# Patient Record
Sex: Female | Born: 1970 | Race: Black or African American | Hispanic: No | Marital: Single | Smoking: Never smoker
Health system: Southern US, Community
[De-identification: ages and names within clinical notes are randomized; demographics above are authoritative.]

## PROBLEM LIST (undated history)

## (undated) DIAGNOSIS — D869 Sarcoidosis, unspecified: Secondary | ICD-10-CM

## (undated) DIAGNOSIS — M797 Fibromyalgia: Secondary | ICD-10-CM

## (undated) DIAGNOSIS — D649 Anemia, unspecified: Secondary | ICD-10-CM

## (undated) DIAGNOSIS — I639 Cerebral infarction, unspecified: Secondary | ICD-10-CM

## (undated) DIAGNOSIS — M1990 Unspecified osteoarthritis, unspecified site: Secondary | ICD-10-CM

## (undated) DIAGNOSIS — I4581 Long QT syndrome: Secondary | ICD-10-CM

## (undated) DIAGNOSIS — E1140 Type 2 diabetes mellitus with diabetic neuropathy, unspecified: Secondary | ICD-10-CM

## (undated) DIAGNOSIS — D259 Leiomyoma of uterus, unspecified: Secondary | ICD-10-CM

## (undated) DIAGNOSIS — M329 Systemic lupus erythematosus, unspecified: Secondary | ICD-10-CM

## (undated) DIAGNOSIS — IMO0002 Reserved for concepts with insufficient information to code with codable children: Secondary | ICD-10-CM

## (undated) DIAGNOSIS — K222 Esophageal obstruction: Secondary | ICD-10-CM

## (undated) DIAGNOSIS — I10 Essential (primary) hypertension: Secondary | ICD-10-CM

## (undated) DIAGNOSIS — K219 Gastro-esophageal reflux disease without esophagitis: Secondary | ICD-10-CM

## (undated) DIAGNOSIS — Z95810 Presence of automatic (implantable) cardiac defibrillator: Secondary | ICD-10-CM

## (undated) DIAGNOSIS — J40 Bronchitis, not specified as acute or chronic: Secondary | ICD-10-CM

## (undated) DIAGNOSIS — G40909 Epilepsy, unspecified, not intractable, without status epilepticus: Secondary | ICD-10-CM

## (undated) DIAGNOSIS — H5789 Other specified disorders of eye and adnexa: Secondary | ICD-10-CM

## (undated) DIAGNOSIS — T7840XA Allergy, unspecified, initial encounter: Secondary | ICD-10-CM

## (undated) DIAGNOSIS — K648 Other hemorrhoids: Secondary | ICD-10-CM

## (undated) DIAGNOSIS — R1033 Periumbilical pain: Secondary | ICD-10-CM

## (undated) DIAGNOSIS — F419 Anxiety disorder, unspecified: Secondary | ICD-10-CM

## (undated) DIAGNOSIS — E119 Type 2 diabetes mellitus without complications: Secondary | ICD-10-CM

## (undated) DIAGNOSIS — R002 Palpitations: Secondary | ICD-10-CM

## (undated) DIAGNOSIS — Z9289 Personal history of other medical treatment: Secondary | ICD-10-CM

## (undated) DIAGNOSIS — J449 Chronic obstructive pulmonary disease, unspecified: Secondary | ICD-10-CM

## (undated) DIAGNOSIS — C801 Malignant (primary) neoplasm, unspecified: Secondary | ICD-10-CM

## (undated) HISTORY — DX: Type 2 diabetes mellitus without complications: E11.9

## (undated) HISTORY — DX: Chronic obstructive pulmonary disease, unspecified: J44.9

## (undated) HISTORY — DX: Leiomyoma of uterus, unspecified: D25.9

## (undated) HISTORY — DX: Esophageal obstruction: K22.2

## (undated) HISTORY — PX: TUBAL LIGATION: SHX77

## (undated) HISTORY — DX: Essential (primary) hypertension: I10

## (undated) HISTORY — DX: Anxiety disorder, unspecified: F41.9

## (undated) HISTORY — DX: Fibromyalgia: M79.7

## (undated) HISTORY — PX: SALPINGECTOMY: SHX328

## (undated) HISTORY — DX: Epilepsy, unspecified, not intractable, without status epilepticus: G40.909

## (undated) HISTORY — DX: Gastro-esophageal reflux disease without esophagitis: K21.9

## (undated) HISTORY — DX: Sarcoidosis, unspecified: D86.9

## (undated) HISTORY — PX: OTHER SURGICAL HISTORY: SHX169

## (undated) HISTORY — DX: Bronchitis, not specified as acute or chronic: J40

## (undated) HISTORY — DX: Long QT syndrome: I45.81

## (undated) HISTORY — DX: Anemia, unspecified: D64.9

## (undated) HISTORY — DX: Reserved for concepts with insufficient information to code with codable children: IMO0002

## (undated) HISTORY — DX: Allergy, unspecified, initial encounter: T78.40XA

## (undated) HISTORY — DX: Systemic lupus erythematosus, unspecified: M32.9

## (undated) HISTORY — DX: Periumbilical pain: R10.33

## (undated) HISTORY — DX: Palpitations: R00.2

## (undated) HISTORY — PX: HAND SURGERY: SHX662

## (undated) HISTORY — PX: CERVIX REMOVAL: SHX592

## (undated) SURGERY — BRONCHOSCOPY, WITH FLUOROSCOPY
Anesthesia: Moderate Sedation | Laterality: Bilateral

---

## 1998-09-03 ENCOUNTER — Other Ambulatory Visit: Admission: RE | Admit: 1998-09-03 | Discharge: 1998-09-03 | Payer: Self-pay | Admitting: Obstetrics

## 2000-06-04 ENCOUNTER — Emergency Department (HOSPITAL_COMMUNITY): Admission: EM | Admit: 2000-06-04 | Discharge: 2000-06-04 | Payer: Self-pay | Admitting: Emergency Medicine

## 2001-06-14 ENCOUNTER — Encounter: Payer: Self-pay | Admitting: Obstetrics

## 2001-06-14 ENCOUNTER — Encounter: Admission: RE | Admit: 2001-06-14 | Discharge: 2001-06-14 | Payer: Self-pay | Admitting: Obstetrics

## 2002-02-27 ENCOUNTER — Ambulatory Visit (HOSPITAL_COMMUNITY): Admission: RE | Admit: 2002-02-27 | Discharge: 2002-02-27 | Payer: Self-pay | Admitting: Internal Medicine

## 2002-02-27 ENCOUNTER — Encounter: Payer: Self-pay | Admitting: Internal Medicine

## 2002-03-09 ENCOUNTER — Ambulatory Visit: Admission: RE | Admit: 2002-03-09 | Discharge: 2002-03-09 | Payer: Self-pay | Admitting: Critical Care Medicine

## 2002-03-09 ENCOUNTER — Encounter (INDEPENDENT_AMBULATORY_CARE_PROVIDER_SITE_OTHER): Payer: Self-pay | Admitting: *Deleted

## 2002-03-09 ENCOUNTER — Encounter (INDEPENDENT_AMBULATORY_CARE_PROVIDER_SITE_OTHER): Payer: Self-pay | Admitting: Specialist

## 2002-05-04 ENCOUNTER — Ambulatory Visit (HOSPITAL_COMMUNITY): Admission: RE | Admit: 2002-05-04 | Discharge: 2002-05-04 | Payer: Self-pay | Admitting: Critical Care Medicine

## 2002-05-04 ENCOUNTER — Encounter: Payer: Self-pay | Admitting: Critical Care Medicine

## 2003-04-05 ENCOUNTER — Encounter: Admission: RE | Admit: 2003-04-05 | Discharge: 2003-04-05 | Payer: Self-pay | Admitting: Obstetrics

## 2003-05-19 DIAGNOSIS — K219 Gastro-esophageal reflux disease without esophagitis: Secondary | ICD-10-CM

## 2003-05-19 HISTORY — DX: Gastro-esophageal reflux disease without esophagitis: K21.9

## 2003-06-08 ENCOUNTER — Ambulatory Visit (HOSPITAL_COMMUNITY): Admission: RE | Admit: 2003-06-08 | Discharge: 2003-06-08 | Payer: Self-pay | Admitting: Critical Care Medicine

## 2003-12-08 ENCOUNTER — Emergency Department (HOSPITAL_COMMUNITY): Admission: EM | Admit: 2003-12-08 | Discharge: 2003-12-08 | Payer: Self-pay | Admitting: Emergency Medicine

## 2004-02-05 ENCOUNTER — Encounter: Payer: Self-pay | Admitting: Gastroenterology

## 2004-02-05 DIAGNOSIS — K209 Esophagitis, unspecified without bleeding: Secondary | ICD-10-CM | POA: Insufficient documentation

## 2004-02-05 DIAGNOSIS — K222 Esophageal obstruction: Secondary | ICD-10-CM | POA: Insufficient documentation

## 2004-03-19 ENCOUNTER — Ambulatory Visit: Payer: Self-pay | Admitting: Critical Care Medicine

## 2004-03-20 ENCOUNTER — Ambulatory Visit: Payer: Self-pay | Admitting: Critical Care Medicine

## 2004-05-18 HISTORY — PX: OTHER SURGICAL HISTORY: SHX169

## 2004-10-06 ENCOUNTER — Ambulatory Visit: Payer: Self-pay | Admitting: Gastroenterology

## 2004-10-07 ENCOUNTER — Ambulatory Visit (HOSPITAL_COMMUNITY): Admission: RE | Admit: 2004-10-07 | Discharge: 2004-10-07 | Payer: Self-pay | Admitting: Gastroenterology

## 2004-12-08 ENCOUNTER — Ambulatory Visit: Payer: Self-pay | Admitting: Gastroenterology

## 2004-12-11 ENCOUNTER — Ambulatory Visit: Payer: Self-pay | Admitting: Gastroenterology

## 2005-01-05 ENCOUNTER — Ambulatory Visit: Payer: Self-pay | Admitting: Critical Care Medicine

## 2005-01-20 ENCOUNTER — Ambulatory Visit: Payer: Self-pay | Admitting: Gastroenterology

## 2005-02-09 ENCOUNTER — Emergency Department (HOSPITAL_COMMUNITY): Admission: EM | Admit: 2005-02-09 | Discharge: 2005-02-09 | Payer: Self-pay | Admitting: Emergency Medicine

## 2005-02-27 ENCOUNTER — Ambulatory Visit: Payer: Self-pay | Admitting: Internal Medicine

## 2005-03-17 ENCOUNTER — Ambulatory Visit: Payer: Self-pay | Admitting: Internal Medicine

## 2005-03-20 ENCOUNTER — Inpatient Hospital Stay (HOSPITAL_COMMUNITY): Admission: RE | Admit: 2005-03-20 | Discharge: 2005-03-21 | Payer: Self-pay | Admitting: Internal Medicine

## 2005-03-21 ENCOUNTER — Ambulatory Visit: Payer: Self-pay | Admitting: Cardiology

## 2005-04-15 ENCOUNTER — Ambulatory Visit: Payer: Self-pay

## 2005-05-19 ENCOUNTER — Ambulatory Visit (HOSPITAL_COMMUNITY): Admission: RE | Admit: 2005-05-19 | Discharge: 2005-05-19 | Payer: Self-pay | Admitting: Orthopedic Surgery

## 2005-06-05 ENCOUNTER — Ambulatory Visit: Payer: Self-pay | Admitting: Internal Medicine

## 2005-06-05 ENCOUNTER — Ambulatory Visit (HOSPITAL_COMMUNITY): Admission: RE | Admit: 2005-06-05 | Discharge: 2005-06-05 | Payer: Self-pay | Admitting: Internal Medicine

## 2005-06-24 ENCOUNTER — Ambulatory Visit (HOSPITAL_COMMUNITY): Admission: RE | Admit: 2005-06-24 | Discharge: 2005-06-24 | Payer: Self-pay | Admitting: Orthopedic Surgery

## 2005-07-07 ENCOUNTER — Ambulatory Visit: Payer: Self-pay | Admitting: Internal Medicine

## 2005-07-28 ENCOUNTER — Ambulatory Visit: Payer: Self-pay | Admitting: Pulmonary Disease

## 2005-08-20 ENCOUNTER — Ambulatory Visit: Payer: Self-pay | Admitting: Critical Care Medicine

## 2005-09-03 ENCOUNTER — Encounter: Admission: RE | Admit: 2005-09-03 | Discharge: 2005-12-02 | Payer: Self-pay | Admitting: Orthopedic Surgery

## 2005-09-08 ENCOUNTER — Encounter: Admission: RE | Admit: 2005-09-08 | Discharge: 2005-09-08 | Payer: Self-pay | Admitting: Internal Medicine

## 2005-09-21 ENCOUNTER — Ambulatory Visit: Payer: Self-pay | Admitting: Gastroenterology

## 2005-09-30 ENCOUNTER — Ambulatory Visit: Payer: Self-pay | Admitting: Critical Care Medicine

## 2005-10-07 ENCOUNTER — Encounter (INDEPENDENT_AMBULATORY_CARE_PROVIDER_SITE_OTHER): Payer: Self-pay | Admitting: *Deleted

## 2005-10-07 ENCOUNTER — Ambulatory Visit (HOSPITAL_COMMUNITY): Admission: RE | Admit: 2005-10-07 | Discharge: 2005-10-07 | Payer: Self-pay | Admitting: Obstetrics

## 2005-11-05 ENCOUNTER — Ambulatory Visit: Payer: Self-pay

## 2005-11-16 ENCOUNTER — Encounter: Admission: RE | Admit: 2005-11-16 | Discharge: 2005-11-16 | Payer: Self-pay | Admitting: Neurology

## 2005-11-25 ENCOUNTER — Ambulatory Visit: Payer: Self-pay | Admitting: Internal Medicine

## 2005-12-08 ENCOUNTER — Ambulatory Visit: Payer: Self-pay | Admitting: Critical Care Medicine

## 2006-02-09 ENCOUNTER — Emergency Department (HOSPITAL_COMMUNITY): Admission: EM | Admit: 2006-02-09 | Discharge: 2006-02-09 | Payer: Self-pay | Admitting: Podiatry

## 2006-02-11 ENCOUNTER — Ambulatory Visit: Payer: Self-pay | Admitting: Critical Care Medicine

## 2006-03-19 ENCOUNTER — Ambulatory Visit: Payer: Self-pay

## 2006-04-23 ENCOUNTER — Ambulatory Visit: Payer: Self-pay

## 2006-04-28 ENCOUNTER — Ambulatory Visit: Payer: Self-pay | Admitting: Internal Medicine

## 2006-05-20 ENCOUNTER — Ambulatory Visit: Payer: Self-pay | Admitting: Internal Medicine

## 2006-05-28 ENCOUNTER — Ambulatory Visit: Payer: Self-pay | Admitting: Critical Care Medicine

## 2006-05-31 ENCOUNTER — Ambulatory Visit: Payer: Self-pay | Admitting: Cardiology

## 2006-06-17 ENCOUNTER — Ambulatory Visit: Payer: Self-pay | Admitting: Internal Medicine

## 2006-06-24 ENCOUNTER — Ambulatory Visit: Payer: Self-pay | Admitting: Internal Medicine

## 2006-07-15 ENCOUNTER — Ambulatory Visit: Payer: Self-pay | Admitting: Gastroenterology

## 2006-07-15 ENCOUNTER — Emergency Department (HOSPITAL_COMMUNITY): Admission: EM | Admit: 2006-07-15 | Discharge: 2006-07-16 | Payer: Self-pay | Admitting: Emergency Medicine

## 2006-07-19 ENCOUNTER — Ambulatory Visit: Payer: Self-pay | Admitting: Cardiovascular Disease

## 2006-08-19 ENCOUNTER — Ambulatory Visit: Payer: Self-pay

## 2006-12-23 ENCOUNTER — Ambulatory Visit: Payer: Self-pay | Admitting: Internal Medicine

## 2007-01-10 ENCOUNTER — Ambulatory Visit: Payer: Self-pay | Admitting: Internal Medicine

## 2007-01-10 LAB — CONVERTED CEMR LAB
BUN: 4 mg/dL — ABNORMAL LOW (ref 6–23)
Basophils Absolute: 0.1 10*3/uL (ref 0.0–0.1)
CO2: 30 meq/L (ref 19–32)
GFR calc Af Amer: 146 mL/min
HCT: 36.9 % (ref 36.0–46.0)
Hemoglobin: 13 g/dL (ref 12.0–15.0)
Lymphocytes Relative: 50.7 % — ABNORMAL HIGH (ref 12.0–46.0)
MCHC: 35.2 g/dL (ref 30.0–36.0)
Monocytes Absolute: 0.6 10*3/uL (ref 0.2–0.7)
Monocytes Relative: 7.4 % (ref 3.0–11.0)
Neutro Abs: 3.3 10*3/uL (ref 1.4–7.7)
Neutrophils Relative %: 39.7 % — ABNORMAL LOW (ref 43.0–77.0)
Potassium: 3.5 meq/L (ref 3.5–5.1)
Sodium: 138 meq/L (ref 135–145)
aPTT: 27.3 s (ref 21.7–29.8)

## 2007-01-13 ENCOUNTER — Observation Stay (HOSPITAL_COMMUNITY): Admission: AD | Admit: 2007-01-13 | Discharge: 2007-01-15 | Payer: Self-pay | Admitting: Internal Medicine

## 2007-01-13 ENCOUNTER — Ambulatory Visit: Payer: Self-pay | Admitting: Internal Medicine

## 2007-01-31 ENCOUNTER — Ambulatory Visit: Payer: Self-pay

## 2007-03-15 DIAGNOSIS — K219 Gastro-esophageal reflux disease without esophagitis: Secondary | ICD-10-CM

## 2007-03-15 DIAGNOSIS — J45909 Unspecified asthma, uncomplicated: Secondary | ICD-10-CM | POA: Insufficient documentation

## 2007-03-15 DIAGNOSIS — E119 Type 2 diabetes mellitus without complications: Secondary | ICD-10-CM

## 2007-03-15 DIAGNOSIS — D860 Sarcoidosis of lung: Secondary | ICD-10-CM | POA: Insufficient documentation

## 2007-03-15 DIAGNOSIS — D869 Sarcoidosis, unspecified: Secondary | ICD-10-CM

## 2007-03-15 DIAGNOSIS — I10 Essential (primary) hypertension: Secondary | ICD-10-CM | POA: Insufficient documentation

## 2007-03-15 DIAGNOSIS — J309 Allergic rhinitis, unspecified: Secondary | ICD-10-CM | POA: Insufficient documentation

## 2007-03-15 DIAGNOSIS — I4581 Long QT syndrome: Secondary | ICD-10-CM

## 2007-03-15 DIAGNOSIS — J449 Chronic obstructive pulmonary disease, unspecified: Secondary | ICD-10-CM | POA: Insufficient documentation

## 2007-03-15 DIAGNOSIS — I152 Hypertension secondary to endocrine disorders: Secondary | ICD-10-CM | POA: Insufficient documentation

## 2007-03-29 ENCOUNTER — Ambulatory Visit: Payer: Self-pay | Admitting: Internal Medicine

## 2007-04-18 ENCOUNTER — Ambulatory Visit: Payer: Self-pay | Admitting: Internal Medicine

## 2007-07-08 ENCOUNTER — Ambulatory Visit: Payer: Self-pay | Admitting: Critical Care Medicine

## 2007-07-11 ENCOUNTER — Telehealth: Payer: Self-pay | Admitting: Critical Care Medicine

## 2008-02-07 ENCOUNTER — Telehealth: Payer: Self-pay | Admitting: Gastroenterology

## 2008-02-08 ENCOUNTER — Ambulatory Visit: Payer: Self-pay | Admitting: Gastroenterology

## 2008-02-08 DIAGNOSIS — K5900 Constipation, unspecified: Secondary | ICD-10-CM | POA: Insufficient documentation

## 2008-02-08 DIAGNOSIS — R1033 Periumbilical pain: Secondary | ICD-10-CM | POA: Insufficient documentation

## 2008-02-08 HISTORY — DX: Constipation, unspecified: K59.00

## 2008-02-09 ENCOUNTER — Ambulatory Visit (HOSPITAL_COMMUNITY): Admission: RE | Admit: 2008-02-09 | Discharge: 2008-02-09 | Payer: Self-pay | Admitting: Gastroenterology

## 2008-02-09 ENCOUNTER — Ambulatory Visit: Payer: Self-pay | Admitting: Gastroenterology

## 2008-02-17 ENCOUNTER — Ambulatory Visit: Payer: Self-pay | Admitting: Internal Medicine

## 2008-02-28 ENCOUNTER — Telehealth: Payer: Self-pay | Admitting: Gastroenterology

## 2008-03-12 ENCOUNTER — Ambulatory Visit: Payer: Self-pay | Admitting: Gastroenterology

## 2008-04-05 ENCOUNTER — Telehealth: Payer: Self-pay | Admitting: Gastroenterology

## 2008-04-16 ENCOUNTER — Ambulatory Visit: Payer: Self-pay | Admitting: Internal Medicine

## 2008-05-18 DIAGNOSIS — K648 Other hemorrhoids: Secondary | ICD-10-CM

## 2008-05-18 HISTORY — DX: Other hemorrhoids: K64.8

## 2008-07-27 ENCOUNTER — Encounter: Payer: Self-pay | Admitting: Internal Medicine

## 2008-08-29 ENCOUNTER — Encounter: Payer: Self-pay | Admitting: Critical Care Medicine

## 2008-09-17 ENCOUNTER — Emergency Department (HOSPITAL_COMMUNITY): Admission: EM | Admit: 2008-09-17 | Discharge: 2008-09-17 | Payer: Self-pay | Admitting: Emergency Medicine

## 2008-09-26 ENCOUNTER — Ambulatory Visit: Payer: Self-pay | Admitting: Critical Care Medicine

## 2008-09-26 DIAGNOSIS — G40A09 Absence epileptic syndrome, not intractable, without status epilepticus: Secondary | ICD-10-CM | POA: Insufficient documentation

## 2008-10-01 ENCOUNTER — Telehealth: Payer: Self-pay | Admitting: Internal Medicine

## 2008-10-05 DIAGNOSIS — Z95810 Presence of automatic (implantable) cardiac defibrillator: Secondary | ICD-10-CM | POA: Insufficient documentation

## 2008-10-05 DIAGNOSIS — I4581 Long QT syndrome: Secondary | ICD-10-CM

## 2008-10-05 DIAGNOSIS — R002 Palpitations: Secondary | ICD-10-CM | POA: Insufficient documentation

## 2008-10-19 ENCOUNTER — Ambulatory Visit: Payer: Self-pay | Admitting: Critical Care Medicine

## 2008-10-19 ENCOUNTER — Encounter: Payer: Self-pay | Admitting: Critical Care Medicine

## 2008-11-15 ENCOUNTER — Ambulatory Visit: Payer: Self-pay | Admitting: Internal Medicine

## 2008-11-21 ENCOUNTER — Ambulatory Visit: Payer: Self-pay | Admitting: Critical Care Medicine

## 2008-12-09 ENCOUNTER — Emergency Department (HOSPITAL_COMMUNITY): Admission: EM | Admit: 2008-12-09 | Discharge: 2008-12-09 | Payer: Self-pay | Admitting: Emergency Medicine

## 2009-02-08 ENCOUNTER — Telehealth: Payer: Self-pay | Admitting: Gastroenterology

## 2009-02-13 ENCOUNTER — Telehealth: Payer: Self-pay | Admitting: Gastroenterology

## 2009-02-18 ENCOUNTER — Ambulatory Visit: Payer: Self-pay

## 2009-02-18 ENCOUNTER — Encounter: Payer: Self-pay | Admitting: Internal Medicine

## 2009-02-28 ENCOUNTER — Ambulatory Visit: Payer: Self-pay | Admitting: Internal Medicine

## 2009-03-01 ENCOUNTER — Observation Stay (HOSPITAL_COMMUNITY): Admission: EM | Admit: 2009-03-01 | Discharge: 2009-03-01 | Payer: Self-pay | Admitting: Emergency Medicine

## 2009-04-03 ENCOUNTER — Ambulatory Visit: Payer: Self-pay | Admitting: Internal Medicine

## 2009-04-18 ENCOUNTER — Ambulatory Visit: Payer: Self-pay | Admitting: Gastroenterology

## 2009-04-18 DIAGNOSIS — K625 Hemorrhage of anus and rectum: Secondary | ICD-10-CM | POA: Insufficient documentation

## 2009-04-24 ENCOUNTER — Ambulatory Visit (HOSPITAL_COMMUNITY): Admission: RE | Admit: 2009-04-24 | Discharge: 2009-04-24 | Payer: Self-pay | Admitting: Gastroenterology

## 2009-05-06 ENCOUNTER — Telehealth: Payer: Self-pay | Admitting: Gastroenterology

## 2009-05-14 ENCOUNTER — Ambulatory Visit: Payer: Self-pay | Admitting: Gastroenterology

## 2009-06-18 ENCOUNTER — Telehealth (INDEPENDENT_AMBULATORY_CARE_PROVIDER_SITE_OTHER): Payer: Self-pay | Admitting: *Deleted

## 2009-07-10 ENCOUNTER — Ambulatory Visit: Payer: Self-pay

## 2009-07-16 ENCOUNTER — Telehealth: Payer: Self-pay | Admitting: Internal Medicine

## 2009-08-21 ENCOUNTER — Telehealth: Payer: Self-pay | Admitting: Internal Medicine

## 2009-08-22 ENCOUNTER — Ambulatory Visit: Payer: Self-pay | Admitting: Internal Medicine

## 2009-08-22 DIAGNOSIS — R072 Precordial pain: Secondary | ICD-10-CM | POA: Insufficient documentation

## 2009-08-22 LAB — CONVERTED CEMR LAB
CO2: 29 meq/L (ref 19–32)
Calcium: 9.2 mg/dL (ref 8.4–10.5)
Creatinine, Ser: 0.6 mg/dL (ref 0.4–1.2)
GFR calc non Af Amer: 144 mL/min (ref >60)
Glucose, Bld: 245 mg/dL — ABNORMAL HIGH (ref 70–99)
Sodium: 137 meq/L (ref 135–145)

## 2009-08-26 ENCOUNTER — Telehealth (INDEPENDENT_AMBULATORY_CARE_PROVIDER_SITE_OTHER): Payer: Self-pay | Admitting: *Deleted

## 2009-08-27 ENCOUNTER — Ambulatory Visit: Payer: Self-pay | Admitting: Cardiology

## 2009-08-27 ENCOUNTER — Encounter (HOSPITAL_COMMUNITY): Admission: RE | Admit: 2009-08-27 | Discharge: 2009-10-16 | Payer: Self-pay | Admitting: Internal Medicine

## 2009-08-27 ENCOUNTER — Ambulatory Visit: Payer: Self-pay

## 2009-09-04 ENCOUNTER — Telehealth: Payer: Self-pay | Admitting: Internal Medicine

## 2009-10-17 ENCOUNTER — Encounter (INDEPENDENT_AMBULATORY_CARE_PROVIDER_SITE_OTHER): Payer: Self-pay | Admitting: *Deleted

## 2009-12-02 ENCOUNTER — Telehealth: Payer: Self-pay | Admitting: Critical Care Medicine

## 2009-12-17 ENCOUNTER — Ambulatory Visit: Payer: Self-pay | Admitting: Internal Medicine

## 2009-12-23 ENCOUNTER — Encounter: Payer: Self-pay | Admitting: Internal Medicine

## 2010-01-06 ENCOUNTER — Ambulatory Visit: Payer: Self-pay | Admitting: Internal Medicine

## 2010-01-06 LAB — CONVERTED CEMR LAB
BUN: 7 mg/dL (ref 6–23)
Basophils Relative: 0.8 % (ref 0.0–3.0)
Chloride: 103 meq/L (ref 96–112)
Creatinine, Ser: 0.6 mg/dL (ref 0.4–1.2)
Eosinophils Relative: 1.1 % (ref 0.0–5.0)
GFR calc non Af Amer: 143.71 mL/min (ref >60)
INR: 1 (ref 0.8–1.0)
Lymphocytes Relative: 42.6 % (ref 12.0–46.0)
MCV: 88.6 fL (ref 78.0–100.0)
Monocytes Absolute: 0.7 10*3/uL (ref 0.1–1.0)
Monocytes Relative: 7.1 % (ref 3.0–12.0)
Neutrophils Relative %: 48.4 % (ref 43.0–77.0)
Platelets: 267 10*3/uL (ref 150.0–400.0)
Prothrombin Time: 11 s (ref 9.7–11.8)
RBC: 4.63 M/uL (ref 3.87–5.11)
WBC: 9.9 10*3/uL (ref 4.5–10.5)
aPTT: 28.6 s (ref 21.7–28.8)
hCG, Beta Chain, Quant, S: 0.12 milliintl units/mL

## 2010-01-13 ENCOUNTER — Ambulatory Visit (HOSPITAL_COMMUNITY): Admission: RE | Admit: 2010-01-13 | Discharge: 2010-01-14 | Payer: Self-pay | Admitting: Internal Medicine

## 2010-01-13 ENCOUNTER — Ambulatory Visit: Payer: Self-pay | Admitting: Internal Medicine

## 2010-02-24 ENCOUNTER — Telehealth: Payer: Self-pay | Admitting: Gastroenterology

## 2010-02-24 ENCOUNTER — Ambulatory Visit: Payer: Self-pay | Admitting: Internal Medicine

## 2010-02-24 DIAGNOSIS — I471 Supraventricular tachycardia: Secondary | ICD-10-CM

## 2010-02-25 ENCOUNTER — Ambulatory Visit: Payer: Self-pay | Admitting: Gastroenterology

## 2010-02-25 DIAGNOSIS — K648 Other hemorrhoids: Secondary | ICD-10-CM | POA: Insufficient documentation

## 2010-03-11 ENCOUNTER — Ambulatory Visit: Payer: Self-pay | Admitting: Critical Care Medicine

## 2010-03-12 ENCOUNTER — Telehealth: Payer: Self-pay | Admitting: Critical Care Medicine

## 2010-03-18 ENCOUNTER — Ambulatory Visit (HOSPITAL_COMMUNITY): Admission: RE | Admit: 2010-03-18 | Discharge: 2010-03-18 | Payer: Self-pay | Admitting: Gastroenterology

## 2010-03-18 ENCOUNTER — Ambulatory Visit: Payer: Self-pay | Admitting: Gastroenterology

## 2010-03-18 HISTORY — PX: HEMORRHOID BANDING: SHX5850

## 2010-03-26 ENCOUNTER — Telehealth (INDEPENDENT_AMBULATORY_CARE_PROVIDER_SITE_OTHER): Payer: Self-pay | Admitting: *Deleted

## 2010-05-23 ENCOUNTER — Ambulatory Visit: Admit: 2010-05-23 | Payer: Self-pay | Admitting: Critical Care Medicine

## 2010-05-26 ENCOUNTER — Telehealth: Payer: Self-pay | Admitting: Critical Care Medicine

## 2010-05-26 ENCOUNTER — Ambulatory Visit: Admission: RE | Admit: 2010-05-26 | Discharge: 2010-05-26 | Payer: Self-pay | Source: Home / Self Care

## 2010-06-07 ENCOUNTER — Encounter: Payer: Self-pay | Admitting: Critical Care Medicine

## 2010-06-17 NOTE — Letter (Signed)
Summary: Diabetic Instructions  [facility]  [address] [place], [place] [address]   Phone: [phone]  Fax: [phone]    [name] 02-20-1971 MRN: [id number]   X    ORAL DIABETIC MEDICATION INSTRUCTIONS                                          ACTOS,AMARYL The day before your procedure:   Take your diabetic pill as you do normally  The day of your procedure:   Do not take your diabetic pill    We will check your blood sugar levels during the admission process and again in Recovery before discharging you home  ________________________________________________________________________  _  _   INSULIN (LONG ACTING) MEDICATION INSTRUCTIONS (Lantus, NPH, 70/30, Humulin, Novolin-N)   The day before your procedure:   Take  your regular evening dose    The day of your procedure:   Do not take your morning dose    _  _   INSULIN (SHORT ACTING) MEDICATION INSTRUCTIONS (Regular, Humulog, Novolog)   The day before your procedure:   Do not take your evening dose   The day of your procedure:   Do not take your morning dose   _  _   INSULIN PUMP MEDICATION INSTRUCTIONS  We will contact the physician managing your diabetic care for written dosage instructions for the day before your procedure and the day of your procedure.  Once we have received the instructions, we will contact you.

## 2010-06-17 NOTE — Assessment & Plan Note (Signed)
Summary: Pulmonary OV   Primary Provider/Referring Provider:  Judi Cong MD  CC:  Follow up.  Last seen 11/2008.  Having increased SOB "all the time, "  prod cough, wheezing, chest tightness, and nasal congestion x 2 wks.  Mucus started as green but is now clear.  Marland Kitchen  History of Present Illness: This is a 40 year old, African-American female with history of sarcoidosis and associated asthmatic bronchitis.  The patient also had chronic chest pain has been thoroughly evaluated by cardiology.  She does have a history of long QT syndrome and has an ICD placed in 2006.     11/21/08 Pt doing better with less cough and weakness.  Not as dyspneic.  Not as much mucous.  No wheeze.  Arm pain and numbness better. Neurology not impressed and did not do MRI due to fact pt has AICD. Pt denies any significant sore throat, nasal congestion or excess secretions, fever, chills, sweats, unintended weight loss, pleurtic or exertional chest pain, orthopnea PND, or leg swelling  March 11, 2010 2:06 PM Symptoms off and on for two weeks. getting progressively worse Cough is dry.   Notes some pndrip.  Clears throat alot,  severe hoarseness.  No f/c/s.  Asthma History    Asthma Control Assessment:    Age range: 12+ years    Symptoms: throughout the day    Nighttime Awakenings: 1-3/week    Interferes w/ normal activity: some limitations    SABA use (not for EIB): several times per day    ATAQ questionnaire: 3-4    FEV1: 2.85 liters (today)    FEV1 Pred: 3.11 liters (today)    Exacerbations requiring oral systemic steroids: 0-1/year    Asthma Control Assessment: Very Poorly Controlled   Current Medications (verified): 1)  Actos 15 Mg  Tabs (Pioglitazone Hcl) .... One  By Mouth Two Times A Day 2)  Atenolol 50 Mg Tabs (Atenolol) .... Take One Tablet By Mouth Two Times A Day 3)  Restasis 0.05 % Emul (Cyclosporine) .... Use As Directed 4)  Amitiza 24 Mcg Caps (Lubiprostone) .Marland Kitchen.. 1 Capsule Two Times A Day As  Needed 5)  Amaryl 2 Mg Tabs (Glimepiride) .Marland Kitchen.. 1 By Mouth Two Times A Day 6)  Patanol 0.1 % Soln (Olopatadine Hcl) .Marland Kitchen.. 1 Drop in Each Eye Daily 7)  Lotemax 0.5 % Susp (Loteprednol Etabonate) .Marland Kitchen.. 1 Drop in Each Eye Daily 8)  Flexeril 10 Mg Tabs (Cyclobenzaprine Hcl) .Marland Kitchen.. 1 By Mouth Daily As Needed 9)  Ventolin Hfa 108 (90 Base) Mcg/act Aers (Albuterol Sulfate) .... Inhale 1-2 Puffs Every 4 Hours As Needed 10)  Prilosec 20 Mg Cpdr (Omeprazole) .... Two Times A Day 11)  Cetirizine Hcl 10 Mg Tabs (Cetirizine Hcl) .... Take One By Mouth As Needed 12)  Lyrica 50 Mg Caps (Pregabalin) .... Take One By Mouth Once Daily  Allergies (verified): 1)  ! Codeine 2)  ! Demerol 3)  ! * Ivp Dye  Past History:  Past medical, surgical, family and social histories (including risk factors) reviewed, and no changes noted (except as noted below).  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 tubaligation 1998  Defibrillator Hand surgery x 2 SVT ablation  Family History: Reviewed history from 02/25/2010 and no changes required. non contrib Family History of Colon Cancer:Maternal grandfather, great uncle Family History of Diabetes: paternal grandmother, cousins and aunts Family History of Sudden Cardiac Death:  Paternal grandmother cancer unknown type Family History of Colon Polyps:maternal  grandfather  Social History: Reviewed history from 02/25/2010 and no changes required. Patient never smoked.  disabled Alcohol Use - yes rare Daily Caffeine Use i can soda a day Illicit Drug Use - no  Review of Systems       The patient complains of shortness of breath with activity, shortness of breath at rest, non-productive cough, and nasal congestion/difficulty breathing through nose.  The patient denies productive cough, coughing up blood, chest pain, irregular heartbeats, acid heartburn, indigestion, loss of appetite, weight change, abdominal pain, difficulty swallowing, sore throat, tooth/dental problems, headaches, sneezing, itching, ear ache, anxiety, depression, hand/feet swelling, joint stiffness or pain, rash, change in color of mucus, and fever.    Vital Signs:  Patient profile:   40 year old female Height:      67 inches Weight:      202 pounds BMI:     31.75 O2 Sat:      97 % on Room air Temp:     98.4 degrees F oral Pulse rate:   78 / minute BP sitting:   144 / 90  (left arm) Cuff size:   regular  Vitals Entered By: Gweneth Dimitri RN (March 11, 2010 2:01 PM)  O2 Flow:  Room air CC: Follow up.  Last seen 11/2008.  Having increased SOB "all the time,"  prod cough, wheezing, chest tightness, nasal congestion x 2 wks.  Mucus started as green but is now clear.   Comments Medications reviewed with patient Daytime contact number verified with patient. Gweneth Dimitri RN  March 11, 2010 1:58 PM    Physical Exam  Additional Exam:  Gen: Pleasant, well-nourished, in no distress , normal affect ENT: no lesions, no post nasal drip Neck: No JVD, no TMG, no carotid bruits Lungs: No use of accessory muscles, no dullness to percussion, prominent pseudowheeze, poor airflow Cardiovascular: RRR, heart sounds normal, no murmurs or gallops, no peripheral edema Abdomen: soft and non-tender, no HSM, BS normal Musculoskeletal: No deformities, no cyanosis or clubbing Neuro: alert,  non-focal,  no giveaway weakness in LUE or LLE.  sensation intact.      Pre-Spirometry FEV1    Value: 2.85 L     Pred: 3.11 L     Impression & Recommendations:  Problem # 1:  ASTHMA (ICD-493.90) Assessment Deteriorated moderate to severe persistent asthma , GERD and scute rhinitis are a flar  plan Prednisone 10mg  4 each am x3days, 3 x 3days, 2 x 3days, 1 x 3days then stop Dexilant one daily until samples gone then resume prilosec Tramadol one 4 times daily until bottle empty then start tessalon perles one to two every 6 hours for cough reflux diet nasonex two sprays each nostril daily Return 1 month  Medications Added to Medication List This Visit: 1)  Ventolin Hfa 108 (90 Base) Mcg/act Aers (Albuterol sulfate) .... Inhale 1-2 puffs every 4 hours as needed 2)  Prilosec 20 Mg Cpdr (Omeprazole) .... Two times a day hold until dexilant gone then resume 3)  Dexilant 60 Mg Cpdr (Dexlansoprazole) .... Take one by mouth daily until samples gone then resume prilosec 4)  Prednisone 10 Mg Tabs (Prednisone) .... Take as directed 4 each  am x3days, 3 x 3days, 2 x 3days, 1 x 3days then stop 5)  Tessalon Perles 100 Mg Caps (Benzonatate) .... One to two by mouth 3-4 times daily generic please 6)  Tramadol Hcl 50 Mg Tabs (Tramadol hcl) .... One to two by mouth every 4-6 hours as needed cough 7)  Nasonex 50 Mcg/act Susp (Mometasone furoate) .... Two puffs each nostril daily  Complete Medication List: 1)  Actos 15 Mg Tabs (Pioglitazone hcl) .... One  by mouth two times a day 2)  Atenolol 50 Mg Tabs (Atenolol) .... Take one tablet by mouth two times a day 3)  Restasis 0.05 % Emul (Cyclosporine) .... Use as directed 4)  Amitiza 24 Mcg Caps (Lubiprostone) .Marland Kitchen.. 1 capsule two times a day as needed 5)  Amaryl 2 Mg Tabs (Glimepiride) .Marland Kitchen.. 1 by mouth two times a day 6)  Patanol 0.1 % Soln (Olopatadine hcl) .Marland Kitchen.. 1 drop in each eye daily 7)  Lotemax 0.5 % Susp (Loteprednol etabonate) .Marland Kitchen.. 1 drop in each eye  daily 8)  Flexeril 10 Mg Tabs (Cyclobenzaprine hcl) .Marland Kitchen.. 1 by mouth daily as needed 9)  Ventolin Hfa 108 (90 Base) Mcg/act Aers (Albuterol sulfate) .... Inhale 1-2 puffs every 4 hours as needed 10)  Prilosec 20 Mg Cpdr (Omeprazole) .... Two times a day hold until dexilant gone then resume 11)  Cetirizine Hcl 10 Mg Tabs (Cetirizine hcl) .... Take one by mouth as needed 12)  Lyrica 50 Mg Caps (Pregabalin) .... Take one by mouth once daily 13)  Dexilant 60 Mg Cpdr (Dexlansoprazole) .... Take one by mouth daily until samples gone then resume prilosec 14)  Prednisone 10 Mg Tabs (Prednisone) .... Take as directed 4 each am x3days, 3 x 3days, 2 x 3days, 1 x 3days then stop 15)  Tessalon Perles 100 Mg Caps (Benzonatate) .... One to two by mouth 3-4 times daily generic please 16)  Tramadol Hcl 50 Mg Tabs (Tramadol hcl) .... One to two by mouth every 4-6 hours as needed cough 17)  Nasonex 50 Mcg/act Susp (Mometasone furoate) .... Two puffs each nostril daily  Other Orders: Est. Patient Level IV ([address])  Patient Instructions: 1)  Prednisone 10mg  4 each am x3days, 3 x 3days, 2 x 3days, 1 x 3days then stop 2)  Dexilant one daily until samples gone then resume prilosec 3)  Tramadol one 4 times daily until bottle empty then start tessalon perles one to two every 6 hours for cough 4)  reflux diet 5)  nasonex two sprays each nostril daily 6)  Return 1 month Prescriptions: NASONEX 50 MCG/ACT  SUSP (MOMETASONE FUROATE) Two puffs each nostril daily  #1 x 1   Entered and Authorized by:   Storm Frisk MD   Signed by:   Storm Frisk MD on 03/11/2010   Method used:   Electronically to        [facility] [address] [phone]* (retail)       [address]       [place], [place]  [address]       Ph: [phone]       Fax: [phone]   RxID:   [id number] TRAMADOL HCL 50 MG  TABS (TRAMADOL HCL) One to two by mouth every 4-6 hours as needed cough  #60 x 0   Entered and Authorized by:   Storm Frisk  MD   Signed by:   Storm Frisk MD on 03/11/2010   Method used:   Print then  Give to Patient   RxID:   [id number] TESSALON PERLES 100 MG  CAPS (BENZONATATE) One to two by mouth 3-4 times daily generic please  #90 x 0   Entered and Authorized by:   Storm Frisk MD   Signed by:   Storm Frisk MD on 03/11/2010   Method used:   Electronically to        [facility] [address] [phone]* (retail)       [address]       [place], [place]  [address]       Ph: [phone]       Fax: [phone]   RxID:   [id number] PREDNISONE 10 MG  TABS (PREDNISONE) Take as directed 4 each am x3days, 3 x 3days, 2 x 3days, 1 x 3days then stop  #30 x 0   Entered and Authorized by:   Storm Frisk MD   Signed by:   Storm Frisk MD on 03/11/2010   Method used:   Electronically to        [facility] [address] [phone]* (retail)       [address]       [place], [place]  [address]       Ph: [phone]       Fax: [phone]   RxID:   [id number]      Appended Document: Pulmonary OV fax ed Lenna Sciara

## 2010-06-17 NOTE — Assessment & Plan Note (Signed)
Summary: Cardiology Nuclear Study  Nuclear Med Background Indications for Stress Test: Evaluation for Ischemia   History: Asthma, COPD, Defibrillator, Echo, Myocardial Perfusion Study  History Comments: '03 Echo:normal LVF: 04 MPS:no ischemia, EF=72%; '08 ICD for prolonged QT; h/o sarcoidosis  Symptoms: Chest Pressure, Diaphoresis, Dizziness, Fatigue, Nausea, Near Syncope, Palpitations, Rapid HR, Syncope  Symptoms Comments: Last episode of VF:IEPPIRJJ night, 08/24/09. Last episode of syncope:2 months ago.   Nuclear Pre-Procedure Cardiac Risk Factors: Family History - CAD, Hypertension, NIDDM Caffeine/Decaff Intake: none NPO After: 8:00 PM Lungs: Clear.  Albuterol inhaler used prior to exercise. IV 0.9% NS with Angio Cath: 20g     IV Site: (R) AC IV Started by: Stanton Kidney EMT-P Chest Size (in) 36     Cup Size B     Height (in): 67 Weight (lb): 200 BMI: 31.44 Tech Comments: Atenolol held approximately 12 hours., per patient.  Patient was instructed to continue atenolol.  Nuclear Med Study 1 or 2 day study:  1 day     Stress Test Type:  Stress Reading MD:  Marca Ancona, MD     Referring MD:  Lewayne Bunting, MD Resting Radionuclide:  Technetium 69m Tetrofosmin     Resting Radionuclide Dose:  10.8 mCi  Stress Radionuclide:  Technetium 34m Tetrofosmin     Stress Radionuclide Dose:  33.0 mCi   Stress Protocol Exercise Time (min):  7:00 min     Max HR:  150 bpm     Predicted Max HR:  183 bpm  Max Systolic BP: 179 mm Hg     Percent Max HR:  81.97 %     METS: 7.1 Rate Pressure Product:  88416    Stress Test Technologist:  Rea [employer] CMA-N     Nuclear Technologist:  Domenic Polite CNMT  Rest Procedure  Myocardial perfusion imaging was performed at rest 45 minutes following the intravenous administration of Myoview Technetium 81m Tetrofosmin.  Stress Procedure  The patient exercised for seven minutes.  The patient stopped due to fatigue.  She c/o chest pressure, 6/10, with  exercise; that was relieved immediately with rest.  There were no diagnostic ST-T wave changes.  There was a hypertensive response to exercise, 172/103.  Myoview was injected at peak exercise and myocardial perfusion imaging was performed after a brief delay.  QPS Raw Data Images:  Normal; no motion artifact; normal heart/lung ratio. Stress Images:  NI: Uniform and normal uptake of tracer in all myocardial segments. Rest Images:  Normal homogeneous uptake in all areas of the myocardium. Subtraction (SDS):  There is no evidence of scar or ischemia. Transient Ischemic Dilatation:  .61  (Normal <1.22)  Lung/Heart Ratio:  .19  (Normal <0.45)  Quantitative Gated Spect Images QGS EDV:  49 ml QGS ESV:  10 ml QGS EF:  79 % QGS cine images:  Count-poor images so significant artifact.  EF appears normal but individual wall segments cannot be commented upon.    Overall Impression  Exercise Capacity: Fair exercise capacity. BP Response: Hypertensive blood pressure response. Clinical Symptoms: Chest pain.  ECG Impression: No significant ST segment change suggestive of ischemia. Overall Impression: Normal stress nuclear study. Overall Impression Comments: Probably normal EF but count-poor study so lots of artifact on gated images.

## 2010-06-17 NOTE — Assessment & Plan Note (Signed)
Summary: ADD ON PER KELLY/ELVE. HEART/X2/SAF   Visit Type:  Follow-up Primary Provider:  Judi Cong MD   History of Present Illness: [name] returns today for followup of her palpitations and c/p.  I saw her several months ago and she was experiencing palpitations that sounded like she was experiencing SVT.  We decided on a period of watchful waiting and she returns for followup.  She had an episode several days ago where she was in  the back of her car and her heart began racing.  She did not pass out but did note that she felt like she might.  The  episode lasted about 15 minutes and stopped suddenly.  She notes fatigue after the episode.    Current Medications (verified): 1)  Actos 15 Mg  Tabs (Pioglitazone Hcl) .... One  By Mouth Once Daily 2)  Atenolol 50 Mg Tabs (Atenolol) .... Take One Tablet By Mouth Two Times A Day 3)  Restasis 0.05 % Emul (Cyclosporine) .... Use As Directed 4)  Amitiza 24 Mcg Caps (Lubiprostone) .Marland Kitchen.. 1 Capsule Two Times A Day As Needed 5)  Amaryl 2 Mg Tabs (Glimepiride) .Marland Kitchen.. 1 By Mouth Daily 6)  Patanol 0.1 % Soln (Olopatadine Hcl) .Marland Kitchen.. 1 Drop in Each Eye Daily 7)  Lotemax 0.5 % Susp (Loteprednol Etabonate) .Marland Kitchen.. 1 Drop in Each Eye Daily 8)  Aciphex 20 Mg Tbec (Rabeprazole Sodium) .... Take 1 Tablet By Mouth Two Times A Day 9)  Flexeril 10 Mg Tabs (Cyclobenzaprine Hcl) .Marland Kitchen.. 1 By Mouth Daily As Needed 10)  Ventolin Hfa 108 (90 Base) Mcg/act Aers (Albuterol Sulfate) .... Inhale 1-2 Puffs Every 4 Hours As Needed Needs Appt With Dr. Delford Field!!!  Allergies: 1)  ! Codeine 2)  ! Demerol 3)  ! 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    Vital Signs:  Patient profile:   40 year old female Height:      67 inches Weight:      196 pounds Pulse rate:   81 / minute BP sitting:   122 / 90  (left arm)  Vitals Entered By: Laurance Flatten CMA (December 17, 2009 2:10 PM)  Physical Exam  General:  Well developed, well nourished, in no acute distress.  HEENT: normal Neck: supple. No JVD. Carotids 2+ bilaterally no bruits Cor: RRR no rubs, gallops or murmur Lungs: CTA.  A well healed ICD incision is present. Ab: soft, nontender. nondistended. No HSM. Good bowel sounds Ext: warm. no cyanosis, clubbing or edema Neuro: alert and oriented. Grossly nonfocal. affect pleasant     ICD Specifications Following MD:  Lewayne Bunting, MD     ICD Vendor:  [employer]     ICD Model Number:  7232     ICD Serial Number:  [id number] ICD DOI:  01/13/2007     ICD Implanting MD:  Lewayne Bunting, MD  Lead 1:    Location: RV     DOI: 01/13/2007  Model #: [id number]     Serial #: [id number]     Status: active  Indications::  LONG QT   ICD Follow Up Battery Voltage:  3.13 V     Charge Time:  7.61 seconds     Underlying rhythm:  SR ICD Dependent:  No       ICD Device Measurements Right Ventricle:  Amplitude: 8.3 mV, Impedance: 488 ohms, Threshold: 1.5 V at 0.7 msec Shock Impedance: 48/62 ohms   Episodes MS Episodes:  0     Percent Mode Switch:  0     Coumadin:  No Shock:  0     ATP:  0     Nonsustained:  8      Brady Parameters Mode VVI     Lower Rate Limit:  40      Tachy Zones VF:  200      VT:  240     VT1:  162 (MONITOR)     Next Cardiology Appt Due:  03/18/2010 Tech Comments:  4 ?VT EPISODES ALL IN MONITOR ZONE.  PT COULD FEEL EPISODE BECAME DIZZY AND SWEATY.  NORMAL DEVICE FUNCTION.  NO CHANGES MADE.  TALKED W/PT ABOUT CARELINK AND WANTS TO THINK ABOUT IT AT THIS TIME.  ROV IN 3 MTHS W/CLINIC.  [name]  December 17, 2009 2:27 PM MD Comments:  Agree with above.  Impression & Recommendations:  Problem # 1:  AUTOMATIC IMPLANTABLE CARDIAC DEFIBRILLATOR SITU (ICD-V45.02) Her device is working normally.  Review of her strips suggests that her episodes of tachycardia are most likely SVT.  I have discussed the treatment options with the patient and she wishes to proceed with EP study and catheter ablation.  The risks/benefits/goals/expectations and she wishes to proceed.  Problem # 2:  SYNDROME, LONG QT (ICD-426.82) She will continue on with her beta blocker until we proceed with ablation. Her updated medication list for this problem includes:    Atenolol 50 Mg Tabs (Atenolol) .Marland Kitchen... Take one tablet by mouth two times a day

## 2010-06-17 NOTE — Letter (Signed)
Summary: [facility]  [address] [place], [place] [address]   Phone: [phone]  Fax: [phone]       [name]    1970-07-14    MRN: [id number]        Procedure Day /Date:TUESDAY 03/18/2010     Arrival Time:8AM      Procedure Time:9AM     Location of Procedure:                     X   [facility] ( Outpatient Registration)      PREPARATION FOR FLEXIBLE SIGMOIDOSCOPY WITH MAGNESIUM CITRATE  Prior to the day before your procedure, purchase one 8 oz. bottle of Magnesium Citrate and one Fleet Enema from the laxative section of your drugstore.  _________________________________________________________________________________________________  THE DAY BEFORE YOUR PROCEDURE             DATE:10/31/2011DAY: MONDAY  1.   Have a clear liquid dinner the night before your procedure.  2.   Do not drink anything colored red or purple.  Avoid juices with pulp.  No orange juice.              CLEAR LIQUIDS INCLUDE: Water Jello Ice Popsicles Tea (sugar ok, no milk/cream) Powdered fruit flavored drinks Coffee (sugar ok, no milk/cream) Gatorade Juice: apple, white grape, white cranberry  Lemonade Clear bullion, consomm, broth Carbonated beverages (any kind) Strained chicken noodle soup Hard Candy   3.   At 7:00 pm the night before your procedure, drink one bottle of Magnesium Citrate over ice.  4.   Drink at least 3 more glasses of clear liquids before bedtime (preferably juices).  5.   Results are expected usually within 1 to 6 hours after taking the Magnesium Citrate.  ___________________________________________________________________________________________________  THE DAY OF YOUR PROCEDURE            DATE: TUESDAY   DAY: 03/18/2010  1.   Use Fleet Enema one hour prior to coming for procedure.  2.   You may drink clear liquids until 5AM(4 hours before exam)       MEDICATION INSTRUCTIONS  Unless otherwise  instructed, you should take regular prescription medications with a small sip of water as early as possible the morning of your procedure.  Diabetic patients - see separate instructions.           OTHER INSTRUCTIONS  You will need a responsible adult at least 39 years of age to accompany you and drive you home.   This person must remain in the waiting room during your procedure.  Wear loose fitting clothing that is easily removed.  Leave jewelry and other valuables at home.  However, you may wish to bring a book to read or an iPod/MP3 player to listen to music as you wait for your procedure to start.  Remove all body piercing jewelry and leave at home.  Total time from sign-in until discharge is approximately 2-3 hours.  You should go home directly after your procedure and rest.  You can resume normal activities the day after your procedure.  The day of your procedure you should not:   Drive   Make legal decisions   Operate machinery   Drink alcohol   Return to work  You will receive specific instructions about eating, activities and medications before you leave.   The above instructions have been reviewed and explained to me by   _______________________    I fully  understand and can verbalize these instructions _____________________________ Date _________

## 2010-06-17 NOTE — Procedures (Signed)
Summary: pc2   Current Medications (verified): 1)  Actos 15 Mg  Tabs (Pioglitazone Hcl) .... One  By Mouth Once Daily 2)  Atenolol 50 Mg Tabs (Atenolol) .... Take One Tablet By Mouth Two Times A Day 3)  Symbicort 160-4.5 Mcg/act  Aero (Budesonide-Formoterol Fumarate) .... Two Puff Two Times A Day 4)  Clonazepam 1 Mg  Tabs (Clonazepam) .... One Half Two Times A Day 5)  Hyoscyamine Sulfate 0.375 Mg  Tb12 (Hyoscyamine Sulfate) .... One By Mouth Two Times A Day 6)  Proair Hfa 108 (90 Base) Mcg/act  Aers (Albuterol Sulfate) .Marland Kitchen.. 1-2 Puffs Every 4-6 Hours As Needed 7)  Restasis 0.05 % Emul (Cyclosporine) .... Use As Directed 8)  Amitiza 24 Mcg Caps (Lubiprostone) .Marland Kitchen.. 1 Capsule Two Times A Day As Needed 9)  Amaryl 2 Mg Tabs (Glimepiride) .Marland Kitchen.. 1 By Mouth Daily 10)  Patanol 0.1 % Soln (Olopatadine Hcl) .Marland Kitchen.. 1 Drop in Each Eye Daily 11)  Lotemax 0.5 % Susp (Loteprednol Etabonate) .Marland Kitchen.. 1 Drop in Each Eye Daily 12)  Aciphex 20 Mg Tbec (Rabeprazole Sodium) .... Take 1 Tablet By Mouth Two Times A Day 13)  Flexeril 10 Mg Tabs (Cyclobenzaprine Hcl) .Marland Kitchen.. 1 By Mouth Daily As Needed 14)  Anusol-Hc 25 Mg  Supp (Hydrocortisone Acetate) .... Put One in Rectum Twice Daily For 7 Days. 15)  Prilosec 20 Mg Cpdr (Omeprazole) .... One By Mouth Two Times A Day  Allergies (verified): 1)  ! Codeine 2)  ! Demerol 3)  ! * Ivp Dye    ICD Specifications Following MD:  Lewayne Bunting, MD     ICD Vendor:  Medtronic     ICD Model Number:  7232     ICD Serial Number:  [id number] ICD DOI:  01/13/2007     ICD Implanting MD:  Lewayne Bunting, MD  Lead 1:    Location: RV     DOI: 01/13/2007     Model #: [id number]     Serial #: [id number]     Status: active  Indications::  LONG QT   ICD Follow Up Remote Check?  No Battery Voltage:  3.14 V     Charge Time:  7.49 seconds     Underlying rhythm:  SR@90  ICD Dependent:  No       ICD Device Measurements Right Ventricle:  Amplitude: 8.4 mV, Impedance: 488 ohms, Threshold: 1.5 V at 0.5  msec Shock Impedance: 44/55 ohms   Episodes Coumadin:  No Shock:  0     ATP:  0     Nonsustained:  3     Ventricular Pacing:  0%  Brady Parameters Mode VVI     Lower Rate Limit:  40      Tachy Zones VF:  200     VT:  240     VT1:  162 (MONITOR)     Next Cardiology Appt Due:  09/15/2009 Tech Comments:  RV reprogrammed 3.0@0 .4.  VT1 episode was sinus tachy.  Ms. Milles continues to c/o pain and burning at the site.  No redness or edema noted.  Dr. Ladona Ridgel also spoke with Ms. Boyson today and checked the site.  RX for Percocet given today.  He also discussed the possibility of removing the device given that she has had intermittent pain and burning at the site since implant and has not had any therapy.  She will follow up in 3 months with Dr. Ladona Ridgel and they will discuss the options again along with possible genetic testing for  long QT documentation. Altha Harm, LPN  July 10, 2009 10:37 AM

## 2010-06-17 NOTE — Letter (Signed)
Summary: Appointment - Missed  [facility], Main Office  [address]   [place], [place] [address]   Phone: [phone]  Fax: [phone]     October 17, 2009 MRN: [id number]   [name] [address] [place], [place]  [address]   Dear Ms. Haye,  Our records indicate you missed your appointment on 10/01/2009 with Dr Ladona Ridgel. It is very important that we reach you to reschedule this appointment. We look forward to participating in your health care needs. Please contact us at the number listed above at your earliest convenience to reschedule this appointment.     Sincerely,   Ruel Favors Scheduling Team

## 2010-06-17 NOTE — Procedures (Signed)
Summary: Flexible Sigmoidoscopy  Patient: [name] Note: All result statuses are Final unless otherwise noted.  Tests: (1) Flexible Sigmoidoscopy (FLX)  FLX Flexible Sigmoidoscopy                             DONE     [facility]     [address] [place], [place]  [address]           FLEXIBLE SIGMOIDOSCOPY PROCEDURE REPORT           PATIENT:  [name], [name]  MR#:  [id number]     BIRTHDATE:  07-Jan-1971, 39 yrs. old  GENDER:  female           ENDOSCOPIST:  Barbette Hair. Arlyce Dice, MD     Referred by:           PROCEDURE DATE:  03/18/2010     PROCEDURE:  Flexible Sigmoidoscopy with banding     ASA CLASS:  Class II     INDICATIONS:  treatment of hemorrhoids           MEDICATIONS:   Fentanyl 100 mcg IV, Versed 10 mg, Benadryl 25 mg     IV           DESCRIPTION OF PROCEDURE:   After the risks benefits and     alternatives of the procedure were thoroughly explained, informed     consent was obtained.  Digital rectal exam was performed and     revealed no abnormalities.   The  endoscope was introduced through     the anus and advanced to the sigmoid colon, without limitations.     The quality of the prep was .  The instrument was then slowly     withdrawn as the mucosa was fully examined.           Internal hemorrhoids were found. Friable internal hemorrhoids. 6     bands were placed circumferentially above the dentate line  normal     sigmoid.   Retroflexed views in the rectum revealed medium     hemorrhoids.    The scope was then withdrawn from the patient and     the procedure terminated.           COMPLICATIONS:  None           ENDOSCOPIC IMPRESSION:     1) Internal hemorrhoids - s/p band ligation     2) Normal sigmoid           RECOMMENDATIONS:     1) continue current meds     2) Call the office to schedule a followup office visit for 4     weeks           REPEAT EXAM:  No           ______________________________     Barbette Hair. Arlyce Dice, MD        CC:  Fleet Contras, MD           n.     Rosalie DoctorBarbette Hair. Kaplan at 03/18/2010 09:45 AM           Roxine Caddy, [id number]  Note: An exclamation mark (!) indicates a result that was not dispersed into the flowsheet. Document Creation Date: 03/18/2010 9:46 AM _______________________________________________________________________  (1) Order result status: Final Collection or observation date-time: 03/18/2010 09:42 Requested date-time:  Receipt date-time:  Reported date-time:  Referring  Physician:   Ordering Physician: Melvia Heaps [id number]) Specimen Source:  Source: Launa Grill Order Number: [id number] Lab site:

## 2010-06-17 NOTE — Assessment & Plan Note (Signed)
Summary: racing heart and chest pain   Visit Type:  Follow-up Primary Provider:  Judi Cong MD   History of Present Illness: Ms. Freund returns today for followup of her palpitations and c/p.  I saw her several months ago and she was experiencing palpitations that sounded like she was experiencing SVT.  We decided on a period of watchful waiting and she returns for followup.  She has had rare palpitations and no extended racing since we last saw her.    She notes fatigue.  Also, she had a GI illness two weeks ago.  She c/o c/p radiating to her left arm, not necessarily associated with exertion.  Current Medications (verified): 1)  Actos 15 Mg  Tabs (Pioglitazone Hcl) .... One  By Mouth Once Daily 2)  Atenolol 50 Mg Tabs (Atenolol) .... Take One Tablet By Mouth Two Times A Day 3)  Symbicort 160-4.5 Mcg/act  Aero (Budesonide-Formoterol Fumarate) .... Two Puff Two Times A Day 4)  Clonazepam 1 Mg  Tabs (Clonazepam) .... One Half Two Times A Day 5)  Hyoscyamine Sulfate 0.375 Mg  Tb12 (Hyoscyamine Sulfate) .... One By Mouth Two Times A Day 6)  Proair Hfa 108 (90 Base) Mcg/act  Aers (Albuterol Sulfate) .Marland Kitchen.. 1-2 Puffs Every 4-6 Hours As Needed 7)  Restasis 0.05 % Emul (Cyclosporine) .... Use As Directed 8)  Amitiza 24 Mcg Caps (Lubiprostone) .Marland Kitchen.. 1 Capsule Two Times A Day As Needed 9)  Amaryl 2 Mg Tabs (Glimepiride) .Marland Kitchen.. 1 By Mouth Daily 10)  Patanol 0.1 % Soln (Olopatadine Hcl) .Marland Kitchen.. 1 Drop in Each Eye Daily 11)  Lotemax 0.5 % Susp (Loteprednol Etabonate) .Marland Kitchen.. 1 Drop in Each Eye Daily 12)  Aciphex 20 Mg Tbec (Rabeprazole Sodium) .... Take 1 Tablet By Mouth Two Times A Day 13)  Flexeril 10 Mg Tabs (Cyclobenzaprine Hcl) .Marland Kitchen.. 1 By Mouth Daily As Needed 14)  Prilosec 20 Mg Cpdr (Omeprazole) .... Prn 15)  Percocet .... As Needed  Allergies (verified): 1)  ! Codeine 2)  ! Demerol 3)  ! 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  The patient denies syncope, dyspnea on exertion, and peripheral edema.    Vital Signs:  Patient profile:   40 year old female Height:      67 inches Weight:      199 pounds BMI:     31.28 Pulse rate:   80 / minute BP sitting:   138 / 80  (left arm)  Vitals Entered By: Laurance Flatten CMA (August 22, 2009 12:17 PM)  Physical Exam  General:  Well developed, well nourished, in no acute distress.  HEENT: normal Neck: supple. No JVD. Carotids 2+ bilaterally no bruits Cor: RRR no rubs, gallops or murmur Lungs: CTA.  A well healed ICD incision is present. Ab: soft, nontender. nondistended. No HSM. Good bowel sounds Ext: warm. no cyanosis, clubbing or edema Neuro: alert and oriented. Grossly nonfocal. affect pleasant    EKG  Procedure date:  08/22/2009  Findings:      Normal sinus rhythm with rate of:  81. Marked QT prolongation.  The are non-specific STT changes.   ICD Specifications Following MD:  Lewayne Bunting, MD     ICD Vendor:  Medtronic     ICD Model Number:  7232     ICD Serial Number:  [id number] ICD DOI:  01/13/2007     ICD Implanting MD:  Lewayne Bunting, MD  Lead 1:    Location: RV     DOI: 01/13/2007     Model #: 5284     Serial #: [id number]     Status: active  Indications::  LONG QT   ICD Follow  Up Battery Voltage:  3.13 V     Charge Time:  7.61 seconds     Underlying rhythm:  NSR ICD Dependent:  No       ICD Device Measurements Right Ventricle:  Amplitude: 12 mV, Impedance: 472 ohms, Threshold: 1.5 V at 0.4 msec  Episodes Coumadin:  No Nonsustained:  5     Ventricular Pacing:  0  Brady Parameters Mode VVI     Lower Rate Limit:  40      Tachy Zones VF:  200     VT:  240     VT1:  162 (MONITOR)     MD Comments:  No ICD therapies.  She has SVT (likely sinus tachy) on her monitor.  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  I will check electrolytes to be sure that her recent GI illness did not lower her potassium or magnesium. Orders: TLB-BMP (Basic Metabolic Panel-BMET) (80048-METABOL) Nuclear Stress Test (Nuc Stress Test)  Problem # 2:  AUTOMATIC IMPLANTABLE CARDIAC DEFIBRILLATOR SITU (ICD-V45.02) Her device is working normally.  Continue followup.  Problem # 3:  PRECORDIAL PAIN (ICD-786.51) The etiology is unclear.  I have asked her to proceed with exercise myoview as she has c/p and an abnormal ECG with baseline STT changes. Her updated medication list for this problem includes:    Atenolol 50 Mg Tabs (Atenolol) .Marland Kitchen... Take one tablet by mouth two times a day  Orders: TLB-BMP (Basic Metabolic Panel-BMET) (80048-METABOL) Nuclear Stress Test (Nuc Stress Test)  Other Orders: EKG w/ Interpretation (93000)  Patient Instructions: 1)  Your physician recommends that you have lab work today: bmet (786.51) 2)  Your physician has requested that you have an exercise stress myoview.  For further information please visit [email or web address]  Please follow instruction sheet, as given.

## 2010-06-17 NOTE — Progress Notes (Signed)
Summary: med dosage  Phone Note Other Incoming Call back at [phone]   Caller: Jason/[facility] Summary of Call:  dosage question on percocet, pt is awaiting in store Initial call taken by: Migdalia Dk,  July 16, 2009 11:54 AM  Follow-up for Phone Call        left message for pharmacy to call back. Follow-up by: Altha Harm, LPN,  July 17, 2009 8:41 AM  Additional Follow-up for Phone Call Additional follow up Details #1::        Spoke with Dr. Ladona Ridgel for dosage clairfication and alsoJason @ [facility]@ pharmacy  1-2 tabs by mouth every 4-6hours as needed. Additional Follow-up by: Altha Harm, LPN,  July 17, 2009 12:47 PM

## 2010-06-17 NOTE — Letter (Signed)
Summary: External Correspondence  External Correspondence   Imported By: Lester Hayward 02/28/2010 08:23:30  _____________________________________________________________________  External Attachment:    Type:   Image     Comment:   External Document

## 2010-06-17 NOTE — Progress Notes (Signed)
Summary: prescript  Phone Note From Pharmacy Call back at [phone]   Caller: [facility]  [address] #[id number]* Call For: wright  Summary of Call: need to know if faxed on nexium  that was sent yesterday have been taken care of , need it by today Initial call taken by: Rickard Patience,  June 18, 2009 4:03 PM  Follow-up for Phone Call        receieved fax from [facility] yesterday and completed by PW this am. Form faxed back to [facility]. Ok per PW to subsitute nexium to omeprazole 40mg  once daily.  [facility], spoke with [place].  Per Toribio Harbour, they are being audited by medicaid.  They were not asking to change nexium to  omeprazole.  They need a reason why nexium is being used and this needs to be a medicaid specfic reason.  The reasons are attached on the face sheet  numbers 1-6.  Informed Toribio Harbour PW is not in the office until tomorrow morning.  She is requesting this to be done first thing tomorrow morning to meet the deadline.  Informed her I will have PW take care of this asap and fax it back once completed.  Will put forms in PW's to do folder and forward message to him as FYI.     Follow-up by: Gweneth Dimitri RN,  June 18, 2009 4:18 PM  Additional Follow-up for Phone Call Additional follow up Details #1::        Use reason number one. prior trial omeprazole failed  Additional Follow-up by: Storm Frisk MD,  June 18, 2009 4:29 PM    Additional Follow-up for Phone Call Additional follow up Details #2::    ok.  requesting for this to be handwritten by MD on the rx they faxed to [place].  Gweneth Dimitri RN  June 18, 2009 4:54 PM  Follow-up by: Storm Frisk MD,  June 18, 2009 4:56 PM  Additional Follow-up for Phone Call Additional follow up Details #3:: Details for Additional Follow-up Action Taken: this is done and in your to do folder  new rx printed and filled out PW as requested by pharmacy.  faxed back to [facility].  Gweneth Dimitri RN  June 18, 2009  5:13 PM   Additional Follow-up by: Storm Frisk MD,  June 18, 2009 4:56 PM  New/Updated Medications: NEXIUM 40 MG CPDR (ESOMEPRAZOLE MAGNESIUM) once daily Prescriptions: NEXIUM 40 MG CPDR (ESOMEPRAZOLE MAGNESIUM) once daily  #30 x 0   Entered by:   Gweneth Dimitri RN   Authorized by:   Storm Frisk MD   Signed by:   Gweneth Dimitri RN on 06/18/2009   Method used:   Printed then faxed to ...       [facility]  [address] [phone]* (retail)       [address]       [place], [place]  [address]       Ph: [phone]       Fax: [phone]   RxID:   [id number]

## 2010-06-17 NOTE — Letter (Signed)
Summary: ELectrophysiology/Ablation Procedure Instructions  [facility], Main Office  [address]   [place], [place] [address]   Phone: [phone]  Fax: [phone]     Electrophysiology/Ablation Procedure Instructions    You are scheduled for a(n) SVT ablation on 01/13/10 at 7:30am with Dr. Johney Frame.  Please come to the Short Stay Center at [facility] at 5:30am on the day of your procedure.  2.  Come prepared to stay overnight.   Please bring your insurance cards and a list of your medications.  3.  Come to the [place] office on 01/06/10 for lab work.  .  You do not have to be fasting.  4.  Do not have anything to eat or drink after midnight the night before your procedure.  5.  Do NOT take these medications for the morning of your procedure unless otherwise instructed: Actos and hold Atenolol 3 days prior.  All of your remaining medications may be taken with a small amount of water.  6.  Educational material received:   Ablation   * Occasionally, EP studies and ablations can become lengthy.  Please make your family aware of this before your procedure starts.  Average time ranges from 2-8 hours for EP studies/ablations.  Your physician will locate your family after the procedure with the results.  * If you have any questions after you get home, please call the office at [phone].  Anselm Pancoast

## 2010-06-17 NOTE — Assessment & Plan Note (Signed)
Summary: rectal bleeding/sheri   History of Present Illness Visit Type: Follow-up Visit Primary GI MD: Melvia Heaps MD [facility] Primary Makayela Secrest: Judi Cong MD Chief Complaint: Patient complains of rectal bleeding and rectal pain for three days. Patient states that she is having some constipation last BM was last Saturday, she also complains of lwer abdominal pain and bloating.  History of Present Illness:   Ms. Shindler is returning for evaluation of rectal bleeding.  She has a history of bleeding hemorrhoids diagnosed by sigmoidoscopy almost one year ago.  She has taken  various suppositories in the past.  Of late she has had frequent bright red blood per rectum when she passes gas or has a bowel movement.  She suffers from moderate chronic constipation and does strain at stool.  Colonoscopy in 2006 was normal.   GI Review of Systems    Reports abdominal pain and  bloating.     Location of  Abdominal pain: lower abdomen.    Denies acid reflux, belching, chest pain, dysphagia with liquids, dysphagia with solids, heartburn, loss of appetite, nausea, vomiting, vomiting blood, weight loss, and  weight gain.      Reports constipation, rectal bleeding, and  rectal pain.     Denies anal fissure, black tarry stools, change in bowel habit, diarrhea, diverticulosis, fecal incontinence, heme positive stool, hemorrhoids, irritable bowel syndrome, jaundice, light color stool, and  liver problems. Preventive Screening-Counseling & Management      Drug Use:  no.      Current Medications (verified): 1)  Actos 15 Mg  Tabs (Pioglitazone Hcl) .... One  By Mouth Two Times A Day 2)  Atenolol 50 Mg Tabs (Atenolol) .... Take One Tablet By Mouth Two Times A Day 3)  Restasis 0.05 % Emul (Cyclosporine) .... Use As Directed 4)  Amitiza 24 Mcg Caps (Lubiprostone) .Marland Kitchen.. 1 Capsule Two Times A Day As Needed 5)  Amaryl 2 Mg Tabs (Glimepiride) .Marland Kitchen.. 1 By Mouth Two Times A Day 6)  Patanol 0.1 % Soln (Olopatadine Hcl) .Marland Kitchen..  1 Drop in Each Eye Daily 7)  Lotemax 0.5 % Susp (Loteprednol Etabonate) .Marland Kitchen.. 1 Drop in Each Eye Daily 8)  Flexeril 10 Mg Tabs (Cyclobenzaprine Hcl) .Marland Kitchen.. 1 By Mouth Daily As Needed 9)  Ventolin Hfa 108 (90 Base) Mcg/act Aers (Albuterol Sulfate) .... Inhale 1-2 Puffs Every 4 Hours As Needed Needs Appt With Dr. Delford Field!!! 10)  Prilosec 20 Mg Cpdr (Omeprazole) .... Two Times A Day 11)  Cetirizine Hcl 10 Mg Tabs (Cetirizine Hcl) .... Take One By Mouth As Needed 12)  Lyrica 50 Mg Caps (Pregabalin) .... Take One By Mouth Once Daily 13)  Amitiza 24 Mcg Caps (Lubiprostone) .... Take One By Mouth Once Daily  Allergies (verified): 1)  ! Codeine 2)  ! Demerol 3)  ! * Ivp Dye  Past History:  Past Medical History: Reviewed history from 10/05/2008 and no changes required. 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  disabled Alcohol Use - yes rare Daily Caffeine Use i can soda a day Illicit Drug Use - no Drug Use:  no  Review of Systems  The patient denies allergy/sinus,  anemia, anxiety-new, arthritis/joint pain, back pain, blood in urine, breast changes/lumps, change in vision, confusion, cough, coughing up blood, depression-new, fainting, fatigue, fever, headaches-new, hearing problems, heart murmur, heart rhythm changes, itching, menstrual pain, muscle pains/cramps, night sweats, nosebleeds, pregnancy symptoms, shortness of breath, skin rash, sleeping problems, sore throat, swelling of feet/legs, swollen lymph glands, thirst - excessive , urination - excessive , urination changes/pain, urine leakage, vision changes, and voice change.    Vital Signs:  Patient profile:   40 year old female Height:      67 inches Weight:      199.2 pounds BMI:     31.31 Pulse rate:   78 / minute Pulse rhythm:   regular BP sitting:   124 / 72  (left arm) Cuff size:   regular  Vitals Entered By: Harlow Mares CMA Duncan Dull) (February 25, 2010 9:14 AM)  Physical Exam  Additional Exam:  Physical Exam She Is a Well-Developed Nourished Female  On Rectal Exam There Are No External Masses or Hemorrhoids   Impression & Recommendations:  Problem # 1:  INTERNAL HEMORRHOIDS WITHOUT MENTION COMP (ICD-455.0) Assessment Deteriorated  Patient is symptomatic with bleeding and pain despite medical therapy.  Recommendations #1 sigmoidoscopy with band ligation of her hemorrhoids  Orders: ZFLEX with Banding (ZFL with Band)  Problem # 2:  CONSTIPATION (ICD-564.00) Plan fiber supplementation  Patient Instructions: 1)  Copy sent to : Judi Cong MD 2)  Your Flexible Sigmoidoscopy is scheduled for 03/18/2010 at 9am 3)  We have sent a rx to your pharmacy today 4)  The medication list was reviewed and reconciled.  All changed / newly prescribed medications were explained.  A complete medication list was provided to the patient / caregiver. Prescriptions: ANUSOL-HC 25 MG  SUPP (HYDROCORTISONE ACETATE) takeone suppository q.h.s.  #7 x 1   Entered and Authorized by:   Louis Meckel MD    Signed by:   Louis Meckel MD on 02/25/2010   Method used:   Electronically to        [facility] [address] [phone]* (retail)       [address]       [place], [place]  [address]       Ph: [phone]       Fax: [phone]   RxID:   [id number]

## 2010-06-17 NOTE — Progress Notes (Signed)
Summary: Nuclear Pre-Procedure  Phone Note Outgoing Call   Call placed by: Milana Na, EMT-P,  August 26, 2009 3:14 PM Summary of Call: Reviewed information on Myoview Information Sheet (see scanned document for further details).  The patient's phone has been disconnected.     Nuclear Med Background Indications for Stress Test: Evaluation for Ischemia   History: Asthma, COPD, Defibrillator, Echo, Myocardial Perfusion Study  History Comments: '03 ECHO NL LVF 07/04 MPS EF 72% (-) ischemia '08 Defibrilator prolonged QT Sarcoidosis  Symptoms: Chest Pain, Fatigue, Palpitations    Nuclear Pre-Procedure Cardiac Risk Factors: Family History - CAD, Hypertension, NIDDM Height (in): 67  Nuclear Med Study Referring MD:  G.Ladona Ridgel

## 2010-06-17 NOTE — Progress Notes (Signed)
Summary: Needs OV with PW  Phone Note Outgoing Call   Call placed by: Gweneth Dimitri RN,  December 02, 2009 3:44 PM Summary of Call: Received rx request for ventolin however, pt was last seen by Dr. Delford Field 11/21/2008 and has no pending appts.  ATC pt's home number but that number is no longer a working number.  Found pt's daughter's number under Registration Notes.  Called that number=LMOM for pt to call office back to schedule her OV with PW and then will send rxs to last until appt.   Initial call taken by: Gweneth Dimitri RN,  December 02, 2009 3:46 PM  Follow-up for Phone Call        Pt's home number still not a working number.  Called her daughter's number-LMIMTCB  Gweneth Dimitri RN  December 03, 2009 5:09 PM   Received another rx request for ventolin.  ATC pt's home number but it is still not working.  Called pt's daughter's number again.  LMOMTCB.  I will send 1 refill to the pharm with the note that pt must make appt for any additional rxs.  Gweneth Dimitri RN  December 04, 2009 4:20 PM

## 2010-06-17 NOTE — Assessment & Plan Note (Signed)
Summary: eph./ gd   Visit Type:  eph Primary Provider:  Judi Cong MD  CC:  tired, CP, SOB, and .  History of Present Illness: [name] returns today for followup of her palpitations and c/p.   We decided on a period of watchful waiting but she continued to be symptomatic despite beta blockers and had documented SVT.  She underwent EPS /RFA of AVNRT.   Since her ablation she has had rare palpitations lasting a second or two.  No sustained heart racing.  Current Medications (verified): 1)  Actos 15 Mg  Tabs (Pioglitazone Hcl) .... One  By Mouth Once Daily 2)  Atenolol 50 Mg Tabs (Atenolol) .... Take One Tablet By Mouth Two Times A Day 3)  Restasis 0.05 % Emul (Cyclosporine) .... Use As Directed 4)  Amitiza 24 Mcg Caps (Lubiprostone) .Marland Kitchen.. 1 Capsule Two Times A Day As Needed 5)  Amaryl 2 Mg Tabs (Glimepiride) .Marland Kitchen.. 1 By Mouth Daily 6)  Patanol 0.1 % Soln (Olopatadine Hcl) .Marland Kitchen.. 1 Drop in Each Eye Daily 7)  Lotemax 0.5 % Susp (Loteprednol Etabonate) .Marland Kitchen.. 1 Drop in Each Eye Daily 8)  Flexeril 10 Mg Tabs (Cyclobenzaprine Hcl) .Marland Kitchen.. 1 By Mouth Daily As Needed 9)  Ventolin Hfa 108 (90 Base) Mcg/act Aers (Albuterol Sulfate) .... Inhale 1-2 Puffs Every 4 Hours As Needed Needs Appt With Dr. Delford Field!!! 10)  Prilosec 20 Mg Cpdr (Omeprazole) .... Two Times A Day (Dose Unkown)  Allergies (verified): 1)  ! Codeine 2)  ! Demerol 3)  ! 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         She c/o back and hip pain.  Vital Signs:  Patient profile:   40 year old female Weight:      199.50 pounds Pulse rate:   77 / minute BP sitting:   116 / 82  (left arm) Cuff size:   large  Vitals Entered By: Caralee Ates CMA (February 24, 2010 12:16 PM)  Physical Exam  General:  Well developed, well nourished, in no acute distress.  HEENT: normal Neck: supple. No JVD. Carotids 2+ bilaterally no bruits Cor: RRR no rubs, gallops or murmur Lungs: CTA.  A well healed ICD incision is present. Ab: soft, nontender. nondistended. No HSM. Good bowel sounds Ext: warm. no cyanosis, clubbing or edema Neuro: alert and oriented. Grossly nonfocal. affect pleasant    EKG  Procedure date:  02/24/2010  Findings:      Normal sinus rhythm with rate of: 77.  Non-specific ST-T wave changes noted.  Left axis deviation.     ICD Specifications Following MD:  Lewayne Bunting, MD     ICD Vendor:  Medtronic     ICD Model Number:  7232     ICD Serial Number:  [id number] ICD DOI:  01/13/2007     ICD Implanting MD:  Lewayne Bunting, MD  Lead 1:    Location: RV     DOI: 01/13/2007  Model #: [id number]     Serial #: [id number]     Status: active  Indications::  LONG QT   ICD Follow Up Remote Check?  No Battery Voltage:  3.11 V     Charge Time:  7.8 seconds     Underlying rhythm:  SR ICD Dependent:  No       ICD Device Measurements Right Ventricle:  Amplitude: 7.4 mV, Impedance: 472 ohms, Threshold: 1.0 V at 0.4 msec Shock Impedance: 44/56 ohms   Episodes Coumadin:  No Shock:  0     ATP:  0     Nonsustained:  0     Ventricular Pacing:  <0.1%  Brady Parameters Mode VVI     Lower Rate Limit:  40      Tachy Zones VF:  200     VT:  240      VT1:  162 (MONITOR)     Next Cardiology Appt Due:  05/18/2010 Tech Comments:  No parameter changes.  Device function normal.  ROV 3 months clinic. Altha Harm, LPN  February 24, 2010 12:20 PM   Impression & Recommendations:  Problem # 1:  AUTOMATIC IMPLANTABLE CARDIAC DEFIBRILLATOR SITU (ICD-V45.02) Her device is working normally.  Will recheck in several months.  Problem # 2:  PSVT (ICD-427.0) Since her ablation, she has had no recurrent SVT.  She will continue her meds as below (HTN). Her updated medication list for this problem includes:    Atenolol 50 Mg Tabs (Atenolol) .Marland Kitchen... Take one tablet by mouth two times a day  Problem # 3:  LONG QT SYNDROME (ICD-759.89) She has had no ventricular arrhythmias.  Her QT is prolonged.  Patient Instructions: 1)  Your physician recommends that you schedule a follow-up appointment in: 3 months with Dr Ladona Ridgel and 12months with DrTaylor

## 2010-06-17 NOTE — Progress Notes (Signed)
Summary: chest pain  Phone Note Call from Patient Call back at Home Phone [phone]   Caller: Patient Summary of Call: chest pain Initial call taken by: Judie Grieve,  August 21, 2009 4:21 PM  Follow-up for Phone Call        Patient called complaining of episodes of "racing heart" accompanied with chest pain lasting about 30 minutes...several episodes the last few days. She states that her AICD has not shocked her. Appointment made for tomorrow with Dr.Taylor for 1115 am. She was instructed to go to the ER if she has more frequent episodes.  Follow-up by: Suzan Garibaldi RN

## 2010-06-17 NOTE — Progress Notes (Signed)
Summary: triage  Phone Note Call from Patient Call back at Home Phone [phone]   Caller: Patient Call For: Dr. Arlyce Dice Reason for Call: Talk to Nurse Summary of Call: would like to be worked in for rectal bleeding... passing blood when pt passes gas Initial call taken by: Vallarie Mare,  February 24, 2010 3:59 PM  Follow-up for Phone Call        Patient  c/o bright red rectal bleeding.  Passing gas and small amounts of blood.  Denies chest pain, SOB, dizziness.  Patient  states she is "always tired".  Patient  will come in the am to see Dr Arlyce Dice at 9:00 Follow-up by: Darcey Nora RN, CGRN,  February 24, 2010 4:23 PM

## 2010-06-17 NOTE — Progress Notes (Signed)
Summary: Symbicort PA- med approved until 03/25/12  Phone Note Outgoing Call   Call placed by: Vernie Murders,  March 26, 2010 4:34 PM Call placed to: Insurer Summary of Call: Recieved PA request for Symbicort from the [employer] rd.  Called to initiate PA.  Answered the clinical questions and was able to get med approved from today until 03/25/12.  Faxed note back to pharmacy. Initial call taken by: Vernie Murders,  March 26, 2010 4:37 PM

## 2010-06-17 NOTE — Progress Notes (Signed)
Summary: test results/ mail / pt aware of results  PFH,RN  Phone Note Call from Patient   Caller: Patient Reason for Call: Talk to Nurse, Lab or Test Results Summary of Call: can test result be mail to patient due to phone is off. will try to call back later this afternoon. Initial call taken by: Lorne Skeens,  September 04, 2009 12:39 PM  Follow-up for Phone Call        When the pt calls back please notify the triage nurse to give the pt her results.  Julieta Gutting, RN, BSN  September 04, 2009 5:24 PM  pt aware of results of myoview and labs.  She is following up with her primary MD about her BS (245) Follow-up by: Charolotte Capuchin, RN,  September 05, 2009 9:28 AM

## 2010-06-17 NOTE — Cardiovascular Report (Signed)
Summary: Office Visit  Office Visit   Imported By: Marylou Mccoy 01/10/2010 10:28:35  _____________________________________________________________________  External Attachment:    Type:   Image     Comment:   External Document

## 2010-06-17 NOTE — Progress Notes (Signed)
Summary: Nasonex changed to Fluticasone  Phone Note Outgoing Call   Call placed by: Reynaldo Minium CMA,  March 12, 2010 4:39 PM Call placed to: PA dept-medicaid Summary of Call: Recieved PA([facility] [phone]) for patients Nasonex-called [phone] will not cover RX until the patient has tried and failed Fluticasone. I have sent message to PW and given the paper PA information to Crystal. Please advise.  Initial call taken by: Reynaldo Minium CMA,  March 12, 2010 4:40 PM  Follow-up for Phone Call        ok to change to fluticasone two sprays each nostril daily Follow-up by: Storm Frisk MD,  March 12, 2010 5:04 PM  Additional Follow-up for Phone Call Additional follow up Details #1::        Nasonex changed to Fluticasone.  [employer], spoke with [place].  He was informed nasonex changed to fluticasone 2 sprays each nostril once daily #1 x 1.  He verbalized understanding of this.    ATC pt's home number to inform her of change however no answer and unable to leave message.  WCB Crystal Jones RN  March 13, 2010 11:29 AM  LM w/ FM TCB. Boone Master CNA/MA  March 13, 2010 3:56 PM      Additional Follow-up for Phone Call Additional follow up Details #2::    called spoke w/ FM who stated that pt had tried to call our office but her phone has been acting up.  was unable to provide another #.  LM again TCB. Boone Master CNA/MA  March 14, 2010 4:30 PM   pt advised nasonex changed to fluticasone and sent to pharmacy.Carron Curie CMA  March 17, 2010 4:26 PM   New/Updated Medications: FLUTICASONE PROPIONATE 50 MCG/ACT SUSP (FLUTICASONE PROPIONATE) two sprays each nostril once daily Prescriptions: FLUTICASONE PROPIONATE 50 MCG/ACT SUSP (FLUTICASONE PROPIONATE) two sprays each nostril once daily  #1 x 1   Entered by:   Gweneth Dimitri RN   Authorized by:   Storm Frisk MD   Signed by:   Boone Master CNA/MA on 03/13/2010   Method used:   Telephoned  to ...       [facility]  [address] [phone]* (retail)       [address]       [place], [place]  [address]       Ph: [phone]       Fax: [phone]   RxID:   [id number]

## 2010-06-19 NOTE — Progress Notes (Signed)
Summary: nos appt  Phone Note Call from Patient   Caller: [email or web address]  Call For: wright Summary of Call: ATC pt to rsc nos from 1/6, phone disconnected. Initial call taken by: Darletta Moll,  May 26, 2010 10:12 AM

## 2010-06-19 NOTE — Procedures (Signed)
Summary: device check   Current Medications (verified): 1)  Actos 15 Mg  Tabs (Pioglitazone Hcl) .... One  By Mouth Two Times A Day 2)  Atenolol 50 Mg Tabs (Atenolol) .... Take One Tablet By Mouth Two Times A Day 3)  Restasis 0.05 % Emul (Cyclosporine) .... Use As Directed 4)  Amitiza 24 Mcg Caps (Lubiprostone) .Marland Kitchen.. 1 Capsule Two Times A Day As Needed 5)  Amaryl 2 Mg Tabs (Glimepiride) .Marland Kitchen.. 1 By Mouth Two Times A Day 6)  Patanol 0.1 % Soln (Olopatadine Hcl) .Marland Kitchen.. 1 Drop in Each Eye Daily 7)  Lotemax 0.5 % Susp (Loteprednol Etabonate) .Marland Kitchen.. 1 Drop in Each Eye Daily 8)  Flexeril 10 Mg Tabs (Cyclobenzaprine Hcl) .Marland Kitchen.. 1 By Mouth Daily As Needed 9)  Ventolin Hfa 108 (90 Base) Mcg/act Aers (Albuterol Sulfate) .... Inhale 1-2 Puffs Every 4 Hours As Needed 10)  Prilosec 20 Mg Cpdr (Omeprazole) .... Two Times A Day Hold Until Dexilant Gone Then Resume 11)  Cetirizine Hcl 10 Mg Tabs (Cetirizine Hcl) .... Take One By Mouth As Needed 12)  Lyrica 50 Mg Caps (Pregabalin) .... Take One By Mouth Once Daily 13)  Dexilant 60 Mg Cpdr (Dexlansoprazole) .... Take One By Mouth Daily Until Samples Gone Then Resume Prilosec 14)  Tramadol Hcl 50 Mg  Tabs (Tramadol Hcl) .... One To Two By Mouth Every 4-6 Hours As Needed Cough 15)  Fluticasone Propionate 50 Mcg/act Susp (Fluticasone Propionate) .... Two Sprays Each Nostril Once Daily 16)  Symbicort 160-4.5 Mcg/act Aero (Budesonide-Formoterol Fumarate) .... Inhale 2 Puffs Two Times A Day  Allergies (verified): 1)  ! Codeine 2)  ! Demerol 3)  ! * Ivp Dye   ICD Specifications Following MD:  Lewayne Bunting, MD     ICD Vendor:  Medtronic     ICD Model Number:  7232     ICD Serial Number:  [id number] ICD DOI:  01/13/2007     ICD Implanting MD:  Lewayne Bunting, MD  Lead 1:    Location: RV     DOI: 01/13/2007     Model #: [id number]     Serial #: [id number]     Status: active  Indications::  LONG QT   ICD Follow Up Battery Voltage:  3.11 V     Charge Time:  7.80 seconds      Underlying rhythm:  SR ICD Dependent:  No       ICD Device Measurements Right Ventricle:  Amplitude: 9.9 mV, Impedance: 488 ohms, Threshold: 1.0 V at 0.8 msec Shock Impedance: 52/70 ohms   Episodes MS Episodes:  0     Coumadin:  No Shock:  0     ATP:  0     Nonsustained:  0     Atrial Therapies:  0 Ventricular Pacing:  <0.1%  Brady Parameters Mode VVI     Lower Rate Limit:  40      Tachy Zones VF:  200     VT:  240     VT1:  162 (MONITOR)     Next Cardiology Appt Due:  08/18/2010 Tech Comments:  NORMAL DEVICE FUNCTION.  NO EPISODES SINCE LAST CHECK.  INSTALLED LIA.  DEMONSTRATED TONES FOR PT.  ROV IN 3 MTHS W/DEVICE CLINIC. Vella Kohler  May 27, 2010 10:42 AM

## 2010-07-08 ENCOUNTER — Telehealth: Payer: Self-pay | Admitting: Internal Medicine

## 2010-07-29 LAB — GLUCOSE, CAPILLARY: Glucose-Capillary: 230 mg/dL — ABNORMAL HIGH (ref 70–99)

## 2010-07-29 NOTE — Progress Notes (Signed)
Summary: rx refill  Phone Note Refill Request Message from:  Patient on July 08, 2010 2:48 PM  Refills Requested: Medication #1:  ATENOLOL 50 MG TABS Take one tablet by mouth two times a day  Method Requested: Telephone to Pharmacy Initial call taken by: Roe Coombs,  July 08, 2010 2:48 PM  Follow-up for Phone Call        Phone number disconnected. Per d/c summary pt is supposed to be on atenolol 50mg  1 by mouth daily. Will notifiy Jewel so she can contact Dr. Ladona Ridgel. Marrion Coy, CNA  July 08, 2010 4:03 PM  Follow-up by: Marrion Coy, CNA,  July 08, 2010 4:03 PM  Additional Follow-up for Phone Call Additional follow up Details #1::        I was able to obtain a secondary phone number for pt. I left a message asking for a return call. [phone] Additional Follow-up by: Laurance Flatten CMA,  July 11, 2010 2:56 PM

## 2010-08-01 LAB — GLUCOSE, CAPILLARY: Glucose-Capillary: 206 mg/dL — ABNORMAL HIGH (ref 70–99)

## 2010-08-06 ENCOUNTER — Encounter (INDEPENDENT_AMBULATORY_CARE_PROVIDER_SITE_OTHER): Payer: Self-pay | Admitting: *Deleted

## 2010-08-14 NOTE — Letter (Signed)
Summary: Appointment - Reschedule  [facility], Main Office  [address]   [place], [place] [address]   Phone: [phone]  Fax: [phone]     August 06, 2010 MRN: [id number]   [name] [address] [place], [place]  [address]   Dear Ms. Amoroso,   Due to a change in our office schedule, your appointment on  08-27-10  at  10:00am            must be changed.  It is very important that we reach you to reschedule this appointment. We look forward to participating in your health care needs. Please contact us at the number listed above at your earliest convenience to reschedule this appointment.     Sincerely,  Glass blower/designer

## 2010-08-18 LAB — GLUCOSE, CAPILLARY
Glucose-Capillary: 150 mg/dL — ABNORMAL HIGH (ref 70–99)
Glucose-Capillary: 172 mg/dL — ABNORMAL HIGH (ref 70–99)

## 2010-08-21 LAB — URINALYSIS, ROUTINE W REFLEX MICROSCOPIC
Hgb urine dipstick: NEGATIVE
Ketones, ur: NEGATIVE mg/dL
Protein, ur: NEGATIVE mg/dL
Urobilinogen, UA: 1 mg/dL (ref 0.0–1.0)

## 2010-08-21 LAB — CK TOTAL AND CKMB (NOT AT ARMC)
CK, MB: 1.1 ng/mL (ref 0.3–4.0)
Relative Index: 0.9 (ref 0.0–2.5)

## 2010-08-21 LAB — URINE MICROSCOPIC-ADD ON

## 2010-08-21 LAB — CARDIAC PANEL(CRET KIN+CKTOT+MB+TROPI)
CK, MB: 1.2 ng/mL (ref 0.3–4.0)
Relative Index: 0.7 (ref 0.0–2.5)
Relative Index: 1 (ref 0.0–2.5)

## 2010-08-21 LAB — GLUCOSE, CAPILLARY
Glucose-Capillary: 224 mg/dL — ABNORMAL HIGH (ref 70–99)
Glucose-Capillary: 235 mg/dL — ABNORMAL HIGH (ref 70–99)

## 2010-08-21 LAB — CBC
HCT: 40.5 % (ref 36.0–46.0)
Platelets: 278 10*3/uL (ref 150–400)
RDW: 12.6 % (ref 11.5–15.5)

## 2010-08-21 LAB — COMPREHENSIVE METABOLIC PANEL
Albumin: 3.9 g/dL (ref 3.5–5.2)
BUN: 3 mg/dL — ABNORMAL LOW (ref 6–23)
Calcium: 8.9 mg/dL (ref 8.4–10.5)
Creatinine, Ser: 0.58 mg/dL (ref 0.4–1.2)
Total Protein: 7.3 g/dL (ref 6.0–8.3)

## 2010-08-21 LAB — POCT CARDIAC MARKERS
CKMB, poc: 1.1 ng/mL (ref 1.0–8.0)
CKMB, poc: <1 ng/mL — ABNORMAL LOW (ref 1.0–8.0)
Troponin i, poc: <0.05 ng/mL (ref 0.00–0.09)

## 2010-08-21 LAB — DIFFERENTIAL
Basophils Absolute: 0.1 10*3/uL (ref 0.0–0.1)
Eosinophils Relative: 1 % (ref 0–5)
Lymphocytes Relative: 46 % (ref 12–46)

## 2010-08-21 LAB — BASIC METABOLIC PANEL
BUN: 3 mg/dL — ABNORMAL LOW (ref 6–23)
Calcium: 8.7 mg/dL (ref 8.4–10.5)
Creatinine, Ser: 0.68 mg/dL (ref 0.4–1.2)
GFR calc non Af Amer: >60 mL/min (ref >60)
Glucose, Bld: 271 mg/dL — ABNORMAL HIGH (ref 70–99)
Potassium: 3.7 mEq/L (ref 3.5–5.1)

## 2010-08-21 LAB — APTT: aPTT: 28 seconds (ref 24–37)

## 2010-08-21 LAB — D-DIMER, QUANTITATIVE: D-Dimer, Quant: <0.22 ug/mL-FEU (ref 0.00–0.48)

## 2010-08-21 LAB — HCG, SERUM, QUALITATIVE: Preg, Serum: NEGATIVE

## 2010-08-21 LAB — PROTIME-INR: Prothrombin Time: 13.5 seconds (ref 11.6–15.2)

## 2010-08-21 LAB — TROPONIN I: Troponin I: 0.01 ng/mL (ref 0.00–0.06)

## 2010-08-24 LAB — POCT PREGNANCY, URINE: Preg Test, Ur: NEGATIVE

## 2010-08-24 LAB — RAPID URINE DRUG SCREEN, HOSP PERFORMED
Benzodiazepines: NOT DETECTED
Tetrahydrocannabinol: NOT DETECTED

## 2010-08-24 LAB — GLUCOSE, CAPILLARY

## 2010-08-26 LAB — CBC
MCV: 87.2 fL (ref 78.0–100.0)
Platelets: 223 10*3/uL (ref 150–400)
RBC: 4.72 MIL/uL (ref 3.87–5.11)
WBC: 6.8 10*3/uL (ref 4.0–10.5)

## 2010-08-26 LAB — COMPREHENSIVE METABOLIC PANEL
ALT: 14 U/L (ref 0–35)
AST: 18 U/L (ref 0–37)
Albumin: 3.8 g/dL (ref 3.5–5.2)
Alkaline Phosphatase: 68 U/L (ref 39–117)
CO2: 25 mEq/L (ref 19–32)
Chloride: 101 mEq/L (ref 96–112)
Creatinine, Ser: 0.56 mg/dL (ref 0.4–1.2)
GFR calc Af Amer: >60 mL/min (ref >60)
GFR calc non Af Amer: >60 mL/min (ref >60)
Potassium: 3.5 mEq/L (ref 3.5–5.1)
Sodium: 134 mEq/L — ABNORMAL LOW (ref 135–145)
Total Bilirubin: 0.7 mg/dL (ref 0.3–1.2)

## 2010-08-26 LAB — URINE MICROSCOPIC-ADD ON

## 2010-08-26 LAB — URINALYSIS, ROUTINE W REFLEX MICROSCOPIC
Bilirubin Urine: NEGATIVE
Hgb urine dipstick: NEGATIVE
Ketones, ur: 15 mg/dL — AB
Nitrite: NEGATIVE
pH: 5.5 (ref 5.0–8.0)

## 2010-08-26 LAB — DIFFERENTIAL
Basophils Absolute: 0.1 10*3/uL (ref 0.0–0.1)
Eosinophils Absolute: 0.1 10*3/uL (ref 0.0–0.7)
Eosinophils Relative: 1 % (ref 0–5)
Lymphocytes Relative: 44 % (ref 12–46)
Monocytes Absolute: 0.5 10*3/uL (ref 0.1–1.0)

## 2010-08-26 LAB — GLUCOSE, CAPILLARY: Glucose-Capillary: 278 mg/dL — ABNORMAL HIGH (ref 70–99)

## 2010-08-26 LAB — PROTIME-INR: Prothrombin Time: 13.5 seconds (ref 11.6–15.2)

## 2010-08-27 ENCOUNTER — Encounter: Payer: Self-pay | Admitting: *Deleted

## 2010-09-05 ENCOUNTER — Encounter: Payer: Self-pay | Admitting: Internal Medicine

## 2010-09-08 ENCOUNTER — Ambulatory Visit (INDEPENDENT_AMBULATORY_CARE_PROVIDER_SITE_OTHER): Payer: Medicaid Other | Admitting: Internal Medicine

## 2010-09-08 ENCOUNTER — Encounter: Payer: Self-pay | Admitting: Internal Medicine

## 2010-09-08 DIAGNOSIS — R072 Precordial pain: Secondary | ICD-10-CM

## 2010-09-08 DIAGNOSIS — I471 Supraventricular tachycardia: Secondary | ICD-10-CM

## 2010-09-08 DIAGNOSIS — Z95810 Presence of automatic (implantable) cardiac defibrillator: Secondary | ICD-10-CM

## 2010-09-08 DIAGNOSIS — I4581 Long QT syndrome: Secondary | ICD-10-CM

## 2010-09-08 NOTE — Assessment & Plan Note (Signed)
I suspect her symptoms are due to musculoskeletal chest pain. Her chest is tender to palpation on exam. I recommended that she try Tylenol as needed.

## 2010-09-08 NOTE — Patient Instructions (Signed)
Your physician wants you to follow-up in: 3 months with device clinic. You will receive a reminder letter in the mail two months in advance. If you don't receive a letter, please call our office to schedule the follow-up appointment.

## 2010-09-08 NOTE — Assessment & Plan Note (Signed)
She has had no episodes of VT. Will follow.

## 2010-09-08 NOTE — Progress Notes (Signed)
HPI [name] returns today for followup. She has a family history of long QT syndrome and sudden cardiac death. 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 She has had no recurrent tachycardia palpitations. Patient today complains of chest discomfort. It is positional made worse when she vacuums and her chest is tender to palpation. It is alleviated by rest and exacerbated by movement of the upper extremities. She denies any intercurrent device therapies. Allergies  Allergen Reactions  . Codeine   . Iohexol      Code: RASH, Desc: PT STATES SHE IS ALLERGIC TO IV CONTRAST 05/28/06/RM, Onset Date: [id number]   . Meperidine Hcl      Current Outpatient Prescriptions  Medication Sig Dispense Refill  . atenolol (TENORMIN) 50 MG tablet Take 50 mg by mouth 2 (two) times daily.        . budesonide-formoterol (SYMBICORT) 160-4.5 MCG/ACT inhaler Inhale 2 puffs into the lungs 2 (two) times daily.        . cetirizine (ZYRTEC) 10 MG tablet Take 10 mg by mouth daily.        . cyclobenzaprine (FLEXERIL) 10 MG tablet Take 10 mg by mouth as needed.       . cycloSPORINE (RESTASIS) 0.05 % ophthalmic emulsion 1 drop 2 (two) times daily.        Marland Kitchen gabapentin (NEURONTIN) 100 MG capsule Take 100 mg by mouth as directed.        Marland Kitchen glimepiride (AMARYL) 2 MG tablet Take 2 mg by mouth daily before breakfast.        . insulin glargine (LANTUS) 100 UNIT/ML injection Inject into the skin as directed.        Marland Kitchen olopatadine (PATANOL) 0.1 % ophthalmic solution 1 drop 2 (two) times daily.        Marland Kitchen omeprazole (PRILOSEC) 20 MG capsule Take 20 mg by mouth daily.        . pioglitazone (ACTOS) 15 MG tablet Take 15 mg by mouth daily.        . TraMADol HCl 50 MG TBSO Take by mouth as needed.        . lubiprostone (AMITIZA) 24 MCG capsule Take 24 mcg by mouth 2 (two) times daily with a meal.        . DISCONTD: pregabalin (LYRICA) 50 MG capsule Take 50 mg by  mouth daily.           Past Medical History  Diagnosis Date  . Long Q-T syndrome   . Palpitation   . Seizure disorder   . Abdominal pain, periumbilic   . Esophageal stricture   . Sarcoidosis   . HTN (hypertension)   . DM (diabetes mellitus)   . Allergic rhinitis   . Bronchitis   . Sarcoidosis   . GERD (gastroesophageal reflux disease)   . Asthma   . Fibromyalgia     ROS:   All systems reviewed and negative except as noted in the HPI.   Past Surgical History  Procedure Date  . Tubal ligation   . Hand surgery   . Svt ablation   . Automatic implantable cardiac defibrillator situ     ICD-Medtronic   Remote - Yes      Family History  Problem Relation Age of Onset  . Cancer    . Diabetes       History   Social History  . Marital Status: Single    Spouse Name: N/A  Number of Children: N/A  . Years of Education: N/A   Occupational History  . Not on file.   Social History Main Topics  . Smoking status: Never Smoker   . Smokeless tobacco: Not on file  . Alcohol Use: Yes  . Drug Use: No  . Sexually Active: Not on file   Other Topics Concern  . Not on file   Social History Narrative  . No narrative on file     BP 122/80  Pulse 86  Ht 5\' 7"  (1.702 m)  Wt 201 lb (91.173 kg)  BMI 31.48 kg/m2  Physical Exam:  Well appearing NAD HEENT: Unremarkable Neck:  No JVD, no thyromegally Lymphatics:  No adenopathy Back:  No CVA tenderness Lungs:  Clear. No wheezes rales or rhonchi. Well-healed defibrillator incision. HEART:  Regular rate rhythm, no murmurs, no rubs, no clicks Abd:  Flat, positive bowel sounds, no organomegally, no rebound, no guarding Ext:  2 plus pulses, no edema, no cyanosis, no clubbing Skin:  No rashes no nodules Neuro:  CN II through XII intact, motor grossly intact DEVICE  Normal device function.  See PaceArt for details.   Assess/Plan:

## 2010-09-08 NOTE — Assessment & Plan Note (Signed)
She is status post ablation and has had no recurrent arrhythmias.

## 2010-09-08 NOTE — Assessment & Plan Note (Signed)
Her device is working normally. There is been no ICD shocks.

## 2010-09-30 NOTE — Assessment & Plan Note (Signed)
[facility]                         ELECTROPHYSIOLOGY OFFICE NOTE   NAME:[name], [name]                       MRN:          [id number]  DATE:03/29/2007                            DOB:          06/10/1970    Ms. Engh returns today for followup.  She is a very pleasant 40-year-  old woman with the history of long QT syndrome and syncope and a family  history of sudden cardiac death, who is status post ICD insertion.  She  initially had a 6949 lead and ultimately insisted on its being removed  with stent sew-over at [facility].  She underwent device  reimplantation approximately three months ago and returns today for  followup.   She notes rare palpitations, but has otherwise been stable.  She denies  chest pain or shortness of breath.  She denies syncope.   PHYSICAL EXAM:  She is a pleasant, well-appearing young woman, in no  acute distress.  Blood pressure today was 120/70, the pulse was 85 and regular,  respirations were 18 and the weight was 199 pounds.  NECK:  Revealed no jugular venous distention.  LUNGS:  Clear bilaterally to auscultation, no wheezes, rales or rhonchi  are present.  CARDIOVASCULAR EXAM:  Revealed a regular rate and rhythm with normal S1  and S2.  EXTREMITIES:  Demonstrated no edema.  The defibrillator insertion site demonstrated no hematoma and was healed  nicely.   MEDICATIONS INCLUDE:  1. Actos 15 a day.  2. Atenolol 25 a day.  3. Vitamin B12 shots.  4. Medroxyprogesterone p.r.n.   Her EKG demonstrates sinus rhythm with right axis and nonspecific T-wave  abnormality.   Interrogation of her defibrillator demonstrates a Medtronic Maximo,  model 7232, with R-waves of 8, the impedance of 472, the threshold was  2.5 at 0.3, battery voltage was 3.2 volts.  There were no intercurrent  ICD therapies and there were no nonsustained episodes of VT.   Today, we reprogrammed her device to a monitor-only zone at 150.   The  patient does have palpitations and hopefully we will be able to discover  what the mechanism of her palpitations is, based on the results in the  monitor-only zone.   IMPRESSION:  1. Long QT syndrome.  2. Status post ICD insertion.   DISCUSSION:  Overall, Ms. Olberding is stable.  Her defibrillator is  working normally.  We will see her back in the office in nine months.     Doylene Canning. Ladona Ridgel, MD  Electronically Signed    GWT/MedQ  DD: 03/29/2007  DT: 03/30/2007  Job #: [id number]

## 2010-09-30 NOTE — Discharge Summary (Signed)
[name], [name]              ACCOUNT NO.:  [id number]   MEDICAL RECORD NO.:  [id number]          PATIENT TYPE:  INP   LOCATION:  4708                         FACILITY:  [facility]   PHYSICIAN:  Maple Mirza, PA   DATE OF BIRTH:  [birth year]   DATE OF ADMISSION:  01/13/2007  DATE OF DISCHARGE:  01/14/2007                               DISCHARGE SUMMARY   Greater than 35 minutes for this discharge.   FINAL DIAGNOSES:  1. Long QT syndrome.  2. Syncope.  3. Explantation of Medtronic [phone] right ventricular lead, in the      immediate past.  4. Status post implantation of Medtronic MAXIMO VR single-chamber      cardioverter defibrillator, Dr. Lewayne Bunting, with implant of a      Sprint Quattro Secure 6947-65-cm right ventricular lead.      Defibrillator threshold study less than or equal to 6 joules.   BRIEF HISTORY:  [name] is a 40 year old female.  She has a history  of long QT syndrome and syncope.  She is status post previous  cardioverter-defibrillator insertion.  The patient did have a right  ventricular 6949 lead, which was on recall in some cases.  She insisted  on having it removed.  This was done at [facility].   She wore the life vest after the procedure for 3 weeks, but then sent it  back.  She has had no subsequent syncope, and she has had no chest pain.  She does have some tenderness over the ICD incision, site but no  specific complaints.   It is time for the patient to have her device re-implanted.  She wishes  to have it done here at [facility].  This will be done on and  elective basis.   HOSPITAL COURSE:  The patient presented electively January 13, 2007, she  underwent implant of a Medtronic MAXIMO VR single-chamber defibrillator,  with defibrillator threshold study.  She has had no post-procedural  complications, except for rather moderate-to-severe tenderness, at the  incision site and she will discharge with pain medication, and she  will  follow up with her regular medications.  She was asked to keep her  incision dry for the next 7 days and to sponge bathe until Thursday,  September 4th.   MEDICATIONS:  1. For pain, Vicodin/HR 10/661 every 4 to 6 hours, as needed for pain.  2. Actos 15 mg day.  3. Atenolol 25 grams daily.  4. Nexium 40 mg daily.  5. Symbiot 160/4.5, two puffs twice daily.  6. Clonazepam 1 mg tablet 1/2 tablet twice daily.  7. Hyoscyamine 0.375 twice daily next.  8. Spironolactone 25 mg daily as needed.  9. Medroxyprogesterone 10 mg daily.  10.Enulose 15 mL daily.  11.Patanol ophthalmic solution daily, 1 drop to both eyes daily.  12.Restasis ophthalmic solution, 1 drop both eyes twice daily.   LAB STUDIES:  This admission, taken August 25th:  White cells 8.3,  hemoglobin 13, hematocrit 36.9, platelets to 303, pro-time 12.2, INR  1.0.  Sodium is 138, potassium 3.5, chloride 102, carbonate 30, glucose  161, BUN is 4, creatinine 0.6.   Dictating greater than 35 minutes.      Maple Mirza, PA     GM/MEDQ  D:  01/14/2007  T:  01/15/2007  Job:  [id number]   cc:   Doylene Canning. Ladona Ridgel, MD  Fleet Contras, M.D.  Luella Cook, M.D.

## 2010-09-30 NOTE — Op Note (Signed)
NAMEQUETZAL, MEANY              ACCOUNT NO.:  [id number]   MEDICAL RECORD NO.:  [id number]          PATIENT TYPE:  AMB   LOCATION:  ENDO                         FACILITY:  [facility]   PHYSICIAN:  Barbette Hair. Arlyce Dice, MD,FACGDATE OF BIRTH:  [birth year]   DATE OF PROCEDURE:  02/09/2008  DATE OF DISCHARGE:  02/09/2008                               OPERATIVE REPORT   A 48-HOUR pH STUDY   A 48 hour pH study was done with a Bravo pH sensor.   RESULTS:  On day #1, there were 81 episodes of reflux, all in the  upright position.  Total time of pH of less than 4 was 631 minutes.  A  fraction of time with pH less than 4 was 57.4.  Total Demetrius Score  was 117.   There was no reading for day #2.   Findings are diagnostic of significant gastroesophageal reflux.      Barbette Hair. Arlyce Dice, MD,FACG  Electronically Signed     RDK/MEDQ  D:  02/15/2008  T:  02/15/2008  Job:  [id number]

## 2010-09-30 NOTE — Op Note (Signed)
NAMECLOA, BUSHONG              ACCOUNT NO.:  [id number]   MEDICAL RECORD NO.:  [id number]          PATIENT TYPE:  INP   LOCATION:  4708                         FACILITY:  [facility]   PHYSICIAN:  Doylene Canning. Ladona Ridgel, MD    DATE OF BIRTH:  10-31-1970   DATE OF PROCEDURE:  01/13/2007  DATE OF DISCHARGE:                               OPERATIVE REPORT   PROCEDURE PERFORMED:  Implantation of a single chamber defibrillator.   INDICATION:  Long QT syndrome with syncope and a malignant family  history of sudden cardiac death secondary to long QT syndrome.   I. INTRODUCTION:  The patient is a 40 year old woman who has a history  of ICD implantation several years ago.  The patient had a 6949 lead  which was subsequently removed in total at [facility] several  months ago.  She preferred to have her new system implanted at [facility]; and is here now for new ICD implantation.   II. PROCEDURE:  After informed consent was obtained, the patient was  taken to the diagnostic EP lab in the fasting state.  After the usual  preparation, 10 mL of contrast was injected into the left upper  extremity venous system demonstrating a patent left subclavian vein.  Approximately 40 mL of  lidocaine was infiltrated into the left  infraclavicular region.  A 7-cm incision was carried out over this  region.  Electrocautery was utilized to dissect down to the fascial  plane.  The subclavian vein was punctured; and the Medtronic model 6947  active fixation defibrillation lead, serial number [id number] was  advanced into the right ventricle.  Mapping was carried out at the final  site on the RV apex.  The R waves measured 10 mV and the pacing  impedance with the lead actively fixed was 676 ohms.  The threshold 0.7  volts at 0.5 milliseconds and 10 volts pacing did not stimulate the  diaphragm.   With these satisfactory parameters, the lead was secured to the  subpectoralis fascia with the figure-of-eight silk  suture; and the  sewing sleeve also secured with a silk suture.  Electrocautery was  utilized to make a subcutaneous pocket.  Kanamycin irrigation was  utilized to irrigate the pocket; and electrocautery was utilized to  assure hemostasis.  The Medtronic Maximo VR model 7232 single chamber  defibrillator, serial number p.r.n. [id number] was connected to the  defibrillation lead and placed back in the subcutaneous pocket.  The the  pocket was irrigated with Kanamycin and the incision closed with a layer  of 2-0 Vicryl followed by a layer of 3-0 Vicryl.  Benzoin was painted on  skin.  Steri-Strips were applied; and a pressure dressing was placed;  and the patient was returned to her room in satisfactory condition.   III. COMPLICATIONS:  There were no immediate procedure complications.   IV. RESULTS:  This demonstrates successful implantation of a Medtronic  single chamber defibrillator in a patient with syncope and a long QT  syndrome; and a malignant family history of sudden cardiac death.      Doylene Canning. Ladona Ridgel, MD  Electronically Signed    GWT/MEDQ  D:  01/13/2007  T:  01/14/2007  Job:  [id number]

## 2010-09-30 NOTE — Assessment & Plan Note (Signed)
[facility]                         ELECTROPHYSIOLOGY OFFICE NOTE   NAME:HERBINShaterica, Mcclatchy                       MRN:          [id number]  DATE:12/23/2006                            DOB:          06/10/1970    Ms. Jorgenson returns today for followup.  She is a very pleasant young  woman with long QT syndrome and syncope who is status post ICD  insertion.  The patient received one of the Sprint __________  4098  leads with her initial implant and insisted on having it removed.  I  referred her to [facility], to Dr. Sharol Harness for this, as I did not  feel like it was the best thing for the patient.  She ultimately went  ahead and had the device explanted without particular difficulty.  She  initially wore a life vest but then approximately 3 weeks, sent it back  to the company, as she did not care to wear it any longer.  She returns  today for followup.  She has had no syncope.  She denies chest pain.  She does have some tenderness over ICD incision site but otherwise no  specific complaints.   MEDICATIONS:  Actos, atenolol, Nasonex, Nexium, clonazepam, and  progesterone.   PHYSICAL EXAMINATION:  .  She is a pleasant, well-appearing young woman in no distress.  The blood pressure was 116/66, the pulse 84 and regular.  Respirations  were 18, and the weight was 198 pounds.  NECK:  No jugular venous distention.  LUNGS:  Clear bilaterally to auscultation.  No wheezes, rales, or  rhonchi are present.  CARDIOVASCULAR:  Regular rate and rhythm with normal S1 and S2.  EXTREMITIES:  No cyanosis, clubbing, or edema.  The pulses were 2+ and  symmetric.  NEUROLOGIC:  Alert and oriented x3.  Cranial nerves intact.   The ICD incision was healed nicely with small cheloid.   IMPRESSION:  1. Long QT syndrome.  2. Syncope.  3. Status post ICD with recent removal.   DISCUSSION:  It is time for the patient to have her device reimplanted.  I have told her that I  would be happy for her to go back to [facility] to have it redone with Dr. Sharol Harness or, alternatively, she can  have it done here.  She is considering all of the above and will call us  if she would like to get it done here.  Otherwise, she is instructed to  call Dr. Sharol Harness at [facility] for scheduling a reimplantation.    Doylene Canning. Ladona Ridgel, MD  Electronically Signed   GWT/MedQ  DD: 12/23/2006  DT: 12/23/2006  Job #: [id number]   cc:   Dr. Luella Cook, [facility]

## 2010-09-30 NOTE — Assessment & Plan Note (Signed)
[facility]                         ELECTROPHYSIOLOGY OFFICE NOTE   NAME:[name], [name]                       MRN:          [id number]  DATE:01/31/2007                            DOB:          [birth year]    [name] was seen today in the clinic on the 15th of September, 2008,  for a wound check of her newly implanted Medtronic Model [phone] Maximo.  Date of implant was January 13, 2007, for long QT.  On interrogation of  her device today her battery voltage is 3.21 with a charge time of 8  seconds.  R waves measured 6.9 millivolts with a ventricular capture  threshold of 1 volt at 0.4 milliseconds and a ventricular lead impedance  of 424 ohms.  Shock impedance is 42.  Underlying rhythm today was in  sinus rhythm at 88 beats a minute.  There were no episodes since implant  date, no changes were made in her parameters, her Steri-Strips were  removed, her wound is without redness or edema and she will be seen back  in December by Dr. Ladona Ridgel.      Loretta Harm, [name]  Electronically Signed      Doylene Canning. Ladona Ridgel, MD  Electronically Signed   PO/MedQ  DD: 01/31/2007  DT: 01/31/2007  Job #: [id number]

## 2010-09-30 NOTE — Assessment & Plan Note (Signed)
[facility]                         ELECTROPHYSIOLOGY OFFICE NOTE   NAME:HERBINLillieanna, Tuohy                       MRN:          [id number]  DATE:04/18/2007                            DOB:          March 22, 1971    Ms. Shillingford returns today for followup.  She is a very pleasant 40-year-  old woman whom I saw a couple of weeks ago in followup.  She had  previously had her 69/49 lead removed at [facility] and then  underwent a reinsertion of a new device at [facility] several months  ago.  She returns today for followup.  In the last week or so, she has  complained of increased tenderness and tingling and itching at her ICD  insertion site.  She denies fevers or chills.  She has been bothered by  a little bit of sinusitis.  Otherwise, she had no specific complaints  today.   MEDICATIONS:  1. Actos 15 daily.  2. Atenolol 25 daily.  3. Nasonex p.r.n.  4. Clonazepam p.r.n.  5. Multivitamins and other pain medications p.r.n.   EXAM:  She is a pleasant, well-appearing woman in no acute distress.  The blood pressure is 137/90; the pulse is 82 and regular; the  respirations were 18, and the weight was 198 pounds.  NECK:  No jugular venous distention.  LUNGS:  Clear bilaterally to auscultation.  There were no wheezes,  rales, or rhonchi.  Evaluation of her ICD insertion site demonstrated it  to be healing nicely with no erythema.  It was minimally tender to  palpation.  ABDOMEN:  Soft, nontender.  EXTREMITIES:  No edema.   IMPRESSION:  1. History of long QT syndrome with syncope.  2. Status post ICD insertion status post removal status post      reinsertion.  3. Itchiness and pain at her ICD insertion site.   DISCUSSION:  The patient has no evidence of any infection in this  location.  I have given her a prescription today for some topical  hydrocortisone to apply to the incision on a twice daily basis.  I have  also given her a prescription for  some Darvocet pain medications to take  for p.r.n. severe pain.  I will see her back as previously scheduled.  She is to call us for additional problems or concerns.     Doylene Canning. Ladona Ridgel, MD  Electronically Signed    GWT/MedQ  DD: 04/18/2007  DT: 04/18/2007  Job #: [id number]

## 2010-09-30 NOTE — Assessment & Plan Note (Signed)
[facility]                         ELECTROPHYSIOLOGY OFFICE NOTE   NAME:HERBINYanilen, Adamik                       MRN:          [id number]  DATE:02/17/2008                            DOB:          Sep 06, 1970    Ms. Burdette returns today for followup.  She is a very pleasant young  woman with a history of long QT syndrome and syncope status post ICD  insertion.  She has a very strong family history of sudden cardiac  death.  The patient had a [id number] Sprint Fidelis lead, which was removed  and she subsequently underwent reimplantation of her defibrillator about  1 year ago.  She returns today for followup.  She notes that she still  has intermittent pain at the site of her ICD insertion, though this has  improved from her initial implant.  She also notes that when she had her  device removed, a sheath was placed in her left groin and she continues  to have some intermittent pain from this.  She has otherwise been  stable.   Her medicines include Actos 15 mg daily, atenolol 25 a day, Nasonex 2  puffs a day, vitamin B12 shot, Nexium 40 twice a day, clonazepam 1 mg  half tablet twice daily, medroxyprogesterone p.r.n., and Aldactone 25 mg  p.r.n.   PHYSICAL EXAMINATION:  GENERAL:  She is a pleasant well-appearing young  woman in no acute distress.  VITAL SIGNS:  Blood pressure today was 130/76, the pulse was 70 and  regular, the respirations were 18, and the weight was 194 pounds.  NECK:  Revealed no jugular venous distention.  LUNGS:  Clear bilaterally to auscultation.  No wheezes, rales, or  rhonchi are present.  CARDIOVASCULAR:  Regular rate and rhythm.  Normal S1 and S2.  No  murmurs, rubs, or gallops were present.  ABDOMEN:  Soft, nontender.  Palpation of the ICD insertion site  demonstrated that it appeared to be healing nicely.  There was no  erythema.  There is minimal tenderness.  Examination of the left groin  area demonstrated no residual  tenderness, no evidence of a bruit or  thrill.  There were no masses in her left groin.  EXTREMITIES:  Demonstrated no edema.   Interrogation of her defibrillator demonstrates a Medtronic maximal, the  R-waves are 8, the impedance 480, the threshold 2.5 volts at 0.3  milliseconds, battery voltage was 3.19 volts.  Underlying rhythm was  sinus at 90.  The patient had multiple episodes of what are called  nonsustained VT, though these appear more likely to be sinus  tachycardia.  Today, we reprogrammed her defibrillator by increasing her  monitor zone from 150 to 180, 2 beats per minute.   IMPRESSION:  1. Long QT syndrome.  2. History of ICD lead removal secondary to the 6949 lead.  3. Chronic and residual pain, which is improved.   DISCUSSION:  Ms. Holzman is overall stable.  I have recommended that  things were fine and a little bit of Darvocet to help her with her pain.  We will see her back in the office in 1  year.     Doylene Canning. Ladona Ridgel, MD  Electronically Signed    GWT/MedQ  DD: 02/17/2008  DT: 02/17/2008  Job #: [id number]

## 2010-09-30 NOTE — Assessment & Plan Note (Signed)
[facility]                         ELECTROPHYSIOLOGY OFFICE NOTE   NAME:HERBINLenka, Zhao                       MRN:          [id number]  DATE:04/16/2008                            DOB:          1971-02-25    Ms. Leggette returns today for followup.  She is a very pleasant woman  with a history of long QT syndrome and a strong family history of sudden  cardiac death.  She had a 6949 lead, which was ultimately extracted with  her new device replaced over a year ago.  The patient had off-and-on  palpitations over the time but had an usually bad episode today where  her heart raced for over 10 minutes.  These episodes start suddenly, but  one day it seemed to stop gradually.  She did not have a syncope.  She  did have mild chest pressure with the episode.  She is here today for  additional evaluation.  Her episode of heart racing stopped prior to her  arrival in the office today.   Current medications include atenolol 25 daily.  She is also on Nexium.  Vitamin B12 shots, Actos 15 a day, Symbicort daily, medroxyprogesterone  daily.   On physical exam, she is a very well-appearing woman in no acute  distress.  The blood pressure was 130/90, the pulse was 100 and regular,  respirations were 18, the weight was 196 pounds.  Her neck revealed no  jugular venous distention.  Lungs are clear bilaterally to auscultation.  No wheezes, rales, or rhonchi are present.  Cardiovascular exam revealed  regular rate and rhythm, normal S1 and S2.  Abdominal exam was soft and  nontender.  Extremities demonstrated no edema.   Interrogation of her defibrillator demonstrates what appears to be a  narrow QRS tachycardia at rates of 180 beats per minute.  This appears  to be a narrow QRS complex, though her morphology discriminator  suggested that this was VT.   IMPRESSION:  1. Palpitations and documented what appears to be narrow QRS      tachycardia.  2. Long QT syndrome  with a strong family history of sudden cardiac      death.  3. Status post implantable cardioverter-defibrillator insertion.   DISCUSSION:  Ms. Haack is stable.  The mechanism of her tachycardia is  unclear, though I suspect the SVT, as she has no evidence of additional  structural heart disease besides a long QT syndrome that is her LV  function has previously been normal.  I have outlined several options  for Ms. Putman.  One would be to proceed with EP study and a catheter  ablation.  Second option would be to her wear a cardiac monitor for 3  weeks in hopes of catching more of these episodes.  A third option would  be a period of watchful waiting in an attempt to try to get a 12-lead  EKG of her tachycardia.  After discussing the pros and cons of all of  the above, the patient refuses catheter ablation and refuses to wear a  monitor and insists on proceeding with a  period of watchful waiting.  We will plan on seeing the patient back in  followup in several  months.  If her episodes of tachy-palpitations increase in frequency and  severity, then I will plan to see her back sooner as catheter ablation  would be required.     Doylene Canning. Ladona Ridgel, MD  Electronically Signed    GWT/MedQ  DD: 04/16/2008  DT: 04/17/2008  Job #: [id number]

## 2010-10-03 NOTE — Assessment & Plan Note (Signed)
[facility]                           ELECTROPHYSIOLOGY OFFICE NOTE   NAME:[name], [name]                       MRN:          [id number]  DATE:11/25/2005                            DOB:          1970/06/27    REFERRING PHYSICIAN:  Marinus Maw   HISTORY:  [name] returns today for followup.  She is a very pleasant  young woman with a history of congenital long Q-T syndrome and syncope. She  is status post ICD insertion.  The patient has had pain in her ICD insertion  site for some time with the device having been placed in November of 2006.  She notes that the pain has improved, but now her main complaint is that it  itches from time to time.  We tried her on a little over-the-counter  hydrocortisone cream, which has not appeared to help much.  She denies any  swelling or drainage from the incision.  She denies chest pain or shortness  of breath.   PHYSICAL EXAMINATION:  GENERAL:  On exam, she is a pleasant well-appearing  young woman in no acute distress.  VITAL SIGNS:  Blood pressure today was 116/70, the pulse 76 and regular,  respirations were 18 and the weight was 200 pounds.  NECK:  The neck revealed no jugular venous distention.  LUNGS:  Lungs were clear bilaterally to auscultation.  CARDIOVASCULAR:  Exam revealed a regular rate and rhythm with normal S1 and  S2.  EXTREMITIES:  Extremities demonstrate no edema.   INTERROGATION:  Interrogation of her defibrillator demonstrates a Medtronic  Maximo with R waves of 11, a pacing impedance of 480 ohms and a pacing  threshold of a volt at 0.2 msec.  Shocking impedance was 46 ohms.  The  battery voltage is 3.2 volts.  There are no intercurrent IC therapies.   IMPRESSION:  1.  Long Q-T syndrome.  2.  Syncope.  3.  Family history of sudden cardiac death secondary to #1.   DISCUSSION:  Overall, [name] is stable.  I have given her a prescription  for some higher-strength hydrocortisone  cream (2%) and asked her to follow  up with [place] in 1 year and 3 months with [facility].                                   Doylene Canning. Ladona Ridgel, MD   GWT/MedQ  DD:  11/25/2005  DT:  11/25/2005  Job #:  [id number]

## 2010-10-03 NOTE — Op Note (Signed)
[name], [name]             ACCOUNT NO.:  [id number]   MEDICAL RECORD NO.:  [id number]            PATIENT TYPE:   LOCATION:                                 FACILITY:   PHYSICIAN:  Doylene Canning. Ladona Ridgel, M.D.       DATE OF BIRTH:   DATE OF PROCEDURE:  06/05/2005  DATE OF DISCHARGE:                                 OPERATIVE REPORT   PROCEDURE PERFORMED:  Defibrillation threshold testing.   I. INTRODUCTION:  The patient is a 40 year old woman with long QT syndrome  and syncope; and a family history of sudden cardiac death. She underwent ICD  implantation several months ago and, at that time, the procedure was  complicated by a dye allergy to IV contrast. For this reason defibrillation  threshold testing was not carried out; and the patient is now referred for  defibrillation threshold testing.   II. PROCEDURE:  After informed consent was obtained, the patient was taken  to the diagnostic CP lab in the fasting state. After the usual preparation  she was sedated with fentanyl and Versed. VF was induced with a T wave shock  and a 15 joules shock was delivered which terminated ventricular  fibrillation and restored sinus rhythm. Five minutes was allowed to elapse  and a second DFT test carried out. At this time, VF was induced with the 50  Hz burst pacing, resulting in VF and a 10 joules shock was delivered, which  terminated ventricular fibrillation and restored sinus rhythm. At this  point, no additional defibrillation threshold testing was carried out; and  the patient was returned to her room in satisfactory condition.   III. COMPLICATIONS:  There were no immediate procedure complications.   IV. RESULTS:  This demonstrates successful defibrillation threshold testing  in a patient with long QT syndrome, syncope, and a strong family history of  sudden cardiac death.           ______________________________  Doylene Canning. Ladona Ridgel, M.D.     GWT/MEDQ  D:  06/05/2005  T:  06/05/2005   Job:  [id number]

## 2010-10-03 NOTE — Op Note (Signed)
NAMEORENE, ABBASI              ACCOUNT NO.:  [id number]   MEDICAL RECORD NO.:  [id number]          PATIENT TYPE:  INP   LOCATION:  NA                           FACILITY:  [facility]   PHYSICIAN:  Doylene Canning. Ladona Ridgel, M.D.  DATE OF BIRTH:  09/22/1970   DATE OF PROCEDURE:  03/20/2005  DATE OF DISCHARGE:                                 OPERATIVE REPORT   PROCEDURE PERFORMED:  Implantation of single-chamber defibrillator.   INDICATION:  Long Q-T syndrome with a family history of sudden cardiac death  and syncope.   I. INTRODUCTION:  The patient is a 40 year old woman who has a long history  of long Q-T syndrome with multiple family members dying suddenly.  She has a  mother with long Q-T syndrome and syncope.  The patient has had several  episodes of sudden unexplained syncope which last for seconds at a time.  Her Q-T interval has varied between 480 and over 550 milliseconds in the  past.  She is now referred for ICD implantation.  Of note, the patient had a  defibrillator recommended for least 2 years, but had declined until  recently, when she had a recurrent syncopal episode.   II. PROCEDURE:  After informed consent was obtained, the patient was taken  to the diagnostic EP lab in the fasting state.  After the usual preparation  and draping, intravenous fentanyl and midazolam were given for sedation.  Thirty milliliters of lidocaine were infiltrated into the left  infraclavicular region.  A 8-cm incision was carried out over this region  and electrocautery utilized to dissect down to the fascial plane.  Ten  milliliters of IV contrast were injected into the left upper extremity  venous system, which demonstrated a patent left subclavian vein.  It was  subsequently punctured and the Medtronic model 6949 65-cm active-fixation  defibrillation lead serial number [id number], was advanced into the right  ventricle and mapping was carried out.  During this time, the patient  developed pain in  her neck and began to cough.  Her heart rate increased up  to 140 or 150 beats per minute.  Her saturation dropped from the high 90s  down into the mid and low 90s and an allergic reaction to her IV contrast  was diagnosed.  She was subsequently treated with intravenous Benadryl and  Solu-Medrol.  She was about to receive intravenous epinephrine, but her  saturations improved and her heart rate stabilized, and her symptoms  resolved.  At this point, mapping was again carried out in the right  ventricle and at the final site near the RV apex, the R waves measured 18 mV  and the pacing impedance was 725 ohms with the lead actively affixed and a  pacing threshold of 0.6 volts at 0.5 milliseconds.  Ten-volt pacing did not  stimulate the diaphragm.  With the defibrillation lead in satisfactory  position, it was secured to subpectoralis fascia with a figure-of-eight silk  suture.  In addition, the sew-in sleeve was secured with silk suture.  Electrocautery was utilized to make a subcutaneous pocket.  The Medtronic  Maximo VR  model 7232, single-chamber defibrillator, serial number  [id number], was connected to the defibrillation lead and placed in the  subcutaneous pocket.  The generator was secured with a silk suture.  At this  point, because of her allergic reaction, it was deemed not appropriate to  proceed with defibrillation threshold testing and the incision was closed  with a layer of 2-0 Vicryl, followed by a layer of 3-0 Vicryl, followed by a  layer of 4-0 Vicryl.  Benzoin was painted on the skin, Steri-Strips were  applied, pressure dressing was placed and the patient was returned to her  room in satisfactory condition.   III. COMPLICATIONS:  The procedure was complicated by an IV reaction to  intravenous contrast resulting in an allergic reaction; this was  successfully treated.   IV. CONCLUSION:  This study demonstrates successful implantation of a  Medtronic single-chamber  defibrillator in a patient with long Q-T syndrome,  family history of sudden cardiac death and syncope.  The procedure was  complicated by an allergic reaction to intravenous contrast which was  successfully treated.  Defibrillation threshold testing did not get carried  out secondary to the above allergic reaction.           ______________________________  Doylene Canning. Ladona Ridgel, M.D.     GWT/MEDQ  D:  03/20/2005  T:  03/20/2005  Job:  [id number]

## 2010-10-03 NOTE — Assessment & Plan Note (Signed)
[facility]                            CARDIOLOGY OFFICE NOTE   NAME:[name], [name]                       MRN:          [id number]  DATE:07/19/2006                            DOB:          1971/03/26    CHIEF COMPLAINT:  Chest pain.   HISTORY OF PRESENT ILLNESS:  [name] is a 40 year old woman with long  QT syndrome who has a Medtronic ICD that was placed in 2006.  She has  developed an increase in chest pain since last week and was evaluated in  the emergency department 4 days ago.  She was ruled out for a myocardial  infarction and it was felt that her pain was musculoskeletal and she was  discharged after observation in the emergency department.  She presented  to the clinic today with chest pain and was brought in for evaluation.  She has had some degree of chest discomfort around her pacemaker site  ever since the time of implant.  She complains of increased pain when  she raises her left arm in the left lateral chest.  Her most recent pain  episode has involved pain just below the area where the pacemaker is.  There is not a pleuritic component.  There is also not an exertional  component to her pain.  Her pain generally lasts for 1-2 minutes and  then abates spontaneously.  She has been very worried about the increase  in pain over the past few days.   CURRENT MEDICATIONS:  1. Actos 15 mg daily.  2. Atenolol 25 mg daily.  3. Nasonex two puffs daily.  4. B12 shots monthly.  5. Nexium 40 mg twice daily.  6. BroveX 5 mL b.i.d.  7. Symbicort twice daily.  8. Clonazepam 1 mg one-half twice daily.  9. __________ 15 mL daily.  10.Hyoscyamine 0.375 mg twice daily.   ALLERGIES:  1. DEMEROL.  2. CONTRAST MEDIA.  3. CODEINE.   EXAMINATION:  GENERAL:  [name] is alert and oriented and in no acute  distress.  VITAL SIGNS:  Her weight is 210 pounds, blood pressure is 114/78, heart  rate is 59, respiratory rate is 12.  HEENT:  Normal.  NECK:  Normal carotid upstrokes without bruits.  Jugular venous pressure  is normal.  LUNGS:  Clear to auscultation bilaterally.  HEART:  Regular rate and rhythm without murmurs or gallops.  CHEST:  Chest wall is tender over the left chest to palpation.  There is  limitation in range of motion of the left arm.  ABDOMEN:  Soft, nontender, no organomegaly.  EXTREMITIES:  No clubbing, cyanosis, or edema.  Peripheral pulses are 2+  and equal throughout.   EKG  shows sinus bradycardia with a leftward axis and nonspecific T-wave  abnormality.  Her EKG is essentially unchanged from baseline.   ASSESSMENT:  This is a 40 year old woman with atypical chest pain that  is likely related to musculoskeletal pain.  I have a very low clinical  suspicion that this is related to underlying obstructive coronary artery  disease based on the highly atypical presentation as well as  the  patient's age of 38 years.  I do not think any testing is warranted as  the pretest probability is exceedingly low.   I have asked [name] to try a 1-week course of ibuprofen 600 mg three  times daily with food.  I think she likely has an inflammatory process  with either costochondritis or something related to her device, although  the device has been in for quite some time and this is probably less  likely.  Of note, she is going to [facility] for a second  opinion regarding lead recall later on this month.   [name] should follow up with her routine scheduled followup with Dr.  Ladona Ridgel.     Veverly Fells. Excell Seltzer, MD  Electronically Signed    MDC/MedQ  DD: 07/19/2006  DT: 07/19/2006  Job #: [id number]   cc:   Doylene Canning. Ladona Ridgel, MD  Gustavus Messing Orlin Hilding, M.D.  Fleet Contras, M.D.

## 2010-10-03 NOTE — Discharge Summary (Signed)
NAMEJAHARI, [name]              ACCOUNT NO.:  [id number]   MEDICAL RECORD NO.:  [id number]          PATIENT TYPE:  INP   LOCATION:  6525                         FACILITY:  [facility]   PHYSICIAN:  Doylene Canning. Ladona Ridgel, M.D.  DATE OF BIRTH:  [birth year]   DATE OF ADMISSION:  03/20/2005  DATE OF DISCHARGE:  03/21/2005                                 DISCHARGE SUMMARY   ALLERGIES:  CODEINE and DEMEROL; also now anaphylaxis with CONTRAST MEDIA.   DISCHARGE DIAGNOSES:  1.  Discharging day one, March 21, 2005, status post implantation of      Medtronic MAXIMO single- chamber cardioverter defibrillator.  2.  Family history of long QT with family members having sudden cardiac      death experiences.  3.  Anaphylaxis with contrast media. The patient given Solu-Medrol,      Benadryl, and Pepcid in the EP lab.   SECONDARY DIAGNOSES:  1.  Recent history of weakness and fatigue. TSH which was taken February 27, 2005, was 1.00.  2.  Intermittent syncope.  3.  Diabetes.   PROCEDURE:  On March 20, 2005, implantation of Medtronic MAXIMO single-  chamber ICD for patient with long QT syndrome by Dr. Lewayne Bunting. There was  acceptable V pacing and sensing. No defibrillator threshold secondary to  allergic reaction to IV contrast.   DISCHARGE DISPOSITION:  [name] discharging day #1 after  implantation of Medtronic cardioverter defibrillator. She did have  anaphylactic reaction with contrast media in the EP lab and she had some  post procedure pruritus which required continuation of IV steroids as well  as IV Benadryl. She will be discharged when these symptoms have resolved. A  chest x-ray will also be examined to make sure that the lead is in  appropriate position and the device will be interrogated on the morning of  her discharge. Incision will be inspected and if there is no hematoma,  drainage, or erythema the patient will be ready to go home.   She will be discharged on the  following medications:  1.  Nexium 40 mg daily.  2.  Atenolol 25 mg daily.  3.  Avandia 4 mg daily.  4.  Naprosyn 500 mg daily.  5.  Ultram p.r.n.  6.  Advair 250/50 two puffs b.i.d.   FOLLOWUP:  With [facility], [address] at the ICD  clinic on Monday, November 20th at 9:15 a.m. and she will see Dr. Ladona Ridgel on  July 07, 2005, at 12:30. She is asked to keep her incision dry for the  next seven days, sponge bathe until Friday, November 10th. She is asked not  to drive for the next week and she is asked not lift anything more than 10  pounds for the next four weeks.   BRIEF HISTORY:  The patient is a 40 year old female who has a  history of  syncope and a familial long QT syndrome. Several family members have died  suddenly. Her mother has a defibrillator. Lately, the patient has been  fatigued and weak. A TSH was  obtained and was 1.0, but she does deny  shortness of breath or chest pain. Dr. Ladona Ridgel has discussed treatment  options for this patient. He has recommended these over a period of several  years. The patient now agrees to have defibrillator implanted. The risks and  benefits of this procedure have been explained.   HOSPITAL COURSE:  The patient presents electively on November 3rd. She  underwent implantation of the defibrillation. During the procedure she  experienced a feeling as her breath was cutting off. She was given IV Solu-  Medrol, IV Benadryl, and IV Pepcid. These symptoms have abated somewhat.  Implantation of the defibrillator was without further incident. However,  post procedurally by about 8  hours the patient began with pruritus without  evidence of welts. The patient was again treated with IV steroids and IV  Benadryl with some relief to her symptoms. The patient will be given  Percocet for pain control. The patient, discharging day #1, her incision was  without hematoma. She was discharged on her preoperative medications.  Mobility  of the left upper extremity has been explained to the patient.      Maple Mirza, P.A.    ______________________________  Doylene Canning. Ladona Ridgel, M.D.    GM/MEDQ  D:  03/20/2005  T:  03/21/2005  Job:  [id number]

## 2010-10-03 NOTE — Assessment & Plan Note (Signed)
[facility]                         GASTROENTEROLOGY OFFICE NOTE   NAME:[name], [name]                       MRN:          [id number]  DATE:07/15/2006                            DOB:          11/18/1970    PROBLEM:  Nausea and abdominal pain.  [name] has returned for  reevaluation.  Approximately 4 days ago, she developed diffuse abdominal  pain with nausea and vomiting.  She has had profuse diarrhea.  She  denies fever, chills, or myalgias.  Nausea has decreased, though is  still present.  Pain was originally in the mid epigastrium and now is  more in the periumbilical area.  It is associated with frequent  diarrhea.  Medication calendar not changed.   MEDICATIONS:  Actos.  Atenolol.  Nasonex.  Nexium.  Prorex.  Symbicort.  Clonazepam.  Amylase.  B12 injections.   EXAM:  Pulse 72, blood pressure 110/80, weight 210.  ABDOMEN:  She has minimal diffuse tenderness without guarding or  rebound.  There are no abdominal masses or organomegaly.  HEENT:  EOMI. PERRLA. Sclerae are anicteric.  Conjunctivae are pink.  NECK:  Supple without thyromegaly, adenopathy or carotid bruits.  CHEST:  Clear to auscultation and percussion without adventitious  sounds.  CARDIAC:  Regular rhythm; normal S1 S2.  There are no murmurs, gallops  or rubs.  EXTREMITIES:  Full range of motion.  No cyanosis, clubbing or edema.  RECTAL:  Deferred.   IMPRESSION:  Probable acute gastroenteritis.   RECOMMENDATION:  Levbid 0.375 mg twice a day as needed for pain.     Barbette Hair. Arlyce Dice, MD,FACG  Electronically Signed    RDK/MedQ  DD: 07/15/2006  DT: 07/15/2006  Job #: [id number]

## 2010-10-03 NOTE — Assessment & Plan Note (Signed)
[facility]                         ELECTROPHYSIOLOGY OFFICE NOTE   NAME:HERBINJennel, Mara                       MRN:          [id number]  DATE:04/28/2006                            DOB:          [birth year]    Ms. Ridener returns today for followup.  She is a very pleasant young  woman with a history of long QT syndrome and a strong family history for  sudden cardiac death.  She is status post ICD insertion, after she had  had multiple episodes of syncope, which, in retrospect, have turned out  to be seizures.  Unfortunately, she does have a family history of  seizure disorder, as well, and she has been followed by Dr. Orlin Hilding for  her seizures.  She is stressed because her oldest daughter has been  given the diagnosis of long QT syndrome.  Her first child died suddenly  at age 27 months.   The patient returns today for followup.  She notes some tenderness at  the site of her ICD insertion site, which she has had for quite some  time.  She otherwise denied chest pain or shortness of breath.   PHYSICAL EXAMINATION:  She is a pleasant, well-appearing, young woman,  in no acute distress.  The blood pressure was 120/84, the pulse was 82  and regular, the respirations were 18, the weight was 210 pounds.  NECK:  Revealed no jugular venous distention.  LUNGS:  Clear bilaterally to auscultation.  CARDIOVASCULAR EXAM:  Revealed a regular rate and rhythm with normal S1  and S2.  The ICD insertion site demonstrated no hematoma, no erythema  and there were really minimal amounts of tenderness to palpation.  She  had a very small keloid.  EXTREMITIES:  Demonstrate no edema.   The EKG demonstrates sinus rhythm with prolongation of the QT interval.   IMPRESSION:  1. Long QT syndrome with a family history of sudden cardiac death.  2. Syncope, likely secondary to seizure disorder.  3. Status post ICD insertion.   DISCUSSION:  Overall, Ms. Bodi is stable from an  arrhythmia  perspective.  We will plan to see her back in the office in three months  for ICD check and in six months back with me.     Doylene Canning. Ladona Ridgel, MD  Electronically Signed    GWT/MedQ  DD: 04/28/2006  DT: 04/28/2006  Job #: [id number]   cc:   Santina Evans A. Orlin Hilding, M.D.

## 2010-10-03 NOTE — Letter (Signed)
June 02, 2006    Ms. [facility]  [address]  [place], [place]  [address]   RE:  [name], [name]  MRN:  [id number]  /  DOB:  08/06/1970   Ms. Zollner:   This is a letter indicating the results for your CT scan of your chest  that you had on May 31, 2006.  I attempted to contact you but your  phone has been disconnected.  The CT scan showed the pulmonary nodules  are unchanged in over 4 years, dating back to 2003, and are now  considered benign.  There is no evidence of malignancy and no further CT  scanning or biopsy is indicated.    Sincerely,      Charlcie Cradle. Delford Field, MD, [facility]  Electronically Signed    PEW/MedQ  DD: 06/02/2006  DT: 06/03/2006  Job #: [id number]

## 2010-10-03 NOTE — H&P (Signed)
NAMEETOSHA, WETHERELL              ACCOUNT NO.:  [id number]   MEDICAL RECORD NO.:  [id number]          PATIENT TYPE:  INP   LOCATION:  2899                         FACILITY:  [facility]   PHYSICIAN:  Doylene Canning. Ladona Ridgel, M.D.  DATE OF BIRTH:  03/24/1971   DATE OF ADMISSION:  06/05/2005  DATE OF DISCHARGE:                                HISTORY & PHYSICAL   PRIMARY CARE PHYSICIAN:  Dr. Concepcion Elk   PULMONOLOGIST:  Dr. Delford Field   ELECTROPHYSIOLOGIST:  Dr. Lewayne Bunting   Patient has anaphylaxis with IV DYE.  She also has allergies to CODEINE and  DEMEROL.   PRESENTING CIRCUMSTANCE:  I am here for my defibrillator.   HISTORY OF PRESENT ILLNESS:  Ms. Mcconaughey is a 40 year old female with  familial history of LONG QT.  She had ICD implanted March 20, 2005.  It is  a Medtronic MAXIMO device.  Defibrillator threshold study was postponed  secondary to an allergic reaction at that time.  She was anaphylactic with  IV contrast dye.  She presents back for defibrillator threshold study today.   BUT...she also has a major complaint regarding pain at the ICD pocket.  As a  pain gauge she is unable to wear a bra immediately after the procedure  November 3 which makes sense.  She started wearing it December 15 and was  able successfully to wear it until Christmas December 25.  But after  Christmas the area became more and more tender.  Now she says no way that  I can wear a bra.  The area is exquisitely tender to the touch.  It is not  overly swollen.  The skin is without erythema, though the incision seems  somewhat crimson and highlighted.  Atop the ICD the skin is numb.  All  around the device, however, it is very tender.  Patient feels that perhaps  it has moved as well.  She also complains of episodic chest heaviness  accompanied by palpitation and diaphoresis of sudden onset that lasts a few  minutes.  Chest pressure rises up into the neck.   MEDICATIONS:  1.  Atenolol 25 mg daily.  2.  Nexium 40  mg twice daily.  3.  Furosemide 10 mg daily.  4.  Reglan 10 mg daily.  5.  Avandia 2 mg daily.  6.  Amaryl 4 mg daily.  7.  Lyrica daily.  8.  Naprosyn daily.  9.  Patanol eye drops daily for dry eyes.  10. Restasis eye drops daily for dry eyes.  11. Advair inhaler 250/50 two puffs daily.   SOCIAL HISTORY:  The patient lives in [place] with her parents.  She  does not partake of alcoholic beverages, tobacco, or drugs.  She is  currently disabled.  She has three children.   FAMILY HISTORY:  Mother is living.  She has an ICD implanted.  She is age  44.  Father unknown.  She lives with her mother and step-father.  She does  not have siblings.  She does have one aunt who has implanted ICD and one  cousin with ICD.  REVIEW OF SYSTEMS:  Patient complains of fevers, chills, and night sweats.  She has weight change both up and down, very labile.  No adenopathy.  HEENT:  Occasional vertigo.  INTEGUMENT:  No rashes or non-healing ulcerations.  CARDIOPULMONARY:  Chest pain described as heavy, sudden onset radiating up  into the neck with diaphoresis lasting a few minutes and accompanied by  palpitation.  She is short of breath with dyspnea on exertion.  She can only  walk 100 yards before she can stop to rest.  She does experience paroxysmal  nocturnal dyspnea.  She uses two pillows at night.  She has occasional  edema.  UROGENITAL:  Patient gets up to urinate twice a night.  NEUROLOGIC:  Patient is fatigued and weak.  Has spent the last week in bed from just  overwhelming fatigue.  Is similar to complaints from pain in the hips to  feeling of sandpaper in the palms of the hands and in feet.  ENDOCRINE:  Patient has diabetes diagnosed about a year ago but she already has symptoms  of neuropathy.  She has heat intolerance when she has these onset of fevers  and chills, even a cold day she is intolerant and has to take off her  jacket.  Arthralgias with chronic bilateral hip pain.  Other  systems  negative.   PHYSICAL EXAMINATION:  VITAL SIGNS:  Temperature 97.9, pulse 75, blood  pressure 140/87, oxygen saturation 98% room air.  Height 5 feet 7 inches,  weight 201.  GENERAL:  The patient is alert and oriented x3.  She has discomfort at the  ICD site as described above.  HEENT:  Pupils are equal, round, and reactive to light.  Extraocular  movements are intact.  Sclerae are clear.  Nares without discharge.  NECK:  Supple without carotid bruits auscultated.  No thyromegaly.  No  jugular venous distention.  HEART:  Regular rate and rhythm with normal S1, S2.  No murmur.  LUNGS:  Clear to auscultation, percussion bilaterally.  ABDOMEN:  Soft, mildly obese.  No rebound or guarding.  EXTREMITIES:  No evidence of clubbing, cyanosis, or edema.  Dorsalis pedis  pulses are 4/4 bilaterally.  Radial pulses 4/4 bilaterally.  MUSCULOSKELETAL:  No joint deformity, effusions, kyphosis, scoliosis.  NEUROLOGIC:  Cranial nerves II-XII grossly intact.  No focal neurologic  deficits.   Because of the recent feeling of weakness and tiredness patient laboratory  studies were obtained in addition to the fact that patient complaining of  exquisite pain at the ICD site.  CBC, BMET, and TSH.  No electrocardiogram.  No chest x-ray done this admission.   1.  Admitted.  Patient needs defibrillator threshold study.  This was      aborted March 20, 2005 secondary to anaphylactic reaction to dye.  2.  Exquisite tenderness at ICD pocket.  3.  Implantation of Medtronic [facility] cardioverter defibrillator March 20, 2005 for long QT syndrome.  4.  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  5.  Sarcoidosis diagnosed three years ago followed by Dr. Delford Field.  6.  Chronic fatigue/fever and chills.  7.  Decreased exercise tolerance.  Walks 100 yards to dyspnea and must rest. 8.  Sudden onset of palpitations/diaphoresis/chest pressure which radiates      up into the throat  and fatigue after the event has passed after two to      three minutes.   PLAN:  1.  Admit for defibrillator  threshold study.  2.  Patient possibly has pocket infection.  Patient may need exploration or      revision of the pocket.  3.  Possible Holter monitor as outpatient.  4.  Patient is not taking all of her medications due to financial issues.      It would be possible to discharge to work out which medications would be      suitable for her that she could get at the $4 value at [place].  This      might include substituting Metformin for Avandia and Amaryl and starting      sulfonamide hypoglycemic as well as changing Nexium to Prilosec.      Maple Mirza, P.A.    ______________________________  Doylene Canning. Ladona Ridgel, M.D.    GM/MEDQ  D:  06/05/2005  T:  06/05/2005  Job:  [id number]

## 2010-10-03 NOTE — Op Note (Signed)
   [name], [name]                          ACCOUNT NO.:  [id number]   MEDICAL RECORD NO.:  [id number]                   PATIENT TYPE:  AMB   LOCATION:  CARD                                 FACILITY:  [facility]   PHYSICIAN:  Casimiro Needle B. Sherene Sires, M.D. [facility]           DATE OF BIRTH:  [birth year]   DATE OF PROCEDURE:  03/09/2002  DATE OF DISCHARGE:                                 OPERATIVE REPORT   PREOPERATIVE DIAGNOSIS:  Fiberoptic bronchoscopy with transbronchial biopsy  of the right lower lobe.   HISTORY AND INDICATIONS:  This is a 40 year old black female with suspected  sarcoid.  Bronchoscopy is being performed for tissue diagnosis if possible.  She agreed to the procedure after a full discussion of the risks, benefits,  and alternatives.   DESCRIPTION OF PROCEDURE:  The right naris and oropharynx were liberally  anesthetized with 10% lidocaine spray.  The right naris was additionally  prepared with 2% lidocaine jelly.  The patient was premedicated with a total  of 2.5 mg of IV Versed and 50 mg of IV Demerol with adequate sedation and  cough suppression.   The right naris was easily cannulated with good visualization of the  oropharynx and larynx.  No apparent airway abnormalities.  The cords moved  normally.   Using additional 1% lidocaine as needed, the entire tracheobronchial tree  was explored bilaterally with the following findings:  All the airways were  normal.   Procedure:  Using a wedge position in several of the basilar segments of the  right lower lobe, multiple transbronchial biopsies were obtained with good  tissue.  The right middle lobe was additionally lavaged for AFB and fungal  stain and culture and cytology.   The patient tolerated the procedure well.   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   IMPRESSION:  Await the above studies.                                               Charlaine Dalton. Sherene Sires, M.D. [facility]    MBW/MEDQ  D:  03/09/2002  T:  03/10/2002  Job:   [id number]

## 2010-10-03 NOTE — Assessment & Plan Note (Signed)
[facility]                             PULMONARY OFFICE NOTE   NAME:[name], [name]                       MRN:          [id number]  DATE:06/24/2006                            DOB:          1970-12-29    The patient is a 40 year old African-American female patient of Dr.  Delford Field, who has a known history of allergic rhinitis and asthmatic  bronchitis with vocal cord dysfunction syndrome, who presents for an  acute office visit.  The patient complains of a 3-day history of nasal  congestion, hoarseness, sore throat and cough.  The patient denies any  purulent sputum, fever, chest pain, shortness of breath or recent travel  or antibiotics use.  The patient is maintained on Symbicort 160/4.5 two  puffs twice daily, Nasonex two puffs daily, and BroveX one teaspoon  b.i.d.   PAST MEDICAL HISTORY:  Reviewed.   CURRENT MEDICATIONS:  Reviewed.   PHYSICAL EXAMINATION:  GENERAL:  The patient is a pleasant female in no  acute distress.  VITAL SIGNS:  She is afebrile with stable vital signs.  O2 saturation is  94% on room air.  HEENT:  Her nasal mucosa was erythematous.  Nontender sinuses.  Posterior pharynx with mild erythema, no exudate.  NECK:  Supple without adenopathy.  LUNGS:  Lung sounds are clear to auscultation bilaterally.  CARDIAC:  Regular rate.  ABDOMEN:  Soft and nontender.  EXTREMITIES:  Warm without any edema.   IMPRESSION AND PLAN:  Acute upper respiratory infection and pharyngitis.  The patient is to begin Mucinex DM twice daily.  May use Tussionex as  needed for cough one teaspoon q.12h. p.r.n.  The patient is to do salt  water gargles.  Tylenol p.r.n.  Strep test is pending at time of  dictation.  The patient will follow back up with Dr. Delford Field as scheduled  or sooner if needed.      Rubye Oaks, NP  Electronically Signed      Charlcie Cradle Delford Field, MD, [facility]  Electronically Signed   TP/MedQ  DD: 06/24/2006  DT: 06/24/2006  Job  #: [id number]

## 2010-10-03 NOTE — Op Note (Signed)
[name], [name]              ACCOUNT NO.:  [id number]   MEDICAL RECORD NO.:  [id number]          PATIENT TYPE:  AMB   LOCATION:  SDC                           FACILITY:  [facility]   PHYSICIAN:  Kathreen Cosier, M.D.DATE OF BIRTH:  [birth year]   DATE OF PROCEDURE:  10/07/2005  DATE OF DISCHARGE:                                 OPERATIVE REPORT   PREOPERATIVE DIAGNOSIS:  Dysfunctional uterine bleeding.   PROCEDURE:  Hysteroscopy, dilatation and curettage, with NovaSure ablation.   Under general anesthesia, patient in the lithotomy position, the perineum  and vagina were prepped and draped, the bladder emptied with a straight  catheter.  Bimanual exam revealed the uterus to be top normal size, negative  adnexa, and there was no prolapse.  A speculum placed in the vagina,  anterior lip of the cervix was grasped with a tenaculum and the cervix  curetted and a small amount of tissue obtained.  Endometrial cavity sounded  to 9 cm and the cervical length was measured with a Hegar dilator at 4 cm,  making a cavity length of 5 cm.  The cervix was dilated with a #27 Shawnie Pons, a  diagnostic hysteroscope inserted.  The cavity was normal.  Sharp curettage  was then performed.  A large amount of tissue was obtained.  The NovaSure  device was inserted in the cavity and the cavity integrity test passed.  An  ablation was performed for 1 minute 25 seconds at 124 watts.  Hysteroscopy  was then repeated and the cavity was noted to be totally ablated.  The fluid  deficit was 10 mL.  The patient tolerated the procedure well, taken to the  recovery room in good condition.           ______________________________  Kathreen Cosier, M.D.     BAM/MEDQ  D:  10/07/2005  T:  10/07/2005  Job:  [id number]

## 2010-10-03 NOTE — Assessment & Plan Note (Signed)
[facility]                               PULMONARY OFFICE NOTE   NAME:[name], [name]                       MRN:          [id number]  DATE:12/08/2005                            DOB:          07/31/1970    Ms. [name] is a 40 year old African American female with history of allergic  rhinitis, moderate persistent asthma, diabetes, reflux disease, still having  some cough and burning in the throat, but the cough overall is improved.  Does note some soreness in the throat when she takes the Advair.  Her throat  becomes irritated.  She is on the Nasonex 2 sprays each nostril daily,  Brovex 5 cc b.i.d.  Other maintenance meds listed in the chart are correct  as reviewed.   PHYSICAL EXAMINATION:  VITAL SIGNS:  Temp 98, blood pressure 124/80, pulse  97, saturation 99% on room air.  CHEST:  Showed diminished breath sounds with prolonged expiratory phase, no  wheeze or rhonchi.  CARDIAC:  Exam showed a regular rate and rhythm without S3, normal S1, S2.  ABDOMEN:  Soft, nontender, bowel sounds active.  EXTREMITIES:  Showed no edema or clubbing.   IMPRESSION:  Vocal cord dysfunction syndrome with postnasal drip-induced  cough, and reflux disease also exacerbating cough, with history of previous  sarcoidosis involving the mediastinal nodes.   PLAN:  Switch to Symbicort 160/4.5 at 2 sprays b.i.d., discontinuing further  Advair, maintaining Nexium b.i.d., Brovex b.i.d. and Tussionex p.r.n.  We  will see the patient back in followup in six weeks.                                   Charlcie Cradle Delford Field, MD, [facility]   PEW/MedQ  DD:  12/08/2005  DT:  12/09/2005  Job #:  [id number]   cc:   Kathreen Cosier, MD

## 2010-10-03 NOTE — Assessment & Plan Note (Signed)
[facility]                         ELECTROPHYSIOLOGY OFFICE NOTE   NAME:[name], [name]                       MRN:          [id number]  DATE:05/20/2006                            DOB:          Jun 02, 1970    [name] returns today with her mother.  She is a very pleasant young  woman with long QT segment syncope and a strong family history of sudden  cardiac death, who is status post ICD insertion just over a year ago.  The patient was seen by me a month ago and at that time we spent an  extensive amount of time discussing the recent Medtronic recall on the  6949 Sprint Fidelis lead.  After careful discussion and reassurance, I  thought that she was in fact okay with her very slight increase in lead  failure.  Unfortunately, the patient has become increasingly anxious and  worried and states that she in unable to sleep and had difficulty  thinking secondary to concerns and worries about her defibrillator lead  malfunctioning.   PHYSICAL EXAMINATION:  On physical examination today, she is a pleasant,  anxious appearing young woman in no distress.  The blood pressure is  132/100, the pulse was 72 and regular, respirations were 18, the weight  was 211 pounds.  NECK:  No jugular venous distention.  LUNGS:  Clear bilaterally to auscultation, without wheezes, rales or  rhonchi.  CARDIAC:  Regular rate with normal S1 and S2.  EXTREMITIES:  Demonstrated no edema.   IMPRESSION:  1. Long QT syndrome with history of syncope.  2. Status post ICD insertion utilizing the Sprint Fidelis lead      presently on recall.   DISCUSSION:  Today, again I spent an extensive amount of time with the  patient and her mother, outlined the very small increased risk for lead  failure, specifically that there is only 2 or 3 chances in 100 of lead  failure in the next year as her present defibrillator lead is working  normally.  I also spent a large amount of time  discussing the issues  regarding lead extraction or lead capping and lead revision.  Basically,  I have recommended that we not proceed with lead revision or lead  extraction and insertion of a new lead.  Despite my recommendation and  despite the extensive discussion about the increased risk of doing  anything else but watchful waiting, the patient is still insistent on  having her lead removed and a new lead placed.  I have asked that she  wait for 4 weeks and consider this, and again I have encouraged her not  to proceed with lead removal.  I have also offered her a chance for a  second opinion so that she can hear again the recommendations of another  electrophysiologist.  At the present time she is uninterested in doing  this.  I will plan to see her back in 4 to 5 weeks.  If she continues to  insist on having her lead revised, then we would consider doing this,  albeit at increased risk which the patient very  carefully understands.     Doylene Canning. Loretta Ridgel, MD  Electronically Signed   GWT/MedQ  DD: 05/20/2006  DT: 05/20/2006  Job #: [id number]

## 2010-10-03 NOTE — Assessment & Plan Note (Signed)
 [facility]                               PULMONARY OFFICE NOTE   NAME:[name], Piggee                       MRN:          [id number]  DATE:02/11/2006                            DOB:          1970/11/03    [name] is a 40 year old African American female, history of asthmatic  bronchitis, vocal cord dysfunction syndrome, severe reflux disease,  prolonged QT interval, irritable bowel syndrome.  She comes in today still  having some chest tightness and burning and dry cough.  Her level of  dyspnea, though, is improved since being on the Symbicort 160/4.5 at 2  sprays b.i.d., maintains Brovex 5 mL b.i.d., Nexium 40 mg b.i.d., Nasonex 2  sprays each nostril daily.   PHYSICAL EXAMINATION:  Temp 97, blood pressure 110/80, pulse 78, saturation  97% on room air.  CHEST:  Showed diminished breath sounds with prolonged expiratory phase, no  wheeze or rhonchi noted.  CARDIAC:  Exam showed a regular rate and rhythm without S3, normal S1, S2.  ABDOMEN:  Soft, nontender.  EXTREMITIES:  Showed no edema or clubbing.  SKIN:  Clear.  NEUROLOGIC:  Exam was intact.  HEENT:  Exam showed no jugular venous distention, no lymphadenopathy.   IMPRESSION:  1. Asthmatic bronchitis with vocal cord dysfunction syndrome.  2. Allergic rhinitis.   PLAN:  The plan for this patient is to maintain:  1. Symbicort 2 sprays b.i.d.  2. Brovex 5 mL b.i.d.  3. Nexium 40 mg b.i.d.  4. Nasonex 2 sprays each nostril daily.   The plan for this patient is to maintain inhaled medicines as currently  dosed, and we will see the patient back in return followup in four months.       Charlcie Cradle Delford Field, MD, FCCP     PEW/MedQ  DD:  02/11/2006  DT:  02/13/2006  Job #:  [id number]

## 2010-10-03 NOTE — Discharge Summary (Signed)
NAMESHERIAN, [name]              ACCOUNT NO.:  [id number]   MEDICAL RECORD NO.:  [id number]          PATIENT TYPE:  INP   LOCATION:  2899                         FACILITY:  [facility]   PHYSICIAN:  Doylene Canning. Ladona Ridgel, M.D.  DATE OF BIRTH:  09-Jul-1970   DATE OF ADMISSION:  06/05/2005  DATE OF DISCHARGE:  06/05/2005                                 DISCHARGE SUMMARY   ALLERGIES:  CODEINE, DEMEROL, and anaphylaxis with IV DYE.   Dr. Ladona Ridgel has seen her today.   PRINCIPAL DIAGNOSES:  1.  Implant of cardioverter defibrillator, Medtronic Maximo, March 20, 2005, for long QT syndrome.  2.  Defibrillator threshold study aborted, March 20, 2005, secondary to      anaphylactic reaction to dye.  3.  Defibrillator threshold study less than or equal to 10 joules, June 05, 2005.  4.  The patient complaining of continued exquisite tenderness at pacer/ICD      pocket;  however, no evidence of infection.   SECONDARY DIAGNOSES:  1.  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  2.  Sarcoidosis diagnosed three years, followed by Dr. Delford Field.  3.  Chronic fatigue, fever, and chills.  4.  Decreased exercise tolerance, cannot walk more than 100 yards without      resting.  5.  Sudden onset of palpitations, diaphoresis, and chest pressure with      fatigue following the episode.   PROCEDURE:  June 05, 2005, defibrillator threshold study, Doylene Canning.  Ladona Ridgel, M.D.   BRIEF HISTORY:  [name] is a 40 year old female with a familial history  of long QT syndrome.  She had an ICD implanted, March 20, 2005.  Defibrillator threshold study was postponed secondary to an allergic  reaction to dye.  She returns back today for a defibrillator threshold  study.   The patient is complaining of continued pain at the ICD pocket which has  apparently become progressively worse during the month of January, such that  she is unable to wear a bra.  Apparently, the area is exquisitely tender to  touch.  It is  not overly swollen on examination.  The skin is without  erythema.  The incision seems some crimson.  Atop the cardioverter  defibrillator, the skin is numb but all around the device is very tender.  The patient feels in addition that the device may have moved, although there  is n particular evidence of this.  The patient also complains of episodes of  a chest heaviness accompanied by palpitation and diaphoresis.  These are of  sudden onset, last a few minutes. The pressure from the chest travels up to  the neck.  She is fatigued after these episodes abort.   HOSPITAL COURSE:  The patient presented electively on June 05, 2005.  She  underwent defibrillator threshold study without complications or  discharging.  The patient does relate a history of inability to pay for her  medications.  These were reviewed with her.  It is mentioned that [facility]  offers the following medications for $4 for a month's supply:  Atenolol,  furosemide, Reglan, all available for $4 each on a prescription.  Nexium  could be changed to Prilosec and obtained for  $4 a month.  She is also  Amaryl and Avandia which are not generic and not available in this manner.  A substitution could be made with Metformin and Glucotrol.  The patient is  asked to follow up with [name], M.D. regarding this.  It is recommend  that the patient apply hydrocortisone cream to the incision daily.  She has  an appointment to see Dr. Ladona Ridgel at [facility] on July 07, 2005  at 12:30.   DISCHARGE MEDICATIONS:  1.  Atenolol 25 mg daily.  2.  Nexium 40 mg twice daily.  3.  Furosemide 10 mg daily.  4.  Reglan 10 mg daily.  5.  Avandia 400 mg daily.  6.  Amaryl 4 mg daily.  7.  Lyrica daily as before this admission.  8.  Naprosyn daily.  9.  Patanol and Restasis eyedrops daily.  10. Advair inhaler 250/50 1 inhalation twice daily.   LABORATORY STUDIES THIS ADMISSION:  Complete blood count:  White cells 7.3,   hemoglobin 13.6, hematocrit 39.5, platelets 277,000.  Serum electrolyzes:  Sodium 136, potassium 3.9, chloride 105, carbonate 25, BUN 5, creatinine  0.7, glucose 112.  TSH of pending.      [name], P.A.    ______________________________  Doylene Canning. Ladona Ridgel, M.D.    GM/MEDQ  D:  06/05/2005  T:  06/06/2005  Job:  [id number]   cc:   Doylene Canning. Ladona Ridgel, M.D.  [address]  [address]  [place]  [place] [address]   [name], M.D.  Fax: [phone]   [name], M.D. [facility]  [address]  [place]  [place] [address]

## 2010-10-03 NOTE — Letter (Signed)
June 17, 2006    Fleet Contras, M.D.  [address]  [place]  [place] [address]   RE:  [name], [name]  MRN:  [id number]  /  DOB:  09/26/1970   Dear Dorma Russell:   [name] comes in today.  She was to see Dr. Ladona Ridgel, to discuss  further the re-extraction of her 6949 lead which is on Medtronic recall.  Unfortunately, there is a mix-up on her appointment, so she came in and  I spoke with her instead.  I mentioned two things her.  One was that  there were new data hopefully forthcoming.   However, as we discussed things further, clearly she is under a great  deal of psychosocial stress.  She has three children that she is raising  by herself.  She describes being at home and being in bed most of the  day, when she is not required to be giving specific care to her  children.  This is further aggravated by the fear that is related to  this recall lead.  It is this which is prompting her to thinking about  having it extracted, not withstanding the risks.   I mentioned that I would talk to Dr. Ladona Ridgel about this.  I have also  suggested, however, that she think in terms of pursuing some assistance  in the diagnosis and/or treatment of possible depression, which sounds  like it may be aggravated by this lead issue, but it is not clearly  caused by the lead issue.  She is to let us know over the next 24 hours  if she would like to pursue some diagnostic strategy apart from  following up with you.   If there is any assistance we can give, please do not hesitate to  contact me.    Sincerely,      Duke Salvia, MD, [facility]  Electronically Signed    SCK/MedQ  DD: 06/17/2006  DT: 06/17/2006  Job #: [id number]   CC:   Gustavus Messing. Orlin Hilding, M.D.  Fleet Contras, M.D.

## 2010-10-03 NOTE — Assessment & Plan Note (Signed)
[facility]                         ELECTROPHYSIOLOGY OFFICE NOTE   NAME:HERBINDenette, [name]                       MRN:          [id number]  DATE:04/23/2006                            DOB:          04/17/1971    DEFIBRILLATOR NOTE:  [name] was seen today in the clinic on April 23, 2006, at the patient's request.  She was concerned because she  thought her defibrillator alerts were going off and she does have the  6949 alert lead.  On interrogation of her device today, her battery  voltage is 3.2 with a charge time of 7.47 seconds.  R-wave measured 15.8  mV with a ventricular pacing threshold at 1 V at 0.2 msec and a  ventricular lead impedance of 504 ohms.  Shock impedence was 49.  There  were 4 nonsustained episodes since last interrogation which was in July  and no discharges were noted.  No alerts.  The patient was reassured and  she has an appointment for a wound check followup on the 12th with Dr.  Ladona Ridgel.      Altha Harm, LPN  Electronically Signed      Doylene Canning. Ladona Ridgel, MD  Electronically Signed   PO/MedQ  DD: 04/23/2006  DT: 04/24/2006  Job #: [id number]

## 2010-10-03 NOTE — Assessment & Plan Note (Signed)
[facility]                             PULMONARY OFFICE NOTE   NAME:[name], [name]                       MRN:          [id number]  DATE:05/28/2006                            DOB:          August 31, 1970    Ms. Lanum is a 40 year old African American female with asthmatic  bronchitis, vocal cord dysfunction, reflux disease, history of prolonged  QTc interval, status post implantable defibrillator placement in  November 2006, irritable bowel syndrome.  She now has been diagnosed  with a seizure-type activity and has been placed on a new seizure  medication for which she does not know the name.  She had syncopal  spells, and this was detected by cardiology and determined by neurology  to be seizure-type activity.  The patient maintains:  1. Actos 15 mg daily.  2. Enulose 15 mL daily.  3. Atenolol 25 mg daily.  4. Nasonex 2 sprays each nostril daily.  5. Nexium 40 mg b.i.d.  6. Brovex 5 mL b.i.d.  7. Symbicort 160/4.5 two sprays b.i.d.   She has a dry cough, but her shortness of breath is improved.   PHYSICAL EXAMINATION:  Temp 97.9, blood pressure 110/72, pulse 92,  saturation 96% on room air.  CHEST:  Showed showed distant breath sounds, no evidence of wheeze, rale  or rhonchi.  CARDIAC:  Exam showed a regular rate and rhythm without S3, normal S1,  S2.  ABDOMEN:  Soft, nontender.  EXTREMITIES:  Showed no edema or clubbing or venous disease.  SKIN:  Clear.  NEUROLOGIC:  Exam was intact.  HEENT:  Exam showed no jugular venous distention, no lymphadenopathy.  The oropharynx is clear.  NECK:  Supple.   IMPRESSION:  1. Underlying asthmatic bronchitis.  2. Sarcoidosis.  3. Vocal cord dysfunction syndrome  4. Reflux disease.  5. Chronic chest pain.  6. Prolonged QTc interval.  7. Also note is made that she has a fractured lead on her      defibrillator, which will need to be replaced.   PLAN:  1. Followup CT scanning of the chest to follow up  interval change in      nodules related to sarcoidosis.  2. Maintain Symbicort, Brovex as is.  3. Maintain Nexium as is.  4. We will see the patient back in followup in 2 months.     Charlcie Cradle Delford Field, MD, [facility]  Electronically Signed    PEW/MedQ  DD: 05/29/2006  DT: 05/30/2006  Job #: [id number]   cc:   Doylene Canning. Ladona Ridgel, MD

## 2010-10-03 NOTE — Assessment & Plan Note (Signed)
[facility]                         ELECTROPHYSIOLOGY OFFICE NOTE   NAME:HERBINVenise, Ellingwood                       MRN:          [id number]  DATE:08/19/2006                            DOB:          [birth year]    Ms. Sizemore was seen today in the clinic on the 3rd of April, 2008, for  follow up of her Medtronic Model [phone] Maximo, date of implant was  March 20, 2005, for long QT.  On interrogation of her device today her  battery voltage is 3.19 with a charge time of 7.6 seconds.  R waves  measured 9.4 millivolts with a ventricular capture threshold of 1 volt  at 0.1 millisecond and a ventricular lead impedance of 480 ohms.  Shock  impedance was 47.  There were no episodes since last interrogation, no  changes were made in her parameters.  She will continue with her  [facility] transmission with a return office visit in November of this  year with Dr. Ladona Ridgel.  Also, Ms. Nagorski was seen in [place] and she does  have a [phone] Alert lead in which she informed me today is going to be  explanted at [facility].      Altha Harm, LPN  Electronically Signed      Doylene Canning. Ladona Ridgel, MD  Electronically Signed   PO/MedQ  DD: 08/19/2006  DT: 08/19/2006  Job #: [id number]

## 2010-10-03 NOTE — Op Note (Signed)
[name], [name]              ACCOUNT NO.:  [id number]   MEDICAL RECORD NO.:  [id number]          PATIENT TYPE:  AMB   LOCATION:  SDS                          FACILITY:  [facility]   PHYSICIAN:  Artist Pais. 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   PROCEDURE:  Right fourth and fifth extensor tenolysis.   SURGEON:  Artist Pais. Mina Marble, M.D.   ASSISTANT:  None.   ANESTHESIA:  General.   TOURNIQUET TIME:  25 minutes.   No complications or drains.   OPERATIVE REPORT:  The patient was taken to the operating room.  After the  induction of adequate general anesthesia, the right upper extremity was  prepped and draped in the usual sterile fashion.  An Esmarch was used to  exsanguinate the limb.  Tourniquet was inflated to 250 mmHg.  At this point  in time, using an old incision centered between the intermetacarpal space  between the fourth and fifth metacarpal head and neck junction, the skin was  incised.  It was drawn distally for an additional 2 cm.  Flap was raised.  Neurovascular bundles were identified and retracted, including branches of  the superficial ulnar nerve and dorsal veins.  The extensor tendons  overlying the fourth and fifth metacarpophalangeal joint areas were  significantly scarred and fibrosed.  Gentle manipulation was undertaken at  the metacarpophalangeal joint level of the fourth and fifth digits, followed  by extensive tenolysis using tenotomy scissors and a Therapist, nutritional.  After  this was done, hemostasis was achieved with bipolar cautery.  The wound was  then irrigated and loosely closed with a 3-0 Prolene subcuticular stitch.  Steri-Strips, 4x4 fluffs, and a compressive dressing  were applied.  The  patient tolerated the procedure well and went to the recovery room in stable  fashion.      Artist Pais Mina Marble, M.D.  Electronically Signed     MAW/MEDQ  D:  06/24/2005  T:  06/24/2005  Job:  [id number]

## 2010-11-27 ENCOUNTER — Other Ambulatory Visit: Payer: Self-pay | Admitting: *Deleted

## 2011-02-16 LAB — GLUCOSE, CAPILLARY: Glucose-Capillary: 167 — ABNORMAL HIGH

## 2011-05-29 ENCOUNTER — Other Ambulatory Visit: Payer: Self-pay | Admitting: Critical Care Medicine

## 2011-07-01 ENCOUNTER — Telehealth: Payer: Self-pay | Admitting: Internal Medicine

## 2011-07-01 NOTE — Telephone Encounter (Signed)
07-01-11 PT'S H# DC, CALLED MOTHER CONSTANCE WHARTON, WILL HAVE PT CALL TO SET UP APPT WITH TAYLOR IN April/MT

## 2011-08-03 ENCOUNTER — Other Ambulatory Visit (HOSPITAL_COMMUNITY): Payer: Self-pay | Admitting: Obstetrics

## 2011-08-03 DIAGNOSIS — Z1231 Encounter for screening mammogram for malignant neoplasm of breast: Secondary | ICD-10-CM

## 2011-09-03 ENCOUNTER — Telehealth: Payer: Self-pay | Admitting: Critical Care Medicine

## 2011-09-03 ENCOUNTER — Other Ambulatory Visit: Payer: Self-pay | Admitting: Internal Medicine

## 2011-09-03 ENCOUNTER — Ambulatory Visit (INDEPENDENT_AMBULATORY_CARE_PROVIDER_SITE_OTHER): Payer: Medicaid Other | Admitting: Critical Care Medicine

## 2011-09-03 ENCOUNTER — Encounter: Payer: Self-pay | Admitting: Critical Care Medicine

## 2011-09-03 ENCOUNTER — Ambulatory Visit (INDEPENDENT_AMBULATORY_CARE_PROVIDER_SITE_OTHER)
Admission: RE | Admit: 2011-09-03 | Discharge: 2011-09-03 | Disposition: A | Discharge status: Home / Self Care | Payer: Medicaid Other | Source: Ambulatory Visit | Attending: Critical Care Medicine | Admitting: Critical Care Medicine

## 2011-09-03 VITALS — BP 128/72 | HR 100 | Temp 98.5°F | Ht 67.0 in | Wt 202.2 lb

## 2011-09-03 DIAGNOSIS — R05 Cough: Secondary | ICD-10-CM

## 2011-09-03 MED ORDER — ATENOLOL 50 MG PO TABS: 50.0000 mg | ORAL_TABLET | Freq: Two times a day (BID) | ORAL | Status: DC

## 2011-09-03 MED ORDER — TRAMADOL HCL 50 MG PO TABS: ORAL_TABLET | ORAL | Status: DC

## 2011-09-03 MED ORDER — CHLORPHENIRAMINE MALEATE 12 MG PO CP24: ORAL_CAPSULE | ORAL | Status: DC

## 2011-09-03 MED ORDER — OMEPRAZOLE 20 MG PO CPDR: 20.0000 mg | DELAYED_RELEASE_CAPSULE | Freq: Two times a day (BID) | ORAL | Status: DC

## 2011-09-03 MED ORDER — BENZONATATE 200 MG PO CAPS: 200.0000 mg | ORAL_CAPSULE | Freq: Three times a day (TID) | ORAL | Status: AC | PRN

## 2011-09-03 MED ORDER — PREDNISONE 10 MG PO TABS: ORAL_TABLET | ORAL | Status: DC

## 2011-09-03 NOTE — Assessment & Plan Note (Signed)
Cyclical cough without evidence of airway inflammation or obstruction in patient with prior history of sarcoidosis. There is no evidence of active airway obstruction at this time. There is allergic rhinitis with postnasal drip syndrome. There is significant reflux component to the upper airway instability Plan Chlorpheniramine 12 mg at bedtime Prednisone in a brief taper Cyclic cough protocol with tramadol and Tessalon pearls Double Prilosec to twice daily and follow strict reflux diet Obtain chest x-ray to rule out evidence for recurrent sarcoidosis

## 2011-09-03 NOTE — Patient Instructions (Signed)
Chest xray today Chlorpheniramine 12mg  at bedtime Increase omeprazole to twice daily Prednisone pulse Cyclic cough protocol with tramadol/tessalon Reflux diet Return 2 weeks for recheck with tammy parrett

## 2011-09-03 NOTE — Progress Notes (Signed)
Subjective:    Patient ID: [name], female    DOB: May 29, 1970, 41 y.o.   MRN: [id number]  HPI This is a 41 y.o. , African-American female with history of sarcoidosis and associated asthmatic bronchitis. The patient also had chronic chest pain has been thoroughly evaluated by cardiology. She does have a history of long QT syndrome and has an ICD placed in 2006.   09/03/2011 Cough for 4 days. No help with cough drops.  Notes some dyspnea.  Hx of AB and sarcoid Notes some wheeze, and dyspnea.  Cough is dry , occ productive.  When productive is clear Nose is draining green mucus and clear.  Notes some pndrip.  Notes some heartburn.  Will break through on prilosec that is taken daily.   Past Medical History  Diagnosis Date  . Long Q-T syndrome   . Palpitation   . Seizure disorder   . Abdominal pain, periumbilic   . Esophageal stricture   . Sarcoidosis   . HTN (hypertension)   . DM (diabetes mellitus)   . Allergic rhinitis   . Bronchitis   . Sarcoidosis   . GERD (gastroesophageal reflux disease)   . Asthma   . Fibromyalgia      Family History  Problem Relation Age of Onset  . Cancer    . Diabetes       History   Social History  . Marital Status: Single    Spouse Name: N/A    Number of Children: N/A  . Years of Education: N/A   Occupational History  . Not on file.   Social History Main Topics  . Smoking status: Never Smoker   . Smokeless tobacco: Not on file  . Alcohol Use: Yes  . Drug Use: No  . Sexually Active: Not on file   Other Topics Concern  . Not on file   Social History Narrative  . No narrative on file     Allergies  Allergen Reactions  . Codeine   . Iohexol      Code: RASH, Desc: PT STATES SHE IS ALLERGIC TO IV CONTRAST 05/28/06/RM, Onset Date: 40981191   . Meperidine Hcl      Outpatient Prescriptions Prior to Visit  Medication Sig Dispense Refill  . atenolol (TENORMIN) 50 MG tablet Take 50 mg by mouth 2 (two) times daily.        .  budesonide-formoterol (SYMBICORT) 160-4.5 MCG/ACT inhaler Inhale 2 puffs into the lungs 2 (two) times daily.        . cyclobenzaprine (FLEXERIL) 10 MG tablet Take 10 mg by mouth as needed.       . cycloSPORINE (RESTASIS) 0.05 % ophthalmic emulsion 1 drop 2 (two) times daily.        Marland Kitchen gabapentin (NEURONTIN) 100 MG capsule Take 100 mg by mouth as directed.        Marland Kitchen glimepiride (AMARYL) 2 MG tablet Take 2 mg by mouth daily before breakfast.        . insulin glargine (LANTUS) 100 UNIT/ML injection Inject into the skin as directed.        . pioglitazone (ACTOS) 15 MG tablet Take 15 mg by mouth daily.        Marland Kitchen lubiprostone (AMITIZA) 24 MCG capsule Take 24 mcg by mouth 2 (two) times daily with a meal.        . olopatadine (PATANOL) 0.1 % ophthalmic solution 1 drop 2 (two) times daily.        Marland Kitchen omeprazole (PRILOSEC)  20 MG capsule Take 20 mg by mouth daily.        . TraMADol HCl 50 MG TBSO Take by mouth as needed.        . cetirizine (ZYRTEC) 10 MG tablet Take 10 mg by mouth daily.             Review of Systems 11  point review of system is obtained and is negative except as noted above in history of present illness    Objective:   Physical Exam Filed Vitals:   09/03/11 1431  BP: 128/72  Pulse: 100  Temp: 98.5 F (36.9 C)  TempSrc: Oral  Height: 5\' 7"  (1.702 m)  Weight: 202 lb 3.2 oz (91.717 kg)  SpO2: 97%    Gen: Pleasant, well-nourished, in no distress,  normal affect  ENT: No lesions,  mouth clear,  oropharynx clear, no postnasal drip  Neck: No JVD, no TMG, no carotid bruits  Lungs: No use of accessory muscles, no dullness to percussion, prominent severe wheeze  Cardiovascular: RRR, heart sounds normal, no murmur or gallops, no peripheral edema  Abdomen: soft and NT, no HSM,  BS normal  Musculoskeletal: No deformities, no cyanosis or clubbing  Neuro: alert, non focal  Skin: Warm, no lesions or rashes        Assessment & Plan:   Cough Cyclical cough without  evidence of airway inflammation or obstruction in patient with prior history of sarcoidosis. There is no evidence of active airway obstruction at this time. There is allergic rhinitis with postnasal drip syndrome. There is significant reflux component to the upper airway instability Plan Chlorpheniramine 12 mg at bedtime Prednisone in a brief taper Cyclic cough protocol with tramadol and Tessalon pearls Double Prilosec to twice daily and follow strict reflux diet Obtain chest x-ray to rule out evidence for recurrent sarcoidosis    Updated Medication List Outpatient Encounter Prescriptions as of 09/03/2011  Medication Sig Dispense Refill  . atenolol (TENORMIN) 50 MG tablet Take 50 mg by mouth 2 (two) times daily.        . budesonide-formoterol (SYMBICORT) 160-4.5 MCG/ACT inhaler Inhale 2 puffs into the lungs 2 (two) times daily.        . cyclobenzaprine (FLEXERIL) 10 MG tablet Take 10 mg by mouth as needed.       . cycloSPORINE (RESTASIS) 0.05 % ophthalmic emulsion 1 drop 2 (two) times daily.        Marland Kitchen gabapentin (NEURONTIN) 100 MG capsule Take 100 mg by mouth as directed.        Marland Kitchen glimepiride (AMARYL) 2 MG tablet Take 2 mg by mouth daily before breakfast.        . insulin glargine (LANTUS) 100 UNIT/ML injection Inject into the skin as directed.        . Olopatadine HCl (PATADAY) 0.2 % SOLN Apply 1 drop to eye 2 (two) times daily.      Marland Kitchen omeprazole (PRILOSEC) 20 MG capsule Take 1 capsule (20 mg total) by mouth 2 (two) times daily.  60 capsule  6  . pioglitazone (ACTOS) 15 MG tablet Take 15 mg by mouth daily.        Marland Kitchen DISCONTD: lubiprostone (AMITIZA) 24 MCG capsule Take 24 mcg by mouth 2 (two) times daily with a meal.        . DISCONTD: olopatadine (PATANOL) 0.1 % ophthalmic solution 1 drop 2 (two) times daily.        Marland Kitchen DISCONTD: omeprazole (PRILOSEC) 20 MG capsule Take 20 mg  by mouth daily.        Marland Kitchen DISCONTD: TraMADol HCl 50 MG TBSO Take by mouth as needed.        . benzonatate (TESSALON) 200  MG capsule Take 1 capsule (200 mg total) by mouth 3 (three) times daily as needed for cough.  30 capsule  1  . cetirizine (ZYRTEC) 10 MG tablet Take 10 mg by mouth daily.        . Chlorpheniramine Maleate 12 MG CP24 Use at bedtime daily  30 each  1  . predniSONE (DELTASONE) 10 MG tablet Take 4 for two days three for two days two for two days one for two days  20 tablet  0  . traMADol (ULTRAM) 50 MG tablet Take per cyclic cough protocol  1-2 every 4-6 hours as needed  30 tablet  0

## 2011-09-03 NOTE — Telephone Encounter (Signed)
Called, spoke with pt who c/o prod cough with white mucus, increased SOB, slight wheezing, and chest soreness.  Using Delsym and cough drops with no relief.  Last seen by Dr. Delford Field 02/2010.  Pt scheduled for OV today at 2:30 with PW -- she is aware.

## 2011-09-03 NOTE — Progress Notes (Signed)
Quick Note:  Notify the patient that the Xray is stable and no pneumonia No change in medications are recommended. Continue current meds as prescribed at last office visit ______ 

## 2011-09-04 ENCOUNTER — Telehealth: Payer: Self-pay | Admitting: Critical Care Medicine

## 2011-09-04 NOTE — Progress Notes (Signed)
Quick Note:  Per phone msg from 09/04/2011, pt has been notified of these results and recs. ______

## 2011-09-04 NOTE — Telephone Encounter (Signed)
Notes Recorded by Storm Frisk, MD on 09/03/2011 at 5:04 PM Notify the patient that the Xray is stable and no pneumonia No change in medications are recommended. Continue current meds as prescribed at last office visit LMTCB to inform the pt of the above

## 2011-09-04 NOTE — Progress Notes (Signed)
Quick Note:  Called pt - lmontcb ______

## 2011-09-04 NOTE — Telephone Encounter (Signed)
Spoke with the pt and notified of cxr results. She verbalized understanding and states that nothing further needed.

## 2011-09-09 ENCOUNTER — Ambulatory Visit: Payer: Medicaid Other | Admitting: Adult Health

## 2011-09-21 ENCOUNTER — Encounter: Payer: Self-pay | Admitting: Adult Health

## 2011-09-21 ENCOUNTER — Ambulatory Visit (INDEPENDENT_AMBULATORY_CARE_PROVIDER_SITE_OTHER): Payer: Medicaid Other | Admitting: Adult Health

## 2011-09-21 VITALS — BP 132/74 | HR 81 | Temp 98.5°F | Ht 67.0 in | Wt 201.0 lb

## 2011-09-21 DIAGNOSIS — D869 Sarcoidosis, unspecified: Secondary | ICD-10-CM

## 2011-09-21 MED ORDER — AMOXICILLIN-POT CLAVULANATE 875-125 MG PO TABS: 1.0000 | ORAL_TABLET | Freq: Two times a day (BID) | ORAL | Status: AC

## 2011-09-21 MED ORDER — PREDNISONE 10 MG PO TABS: ORAL_TABLET | ORAL | Status: DC

## 2011-09-21 NOTE — Assessment & Plan Note (Signed)
Flare with associated bronchitis   Plan:  Delsym 2 tsp Twice daily  For cough .  Cyclic cough protocol with tramadol/tessalon Chlorphenaramine 4mg  2 at bedtime.  Prednisone taper as directed.  Augmentin 875mg  Twice daily  For 7 days -take with food .  Continue on Reflux diet Return in 4 weeks with Dr. Delford Field   Please contact office for sooner follow up if symptoms do not improve or worsen or seek emergency care

## 2011-09-21 NOTE — Progress Notes (Signed)
Subjective:    Patient ID: [name], female    DOB: 28-Mar-1971, 41 y.o.   MRN: [id number]  HPI  This is a 41 y.o. , African-American female with history of sarcoidosis and associated asthmatic bronchitis. The patient also had chronic chest pain has been thoroughly evaluated by cardiology. She does have a history of long QT syndrome and has an ICD placed in 2006.   09/03/2011 Cough for 4 days. No help with cough drops.  Notes some dyspnea.  Hx of AB and sarcoid Notes some wheeze, and dyspnea.  Cough is dry , occ productive.  When productive is clear Nose is draining green mucus and clear.  Notes some pndrip.  Notes some heartburn.  Will break through on prilosec that is taken daily. >cyclical cough protocol , CXR neg and steroid taper   09/21/2011 Follow up  2 week follow up cough. Reports is improved but symptoms returned 2 days ago with prod cough with clear mucus, head congestion with gree/clear mucus, sore throat, wheezing, increased SOB Last ov tx w/ steroid taper , cyclical cough protocol and CXR was neg.  No fever or edema.  No rash.     Past Medical History  Diagnosis Date  . Long Q-T syndrome   . Palpitation   . Seizure disorder   . Abdominal pain, periumbilic   . Esophageal stricture   . Sarcoidosis   . HTN (hypertension)   . DM (diabetes mellitus)   . Allergic rhinitis   . Bronchitis   . Sarcoidosis   . GERD (gastroesophageal reflux disease)   . Asthma   . Fibromyalgia      Family History  Problem Relation Age of Onset  . Cancer    . Diabetes       History   Social History  . Marital Status: Single    Spouse Name: N/A    Number of Children: N/A  . Years of Education: N/A   Occupational History  . Not on file.   Social History Main Topics  . Smoking status: Never Smoker   . Smokeless tobacco: Not on file  . Alcohol Use: Yes  . Drug Use: No  . Sexually Active: Not on file   Other Topics Concern  . Not on file   Social History Narrative   . No narrative on file     Allergies  Allergen Reactions  . Codeine   . Iohexol      Code: RASH, Desc: PT STATES SHE IS ALLERGIC TO IV CONTRAST 05/28/06/RM, Onset Date: 40981191   . Meperidine Hcl      Outpatient Prescriptions Prior to Visit  Medication Sig Dispense Refill  . atenolol (TENORMIN) 50 MG tablet Take 1 tablet (50 mg total) by mouth 2 (two) times daily.  60 tablet  2  . budesonide-formoterol (SYMBICORT) 160-4.5 MCG/ACT inhaler Inhale 2 puffs into the lungs 2 (two) times daily.        . cetirizine (ZYRTEC) 10 MG tablet Take 10 mg by mouth daily.        . Chlorpheniramine Maleate 12 MG CP24 Use at bedtime daily  30 each  1  . cyclobenzaprine (FLEXERIL) 10 MG tablet Take 10 mg by mouth as needed.       . cycloSPORINE (RESTASIS) 0.05 % ophthalmic emulsion Place 1 drop into both eyes 2 (two) times daily.       Marland Kitchen gabapentin (NEURONTIN) 100 MG capsule Take 100 mg by mouth as directed.        Marland Kitchen  glimepiride (AMARYL) 2 MG tablet Take 2 mg by mouth daily before breakfast.        . insulin glargine (LANTUS) 100 UNIT/ML injection Inject 10 Units into the skin 2 (two) times daily.       . Olopatadine HCl (PATADAY) 0.2 % SOLN Place 1 drop into both eyes 2 (two) times daily.       Marland Kitchen omeprazole (PRILOSEC) 20 MG capsule Take 1 capsule (20 mg total) by mouth 2 (two) times daily.  60 capsule  6  . pioglitazone (ACTOS) 15 MG tablet Take 15 mg by mouth daily.        . predniSONE (DELTASONE) 10 MG tablet Take 4 for two days three for two days two for two days one for two days  20 tablet  0       Review of Systems  11  point review of system is obtained and is negative except as noted above in history of present illness    Objective:   Physical Exam  Filed Vitals:   09/21/11 1059  BP: 132/74  Pulse: 81  Temp: 98.5 F (36.9 C)  TempSrc: Oral  Height: 5\' 7"  (1.702 m)  Weight: 201 lb (91.173 kg)  SpO2: 95%    Gen: Pleasant, well-nourished, in no distress,  normal affect  ENT:  No lesions,  mouth clear,  oropharynx clear, no postnasal drip  Neck: No JVD, no TMG, no carotid bruits  Lungs: No use of accessory muscles, no dullness to percussion, no wheezing   Cardiovascular: RRR, heart sounds normal, no murmur or gallops, no peripheral edema  Abdomen: soft and NT, no HSM,  BS normal  Musculoskeletal: No deformities, no cyanosis or clubbing  Neuro: alert, non focal  Skin: Warm, no lesions or rashes        Assessment & Plan:   No problem-specific assessment & plan notes found for this encounter.   Updated Medication List Outpatient Encounter Prescriptions as of 09/21/2011  Medication Sig Dispense Refill  . atenolol (TENORMIN) 50 MG tablet Take 1 tablet (50 mg total) by mouth 2 (two) times daily.  60 tablet  2  . budesonide-formoterol (SYMBICORT) 160-4.5 MCG/ACT inhaler Inhale 2 puffs into the lungs 2 (two) times daily.        . cetirizine (ZYRTEC) 10 MG tablet Take 10 mg by mouth daily.        . Chlorpheniramine Maleate 12 MG CP24 Use at bedtime daily  30 each  1  . cyclobenzaprine (FLEXERIL) 10 MG tablet Take 10 mg by mouth as needed.       . cycloSPORINE (RESTASIS) 0.05 % ophthalmic emulsion Place 1 drop into both eyes 2 (two) times daily.       Marland Kitchen gabapentin (NEURONTIN) 100 MG capsule Take 100 mg by mouth as directed.        Marland Kitchen glimepiride (AMARYL) 2 MG tablet Take 2 mg by mouth daily before breakfast.        . insulin glargine (LANTUS) 100 UNIT/ML injection Inject 10 Units into the skin 2 (two) times daily.       . Olopatadine HCl (PATADAY) 0.2 % SOLN Place 1 drop into both eyes 2 (two) times daily.       Marland Kitchen omeprazole (PRILOSEC) 20 MG capsule Take 1 capsule (20 mg total) by mouth 2 (two) times daily.  60 capsule  6  . pioglitazone (ACTOS) 15 MG tablet Take 15 mg by mouth daily.        . predniSONE (DELTASONE)  10 MG tablet Take 4 for two days three for two days two for two days one for two days  20 tablet  0

## 2011-09-21 NOTE — Patient Instructions (Addendum)
Delsym 2 tsp Twice daily  For cough .  Cyclic cough protocol with tramadol/tessalon Chlorphenaramine 4mg  2 at bedtime.  Prednisone taper as directed.  Augmentin 875mg  Twice daily  For 7 days -take with food .  Continue on Reflux diet Return in 4 weeks with Dr. Delford Field   Please contact office for sooner follow up if symptoms do not improve or worsen or seek emergency care

## 2011-10-02 ENCOUNTER — Ambulatory Visit: Payer: Medicaid Other | Admitting: Gastroenterology

## 2011-10-07 ENCOUNTER — Ambulatory Visit (INDEPENDENT_AMBULATORY_CARE_PROVIDER_SITE_OTHER): Payer: Medicaid Other | Admitting: Internal Medicine

## 2011-10-07 ENCOUNTER — Encounter: Payer: Self-pay | Admitting: Internal Medicine

## 2011-10-07 VITALS — BP 142/88 | HR 72 | Ht 67.0 in | Wt 200.0 lb

## 2011-10-07 DIAGNOSIS — Q898 Other specified congenital malformations: Secondary | ICD-10-CM

## 2011-10-07 DIAGNOSIS — Z95810 Presence of automatic (implantable) cardiac defibrillator: Secondary | ICD-10-CM

## 2011-10-07 DIAGNOSIS — I4581 Long QT syndrome: Secondary | ICD-10-CM

## 2011-10-07 LAB — ICD DEVICE OBSERVATION
DEV-0020ICD: NEGATIVE
PACEART VT: 2
RV LEAD THRESHOLD: 1.5 V
TZAT-0001SLOWVT: 2
TZAT-0001SLOWVT: 3
TZAT-0002SLOWVT: NEGATIVE
TZAT-0002SLOWVT: NEGATIVE
TZAT-0002SLOWVT: NEGATIVE
TZAT-0002SLOWVT: NEGATIVE
TZAT-0012FASTVT: 200 ms
TZAT-0012SLOWVT: 200 ms
TZAT-0012SLOWVT: 200 ms
TZAT-0012SLOWVT: 200 ms
TZAT-0013FASTVT: 1
TZAT-0018SLOWVT: NEGATIVE
TZAT-0018SLOWVT: NEGATIVE
TZAT-0018SLOWVT: NEGATIVE
TZAT-0019FASTVT: 8 V
TZAT-0019SLOWVT: 8 V
TZAT-0019SLOWVT: 8 V
TZAT-0019SLOWVT: 8 V
TZAT-0019SLOWVT: 8 V
TZAT-0020FASTVT: 1.6 ms
TZAT-0020SLOWVT: 1.6 ms
TZAT-0020SLOWVT: 1.6 ms
TZAT-0020SLOWVT: 1.6 ms
TZON-0003FASTVT: 250 ms
TZON-0005SLOWVT: 12
TZON-0008FASTVT: 0 ms
TZON-0008SLOWVT: 0 ms
TZON-0011AFLUTTER: 70
TZST-0001FASTVT: 4
TZST-0003FASTVT: 25 J
TZST-0003FASTVT: 35 J
VENTRICULAR PACING ICD: 0 pct

## 2011-10-07 NOTE — Assessment & Plan Note (Signed)
Her device is working normally. She has had no recent ICD therapies.

## 2011-10-07 NOTE — Patient Instructions (Signed)
Your physician recommends that you schedule a follow-up appointment in: 3 months with the device clinic and 12 months with Dr Taylor  

## 2011-10-07 NOTE — Assessment & Plan Note (Signed)
She has had no recurrent ventricular arrhythmias. No change in medical therapy at this point. She will continue her beta blocker.

## 2011-10-07 NOTE — Progress Notes (Signed)
HPI Mrs. Her returns today for followup. She is a very pleasant 41 year old woman with long QT syndrome, a strong family history of sudden cardiac death, status post ICD implantation. She also has SVT and underwent catheter ablation. She admits to being quite sedentary. No chest pain or shortness of breath. She is gaining weight. No syncope or recent ICD shock.   Allergies  Allergen Reactions  . Codeine   . Iohexol      Code: RASH, Desc: PT STATES SHE IS ALLERGIC TO IV CONTRAST 05/28/06/RM, Onset Date: 40981191   . Meperidine Hcl      Current Outpatient Prescriptions  Medication Sig Dispense Refill  . atenolol (TENORMIN) 50 MG tablet Take 1 tablet (50 mg total) by mouth 2 (two) times daily.  60 tablet  2  . budesonide-formoterol (SYMBICORT) 160-4.5 MCG/ACT inhaler Inhale 2 puffs into the lungs 2 (two) times daily.        . cetirizine (ZYRTEC) 10 MG tablet Take 10 mg by mouth daily.        . Chlorpheniramine Maleate 12 MG CP24 Use at bedtime daily  30 each  1  . cyclobenzaprine (FLEXERIL) 10 MG tablet Take 10 mg by mouth as needed.       . cycloSPORINE (RESTASIS) 0.05 % ophthalmic emulsion Place 1 drop into both eyes 2 (two) times daily.       Marland Kitchen gabapentin (NEURONTIN) 100 MG capsule Take 100 mg by mouth as directed.        Marland Kitchen glimepiride (AMARYL) 2 MG tablet Take 2 mg by mouth daily before breakfast.        . insulin glargine (LANTUS) 100 UNIT/ML injection Inject 20 Units into the skin 2 (two) times daily.       . Olopatadine HCl (PATADAY) 0.2 % SOLN Place 1 drop into both eyes 2 (two) times daily.       Marland Kitchen omeprazole (PRILOSEC) 20 MG capsule Take 1 capsule (20 mg total) by mouth 2 (two) times daily.  60 capsule  6  . pioglitazone (ACTOS) 15 MG tablet Take 15 mg by mouth daily.        . predniSONE (DELTASONE) 10 MG tablet Take 4 for two days three for two days two for two days one for two days  20 tablet  0     Past Medical History  Diagnosis Date  . Long Q-T syndrome   . Palpitation     . Seizure disorder   . Abdominal pain, periumbilic   . Esophageal stricture   . Sarcoidosis   . HTN (hypertension)   . DM (diabetes mellitus)   . Allergic rhinitis   . Bronchitis   . Sarcoidosis   . GERD (gastroesophageal reflux disease)   . Asthma   . Fibromyalgia     ROS:   All systems reviewed and negative except as noted in the HPI.   Past Surgical History  Procedure Date  . Tubal ligation   . Hand surgery   . Svt ablation   . Automatic implantable cardiac defibrillator situ     ICD-[employer]   Remote - Yes      Family History  Problem Relation Age of Onset  . Cancer    . Diabetes       History   Social History  . Marital Status: Single    Spouse Name: N/A    Number of Children: N/A  . Years of Education: N/A   Occupational History  . Not on file.  Social History Main Topics  . Smoking status: Never Smoker   . Smokeless tobacco: Not on file  . Alcohol Use: Yes  . Drug Use: No  . Sexually Active: Not on file   Other Topics Concern  . Not on file   Social History Narrative  . No narrative on file     BP 142/88  Pulse 72  Ht 5\' 7"  (1.702 m)  Wt 200 lb (90.719 kg)  BMI 31.32 kg/m2  Physical Exam:  Well appearing YOUNG WOMAN, NAD HEENT: Unremarkable Neck:  No JVD, no thyromegally Lungs:  Clear with no wheezes, rales, or rhonchi. Well-healed ICD incision.  HEART:  Regular rate rhythm, no murmurs, no rubs, no clicks Abd:  soft, positive bowel sounds, no organomegally, no rebound, no guarding Ext:  2 plus pulses, no edema, no cyanosis, no clubbing Skin:  No rashes no nodules Neuro:  CN II through XII intact, motor grossly intact  DEVICE  Normal device function.  See PaceArt for details.   Assess/Plan:

## 2011-10-15 ENCOUNTER — Ambulatory Visit (HOSPITAL_COMMUNITY)
Admission: RE | Admit: 2011-10-15 | Discharge: 2011-10-15 | Disposition: A | Discharge status: Home / Self Care | Payer: Medicaid Other | Source: Ambulatory Visit | Attending: Obstetrics | Admitting: Obstetrics

## 2011-10-15 DIAGNOSIS — Z1231 Encounter for screening mammogram for malignant neoplasm of breast: Secondary | ICD-10-CM | POA: Insufficient documentation

## 2011-11-10 ENCOUNTER — Ambulatory Visit (INDEPENDENT_AMBULATORY_CARE_PROVIDER_SITE_OTHER): Payer: Medicaid Other | Admitting: Gastroenterology

## 2011-11-10 ENCOUNTER — Encounter: Payer: Self-pay | Admitting: Gastroenterology

## 2011-11-10 VITALS — BP 112/74 | HR 94 | Ht 67.0 in | Wt 198.6 lb

## 2011-11-10 DIAGNOSIS — K5900 Constipation, unspecified: Secondary | ICD-10-CM

## 2011-11-10 DIAGNOSIS — K594 Anal spasm: Secondary | ICD-10-CM

## 2011-11-10 MED ORDER — GLYCOPYRROLATE 2 MG PO TABS: 2.0000 mg | ORAL_TABLET | Freq: Two times a day (BID) | ORAL | Status: DC

## 2011-11-10 NOTE — Patient Instructions (Addendum)
We have given you Linzess 290mg  samples today Call back in 1 week to report your progress Follow up with Dr Arlyce Dice in 4-6 weeks

## 2011-11-10 NOTE — Progress Notes (Signed)
History of Present Illness:  [name] returns today for evaluation of rectal pain. She suffers from chronic constipation and may go several days without a bowel movement.  She's been complaining of shooting pain in her rectum. Pain lasts seconds at a time but is moderately severe and may be recurrent throughout the day. She underwent band ligation of hemorrhoids in November, 2011. Pain is not like her hemorrhoidal pain. She's had no bleeding.    Review of Systems: Pertinent positive and negative review of systems were noted in the above HPI section. All other review of systems were otherwise negative.    Current Medications, Allergies, Past Medical History, Past Surgical History, Family History and Social History were reviewed in [employer] electronic medical record  Vital signs were reviewed in today's medical record. Physical Exam: General: Well developed , well nourished, no acute distress Head: Normocephalic and atraumatic Eyes:  sclerae anicteric, EOMI Ears: Normal auditory acuity Mouth: No deformity or lesions Lungs: Clear throughout to auscultation Heart: Regular rate and rhythm; no murmurs, rubs or bruits Abdomen: Soft, non tender and non distended. No masses, hepatosplenomegaly or hernias noted. Normal Bowel sounds Rectal: There are a few external hemorrhoids Musculoskeletal: Symmetrical with no gross deformities  Pulses:  Normal pulses noted Extremities: No clubbing, cyanosis, edema or deformities noted Neurological: Alert oriented x 4, grossly nonfocal Psychological:  Alert and cooperative. Normal mood and affect

## 2011-11-10 NOTE — Assessment & Plan Note (Signed)
Plan trial of Linzess 290ug qd

## 2011-11-10 NOTE — Assessment & Plan Note (Signed)
Rectal pain is likely due 2 spasm.  Constipation may be a contributing factor.  Recommendations #1 warm soaks #2 Robinul forte when necessary #3Linzess

## 2011-11-17 ENCOUNTER — Encounter: Payer: Self-pay | Admitting: Critical Care Medicine

## 2011-11-17 ENCOUNTER — Ambulatory Visit (INDEPENDENT_AMBULATORY_CARE_PROVIDER_SITE_OTHER): Payer: Medicaid Other | Admitting: Critical Care Medicine

## 2011-11-17 VITALS — BP 122/96 | HR 95 | Temp 98.6°F | Ht 67.0 in | Wt 196.0 lb

## 2011-11-17 DIAGNOSIS — D869 Sarcoidosis, unspecified: Secondary | ICD-10-CM

## 2011-11-17 MED ORDER — TRAMADOL HCL 50 MG PO TABS: ORAL_TABLET | ORAL | Status: AC

## 2011-11-17 MED ORDER — FLUTICASONE PROPIONATE 50 MCG/ACT NA SUSP: 2.0000 | Freq: Every day | NASAL | Status: DC

## 2011-11-17 NOTE — Progress Notes (Signed)
Subjective:    Patient ID: [name], female    DOB: Aug 27, 1970, 41 y.o.   MRN: [id number]  HPI  This is a 41 y.o. , African-American female with history of sarcoidosis and associated asthmatic bronchitis. The patient also had chronic chest pain has been thoroughly evaluated by cardiology. She does have a history of long QT syndrome and has an ICD placed in 2006.   09/21/11 Follow up  2 week follow up cough. Reports is improved but symptoms returned 2 days ago with prod cough with clear mucus, head congestion with gree/clear mucus, sore throat, wheezing, increased SOB Last ov tx w/ steroid taper , cyclical cough protocol and CXR was neg.  No fever or edema.  No rash.   11/17/2011 Now coughing more for two days.  Cough is prod of white mucus .  No wheeze.  Pt denies dyspnea changes.  Tessalon helps with cough.  No real heartburn. Pt denies any significant sore throat, nasal congestion or excess secretions, fever, chills, sweats, unintended weight loss, pleurtic or exertional chest pain, orthopnea PND, or leg swelling Pt denies any increase in rescue therapy over baseline, denies waking up needing it or having any early am or nocturnal exacerbations of coughing/wheezing/or dyspnea. Pt also denies any obvious fluctuation in symptoms with  weather or environmental change or other alleviating or aggravating factors     Past Medical History  Diagnosis Date  . Long Q-T syndrome   . Palpitation   . Seizure disorder   . Abdominal pain, periumbilic   . Esophageal stricture   . Sarcoidosis   . HTN (hypertension)   . DM (diabetes mellitus)   . Allergic rhinitis   . Bronchitis   . Sarcoidosis   . GERD (gastroesophageal reflux disease)   . Asthma   . Fibromyalgia      Family History  Problem Relation Age of Onset  . Cancer    . Diabetes       History   Social History  . Marital Status: Single    Spouse Name: N/A    Number of Children: N/A  . Years of Education: N/A    Occupational History  . Not on file.   Social History Main Topics  . Smoking status: Never Smoker   . Smokeless tobacco: Never Used  . Alcohol Use: Yes  . Drug Use: No  . Sexually Active: Not on file   Other Topics Concern  . Not on file   Social History Narrative  . No narrative on file     Allergies  Allergen Reactions  . Codeine   . Iohexol      Code: RASH, Desc: PT STATES SHE IS ALLERGIC TO IV CONTRAST 05/28/06/RM, Onset Date: 78295621   . Meperidine Hcl      Outpatient Prescriptions Prior to Visit  Medication Sig Dispense Refill  . atenolol (TENORMIN) 50 MG tablet Take 1 tablet (50 mg total) by mouth 2 (two) times daily.  60 tablet  2  . budesonide-formoterol (SYMBICORT) 160-4.5 MCG/ACT inhaler Inhale 2 puffs into the lungs 2 (two) times daily.        . cetirizine (ZYRTEC) 10 MG tablet Take 10 mg by mouth daily.        . Chlorpheniramine Maleate 12 MG CP24 Use at bedtime daily  30 each  1  . cyclobenzaprine (FLEXERIL) 10 MG tablet Take 10 mg by mouth as needed.       . cycloSPORINE (RESTASIS) 0.05 % ophthalmic emulsion Place 1  drop into both eyes 2 (two) times daily.       Marland Kitchen gabapentin (NEURONTIN) 100 MG capsule Take 100 mg by mouth as directed.        Marland Kitchen glimepiride (AMARYL) 2 MG tablet Take 2 mg by mouth daily before breakfast.        . glycopyrrolate (ROBINUL) 2 MG tablet Take 1 tablet (2 mg total) by mouth 2 (two) times daily.  30 tablet  3  . insulin glargine (LANTUS) 100 UNIT/ML injection Inject 25 Units into the skin 2 (two) times daily.       . Olopatadine HCl (PATADAY) 0.2 % SOLN Place 1 drop into both eyes 2 (two) times daily.       Marland Kitchen omeprazole (PRILOSEC) 20 MG capsule Take 1 capsule (20 mg total) by mouth 2 (two) times daily.  60 capsule  6  . pioglitazone (ACTOS) 15 MG tablet Take 15 mg by mouth daily.        . predniSONE (DELTASONE) 10 MG tablet Take 4 for two days three for two days two for two days one for two days  20 tablet  0       Review of  Systems  11  point review of system is obtained and is negative except as noted above in history of present illness    Objective:   Physical Exam  Filed Vitals:   11/17/11 1330  BP: 122/96  Pulse: 95  Temp: 98.6 F (37 C)  TempSrc: Oral  Height: 5\' 7"  (1.702 m)  Weight: 196 lb (88.905 kg)  SpO2: 96%    Gen: Pleasant, well-nourished, in no distress,  normal affect  ENT: No lesions,  mouth clear,  oropharynx clear, no postnasal drip  Neck: No JVD, no TMG, no carotid bruits  Lungs: No use of accessory muscles, no dullness to percussion, no wheezing   Cardiovascular: RRR, heart sounds normal, no murmur or gallops, no peripheral edema  Abdomen: soft and NT, no HSM,  BS normal  Musculoskeletal: No deformities, no cyanosis or clubbing  Neuro: alert, non focal  Skin: Warm, no lesions or rashes        Assessment & Plan:   Sarcoidosis Stage III Sarcoidosis  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 atenolol (TENORMIN) 50 MG tablet Take 1 tablet (50 mg total) by mouth 2 (two) times daily.  60 tablet  2  . budesonide-formoterol (SYMBICORT) 160-4.5 MCG/ACT inhaler Inhale 2 puffs into the lungs 2 (two) times daily.        . cetirizine (ZYRTEC) 10 MG tablet Take 10 mg by mouth daily.        . Chlorpheniramine Maleate 12 MG CP24 Use at bedtime daily  30 each  1  . cyclobenzaprine (FLEXERIL) 10 MG tablet Take 10 mg by mouth as needed.       . cycloSPORINE (RESTASIS) 0.05 % ophthalmic emulsion Place 1 drop into both eyes 2 (two) times daily.       Marland Kitchen gabapentin (NEURONTIN) 100 MG capsule Take 100 mg by mouth as directed.        Marland Kitchen glimepiride (AMARYL) 2 MG tablet Take 2 mg by mouth daily before breakfast.        . glycopyrrolate (ROBINUL) 2 MG  tablet Take 1 tablet (2 mg total)  by mouth 2 (two) times daily.  30 tablet  3  . insulin glargine (LANTUS) 100 UNIT/ML injection Inject 25 Units into the skin 2 (two) times daily.       . Linaclotide 290 MCG CAPS Take 1 capsule by mouth daily.      . Olopatadine HCl (PATADAY) 0.2 % SOLN Place 1 drop into both eyes 2 (two) times daily.       Marland Kitchen omeprazole (PRILOSEC) 20 MG capsule Take 1 capsule (20 mg total) by mouth 2 (two) times daily.  60 capsule  6  . pioglitazone (ACTOS) 15 MG tablet Take 15 mg by mouth daily.        . fluticasone (FLONASE) 50 MCG/ACT nasal spray Place 2 sprays into the nose daily.  16 g  6  . traMADol (ULTRAM) 50 MG tablet Take per cyclic cough protocol  1-2 every 4-6 hours as needed  30 tablet  1  . DISCONTD: predniSONE (DELTASONE) 10 MG tablet Take 4 for two days three for two days two for two days one for two days  20 tablet  0

## 2011-11-17 NOTE — Patient Instructions (Addendum)
Start Flonase two sprays each nostril daily, sent to pharmacy Stay on symbicort  Start cough protocol as below. Tramadol sent to pharmacy.  Get Delsym Over the counter The key to effective treatment for your cough is eliminating the non-stop cycle of cough you're stuck in long enough to let your airway heal completely and then see if there is anything still making you cough once you stop the cough suppression, but this should take no more than 5 days to figure out   First take delsym two tsp every 12 hours and supplement if needed with tramadol 50 mg up to 2 every 4 hours to suppress the urge to cough.   Swallowing water or using ice chips/non mint and menthol containing candies (such as lifesavers or sugarless jolly ranchers) are also effective. You should rest your voice and avoid activities that you know make you cough.   Once you have eliminated the cough for 3 straight days try reducing the tramadol first, then the delsym as tolerated.   Stay on omeprazole .  GERD (REFLUX) is an extremely common cause of respiratory symptoms, many times with no significant heartburn at all. It can be treated with medication, but also with lifestyle changes including avoidance of late meals, excessive alcohol, smoking cessation, and avoid fatty foods, chocolate, peppermint, colas, red wine, and acidic juices such as orange juice.   NO MINT OR MENTHOL PRODUCTS SO NO COUGH DROPS USE SUGARLESS CANDY INSTEAD (jolley ranchers or Stover's) NO OIL BASED VITAMINS - use powdered substitutes.  Return 4 months

## 2011-11-18 NOTE — Assessment & Plan Note (Signed)
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

## 2011-12-21 ENCOUNTER — Ambulatory Visit (INDEPENDENT_AMBULATORY_CARE_PROVIDER_SITE_OTHER): Payer: Medicaid Other | Admitting: Gastroenterology

## 2011-12-21 ENCOUNTER — Encounter: Payer: Self-pay | Admitting: Gastroenterology

## 2011-12-21 VITALS — BP 120/82 | HR 86 | Ht 67.0 in | Wt 194.3 lb

## 2011-12-21 DIAGNOSIS — K5900 Constipation, unspecified: Secondary | ICD-10-CM

## 2011-12-21 NOTE — Patient Instructions (Addendum)
Follow up as needed

## 2011-12-21 NOTE — Progress Notes (Signed)
History of Present Illness:  [name] has returned for followup of constipation and rectal pain.On Linzess she is moving her bowels regularly.   Rectal pain is gone.    Review of Systems: Today she will awaken with an upset stomach has had multiple diarrheal stools. Here to 4 she had been feeling well. Pertinent positive and negative review of systems were noted in the above HPI section. All other review of systems were otherwise negative.    Current Medications, Allergies, Past Medical History, Past Surgical History, Family History and Social History were reviewed in [facility] electronic medical record  Vital signs were reviewed in today's medical record. Physical Exam: General: Well developed , well nourished, no acute distress

## 2011-12-21 NOTE — Assessment & Plan Note (Signed)
She's had an excellent response to higher dose Linzess.  We will continue with the same.

## 2012-01-07 ENCOUNTER — Encounter: Payer: Self-pay | Admitting: Internal Medicine

## 2012-01-07 ENCOUNTER — Ambulatory Visit (INDEPENDENT_AMBULATORY_CARE_PROVIDER_SITE_OTHER): Payer: Medicaid Other | Admitting: *Deleted

## 2012-01-07 DIAGNOSIS — I4581 Long QT syndrome: Secondary | ICD-10-CM

## 2012-01-07 LAB — ICD DEVICE OBSERVATION
BATTERY VOLTAGE: 3.06 V
BRDY-0002RV: 40 {beats}/min
CHARGE TIME: 8.11 s
RV LEAD AMPLITUDE: 9.3 mv
RV LEAD THRESHOLD: 1.5 V
TZAT-0001FASTVT: 1
TZAT-0001SLOWVT: 2
TZAT-0001SLOWVT: 3
TZAT-0001SLOWVT: 4
TZAT-0001SLOWVT: 5
TZAT-0001SLOWVT: 6
TZAT-0002SLOWVT: NEGATIVE
TZAT-0002SLOWVT: NEGATIVE
TZAT-0002SLOWVT: NEGATIVE
TZAT-0002SLOWVT: NEGATIVE
TZAT-0012SLOWVT: 200 ms
TZAT-0012SLOWVT: 200 ms
TZAT-0013FASTVT: 1
TZAT-0018FASTVT: NEGATIVE
TZAT-0018SLOWVT: NEGATIVE
TZAT-0018SLOWVT: NEGATIVE
TZAT-0018SLOWVT: NEGATIVE
TZAT-0018SLOWVT: NEGATIVE
TZAT-0019FASTVT: 8 V
TZAT-0019SLOWVT: 8 V
TZAT-0019SLOWVT: 8 V
TZAT-0019SLOWVT: 8 V
TZAT-0019SLOWVT: 8 V
TZAT-0020FASTVT: 1.6 ms
TZAT-0020SLOWVT: 1.6 ms
TZAT-0020SLOWVT: 1.6 ms
TZON-0003SLOWVT: 370 ms
TZON-0005SLOWVT: 12
TZON-0008FASTVT: 0 ms
TZON-0008SLOWVT: 0 ms
TZON-0011AFLUTTER: 70
TZST-0001FASTVT: 2
TZST-0001FASTVT: 4
TZST-0003FASTVT: 25 J
TZST-0003FASTVT: 35 J
TZST-0003FASTVT: 35 J
VENTRICULAR PACING ICD: 0 pct

## 2012-01-07 NOTE — Progress Notes (Signed)
ICD check 

## 2012-02-18 ENCOUNTER — Telehealth: Payer: Self-pay | Admitting: Critical Care Medicine

## 2012-02-18 NOTE — Telephone Encounter (Signed)
Called pt and left message x3 to make a follow up apt. No return call back. Sent letter 02/18/12 for pt to call and schd follow up.  °

## 2012-04-06 ENCOUNTER — Encounter: Payer: Self-pay | Admitting: *Deleted

## 2012-04-11 ENCOUNTER — Encounter: Payer: Self-pay | Admitting: Internal Medicine

## 2012-04-11 ENCOUNTER — Ambulatory Visit (INDEPENDENT_AMBULATORY_CARE_PROVIDER_SITE_OTHER): Payer: Medicaid Other | Admitting: *Deleted

## 2012-04-11 DIAGNOSIS — I4581 Long QT syndrome: Secondary | ICD-10-CM

## 2012-04-11 LAB — ICD DEVICE OBSERVATION
BRDY-0002RV: 40 {beats}/min
CHARGE TIME: 8.23 s
FVT: 0
RV LEAD AMPLITUDE: 9.6 mv
RV LEAD IMPEDENCE ICD: 448 Ohm
RV LEAD THRESHOLD: 1.5 V
TZAT-0001FASTVT: 1
TZAT-0001SLOWVT: 1
TZAT-0001SLOWVT: 5
TZAT-0001SLOWVT: 6
TZAT-0002SLOWVT: NEGATIVE
TZAT-0002SLOWVT: NEGATIVE
TZAT-0002SLOWVT: NEGATIVE
TZAT-0002SLOWVT: NEGATIVE
TZAT-0012SLOWVT: 200 ms
TZAT-0018FASTVT: NEGATIVE
TZAT-0018SLOWVT: NEGATIVE
TZAT-0018SLOWVT: NEGATIVE
TZAT-0019FASTVT: 8 V
TZAT-0019SLOWVT: 8 V
TZAT-0019SLOWVT: 8 V
TZAT-0019SLOWVT: 8 V
TZAT-0020SLOWVT: 1.6 ms
TZON-0004SLOWVT: 16
TZON-0008FASTVT: 0 ms
TZST-0001FASTVT: 2
TZST-0001FASTVT: 4
TZST-0001FASTVT: 6
TZST-0003FASTVT: 15 J
TZST-0003FASTVT: 35 J
TZST-0003FASTVT: 35 J
VF: 0

## 2012-04-11 NOTE — Progress Notes (Signed)
ICD check 

## 2012-04-11 NOTE — Patient Instructions (Addendum)
Return office visit 07/04/12@9 :30am with the device clinic.

## 2012-06-28 ENCOUNTER — Telehealth: Payer: Self-pay | Admitting: Critical Care Medicine

## 2012-06-28 ENCOUNTER — Telehealth: Payer: Self-pay | Admitting: Gastroenterology

## 2012-06-28 MED ORDER — BENZONATATE 200 MG PO CAPS: 200.0000 mg | ORAL_CAPSULE | Freq: Three times a day (TID) | ORAL | Status: DC | PRN

## 2012-06-28 NOTE — Telephone Encounter (Signed)
Per cy ok to fill  rx sent an pt aware nothing further needed.

## 2012-06-28 NOTE — Telephone Encounter (Signed)
Spoke with pt having a productive cough Would like rx for Tessalon pearls 200mg  takes 1 tablet tid #60 with 1 refill Allergies  Allergen Reactions  . Codeine   . Iohexol      Code: RASH, Desc: PT STATES SHE IS ALLERGIC TO IV CONTRAST 05/28/06/RM, Onset Date: 16109604   . Meperidine Hcl    Dr Delford Field not in office will send to  Dr Maple Hudson  please advise  Thank you

## 2012-06-29 ENCOUNTER — Telehealth: Payer: Self-pay | Admitting: Critical Care Medicine

## 2012-06-29 MED ORDER — LINACLOTIDE 290 MCG PO CAPS: 1.0000 | ORAL_CAPSULE | Freq: Every day | ORAL | Status: DC

## 2012-06-29 NOTE — Telephone Encounter (Signed)
Left message for pt to call back  °

## 2012-06-29 NOTE — Telephone Encounter (Signed)
Pt called and was given Linzess samples. Pt is requesting a script be sent to the pharmacy for her. Rx sent to the pharmacy.

## 2012-07-04 ENCOUNTER — Ambulatory Visit (INDEPENDENT_AMBULATORY_CARE_PROVIDER_SITE_OTHER): Payer: Medicaid Other | Admitting: *Deleted

## 2012-07-04 ENCOUNTER — Other Ambulatory Visit: Payer: Self-pay | Admitting: Internal Medicine

## 2012-07-04 ENCOUNTER — Encounter: Payer: Self-pay | Admitting: Internal Medicine

## 2012-07-04 DIAGNOSIS — I4581 Long QT syndrome: Secondary | ICD-10-CM

## 2012-07-04 DIAGNOSIS — Z95810 Presence of automatic (implantable) cardiac defibrillator: Secondary | ICD-10-CM

## 2012-07-04 LAB — ICD DEVICE OBSERVATION
BATTERY VOLTAGE: 3.03 V
FVT: 0
PACEART VT: 1
TZAT-0001SLOWVT: 1
TZAT-0001SLOWVT: 3
TZAT-0001SLOWVT: 4
TZAT-0002SLOWVT: NEGATIVE
TZAT-0002SLOWVT: NEGATIVE
TZAT-0002SLOWVT: NEGATIVE
TZAT-0004FASTVT: 8
TZAT-0012FASTVT: 200 ms
TZAT-0012SLOWVT: 200 ms
TZAT-0012SLOWVT: 200 ms
TZAT-0012SLOWVT: 200 ms
TZAT-0012SLOWVT: 200 ms
TZAT-0018SLOWVT: NEGATIVE
TZAT-0018SLOWVT: NEGATIVE
TZAT-0018SLOWVT: NEGATIVE
TZAT-0019SLOWVT: 8 V
TZAT-0019SLOWVT: 8 V
TZAT-0019SLOWVT: 8 V
TZAT-0019SLOWVT: 8 V
TZAT-0020FASTVT: 1.6 ms
TZAT-0020SLOWVT: 1.6 ms
TZAT-0020SLOWVT: 1.6 ms
TZAT-0020SLOWVT: 1.6 ms
TZON-0003FASTVT: 250 ms
TZON-0011AFLUTTER: 70
TZST-0001FASTVT: 2
TZST-0001FASTVT: 4
TZST-0001FASTVT: 6
TZST-0003FASTVT: 15 J
TZST-0003FASTVT: 35 J
TZST-0003FASTVT: 35 J

## 2012-07-04 NOTE — Progress Notes (Signed)
defib check in clinic  

## 2012-07-04 NOTE — Telephone Encounter (Signed)
Old msg Tessalon pearls refill on 06/28/12 # 30 with 1 refill

## 2012-07-29 ENCOUNTER — Telehealth: Payer: Self-pay | Admitting: Critical Care Medicine

## 2012-07-29 MED ORDER — HYDROCODONE-HOMATROPINE 5-1.5 MG/5ML PO SYRP: 5.0000 mL | ORAL_SOLUTION | Freq: Four times a day (QID) | ORAL | Status: DC | PRN

## 2012-07-29 NOTE — Telephone Encounter (Signed)
ATC patient, no answer [id number] 

## 2012-07-29 NOTE — Telephone Encounter (Signed)
Returning call can be reached at [phone].[name]

## 2012-07-29 NOTE — Telephone Encounter (Signed)
I spoke with pt. She stated her cough is getting worse. She is scheduled to see [facility] on Monday at 2:30. Medicaid no longer covers the tessalon pearls. She has been taking OTC delsym and robitussin. She has been using sugarless candy and noting seems to be helping. She is wanting to know if Dr. Delford Field can call her something in until she can get in to be seen on Monday. Please advise thanks  Allergies  Allergen Reactions  . Codeine   . Iohexol      Code: RASH, Desc: PT STATES SHE IS ALLERGIC TO IV CONTRAST 05/28/06/RM, Onset Date: [id number]   . Meperidine Hcl

## 2012-07-29 NOTE — Telephone Encounter (Signed)
Hycodan cough syrup qid prn #12ml

## 2012-07-29 NOTE — Telephone Encounter (Signed)
i spoke with pt and is aware of PW recs. i have called RX into [facility] and gave VO. Nothing further was needed

## 2012-08-01 ENCOUNTER — Ambulatory Visit: Payer: Medicaid Other | Admitting: Pulmonary Disease

## 2012-08-01 ENCOUNTER — Telehealth: Payer: Self-pay | Admitting: Critical Care Medicine

## 2012-08-01 NOTE — Telephone Encounter (Signed)
LMTCB x 1 

## 2012-08-02 NOTE — Telephone Encounter (Signed)
lmomtcb  

## 2012-08-03 NOTE — Telephone Encounter (Signed)
lmomtcb x 3--- left message on both numbers available in chart. As patient is sick, we will attempt to call one more time before signing off on message.

## 2012-08-04 NOTE — Telephone Encounter (Signed)
Attempted to call pt back x 4 times.  lmomtcb for the pt if she needs anything further to call back.  Will sign off of this message.

## 2012-08-26 ENCOUNTER — Encounter: Payer: Self-pay | Admitting: Internal Medicine

## 2012-10-05 ENCOUNTER — Encounter: Payer: Self-pay | Admitting: Internal Medicine

## 2012-10-05 ENCOUNTER — Ambulatory Visit (INDEPENDENT_AMBULATORY_CARE_PROVIDER_SITE_OTHER): Payer: Medicaid Other | Admitting: Internal Medicine

## 2012-10-05 VITALS — BP 130/86 | HR 85 | Ht 67.0 in | Wt 196.0 lb

## 2012-10-05 DIAGNOSIS — I4581 Long QT syndrome: Secondary | ICD-10-CM

## 2012-10-05 DIAGNOSIS — Q898 Other specified congenital malformations: Secondary | ICD-10-CM

## 2012-10-05 DIAGNOSIS — Z95810 Presence of automatic (implantable) cardiac defibrillator: Secondary | ICD-10-CM

## 2012-10-05 LAB — ICD DEVICE OBSERVATION
BATTERY VOLTAGE: 3 V
BRDY-0002RV: 40 {beats}/min
DEV-0020ICD: NEGATIVE
RV LEAD THRESHOLD: 1.5 V
TZAT-0001FASTVT: 1
TZAT-0001SLOWVT: 1
TZAT-0001SLOWVT: 2
TZAT-0001SLOWVT: 3
TZAT-0002SLOWVT: NEGATIVE
TZAT-0002SLOWVT: NEGATIVE
TZAT-0002SLOWVT: NEGATIVE
TZAT-0012FASTVT: 200 ms
TZAT-0012SLOWVT: 200 ms
TZAT-0012SLOWVT: 200 ms
TZAT-0012SLOWVT: 200 ms
TZAT-0013FASTVT: 1
TZAT-0018SLOWVT: NEGATIVE
TZAT-0018SLOWVT: NEGATIVE
TZAT-0019FASTVT: 8 V
TZAT-0019SLOWVT: 8 V
TZAT-0019SLOWVT: 8 V
TZAT-0020FASTVT: 1.6 ms
TZAT-0020SLOWVT: 1.6 ms
TZAT-0020SLOWVT: 1.6 ms
TZAT-0020SLOWVT: 1.6 ms
TZON-0003FASTVT: 250 ms
TZON-0005SLOWVT: 12
TZON-0008FASTVT: 0 ms
TZON-0008SLOWVT: 0 ms
TZON-0011AFLUTTER: 70
TZST-0001FASTVT: 2
TZST-0001FASTVT: 4
TZST-0001FASTVT: 6
TZST-0003FASTVT: 25 J
TZST-0003FASTVT: 35 J

## 2012-10-05 MED ORDER — ATENOLOL 50 MG PO TABS: 50.0000 mg | ORAL_TABLET | Freq: Two times a day (BID) | ORAL | Status: DC

## 2012-10-05 NOTE — Assessment & Plan Note (Signed)
Her Medtronic ICD is working normally. We'll plan to recheck in several months. 

## 2012-10-05 NOTE — Progress Notes (Signed)
HPI [name] returns today for followup. She is a pleasant 42 year old woman with a history of long QT syndrome and a very strong family history of sudden cardiac death, status post ICD implantation. She also has a history of SVT and is status post catheter ablation. In the interim, she has been stable. Her only complaint today is that of trouble sleeping. She states that she has trouble getting to sleep and staying asleep. No syncope. No peripheral edema. No chest pain or shortness of breath. Allergies  Allergen Reactions  . Codeine   . Iohexol      Code: RASH, Desc: PT STATES SHE IS ALLERGIC TO IV CONTRAST 05/28/06/RM, Onset Date: 16109604   . Meperidine Hcl      Current Outpatient Prescriptions  Medication Sig Dispense Refill  . atenolol (TENORMIN) 50 MG tablet Take 1 tablet (50 mg total) by mouth 2 (two) times daily.  90 tablet  3  . benzonatate (TESSALON) 200 MG capsule Take 1 capsule (200 mg total) by mouth 3 (three) times daily as needed for cough.  30 capsule  1  . budesonide-formoterol (SYMBICORT) 160-4.5 MCG/ACT inhaler Inhale 2 puffs into the lungs 2 (two) times daily.        . cetirizine (ZYRTEC) 10 MG tablet Take 10 mg by mouth daily.        . Chlorpheniramine Maleate 12 MG CP24 Use at bedtime daily  30 each  1  . cyclobenzaprine (FLEXERIL) 10 MG tablet Take 10 mg by mouth as needed.       . cycloSPORINE (RESTASIS) 0.05 % ophthalmic emulsion Place 1 drop into both eyes 2 (two) times daily.       . fluticasone (FLONASE) 50 MCG/ACT nasal spray Place 2 sprays into the nose daily.  16 g  6  . gabapentin (NEURONTIN) 100 MG capsule Take 100 mg by mouth as directed.        Marland Kitchen glimepiride (AMARYL) 2 MG tablet Take 2 mg by mouth daily before breakfast.        . glycopyrrolate (ROBINUL) 2 MG tablet Take 1 tablet (2 mg total) by mouth 2 (two) times daily.  30 tablet  3  . HYDROcodone-homatropine (HYCODAN) 5-1.5 MG/5ML syrup Take 5 mLs by mouth 4 (four) times daily as needed for cough.   180 mL  0  . insulin aspart (NOVOLOG) 100 UNIT/ML injection Inject into the skin 3 (three) times daily before meals. Sliding scale      . insulin glargine (LANTUS) 100 UNIT/ML injection Inject 35 Units into the skin daily.       . Linaclotide 290 MCG CAPS Take 1 capsule by mouth daily.  30 capsule  1  . Olopatadine HCl (PATADAY) 0.2 % SOLN Place 1 drop into both eyes 2 (two) times daily.       Marland Kitchen omeprazole (PRILOSEC) 20 MG capsule Take 1 capsule (20 mg total) by mouth 2 (two) times daily.  60 capsule  6   No current facility-administered medications for this visit.     Past Medical History  Diagnosis Date  . Long Q-T syndrome   . Palpitation   . Seizure disorder   . Abdominal pain, periumbilic   . Esophageal stricture   . Sarcoidosis   . HTN (hypertension)   . DM (diabetes mellitus)   . Allergic rhinitis   . Bronchitis   . Sarcoidosis   . GERD (gastroesophageal reflux disease)   . Asthma   . Fibromyalgia     ROS:  All systems reviewed and negative except as noted in the HPI.   Past Surgical History  Procedure Laterality Date  . Tubal ligation    . Hand surgery    . Svt ablation    . Automatic implantable cardiac defibrillator situ      ICD-Medtronic   Remote - Yes      Family History  Problem Relation Age of Onset  . Cancer    . Diabetes       History   Social History  . Marital Status: Single    Spouse Name: N/A    Number of Children: N/A  . Years of Education: N/A   Occupational History  . Not on file.   Social History Main Topics  . Smoking status: Never Smoker   . Smokeless tobacco: Never Used  . Alcohol Use: Yes  . Drug Use: No  . Sexually Active: Not on file   Other Topics Concern  . Not on file   Social History Narrative  . No narrative on file     BP 130/86  Pulse 85  Ht 5\' 7"  (1.702 m)  Wt 196 lb (88.905 kg)  BMI 30.69 kg/m2  Physical Exam:  Well appearing 42 year old woman, NAD HEENT: Unremarkable Neck:  7 cm JVD, no  thyromegally Back:  No CVA tenderness Lungs:  Clear with no wheezes, rales, or rhonchi. HEART:  Regular rate rhythm, no murmurs, no rubs, no clicks Abd:  soft, positive bowel sounds, no organomegally, no rebound, no guarding Ext:  2 plus pulses, no edema, no cyanosis, no clubbing Skin:  No rashes no nodules Neuro:  CN II through XII intact, motor grossly intact  EKG - normal sinus rhythm with nonspecific ST-T wave abnormality, prolonged QT, 490 ms.   DEVICE  Normal device function.  See PaceArt for details.   Assess/Plan:

## 2012-10-05 NOTE — Assessment & Plan Note (Signed)
She has had no ventricular arrhythmias. Her QT interval remains prolonged, despite beta blocker therapy.

## 2012-10-05 NOTE — Patient Instructions (Addendum)
Your physician recommends that you schedule a follow-up appointment in: 3 months in the device clinic.   Your physician wants you to follow-up in: 1 year with Dr. Ladona Ridgel. You will receive a reminder letter in the mail two months in advance. If you don't receive a letter, please call our office to schedule the follow-up appointment.  No changes have been made to your medications today

## 2012-10-17 ENCOUNTER — Other Ambulatory Visit (HOSPITAL_COMMUNITY): Payer: Self-pay | Admitting: Obstetrics

## 2012-10-17 DIAGNOSIS — Z1231 Encounter for screening mammogram for malignant neoplasm of breast: Secondary | ICD-10-CM

## 2012-10-20 ENCOUNTER — Ambulatory Visit (HOSPITAL_COMMUNITY): Payer: Medicaid Other

## 2013-01-05 ENCOUNTER — Ambulatory Visit (INDEPENDENT_AMBULATORY_CARE_PROVIDER_SITE_OTHER): Payer: Medicaid Other | Admitting: *Deleted

## 2013-01-05 DIAGNOSIS — I4581 Long QT syndrome: Secondary | ICD-10-CM

## 2013-01-05 DIAGNOSIS — Z95810 Presence of automatic (implantable) cardiac defibrillator: Secondary | ICD-10-CM

## 2013-01-05 LAB — ICD DEVICE OBSERVATION
BATTERY VOLTAGE: 2.99 V
BRDY-0002RV: 40 {beats}/min
CHARGE TIME: 8.42 s
RV LEAD AMPLITUDE: 9.9 mv
RV LEAD IMPEDENCE ICD: 440 Ohm
TZAT-0001FASTVT: 1
TZAT-0001SLOWVT: 1
TZAT-0001SLOWVT: 4
TZAT-0001SLOWVT: 5
TZAT-0001SLOWVT: 6
TZAT-0002SLOWVT: NEGATIVE
TZAT-0002SLOWVT: NEGATIVE
TZAT-0002SLOWVT: NEGATIVE
TZAT-0005FASTVT: 88 pct
TZAT-0012SLOWVT: 200 ms
TZAT-0012SLOWVT: 200 ms
TZAT-0012SLOWVT: 200 ms
TZAT-0012SLOWVT: 200 ms
TZAT-0018FASTVT: NEGATIVE
TZAT-0018SLOWVT: NEGATIVE
TZAT-0018SLOWVT: NEGATIVE
TZAT-0018SLOWVT: NEGATIVE
TZAT-0019SLOWVT: 8 V
TZAT-0019SLOWVT: 8 V
TZAT-0020FASTVT: 1.6 ms
TZAT-0020SLOWVT: 1.6 ms
TZAT-0020SLOWVT: 1.6 ms
TZAT-0020SLOWVT: 1.6 ms
TZON-0008SLOWVT: 0 ms
TZON-0011AFLUTTER: 70
TZST-0001FASTVT: 2
TZST-0001FASTVT: 4
TZST-0001FASTVT: 5
TZST-0001FASTVT: 6
TZST-0003FASTVT: 35 J
TZST-0003FASTVT: 35 J
VF: 0

## 2013-01-05 NOTE — Progress Notes (Signed)
ICD check in clinic, normal device function. Decreased RV output from 3.0V to 2.5V. Change pulse width from 0.19ms to 0.4ms. Full details in PaceArt.  ROV w/ device clinic 04/10/13 @ 11:00

## 2013-02-15 ENCOUNTER — Encounter: Payer: Self-pay | Admitting: Internal Medicine

## 2013-03-10 ENCOUNTER — Telehealth: Payer: Self-pay | Admitting: Gastroenterology

## 2013-03-10 MED ORDER — LINACLOTIDE 290 MCG PO CAPS: 290.0000 ug | ORAL_CAPSULE | Freq: Every day | ORAL | Status: DC

## 2013-03-10 NOTE — Telephone Encounter (Signed)
Pt reports she hasn't had a BM in 3 days and she's miserable. I asked about Linzess and she states she called about a refill and no one called her back. Reordered Linzess for her; pt stated understanding.

## 2013-03-13 ENCOUNTER — Telehealth: Payer: Self-pay | Admitting: Gastroenterology

## 2013-03-14 NOTE — Telephone Encounter (Signed)
Sent message to Zella Ball

## 2013-03-15 ENCOUNTER — Telehealth: Payer: Self-pay | Admitting: Gastroenterology

## 2013-03-15 NOTE — Telephone Encounter (Signed)
PATIENT CAME AND PICKED UP SAMPLES

## 2013-03-15 NOTE — Telephone Encounter (Signed)
Left messaged for pt to come get samples

## 2013-04-24 ENCOUNTER — Telehealth: Payer: Self-pay | Admitting: Internal Medicine

## 2013-04-24 NOTE — Telephone Encounter (Signed)
Spoke w/ pt's mother---gave my number to call back.

## 2013-04-24 NOTE — Telephone Encounter (Signed)
Pt wants an official letter to verify she has a defibrillator. Per pt, [facility] [employer] will not let her enter without a directly addressed letter.  ROV w/ device clinic 04/27/13.

## 2013-04-24 NOTE — Telephone Encounter (Signed)
New Problem:  Pt states she needs a letter stating she cannot be wanded by security checks due to her defibulator.. Pt states the letter can state that she be physically padded down if necessary. Pt is requesting a call back.

## 2013-04-25 ENCOUNTER — Encounter: Payer: Self-pay | Admitting: *Deleted

## 2013-04-27 ENCOUNTER — Ambulatory Visit (INDEPENDENT_AMBULATORY_CARE_PROVIDER_SITE_OTHER): Payer: Medicaid Other | Admitting: *Deleted

## 2013-04-27 ENCOUNTER — Encounter: Payer: Self-pay | Admitting: *Deleted

## 2013-04-27 DIAGNOSIS — I4581 Long QT syndrome: Secondary | ICD-10-CM

## 2013-04-27 LAB — MDC_IDC_ENUM_SESS_TYPE_INCLINIC
Brady Statistic RV Percent Paced: 0 %
HighPow Impedance: 43 Ohm
HighPow Impedance: 44 Ohm
HighPow Impedance: 45 Ohm
HighPow Impedance: 46 Ohm
HighPow Impedance: 48 Ohm
HighPow Impedance: 49 Ohm
HighPow Impedance: 49 Ohm
HighPow Impedance: 50 Ohm
HighPow Impedance: 54 Ohm
HighPow Impedance: 54 Ohm
HighPow Impedance: 56 Ohm
HighPow Impedance: 58 Ohm
HighPow Impedance: 58 Ohm
HighPow Impedance: 59 Ohm
HighPow Impedance: 64 Ohm
Lead Channel Impedance Value: 424 Ohm
Lead Channel Impedance Value: 432 Ohm
Lead Channel Impedance Value: 432 Ohm
Lead Channel Impedance Value: 440 Ohm
Lead Channel Impedance Value: 456 Ohm
Lead Channel Impedance Value: 456 Ohm
Lead Channel Impedance Value: 472 Ohm
Lead Channel Impedance Value: 488 Ohm
Lead Channel Impedance Value: 504 Ohm
Lead Channel Sensing Intrinsic Amplitude: 6.9 mV
Lead Channel Sensing Intrinsic Amplitude: 7.5 mV
Lead Channel Sensing Intrinsic Amplitude: 7.8 mV
Lead Channel Sensing Intrinsic Amplitude: 8.5 mV
Lead Channel Sensing Intrinsic Amplitude: 8.5 mV
Lead Channel Sensing Intrinsic Amplitude: 8.8 mV
Lead Channel Sensing Intrinsic Amplitude: 9 mV
Lead Channel Sensing Intrinsic Amplitude: 9.1 mV
Lead Channel Sensing Intrinsic Amplitude: 9.1 mV
Lead Channel Setting Pacing Amplitude: 2.5 V
Lead Channel Setting Pacing Pulse Width: 0.9 ms
Zone Setting Detection Interval: 300 ms
Zone Setting Detection Interval: 370 ms

## 2013-04-27 NOTE — Progress Notes (Signed)
ICD check in clinic. Normal device function. Thresholds and sensing consistent with previous device measurements. Impedance trends stable over time. No evidence of any ventricular arrhythmias.  Histogram distribution appropriate for patient and level of activity. No changes made this session. Device programmed at appropriate safety margins. Device programmed to optimize intrinsic conduction.  Plan to check device every 3 months remotely and in office annually. Patient education completed including shock plan. Alert tones/vibration demonstrated for patient.  ROV in March with the device clinic.

## 2013-04-28 ENCOUNTER — Other Ambulatory Visit (HOSPITAL_COMMUNITY): Payer: Self-pay | Admitting: Obstetrics

## 2013-04-28 DIAGNOSIS — N926 Irregular menstruation, unspecified: Secondary | ICD-10-CM

## 2013-05-04 ENCOUNTER — Ambulatory Visit (HOSPITAL_COMMUNITY)
Admission: RE | Admit: 2013-05-04 | Discharge: 2013-05-04 | Disposition: A | Discharge status: Home / Self Care | Payer: Medicaid Other | Source: Ambulatory Visit | Attending: Obstetrics | Admitting: Obstetrics

## 2013-05-04 DIAGNOSIS — D259 Leiomyoma of uterus, unspecified: Secondary | ICD-10-CM | POA: Insufficient documentation

## 2013-05-04 DIAGNOSIS — IMO0002 Reserved for concepts with insufficient information to code with codable children: Secondary | ICD-10-CM | POA: Insufficient documentation

## 2013-05-04 DIAGNOSIS — N949 Unspecified condition associated with female genital organs and menstrual cycle: Secondary | ICD-10-CM | POA: Insufficient documentation

## 2013-05-04 DIAGNOSIS — N938 Other specified abnormal uterine and vaginal bleeding: Secondary | ICD-10-CM | POA: Insufficient documentation

## 2013-05-04 DIAGNOSIS — N926 Irregular menstruation, unspecified: Secondary | ICD-10-CM

## 2013-05-04 DIAGNOSIS — N946 Dysmenorrhea, unspecified: Secondary | ICD-10-CM | POA: Insufficient documentation

## 2013-05-15 ENCOUNTER — Encounter: Payer: Self-pay | Admitting: Internal Medicine

## 2013-05-17 ENCOUNTER — Other Ambulatory Visit: Payer: Self-pay | Admitting: Obstetrics

## 2013-05-30 ENCOUNTER — Encounter (HOSPITAL_COMMUNITY): Payer: Self-pay | Admitting: Pharmacist

## 2013-06-02 ENCOUNTER — Encounter (HOSPITAL_COMMUNITY)
Admission: RE | Admit: 2013-06-02 | Discharge: 2013-06-02 | Disposition: A | Discharge status: Home / Self Care | Payer: Medicaid Other | Source: Ambulatory Visit | Attending: Obstetrics | Admitting: Obstetrics

## 2013-06-02 ENCOUNTER — Other Ambulatory Visit: Payer: Self-pay

## 2013-06-02 ENCOUNTER — Encounter (INDEPENDENT_AMBULATORY_CARE_PROVIDER_SITE_OTHER): Payer: Self-pay

## 2013-06-02 ENCOUNTER — Encounter (HOSPITAL_COMMUNITY): Payer: Self-pay

## 2013-06-02 DIAGNOSIS — Z01810 Encounter for preprocedural cardiovascular examination: Secondary | ICD-10-CM | POA: Insufficient documentation

## 2013-06-02 DIAGNOSIS — Z01812 Encounter for preprocedural laboratory examination: Secondary | ICD-10-CM | POA: Insufficient documentation

## 2013-06-02 HISTORY — DX: Type 2 diabetes mellitus with diabetic neuropathy, unspecified: E11.40

## 2013-06-02 HISTORY — DX: Malignant (primary) neoplasm, unspecified: C80.1

## 2013-06-02 HISTORY — DX: Cerebral infarction, unspecified: I63.9

## 2013-06-02 HISTORY — DX: Other specified disorders of eye and adnexa: H57.89

## 2013-06-02 HISTORY — DX: Unspecified osteoarthritis, unspecified site: M19.90

## 2013-06-02 HISTORY — DX: Other hemorrhoids: K64.8

## 2013-06-02 HISTORY — DX: Presence of automatic (implantable) cardiac defibrillator: Z95.810

## 2013-06-02 LAB — CBC
HCT: 42.1 % (ref 36.0–46.0)
HEMOGLOBIN: 15 g/dL (ref 12.0–15.0)
MCH: 29.8 pg (ref 26.0–34.0)
MCHC: 35.6 g/dL (ref 30.0–36.0)
MCV: 83.7 fL (ref 78.0–100.0)
Platelets: 295 10*3/uL (ref 150–400)
RBC: 5.03 MIL/uL (ref 3.87–5.11)
RDW: 12.9 % (ref 11.5–15.5)
WBC: 9 10*3/uL (ref 4.0–10.5)

## 2013-06-02 LAB — BASIC METABOLIC PANEL
BUN: 5 mg/dL — ABNORMAL LOW (ref 6–23)
CO2: 26 mEq/L (ref 19–32)
CREATININE: 0.58 mg/dL (ref 0.50–1.10)
Calcium: 9.7 mg/dL (ref 8.4–10.5)
Chloride: 98 mEq/L (ref 96–112)
GFR calc Af Amer: >90 mL/min (ref >90)
GFR calc non Af Amer: >90 mL/min (ref >90)
GLUCOSE: 193 mg/dL — AB (ref 70–99)
Potassium: 4.4 mEq/L (ref 3.7–5.3)
SODIUM: 136 meq/L — AB (ref 137–147)

## 2013-06-02 NOTE — Patient Instructions (Addendum)
Your procedure is scheduled on: Wednesday, June 07, 2013  Enter through the [employer] [facility] at: 8:30AM  Pick up the phone at the desk and dial [phone].  Call this number if you have problems the morning of surgery: [phone].  Remember: Do NOT eat food: AFTER MIDNIGHT TUESDAY Do NOT drink clear liquids after: AFTER MIDNIGHT TUESDAY Take these medicines the morning of surgery with a SIP OF WATER: ATENOLOL, OMEPRAZOLE *DO NOT TAKE METFORMIN 24 HOURS PRIOR TO SURGERY* *TAKE FULL INSULIN DOSE AT DINNER* *TAKE 1/2 INSULIN DOSE AT BEDTIME* *BRING ASTHMA INHALER DAY OF SURGERY*  Do NOT wear jewelry (body piercing), make-up, or nail polish. Do NOT wear lotions, powders, or perfumes.  You may wear deoderant. Do NOT shave for 48 hours prior to surgery. Do NOT bring valuables to the hospital. Contacts, dentures, or bridgework may not be worn into surgery.  Have a responsible adult drive you home and stay with you for 24 hours after your procedure

## 2013-06-02 NOTE — Progress Notes (Signed)
Dr. Wanda Plump. hatchett made aware of pts EKG finding no new orders received at this time

## 2013-06-03 NOTE — H&P (Signed)
[name], CORTNER              ACCOUNT NO.:  [id number]  MEDICAL RECORD NO.:  [id number]  LOCATION:  [facility]                           FACILITY:  [facility]  PHYSICIAN:  Frederico Hamman, M.D.DATE OF BIRTH:  1971-03-16  DATE OF ADMISSION:  06/02/2013 DATE OF DISCHARGE:                             HISTORY & PHYSICAL   The patient is a 43 year old, gravida 6, para 4-0-2-3 with a history of dysfunctional uterine bleeding, and she is in for hysteroscopy, D and C, and HTA.  She had a previous NovaSure ablation in 2007.  PAST MEDICAL HISTORY:  The patient has a defibrillator.  She is diabetic.  She has sarcoidosis.  She has neuropathy, COPD, asthma.  Her list of medication is available from the patient.  PAST SURGICAL HISTORY:  She had an endometrial biopsy, September 01, 2011, and she had a previous NovaSure ablation in 2007.  SYSTEM REVIEW:  Negative.  SOCIAL HISTORY:  Negative.  PHYSICAL EXAMINATION:  GENERAL:  A well-developed female in no distress. HEENT:  Negative. LUNGS:  Clear to P and A. HEART:  Regular rhythm.  No murmurs, no gallops. ABDOMEN:  Negative. PELVIC:  Uterus, multiple small fibroids.  Negative adnexa.  Pap smear negative. EXTERNAL GENITALIA:  Negative.          ______________________________ Frederico Hamman, M.D.     BAM/MEDQ  D:  06/02/2013  T:  06/03/2013  Job:  [id number]

## 2013-06-06 ENCOUNTER — Telehealth: Payer: Self-pay | Admitting: Internal Medicine

## 2013-06-06 NOTE — Telephone Encounter (Signed)
New message     Pt is having surgery tomorrow.  They have faxed a clearance twice regarding the defibulator and what to do with it during surgery.  pls fax clearance to [phone]  Today.

## 2013-06-06 NOTE — Telephone Encounter (Signed)
[name] is faxing form over

## 2013-06-06 NOTE — Progress Notes (Signed)
Dr. Seward Speck made aware that the orders from Dr. Cristopher Peru for ICD have returned from his office

## 2013-06-07 ENCOUNTER — Encounter (HOSPITAL_COMMUNITY): Payer: Medicaid Other | Admitting: Anesthesiology

## 2013-06-07 ENCOUNTER — Ambulatory Visit (HOSPITAL_COMMUNITY): Payer: Medicaid Other | Admitting: Anesthesiology

## 2013-06-07 ENCOUNTER — Encounter (HOSPITAL_COMMUNITY)
Admission: RE | Disposition: A | Discharge status: Home / Self Care | Payer: Self-pay | Source: Ambulatory Visit | Attending: Obstetrics

## 2013-06-07 ENCOUNTER — Ambulatory Visit (HOSPITAL_COMMUNITY)
Admission: RE | Admit: 2013-06-07 | Discharge: 2013-06-07 | Disposition: A | Discharge status: Home / Self Care | Payer: Medicaid Other | Source: Ambulatory Visit | Attending: Obstetrics | Admitting: Obstetrics

## 2013-06-07 DIAGNOSIS — N925 Other specified irregular menstruation: Secondary | ICD-10-CM | POA: Insufficient documentation

## 2013-06-07 DIAGNOSIS — D259 Leiomyoma of uterus, unspecified: Secondary | ICD-10-CM | POA: Insufficient documentation

## 2013-06-07 DIAGNOSIS — N938 Other specified abnormal uterine and vaginal bleeding: Secondary | ICD-10-CM | POA: Insufficient documentation

## 2013-06-07 DIAGNOSIS — N949 Unspecified condition associated with female genital organs and menstrual cycle: Secondary | ICD-10-CM | POA: Insufficient documentation

## 2013-06-07 HISTORY — PX: DILITATION & CURRETTAGE/HYSTROSCOPY WITH HYDROTHERMAL ABLATION: SHX5570

## 2013-06-07 LAB — GLUCOSE, CAPILLARY: Glucose-Capillary: 295 mg/dL — ABNORMAL HIGH (ref 70–99)

## 2013-06-07 SURGERY — DILATATION & CURETTAGE/HYSTEROSCOPY WITH HYDROTHERMAL ABLATION
Anesthesia: General

## 2013-06-07 MED ORDER — FENTANYL CITRATE 0.05 MG/ML IJ SOLN
25.0000 ug | INTRAMUSCULAR | Status: DC | PRN
  Administered 2013-06-07: 50 ug via INTRAVENOUS

## 2013-06-07 MED ORDER — LIDOCAINE HCL 1 % IJ SOLN
INTRAMUSCULAR | Status: DC | PRN
  Administered 2013-06-07: 10 mL

## 2013-06-07 MED ORDER — ONDANSETRON HCL 4 MG/2ML IJ SOLN
INTRAMUSCULAR | Status: DC | PRN
  Administered 2013-06-07: 4 mg via INTRAVENOUS

## 2013-06-07 MED ORDER — FENTANYL CITRATE 0.05 MG/ML IJ SOLN
INTRAMUSCULAR | Status: DC | PRN
  Administered 2013-06-07 (×3): 50 ug via INTRAVENOUS

## 2013-06-07 MED ORDER — MIDAZOLAM HCL 2 MG/2ML IJ SOLN
INTRAMUSCULAR | Status: DC | PRN
  Administered 2013-06-07: 2 mg via INTRAVENOUS

## 2013-06-07 MED ORDER — ONDANSETRON HCL 4 MG/2ML IJ SOLN
INTRAMUSCULAR | Status: AC
  Filled 2013-06-07: qty 2

## 2013-06-07 MED ORDER — LIDOCAINE HCL (CARDIAC) 20 MG/ML IV SOLN
INTRAVENOUS | Status: AC
  Filled 2013-06-07: qty 5

## 2013-06-07 MED ORDER — PROPOFOL 10 MG/ML IV EMUL
INTRAVENOUS | Status: AC
  Filled 2013-06-07: qty 20

## 2013-06-07 MED ORDER — LIDOCAINE HCL 1 % IJ SOLN
INTRAMUSCULAR | Status: AC
  Filled 2013-06-07: qty 20

## 2013-06-07 MED ORDER — FENTANYL CITRATE 0.05 MG/ML IJ SOLN
INTRAMUSCULAR | Status: AC
  Filled 2013-06-07: qty 2

## 2013-06-07 MED ORDER — SODIUM CHLORIDE 0.9 % IR SOLN
Status: DC | PRN
  Administered 2013-06-07: 2

## 2013-06-07 MED ORDER — LIDOCAINE HCL (CARDIAC) 20 MG/ML IV SOLN
INTRAVENOUS | Status: DC | PRN
  Administered 2013-06-07: 60 mg via INTRAVENOUS

## 2013-06-07 MED ORDER — NALBUPHINE HCL 10 MG/ML IJ SOLN
INTRAMUSCULAR | Status: AC
  Administered 2013-06-07: 5 mg via INTRAVENOUS
  Filled 2013-06-07: qty 1

## 2013-06-07 MED ORDER — FENTANYL CITRATE 0.05 MG/ML IJ SOLN
INTRAMUSCULAR | Status: AC
  Filled 2013-06-07: qty 5

## 2013-06-07 MED ORDER — LACTATED RINGERS IV SOLN
INTRAVENOUS | Status: DC
  Administered 2013-06-07 (×3): via INTRAVENOUS

## 2013-06-07 MED ORDER — NALBUPHINE HCL 10 MG/ML IJ SOLN
10.0000 mg | INTRAMUSCULAR | Status: DC | PRN
  Administered 2013-06-07: 5 mg via INTRAVENOUS

## 2013-06-07 MED ORDER — PROPOFOL 10 MG/ML IV BOLUS
INTRAVENOUS | Status: DC | PRN
  Administered 2013-06-07: 50 mg via INTRAVENOUS
  Administered 2013-06-07: 150 mg via INTRAVENOUS

## 2013-06-07 MED ORDER — MIDAZOLAM HCL 2 MG/2ML IJ SOLN
INTRAMUSCULAR | Status: AC
  Filled 2013-06-07: qty 2

## 2013-06-07 SURGICAL SUPPLY — 16 items
CATH ROBINSON RED A/P 16FR (CATHETERS) ×3 IMPLANT
CLOTH BEACON ORANGE TIMEOUT ST (SAFETY) ×3 IMPLANT
CONTAINER PREFILL 10% NBF 60ML (FORM) ×6 IMPLANT
DRAPE HYSTEROSCOPY (DRAPE) ×3 IMPLANT
DRSG TELFA 3X8 NADH (GAUZE/BANDAGES/DRESSINGS) ×3 IMPLANT
GLOVE BIO SURGEON STRL SZ8.5 (GLOVE) ×3 IMPLANT
GOWN STRL REIN XL XLG (GOWN DISPOSABLE) ×6 IMPLANT
GOWN STRL REUS W/TWL 2XL LVL3 (GOWN DISPOSABLE) ×3 IMPLANT
NEEDLE SPNL 22GX3.5 QUINCKE BK (NEEDLE) ×3 IMPLANT
PACK VAGINAL MINOR WOMEN LF (CUSTOM PROCEDURE TRAY) ×3 IMPLANT
PAD OB MATERNITY 4.3X12.25 (PERSONAL CARE ITEMS) ×3 IMPLANT
SET BERKELEY SUCTION TUBING (SUCTIONS) ×3 IMPLANT
SET GENESYS HTA PROCERVA (MISCELLANEOUS) ×6 IMPLANT
SYR CONTROL 10ML LL (SYRINGE) ×3 IMPLANT
TOWEL OR 17X24 6PK STRL BLUE (TOWEL DISPOSABLE) ×6 IMPLANT
VACURETTE 7MM CVD STRL WRAP (CANNULA) ×3 IMPLANT

## 2013-06-07 NOTE — Op Note (Signed)
preop diagnosis myoma uteri DOB Postop diagnosis hysteroscopy D&C an attempt at HTA blood that procedure failed Anesthesia general Procedure on the general anesthesia patient in lithotomy position perineum and vagina prepped and draped data entered with a straight catheter bimanual exam revealed the uterus to be 14 week size irregular with myomas speculum placed in the vagina cervix injected with 10 cc 1% Xylocaine anterior lip of the cervix grasped with tenaculum endometrial cavity sounded 12 cm irregular cervix dilated 25 Pratt and the hysteroscope inserted revealing large amount of clots and him myomas hysteroscope removed and a sharp curettage performed a large amount of blood obtained and tissue the HTA device inserted and after 3 attempts of not letting the procedure to go followed the procedure was terminated   and so HTA was not done

## 2013-06-07 NOTE — Transfer of Care (Signed)
Immediate Anesthesia Transfer of Care Note  Patient: [name]  Procedure(s) Performed: Procedure(s): DILATATION & CURETTAGE/HYSTEROSCOPY WITH attempted HYDROTHERMAL ABLATION (N/A)  Patient Location: PACU  Anesthesia Type:General  Level of Consciousness: awake, alert  and oriented  Airway & Oxygen Therapy: Patient Spontanous Breathing  Post-op Assessment: Report given to PACU RN and Post -op Vital signs reviewed and stable  Post vital signs: Reviewed and stable  Complications: No apparent anesthesia complications

## 2013-06-07 NOTE — Anesthesia Preprocedure Evaluation (Addendum)
Anesthesia Evaluation  Patient identified by MRN, date of birth, ID band Patient awake    Reviewed: Allergy & Precautions, H&P , Patient's Chart, lab work & pertinent test results, reviewed documented beta blocker date and time   Airway Mallampati: II TM Distance: >3 FB Neck ROM: full    Dental no notable dental hx.    Pulmonary asthma (Bronchitis, has inhaler) ,  breath sounds clear to auscultation  Pulmonary exam normal       Cardiovascular hypertension, On Medications + Cardiac Defibrillator Rhythm:regular Rate:Normal     Neuro/Psych    GI/Hepatic GERD-  Medicated,  Endo/Other  diabetes, Type 2, Insulin Dependent  Renal/GU      Musculoskeletal   Abdominal   Peds  Hematology   Anesthesia Other Findings   Reproductive/Obstetrics                           Anesthesia Physical Anesthesia Plan  ASA: III  Anesthesia Plan:    Post-op Pain Management:    Induction: Intravenous  Airway Management Planned: LMA  Additional Equipment:   Intra-op Plan:   Post-operative Plan:   Informed Consent: I have reviewed the patients History and Physical, chart, labs and discussed the procedure including the risks, benefits and alternatives for the proposed anesthesia with the patient or authorized representative who has indicated his/her understanding and acceptance.   Dental Advisory Given and Dental advisory given  Plan Discussed with: CRNA and Surgeon  Anesthesia Plan Comments: (Discussed GA with LMA, possible sore throat, potential need to switch to ETT, N/V, pulmonary aspiration. Questions answered. )       Anesthesia Quick Evaluation

## 2013-06-07 NOTE — H&P (Signed)
  There has been no change in the history and physical since her original dictation in

## 2013-06-07 NOTE — Discharge Instructions (Signed)
D&C Hysterosocpy Care After Read the instructions below. Refer to this sheet in the next few weeks. These instructions provide you with general information on caring for yourself after you leave the hospital. Your caregiver may also give you specific instructions.  D&C or vacuum curettage is a minor operation. A D&C involves the stretching (dilatation) of the cervix and scraping (curettage) of the inside lining of the uterus. A vacuum curettage gently sucks out the lining and tissue in the uterus with a tube. You may have light cramping and bleeding for a couple of days to two weeks after the procedure. This procedure may be done in a hospital, outpatient clinic, or doctor's office. You may be given a drug to make you sleep (general anesthetic) or a drug that numbs the area (local anesthetic) in and around the cervix. HOME CARE INSTRUCTIONS  Do not drive for 24 hours.   Wait one week before returning to strenuous activities.   Take your temperature two times a day for 4 days and write it down. Provide these temperatures to your caregiver if they are abnormal (above 98.6 F or 37.0 C).   Avoid long periods of standing, and do no heavy lifting (more than 10 pounds), pushing or pulling.   Limit stair climbing to once or twice a day.   Take rest periods often.   You may resume your usual diet.   Drink plenty of fluids (6-8 glasses a day).   You should return to your usual bowel function. If constipation should occur, you may:   Take a mild laxative with permission from your caregiver.   Add fruit and bran to your diet.   Drink more fluids. This helps with constipation.   Take showers instead of baths until your caregiver gives you permission to take baths.   Do not go swimming or use a hot tub until your caregiver gives you permission.   Try to have someone with you or available for you the first 24 to 48 hours, especially if you had a general anesthetic.   Do not douche, use  tampons, or have intercourse until after your follow-up appointment, or when your caregiver approves.   Only take over-the-counter or prescription medicines for pain, discomfort, or fever as directed by your caregiver. Do not take aspirin. It can cause bleeding.   If a prescription was given, follow your caregiver's directions. You may be given a medicine that kills germs (antibiotic) to prevent an infection.   Keep all your follow-up appointments recommended by your caregiver.  SEEK MEDICAL CARE IF:  You have increasing cramps or pain not relieved with medication.   You develop belly (abdominal) pain which does not seem to be related to the same area of earlier cramping and pain.   You feel dizzy or feel like fainting.   You have bad smelling vaginal discharge.   You develop a rash.   You develop a reaction or allergy to your medication.  SEEK IMMEDIATE MEDICAL CARE IF:  Bleeding is heavier than a normal menstrual period.   You have an oral temperature above 100.6, not controlled by medicine.   You develop chest pain.   You develop shortness of breath.   You pass out.   You develop pain in your shoulder strap area.   You develop heavy vaginal bleeding with or without blood clots.  MAKE SURE YOU:   Understand these instructions.   Will watch your condition.   Will get help right away if you are  not doing well or get worse.  °UPDATED HEALTH PRACTICES °· A PAP smear is done to screen for cervical cancer.  °· The first PAP smear should be done at age 21.  °· Between ages 21 and 29, PAP smears are repeated every 2 years.  °· Beginning at age 30, you are advised to have a PAP smear every 3 years as long as your past 3 PAP smears have been normal.  °· Some women have medical problems that increase the chance of getting cervical cancer. Talk to your caregiver about these problems. It is especially important to talk to your caregiver if a new problem develops soon after your last PAP  smear. In these cases, your caregiver may recommend more frequent screening and Pap smears.  °· The above recommendations are the same for women who have or have not gotten the vaccine for HPV (Human Papillomavirus).  °· If you had a hysterectomy for a problem that was not a cancer or a condition that could lead to cancer, then you no longer need Pap smears.  °· If you are between ages 65 and 70, and you have had normal Pap smears going back 10 years, you no longer need Pap smears.  °· If you have had past treatment for cervical cancer or a condition that could lead to cancer, you need Pap smears and screening for cancer for at least 20 years after your treatment.  °· Continue monthly self-breast examinations. Your caregiver can provide information and instructions for self-breast examination.  °Document Released: 05/01/2000 Document Re-Released: 10/22/2009 °Exit[employer]® Patient Information ©2011 [employer].DISCHARGE INSTRUCTIONS: D&C / D&E °The following instructions have been prepared to help you care for yourself upon your return home. °  °Personal hygiene: °• Use sanitary pads for vaginal drainage, not tampons. °• Shower the day after your procedure. °• NO tub baths, pools or Jacuzzis for 2-3 weeks. °• Wipe front to back after using the bathroom. ° °Activity and limitations: °• Do NOT drive or operate any equipment for 24 hours. The effects of anesthesia are still present and drowsiness may result. °• Do NOT rest in bed all day. °• Walking is encouraged. °• Walk up and down stairs slowly. °• You may resume your normal activity in one to two days or as indicated by your physician. ° °Sexual activity: NO intercourse for at least 2 weeks after the procedure, or as indicated by your physician. ° °Diet: Eat a light meal as desired this evening. You may resume your usual diet tomorrow. ° °Return to work: You may resume your work activities in one to two days or as indicated by your doctor. ° °What to expect after your  surgery: Expect to have vaginal bleeding/discharge for 2-3 days and spotting for up to 10 days. It is not unusual to have soreness for up to 1-2 weeks. You may have a slight burning sensation when you urinate for the first day. Mild cramps may continue for a couple of days. You may have a regular period in 2-6 weeks. ° °Call your doctor for any of the following: °• Excessive vaginal bleeding, saturating and changing one pad every hour. °• Inability to urinate 6 hours after discharge from hospital. °• Pain not relieved by pain medication. °• Fever of 100.4° F or greater. °• Unusual vaginal discharge or odor. ° ° Call for an appointment:  ° ° °Patient’s signature: ______________________ ° °Nurse’s signature ________________________ ° °Support person's signature_______________________ ° ° ° °

## 2013-06-07 NOTE — Anesthesia Procedure Notes (Signed)
Procedure Name: LMA Insertion Date/Time: 06/07/2013 10:58 AM Performed by: Jany Buckwalter, Sheron Nightingale Pre-anesthesia Checklist: Patient identified, Patient being monitored, Emergency Drugs available, Timeout performed and Suction available Patient Re-evaluated:Patient Re-evaluated prior to inductionOxygen Delivery Method: Circle system utilized Preoxygenation: Pre-oxygenation with 100% oxygen Intubation Type: IV induction LMA: LMA inserted LMA Size: 4.0 Tube size: 4.0 mm Number of attempts: 1 Placement Confirmation: ETT inserted through vocal cords under direct vision,  positive ETCO2 and breath sounds checked- equal and bilateral ETT to lip (cm): at lip. Dental Injury: Teeth and Oropharynx as per pre-operative assessment

## 2013-06-08 ENCOUNTER — Encounter (HOSPITAL_COMMUNITY): Payer: Self-pay | Admitting: Obstetrics

## 2013-06-08 LAB — GLUCOSE, CAPILLARY: GLUCOSE-CAPILLARY: 247 mg/dL — AB (ref 70–99)

## 2013-06-08 NOTE — Anesthesia Postprocedure Evaluation (Signed)
  Anesthesia Post-op Note  Patient: [name]  Procedure(s) Performed: Procedure(s): DILATATION & CURETTAGE/HYSTEROSCOPY WITH attempted HYDROTHERMAL ABLATION (N/A) Patient is awake and responsive. Pain and nausea are reasonably well controlled. Vital signs are stable and clinically acceptable. Oxygen saturation is clinically acceptable. There are no apparent anesthetic complications at this time. Patient is ready for discharge.

## 2013-06-26 ENCOUNTER — Other Ambulatory Visit: Payer: Self-pay | Admitting: Obstetrics

## 2013-06-26 DIAGNOSIS — D259 Leiomyoma of uterus, unspecified: Secondary | ICD-10-CM

## 2013-07-06 ENCOUNTER — Ambulatory Visit
Admission: RE | Admit: 2013-07-06 | Discharge: 2013-07-06 | Disposition: A | Discharge status: Home / Self Care | Payer: Medicaid Other | Source: Ambulatory Visit | Attending: Obstetrics | Admitting: Obstetrics

## 2013-07-06 DIAGNOSIS — D259 Leiomyoma of uterus, unspecified: Secondary | ICD-10-CM

## 2013-07-17 ENCOUNTER — Other Ambulatory Visit: Payer: Self-pay | Admitting: Obstetrics

## 2013-07-26 ENCOUNTER — Ambulatory Visit (INDEPENDENT_AMBULATORY_CARE_PROVIDER_SITE_OTHER): Payer: Medicaid Other | Admitting: *Deleted

## 2013-07-26 DIAGNOSIS — I4581 Long QT syndrome: Secondary | ICD-10-CM

## 2013-07-26 LAB — MDC_IDC_ENUM_SESS_TYPE_INCLINIC
Battery Voltage: 2.89 V
Date Time Interrogation Session: 20150311132559
HIGH POWER IMPEDANCE MEASURED VALUE: 43 Ohm
HIGH POWER IMPEDANCE MEASURED VALUE: 44 Ohm
HIGH POWER IMPEDANCE MEASURED VALUE: 45 Ohm
HIGH POWER IMPEDANCE MEASURED VALUE: 47 Ohm
HIGH POWER IMPEDANCE MEASURED VALUE: 47 Ohm
HIGH POWER IMPEDANCE MEASURED VALUE: 47 Ohm
HIGH POWER IMPEDANCE MEASURED VALUE: 47 Ohm
HIGH POWER IMPEDANCE MEASURED VALUE: 56 Ohm
HIGH POWER IMPEDANCE MEASURED VALUE: 57 Ohm
HIGH POWER IMPEDANCE MEASURED VALUE: 60 Ohm
HIGH POWER IMPEDANCE MEASURED VALUE: 62 Ohm
HighPow Impedance: 43 Ohm
HighPow Impedance: 44 Ohm
HighPow Impedance: 44 Ohm
HighPow Impedance: 45 Ohm
HighPow Impedance: 45 Ohm
HighPow Impedance: 46 Ohm
HighPow Impedance: 47 Ohm
HighPow Impedance: 47 Ohm
HighPow Impedance: 48 Ohm
HighPow Impedance: 56 Ohm
HighPow Impedance: 57 Ohm
HighPow Impedance: 58 Ohm
HighPow Impedance: 58 Ohm
HighPow Impedance: 60 Ohm
HighPow Impedance: 60 Ohm
HighPow Impedance: 61 Ohm
HighPow Impedance: 62 Ohm
HighPow Impedance: 62 Ohm
HighPow Impedance: 62 Ohm
HighPow Impedance: 66 Ohm
Lead Channel Impedance Value: 424 Ohm
Lead Channel Impedance Value: 432 Ohm
Lead Channel Impedance Value: 440 Ohm
Lead Channel Impedance Value: 440 Ohm
Lead Channel Impedance Value: 448 Ohm
Lead Channel Impedance Value: 456 Ohm
Lead Channel Impedance Value: 456 Ohm
Lead Channel Impedance Value: 464 Ohm
Lead Channel Impedance Value: 472 Ohm
Lead Channel Impedance Value: 472 Ohm
Lead Channel Pacing Threshold Amplitude: 2.5 V
Lead Channel Pacing Threshold Pulse Width: 0.2 ms
Lead Channel Sensing Intrinsic Amplitude: 10.4 mV
Lead Channel Sensing Intrinsic Amplitude: 7.3 mV
Lead Channel Sensing Intrinsic Amplitude: 7.3 mV
Lead Channel Sensing Intrinsic Amplitude: 7.8 mV
Lead Channel Sensing Intrinsic Amplitude: 7.9 mV
Lead Channel Sensing Intrinsic Amplitude: 8.1 mV
Lead Channel Sensing Intrinsic Amplitude: 8.5 mV
Lead Channel Sensing Intrinsic Amplitude: 9 mV
Lead Channel Setting Sensing Sensitivity: 0.3 mV
MDC IDC MSMT LEADCHNL RV IMPEDANCE VALUE: 432 Ohm
MDC IDC MSMT LEADCHNL RV IMPEDANCE VALUE: 440 Ohm
MDC IDC MSMT LEADCHNL RV IMPEDANCE VALUE: 448 Ohm
MDC IDC MSMT LEADCHNL RV IMPEDANCE VALUE: 472 Ohm
MDC IDC MSMT LEADCHNL RV IMPEDANCE VALUE: 472 Ohm
MDC IDC MSMT LEADCHNL RV SENSING INTR AMPL: 10.5 mV
MDC IDC MSMT LEADCHNL RV SENSING INTR AMPL: 7.1 mV
MDC IDC MSMT LEADCHNL RV SENSING INTR AMPL: 7.4 mV
MDC IDC MSMT LEADCHNL RV SENSING INTR AMPL: 8.1 mV
MDC IDC MSMT LEADCHNL RV SENSING INTR AMPL: 8.4 mV
MDC IDC MSMT LEADCHNL RV SENSING INTR AMPL: 8.7 mV
MDC IDC MSMT LEADCHNL RV SENSING INTR AMPL: 9.8 mV
MDC IDC SET LEADCHNL RV PACING AMPLITUDE: 2.5 V
MDC IDC SET LEADCHNL RV PACING PULSEWIDTH: 0.9 ms
MDC IDC SET ZONE DETECTION INTERVAL: 300 ms
MDC IDC STAT BRADY RV PERCENT PACED: 0 %
Zone Setting Detection Interval: 250 ms
Zone Setting Detection Interval: 370 ms

## 2013-07-26 NOTE — Progress Notes (Signed)
ICD check in clinic. Normal device function. Thresholds and sensing consistent with previous device measurements. Impedance trends stable over time. 3 NST episodes---max dur. 14 bts, Max V 171--no EGMs (all on 12-18). Histogram distribution appropriate for patient and level of activity. No changes made this session. Device programmed at appropriate safety margins. Device programmed to optimize intrinsic conduction. Batt voltage 2.89V (ERI 2.62V). Plan to check device with GT in 3 months. Patient education completed including shock plan. Alert tones demonstrated for patient.

## 2013-07-27 ENCOUNTER — Encounter (HOSPITAL_COMMUNITY): Payer: Self-pay

## 2013-07-27 ENCOUNTER — Encounter (HOSPITAL_COMMUNITY)
Admission: RE | Admit: 2013-07-27 | Discharge: 2013-07-27 | Disposition: A | Discharge status: Home / Self Care | Payer: Medicaid Other | Source: Ambulatory Visit | Attending: Obstetrics | Admitting: Obstetrics

## 2013-07-27 DIAGNOSIS — Z01812 Encounter for preprocedural laboratory examination: Secondary | ICD-10-CM | POA: Insufficient documentation

## 2013-07-27 LAB — BASIC METABOLIC PANEL
BUN: 5 mg/dL — ABNORMAL LOW (ref 6–23)
CO2: 26 meq/L (ref 19–32)
Calcium: 9.8 mg/dL (ref 8.4–10.5)
Chloride: 98 mEq/L (ref 96–112)
Creatinine, Ser: 0.61 mg/dL (ref 0.50–1.10)
GFR calc Af Amer: >90 mL/min (ref >90)
GFR calc non Af Amer: >90 mL/min (ref >90)
Glucose, Bld: 352 mg/dL — ABNORMAL HIGH (ref 70–99)
POTASSIUM: 4.1 meq/L (ref 3.7–5.3)
SODIUM: 137 meq/L (ref 137–147)

## 2013-07-27 LAB — CBC
HCT: 41.8 % (ref 36.0–46.0)
HEMOGLOBIN: 14.8 g/dL (ref 12.0–15.0)
MCH: 30.2 pg (ref 26.0–34.0)
MCHC: 35.4 g/dL (ref 30.0–36.0)
MCV: 85.3 fL (ref 78.0–100.0)
Platelets: 268 10*3/uL (ref 150–400)
RBC: 4.9 MIL/uL (ref 3.87–5.11)
RDW: 12.4 % (ref 11.5–15.5)
WBC: 7.9 10*3/uL (ref 4.0–10.5)

## 2013-07-27 NOTE — Patient Instructions (Addendum)
Your procedure is scheduled on: Wednesday, August 02, 2013  Enter through the [employer] [facility] at: [place] up the phone at the desk and dial [phone].  Call this number if you have problems the morning of surgery: [phone].  Remember: Do NOT eat food: AFTER MIDNIGHT TUESDAY Do NOT drink clear liquids after: AFTER MIDNIGHT TUESDAY Take these medicines the morning of surgery with a SIP OF WATER: ATENOLOL, PRILOSEC, GABAPENTIN *DO NOT TAKE METFORMIN 24 HOURS BEFORE SURGERY *NOVOLOG TAKE FULL DOSE AT DINNER  LANTUS TAKE 1/2 DOSE AT BEDTIME  Do NOT wear jewelry (body piercing), make-up, or nail polish. Do NOT wear lotions, powders, or perfumes.  You may wear deoderant. Do NOT shave for 48 hours prior to surgery. Do NOT bring valuables to the hospital. Contacts, dentures, or bridgework may not be worn into surgery. Have a responsible adult drive you home and stay with you for 24 hours after your procedure

## 2013-07-27 NOTE — Pre-Procedure Instructions (Signed)
Dr. Rudean Curt made aware of patients increased glucose results 352.  Dr. Glennon Mac request the patient  follow ups up with her endocrinologist to get blood glucose under better control prior to surgery date.  I left message for pt to follow up with her dr. and informed her of the results of her lab work today.

## 2013-07-28 NOTE — H&P (Signed)
NAME:  SHAINE, MOUNT NO.:  [id number]  MEDICAL RECORD NO.:  [id number]  LOCATION:                                 FACILITY:  PHYSICIAN:  Frederico Hamman, M.D.DATE OF BIRTH:  11/21/1970  DATE OF ADMISSION:  08/02/2013 DATE OF DISCHARGE:                             HISTORY & PHYSICAL   HISTORY OF PRESENT ILLNESS:  The patient is a 43 year old female with a long medical history.  She has pacemaker and a defibrillator in the heart.  She has got diabetes.  She has got sarcoidosis.  She has got neuropathy, COPD, asthma for which she takes multiple medications.  She has a history of fibroid, and she also has an allergy to dye.  In January of this year, she underwent hysteroscopy, D and C, and HDA procedure which failed because of equipment malfunction, so she is now in for another attempt of the HDA.  She was also referred to Radiology for embolization of her uterine vessels, but because of her severe allergy to dye they deferred and would not do  the procedure, so she is here for a second attempt at [facility].  PAST MEDICAL HISTORY:  As discussed.  PAST SURGICAL HISTORY:  She had ablation of the endometrial cavity many years ago.  She also had a pacemaker placed.  SOCIAL HISTORY:  Negative.  SYSTEM REVIEW:  As discussed above.  PHYSICAL EXAMINATION:  GENERAL:  Well-developed female in no distress. HEENT:  Negative. LUNGS:  Clear to P and A. HEART:  Regular rhythm.  No murmurs, no gallops. BREASTS:  No masses. ABDOMEN:  Uterus 14 week size.  Pap negative. EXTREMITIES:  Negative.          ______________________________ Frederico Hamman, M.D.     BAM/MEDQ  D:  07/28/2013  T:  07/28/2013  Job:  [id number]

## 2013-08-02 ENCOUNTER — Encounter (HOSPITAL_COMMUNITY): Payer: Self-pay | Admitting: Anesthesiology

## 2013-08-02 ENCOUNTER — Ambulatory Visit (HOSPITAL_COMMUNITY): Admission: RE | Admit: 2013-08-02 | Payer: Medicaid Other | Source: Ambulatory Visit | Admitting: Obstetrics

## 2013-08-02 ENCOUNTER — Encounter (HOSPITAL_COMMUNITY): Admission: RE | Payer: Self-pay | Source: Ambulatory Visit

## 2013-08-02 SURGERY — DILATATION & CURETTAGE/HYSTEROSCOPY WITH HYDROTHERMAL ABLATION
Anesthesia: General

## 2013-08-02 MED ORDER — ONDANSETRON HCL 4 MG/2ML IJ SOLN
INTRAMUSCULAR | Status: AC
  Filled 2013-08-02: qty 2

## 2013-08-02 MED ORDER — PROPOFOL 10 MG/ML IV EMUL
INTRAVENOUS | Status: AC
  Filled 2013-08-02: qty 20

## 2013-08-02 MED ORDER — FENTANYL CITRATE 0.05 MG/ML IJ SOLN
INTRAMUSCULAR | Status: AC
  Filled 2013-08-02: qty 2

## 2013-08-02 MED ORDER — MIDAZOLAM HCL 2 MG/2ML IJ SOLN
INTRAMUSCULAR | Status: AC
  Filled 2013-08-02: qty 2

## 2013-08-02 MED ORDER — LIDOCAINE HCL (CARDIAC) 20 MG/ML IV SOLN
INTRAVENOUS | Status: AC
  Filled 2013-08-02: qty 5

## 2013-08-10 ENCOUNTER — Encounter: Payer: Self-pay | Admitting: Internal Medicine

## 2013-09-22 ENCOUNTER — Other Ambulatory Visit: Payer: Self-pay | Admitting: Critical Care Medicine

## 2013-09-22 NOTE — Telephone Encounter (Signed)
Last OV with PW: 11/23/11; asked to f/u in 4 months No pending appts.  lmomtcb on home and cell #s Will send rx with instructions to call for appt given the weekend.

## 2013-09-25 NOTE — Telephone Encounter (Signed)
lmomtcb for pt 

## 2013-09-26 NOTE — Telephone Encounter (Signed)
lmomtcb for pt 

## 2013-09-27 NOTE — Telephone Encounter (Signed)
lmomtcb for pt on home and cell #s Per triage protocol and bc msg has also been sent to pharm with rx, will sign off on msg.

## 2013-10-24 ENCOUNTER — Encounter: Payer: Self-pay | Admitting: Internal Medicine

## 2013-10-24 ENCOUNTER — Ambulatory Visit (INDEPENDENT_AMBULATORY_CARE_PROVIDER_SITE_OTHER): Payer: Medicaid Other | Admitting: Internal Medicine

## 2013-10-24 VITALS — BP 124/80 | HR 85 | Ht 67.0 in | Wt 190.0 lb

## 2013-10-24 DIAGNOSIS — I471 Supraventricular tachycardia: Secondary | ICD-10-CM

## 2013-10-24 DIAGNOSIS — I10 Essential (primary) hypertension: Secondary | ICD-10-CM

## 2013-10-24 DIAGNOSIS — I4581 Long QT syndrome: Secondary | ICD-10-CM

## 2013-10-24 DIAGNOSIS — Z95810 Presence of automatic (implantable) cardiac defibrillator: Secondary | ICD-10-CM

## 2013-10-24 LAB — MDC_IDC_ENUM_SESS_TYPE_INCLINIC
Brady Statistic RV Percent Paced: 0 %
HIGH POWER IMPEDANCE MEASURED VALUE: 41 Ohm
HIGH POWER IMPEDANCE MEASURED VALUE: 44 Ohm
HIGH POWER IMPEDANCE MEASURED VALUE: 46 Ohm
HIGH POWER IMPEDANCE MEASURED VALUE: 46 Ohm
HIGH POWER IMPEDANCE MEASURED VALUE: 47 Ohm
HIGH POWER IMPEDANCE MEASURED VALUE: 47 Ohm
HIGH POWER IMPEDANCE MEASURED VALUE: 57 Ohm
HIGH POWER IMPEDANCE MEASURED VALUE: 57 Ohm
HIGH POWER IMPEDANCE MEASURED VALUE: 58 Ohm
HIGH POWER IMPEDANCE MEASURED VALUE: 60 Ohm
HIGH POWER IMPEDANCE MEASURED VALUE: 62 Ohm
HighPow Impedance: 43 Ohm
HighPow Impedance: 43 Ohm
HighPow Impedance: 43 Ohm
HighPow Impedance: 43 Ohm
HighPow Impedance: 45 Ohm
HighPow Impedance: 47 Ohm
HighPow Impedance: 47 Ohm
HighPow Impedance: 47 Ohm
HighPow Impedance: 49 Ohm
HighPow Impedance: 50 Ohm
HighPow Impedance: 54 Ohm
HighPow Impedance: 56 Ohm
HighPow Impedance: 58 Ohm
HighPow Impedance: 60 Ohm
HighPow Impedance: 60 Ohm
HighPow Impedance: 60 Ohm
HighPow Impedance: 62 Ohm
HighPow Impedance: 64 Ohm
HighPow Impedance: 67 Ohm
HighPow Impedance: 67 Ohm
Lead Channel Impedance Value: 432 Ohm
Lead Channel Impedance Value: 440 Ohm
Lead Channel Impedance Value: 448 Ohm
Lead Channel Impedance Value: 448 Ohm
Lead Channel Impedance Value: 448 Ohm
Lead Channel Impedance Value: 456 Ohm
Lead Channel Impedance Value: 464 Ohm
Lead Channel Impedance Value: 472 Ohm
Lead Channel Pacing Threshold Amplitude: 1 V
Lead Channel Pacing Threshold Pulse Width: 0.6 ms
Lead Channel Sensing Intrinsic Amplitude: 6.8 mV
Lead Channel Sensing Intrinsic Amplitude: 7.6 mV
Lead Channel Sensing Intrinsic Amplitude: 7.9 mV
Lead Channel Sensing Intrinsic Amplitude: 8 mV
Lead Channel Sensing Intrinsic Amplitude: 8.1 mV
Lead Channel Sensing Intrinsic Amplitude: 8.7 mV
Lead Channel Sensing Intrinsic Amplitude: 9 mV
Lead Channel Sensing Intrinsic Amplitude: 9.2 mV
Lead Channel Setting Pacing Pulse Width: 0.6 ms
Lead Channel Setting Sensing Sensitivity: 0.3 mV
MDC IDC MSMT BATTERY VOLTAGE: 2.88 V
MDC IDC MSMT LEADCHNL RV IMPEDANCE VALUE: 424 Ohm
MDC IDC MSMT LEADCHNL RV IMPEDANCE VALUE: 432 Ohm
MDC IDC MSMT LEADCHNL RV IMPEDANCE VALUE: 432 Ohm
MDC IDC MSMT LEADCHNL RV IMPEDANCE VALUE: 440 Ohm
MDC IDC MSMT LEADCHNL RV IMPEDANCE VALUE: 448 Ohm
MDC IDC MSMT LEADCHNL RV IMPEDANCE VALUE: 456 Ohm
MDC IDC MSMT LEADCHNL RV IMPEDANCE VALUE: 464 Ohm
MDC IDC MSMT LEADCHNL RV SENSING INTR AMPL: 7.4 mV
MDC IDC MSMT LEADCHNL RV SENSING INTR AMPL: 7.4 mV
MDC IDC MSMT LEADCHNL RV SENSING INTR AMPL: 7.7 mV
MDC IDC MSMT LEADCHNL RV SENSING INTR AMPL: 8.5 mV
MDC IDC MSMT LEADCHNL RV SENSING INTR AMPL: 9.1 mV
MDC IDC MSMT LEADCHNL RV SENSING INTR AMPL: 9.6 mV
MDC IDC MSMT LEADCHNL RV SENSING INTR AMPL: 9.8 mV
MDC IDC SESS DTM: 20150609114134
MDC IDC SET LEADCHNL RV PACING AMPLITUDE: 2.5 V
MDC IDC SET ZONE DETECTION INTERVAL: 250 ms
Zone Setting Detection Interval: 300 ms
Zone Setting Detection Interval: 370 ms

## 2013-10-24 MED ORDER — BETAMETHASONE DIPROPIONATE 0.05 % EX CREA
TOPICAL_CREAM | Freq: Two times a day (BID) | CUTANEOUS | Status: DC
Start: 2013-10-24 — End: 2014-12-10

## 2013-10-24 NOTE — Assessment & Plan Note (Signed)
Her medtronic ICD is working normally. Will follow.

## 2013-10-24 NOTE — Patient Instructions (Addendum)
Your physician has recommended you make the following change in your medication:   1) START Betamethasone. Use as directed  Your physician recommends that you schedule a follow-up appointment in: 3 months with the device clinic  Your physician wants you to follow-up in: 1 year with Dr. Dennis Bast will receive a reminder letter in the mail two months in advance. If you don't receive a letter, please call our office to schedule the follow-up appointment.   Marland Kitchen

## 2013-10-24 NOTE — Progress Notes (Signed)
HPI [name] returns today for followup. She is a pleasant 43 year old woman with a history of long QT syndrome and a very strong family history of sudden cardiac death, status post ICD implantation. She also has a history of SVT and is status post catheter ablation. In the interim, she has been stable. Her only complaint today is that of trouble sleeping. She states that she has trouble getting to sleep and staying asleep. No syncope. Her daughter has recently come to medical attention when she presented pregnant with syncope and subsequently delivered and had recurrent syncope and was found to have episodes of PMVT. The family has known LQTS 2. No peripheral edema. No chest pain or shortness of breath. She has tolerated her beta blocker without trouble. She c/o pruritus around her ICD insertion. She also has chest wall pain under her left breast. Allergies  Allergen Reactions  . Contrast Media [Iodinated Diagnostic Agents] Anaphylaxis and Shortness Of Breath    Needed to be defibrillated   . Codeine Nausea And Vomiting    jittery  . Iohexol      Code: RASH, Desc: PT STATES SHE IS ALLERGIC TO IV CONTRAST 05/28/06/RM, Onset Date: [id number]   . Meperidine Hcl Nausea And Vomiting  . Morphine And Related Nausea And Vomiting     Current Outpatient Prescriptions  Medication Sig Dispense Refill  . atenolol (TENORMIN) 50 MG tablet Take 1 tablet (50 mg total) by mouth 2 (two) times daily.  90 tablet  3  . budesonide-formoterol (SYMBICORT) 160-4.5 MCG/ACT inhaler Inhale 2 puffs into the lungs 2 (two) times daily.        . cetirizine (ZYRTEC) 10 MG tablet Take 10 mg by mouth daily as needed.       . cyclobenzaprine (FLEXERIL) 10 MG tablet Take 10 mg by mouth as needed for muscle spasms.       . cycloSPORINE (RESTASIS) 0.05 % ophthalmic emulsion Place 1 drop into both eyes 2 (two) times daily.       . diclofenac sodium (VOLTAREN) 1 % GEL Apply topically 2 (two) times daily.       . fluticasone  (FLONASE) 50 MCG/ACT nasal spray Place 2 sprays into the nose daily.  16 g  6  . gabapentin (NEURONTIN) 100 MG capsule Take 100 mg by mouth daily.       Marland Kitchen glimepiride (AMARYL) 2 MG tablet Take 2 mg by mouth 2 (two) times daily at 8 am and 10 pm.       . insulin aspart (NOVOLOG) 100 UNIT/ML injection Inject 4-8 Units into the skin 3 (three) times daily before meals. Sliding scale      . insulin glargine (LANTUS) 100 UNIT/ML injection Inject 35 Units into the skin at bedtime.       . metFORMIN (GLUCOPHAGE) 500 MG tablet Take 500 mg by mouth daily with breakfast.      . Olopatadine HCl (PATADAY) 0.2 % SOLN Place 1 drop into both eyes 2 (two) times daily.       Marland Kitchen omeprazole (PRILOSEC) 20 MG capsule take 1 capsule by mouth twice a day  60 capsule  0  . betamethasone dipropionate (DIPROLENE) 0.05 % cream Apply topically 2 (two) times daily.  30 g  1   No current facility-administered medications for this visit.     Past Medical History  Diagnosis Date  . Long Q-T syndrome   . Palpitation   . Seizure disorder   . Abdominal pain, periumbilic   . Esophageal  stricture   . Sarcoidosis   . DM (diabetes mellitus)   . Allergic rhinitis   . Bronchitis   . Sarcoidosis   . GERD (gastroesophageal reflux disease)   . Asthma   . Fibromyalgia   . 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 Stroke     QUESTIONABLE TIA   . Neuropathy in diabetes   . Seizures     HISTORY OF TAKING KLONIPIN NOT RX AT THE MOMENT  . Cancer     Nodules in lungs pt believes were cancerous pt unsure  . Arthritis   . Internal hemorrhoids   . Eye hemorrhage     bilateral    ROS:   All systems reviewed and negative except as noted in the HPI.   Past Surgical History  Procedure Laterality Date  . Tubal ligation    . Hand surgery    . Svt ablation    . Automatic implantable cardiac defibrillator situ      ICD-Medtronic   Remote - Yes   . Hemorrhoid banding    . Dilitation &  currettage/hystroscopy with hydrothermal ablation N/A 06/07/2013    Procedure: DILATATION & CURETTAGE/HYSTEROSCOPY WITH attempted HYDROTHERMAL ABLATION;  Surgeon: Frederico Hamman, MD;  Location: [facility];  Service: Gynecology;  Laterality: N/A;     Family History  Problem Relation Age of Onset  . Cancer    . Diabetes       History   Social History  . Marital Status: Single    Spouse Name: N/A    Number of Children: N/A  . Years of Education: N/A   Occupational History  . Not on file.   Social History Main Topics  . Smoking status: Never Smoker   . Smokeless tobacco: Never Used  . Alcohol Use: Yes     Comment: occ.  . Drug Use: No  . Sexual Activity: Not on file   Other Topics Concern  . Not on file   Social History Narrative  . No narrative on file     BP 124/80  Pulse 85  Ht 5\' 7"  (1.702 m)  Wt 190 lb (86.183 kg)  BMI 29.75 kg/m2  Physical Exam:  Well appearing 43 year old woman, NAD HEENT: Unremarkable Neck:  7 cm JVD, no thyromegally Back:  No CVA tenderness Lungs:  Clear with no wheezes, rales, or rhonchi. HEART:  Regular rate rhythm, no murmurs, no rubs, no clicks Abd:  soft, positive bowel sounds, no organomegally, no rebound, no guarding Ext:  2 plus pulses, no edema, no cyanosis, no clubbing Skin:  No rashes no nodules Neuro:  CN II through XII intact, motor grossly intact  DEVICE  Normal device function.  See PaceArt for details.   Assess/Plan:

## 2013-10-24 NOTE — Assessment & Plan Note (Signed)
Her blood pressure is well controlled. Will follow.  

## 2013-10-24 NOTE — Assessment & Plan Note (Signed)
She is s/p ablation. She has had no recurrent SVT.

## 2013-11-02 ENCOUNTER — Encounter: Payer: Self-pay | Admitting: Internal Medicine

## 2013-12-21 ENCOUNTER — Other Ambulatory Visit: Payer: Self-pay | Admitting: Critical Care Medicine

## 2014-01-30 ENCOUNTER — Encounter: Payer: Self-pay | Admitting: Internal Medicine

## 2014-02-19 ENCOUNTER — Ambulatory Visit (INDEPENDENT_AMBULATORY_CARE_PROVIDER_SITE_OTHER): Payer: Medicaid Other | Admitting: *Deleted

## 2014-02-19 ENCOUNTER — Encounter: Payer: Self-pay | Admitting: Internal Medicine

## 2014-02-19 DIAGNOSIS — I4581 Long QT syndrome: Secondary | ICD-10-CM

## 2014-02-19 DIAGNOSIS — Z95810 Presence of automatic (implantable) cardiac defibrillator: Secondary | ICD-10-CM

## 2014-02-19 LAB — MDC_IDC_ENUM_SESS_TYPE_INCLINIC
Battery Voltage: 2.79 V
Brady Statistic RV Percent Paced: 0 %
Date Time Interrogation Session: 20151005160104
HIGH POWER IMPEDANCE MEASURED VALUE: 41 Ohm
HIGH POWER IMPEDANCE MEASURED VALUE: 42 Ohm
HIGH POWER IMPEDANCE MEASURED VALUE: 44 Ohm
HIGH POWER IMPEDANCE MEASURED VALUE: 46 Ohm
HIGH POWER IMPEDANCE MEASURED VALUE: 47 Ohm
HIGH POWER IMPEDANCE MEASURED VALUE: 47 Ohm
HIGH POWER IMPEDANCE MEASURED VALUE: 49 Ohm
HIGH POWER IMPEDANCE MEASURED VALUE: 59 Ohm
HIGH POWER IMPEDANCE MEASURED VALUE: 59 Ohm
HIGH POWER IMPEDANCE MEASURED VALUE: 62 Ohm
HIGH POWER IMPEDANCE MEASURED VALUE: 66 Ohm
HIGH POWER IMPEDANCE MEASURED VALUE: 69 Ohm
HighPow Impedance: 41 Ohm
HighPow Impedance: 41 Ohm
HighPow Impedance: 43 Ohm
HighPow Impedance: 43 Ohm
HighPow Impedance: 44 Ohm
HighPow Impedance: 44 Ohm
HighPow Impedance: 45 Ohm
HighPow Impedance: 51 Ohm
HighPow Impedance: 52 Ohm
HighPow Impedance: 55 Ohm
HighPow Impedance: 56 Ohm
HighPow Impedance: 56 Ohm
HighPow Impedance: 59 Ohm
HighPow Impedance: 60 Ohm
HighPow Impedance: 61 Ohm
HighPow Impedance: 62 Ohm
HighPow Impedance: 64 Ohm
HighPow Impedance: 66 Ohm
HighPow Impedance: 71 Ohm
Lead Channel Impedance Value: 416 Ohm
Lead Channel Impedance Value: 416 Ohm
Lead Channel Impedance Value: 440 Ohm
Lead Channel Impedance Value: 440 Ohm
Lead Channel Impedance Value: 448 Ohm
Lead Channel Impedance Value: 464 Ohm
Lead Channel Impedance Value: 480 Ohm
Lead Channel Impedance Value: 488 Ohm
Lead Channel Pacing Threshold Pulse Width: 0.6 ms
Lead Channel Sensing Intrinsic Amplitude: 6.9 mV
Lead Channel Sensing Intrinsic Amplitude: 7.2 mV
Lead Channel Sensing Intrinsic Amplitude: 8.5 mV
Lead Channel Sensing Intrinsic Amplitude: 8.8 mV
Lead Channel Sensing Intrinsic Amplitude: 9.6 mV
Lead Channel Setting Sensing Sensitivity: 0.3 mV
MDC IDC MSMT LEADCHNL RV IMPEDANCE VALUE: 416 Ohm
MDC IDC MSMT LEADCHNL RV IMPEDANCE VALUE: 424 Ohm
MDC IDC MSMT LEADCHNL RV IMPEDANCE VALUE: 432 Ohm
MDC IDC MSMT LEADCHNL RV IMPEDANCE VALUE: 432 Ohm
MDC IDC MSMT LEADCHNL RV IMPEDANCE VALUE: 440 Ohm
MDC IDC MSMT LEADCHNL RV IMPEDANCE VALUE: 448 Ohm
MDC IDC MSMT LEADCHNL RV IMPEDANCE VALUE: 464 Ohm
MDC IDC MSMT LEADCHNL RV PACING THRESHOLD AMPLITUDE: 1 V
MDC IDC MSMT LEADCHNL RV SENSING INTR AMPL: 10.1 mV
MDC IDC MSMT LEADCHNL RV SENSING INTR AMPL: 10.2 mV
MDC IDC MSMT LEADCHNL RV SENSING INTR AMPL: 6.4 mV
MDC IDC MSMT LEADCHNL RV SENSING INTR AMPL: 6.5 mV
MDC IDC MSMT LEADCHNL RV SENSING INTR AMPL: 7.4 mV
MDC IDC MSMT LEADCHNL RV SENSING INTR AMPL: 7.6 mV
MDC IDC MSMT LEADCHNL RV SENSING INTR AMPL: 7.9 mV
MDC IDC MSMT LEADCHNL RV SENSING INTR AMPL: 8 mV
MDC IDC MSMT LEADCHNL RV SENSING INTR AMPL: 8 mV
MDC IDC MSMT LEADCHNL RV SENSING INTR AMPL: 9.8 mV
MDC IDC SET LEADCHNL RV PACING AMPLITUDE: 2.5 V
MDC IDC SET LEADCHNL RV PACING PULSEWIDTH: 0.6 ms
MDC IDC SET ZONE DETECTION INTERVAL: 300 ms
Zone Setting Detection Interval: 250 ms
Zone Setting Detection Interval: 370 ms

## 2014-02-19 MED ORDER — ATENOLOL 50 MG PO TABS: 50.0000 mg | ORAL_TABLET | Freq: Two times a day (BID) | ORAL | Status: DC

## 2014-02-19 NOTE — Progress Notes (Signed)
ICD check in clinic. Normal device function. Threshold and sensing consistent with previous device measurements. Impedance trends stable over time. 1 VT, monitored only---13sec. 7 NSVT---longest 25 beats. Histogram distribution appropriate for patient and level of activity. No changes made this session. Device programmed at appropriate safety margins. Device programmed to optimize intrinsic conduction. Battery @2 .79V. ROV w/ device clinic 05/21/14 & w/ Dr. Lovena Le in 30mo.

## 2014-03-07 ENCOUNTER — Encounter (HOSPITAL_COMMUNITY): Payer: Self-pay | Admitting: Emergency Medicine

## 2014-03-07 ENCOUNTER — Emergency Department (HOSPITAL_COMMUNITY)
Admission: EM | Admit: 2014-03-07 | Discharge: 2014-03-07 | Disposition: A | Discharge status: Home / Self Care | Payer: Medicaid Other | Attending: Emergency Medicine | Admitting: Emergency Medicine

## 2014-03-07 DIAGNOSIS — Z79899 Other long term (current) drug therapy: Secondary | ICD-10-CM | POA: Insufficient documentation

## 2014-03-07 DIAGNOSIS — Z8673 Personal history of transient ischemic attack (TIA), and cerebral infarction without residual deficits: Secondary | ICD-10-CM | POA: Insufficient documentation

## 2014-03-07 DIAGNOSIS — Z95810 Presence of automatic (implantable) cardiac defibrillator: Secondary | ICD-10-CM | POA: Insufficient documentation

## 2014-03-07 DIAGNOSIS — Z8679 Personal history of other diseases of the circulatory system: Secondary | ICD-10-CM | POA: Insufficient documentation

## 2014-03-07 DIAGNOSIS — D849 Immunodeficiency, unspecified: Secondary | ICD-10-CM | POA: Insufficient documentation

## 2014-03-07 DIAGNOSIS — K219 Gastro-esophageal reflux disease without esophagitis: Secondary | ICD-10-CM | POA: Diagnosis not present

## 2014-03-07 DIAGNOSIS — M1990 Unspecified osteoarthritis, unspecified site: Secondary | ICD-10-CM | POA: Insufficient documentation

## 2014-03-07 DIAGNOSIS — Z794 Long term (current) use of insulin: Secondary | ICD-10-CM | POA: Diagnosis not present

## 2014-03-07 DIAGNOSIS — E1140 Type 2 diabetes mellitus with diabetic neuropathy, unspecified: Secondary | ICD-10-CM | POA: Diagnosis not present

## 2014-03-07 DIAGNOSIS — H9202 Otalgia, left ear: Secondary | ICD-10-CM | POA: Diagnosis not present

## 2014-03-07 DIAGNOSIS — J029 Acute pharyngitis, unspecified: Secondary | ICD-10-CM | POA: Insufficient documentation

## 2014-03-07 DIAGNOSIS — J45909 Unspecified asthma, uncomplicated: Secondary | ICD-10-CM | POA: Insufficient documentation

## 2014-03-07 DIAGNOSIS — Z85118 Personal history of other malignant neoplasm of bronchus and lung: Secondary | ICD-10-CM | POA: Diagnosis not present

## 2014-03-07 DIAGNOSIS — G40909 Epilepsy, unspecified, not intractable, without status epilepticus: Secondary | ICD-10-CM | POA: Diagnosis not present

## 2014-03-07 DIAGNOSIS — Z7951 Long term (current) use of inhaled steroids: Secondary | ICD-10-CM | POA: Diagnosis not present

## 2014-03-07 LAB — RAPID STREP SCREEN (MED CTR MEBANE ONLY): STREPTOCOCCUS, GROUP A SCREEN (DIRECT): NEGATIVE

## 2014-03-07 MED ORDER — IBUPROFEN 800 MG PO TABS: 800.0000 mg | ORAL_TABLET | Freq: Once | ORAL | Status: DC

## 2014-03-07 MED ORDER — HYDROCODONE-ACETAMINOPHEN 7.5-325 MG/15ML PO SOLN
10.0000 mL | Freq: Once | ORAL | Status: AC
  Administered 2014-03-07: 10 mL via ORAL
  Filled 2014-03-07: qty 15

## 2014-03-07 MED ORDER — HYDROCODONE-ACETAMINOPHEN 7.5-325 MG/15ML PO SOLN: 10.0000 mL | Freq: Four times a day (QID) | ORAL | Status: DC | PRN

## 2014-03-07 MED ORDER — IBUPROFEN 800 MG PO TABS
800.0000 mg | ORAL_TABLET | Freq: Three times a day (TID) | ORAL | Status: DC | PRN
Start: 2014-03-07 — End: 2014-10-03

## 2014-03-07 NOTE — ED Provider Notes (Signed)
Medical screening examination/treatment/procedure(s) were performed by non-physician practitioner and as supervising physician I was immediately available for consultation/collaboration.   EKG Interpretation None        Everlene Balls, MD 03/07/14 1538

## 2014-03-07 NOTE — ED Provider Notes (Signed)
CSN: [id number]     Arrival date & time 03/07/14  0353 History   First MD Initiated Contact with Patient 03/07/14 0601     Chief Complaint  Patient presents with  . Sore Throat     (Consider location/radiation/quality/duration/timing/severity/associated sxs/prior Treatment) The history is provided by the patient.   Patient presents with left sided sore throat and left ear pain that began last night.  Described as tugging.  The sore throat is starting to spread to the right side.  The ear is described as feeling like "something is trying to come out."  Denies fevers, cough, SOB, CP, rhinorrhea.  Has taken no medications for her symptoms.   Past Medical History  Diagnosis Date  . Long Q-T syndrome   . Palpitation   . Seizure disorder   . Abdominal pain, periumbilic   . Esophageal stricture   . Sarcoidosis   . DM (diabetes mellitus)   . Allergic rhinitis   . Bronchitis   . Sarcoidosis   . GERD (gastroesophageal reflux disease)   . Asthma   . Fibromyalgia   . 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 Stroke     QUESTIONABLE TIA   . Neuropathy in diabetes   . Seizures     HISTORY OF TAKING KLONIPIN NOT RX AT THE MOMENT  . Cancer     Nodules in lungs pt believes were cancerous pt unsure  . Arthritis   . Internal hemorrhoids   . Eye hemorrhage     bilateral   Past Surgical History  Procedure Laterality Date  . Tubal ligation    . Hand surgery    . Svt ablation    . Automatic implantable cardiac defibrillator situ      ICD-Medtronic   Remote - Yes   . Hemorrhoid banding    . Dilitation & currettage/hystroscopy with hydrothermal ablation N/A 06/07/2013    Procedure: DILATATION & CURETTAGE/HYSTEROSCOPY WITH attempted HYDROTHERMAL ABLATION;  Surgeon: Frederico Hamman, MD;  Location: [facility];  Service: Gynecology;  Laterality: N/A;   Family History  Problem Relation Age of Onset  . Cancer    . Diabetes     History  Substance Use Topics   . Smoking status: Never Smoker   . Smokeless tobacco: Never Used  . Alcohol Use: Yes     Comment: occ.   OB History   Grav Para Term Preterm Abortions TAB SAB Ect Mult Living                 Review of Systems  Constitutional: Negative for fever and chills.  HENT: Positive for ear pain and sore throat. Negative for dental problem, rhinorrhea, sinus pressure, sneezing, tinnitus and trouble swallowing.   Respiratory: Negative for cough and shortness of breath.   Cardiovascular: Negative for chest pain.  Musculoskeletal: Negative for myalgias and neck stiffness.  Allergic/Immunologic: Positive for immunocompromised state (diabetic).  Neurological: Negative for speech difficulty.  Hematological: Does not bruise/bleed easily.      Allergies  Contrast media; Codeine; Iohexol; Meperidine hcl; and Morphine and related  Home Medications   Prior to Admission medications   Medication Sig Start Date End Date Taking? Authorizing Provider  atenolol (TENORMIN) 50 MG tablet Take 1 tablet (50 mg total) by mouth 2 (two) times daily. 02/19/14  Yes Evans Lance, MD  betamethasone dipropionate (DIPROLENE) 0.05 % cream Apply topically 2 (two) times daily. 10/24/13  Yes Evans Lance, MD  budesonide-formoterol [facility]) 160-4.5 MCG/ACT  inhaler Inhale 2 puffs into the lungs 2 (two) times daily.     Yes Historical Provider, MD  cetirizine (ZYRTEC) 10 MG tablet Take 10 mg by mouth daily as needed for allergies.    Yes Historical Provider, MD  cyclobenzaprine (FLEXERIL) 10 MG tablet Take 10 mg by mouth 3 (three) times daily as needed for muscle spasms.    Yes Historical Provider, MD  cycloSPORINE (RESTASIS) 0.05 % ophthalmic emulsion Place 1 drop into both eyes 2 (two) times daily.    Yes Historical Provider, MD  diclofenac sodium (VOLTAREN) 1 % GEL Apply 2 g topically 2 (two) times daily as needed (pain).    Yes Historical Provider, MD  gabapentin (NEURONTIN) 100 MG capsule Take 100 mg by mouth daily.     Yes Historical Provider, MD  glimepiride (AMARYL) 2 MG tablet Take 2 mg by mouth 2 (two) times daily at 8 am and 10 pm.    Yes Historical Provider, MD  insulin aspart (NOVOLOG) 100 UNIT/ML injection Inject 4-8 Units into the skin 3 (three) times daily before meals. Sliding scale   Yes Historical Provider, MD  insulin glargine (LANTUS) 100 UNIT/ML injection Inject 35 Units into the skin at bedtime.    Yes Historical Provider, MD  metFORMIN (GLUCOPHAGE) 500 MG tablet Take 500 mg by mouth daily with breakfast.   Yes Historical Provider, MD  Olopatadine HCl (PATADAY) 0.2 % SOLN Place 1 drop into both eyes 2 (two) times daily.    Yes Historical Provider, MD  omeprazole (PRILOSEC) 20 MG capsule take 1 capsule by mouth twice a day 09/22/13  Yes Elsie Stain, MD   BP 143/93  Pulse 93  Temp(Src) 98.5 F (36.9 C) (Oral)  Resp 20  Ht 5\' 7"  (1.702 m)  Wt 190 lb (86.183 kg)  BMI 29.75 kg/m2  SpO2 99% Physical Exam  Nursing note and vitals reviewed. Constitutional: She appears well-developed and well-nourished. No distress.  HENT:  Head: Normocephalic and atraumatic.  Right Ear: Tympanic membrane and ear canal normal.  Left Ear: Tympanic membrane and ear canal normal.  Mouth/Throat: Uvula is midline. Mucous membranes are not dry. Normal dentition. No uvula swelling. Posterior oropharyngeal erythema present. No oropharyngeal exudate, posterior oropharyngeal edema or tonsillar abscesses.  Eyes: Conjunctivae are normal.  Neck: Normal range of motion. Neck supple. No tracheal deviation present.  Cardiovascular: Normal rate and regular rhythm.   Pulmonary/Chest: Effort normal and breath sounds normal. No stridor. No respiratory distress. She has no wheezes. She has no rales.  Lymphadenopathy:    She has cervical adenopathy.  Neurological: She is alert.  Skin: She is not diaphoretic.    ED Course  Procedures (including critical care time) Labs Review Labs Reviewed  RAPID STREP SCREEN   CULTURE, GROUP A STREP    Imaging Review No results found.   EKG Interpretation None      Regarding allergy to codeine Pt has taken vicodin and percocet in the past without side effect.    MDM   Final diagnoses:  Pharyngitis    Afebrile, nontoxic patient with < 1 day sore throat and ear pain.  Per centor criteria, will test and culture.  No empiric treatment.  Strep screen negative.  No e/o peritonsillar abscess.   D/C home with ibuprofen, lortab elixir.  PCP follow up.  Discussed result, findings, treatment, and follow up  with patient.  Pt given return precautions.  Pt verbalizes understanding and agrees with plan.  [place], PA-C 03/07/14 [phone]

## 2014-03-07 NOTE — ED Notes (Signed)
Pt. left sore throat/ swelling onset yesterday morning " hard to swallow" , denies fever or chills , no cough , airway intact / respirations unlabored.

## 2014-03-07 NOTE — Discharge Instructions (Signed)
Read the information below.  Use the prescribed medication as directed.  Please discuss all new medications with your pharmacist.  Do not take additional tylenol while taking the prescribed pain medication to avoid overdose.  You may return to the Emergency Department at any time for worsening condition or any new symptoms that concern you.  If there is any possibility that you might be pregnant, please let your health care provider know and discuss this with the pharmacist to ensure medication safety.  If you develop high fevers, difficulty swallowing or breathing, or you are unable to tolerate fluids by mouth, return to the ER immediately for a recheck.      Pharyngitis Pharyngitis is a sore throat (pharynx). There is redness, pain, and swelling of your throat. HOME CARE   Drink enough fluids to keep your pee (urine) clear or pale yellow.  Only take medicine as told by your doctor.  You may get sick again if you do not take medicine as told. Finish your medicines, even if you start to feel better.  Do not take aspirin.  Rest.  Rinse your mouth (gargle) with salt water ( tsp of salt per 1 qt of water) every 1-2 hours. This will help the pain.  If you are not at risk for choking, you can suck on hard candy or sore throat lozenges. GET HELP IF:  You have large, tender lumps on your neck.  You have a rash.  You cough up green, yellow-brown, or bloody spit. GET HELP RIGHT AWAY IF:   You have a stiff neck.  You drool or cannot swallow liquids.  You throw up (vomit) or are not able to keep medicine or liquids down.  You have very bad pain that does not go away with medicine.  You have problems breathing (not from a stuffy nose). MAKE SURE YOU:   Understand these instructions.  Will watch your condition.  Will get help right away if you are not doing well or get worse. Document Released: 10/21/2007 Document Revised: 02/22/2013 Document Reviewed: 01/09/2013 [facility] Patient  Information 2015 [place], [place]. This information is not intended to replace advice given to you by your health care provider. Make sure you discuss any questions you have with your health care provider.

## 2014-03-08 LAB — CULTURE, GROUP A STREP

## 2014-05-07 ENCOUNTER — Telehealth: Payer: Self-pay | Admitting: Gastroenterology

## 2014-05-07 NOTE — Telephone Encounter (Signed)
Patient was on Linzess in the past. She had a change of insurance. She does not have that anymore. She is dealing with constipation which has stopped responding to OTC meds. She agrees to an appointment for re-evaluation.

## 2014-05-09 ENCOUNTER — Ambulatory Visit (INDEPENDENT_AMBULATORY_CARE_PROVIDER_SITE_OTHER): Payer: Medicaid Other | Admitting: Physician Assistant

## 2014-05-09 ENCOUNTER — Encounter: Payer: Self-pay | Admitting: Physician Assistant

## 2014-05-09 ENCOUNTER — Telehealth: Payer: Self-pay | Admitting: *Deleted

## 2014-05-09 VITALS — BP 132/74 | HR 88 | Ht 67.0 in | Wt 181.0 lb

## 2014-05-09 DIAGNOSIS — K648 Other hemorrhoids: Secondary | ICD-10-CM

## 2014-05-09 DIAGNOSIS — K5900 Constipation, unspecified: Secondary | ICD-10-CM

## 2014-05-09 MED ORDER — LUBIPROSTONE 8 MCG PO CAPS: 8.0000 ug | ORAL_CAPSULE | Freq: Every day | ORAL | Status: DC

## 2014-05-09 MED ORDER — PRAMOXINE-HC 1-1 % EX CREA: TOPICAL_CREAM | Freq: Three times a day (TID) | CUTANEOUS | Status: DC

## 2014-05-09 NOTE — Telephone Encounter (Signed)
Patients cream Analpram is not covered by Medicaid. I called Medicaid at [phone]  I spoke with Lexine Baton at [facility] and the cream is not covered and there is no alternative cream that they will cover, patient will have to pay out of pocket if she wants it.  Patient notified that the cream is not covered and she can pay out of pocket if she wishes or she can try Prep- H over the counter for the hemorrhoids.  Patient stated that she will check on the price of the cream and if it is too expensive then she will get Prep-H.

## 2014-05-09 NOTE — Progress Notes (Signed)
Patient ID: [name], female   DOB: 1971/05/15, 43 y.o.   MRN: [id number]     History of Present Illness:     [name] is a delightful 43 year old female who has been followed here in the past by Dr. Deatra Ina for chronic constipation. She had used Amit Tis in the past with good relief and for some reason was changed to [place]. She does not know why she was changed to [place]. In any event, the [place] worked well for her as well but her insurance changed and she has not been able to obtain it for the past year. She has been skipping 2-3 days between bowel movements and then passing hard stools that she strains to move. She has tried multiple over-the-counter laxatives like milk of magnesia, Dulcolax, and Mira lax with little relief. Her appetite spin good her weight has been stable. She has a history of internal hemorrhoids and has been having some rectal itching. She asked that she not have a rectal exam today as she has her period. She has a new defibrillator as she has a history of PSVT and long QT syndrome.   Past Medical History  Diagnosis Date  . Long Q-T syndrome   . Palpitation   . Seizure disorder   . Abdominal pain, periumbilic   . Esophageal stricture   . Sarcoidosis   . DM (diabetes mellitus)   . Allergic rhinitis   . Bronchitis   . Sarcoidosis   . GERD (gastroesophageal reflux disease)   . Asthma   . Fibromyalgia   . 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 Stroke     QUESTIONABLE TIA   . Neuropathy in diabetes   . Seizures     HISTORY OF TAKING KLONIPIN NOT RX AT THE MOMENT  . Cancer     Nodules in lungs pt believes were cancerous pt unsure  . Arthritis   . Internal hemorrhoids   . Eye hemorrhage     bilateral    Past Surgical History  Procedure Laterality Date  . Tubal ligation    . Hand surgery    . Svt ablation    . Automatic implantable cardiac defibrillator situ      ICD-Medtronic   Remote - Yes   . Hemorrhoid  banding    . Dilitation & currettage/hystroscopy with hydrothermal ablation N/A 06/07/2013    Procedure: DILATATION & CURETTAGE/HYSTEROSCOPY WITH attempted HYDROTHERMAL ABLATION;  Surgeon: Frederico Hamman, MD;  Location: [facility];  Service: Gynecology;  Laterality: N/A;   Family History  Problem Relation Age of Onset  . Cancer    . Diabetes     History  Substance Use Topics  . Smoking status: Never Smoker   . Smokeless tobacco: Never Used  . Alcohol Use: Yes     Comment: occ.   Current Outpatient Prescriptions  Medication Sig Dispense Refill  . atenolol (TENORMIN) 50 MG tablet Take 1 tablet (50 mg total) by mouth 2 (two) times daily. 90 tablet 3  . betamethasone dipropionate (DIPROLENE) 0.05 % cream Apply topically 2 (two) times daily. 30 g 1  . budesonide-formoterol (SYMBICORT) 160-4.5 MCG/ACT inhaler Inhale 2 puffs into the lungs 2 (two) times daily.      . cetirizine (ZYRTEC) 10 MG tablet Take 10 mg by mouth daily as needed for allergies.     . cyclobenzaprine (FLEXERIL) 10 MG tablet Take 10 mg by mouth 3 (three) times daily as needed for muscle spasms.     Marland Kitchen  cycloSPORINE (RESTASIS) 0.05 % ophthalmic emulsion Place 1 drop into both eyes 2 (two) times daily.     . diclofenac sodium (VOLTAREN) 1 % GEL Apply 2 g topically 2 (two) times daily as needed (pain).     Marland Kitchen gabapentin (NEURONTIN) 100 MG capsule Take 100 mg by mouth daily.     Marland Kitchen glimepiride (AMARYL) 2 MG tablet Take 2 mg by mouth 2 (two) times daily at 8 am and 10 pm.     . HYDROcodone-acetaminophen (HYCET) 7.5-325 mg/15 ml solution Take 10 mLs by mouth 4 (four) times daily as needed for moderate pain or severe pain. 150 mL 0  . ibuprofen (ADVIL,MOTRIN) 800 MG tablet Take 1 tablet (800 mg total) by mouth every 8 (eight) hours as needed for mild pain or moderate pain. 15 tablet 0  . insulin aspart (NOVOLOG) 100 UNIT/ML injection Inject 4-8 Units into the skin 3 (three) times daily before meals. Sliding scale    . insulin glargine  (LANTUS) 100 UNIT/ML injection Inject 35 Units into the skin at bedtime.     . metFORMIN (GLUCOPHAGE) 500 MG tablet Take 500 mg by mouth daily with breakfast.    . Olopatadine HCl (PATADAY) 0.2 % SOLN Place 1 drop into both eyes 2 (two) times daily.     Marland Kitchen omeprazole (PRILOSEC) 20 MG capsule take 1 capsule by mouth twice a day 60 capsule 0  . lubiprostone (AMITIZA) 8 MCG capsule Take 1 capsule (8 mcg total) by mouth daily with breakfast. 30 capsule 2  . pramoxine-hydrocortisone (ANALPRAM HC) cream Apply topically 3 (three) times daily. 30 g 0   No current facility-administered medications for this visit.   Allergies  Allergen Reactions  . Contrast Media [Iodinated Diagnostic Agents] Anaphylaxis and Shortness Of Breath    Needed to be defibrillated   . Codeine Nausea And Vomiting    jittery  . Iohexol      Code: RASH, Desc: PT STATES SHE IS ALLERGIC TO IV CONTRAST 05/28/06/RM, Onset Date: [id number]   . Meperidine Hcl Nausea And Vomiting  . Morphine And Related Nausea And Vomiting      Review of Systems: Gen: Denies any fever, chills, sweats, anorexia, fatigue, weakness, malaise, weight loss, and sleep disorder CV: Denies chest pain, angina, palpitations, syncope, orthopnea, PND, peripheral edema, and claudication. Resp: Denies dyspnea at rest, dyspnea with exercise, cough, sputum, wheezing, coughing up blood, and pleurisy. GI: Denies vomiting blood, jaundice, and fecal incontinence.   Denies dysphagia or odynophagia. GU : Denies urinary burning, blood in urine, urinary frequency, urinary hesitancy, nocturnal urination, and urinary incontinence. MS: Denies joint pain, limitation of movement, and swelling, stiffness, low back pain, extremity pain. Denies muscle weakness, cramps, atrophy.  Derm: Denies rash, itching, dry skin, hives, moles, warts, or unhealing ulcers.  Psych: Denies depression, anxiety, memory loss, suicidal ideation, hallucinations, paranoia, and confusion. Heme: Denies  bruising, bleeding, and enlarged lymph nodes. Neuro:  Denies any headaches, dizziness, paresthesia Endo:  Denies any problems with DM, thyroid, adrenal    Physical Exam: General: Pleasant, well developed female in no acute distress Head: Normocephalic and atraumatic Eyes:  sclerae anicteric, conjunctiva pink  Ears: Normal auditory acuity Lungs: Clear throughout to auscultation Heart: Regular rate and rhythm Abdomen: Soft, non distended, non-tender. No masses, no hepatomegaly. Normal bowel sounds Rectal: deferred Musculoskeletal: Symmetrical with no gross deformities  Extremities: No edema  Neurological: Alert oriented x 4, grossly nonfocal Psychological:  Alert and cooperative. Normal mood and affect  Assessment and Recommendations:  43 year old  female with chronic constipation here for follow-up. She's been advised to increase fiber and water in her diet, and she will be given a trial of Amit Tis 8 g daily. She's been advised to contact us if she has no relief at which time we can try a different dose. For her hemorrhoids she will be given a trial of Analpram cream 3 times a day for 1 week. She will follow up in 2-3 months sooner as needed.   Kestrel Mis, Deloris Ping 05/09/2014,

## 2014-05-09 NOTE — Patient Instructions (Addendum)
Increase your fiber intake   Increase your water intake   We have sent the following medications to your pharmacy for you to pick up at your convenience: Amitiza 8 mcg, please take one tablet by mouth once daily with food Analpram Cream   You have a follow up visit scheduled with Dr. Deatra Ina for 06-29-2014 at 9:45 am Please call back if you are not feeling better to get a sooner appointment with an extender. ____________________________________________________________________________________________________________________________________________ Constipation Constipation is when a person has fewer than three bowel movements a week, has difficulty having a bowel movement, or has stools that are dry, hard, or larger than normal. As people grow older, constipation is more common. If you try to fix constipation with medicines that make you have a bowel movement (laxatives), the problem may get worse. Long-term laxative use may cause the muscles of the colon to become weak. A low-fiber diet, not taking in enough fluids, and taking certain medicines may make constipation worse.  CAUSES   Certain medicines, such as antidepressants, pain medicine, iron supplements, antacids, and water pills.   Certain diseases, such as diabetes, irritable bowel syndrome (IBS), thyroid disease, or depression.   Not drinking enough water.   Not eating enough fiber-rich foods.   Stress or travel.   Lack of physical activity or exercise.   Ignoring the urge to have a bowel movement.   Using laxatives too much.  SIGNS AND SYMPTOMS   Having fewer than three bowel movements a week.   Straining to have a bowel movement.   Having stools that are hard, dry, or larger than normal.   Feeling full or bloated.   Pain in the lower abdomen.   Not feeling relief after having a bowel movement.  DIAGNOSIS  Your health care provider will take a medical history and perform a physical exam. Further  testing may be done for severe constipation. Some tests may include:  A barium enema X-ray to examine your rectum, colon, and, sometimes, your small intestine.   A sigmoidoscopy to examine your lower colon.   A colonoscopy to examine your entire colon. TREATMENT  Treatment will depend on the severity of your constipation and what is causing it. Some dietary treatments include drinking more fluids and eating more fiber-rich foods. Lifestyle treatments may include regular exercise. If these diet and lifestyle recommendations do not help, your health care provider may recommend taking over-the-counter laxative medicines to help you have bowel movements. Prescription medicines may be prescribed if over-the-counter medicines do not work.  HOME CARE INSTRUCTIONS   Eat foods that have a lot of fiber, such as fruits, vegetables, whole grains, and beans.  Limit foods high in fat and processed sugars, such as french fries, hamburgers, cookies, candies, and soda.   A fiber supplement may be added to your diet if you cannot get enough fiber from foods.   Drink enough fluids to keep your urine clear or pale yellow.   Exercise regularly or as directed by your health care provider.   Go to the restroom when you have the urge to go. Do not hold it.   Only take over-the-counter or prescription medicines as directed by your health care provider. Do not take other medicines for constipation without talking to your health care provider first.  [place] IF:   You have bright red blood in your stool.   Your constipation lasts for more than 4 days or gets worse.   You have abdominal or rectal pain.  You have thin, pencil-like stools.   You have unexplained weight loss. MAKE SURE YOU:   Understand these instructions.  Will watch your condition.  Will get help right away if you are not doing well or get worse. Document Released: 01/31/2004 Document Revised: 05/09/2013  Document Reviewed: 02/13/2013 [facility] Patient Information 2015 [place], [place]. This information is not intended to replace advice given to you by your health care provider. Make sure you discuss any questions you have with your health care provider.

## 2014-05-10 NOTE — Progress Notes (Signed)
For constipation I usually give amitiza 20mcg bid

## 2014-05-14 ENCOUNTER — Telehealth: Payer: Self-pay | Admitting: *Deleted

## 2014-05-14 NOTE — Telephone Encounter (Signed)
Patient is requesting a refill on Betamethasone cream, please advise.

## 2014-05-14 NOTE — Telephone Encounter (Signed)
PA on Betamethasone cream required, will fax to [facility]. No refill is needed at this time.

## 2014-05-17 LAB — CYTOLOGY - PAP: Pap: NEGATIVE

## 2014-05-21 ENCOUNTER — Ambulatory Visit (INDEPENDENT_AMBULATORY_CARE_PROVIDER_SITE_OTHER): Payer: Medicaid Other | Admitting: *Deleted

## 2014-05-21 DIAGNOSIS — I4581 Long QT syndrome: Secondary | ICD-10-CM

## 2014-05-21 DIAGNOSIS — Z95810 Presence of automatic (implantable) cardiac defibrillator: Secondary | ICD-10-CM

## 2014-05-21 LAB — MDC_IDC_ENUM_SESS_TYPE_INCLINIC
Battery Voltage: 2.74 V
Brady Statistic RV Percent Paced: 0 %
HIGH POWER IMPEDANCE MEASURED VALUE: 46 Ohm
HIGH POWER IMPEDANCE MEASURED VALUE: 46 Ohm
HIGH POWER IMPEDANCE MEASURED VALUE: 47 Ohm
HIGH POWER IMPEDANCE MEASURED VALUE: 48 Ohm
HIGH POWER IMPEDANCE MEASURED VALUE: 48 Ohm
HIGH POWER IMPEDANCE MEASURED VALUE: 50 Ohm
HIGH POWER IMPEDANCE MEASURED VALUE: 53 Ohm
HIGH POWER IMPEDANCE MEASURED VALUE: 56 Ohm
HIGH POWER IMPEDANCE MEASURED VALUE: 59 Ohm
HIGH POWER IMPEDANCE MEASURED VALUE: 61 Ohm
HIGH POWER IMPEDANCE MEASURED VALUE: 62 Ohm
HIGH POWER IMPEDANCE MEASURED VALUE: 70 Ohm
HighPow Impedance: 42 Ohm
HighPow Impedance: 43 Ohm
HighPow Impedance: 43 Ohm
HighPow Impedance: 43 Ohm
HighPow Impedance: 46 Ohm
HighPow Impedance: 47 Ohm
HighPow Impedance: 48 Ohm
HighPow Impedance: 49 Ohm
HighPow Impedance: 49 Ohm
HighPow Impedance: 58 Ohm
HighPow Impedance: 61 Ohm
HighPow Impedance: 61 Ohm
HighPow Impedance: 63 Ohm
HighPow Impedance: 64 Ohm
HighPow Impedance: 64 Ohm
HighPow Impedance: 65 Ohm
HighPow Impedance: 66 Ohm
HighPow Impedance: 68 Ohm
HighPow Impedance: 71 Ohm
Lead Channel Impedance Value: 440 Ohm
Lead Channel Impedance Value: 440 Ohm
Lead Channel Impedance Value: 440 Ohm
Lead Channel Impedance Value: 440 Ohm
Lead Channel Impedance Value: 448 Ohm
Lead Channel Impedance Value: 456 Ohm
Lead Channel Impedance Value: 456 Ohm
Lead Channel Impedance Value: 464 Ohm
Lead Channel Impedance Value: 464 Ohm
Lead Channel Impedance Value: 480 Ohm
Lead Channel Pacing Threshold Amplitude: 1 V
Lead Channel Pacing Threshold Pulse Width: 0.6 ms
Lead Channel Sensing Intrinsic Amplitude: 4.8 mV
Lead Channel Sensing Intrinsic Amplitude: 5.6 mV
Lead Channel Sensing Intrinsic Amplitude: 5.7 mV
Lead Channel Sensing Intrinsic Amplitude: 5.9 mV
Lead Channel Sensing Intrinsic Amplitude: 6.4 mV
Lead Channel Sensing Intrinsic Amplitude: 7.1 mV
Lead Channel Sensing Intrinsic Amplitude: 7.2 mV
Lead Channel Sensing Intrinsic Amplitude: 8 mV
Lead Channel Sensing Intrinsic Amplitude: 8.7 mV
Lead Channel Sensing Intrinsic Amplitude: 9.5 mV
Lead Channel Setting Pacing Amplitude: 2.5 V
Lead Channel Setting Pacing Pulse Width: 0.6 ms
MDC IDC MSMT LEADCHNL RV IMPEDANCE VALUE: 408 Ohm
MDC IDC MSMT LEADCHNL RV IMPEDANCE VALUE: 432 Ohm
MDC IDC MSMT LEADCHNL RV IMPEDANCE VALUE: 456 Ohm
MDC IDC MSMT LEADCHNL RV IMPEDANCE VALUE: 464 Ohm
MDC IDC MSMT LEADCHNL RV IMPEDANCE VALUE: 472 Ohm
MDC IDC MSMT LEADCHNL RV SENSING INTR AMPL: 6.5 mV
MDC IDC MSMT LEADCHNL RV SENSING INTR AMPL: 7.4 mV
MDC IDC MSMT LEADCHNL RV SENSING INTR AMPL: 7.7 mV
MDC IDC MSMT LEADCHNL RV SENSING INTR AMPL: 8 mV
MDC IDC MSMT LEADCHNL RV SENSING INTR AMPL: 8.4 mV
MDC IDC SESS DTM: 20160104152632
MDC IDC SET LEADCHNL RV SENSING SENSITIVITY: 0.3 mV
MDC IDC SET ZONE DETECTION INTERVAL: 370 ms
Zone Setting Detection Interval: 250 ms
Zone Setting Detection Interval: 300 ms

## 2014-05-21 NOTE — Progress Notes (Signed)
ICD check in clinic. Normal device function. Thresholds and sensing consistent with previous device measurements. Impedance trends stable over time. No evidence of any ventricular arrhythmias. No mode switches. Histogram distribution appropriate for patient and level of activity. No changes made this session. Device programmed at appropriate safety margins. Device programmed to optimize intrinsic conduction. Battery voltage 2.74 V. ROV in 3 mths w/device clinic and June with [facility].

## 2014-06-13 ENCOUNTER — Encounter: Payer: Self-pay | Admitting: Internal Medicine

## 2014-06-28 ENCOUNTER — Telehealth: Payer: Self-pay | Admitting: Critical Care Medicine

## 2014-06-28 NOTE — Telephone Encounter (Signed)
Spoke with the pt  She is c/o increased cough  OV with MW for tomorrow at 1:30 pm She was advised to bring all meds

## 2014-06-29 ENCOUNTER — Encounter: Payer: Self-pay | Admitting: Internal Medicine

## 2014-06-29 ENCOUNTER — Ambulatory Visit: Payer: Medicaid Other | Admitting: Gastroenterology

## 2014-06-29 ENCOUNTER — Ambulatory Visit (INDEPENDENT_AMBULATORY_CARE_PROVIDER_SITE_OTHER): Payer: Medicaid Other | Admitting: Internal Medicine

## 2014-06-29 ENCOUNTER — Ambulatory Visit (INDEPENDENT_AMBULATORY_CARE_PROVIDER_SITE_OTHER)
Admission: RE | Admit: 2014-06-29 | Discharge: 2014-06-29 | Disposition: A | Discharge status: Home / Self Care | Payer: Medicaid Other | Source: Ambulatory Visit | Attending: Internal Medicine | Admitting: Internal Medicine

## 2014-06-29 VITALS — BP 138/72 | HR 92 | Ht 67.0 in | Wt 177.2 lb

## 2014-06-29 DIAGNOSIS — R059 Cough, unspecified: Secondary | ICD-10-CM

## 2014-06-29 DIAGNOSIS — D869 Sarcoidosis, unspecified: Secondary | ICD-10-CM

## 2014-06-29 DIAGNOSIS — R05 Cough: Secondary | ICD-10-CM

## 2014-06-29 MED ORDER — HYDROCODONE-ACETAMINOPHEN 7.5-325 MG/15ML PO SOLN: 10.0000 mL | Freq: Four times a day (QID) | ORAL | Status: DC | PRN

## 2014-06-29 MED ORDER — GABAPENTIN 100 MG PO CAPS: 100.0000 mg | ORAL_CAPSULE | Freq: Three times a day (TID) | ORAL | Status: DC

## 2014-06-29 MED ORDER — AZITHROMYCIN 250 MG PO TABS: ORAL_TABLET | ORAL | Status: DC

## 2014-06-29 MED ORDER — PREDNISONE 10 MG PO TABS: ORAL_TABLET | ORAL | Status: DC

## 2014-06-29 NOTE — Progress Notes (Signed)
  Subjective:    Patient ID: [name], female    DOB: 03/07/1971   MRN: [id number]     Brief patient profile:  This is a 44 y.o. , African-American female with history of sarcoidosis and associated asthmatic bronchitis. The patient also had chronic chest pain has been thoroughly evaluated by cardiology. She does have a history of long QT syndrome and has an ICD placed in 2006.    11/17/2011 ov Now coughing more for two days.  Cough is prod of white mucus .  No wheeze.  Pt denies dyspnea changes.  Tessalon helps with cough rec Cyclic cough protocol with delsym, tramadol Rx allergic rhinitis with flonase Cont symbicort   06/29/2014  Acute  ov/Lenoir Facchini re: recurrent cough  Chief Complaint  Patient presents with  . Acute Visit    coughing up green mucus; runny nose; chest congestion; chest tightness; sinus pressure  acutely  ill x one week tried delsym and cough drops no better> does have tendency to cyclical cough and takes neurontin but never more than 100 mg daily  Sob no more than usual  No obvious day to day or daytime variabilty or assoc overt hb symptoms. No unusual exp hx or h/o childhood pna/ asthma or knowledge of premature birth.  Sleeping ok without nocturnal  or early am exacerbation  of respiratory  c/o's or need for noct saba. Also denies any obvious fluctuation of symptoms with weather or environmental changes or other aggravating or alleviating factors except as outlined above   Current Medications, Allergies, Complete Past Medical History, Past Surgical History, Family History, and Social History were reviewed in [facility] record.  ROS  The following are not active complaints unless bolded sore throat, dysphagia, dental problems, itching, sneezing,  nasal congestion or excess/ purulent secretions, ear ache,   fever, chills, sweats, unintended wt loss, pleuritic or exertional cp, hemoptysis,  orthopnea pnd or leg swelling, presyncope,  palpitations, heartburn, abdominal pain, anorexia, nausea, vomiting, diarrhea  or change in bowel or urinary habits, change in stools or urine, dysuria,hematuria,  rash, arthralgias, visual complaints, headache, numbness weakness or ataxia or problems with walking or coordination,  change in mood/affect or memory.             Objective:   Physical Exam   Wt Readings from Last 3 Encounters:  06/29/14 177 lb 3.2 oz (80.377 kg)  05/09/14 181 lb (82.101 kg)  03/07/14 190 lb (86.183 kg)    Vital signs reviewed    Gen: Pleasant, well-nourished, in no distress,  normal affect  ENT: No lesions,  mouth clear,  oropharynx clear, no postnasal drip  Neck: No JVD, no TMG, no carotid bruits  Lungs: No use of accessory muscles, no dullness to percussion, no wheezing   Cardiovascular: RRR, heart sounds normal, no murmur or gallops, no peripheral edema  Abdomen: soft and NT, no HSM,  BS normal  Musculoskeletal: No deformities, no cyanosis or clubbing  Neuro: alert, non focal  Skin: Warm, no lesions or rashes   CXR:  06/29/14 I personally reviewed images and agree with radiology impression as follows:   Stable position of the left cardiac ICD. Heart size is normal. Both lungs are clear without airspace disease or edema. The trachea is midline. Negative for pleural effusions. Bony structures are unremarkable.     Assessment & Plan:

## 2014-06-29 NOTE — Patient Instructions (Addendum)
Prednisone 10 mg take  4 each am x 2 days,   2 each am x 2 days,  1 each am x 2 days and stop   zpak  Increase gabapentin 100 three times a day   Try prilosec 20mg   Take 30- 60 min before your first and last meals of the day whenever having any respiratory symptoms  GERD (REFLUX)  is an extremely common cause of respiratory symptoms just like yours , many times with no obvious heartburn at all.    It can be treated with medication, but also with lifestyle changes including avoidance of late meals, excessive alcohol, smoking cessation, and avoid fatty foods, chocolate, peppermint, colas, red wine, and acidic juices such as orange juice.  NO MINT OR MENTHOL PRODUCTS SO NO COUGH DROPS  USE SUGARLESS CANDY INSTEAD (Jolley ranchers or Stover's or Life Savers) or even ice chips will also do - the key is to swallow to prevent all throat clearing. NO OIL BASED VITAMINS - use powdered substitutes.    Please remember to go to the  x-ray department downstairs for your tests - we will call you with the results when they are available.

## 2014-06-30 ENCOUNTER — Encounter: Payer: Self-pay | Admitting: Internal Medicine

## 2014-06-30 NOTE — Assessment & Plan Note (Signed)
cxr reviewed/ hx and exam and xray not c/w active sarcoid so f/u can be prn

## 2014-06-30 NOTE — Assessment & Plan Note (Signed)
Acute exac s evidence of active sarcoid in a pt with tendency to cyclical coughing    Explained the natural history of uri and why it's necessary in patients at risk to treat GERD aggressively - at least  short term -   to reduce risk of evolving cyclical cough initially  triggered by epithelial injury and a heightened sensitivty to the effects of any upper airway irritants,  most importantly acid - related - then perpetuated by epithelial injury related to the cough itself as the upper airway collapses on itself.  That is, the more sensitive the epithelium becomes once it is damaged by the virus, the more the ensuing irritability> the more the cough, the more the secondary reflux (especially in those prone to reflux) the more the irritation of the sensitive mucosa and so on in a  Classic cyclical pattern.    rx zpak/ prednisone and f/u prn and increase neurontin up to a 100 tid for now  F/u with Dr Joya Gaskins prn

## 2014-07-10 ENCOUNTER — Telehealth: Payer: Self-pay | Admitting: Critical Care Medicine

## 2014-07-10 NOTE — Telephone Encounter (Signed)
If any purulent secretions ok to Augmentin 875 mg take one pill twice daily  X 10 days - take at breakfast and supper with large glass of water.  It would help reduce the usual side effects (diarrhea and yeast infections) if you ate cultured yogurt at lunch.   If not, there's really nothing else to offer s ov noting she's allergic to every category of narcotic that would be in any cough meds stronger  than delsym

## 2014-07-10 NOTE — Telephone Encounter (Signed)
Spoke with the pt  She states that she is not coughing up purulent sputum at all  She states that she will come in for ov when she can

## 2014-07-10 NOTE — Telephone Encounter (Signed)
Spoke with pt. States that she is not doing any better. Cough has increased and is producing clear mucus. Has finished Zpack and Prednisone. SOB is present when coughing.  MW - please advise. Thanks.

## 2014-07-10 NOTE — Telephone Encounter (Signed)
Spoke with pt and she states that she has had a family emergency and her father is in ICU and she will not have anyone to bring her to appt.  Please advise of any other recommendations.

## 2014-07-10 NOTE — Telephone Encounter (Signed)
Ov asap with all meds in hand to see Tammy Np or me

## 2014-07-18 ENCOUNTER — Telehealth: Payer: Self-pay | Admitting: Critical Care Medicine

## 2014-07-18 DIAGNOSIS — R05 Cough: Secondary | ICD-10-CM

## 2014-07-18 DIAGNOSIS — R059 Cough, unspecified: Secondary | ICD-10-CM

## 2014-07-18 MED ORDER — AMOXICILLIN-POT CLAVULANATE 875-125 MG PO TABS: 1.0000 | ORAL_TABLET | Freq: Two times a day (BID) | ORAL | Status: DC

## 2014-07-18 MED ORDER — PREDNISONE 10 MG PO TABS: ORAL_TABLET | ORAL | Status: DC

## 2014-07-18 NOTE — Telephone Encounter (Signed)
Pt is aware that we will send in medication. Nothing further was needed.

## 2014-07-18 NOTE — Telephone Encounter (Signed)
Call in augmentin 875mg  bid x 5 days Prednisone 10mg  Take 4 for two days three for two days two for two days one for two days #20 Keep 07/20/14 appt

## 2014-07-18 NOTE — Telephone Encounter (Signed)
Spoke with pt, c/o prod cough with some green mucus since yesterday.  Pt states she's having no increased sob, fever.  Pt is taking mucinex "that you put in your drink" since yesterday with no help.  Has no used any sinus or nasal sprays.  Pt is requesting recs.  Has an appt on 3/4 with TP. Uses [facility] on [address].    PW please advise.  Thanks!

## 2014-07-20 ENCOUNTER — Encounter: Payer: Self-pay | Admitting: Adult Health

## 2014-07-20 ENCOUNTER — Ambulatory Visit (INDEPENDENT_AMBULATORY_CARE_PROVIDER_SITE_OTHER): Payer: Medicaid Other | Admitting: Adult Health

## 2014-07-20 VITALS — BP 112/78 | HR 98 | Temp 97.9°F | Ht 67.0 in | Wt 171.8 lb

## 2014-07-20 DIAGNOSIS — R059 Cough, unspecified: Secondary | ICD-10-CM

## 2014-07-20 DIAGNOSIS — J4489 Other specified chronic obstructive pulmonary disease: Secondary | ICD-10-CM

## 2014-07-20 DIAGNOSIS — J449 Chronic obstructive pulmonary disease, unspecified: Secondary | ICD-10-CM

## 2014-07-20 DIAGNOSIS — R05 Cough: Secondary | ICD-10-CM

## 2014-07-20 MED ORDER — TRAMADOL HCL 50 MG PO TABS: 50.0000 mg | ORAL_TABLET | Freq: Four times a day (QID) | ORAL | Status: DC | PRN

## 2014-07-20 MED ORDER — BENZONATATE 200 MG PO CAPS: 200.0000 mg | ORAL_CAPSULE | Freq: Three times a day (TID) | ORAL | Status: DC | PRN

## 2014-07-20 NOTE — Patient Instructions (Signed)
Delsym 2 tsp Twice daily  As needed  For cough  Tessalon Three times a day  As needed for cough  Tramadol 50mg  daily every 6hr as needed for cough , may make you sleepy.,  Sips water to help soothe. NO MINTS  Follow up Dr. Joya Gaskins  In 6 weeks with PFT  Please contact office for sooner follow up if symptoms do not improve or worsen or seek emergency care

## 2014-07-24 NOTE — Assessment & Plan Note (Signed)
Slow to resolve flare   Plan  Delsym 2 tsp Twice daily  As needed  For cough  Tessalon Three times a day  As needed for cough  Tramadol 50mg  daily every 6hr as needed for cough , may make you sleepy.,  Sips water to help soothe. NO MINTS  Follow up Dr. Joya Gaskins  In 6 weeks with PFT  Please contact office for sooner follow up if symptoms do not improve or worsen or seek emergency care

## 2014-07-24 NOTE — Progress Notes (Signed)
  Subjective:    Patient ID: [name], female    DOB: 1970/06/20   MRN: [id number]     Brief patient profile:  This is a 44 y.o. , African-American female with history of sarcoidosis and associated asthmatic bronchitis. The patient also had chronic chest pain has been thoroughly evaluated by cardiology. She does have a history of long QT syndrome and has an ICD placed in 2006.    11/17/2011 ov Now coughing more for two days.  Cough is prod of white mucus .  No wheeze.  Pt denies dyspnea changes.  Tessalon helps with cough rec Cyclic cough protocol with delsym, tramadol Rx allergic rhinitis with flonase Cont symbicort   06/29/2014  Acute  ov/[place] re: recurrent cough  Chief Complaint  Patient presents with  . Acute Visit    coughing up green mucus; runny nose; chest congestion; chest tightness; sinus pressure  acutely  ill x one week tried delsym and cough drops no better> does have tendency to cyclical cough and takes neurontin but never more than 100 mg daily  Sob no more than usual >Prednisone taper, zpak, Increase gabapentin 100 three times a day  PPI Twice daily  , CXR nad   07/20/14 Acute OV : Chronic cough , sarcoid and asthma bronchitis  Returns with persistent cough .  Seen 1 month ago with cough , given zpack and pred taper.  CXR w/ no acute process , last office visit. She does feel some better from last visit. However, she continues to have a cough. Cough regimen with PPI and gabapentin recommended.  She is not using anything for cough . Cough is keeping her up .  She denies any chest pain, hemoptysis ,orthopnea, PND or leg swelling.      Current Medications, Allergies, Complete Past Medical History, Past Surgical History, Family History, and Social History were reviewed in [employer] record.  ROS  The following are not active complaints unless bolded sore throat, dysphagia, dental problems, itching, sneezing,    fever, chills, sweats,  unintended wt loss, pleuritic or exertional cp, hemoptysis,  orthopnea pnd or leg swelling, presyncope, palpitations, heartburn, abdominal pain, anorexia, nausea, vomiting, diarrhea  or change in bowel or urinary habits, change in stools or urine, dysuria,hematuria,  rash, arthralgias, visual complaints, headache, numbness weakness or ataxia or problems with walking or coordination,  change in mood/affect or memory.             Objective:   Physical Exam  Vital signs reviewed    Gen: Pleasant, well-nourished, in no distress,  normal affect  ENT: No lesions,  mouth clear,  oropharynx clear, no postnasal drip  Neck: No JVD, no TMG, no carotid bruits  Lungs: No use of accessory muscles, no dullness to percussion, no wheezing   Cardiovascular: RRR, heart sounds normal, no murmur or gallops, no peripheral edema  Abdomen: soft and NT, no HSM,  BS normal  Musculoskeletal: No deformities, no cyanosis or clubbing  Neuro: alert, non focal  Skin: Warm, no lesions or rashes   CXR:  06/29/14 I personally reviewed images and agree with radiology impression as follows:   Stable position of the left cardiac ICD. Heart size is normal. Both lungs are clear without airspace disease or edema. The trachea is midline. Negative for pleural effusions. Bony structures are unremarkable.     Assessment & Plan:

## 2014-07-24 NOTE — Assessment & Plan Note (Signed)
Slow to resolve   Plan  Delsym 2 tsp Twice daily  As needed  For cough  Tessalon Three times a day  As needed for cough  Tramadol 50mg  daily every 6hr as needed for cough , may make you sleepy.,  Sips water to help soothe. NO MINTS  Follow up Dr. Joya Gaskins  In 6 weeks with PFT  Please contact office for sooner follow up if symptoms do not improve or worsen or seek emergency care

## 2014-07-30 ENCOUNTER — Ambulatory Visit: Payer: Medicaid Other | Admitting: Critical Care Medicine

## 2014-08-14 ENCOUNTER — Telehealth: Payer: Self-pay | Admitting: Internal Medicine

## 2014-08-14 NOTE — Telephone Encounter (Signed)
[facility] --Dr Mignon Pine Dept of Rad [phone]  Pt has fibroids and needs to have them removed.  Needs to  Have an MRI as she can not have a CT due to contrast allergy.  Talked about pre-medicating but wants to talk to Dr Lovena Le first.  Dr Lovena Le back on 08/21/14 and will call her then.

## 2014-08-14 NOTE — Telephone Encounter (Signed)
New message     Talk to the nurse about sending a MRI report to another doctor.  I tried to have her talk to medical records, she said she has already talked to them and want to talk to the nurse

## 2014-08-15 ENCOUNTER — Ambulatory Visit (INDEPENDENT_AMBULATORY_CARE_PROVIDER_SITE_OTHER): Payer: Medicaid Other | Admitting: Gastroenterology

## 2014-08-15 ENCOUNTER — Encounter: Payer: Self-pay | Admitting: Gastroenterology

## 2014-08-15 VITALS — BP 106/70 | HR 100 | Ht 67.0 in | Wt 167.4 lb

## 2014-08-15 DIAGNOSIS — R112 Nausea with vomiting, unspecified: Secondary | ICD-10-CM | POA: Insufficient documentation

## 2014-08-15 MED ORDER — ONDANSETRON HCL 4 MG PO TABS: 4.0000 mg | ORAL_TABLET | Freq: Three times a day (TID) | ORAL | Status: DC | PRN

## 2014-08-15 MED ORDER — LUBIPROSTONE 8 MCG PO CAPS: 24.0000 ug | ORAL_CAPSULE | Freq: Two times a day (BID) | ORAL | Status: DC

## 2014-08-15 NOTE — Assessment & Plan Note (Signed)
On Amitiza 8 g twice a day she is moving her bowels perhaps twice a week.  Plan to increase to 24 g twice a day

## 2014-08-15 NOTE — Patient Instructions (Signed)
We will send in a new prescription to your pharmacy Call in a few days if you are not feeling better

## 2014-08-15 NOTE — Assessment & Plan Note (Signed)
Primarily nausea with 2 episodes of vomiting could be due to medication-effect.  She had been taking Augmentin.  While she remains constipated, this is been a chronic problem.  Doubt gastroenteritis  Recommendations #1 Zofran when necessary #2 increase Amitiza to 24 g twice a day

## 2014-08-15 NOTE — Progress Notes (Signed)
      History of Present Illness:  Ms. Ell is complaining of 5 days of nausea.  She's had 2 episodes of vomiting.  She complains of abdominal fullness but denies pain.  Last bowel movement was 5 days ago.  Her that it was 3 days before.  She is without fever, myalgias or arthralgias.  She had been taking Augmentin which recently was discontinued.  She's also had a couple of episodes of small amounts of blood on the toilet tissue and in the water.    Review of Systems: Pertinent positive and negative review of systems were noted in the above HPI section. All other review of systems were otherwise negative.    Current Medications, Allergies, Past Medical History, Past Surgical History, Family History and Social History were reviewed in [facility] record  Vital signs were reviewed in today's medical record. Physical Exam: General: Slightly ill-appearing in no acute distress Skin: anicteric Head: Normocephalic and atraumatic Eyes:  sclerae anicteric, EOMI Ears: Normal auditory acuity Mouth: No deformity or lesions Lungs: Clear throughout to auscultation Heart: Regular rate and rhythm; no murmurs, rubs or bruits Abdomen: Soft, non tender and non distended. No masses, hepatosplenomegaly or hernias noted. Normal Bowel sounds Rectal:deferred Musculoskeletal: Symmetrical with no gross deformities  Pulses:  Normal pulses noted Extremities: No clubbing, cyanosis, edema or deformities noted Neurological: Alert oriented x 4, grossly nonfocal Psychological:  Alert and cooperative. Normal mood and affect  See Assessment and Plan under Problem List

## 2014-08-24 ENCOUNTER — Ambulatory Visit (INDEPENDENT_AMBULATORY_CARE_PROVIDER_SITE_OTHER): Payer: Medicaid Other | Admitting: *Deleted

## 2014-08-24 DIAGNOSIS — I4581 Long QT syndrome: Secondary | ICD-10-CM | POA: Diagnosis not present

## 2014-08-24 LAB — MDC_IDC_ENUM_SESS_TYPE_INCLINIC
Battery Voltage: 2.66 V
Date Time Interrogation Session: 20160408161904
HIGH POWER IMPEDANCE MEASURED VALUE: 41 Ohm
HIGH POWER IMPEDANCE MEASURED VALUE: 43 Ohm
HIGH POWER IMPEDANCE MEASURED VALUE: 44 Ohm
HIGH POWER IMPEDANCE MEASURED VALUE: 44 Ohm
HIGH POWER IMPEDANCE MEASURED VALUE: 44 Ohm
HIGH POWER IMPEDANCE MEASURED VALUE: 46 Ohm
HIGH POWER IMPEDANCE MEASURED VALUE: 46 Ohm
HIGH POWER IMPEDANCE MEASURED VALUE: 55 Ohm
HIGH POWER IMPEDANCE MEASURED VALUE: 56 Ohm
HIGH POWER IMPEDANCE MEASURED VALUE: 58 Ohm
HIGH POWER IMPEDANCE MEASURED VALUE: 59 Ohm
HighPow Impedance: 42 Ohm
HighPow Impedance: 43 Ohm
HighPow Impedance: 43 Ohm
HighPow Impedance: 44 Ohm
HighPow Impedance: 44 Ohm
HighPow Impedance: 44 Ohm
HighPow Impedance: 44 Ohm
HighPow Impedance: 44 Ohm
HighPow Impedance: 44 Ohm
HighPow Impedance: 56 Ohm
HighPow Impedance: 57 Ohm
HighPow Impedance: 57 Ohm
HighPow Impedance: 57 Ohm
HighPow Impedance: 58 Ohm
HighPow Impedance: 58 Ohm
HighPow Impedance: 58 Ohm
HighPow Impedance: 58 Ohm
HighPow Impedance: 59 Ohm
HighPow Impedance: 60 Ohm
HighPow Impedance: 62 Ohm
Lead Channel Impedance Value: 416 Ohm
Lead Channel Impedance Value: 416 Ohm
Lead Channel Impedance Value: 424 Ohm
Lead Channel Impedance Value: 424 Ohm
Lead Channel Impedance Value: 424 Ohm
Lead Channel Impedance Value: 424 Ohm
Lead Channel Impedance Value: 424 Ohm
Lead Channel Impedance Value: 424 Ohm
Lead Channel Impedance Value: 432 Ohm
Lead Channel Impedance Value: 432 Ohm
Lead Channel Sensing Intrinsic Amplitude: 6 mV
Lead Channel Sensing Intrinsic Amplitude: 6.3 mV
Lead Channel Sensing Intrinsic Amplitude: 6.4 mV
Lead Channel Sensing Intrinsic Amplitude: 6.9 mV
Lead Channel Sensing Intrinsic Amplitude: 6.9 mV
Lead Channel Sensing Intrinsic Amplitude: 7.1 mV
Lead Channel Sensing Intrinsic Amplitude: 7.5 mV
Lead Channel Sensing Intrinsic Amplitude: 7.9 mV
Lead Channel Setting Pacing Amplitude: 2.5 V
Lead Channel Setting Pacing Pulse Width: 0.6 ms
Lead Channel Setting Sensing Sensitivity: 0.3 mV
MDC IDC MSMT LEADCHNL RV IMPEDANCE VALUE: 416 Ohm
MDC IDC MSMT LEADCHNL RV IMPEDANCE VALUE: 424 Ohm
MDC IDC MSMT LEADCHNL RV IMPEDANCE VALUE: 424 Ohm
MDC IDC MSMT LEADCHNL RV IMPEDANCE VALUE: 432 Ohm
MDC IDC MSMT LEADCHNL RV IMPEDANCE VALUE: 440 Ohm
MDC IDC MSMT LEADCHNL RV PACING THRESHOLD AMPLITUDE: 2.5 V
MDC IDC MSMT LEADCHNL RV PACING THRESHOLD PULSEWIDTH: 0.2 ms
MDC IDC MSMT LEADCHNL RV SENSING INTR AMPL: 6.4 mV
MDC IDC MSMT LEADCHNL RV SENSING INTR AMPL: 6.8 mV
MDC IDC MSMT LEADCHNL RV SENSING INTR AMPL: 7 mV
MDC IDC MSMT LEADCHNL RV SENSING INTR AMPL: 7.2 mV
MDC IDC MSMT LEADCHNL RV SENSING INTR AMPL: 7.4 mV
MDC IDC MSMT LEADCHNL RV SENSING INTR AMPL: 7.8 mV
MDC IDC MSMT LEADCHNL RV SENSING INTR AMPL: 8 mV
MDC IDC SET ZONE DETECTION INTERVAL: 250 ms
MDC IDC SET ZONE DETECTION INTERVAL: 370 ms
MDC IDC STAT BRADY RV PERCENT PACED: 0 %
Zone Setting Detection Interval: 300 ms

## 2014-08-24 NOTE — Progress Notes (Signed)
ICD check in clinic. Normal device function. Threshold and sensing consistent with previous device measurements. Impedance trends stable over time. SIC=0. No evidence of any ventricular arrhythmias. Histogram distribution appropriate for patient and level of activity. No changes made this session. Device programmed at appropriate safety margins. Device programmed to optimize intrinsic conduction. Batt voltage 2.66V (ERI 2.62V). Pt will follow up with GT in 11-2014. Patient education completed including shock plan. Alert tones demonstrated for patient.

## 2014-08-28 ENCOUNTER — Other Ambulatory Visit: Payer: Self-pay | Admitting: Critical Care Medicine

## 2014-08-28 DIAGNOSIS — R0600 Dyspnea, unspecified: Secondary | ICD-10-CM

## 2014-08-29 ENCOUNTER — Encounter (INDEPENDENT_AMBULATORY_CARE_PROVIDER_SITE_OTHER): Payer: Self-pay

## 2014-08-29 ENCOUNTER — Ambulatory Visit: Payer: Medicaid Other | Admitting: Critical Care Medicine

## 2014-08-29 ENCOUNTER — Ambulatory Visit (INDEPENDENT_AMBULATORY_CARE_PROVIDER_SITE_OTHER): Payer: Medicaid Other | Admitting: Critical Care Medicine

## 2014-08-29 DIAGNOSIS — R0600 Dyspnea, unspecified: Secondary | ICD-10-CM

## 2014-08-29 LAB — PULMONARY FUNCTION TEST
FEF 25-75 POST: 2.88 L/s
FEF 25-75 PRE: 2.44 L/s
FEF2575-%Change-Post: 18 %
FEF2575-%PRED-POST: 96 %
FEF2575-%Pred-Pre: 81 %
FEV1-%CHANGE-POST: 4 %
FEV1-%Pred-Post: 89 %
FEV1-%Pred-Pre: 85 %
FEV1-Post: 2.54 L
FEV1-Pre: 2.42 L
FEV1FVC-%Change-Post: 0 %
FEV1FVC-%PRED-PRE: 98 %
FEV6-%Change-Post: 4 %
FEV6-%Pred-Post: 91 %
FEV6-%Pred-Pre: 87 %
FEV6-POST: 3.11 L
FEV6-Pre: 2.98 L
FEV6FVC-%PRED-POST: 102 %
FEV6FVC-%Pred-Pre: 102 %
FVC-%CHANGE-POST: 4 %
FVC-%PRED-PRE: 85 %
FVC-%Pred-Post: 89 %
FVC-Post: 3.11 L
FVC-Pre: 2.98 L
PRE FEV1/FVC RATIO: 81 %
Post FEV1/FVC ratio: 81 %
Post FEV6/FVC ratio: 100 %
Pre FEV6/FVC Ratio: 100 %
RV % pred: 50 %
RV: 0.92 L
TLC % pred: 64 %
TLC: 3.63 L

## 2014-08-29 NOTE — Progress Notes (Signed)
PFT done today. 

## 2014-08-30 DIAGNOSIS — D259 Leiomyoma of uterus, unspecified: Secondary | ICD-10-CM | POA: Insufficient documentation

## 2014-08-30 DIAGNOSIS — D219 Benign neoplasm of connective and other soft tissue, unspecified: Secondary | ICD-10-CM | POA: Insufficient documentation

## 2014-08-31 ENCOUNTER — Telehealth: Payer: Self-pay | Admitting: Critical Care Medicine

## 2014-08-31 NOTE — Telephone Encounter (Signed)
lmtcb x1 

## 2014-08-31 NOTE — Telephone Encounter (Signed)
Pt is requesting a refill of her Symbicort - never filled in our office.  Pt wanting to know if she needs to stay on it with her test being normal.  Pt having some SOB and cough.  Pt also reports that her PCP was filling a Rx for Xopenex hfa - pt would like a refill from our office.  Is Xopenex okay or do you prefer Proair?  Results have been explained to patient, pt expressed understanding. Nothing further needed.  Notes Recorded by Adalberto Ill, RN on 08/31/2014 at 8:29 AM ATC pt - went directly to VM. lmomtcb Notes Recorded by Elsie Stain, MD on 08/30/2014 at 3:18 PM Let pt know lung function stable, will discuss at OV   Please advise Dr Joya Gaskins. Thanks.  Allergies  Allergen Reactions  . Contrast Media [Iodinated Diagnostic Agents] Anaphylaxis and Shortness Of Breath    Needed to be defibrillated   . Codeine Nausea And Vomiting    jittery  . Iohexol      Code: RASH, Desc: PT STATES SHE IS ALLERGIC TO IV CONTRAST 05/28/06/RM, Onset Date: [id number]   . Meperidine Hcl Nausea And Vomiting  . Morphine And Related Nausea And Vomiting

## 2014-08-31 NOTE — Progress Notes (Signed)
Quick Note:  ATC pt - went directly to VM. lmomtcb ______

## 2014-08-31 NOTE — Telephone Encounter (Signed)
Dc symbicort Start Qvar 57mcg two puff bid

## 2014-08-31 NOTE — Telephone Encounter (Signed)
08-31-14 pt's mother came into office--due to problems getting through on the phone-says pt needs to have surgery and has been waiting two months to get clearance--the only message I see is from 08-14-14 and at that time pt was told Dr. Lovena Le wouldn't be in until 08-21-14 which was last Tuesday. She left the office so I called pt back @ [phone] @1128am  and left a VM that Dr. Lovena Le will be back in the office on Monday afternoon and I would leave this message to return the call.

## 2014-09-03 ENCOUNTER — Other Ambulatory Visit: Payer: Self-pay | Admitting: *Deleted

## 2014-09-03 MED ORDER — BUDESONIDE-FORMOTEROL FUMARATE 160-4.5 MCG/ACT IN AERO: 2.0000 | INHALATION_SPRAY | Freq: Two times a day (BID) | RESPIRATORY_TRACT | Status: DC

## 2014-09-03 NOTE — Telephone Encounter (Signed)
lmtcb x2 for pt. 

## 2014-09-03 NOTE — Progress Notes (Signed)
Quick Note:  Spoke with pt - Discussed results per. Dr .Joya Gaskins. She verbalized understanding. ______

## 2014-09-03 NOTE — Telephone Encounter (Signed)
I have left the patient a message that the information was sent on 08/23/14 to Dr Pecola Lawless and we will send again today with Dr Tanna Furry recommendations.    08/21/14 Dr Lovena Le advised no MRI.  Patient had SOB, decreased oxygen saturations, throat swelling with IV contrast.  This resolved with epinephrine, steroids and benadryl.  Would pre-medicate prior to contrast.

## 2014-09-04 NOTE — Telephone Encounter (Signed)
lmtcb for pt.  

## 2014-09-05 ENCOUNTER — Telehealth: Payer: Self-pay | Admitting: Critical Care Medicine

## 2014-09-05 MED ORDER — BECLOMETHASONE DIPROPIONATE 80 MCG/ACT IN AERS: 2.0000 | INHALATION_SPRAY | Freq: Two times a day (BID) | RESPIRATORY_TRACT | Status: DC

## 2014-09-05 NOTE — Telephone Encounter (Signed)
Loretta Stain, MD at 08/31/2014 11:36 AM     Status: Signed       Expand All Collapse All   Dc symbicort Start Qvar 65mcg two puff bid            Virl Cagey, CMA at 08/31/2014 10:18 AM     Status: Signed       Expand All Collapse All   Pt is requesting a refill of her Symbicort - never filled in our office.  Pt wanting to know if she needs to stay on it with her test being normal.  Pt having some SOB and cough.  Pt also reports that her PCP was filling a Rx for Xopenex hfa - pt would like a refill from our office.  Is Xopenex okay or do you prefer Proair?  Results have been explained to patient, pt expressed understanding. Nothing further needed.  Notes Recorded by Adalberto Ill, RN on 08/31/2014 at 8:29 AM ATC pt - went directly to VM. lmomtcb Notes Recorded by Loretta Stain, MD on 08/30/2014 at 3:18 PM Let pt know lung function stable, will discuss at OV   Please advise Dr Joya Gaskins. Thanks.  Allergies  Allergen Reactions  . Contrast Media [Iodinated Diagnostic Agents] Anaphylaxis and Shortness Of Breath    Needed to be defibrillated   . Codeine Nausea And Vomiting    jittery  . Iohexol     Code: RASH, Desc: PT STATES SHE IS ALLERGIC TO IV CONTRAST 05/28/06/RM, Onset Date: [id number]   . Meperidine Hcl Nausea And Vomiting  . Morphine And Related Nausea And Vomiting          Pt is aware of the medication change. Rx has been sent in. Nothing further was needed.

## 2014-09-05 NOTE — Telephone Encounter (Signed)
LMTCb x 4 Letter mailed to contact our office.  Will close per triage protocol.

## 2014-09-10 ENCOUNTER — Telehealth: Payer: Self-pay | Admitting: Critical Care Medicine

## 2014-09-10 NOTE — Telephone Encounter (Signed)
lmtcb x1 for pt. 

## 2014-09-11 NOTE — Telephone Encounter (Signed)
Pt returned call-she only needed something for her allergies; states she is out of Zyrtec and wanted nasal spray. Pt aware that her Zyrtec and Flonase NS are both OTC meds and can get them. Nothing more needed at this time.

## 2014-09-11 NOTE — Telephone Encounter (Signed)
lmtcb for pt.  

## 2014-09-13 ENCOUNTER — Telehealth: Payer: Self-pay | Admitting: Critical Care Medicine

## 2014-09-13 ENCOUNTER — Encounter: Payer: Self-pay | Admitting: Internal Medicine

## 2014-09-13 NOTE — Telephone Encounter (Signed)
lmomtcb x1 

## 2014-09-14 NOTE — Telephone Encounter (Signed)
lmtcb X2 for pt.  

## 2014-09-17 NOTE — Telephone Encounter (Signed)
lmtcb x3 for pt. 

## 2014-09-18 ENCOUNTER — Telehealth: Payer: Self-pay | Admitting: Internal Medicine

## 2014-09-18 NOTE — Telephone Encounter (Signed)
Request for surgical clearance:  1. What type of surgery is being performed? Total abdominal hysterectomy  2. When is this surgery scheduled? 10/30/14  3. Are there any medications that need to be held prior to surgery and how long? No  4. Name of physician performing surgery? Rayford Halsted  5. What is your office phone and fax number? [phone] / [phone]

## 2014-09-18 NOTE — Telephone Encounter (Signed)
Per Dr Lovena Le low risk from a cardiovascular standpoint.  Okay to proceed with surgery

## 2014-09-19 ENCOUNTER — Telehealth: Payer: Self-pay | Admitting: Critical Care Medicine

## 2014-09-19 MED ORDER — BENZONATATE 200 MG PO CAPS: 200.0000 mg | ORAL_CAPSULE | Freq: Three times a day (TID) | ORAL | Status: DC | PRN

## 2014-09-19 NOTE — Telephone Encounter (Signed)
Called spoke w/ pt. Aware RX has been sent in. Nothing further needed

## 2014-09-19 NOTE — Telephone Encounter (Signed)
i am ok with this refill 

## 2014-09-19 NOTE — Telephone Encounter (Signed)
Called and spoke to pt. Pt requesting refill on Tessalon. Pt last seen by TP on 07/30/2014 and has a pending appt with PW on 10/03/2014.  PW please advise if ok to refill. Thanks.   Allergies  Allergen Reactions  . Contrast Media [Iodinated Diagnostic Agents] Anaphylaxis and Shortness Of Breath    Needed to be defibrillated   . Codeine Nausea And Vomiting    jittery  . Iohexol      Code: RASH, Desc: PT STATES SHE IS ALLERGIC TO IV CONTRAST 05/28/06/RM, Onset Date: [id number]   . Meperidine Hcl Nausea And Vomiting  . Morphine And Related Nausea And Vomiting

## 2014-09-24 ENCOUNTER — Telehealth: Payer: Self-pay | Admitting: Internal Medicine

## 2014-09-24 NOTE — Telephone Encounter (Signed)
New message     Request for surgical clearance:  1. What type of surgery is being performed? Hyster   2. When is this surgery scheduled? 6.14.2016   3. Are there any medications that need to be held prior to surgery and how long? No   4. Name of physician performing surgery? Dr. Claiborne Billings   5. What is your office phone and fax number?[phone] / fax [phone]

## 2014-09-24 NOTE — Telephone Encounter (Signed)
This was faxed already.  Will forward to Kim in medical records to refax

## 2014-09-24 NOTE — Telephone Encounter (Signed)
Re-faxed.

## 2014-10-03 ENCOUNTER — Encounter: Payer: Self-pay | Admitting: Critical Care Medicine

## 2014-10-03 ENCOUNTER — Other Ambulatory Visit: Payer: Medicaid Other

## 2014-10-03 ENCOUNTER — Ambulatory Visit (INDEPENDENT_AMBULATORY_CARE_PROVIDER_SITE_OTHER): Payer: Medicaid Other | Admitting: Critical Care Medicine

## 2014-10-03 ENCOUNTER — Telehealth: Payer: Self-pay | Admitting: Critical Care Medicine

## 2014-10-03 VITALS — BP 114/76 | HR 103 | Temp 99.2°F | Wt 155.0 lb

## 2014-10-03 DIAGNOSIS — R748 Abnormal levels of other serum enzymes: Secondary | ICD-10-CM | POA: Diagnosis not present

## 2014-10-03 DIAGNOSIS — J449 Chronic obstructive pulmonary disease, unspecified: Secondary | ICD-10-CM

## 2014-10-03 DIAGNOSIS — R1011 Right upper quadrant pain: Secondary | ICD-10-CM

## 2014-10-03 DIAGNOSIS — R05 Cough: Secondary | ICD-10-CM

## 2014-10-03 DIAGNOSIS — D869 Sarcoidosis, unspecified: Secondary | ICD-10-CM

## 2014-10-03 DIAGNOSIS — R059 Cough, unspecified: Secondary | ICD-10-CM

## 2014-10-03 MED ORDER — HYDROCODONE-HOMATROPINE 5-1.5 MG/5ML PO SYRP: 5.0000 mL | ORAL_SOLUTION | Freq: Four times a day (QID) | ORAL | Status: DC | PRN

## 2014-10-03 NOTE — Progress Notes (Signed)
Subjective:    Patient ID: [name], female    DOB: 1971/01/12, 44 y.o.   MRN: [id number]  HPI 10/03/2014 Chief Complaint  Patient presents with  . Follow-up    cough still there; tessalon only works for about hour, taking Delsym also; prod cough w/clear/green mucus; stuffy nose  Weight is down , not on purpose.  appt is down.  Pt still with cough, coughs up green mucus, no real wheeze, no chest pain.  Notes low grade fever.  Notes palpitation with heart and sees cards.  Wt Readings from Last 3 Encounters:  10/03/14 155 lb (70.308 kg)  08/15/14 167 lb 6 oz (75.921 kg)  07/20/14 171 lb 12.8 oz (77.928 kg)     +++nasal congestion or +++excess secretions, ++fever, ++chills, ++, ++++++unintended weight loss, NO pleurtic or exertional chest pain, orthopnea PND, or leg swelling Pt with daily emesis Pt denies any increase in rescue therapy over baseline, denies waking up needing it or having any early am or nocturnal exacerbations of coughing/wheezing/or dyspnea. Pt also denies any obvious fluctuation in symptoms with  weather or environmental change or other alleviating or aggravating factors   Current Medications, Allergies, Complete Past Medical History, Past Surgical History, Family History, and Social History were reviewed in [employer] record.  Past Medical History  Diagnosis Date  . Long Q-T syndrome   . Palpitation   . Seizure disorder   . Abdominal pain, periumbilic   . Esophageal stricture   . Sarcoidosis   . DM (diabetes mellitus)   . Allergic rhinitis   . Bronchitis   . Sarcoidosis   . GERD (gastroesophageal reflux disease)   . Asthma   . Fibromyalgia   . 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 Stroke     QUESTIONABLE TIA   . Neuropathy in diabetes   . Seizures     HISTORY OF TAKING KLONIPIN NOT RX AT THE MOMENT  . Cancer     Nodules in lungs pt believes were cancerous pt unsure  . Arthritis   .  Internal hemorrhoids   . Eye hemorrhage     bilateral  . Fibroid, uterine      Family History  Problem Relation Age of Onset  . Cancer    . Diabetes       History   Social History  . Marital Status: Single    Spouse Name: N/A  . Number of Children: N/A  . Years of Education: N/A   Occupational History  . Not on file.   Social History Main Topics  . Smoking status: Never Smoker   . Smokeless tobacco: Never Used  . Alcohol Use: Yes     Comment: occ.  . Drug Use: No  . Sexual Activity: Not on file   Other Topics Concern  . Not on file   Social History Narrative     Allergies  Allergen Reactions  . Contrast Media [Iodinated Diagnostic Agents] Anaphylaxis and Shortness Of Breath    Needed to be defibrillated   . Codeine Nausea And Vomiting    jittery  . Iohexol      Code: RASH, Desc: PT STATES SHE IS ALLERGIC TO IV CONTRAST 05/28/06/RM, Onset Date: 90300923   . Meperidine Hcl Nausea And Vomiting  . Morphine And Related Nausea And Vomiting     Outpatient Prescriptions Prior to Visit  Medication Sig Dispense Refill  . atenolol (TENORMIN) 50 MG tablet Take 1 tablet (  50 mg total) by mouth 2 (two) times daily. 90 tablet 3  . beclomethasone (QVAR) 80 MCG/ACT inhaler Inhale 2 puffs into the lungs 2 (two) times daily. 1 Inhaler 5  . benzonatate (TESSALON) 200 MG capsule Take 1 capsule (200 mg total) by mouth 3 (three) times daily as needed for cough. 30 capsule 1  . betamethasone dipropionate (DIPROLENE) 0.05 % cream Apply topically 2 (two) times daily. 30 g 1  . cetirizine (ZYRTEC) 10 MG tablet Take 10 mg by mouth daily as needed for allergies.     . clonazePAM (KLONOPIN) 0.5 MG tablet Take 0.5 mg by mouth daily.  0  . cyclobenzaprine (FLEXERIL) 10 MG tablet Take 10 mg by mouth 3 (three) times daily as needed for muscle spasms.     . cycloSPORINE (RESTASIS) 0.05 % ophthalmic emulsion Place 1 drop into both eyes 2 (two) times daily.     . diclofenac sodium (VOLTAREN) 1 %  GEL Apply 2 g topically 2 (two) times daily as needed (pain).     Marland Kitchen gabapentin (NEURONTIN) 100 MG capsule Take 1 capsule (100 mg total) by mouth 3 (three) times daily. 90 capsule 2  . glimepiride (AMARYL) 2 MG tablet Take 2 mg by mouth 2 (two) times daily at 8 am and 10 pm.     . insulin aspart (NOVOLOG) 100 UNIT/ML injection Inject 4-8 Units into the skin 3 (three) times daily before meals. Sliding scale    . insulin glargine (LANTUS) 100 UNIT/ML injection Inject 35 Units into the skin at bedtime.     Marland Kitchen lubiprostone (AMITIZA) 8 MCG capsule Take 3 capsules (24 mcg total) by mouth 2 (two) times daily with a meal. 30 capsule 2  . metFORMIN (GLUCOPHAGE) 500 MG tablet Take 500 mg by mouth daily with breakfast.    . Olopatadine HCl (PATADAY) 0.2 % SOLN Place 1 drop into both eyes 2 (two) times daily.     . ondansetron (ZOFRAN) 4 MG tablet Take 1 tablet (4 mg total) by mouth every 8 (eight) hours as needed for nausea or vomiting. 30 tablet 1  . budesonide-formoterol (SYMBICORT) 160-4.5 MCG/ACT inhaler Inhale 2 puffs into the lungs 2 (two) times daily. (Patient not taking: Reported on 10/03/2014) 1 Inhaler 3  . HYDROcodone-acetaminophen (HYCET) 7.5-325 mg/15 ml solution Take 10 mLs by mouth 4 (four) times daily as needed for moderate pain or severe pain. (Patient not taking: Reported on 10/03/2014) 150 mL 0  . ibuprofen (ADVIL,MOTRIN) 800 MG tablet Take 1 tablet (800 mg total) by mouth every 8 (eight) hours as needed for mild pain or moderate pain. (Patient not taking: Reported on 10/03/2014) 15 tablet 0  . omeprazole (PRILOSEC) 20 MG capsule take 1 capsule by mouth twice a day (Patient not taking: Reported on 10/03/2014) 60 capsule 0  . traMADol (ULTRAM) 50 MG tablet Take 1 tablet (50 mg total) by mouth every 6 (six) hours as needed. (Patient not taking: Reported on 10/03/2014) 40 tablet 0   No facility-administered medications prior to visit.    Review of Systems  Constitutional: Positive for fever, chills,  appetite change, fatigue and unexpected weight change. Negative for diaphoresis and activity change.  HENT: Positive for postnasal drip and sinus pressure. Negative for congestion, dental problem, ear discharge, ear pain, facial swelling, hearing loss, mouth sores, nosebleeds, rhinorrhea, sneezing, sore throat, tinnitus, trouble swallowing and voice change.   Eyes: Negative for photophobia, discharge, itching and visual disturbance.  Respiratory: Positive for cough, choking, chest tightness, shortness of breath  and wheezing. Negative for apnea and stridor.   Cardiovascular: Negative for chest pain, palpitations and leg swelling.  Gastrointestinal: Negative for nausea, vomiting, abdominal pain, constipation, blood in stool and abdominal distention.       Notes daily GERD, no meds, no insurance coverage for prilosec  Genitourinary: Negative for dysuria, urgency, frequency, hematuria, flank pain, decreased urine volume and difficulty urinating.  Musculoskeletal: Negative for myalgias, back pain, joint swelling, arthralgias, gait problem, neck pain and neck stiffness.  Skin: Negative for color change, pallor and rash.  Neurological: Negative for dizziness, tremors, seizures, syncope, speech difficulty, weakness, light-headedness, numbness and headaches.  Hematological: Negative for adenopathy. Does not bruise/bleed easily.  Psychiatric/Behavioral: Negative for confusion, sleep disturbance and agitation. The patient is not nervous/anxious.        Objective:   Physical Exam  Constitutional: She appears well-developed and well-nourished. She is active.  Non-toxic appearance. She does not appear ill. No distress.  HENT:  Head: Normocephalic and atraumatic.  Nose: No mucosal edema, rhinorrhea, sinus tenderness, nasal deformity or septal deviation. No epistaxis. Right sinus exhibits no maxillary sinus tenderness and no frontal sinus tenderness. Left sinus exhibits no maxillary sinus tenderness and no  frontal sinus tenderness.  Mouth/Throat: Oropharynx is clear and moist. No oropharyngeal exudate.  Eyes: Conjunctivae and EOM are normal. Pupils are equal, round, and reactive to light. Right eye exhibits no discharge. Left eye exhibits no discharge. No scleral icterus.  Neck: Normal range of motion. Neck supple. Normal carotid pulses, no hepatojugular reflux and no JVD present. No tracheal tenderness and no muscular tenderness present. Carotid bruit is not present. No rigidity. No tracheal deviation, no edema, no erythema and normal range of motion present. No thyroid mass and no thyromegaly present.  Cardiovascular: Normal rate, regular rhythm, S1 normal, S2 normal, normal heart sounds, intact distal pulses and normal pulses.  PMI is not displaced.  Exam reveals no gallop, no S3, no S4, no distant heart sounds and no friction rub.   No murmur heard.  No systolic murmur is present   No diastolic murmur is present  Pulmonary/Chest: No accessory muscle usage or stridor. No apnea and no tachypnea. No respiratory distress. She has no decreased breath sounds. She has no wheezes. She has no rhonchi. She has no rales. Chest wall is not dull to percussion. She exhibits no mass, no tenderness, no bony tenderness and no deformity.  Abdominal: Soft. Normal appearance and bowel sounds are normal. She exhibits no distension, no ascites and no mass. There is no hepatosplenomegaly. There is no tenderness. There is no rigidity, no rebound, no guarding and no CVA tenderness.  Musculoskeletal: Normal range of motion.  Lymphadenopathy:       Head (right side): No submental and no submandibular adenopathy present.       Head (left side): No submental and no submandibular adenopathy present.    She has no cervical adenopathy.    She has no axillary adenopathy.  Neurological: She is alert. She has normal strength and normal reflexes. No cranial nerve deficit or sensory deficit.  Skin: Skin is warm and dry. No bruising,  no ecchymosis, no lesion and no rash noted. She is not diaphoretic. No cyanosis or erythema. No pallor. Nails show no clubbing.  Psychiatric: She has a normal mood and affect. Her speech is normal and behavior is normal.  Vitals reviewed.         Assessment & Plan:  I personally reviewed all images and lab data in the [facility]  system as well as any outside material available during this office visit and agree with the  radiology impressions.  I also have reviewed any data /notes/records if available in care everywhere.  OBSTRUCTIVE CHRONIC BRONCHITIS Obstructive bronchitis /asthma Now GI symptoms: emesis/ decrease appt Plan GI referral GI u/s Cont inhaled meds No other changes    Sarcoidosis Chronic sarcoid No change in rx    Cough Cyclic cough Cough syrup prn    ABG pain/nausea/RUQ abd pain, unexplained weight loss: Elevated Alk Phosp 410 Plan Isoenzymes alk phosph GI referral U/s of liver /GB  Tannisha was seen today for follow-up.  Diagnoses and all orders for this visit:  Elevated alkaline phosphatase level Orders: -     US Abdomen Complete; Future -     Alkaline Phosphatase, Isoenzymes; Future -     Ambulatory referral to Gastroenterology -     Alkaline Phosphatase, Isoenzymes; Future  Right upper quadrant pain Orders: -     US Abdomen Complete; Future -     Alkaline Phosphatase, Isoenzymes; Future -     Ambulatory referral to Gastroenterology -     Alkaline Phosphatase, Isoenzymes; Future  OBSTRUCTIVE CHRONIC BRONCHITIS  Sarcoidosis  Cough  Other orders -     HYDROcodone-homatropine (HYCODAN) 5-1.5 MG/5ML syrup; Take 5 mLs by mouth every 6 (six) hours as needed for cough.    I had an extended discussion with the patient reviewing all relevant studies completed to date and  lasting 15 to 20 minutes of a 25 minute visit on the following ongoing concerns:

## 2014-10-03 NOTE — Telephone Encounter (Signed)
This is least expensive cough syrup Rx Can try delsym OTC

## 2014-10-03 NOTE — Assessment & Plan Note (Signed)
Obstructive bronchitis /asthma Now GI symptoms: emesis/ decrease appt Plan GI referral GI u/s Cont inhaled meds No other changes

## 2014-10-03 NOTE — Telephone Encounter (Signed)
Called and spoke to pt. Pt stated the Hycodan is too expensive ($56) and pt cannot afford this right now. Pt requesting an alternative.   PW please advise.   Allergies  Allergen Reactions  . Contrast Media [Iodinated Diagnostic Agents] Anaphylaxis and Shortness Of Breath    Needed to be defibrillated   . Codeine Nausea And Vomiting    jittery  . Iohexol      Code: RASH, Desc: PT STATES SHE IS ALLERGIC TO IV CONTRAST 05/28/06/RM, Onset Date: 41660630   . Meperidine Hcl Nausea And Vomiting  . Morphine And Related Nausea And Vomiting

## 2014-10-03 NOTE — Assessment & Plan Note (Signed)
Chronic sarcoid No change in rx

## 2014-10-03 NOTE — Telephone Encounter (Signed)
Pt is aware of PW's recommendation. Nothing further was needed.

## 2014-10-03 NOTE — Patient Instructions (Signed)
Cough syrup refilled GI referral made Labs today Ultrasound of abdomen ordered No other changes Consider delay of gyn surgery one month Return 1 month

## 2014-10-03 NOTE — Assessment & Plan Note (Signed)
Cyclic cough Cough syrup prn

## 2014-10-08 ENCOUNTER — Ambulatory Visit (HOSPITAL_COMMUNITY): Payer: Medicaid Other

## 2014-10-08 ENCOUNTER — Ambulatory Visit (HOSPITAL_COMMUNITY)
Admission: RE | Admit: 2014-10-08 | Discharge: 2014-10-08 | Disposition: A | Discharge status: Home / Self Care | Payer: Medicaid Other | Source: Ambulatory Visit | Attending: Critical Care Medicine | Admitting: Critical Care Medicine

## 2014-10-08 DIAGNOSIS — R748 Abnormal levels of other serum enzymes: Secondary | ICD-10-CM

## 2014-10-08 DIAGNOSIS — E119 Type 2 diabetes mellitus without complications: Secondary | ICD-10-CM | POA: Diagnosis not present

## 2014-10-08 DIAGNOSIS — R1011 Right upper quadrant pain: Secondary | ICD-10-CM | POA: Insufficient documentation

## 2014-10-08 NOTE — Progress Notes (Signed)
Quick Note:  Notify the patient that the ultrasound of the liver is NORMAL. Need to follow up with GI evaluation No change in medications are recommended. Continue current meds as prescribed at last office visit ______

## 2014-10-09 LAB — ALKALINE PHOSPHATASE, ISOENZYMES
Alkaline Phosphatase: 417 IU/L — ABNORMAL HIGH (ref 39–117)
BONE FRACTION: 30 % (ref 14–68)
INTESTINAL FRAC.: 0 % (ref 0–18)
LIVER FRACTION: 70 % (ref 18–85)

## 2014-10-09 NOTE — Progress Notes (Signed)
Quick Note:  Call pt and tell her lab test shows most of the abnormality on her liver function test is coming from the liver, she should keep her GI referral appt ______

## 2014-10-09 NOTE — Progress Notes (Signed)
Quick Note:  Called, spoke with pt. Discussed ultrasound results and recs per Dr. Joya Gaskins. She verbalized understanding. ______

## 2014-10-10 NOTE — Progress Notes (Signed)
Quick Note:  Called, spoke with pt. Discussed lab results and recs per Dr. Joya Gaskins. She verbalized understanding. Pt states she will call GI today to try to schedule OV (see amb ref to GI notes). Pt is aware if she has problems scheduling appt with GI to please call us back today so we can assist. She verbalized understanding, is in agreement with plan, and voiced no further questions or concerns at this time. ______

## 2014-10-16 ENCOUNTER — Telehealth: Payer: Self-pay | Admitting: Gastroenterology

## 2014-10-16 NOTE — Telephone Encounter (Signed)
Patient contacted and given an appointment on 10/22/14 at 2:45 with Dr Deatra Ina. Her alk.phosphate is elevated.

## 2014-10-19 NOTE — Progress Notes (Signed)
Quick Note:  Pt has pending GI appt with Dr. Deatra Ina on October 22, 2014. ______

## 2014-10-22 ENCOUNTER — Ambulatory Visit (INDEPENDENT_AMBULATORY_CARE_PROVIDER_SITE_OTHER): Payer: Medicaid Other | Admitting: Gastroenterology

## 2014-10-22 ENCOUNTER — Encounter: Payer: Self-pay | Admitting: Gastroenterology

## 2014-10-22 ENCOUNTER — Other Ambulatory Visit (INDEPENDENT_AMBULATORY_CARE_PROVIDER_SITE_OTHER): Payer: Medicaid Other

## 2014-10-22 ENCOUNTER — Telehealth: Payer: Self-pay | Admitting: *Deleted

## 2014-10-22 VITALS — BP 106/44 | HR 100 | Temp 100.0°F | Ht 67.0 in | Wt 148.8 lb

## 2014-10-22 DIAGNOSIS — R1110 Vomiting, unspecified: Secondary | ICD-10-CM

## 2014-10-22 DIAGNOSIS — R112 Nausea with vomiting, unspecified: Secondary | ICD-10-CM | POA: Diagnosis not present

## 2014-10-22 DIAGNOSIS — R109 Unspecified abdominal pain: Secondary | ICD-10-CM

## 2014-10-22 LAB — CBC WITH DIFFERENTIAL/PLATELET
BASOS ABS: 0.1 10*3/uL (ref 0.0–0.1)
Basophils Relative: 0.9 % (ref 0.0–3.0)
Eosinophils Absolute: 0.1 10*3/uL (ref 0.0–0.7)
Eosinophils Relative: 0.9 % (ref 0.0–5.0)
HCT: 40.4 % (ref 36.0–46.0)
Hemoglobin: 13.6 g/dL (ref 12.0–15.0)
Lymphocytes Relative: 17.9 % (ref 12.0–46.0)
Lymphs Abs: 1.6 10*3/uL (ref 0.7–4.0)
MCHC: 33.6 g/dL (ref 30.0–36.0)
MCV: 83.3 fl (ref 78.0–100.0)
MONO ABS: 0.6 10*3/uL (ref 0.1–1.0)
Monocytes Relative: 7 % (ref 3.0–12.0)
NEUTROS ABS: 6.4 10*3/uL (ref 1.4–7.7)
NEUTROS PCT: 73.3 % (ref 43.0–77.0)
Platelets: 440 10*3/uL — ABNORMAL HIGH (ref 150.0–400.0)
RBC: 4.85 Mil/uL (ref 3.87–5.11)
RDW: 13.9 % (ref 11.5–15.5)
WBC: 8.7 10*3/uL (ref 4.0–10.5)

## 2014-10-22 LAB — COMPREHENSIVE METABOLIC PANEL
ALT: 12 U/L (ref 0–35)
AST: 21 U/L (ref 0–37)
Albumin: 3.4 g/dL — ABNORMAL LOW (ref 3.5–5.2)
Alkaline Phosphatase: 508 U/L — ABNORMAL HIGH (ref 39–117)
BUN: 4 mg/dL — ABNORMAL LOW (ref 6–23)
CO2: 31 mEq/L (ref 19–32)
Calcium: 9.5 mg/dL (ref 8.4–10.5)
Chloride: 90 mEq/L — ABNORMAL LOW (ref 96–112)
Creatinine, Ser: 0.49 mg/dL (ref 0.40–1.20)
GFR: 177.25 mL/min (ref >60.00)
Glucose, Bld: 165 mg/dL — ABNORMAL HIGH (ref 70–99)
Potassium: 3.9 mEq/L (ref 3.5–5.1)
Sodium: 127 mEq/L — ABNORMAL LOW (ref 135–145)
Total Bilirubin: 1.1 mg/dL (ref 0.2–1.2)
Total Protein: 9.7 g/dL — ABNORMAL HIGH (ref 6.0–8.3)

## 2014-10-22 LAB — AMYLASE: Amylase: 20 U/L — ABNORMAL LOW (ref 27–131)

## 2014-10-22 LAB — LIPASE: LIPASE: 38 U/L (ref 11.0–59.0)

## 2014-10-22 NOTE — Addendum Note (Signed)
Addended by: Oda Kilts on: 10/22/2014 03:27 PM   Modules accepted: Orders

## 2014-10-22 NOTE — Progress Notes (Signed)
      History of Present Illness:  [name] continues to complain of nausea and diffuse upper abdominal pain.  Symptoms are worsened postprandially.  She often will vomit within half hour of eating though this does not relieve her pain.  She denies pyrosis or dysphagia.  She continues to have mild chronic constipation.  Recent alkaline phosphatase was 417.  She has lost over 40 pounds in the past 8 months.  She complains of easy fatigability and lack of energy.  Appetite is poor.    Review of Systems: Pertinent positive and negative review of systems were noted in the above HPI section. All other review of systems were otherwise negative.    Current Medications, Allergies, Past Medical History, Past Surgical History, Family History and Social History were reviewed in [facility] record  Vital signs were reviewed in today's medical record. Physical Exam: General: Chronically ill-appearing female in no acute distress Skin: anicteric Head: Normocephalic and atraumatic Eyes:  sclerae anicteric, EOMI Ears: Normal auditory acuity Mouth: No deformity or lesions Lymph Nodes: no lymphadenopathy Lungs: Clear throughout to auscultation Heart: Regular rate and rhythm; no murmurs, rubs or brui: Gastroinestinal:  Soft,  and non distended. No masses, hepatosplenomegaly or hernias noted. Normal Bowel sounds.  Is mild tenderness to palpation throughout the epigastrium.  There is no succussion splash Rectal:deferred Musculoskeletal: Symmetrical with no gross deformities  Pulses:  Normal pulses noted Extremities: No clubbing, cyanosis, edema or deformities noted Neurological: Alert oriented x 4, grossly nonfocal Psychological:  Alert and cooperative. Normal mood and affect  See Assessment and Plan under Problem List

## 2014-10-22 NOTE — Telephone Encounter (Signed)
L/M FOR PT THAT HER APPOINTMENT TIME AT [facility] HAS CHANGED TO 11:30AM INSTEAD OF 1:30PM  ON WED 6/8

## 2014-10-22 NOTE — Patient Instructions (Signed)
You have been scheduled for a CT scan of the abdomen and pelvis at [facility] ([address]---this is in the same building as [facility]).   You are scheduled on 10/25/2014 at 1:30pm. You should arrive 15 minutes prior to your appointment time for registration. Please follow the written instructions below on the day of your exam:  WARNING: IF YOU ARE ALLERGIC TO IODINE/X-RAY DYE, PLEASE NOTIFY RADIOLOGY IMMEDIATELY AT [phone]! YOU WILL BE GIVEN A 13 HOUR PREMEDICATION PREP.  1) Do not eat or drink anything after 9:30am (4 hours prior to your test) 2) You have been given 2 bottles of oral contrast to drink. The solution may taste               better if refrigerated, but do NOT add ice or any other liquid to this solution. Shake             well before drinking.    Drink 1 bottle of contrast @ 11:30am (2 hours prior to your exam)  Drink 1 bottle of contrast @ 12:30pm (1 hour prior to your exam)  You may take any medications as prescribed with a small amount of water except for the following: Metformin, Glucophage, Glucovance, Avandamet, Riomet, Fortamet, Actoplus Met, Janumet, Glumetza or Metaglip. The above medications must be held the day of the exam AND 48 hours after the exam.  The purpose of you drinking the oral contrast is to aid in the visualization of your intestinal tract. The contrast solution may cause some diarrhea. Before your exam is started, you will be given a small amount of fluid to drink. Depending on your individual set of symptoms, you may also receive an intravenous injection of x-ray contrast/dye. Plan on being at [facility] for 30 minutes or long, depending on the type of exam you are having performed.  This test typically takes 30-45 minutes to complete.  If you have any questions regarding your exam or if you need to reschedule, you may call the CT department at [phone] between the hours of 8:00 am and 5:00 pm,  Monday-Friday.   Go to the basement for labs today  Your EGD is scheduled on 10/24/2014 at [facility] Endoscopy Department Separate instructions have been given  ________________________________________________________________________

## 2014-10-22 NOTE — Assessment & Plan Note (Signed)
Three-month history of progressive nausea, postprandial vomiting and diffuse upper abdominal pain a copy by profound weight loss.  Of the lab available is an elevated alkaline phosphatase.  Occult malignancy is a concern.  Active peptic ulcer disease, gastroparesis are other considerations.  Recommendations #1 CBC, comprehensive metabolic profile, amylase and lipase #2 upper endoscopy #3 CT the abdomen and pelvis #4 to consider gastric emptying scan pending results of above

## 2014-10-23 ENCOUNTER — Encounter (HOSPITAL_COMMUNITY): Payer: Self-pay | Admitting: *Deleted

## 2014-10-24 ENCOUNTER — Ambulatory Visit (HOSPITAL_COMMUNITY): Payer: Medicaid Other | Admitting: Certified Registered Nurse Anesthetist

## 2014-10-24 ENCOUNTER — Encounter (HOSPITAL_COMMUNITY)
Admission: RE | Disposition: A | Discharge status: Home / Self Care | Payer: Self-pay | Source: Ambulatory Visit | Attending: Gastroenterology

## 2014-10-24 ENCOUNTER — Encounter (HOSPITAL_COMMUNITY): Payer: Self-pay | Admitting: Certified Registered Nurse Anesthetist

## 2014-10-24 ENCOUNTER — Ambulatory Visit (HOSPITAL_COMMUNITY)
Admission: RE | Admit: 2014-10-24 | Discharge: 2014-10-24 | Disposition: A | Discharge status: Home / Self Care | Payer: Medicaid Other | Source: Ambulatory Visit | Attending: Gastroenterology | Admitting: Gastroenterology

## 2014-10-24 DIAGNOSIS — R112 Nausea with vomiting, unspecified: Secondary | ICD-10-CM | POA: Diagnosis not present

## 2014-10-24 DIAGNOSIS — K5900 Constipation, unspecified: Secondary | ICD-10-CM | POA: Insufficient documentation

## 2014-10-24 DIAGNOSIS — R1010 Upper abdominal pain, unspecified: Secondary | ICD-10-CM | POA: Insufficient documentation

## 2014-10-24 DIAGNOSIS — M1990 Unspecified osteoarthritis, unspecified site: Secondary | ICD-10-CM | POA: Insufficient documentation

## 2014-10-24 DIAGNOSIS — K219 Gastro-esophageal reflux disease without esophagitis: Secondary | ICD-10-CM | POA: Insufficient documentation

## 2014-10-24 DIAGNOSIS — E119 Type 2 diabetes mellitus without complications: Secondary | ICD-10-CM | POA: Diagnosis not present

## 2014-10-24 DIAGNOSIS — Z79899 Other long term (current) drug therapy: Secondary | ICD-10-CM | POA: Diagnosis not present

## 2014-10-24 DIAGNOSIS — J449 Chronic obstructive pulmonary disease, unspecified: Secondary | ICD-10-CM | POA: Insufficient documentation

## 2014-10-24 DIAGNOSIS — I10 Essential (primary) hypertension: Secondary | ICD-10-CM | POA: Insufficient documentation

## 2014-10-24 DIAGNOSIS — M797 Fibromyalgia: Secondary | ICD-10-CM | POA: Diagnosis not present

## 2014-10-24 DIAGNOSIS — G4730 Sleep apnea, unspecified: Secondary | ICD-10-CM | POA: Diagnosis not present

## 2014-10-24 DIAGNOSIS — Z8673 Personal history of transient ischemic attack (TIA), and cerebral infarction without residual deficits: Secondary | ICD-10-CM | POA: Diagnosis not present

## 2014-10-24 DIAGNOSIS — Z95810 Presence of automatic (implantable) cardiac defibrillator: Secondary | ICD-10-CM | POA: Insufficient documentation

## 2014-10-24 DIAGNOSIS — R109 Unspecified abdominal pain: Secondary | ICD-10-CM | POA: Diagnosis not present

## 2014-10-24 DIAGNOSIS — R1110 Vomiting, unspecified: Secondary | ICD-10-CM

## 2014-10-24 HISTORY — DX: Personal history of other medical treatment: Z92.89

## 2014-10-24 HISTORY — PX: ESOPHAGOGASTRODUODENOSCOPY (EGD) WITH PROPOFOL: SHX5813

## 2014-10-24 LAB — GLUCOSE, CAPILLARY: Glucose-Capillary: 137 mg/dL — ABNORMAL HIGH (ref 65–99)

## 2014-10-24 SURGERY — ESOPHAGOGASTRODUODENOSCOPY (EGD) WITH PROPOFOL
Anesthesia: Monitor Anesthesia Care

## 2014-10-24 MED ORDER — LIDOCAINE HCL (CARDIAC) 20 MG/ML IV SOLN
INTRAVENOUS | Status: DC | PRN
  Administered 2014-10-24: 50 mg via INTRAVENOUS

## 2014-10-24 MED ORDER — SODIUM CHLORIDE 0.9 % IV SOLN: INTRAVENOUS | Status: DC

## 2014-10-24 MED ORDER — LIDOCAINE HCL (CARDIAC) 20 MG/ML IV SOLN
INTRAVENOUS | Status: AC
  Filled 2014-10-24: qty 5

## 2014-10-24 MED ORDER — PROPOFOL 10 MG/ML IV BOLUS
INTRAVENOUS | Status: AC
  Filled 2014-10-24: qty 20

## 2014-10-24 MED ORDER — LACTATED RINGERS IV SOLN
INTRAVENOUS | Status: DC
  Administered 2014-10-24: 12:00:00 via INTRAVENOUS
  Administered 2014-10-24: 125 mL/h via INTRAVENOUS

## 2014-10-24 MED ORDER — ONDANSETRON HCL 4 MG/2ML IJ SOLN
INTRAMUSCULAR | Status: DC | PRN
  Administered 2014-10-24: 4 mg via INTRAVENOUS

## 2014-10-24 MED ORDER — ONDANSETRON HCL 4 MG/2ML IJ SOLN
INTRAMUSCULAR | Status: AC
  Filled 2014-10-24: qty 2

## 2014-10-24 MED ORDER — PROPOFOL 10 MG/ML IV BOLUS
INTRAVENOUS | Status: DC | PRN
  Administered 2014-10-24 (×3): 50 mg via INTRAVENOUS

## 2014-10-24 SURGICAL SUPPLY — 14 items

## 2014-10-24 NOTE — Transfer of Care (Signed)
Immediate Anesthesia Transfer of Care Note  Patient: [name]  Procedure(s) Performed: Procedure(s): ESOPHAGOGASTRODUODENOSCOPY (EGD) WITH PROPOFOL (N/A)  Patient Location: PACU  Anesthesia Type:MAC  Level of Consciousness: awake, alert  and oriented  Airway & Oxygen Therapy: Patient Spontanous Breathing and Patient connected to nasal cannula oxygen  Post-op Assessment: Report given to RN and Post -op Vital signs reviewed and stable  Post vital signs: Reviewed and stable  Last Vitals:  Filed Vitals:   10/24/14 1010  BP: 134/74  Pulse: 108  Resp: 23    Complications: No apparent anesthesia complications

## 2014-10-24 NOTE — Anesthesia Postprocedure Evaluation (Signed)
  Anesthesia Post-op Note  Patient: [name]  Procedure(s) Performed: Procedure(s): ESOPHAGOGASTRODUODENOSCOPY (EGD) WITH PROPOFOL (N/A)  Patient Location: Endoscopy Unit  Anesthesia Type:MAC  Level of Consciousness: awake  Airway and Oxygen Therapy: Patient Spontanous Breathing  Post-op Pain: none  Post-op Assessment: Post-op Vital signs reviewed, Patient's Cardiovascular Status Stable, Respiratory Function Stable, Patent Airway, No signs of Nausea or vomiting and Pain level controlled              Post-op Vital Signs: Reviewed and stable  Last Vitals:  Filed Vitals:   10/24/14 1243  BP: 113/84  Pulse: 95  Temp:   Resp: 17    Complications: No apparent anesthesia complications

## 2014-10-24 NOTE — Discharge Instructions (Signed)
Esophagogastroduodenoscopy °Care After °Refer to this sheet in the next few weeks. These instructions provide you with information on caring for yourself after your procedure. Your caregiver may also give you more specific instructions. Your treatment has been planned according to current medical practices, but problems sometimes occur. Call your caregiver if you have any problems or questions after your procedure.  °HOME CARE INSTRUCTIONS °· Do not eat or drink anything until the numbing medicine (local anesthetic) has worn off and your gag reflex has returned. You will know that the local anesthetic has worn off when you can swallow comfortably. °· Do not drive for 12 hours after the procedure or as directed by your caregiver. °· Only take medicines as directed by your caregiver. °SEEK MEDICAL CARE IF:  °· You cannot stop coughing. °· You are not urinating at all or less than usual. °SEEK IMMEDIATE MEDICAL CARE IF: °· You have difficulty swallowing. °· You cannot eat or drink. °· You have worsening throat or chest pain. °· You have dizziness, lightheadedness, or you faint. °· You have nausea or vomiting. °· You have chills. °· You have a fever. °· You have severe abdominal pain. °· You have black, tarry, or bloody stools. °Document Released: 04/20/2012 Document Reviewed: 04/20/2012 °[employer]® Patient Information ©2015 [employer]. This information is not intended to replace advice given to you by your health care provider. Make sure you discuss any questions you have with your health care provider. ° °

## 2014-10-24 NOTE — Anesthesia Preprocedure Evaluation (Addendum)
Anesthesia Evaluation  Patient identified by MRN, date of birth, ID band Patient awake    Reviewed: Allergy & Precautions, NPO status , Patient's Chart, lab work & pertinent test results  History of Anesthesia Complications Negative for: history of anesthetic complications  Airway Mallampati: II  TM Distance: >3 FB Neck ROM: Full    Dental  (+) Teeth Intact   Pulmonary shortness of breath and with exertion, neg sleep apnea, COPD COPD inhaler, Recent URI , Resolved, neg PE breath sounds clear to auscultation        Cardiovascular hypertension, Pt. on medications + Cardiac Defibrillator Rhythm:Regular     Neuro/Psych Seizures -, Well Controlled,   Neuromuscular disease CVA, No Residual Symptoms negative psych ROS   GI/Hepatic Neg liver ROS, GERD-  Poorly Controlled,  Endo/Other  diabetes, Type 2, Insulin Dependent, Oral Hypoglycemic Agents  Renal/GU      Musculoskeletal  (+) Arthritis -, Fibromyalgia -  Abdominal   Peds  Hematology negative hematology ROS (+)   Anesthesia Other Findings sarcoidosis  Reproductive/Obstetrics                            Anesthesia Physical Anesthesia Plan  ASA: III  Anesthesia Plan: MAC   Post-op Pain Management:    Induction: Intravenous  Airway Management Planned: Simple Face Mask  Additional Equipment: None  Intra-op Plan:   Post-operative Plan:   Informed Consent: I have reviewed the patients History and Physical, chart, labs and discussed the procedure including the risks, benefits and alternatives for the proposed anesthesia with the patient or authorized representative who has indicated his/her understanding and acceptance.   Dental advisory given  Plan Discussed with: CRNA and Surgeon  Anesthesia Plan Comments:         Anesthesia Quick Evaluation

## 2014-10-24 NOTE — H&P (View-Only) (Signed)
      History of Present Illness:  [name] continues to complain of nausea and diffuse upper abdominal pain.  Symptoms are worsened postprandially.  She often will vomit within half hour of eating though this does not relieve her pain.  She denies pyrosis or dysphagia.  She continues to have mild chronic constipation.  Recent alkaline phosphatase was 417.  She has lost over 40 pounds in the past 8 months.  She complains of easy fatigability and lack of energy.  Appetite is poor.    Review of Systems: Pertinent positive and negative review of systems were noted in the above HPI section. All other review of systems were otherwise negative.    Current Medications, Allergies, Past Medical History, Past Surgical History, Family History and Social History were reviewed in [facility] record  Vital signs were reviewed in today's medical record. Physical Exam: General: Chronically ill-appearing female in no acute distress Skin: anicteric Head: Normocephalic and atraumatic Eyes:  sclerae anicteric, EOMI Ears: Normal auditory acuity Mouth: No deformity or lesions Lymph Nodes: no lymphadenopathy Lungs: Clear throughout to auscultation Heart: Regular rate and rhythm; no murmurs, rubs or brui: Gastroinestinal:  Soft,  and non distended. No masses, hepatosplenomegaly or hernias noted. Normal Bowel sounds.  Is mild tenderness to palpation throughout the epigastrium.  There is no succussion splash Rectal:deferred Musculoskeletal: Symmetrical with no gross deformities  Pulses:  Normal pulses noted Extremities: No clubbing, cyanosis, edema or deformities noted Neurological: Alert oriented x 4, grossly nonfocal Psychological:  Alert and cooperative. Normal mood and affect  See Assessment and Plan under Problem List

## 2014-10-24 NOTE — Interval H&P Note (Signed)
History and Physical Interval Note:  10/24/2014 12:02 PM  [name]  has presented today for surgery, with the diagnosis of nausea and vomiting  The various methods of treatment have been discussed with the patient and family. After consideration of risks, benefits and other options for treatment, the patient has consented to  Procedure(s): ESOPHAGOGASTRODUODENOSCOPY (EGD) WITH PROPOFOL (N/A) as a surgical intervention .  The patient's history has been reviewed, patient examined, no change in status, stable for surgery.  I have reviewed the patient's chart and labs.  Questions were answered to the patient's satisfaction.     The recent H&P (dated **10/22/14*) was reviewed, the patient was examined and there is no change in the patients condition since that H&P was completed.   Erskine Emery  10/24/2014, 12:02 PM   Erskine Emery

## 2014-10-24 NOTE — Op Note (Signed)
[facility] [place], [address]   ENDOSCOPY PROCEDURE REPORT  PATIENT: [name], [name]  MR#: [id number] BIRTHDATE: 11/28/1970 , 73  yrs. old GENDER: female ENDOSCOPIST: Inda Castle, MD REFERRED BY: PROCEDURE DATE:  10/24/2014 PROCEDURE:  EGD, diagnostic ASA CLASS: INDICATIONS:  nausea and vomiting. MEDICATIONS: Monitored anesthesia care TOPICAL ANESTHETIC:  DESCRIPTION OF PROCEDURE: After the risks benefits and alternatives of the procedure were thoroughly explained, informed consent was obtained.  The Pentax Gastroscope [id number] endoscope was introduced through the mouth and advanced to the third portion of the duodenum , Without limitations.  The instrument was slowly withdrawn as the mucosa was fully examined.      EXAM: The esophagus and gastroesophageal junction were completely normal in appearance.  The stomach was entered and closely examined.The antrum, angularis, and lesser curvature were well visualized, including a retroflexed view of the cardia and fundus. The stomach wall was normally distensable.  The scope passed easily through the pylorus into the duodenum.  Retroflexed views revealed no abnormalities.     The scope was then withdrawn from the patient and the procedure completed.  COMPLICATIONS: There were no immediate complications.  ENDOSCOPIC IMPRESSION: Normal appearing esophagus and GE junction, the stomach was well visualized and normal in appearance, normal appearing duodenum  RECOMMENDATIONS: CT scan; if negative, GES  REPEAT EXAM:  eSigned:  Inda Castle, MD 10/24/2014 12:20 PM    CC:

## 2014-10-25 ENCOUNTER — Encounter (HOSPITAL_COMMUNITY): Payer: Self-pay | Admitting: Gastroenterology

## 2014-10-25 ENCOUNTER — Ambulatory Visit (INDEPENDENT_AMBULATORY_CARE_PROVIDER_SITE_OTHER)
Admission: RE | Admit: 2014-10-25 | Discharge: 2014-10-25 | Disposition: A | Discharge status: Home / Self Care | Payer: Medicaid Other | Source: Ambulatory Visit | Attending: Gastroenterology | Admitting: Gastroenterology

## 2014-10-25 DIAGNOSIS — R112 Nausea with vomiting, unspecified: Secondary | ICD-10-CM

## 2014-10-25 DIAGNOSIS — R109 Unspecified abdominal pain: Secondary | ICD-10-CM

## 2014-10-26 ENCOUNTER — Other Ambulatory Visit: Payer: Self-pay

## 2014-10-26 DIAGNOSIS — R1110 Vomiting, unspecified: Secondary | ICD-10-CM

## 2014-11-05 ENCOUNTER — Ambulatory Visit (INDEPENDENT_AMBULATORY_CARE_PROVIDER_SITE_OTHER): Payer: Medicaid Other | Admitting: Critical Care Medicine

## 2014-11-05 ENCOUNTER — Encounter: Payer: Self-pay | Admitting: Critical Care Medicine

## 2014-11-05 VITALS — BP 124/76 | HR 117 | Temp 98.8°F | Wt 145.0 lb

## 2014-11-05 DIAGNOSIS — D869 Sarcoidosis, unspecified: Secondary | ICD-10-CM | POA: Diagnosis not present

## 2014-11-05 MED ORDER — HYDROCODONE-HOMATROPINE 5-1.5 MG/5ML PO SYRP: 5.0000 mL | ORAL_SOLUTION | Freq: Four times a day (QID) | ORAL | Status: DC | PRN

## 2014-11-05 NOTE — Progress Notes (Signed)
   Subjective:    Patient ID: [name], female    DOB: 1970/08/29, 44 y.o.   MRN: [id number]  HPI 11/05/2014 Chief Complaint  Patient presents with  . Follow-up    cough, feels like she can't catch breath at any time since last visit; fatigued; dizziness; stomach pain; feels worse than last visit   Pt lost more weight.  Gi saw pt,  Neg EGD.  Weight cont to fall, now down 20#.  Has planned hysterectomy this week. Pt denies any significant sore throat, nasal congestion or excess secretions, fever, chills, sweats, unintended weight loss, pleurtic or exertional chest pain, orthopnea PND, or leg swelling Pt denies any increase in rescue therapy over baseline, denies waking up needing it or having any early am or nocturnal exacerbations of coughing/wheezing/or dyspnea. Pt also denies any obvious fluctuation in symptoms with  weather or environmental change or other alleviating or aggravating factors    Current Medications, Allergies, Complete Past Medical History, Past Surgical History, Family History, and Social History were reviewed in [facility] record per todays encounter:  11/05/2014   Review of Systems  Constitutional: Positive for appetite change and unexpected weight change.  HENT: Negative.  Negative for ear pain, postnasal drip, rhinorrhea, sinus pressure, sore throat, trouble swallowing and voice change.   Eyes: Negative.   Respiratory: Negative.  Negative for apnea, cough, choking, chest tightness, shortness of breath, wheezing and stridor.   Cardiovascular: Negative.  Negative for chest pain, palpitations and leg swelling.  Gastrointestinal: Negative.  Negative for nausea, vomiting, abdominal pain and abdominal distention.  Genitourinary: Negative.   Musculoskeletal: Negative.  Negative for myalgias and arthralgias.  Skin: Negative.  Negative for rash.  Allergic/Immunologic: Negative.  Negative for environmental allergies and food allergies.   Neurological: Negative.  Negative for dizziness, syncope, weakness and headaches.  Hematological: Negative.  Negative for adenopathy. Does not bruise/bleed easily.  Psychiatric/Behavioral: Negative.  Negative for sleep disturbance and agitation. The patient is not nervous/anxious.        Objective:   Physical Exam Filed Vitals:   11/05/14 0953  BP: 124/76  Pulse: 117  Temp: 98.8 F (37.1 C)  TempSrc: Oral  Weight: 145 lb (65.772 kg)  SpO2: 98%    Gen: Pleasant, thin , in no distress,  normal affect  ENT: No lesions,  mouth clear,  oropharynx clear, no postnasal drip  Neck: No JVD, no TMG, no carotid bruits  Lungs: No use of accessory muscles, no dullness to percussion, clear without rales or rhonchi  Cardiovascular: RRR, heart sounds normal, no murmur or gallops, no peripheral edema  Abdomen: soft and NT, no HSM,  BS normal  Musculoskeletal: No deformities, no cyanosis or clubbing  Neuro: alert, non focal  Skin: Warm, no lesions or rashes  No results found.        Assessment & Plan:  I personally reviewed all images and lab data in the [facility] system as well as any outside material available during this office visit and agree with the  radiology impressions.   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  Diagnoses and all orders for this visit:  Sarcoidosis  Other orders -     HYDROcodone-homatropine (HYCODAN) 5-1.5 MG/5ML syrup; Take 5 mLs by mouth every 6 (six) hours as needed for cough.

## 2014-11-05 NOTE — Patient Instructions (Signed)
No change in medications. Return in        2 months 

## 2014-11-05 NOTE — Assessment & Plan Note (Signed)
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

## 2014-11-06 DIAGNOSIS — R9431 Abnormal electrocardiogram [ECG] [EKG]: Secondary | ICD-10-CM | POA: Insufficient documentation

## 2014-11-06 DIAGNOSIS — D869 Sarcoidosis, unspecified: Secondary | ICD-10-CM | POA: Insufficient documentation

## 2014-11-06 DIAGNOSIS — R578 Other shock: Secondary | ICD-10-CM | POA: Insufficient documentation

## 2014-11-06 DIAGNOSIS — J449 Chronic obstructive pulmonary disease, unspecified: Secondary | ICD-10-CM | POA: Insufficient documentation

## 2014-11-06 HISTORY — PX: EXPLORATORY LAPAROTOMY: SUR591

## 2014-11-06 HISTORY — PX: TOTAL ABDOMINAL HYSTERECTOMY W/ BILATERAL SALPINGOOPHORECTOMY: SHX83

## 2014-11-08 ENCOUNTER — Ambulatory Visit (HOSPITAL_COMMUNITY): Payer: Medicaid Other

## 2014-11-22 ENCOUNTER — Emergency Department (HOSPITAL_COMMUNITY): Payer: Medicaid Other

## 2014-11-22 ENCOUNTER — Encounter (HOSPITAL_COMMUNITY): Payer: Self-pay | Admitting: Emergency Medicine

## 2014-11-22 ENCOUNTER — Encounter: Payer: Medicaid Other | Admitting: Internal Medicine

## 2014-11-22 ENCOUNTER — Inpatient Hospital Stay (HOSPITAL_COMMUNITY)
Admission: EM | Admit: 2014-11-22 | Discharge: 2014-11-24 | DRG: 309 | Disposition: A | Discharge status: Home / Self Care | Payer: Medicaid Other | Attending: Internal Medicine | Admitting: Internal Medicine

## 2014-11-22 DIAGNOSIS — E1140 Type 2 diabetes mellitus with diabetic neuropathy, unspecified: Secondary | ICD-10-CM | POA: Diagnosis present

## 2014-11-22 DIAGNOSIS — E872 Acidosis: Secondary | ICD-10-CM | POA: Diagnosis present

## 2014-11-22 DIAGNOSIS — R1110 Vomiting, unspecified: Secondary | ICD-10-CM

## 2014-11-22 DIAGNOSIS — I472 Ventricular tachycardia: Principal | ICD-10-CM | POA: Diagnosis present

## 2014-11-22 DIAGNOSIS — E1159 Type 2 diabetes mellitus with other circulatory complications: Secondary | ICD-10-CM | POA: Diagnosis present

## 2014-11-22 DIAGNOSIS — M797 Fibromyalgia: Secondary | ICD-10-CM | POA: Diagnosis present

## 2014-11-22 DIAGNOSIS — Z8673 Personal history of transient ischemic attack (TIA), and cerebral infarction without residual deficits: Secondary | ICD-10-CM | POA: Diagnosis not present

## 2014-11-22 DIAGNOSIS — S0083XA Contusion of other part of head, initial encounter: Secondary | ICD-10-CM | POA: Diagnosis present

## 2014-11-22 DIAGNOSIS — S0093XA Contusion of unspecified part of head, initial encounter: Secondary | ICD-10-CM | POA: Diagnosis present

## 2014-11-22 DIAGNOSIS — R634 Abnormal weight loss: Secondary | ICD-10-CM | POA: Diagnosis present

## 2014-11-22 DIAGNOSIS — K219 Gastro-esophageal reflux disease without esophagitis: Secondary | ICD-10-CM | POA: Diagnosis not present

## 2014-11-22 DIAGNOSIS — Z794 Long term (current) use of insulin: Secondary | ICD-10-CM

## 2014-11-22 DIAGNOSIS — D869 Sarcoidosis, unspecified: Secondary | ICD-10-CM | POA: Diagnosis present

## 2014-11-22 DIAGNOSIS — I10 Essential (primary) hypertension: Secondary | ICD-10-CM | POA: Diagnosis present

## 2014-11-22 DIAGNOSIS — I4901 Ventricular fibrillation: Secondary | ICD-10-CM

## 2014-11-22 DIAGNOSIS — I4721 Torsades de pointes: Secondary | ICD-10-CM

## 2014-11-22 DIAGNOSIS — W1830XA Fall on same level, unspecified, initial encounter: Secondary | ICD-10-CM | POA: Diagnosis present

## 2014-11-22 DIAGNOSIS — I4581 Long QT syndrome: Secondary | ICD-10-CM | POA: Diagnosis not present

## 2014-11-22 DIAGNOSIS — K589 Irritable bowel syndrome without diarrhea: Secondary | ICD-10-CM | POA: Diagnosis present

## 2014-11-22 DIAGNOSIS — R112 Nausea with vomiting, unspecified: Secondary | ICD-10-CM

## 2014-11-22 DIAGNOSIS — K5901 Slow transit constipation: Secondary | ICD-10-CM | POA: Diagnosis present

## 2014-11-22 DIAGNOSIS — S0990XA Unspecified injury of head, initial encounter: Secondary | ICD-10-CM

## 2014-11-22 DIAGNOSIS — Z95810 Presence of automatic (implantable) cardiac defibrillator: Secondary | ICD-10-CM | POA: Diagnosis present

## 2014-11-22 DIAGNOSIS — R748 Abnormal levels of other serum enzymes: Secondary | ICD-10-CM | POA: Diagnosis not present

## 2014-11-22 DIAGNOSIS — L8990 Pressure ulcer of unspecified site, unspecified stage: Secondary | ICD-10-CM | POA: Insufficient documentation

## 2014-11-22 DIAGNOSIS — R55 Syncope and collapse: Secondary | ICD-10-CM

## 2014-11-22 DIAGNOSIS — E876 Hypokalemia: Secondary | ICD-10-CM | POA: Diagnosis present

## 2014-11-22 DIAGNOSIS — E119 Type 2 diabetes mellitus without complications: Secondary | ICD-10-CM

## 2014-11-22 DIAGNOSIS — I152 Hypertension secondary to endocrine disorders: Secondary | ICD-10-CM | POA: Diagnosis present

## 2014-11-22 LAB — CBC WITH DIFFERENTIAL/PLATELET
BASOS ABS: 0 10*3/uL (ref 0.0–0.1)
BASOS PCT: 0 % (ref 0–1)
EOS ABS: 0.1 10*3/uL (ref 0.0–0.7)
Eosinophils Relative: 0 % (ref 0–5)
HCT: 34.7 % — ABNORMAL LOW (ref 36.0–46.0)
Hemoglobin: 11.4 g/dL — ABNORMAL LOW (ref 12.0–15.0)
Lymphocytes Relative: 13 % (ref 12–46)
Lymphs Abs: 1.6 10*3/uL (ref 0.7–4.0)
MCH: 27.8 pg (ref 26.0–34.0)
MCHC: 32.9 g/dL (ref 30.0–36.0)
MCV: 84.6 fL (ref 78.0–100.0)
Monocytes Absolute: 1.2 10*3/uL — ABNORMAL HIGH (ref 0.1–1.0)
Monocytes Relative: 9 % (ref 3–12)
NEUTROS ABS: 9.4 10*3/uL — AB (ref 1.7–7.7)
NEUTROS PCT: 78 % — AB (ref 43–77)
PLATELETS: 154 10*3/uL (ref 150–400)
RBC: 4.1 MIL/uL (ref 3.87–5.11)
RDW: 16.2 % — ABNORMAL HIGH (ref 11.5–15.5)
WBC: 12.2 10*3/uL — ABNORMAL HIGH (ref 4.0–10.5)

## 2014-11-22 LAB — BASIC METABOLIC PANEL
Anion gap: 8 (ref 5–15)
BUN: <5 mg/dL — ABNORMAL LOW (ref 6–20)
CHLORIDE: 97 mmol/L — AB (ref 101–111)
CO2: 29 mmol/L (ref 22–32)
CREATININE: 0.51 mg/dL (ref 0.44–1.00)
Calcium: 8.1 mg/dL — ABNORMAL LOW (ref 8.9–10.3)
GFR calc Af Amer: >60 mL/min (ref >60)
GFR calc non Af Amer: >60 mL/min (ref >60)
Glucose, Bld: 82 mg/dL (ref 65–99)
Potassium: 3.5 mmol/L (ref 3.5–5.1)
SODIUM: 134 mmol/L — AB (ref 135–145)

## 2014-11-22 LAB — COMPREHENSIVE METABOLIC PANEL
ALT: 13 U/L — AB (ref 14–54)
AST: 36 U/L (ref 15–41)
Albumin: 2.1 g/dL — ABNORMAL LOW (ref 3.5–5.0)
Alkaline Phosphatase: 570 U/L — ABNORMAL HIGH (ref 38–126)
Anion gap: 10 (ref 5–15)
BILIRUBIN TOTAL: 1.5 mg/dL — AB (ref 0.3–1.2)
BUN: <5 mg/dL — ABNORMAL LOW (ref 6–20)
CALCIUM: 8.2 mg/dL — AB (ref 8.9–10.3)
CO2: 27 mmol/L (ref 22–32)
Chloride: 94 mmol/L — ABNORMAL LOW (ref 101–111)
Creatinine, Ser: 0.47 mg/dL (ref 0.44–1.00)
GFR calc Af Amer: >60 mL/min (ref >60)
GLUCOSE: 81 mg/dL (ref 65–99)
Potassium: 2.8 mmol/L — ABNORMAL LOW (ref 3.5–5.1)
SODIUM: 131 mmol/L — AB (ref 135–145)
Total Protein: 8.2 g/dL — ABNORMAL HIGH (ref 6.5–8.1)

## 2014-11-22 LAB — URINALYSIS, ROUTINE W REFLEX MICROSCOPIC
Bilirubin Urine: NEGATIVE
Glucose, UA: NEGATIVE mg/dL
Hgb urine dipstick: NEGATIVE
Ketones, ur: NEGATIVE mg/dL
LEUKOCYTES UA: NEGATIVE
Nitrite: NEGATIVE
Protein, ur: NEGATIVE mg/dL
SPECIFIC GRAVITY, URINE: 1.005 (ref 1.005–1.030)
UROBILINOGEN UA: 1 mg/dL (ref 0.0–1.0)
pH: 8 (ref 5.0–8.0)

## 2014-11-22 LAB — GLUCOSE, CAPILLARY
GLUCOSE-CAPILLARY: 123 mg/dL — AB (ref 65–99)
Glucose-Capillary: 70 mg/dL (ref 65–99)

## 2014-11-22 LAB — MRSA PCR SCREENING: MRSA by PCR: NEGATIVE

## 2014-11-22 LAB — I-STAT CG4 LACTIC ACID, ED: LACTIC ACID, VENOUS: 2.09 mmol/L — AB (ref 0.5–2.0)

## 2014-11-22 LAB — TSH: TSH: 1.159 u[IU]/mL (ref 0.350–4.500)

## 2014-11-22 LAB — MAGNESIUM: Magnesium: 1.9 mg/dL (ref 1.7–2.4)

## 2014-11-22 MED ORDER — ENSURE ENLIVE PO LIQD: 237.0000 mL | Freq: Two times a day (BID) | ORAL | Status: DC

## 2014-11-22 MED ORDER — OLOPATADINE HCL 0.1 % OP SOLN
1.0000 [drp] | Freq: Two times a day (BID) | OPHTHALMIC | Status: DC
  Administered 2014-11-22: 1 [drp] via OPHTHALMIC
  Filled 2014-11-22: qty 5

## 2014-11-22 MED ORDER — BUDESONIDE 0.5 MG/2ML IN SUSP
0.5000 mg | Freq: Two times a day (BID) | RESPIRATORY_TRACT | Status: DC
  Administered 2014-11-22 – 2014-11-24 (×3): 0.5 mg via RESPIRATORY_TRACT
  Filled 2014-11-22 (×6): qty 2

## 2014-11-22 MED ORDER — LUBIPROSTONE 24 MCG PO CAPS
24.0000 ug | ORAL_CAPSULE | Freq: Two times a day (BID) | ORAL | Status: DC
  Administered 2014-11-23: 24 ug via ORAL
  Filled 2014-11-22 (×5): qty 1

## 2014-11-22 MED ORDER — PANTOPRAZOLE SODIUM 40 MG PO TBEC
40.0000 mg | DELAYED_RELEASE_TABLET | Freq: Every day | ORAL | Status: DC
  Administered 2014-11-23 – 2014-11-24 (×2): 40 mg via ORAL
  Filled 2014-11-22 (×2): qty 1

## 2014-11-22 MED ORDER — MAGNESIUM SULFATE 2 GM/50ML IV SOLN
2.0000 g | Freq: Once | INTRAVENOUS | Status: AC
  Administered 2014-11-22: 2 g via INTRAVENOUS
  Filled 2014-11-22: qty 50

## 2014-11-22 MED ORDER — INSULIN ASPART 100 UNIT/ML ~~LOC~~ SOLN: 0.0000 [IU] | Freq: Three times a day (TID) | SUBCUTANEOUS | Status: DC

## 2014-11-22 MED ORDER — GABAPENTIN 100 MG PO CAPS
100.0000 mg | ORAL_CAPSULE | Freq: Three times a day (TID) | ORAL | Status: DC
  Administered 2014-11-22 – 2014-11-24 (×5): 100 mg via ORAL
  Filled 2014-11-22 (×6): qty 1

## 2014-11-22 MED ORDER — POTASSIUM CHLORIDE 10 MEQ/100ML IV SOLN
10.0000 meq | INTRAVENOUS | Status: DC
  Administered 2014-11-22: 10 meq via INTRAVENOUS
  Filled 2014-11-22 (×3): qty 100

## 2014-11-22 MED ORDER — CYCLOSPORINE 0.05 % OP EMUL
1.0000 [drp] | Freq: Two times a day (BID) | OPHTHALMIC | Status: DC
  Administered 2014-11-22 – 2014-11-24 (×2): 1 [drp] via OPHTHALMIC
  Filled 2014-11-22 (×5): qty 1

## 2014-11-22 MED ORDER — POTASSIUM CHLORIDE 10 MEQ/100ML IV SOLN
10.0000 meq | INTRAVENOUS | Status: AC
  Administered 2014-11-22 (×3): 10 meq via INTRAVENOUS
  Filled 2014-11-22 (×3): qty 100

## 2014-11-22 MED ORDER — ONDANSETRON HCL 4 MG PO TABS: 4.0000 mg | ORAL_TABLET | Freq: Three times a day (TID) | ORAL | Status: DC | PRN

## 2014-11-22 MED ORDER — TRAMADOL HCL 50 MG PO TABS
50.0000 mg | ORAL_TABLET | Freq: Four times a day (QID) | ORAL | Status: DC | PRN
  Administered 2014-11-22 – 2014-11-24 (×2): 50 mg via ORAL
  Filled 2014-11-22 (×2): qty 1

## 2014-11-22 MED ORDER — SODIUM CHLORIDE 0.9 % IV SOLN
Freq: Once | INTRAVENOUS | Status: AC
  Administered 2014-11-22: 10:00:00 via INTRAVENOUS

## 2014-11-22 MED ORDER — INSULIN GLARGINE 100 UNIT/ML ~~LOC~~ SOLN
35.0000 [IU] | Freq: Every day | SUBCUTANEOUS | Status: DC
  Administered 2014-11-23: 35 [IU] via SUBCUTANEOUS
  Filled 2014-11-22 (×3): qty 0.35

## 2014-11-22 MED ORDER — ATENOLOL 25 MG PO TABS
50.0000 mg | ORAL_TABLET | Freq: Two times a day (BID) | ORAL | Status: DC
  Administered 2014-11-22 – 2014-11-24 (×4): 50 mg via ORAL
  Filled 2014-11-22: qty 2
  Filled 2014-11-22: qty 1
  Filled 2014-11-22: qty 2
  Filled 2014-11-22 (×2): qty 1

## 2014-11-22 MED ORDER — FLUTICASONE PROPIONATE 50 MCG/ACT NA SUSP
2.0000 | Freq: Every day | NASAL | Status: DC
  Filled 2014-11-22: qty 16

## 2014-11-22 MED ORDER — CETYLPYRIDINIUM CHLORIDE 0.05 % MT LIQD
7.0000 mL | Freq: Two times a day (BID) | OROMUCOSAL | Status: DC
  Administered 2014-11-23: 7 mL via OROMUCOSAL

## 2014-11-22 MED ORDER — BECLOMETHASONE DIPROPIONATE 80 MCG/ACT IN AERS: 2.0000 | INHALATION_SPRAY | Freq: Two times a day (BID) | RESPIRATORY_TRACT | Status: DC

## 2014-11-22 NOTE — H&P (Signed)
Cardiologist:  Lovena Le GI: [name] is an 44 y.o. female.   Chief Complaint:  SOB/AICD discharge HPI:   [name] is a pleasant 44 year old woman with a history of long QT syndrome and a very strong family history of sudden cardiac death, status post ICD implantation. She also has a history of SVT and is status post catheter ablation.  The family has known LQTS 2.  The patient was on her way to her clinic appt.with Dr. Lovena Le this morning.  While she was in the car she began to feel short of breath.  She waited a few minutes then started to walk into the building.  As she approached the elevator the SOB go worse.  The next thing she remembers is being on the floor.  She hit her head and has a large hematoma on her head with no acute intracranial or cervical spine injury.  She has know elevation of Alkphos and reports decreased appetite, which has been for several months resulting in 40lb wt loss.  She is supposed to have a gastric emptying scan ordered by Dr. Deatra Ina.   Medications  Medication Sig  insulin aspart (NOVOLOG) 100 UNIT/ML injection Inject 4-8 Units into the skin 3 (three) times daily before meals. Sliding scale  insulin glargine (LANTUS) 100 UNIT/ML injection Inject 35 Units into the skin at bedtime.   atenolol (TENORMIN) 50 MG tablet Take 1 tablet (50 mg total) by mouth 2 (two) times daily.  beclomethasone (QVAR) 80 MCG/ACT inhaler Inhale 2 puffs into the lungs 2 (two) times daily.  betamethasone dipropionate (DIPROLENE) 0.05 % cream Apply topically 2 (two) times daily.  cetirizine (ZYRTEC) 10 MG tablet Take 10 mg by mouth daily as needed for allergies.   clonazePAM (KLONOPIN) 0.5 MG tablet Take 0.5 mg by mouth daily.  cyclobenzaprine (FLEXERIL) 10 MG tablet Take 10 mg by mouth 3 (three) times daily as needed for muscle spasms.   cycloSPORINE (RESTASIS) 0.05 % ophthalmic emulsion Place 1 drop into both eyes 2 (two) times daily.   diclofenac sodium (VOLTAREN) 1 %  GEL Apply 2 g topically 2 (two) times daily as needed (pain).   fluticasone (FLONASE) 50 MCG/ACT nasal spray Place 2 sprays into both nostrils daily.  gabapentin (NEURONTIN) 100 MG capsule Take 1 capsule (100 mg total) by mouth 3 (three) times daily.  glimepiride (AMARYL) 2 MG tablet Take 2 mg by mouth 2 (two) times daily at 8 am and 10 pm.   HYDROcodone-homatropine (HYCODAN) 5-1.5 MG/5ML syrup Take 5 mLs by mouth every 6 (six) hours as needed for cough.  lubiprostone (AMITIZA) 8 MCG capsule Take 3 capsules (24 mcg total) by mouth 2 (two) times daily with a meal.  Olopatadine HCl (PATADAY) 0.2 % SOLN Place 1 drop into both eyes 2 (two) times daily.   ondansetron (ZOFRAN) 4 MG tablet Take 1 tablet (4 mg total) by mouth every 8 (eight) hours as needed for nausea or vomiting.     Past Medical History  Diagnosis Date  . Long Q-T syndrome   . Palpitation   . Seizure disorder     none recently  . Abdominal pain, periumbilic   . Esophageal stricture   . Sarcoidosis   . Allergic rhinitis   . Bronchitis   . Sarcoidosis   . GERD (gastroesophageal reflux disease)   . Asthma   . Fibromyalgia   . Stroke     QUESTIONABLE TIA   . Neuropathy in diabetes   . Seizures  HISTORY OF TAKING KLONIPIN NOT RX AT THE MOMENT  . Cancer     Nodules in lungs pt believes were cancerous pt unsure  . Arthritis   . Internal hemorrhoids   . Eye hemorrhage     bilateral  . Fibroid, uterine   . Automatic implantable cardioverter-defibrillator in situ 2006, replaced 2008    MEDTRONIC  . DM (diabetes mellitus)     type 2  . History of blood transfusion years ago    Past Surgical History  Procedure Laterality Date  . Tubal ligation    . Hand surgery    . Svt ablation    . Automatic implantable cardiac defibrillator situ      ICD-Medtronic   Remote - Yes   . Hemorrhoid banding    . Dilitation & currettage/hystroscopy with hydrothermal ablation N/A 06/07/2013    Procedure: DILATATION &  CURETTAGE/HYSTEROSCOPY WITH attempted HYDROTHERMAL ABLATION;  Surgeon: Frederico Hamman, MD;  Location: [facility];  Service: Gynecology;  Laterality: N/A;  . Esophagogastroduodenoscopy (egd) with propofol N/A 10/24/2014    Procedure: ESOPHAGOGASTRODUODENOSCOPY (EGD) WITH PROPOFOL;  Surgeon: Inda Castle, MD;  Location: [facility];  Service: Endoscopy;  Laterality: N/A;    Family History  Problem Relation Age of Onset  . Cancer    . Diabetes     Social History:  reports that she has never smoked. She has never used smokeless tobacco. She reports that she drinks alcohol. She reports that she does not use illicit drugs.  Allergies:  Allergies  Allergen Reactions  . Contrast Media [Iodinated Diagnostic Agents] Anaphylaxis and Shortness Of Breath    Needed to be defibrillated   . Augmentin [Amoxicillin-Pot Clavulanate] Other (See Comments)    "stomach hurt"  . Codeine Nausea And Vomiting    jittery  . Iohexol      Code: RASH, Desc: PT STATES SHE IS ALLERGIC TO IV CONTRAST 05/28/06/RM, Onset Date: [id number]   . Meperidine Hcl Nausea And Vomiting  . Morphine And Related Nausea And Vomiting     (Not in a hospital admission)  Results for orders placed or performed during the hospital encounter of 11/22/14 (from the past 48 hour(s))  Comprehensive metabolic panel     Status: Abnormal   Collection Time: 11/22/14 10:21 AM  Result Value Ref Range   Sodium 131 (L) 135 - 145 mmol/L   Potassium 2.8 (L) 3.5 - 5.1 mmol/L   Chloride 94 (L) 101 - 111 mmol/L   CO2 27 22 - 32 mmol/L   Glucose, Bld 81 65 - 99 mg/dL   BUN <5 (L) 6 - 20 mg/dL   Creatinine, Ser 0.47 0.44 - 1.00 mg/dL   Calcium 8.2 (L) 8.9 - 10.3 mg/dL   Total Protein 8.2 (H) 6.5 - 8.1 g/dL   Albumin 2.1 (L) 3.5 - 5.0 g/dL   AST 36 15 - 41 U/L   ALT 13 (L) 14 - 54 U/L   Alkaline Phosphatase 570 (H) 38 - 126 U/L   Total Bilirubin 1.5 (H) 0.3 - 1.2 mg/dL   GFR calc non Af Amer >60 >60 mL/min   GFR calc Af Amer >60 >60 mL/min     Comment: (NOTE) The eGFR has been calculated using the CKD EPI equation. This calculation has not been validated in all clinical situations. eGFR's persistently <60 mL/min signify possible Chronic Kidney Disease.    Anion gap 10 5 - 15  CBC with Differential     Status: Abnormal   Collection Time: 11/22/14  10:21 AM  Result Value Ref Range   WBC 12.2 (H) 4.0 - 10.5 K/uL   RBC 4.10 3.87 - 5.11 MIL/uL   Hemoglobin 11.4 (L) 12.0 - 15.0 g/dL   HCT 34.7 (L) 36.0 - 46.0 %   MCV 84.6 78.0 - 100.0 fL   MCH 27.8 26.0 - 34.0 pg   MCHC 32.9 30.0 - 36.0 g/dL   RDW 16.2 (H) 11.5 - 15.5 %   Platelets 154 150 - 400 K/uL   Neutrophils Relative % 78 (H) 43 - 77 %   Neutro Abs 9.4 (H) 1.7 - 7.7 K/uL   Lymphocytes Relative 13 12 - 46 %   Lymphs Abs 1.6 0.7 - 4.0 K/uL   Monocytes Relative 9 3 - 12 %   Monocytes Absolute 1.2 (H) 0.1 - 1.0 K/uL   Eosinophils Relative 0 0 - 5 %   Eosinophils Absolute 0.1 0.0 - 0.7 K/uL   Basophils Relative 0 0 - 1 %   Basophils Absolute 0.0 0.0 - 0.1 K/uL  I-Stat CG4 Lactic Acid, ED     Status: Abnormal   Collection Time: 11/22/14 10:30 AM  Result Value Ref Range   Lactic Acid, Venous 2.09 (HH) 0.5 - 2.0 mmol/L   Comment NOTIFIED PHYSICIAN   Urinalysis, Routine w reflex microscopic (not at [facility])     Status: None   Collection Time: 11/22/14 11:43 AM  Result Value Ref Range   Color, Urine YELLOW YELLOW   APPearance CLEAR CLEAR   Specific Gravity, Urine 1.005 1.005 - 1.030   pH 8.0 5.0 - 8.0   Glucose, UA NEGATIVE NEGATIVE mg/dL   Hgb urine dipstick NEGATIVE NEGATIVE   Bilirubin Urine NEGATIVE NEGATIVE   Ketones, ur NEGATIVE NEGATIVE mg/dL   Protein, ur NEGATIVE NEGATIVE mg/dL   Urobilinogen, UA 1.0 0.0 - 1.0 mg/dL   Nitrite NEGATIVE NEGATIVE   Leukocytes, UA NEGATIVE NEGATIVE    Comment: MICROSCOPIC NOT DONE ON URINES WITH NEGATIVE PROTEIN, BLOOD, LEUKOCYTES, NITRITE, OR GLUCOSE <1000 mg/dL.   Ct Head Wo Contrast  11/22/2014   CLINICAL DATA:  Fall, syncope  and, frontal hematoma  EXAM: CT HEAD WITHOUT CONTRAST  CT MAXILLOFACIAL WITHOUT CONTRAST  CT CERVICAL SPINE WITHOUT CONTRAST  TECHNIQUE: Multidetector CT imaging of the head, cervical spine, and maxillofacial structures were performed using the standard protocol without intravenous contrast. Multiplanar CT image reconstructions of the cervical spine and maxillofacial structures were also generated.  COMPARISON:  09/17/2008  FINDINGS: CT HEAD FINDINGS  No skull fracture is noted. There is scalp swelling in right frontal region. Small subcutaneous hematoma right frontal scalp measures 2 cm length by 4 mm thickness.  No intracranial hemorrhage, mass effect or midline shift. No acute cortical infarction. No mass lesion is noted on this unenhanced scan. No intraventricular hemorrhage.  CT MAXILLOFACIAL FINDINGS  There is mucosal thickening with partial opacification bilateral ethmoid air cells. Mild mucosal thickening bilateral maxillary sinus and bilateral frontal sinus. There is bubbly opacification of the left maxillary sinus. Sinusitis cannot be excluded.  The mastoid air cells are unremarkable.  There is no nasal bone fracture. No intraorbital hematoma. No zygomatic fracture. No facial fluid collection. Bilateral eye globes are symmetrical in appearance. Coronal images shows no orbital floor or orbital rim fracture. There is mild left deviation of nasal bony septum. The nasal turbinates are unremarkable.  No mandibular fracture is noted.  There is no TMJ dislocation.  Sagittal images shows no maxillary spine fracture. The nasopharyngeal and oropharyngeal airway is patent.  CT CERVICAL SPINE FINDINGS  Axial images of the cervical spine shows no acute fracture or subluxation. Computer processed images shows no acute fracture or subluxation. Mild degenerative changes are noted C1-C2 articulation. Alignment, and vertebral body heights are preserved. No prevertebral soft tissue swelling. Cervical airway is patent.   IMPRESSION: 1. There is no acute intracranial abnormality. 2. Paranasal sinuses disease as described above. 3. There is scalp swelling and small scalp hematoma in right frontal region. 4. No orbital rim or orbital floor fracture. No facial fluid collection. Patent nasopharyngeal and oropharyngeal airway. 5. No mandibular fracture. No zygomatic fracture. No intraorbital hematoma. No TMJ dislocation. 6. No cervical spine acute fracture or subluxation. Mild degenerative changes C1-C2 articulation.   Electronically Signed   By: Lahoma Crocker M.D.   On: 11/22/2014 11:35   Ct Cervical Spine Wo Contrast  11/22/2014   CLINICAL DATA:  Fall, syncope and, frontal hematoma  EXAM: CT HEAD WITHOUT CONTRAST  CT MAXILLOFACIAL WITHOUT CONTRAST  CT CERVICAL SPINE WITHOUT CONTRAST  TECHNIQUE: Multidetector CT imaging of the head, cervical spine, and maxillofacial structures were performed using the standard protocol without intravenous contrast. Multiplanar CT image reconstructions of the cervical spine and maxillofacial structures were also generated.  COMPARISON:  09/17/2008  FINDINGS: CT HEAD FINDINGS  No skull fracture is noted. There is scalp swelling in right frontal region. Small subcutaneous hematoma right frontal scalp measures 2 cm length by 4 mm thickness.  No intracranial hemorrhage, mass effect or midline shift. No acute cortical infarction. No mass lesion is noted on this unenhanced scan. No intraventricular hemorrhage.  CT MAXILLOFACIAL FINDINGS  There is mucosal thickening with partial opacification bilateral ethmoid air cells. Mild mucosal thickening bilateral maxillary sinus and bilateral frontal sinus. There is bubbly opacification of the left maxillary sinus. Sinusitis cannot be excluded.  The mastoid air cells are unremarkable.  There is no nasal bone fracture. No intraorbital hematoma. No zygomatic fracture. No facial fluid collection. Bilateral eye globes are symmetrical in appearance. Coronal images shows no  orbital floor or orbital rim fracture. There is mild left deviation of nasal bony septum. The nasal turbinates are unremarkable.  No mandibular fracture is noted.  There is no TMJ dislocation.  Sagittal images shows no maxillary spine fracture. The nasopharyngeal and oropharyngeal airway is patent.  CT CERVICAL SPINE FINDINGS  Axial images of the cervical spine shows no acute fracture or subluxation. Computer processed images shows no acute fracture or subluxation. Mild degenerative changes are noted C1-C2 articulation. Alignment, and vertebral body heights are preserved. No prevertebral soft tissue swelling. Cervical airway is patent.  IMPRESSION: 1. There is no acute intracranial abnormality. 2. Paranasal sinuses disease as described above. 3. There is scalp swelling and small scalp hematoma in right frontal region. 4. No orbital rim or orbital floor fracture. No facial fluid collection. Patent nasopharyngeal and oropharyngeal airway. 5. No mandibular fracture. No zygomatic fracture. No intraorbital hematoma. No TMJ dislocation. 6. No cervical spine acute fracture or subluxation. Mild degenerative changes C1-C2 articulation.   Electronically Signed   By: Lahoma Crocker M.D.   On: 11/22/2014 11:35   Ct Maxillofacial Wo Cm  11/22/2014   CLINICAL DATA:  Fall, syncope and, frontal hematoma  EXAM: CT HEAD WITHOUT CONTRAST  CT MAXILLOFACIAL WITHOUT CONTRAST  CT CERVICAL SPINE WITHOUT CONTRAST  TECHNIQUE: Multidetector CT imaging of the head, cervical spine, and maxillofacial structures were performed using the standard protocol without intravenous contrast. Multiplanar CT image reconstructions of the cervical spine and maxillofacial structures  were also generated.  COMPARISON:  09/17/2008  FINDINGS: CT HEAD FINDINGS  No skull fracture is noted. There is scalp swelling in right frontal region. Small subcutaneous hematoma right frontal scalp measures 2 cm length by 4 mm thickness.  No intracranial hemorrhage, mass effect or  midline shift. No acute cortical infarction. No mass lesion is noted on this unenhanced scan. No intraventricular hemorrhage.  CT MAXILLOFACIAL FINDINGS  There is mucosal thickening with partial opacification bilateral ethmoid air cells. Mild mucosal thickening bilateral maxillary sinus and bilateral frontal sinus. There is bubbly opacification of the left maxillary sinus. Sinusitis cannot be excluded.  The mastoid air cells are unremarkable.  There is no nasal bone fracture. No intraorbital hematoma. No zygomatic fracture. No facial fluid collection. Bilateral eye globes are symmetrical in appearance. Coronal images shows no orbital floor or orbital rim fracture. There is mild left deviation of nasal bony septum. The nasal turbinates are unremarkable.  No mandibular fracture is noted.  There is no TMJ dislocation.  Sagittal images shows no maxillary spine fracture. The nasopharyngeal and oropharyngeal airway is patent.  CT CERVICAL SPINE FINDINGS  Axial images of the cervical spine shows no acute fracture or subluxation. Computer processed images shows no acute fracture or subluxation. Mild degenerative changes are noted C1-C2 articulation. Alignment, and vertebral body heights are preserved. No prevertebral soft tissue swelling. Cervical airway is patent.  IMPRESSION: 1. There is no acute intracranial abnormality. 2. Paranasal sinuses disease as described above. 3. There is scalp swelling and small scalp hematoma in right frontal region. 4. No orbital rim or orbital floor fracture. No facial fluid collection. Patent nasopharyngeal and oropharyngeal airway. 5. No mandibular fracture. No zygomatic fracture. No intraorbital hematoma. No TMJ dislocation. 6. No cervical spine acute fracture or subluxation. Mild degenerative changes C1-C2 articulation.   Electronically Signed   By: Lahoma Crocker M.D.   On: 11/22/2014 11:35    Review of Systems  Constitutional: Negative for fever and diaphoresis.  HENT: Negative for  congestion and sore throat.   Respiratory: Positive for shortness of breath. Negative for cough.   Cardiovascular: Positive for palpitations. Negative for chest pain, orthopnea and leg swelling.  Gastrointestinal: Positive for abdominal pain (Partial hysterectomy on 6/21.). Negative for nausea and vomiting.  Genitourinary: Negative for hematuria.  Musculoskeletal:       Head pain.  Neurological: Positive for dizziness.  All other systems reviewed and are negative.   Blood pressure 152/85, pulse 107, temperature 99.3 F (37.4 C), temperature source Oral, resp. rate 18, SpO2 95 %. Physical Exam  Nursing note and vitals reviewed. Constitutional: She is oriented to person, place, and time. She appears well-developed and well-nourished. No distress.  HENT:  Head: Normocephalic.  Mouth/Throat: No oropharyngeal exudate.  Large hematoma on the right frontal lobe.   Eyes: EOM are normal. Pupils are equal, round, and reactive to light. No scleral icterus.  Neck: Normal range of motion. Neck supple.  Cardiovascular: Regular rhythm, S1 normal and S2 normal.  Tachycardia present.   No murmur heard. Pulses:      Radial pulses are 2+ on the right side, and 2+ on the left side.       Dorsalis pedis pulses are 2+ on the right side, and 2+ on the left side.  Respiratory: Effort normal and breath sounds normal. She has no wheezes. She has no rales.  GI: Soft. Bowel sounds are normal. There is tenderness (right lower).  Musculoskeletal: She exhibits no edema.  Lymphadenopathy:  She has no cervical adenopathy.  Neurological: She is alert and oriented to person, place, and time. She exhibits normal muscle tone.  Skin: Skin is warm and dry.  Psychiatric: She has a normal mood and affect.     Assessment/Plan [place] This is likley the result of poor nutritional intake resulting in electrolyte abnormalities.  Potassium 2.8.  Six runs 3mq IV ordered.  Checking stat magnesium and giving 2g IV.  No  acute intracranial or cervical spine injury from fall.  Large right frontal hematoma.  Will ask for nutritional consult.    Hypokalemia  Replace with IV.  Elevated Alkphos   Followed by Dr. KDeatra Ina  Due for gastric emptying scan.   Diabetes mellitus, type 2  lantus and SS insulin  Essential hypertension    Mildly elevated.  SYNDROME, LONG QT  QTc 461    Automatic implantable cardioverter-defibrillator in situ  Three ICD discharges today and one on the 15th of June.   Traumatic hematoma of head  Ice  Pack and tramadol.    HTarri Fuller PGarland7/11/2014, 12:34 PM   VT-PM in setting of LQT2 and hypoklaemia  Cause of which not clear--vomiting not normally a trigger for hypokalemia, but diarrhea not a big problem.  Low cal diets can be problem,  Is her poor nutrition enough of an issue.  She is hyertensive  Should not be renin issue but will check Replete K  [email or web address] website education  ? GI input as to N V and progrfeesive weight loss

## 2014-11-22 NOTE — Progress Notes (Signed)
HPI [name] returns today for followup. She is a pleasant 44 year old woman with a history of long QT syndrome and a very strong family history of sudden cardiac death, status post ICD implantation. She also has a history of SVT and is status post catheter ablation. In the interim, she has been stable. Her only complaint today is that of trouble sleeping. She states that she has trouble getting to sleep and staying asleep. No syncope. Her daughter has recently come to medical attention when she presented pregnant with syncope and subsequently delivered and had recurrent syncope and was found to have episodes of PMVT. The family has known LQTS 2. No peripheral edema. No chest pain or shortness of breath. She has tolerated her beta blocker without trouble. She c/o pruritus around her ICD insertion. She also has chest wall pain under her left breast. Allergies  Allergen Reactions  . Contrast Media [Iodinated Diagnostic Agents] Anaphylaxis and Shortness Of Breath    Needed to be defibrillated   . Augmentin [Amoxicillin-Pot Clavulanate] Other (See Comments)    "stomach hurt"  . Codeine Nausea And Vomiting    jittery  . Iohexol      Code: RASH, Desc: PT STATES SHE IS ALLERGIC TO IV CONTRAST 05/28/06/RM, Onset Date: [id number]   . Meperidine Hcl Nausea And Vomiting  . Morphine And Related Nausea And Vomiting     Current Outpatient Prescriptions  Medication Sig Dispense Refill  . atenolol (TENORMIN) 50 MG tablet Take 1 tablet (50 mg total) by mouth 2 (two) times daily. 90 tablet 3  . beclomethasone (QVAR) 80 MCG/ACT inhaler Inhale 2 puffs into the lungs 2 (two) times daily. 1 Inhaler 5  . betamethasone dipropionate (DIPROLENE) 0.05 % cream Apply topically 2 (two) times daily. 30 g 1  . cetirizine (ZYRTEC) 10 MG tablet Take 10 mg by mouth daily as needed for allergies.     . clonazePAM (KLONOPIN) 0.5 MG tablet Take 0.5 mg by mouth daily.  0  . cyclobenzaprine (FLEXERIL) 10 MG tablet Take 10 mg by  mouth 3 (three) times daily as needed for muscle spasms.     . cycloSPORINE (RESTASIS) 0.05 % ophthalmic emulsion Place 1 drop into both eyes 2 (two) times daily.     . diclofenac sodium (VOLTAREN) 1 % GEL Apply 2 g topically 2 (two) times daily as needed (pain).     . fluticasone (FLONASE) 50 MCG/ACT nasal spray Place 2 sprays into both nostrils daily.    Marland Kitchen gabapentin (NEURONTIN) 100 MG capsule Take 1 capsule (100 mg total) by mouth 3 (three) times daily. 90 capsule 2  . glimepiride (AMARYL) 2 MG tablet Take 2 mg by mouth 2 (two) times daily at 8 am and 10 pm.     . HYDROcodone-homatropine (HYCODAN) 5-1.5 MG/5ML syrup Take 5 mLs by mouth every 6 (six) hours as needed for cough. 100 mL 0  . insulin aspart (NOVOLOG) 100 UNIT/ML injection Inject 4-8 Units into the skin 3 (three) times daily before meals. Sliding scale    . insulin glargine (LANTUS) 100 UNIT/ML injection Inject 35 Units into the skin at bedtime.     Marland Kitchen lubiprostone (AMITIZA) 8 MCG capsule Take 3 capsules (24 mcg total) by mouth 2 (two) times daily with a meal. 30 capsule 2  . Olopatadine HCl (PATADAY) 0.2 % SOLN Place 1 drop into both eyes 2 (two) times daily.     . ondansetron (ZOFRAN) 4 MG tablet Take 1 tablet (4 mg total) by mouth every 8 (  eight) hours as needed for nausea or vomiting. 30 tablet 1   No current facility-administered medications for this visit.     Past Medical History  Diagnosis Date  . Long Q-T syndrome   . Palpitation   . Seizure disorder     none recently  . Abdominal pain, periumbilic   . Esophageal stricture   . Sarcoidosis   . Allergic rhinitis   . Bronchitis   . Sarcoidosis   . GERD (gastroesophageal reflux disease)   . Asthma   . Fibromyalgia   . Stroke     QUESTIONABLE TIA   . Neuropathy in diabetes   . Seizures     HISTORY OF TAKING KLONIPIN NOT RX AT THE MOMENT  . Cancer     Nodules in lungs pt believes were cancerous pt unsure  . Arthritis   . Internal hemorrhoids   . Eye hemorrhage      bilateral  . Fibroid, uterine   . Automatic implantable cardioverter-defibrillator in situ 2006, replaced 2008    [employer]  . DM (diabetes mellitus)     type 2  . History of blood transfusion years ago    ROS:   All systems reviewed and negative except as noted in the HPI.   Past Surgical History  Procedure Laterality Date  . Tubal ligation    . Hand surgery    . Svt ablation    . Automatic implantable cardiac defibrillator situ      ICD-[employer]   Remote - Yes   . Hemorrhoid banding    . Dilitation & currettage/hystroscopy with hydrothermal ablation N/A 06/07/2013    Procedure: DILATATION & CURETTAGE/HYSTEROSCOPY WITH attempted HYDROTHERMAL ABLATION;  Surgeon: Frederico Hamman, MD;  Location: [facility];  Service: Gynecology;  Laterality: N/A;  . Esophagogastroduodenoscopy (egd) with propofol N/A 10/24/2014    Procedure: ESOPHAGOGASTRODUODENOSCOPY (EGD) WITH PROPOFOL;  Surgeon: Inda Castle, MD;  Location: [facility];  Service: Endoscopy;  Laterality: N/A;     Family History  Problem Relation Age of Onset  . Cancer    . Diabetes       History   Social History  . Marital Status: Single    Spouse Name: N/A  . Number of Children: N/A  . Years of Education: N/A   Occupational History  . Not on file.   Social History Main Topics  . Smoking status: Never Smoker   . Smokeless tobacco: Never Used  . Alcohol Use: Yes     Comment: occ.  . Drug Use: No  . Sexual Activity: Not on file   Other Topics Concern  . Not on file   Social History Narrative     There were no vitals taken for this visit.  Physical Exam:  Well appearing 44 year old woman, NAD HEENT: Unremarkable Neck:  7 cm JVD, no thyromegally Back:  No CVA tenderness Lungs:  Clear with no wheezes, rales, or rhonchi. HEART:  Regular rate rhythm, no murmurs, no rubs, no clicks Abd:  soft, positive bowel sounds, no organomegally, no rebound, no guarding Ext:  2 plus pulses, no edema, no  cyanosis, no clubbing Skin:  No rashes no nodules Neuro:  CN II through XII intact, motor grossly intact  DEVICE  Normal device function.  See PaceArt for details.   Assess/Plan:

## 2014-11-22 NOTE — ED Notes (Signed)
[employer] called with report--- pt had 3 episodes of vfib with shocks each time

## 2014-11-22 NOTE — ED Notes (Signed)
Pt here from MD office where she had a syncopal episode hitting the front of her head pt has a hematoma to the front of head  , pt had a run of Vtach which was shocked by her internal device per EMS

## 2014-11-22 NOTE — ED Provider Notes (Signed)
CSN: [id number]     Arrival date & time 11/22/14  1010 History   First MD Initiated Contact with Patient 11/22/14 1013     Chief Complaint  Patient presents with  . Loss of Consciousness     (Consider location/radiation/quality/duration/timing/severity/associated sxs/prior Treatment) HPI Patient presents with concern of pain in multiple areas following a fall, syncope. Patient was scheduled outpatient cardiology clinic, when after her visit she had an episode of lightheadedness, loss consciousness, hitting her head. Patient is a very poor historian, but seems to identify areas of new pain as being in her head, face, neck. Pain seems to be sore, severe. There is no confusion, disorientation. Patient describes ongoing soreness in her lower abdomen, and she now acknowledges recovering from recent elective hysterectomy. No current nausea, vomiting, incontinence, loss of sensation in any extremity. Patient is a notable history of pacemaker/defibrillator placement, with history of long QT syndrome. Patient had evaluation of her pacemaker today, and it was felt to be normal. No recent syncope events. EMS reports that the patient had a episode of ventricular tachycardia on the monitor, and she received a defibrillator shock with conversion to sinus rhythm   Past Medical History  Diagnosis Date  . Long Q-T syndrome   . Palpitation   . Seizure disorder     none recently  . Abdominal pain, periumbilic   . Esophageal stricture   . Sarcoidosis   . Allergic rhinitis   . Bronchitis   . Sarcoidosis   . GERD (gastroesophageal reflux disease)   . Asthma   . Fibromyalgia   . Stroke     QUESTIONABLE TIA   . Neuropathy in diabetes   . Seizures     HISTORY OF TAKING KLONIPIN NOT RX AT THE MOMENT  . Cancer     Nodules in lungs pt believes were cancerous pt unsure  . Arthritis   . Internal hemorrhoids   . Eye hemorrhage     bilateral  . Fibroid, uterine   . Automatic implantable  cardioverter-defibrillator in situ 2006, replaced 2008    [employer]  . DM (diabetes mellitus)     type 2  . History of blood transfusion years ago   Past Surgical History  Procedure Laterality Date  . Tubal ligation    . Hand surgery    . Svt ablation    . Automatic implantable cardiac defibrillator situ      ICD-[employer]   Remote - Yes   . Hemorrhoid banding    . Dilitation & currettage/hystroscopy with hydrothermal ablation N/A 06/07/2013    Procedure: DILATATION & CURETTAGE/HYSTEROSCOPY WITH attempted HYDROTHERMAL ABLATION;  Surgeon: Frederico Hamman, MD;  Location: [facility];  Service: Gynecology;  Laterality: N/A;  . Esophagogastroduodenoscopy (egd) with propofol N/A 10/24/2014    Procedure: ESOPHAGOGASTRODUODENOSCOPY (EGD) WITH PROPOFOL;  Surgeon: Inda Castle, MD;  Location: [facility];  Service: Endoscopy;  Laterality: N/A;   Family History  Problem Relation Age of Onset  . Cancer    . Diabetes     History  Substance Use Topics  . Smoking status: Never Smoker   . Smokeless tobacco: Never Used  . Alcohol Use: Yes     Comment: occ.   OB History    No data available     Review of Systems  Constitutional:       Per HPI, otherwise negative  HENT:       Per HPI, otherwise negative  Respiratory:       Per HPI, otherwise negative  Cardiovascular:       Per HPI, otherwise negative  Gastrointestinal: Negative for vomiting.  Endocrine:       Negative aside from HPI  Genitourinary:       Neg aside from HPI   Musculoskeletal:       Per HPI, otherwise negative  Skin: Negative.   Neurological: Positive for syncope, weakness, light-headedness and headaches.      Allergies  Contrast media; Augmentin; Codeine; Iohexol; Meperidine hcl; and Morphine and related  Home Medications   Prior to Admission medications   Medication Sig Start Date End Date Taking? Authorizing Provider  atenolol (TENORMIN) 50 MG tablet Take 1 tablet (50 mg total) by mouth 2 (two) times  daily. 02/19/14   Evans Lance, MD  beclomethasone (QVAR) 80 MCG/ACT inhaler Inhale 2 puffs into the lungs 2 (two) times daily. 09/05/14   Elsie Stain, MD  betamethasone dipropionate (DIPROLENE) 0.05 % cream Apply topically 2 (two) times daily. 10/24/13   Evans Lance, MD  cetirizine (ZYRTEC) 10 MG tablet Take 10 mg by mouth daily as needed for allergies.     Historical Provider, MD  clonazePAM (KLONOPIN) 0.5 MG tablet Take 0.5 mg by mouth daily. 07/13/14   Historical Provider, MD  cyclobenzaprine (FLEXERIL) 10 MG tablet Take 10 mg by mouth 3 (three) times daily as needed for muscle spasms.     Historical Provider, MD  cycloSPORINE (RESTASIS) 0.05 % ophthalmic emulsion Place 1 drop into both eyes 2 (two) times daily.     Historical Provider, MD  diclofenac sodium (VOLTAREN) 1 % GEL Apply 2 g topically 2 (two) times daily as needed (pain).     Historical Provider, MD  fluticasone (FLONASE) 50 MCG/ACT nasal spray Place 2 sprays into both nostrils daily.    Historical Provider, MD  gabapentin (NEURONTIN) 100 MG capsule Take 1 capsule (100 mg total) by mouth 3 (three) times daily. 06/29/14   Tanda Rockers, MD  glimepiride (AMARYL) 2 MG tablet Take 2 mg by mouth 2 (two) times daily at 8 am and 10 pm.     Historical Provider, MD  HYDROcodone-homatropine (HYCODAN) 5-1.5 MG/5ML syrup Take 5 mLs by mouth every 6 (six) hours as needed for cough. 11/05/14   Elsie Stain, MD  insulin aspart (NOVOLOG) 100 UNIT/ML injection Inject 4-8 Units into the skin 3 (three) times daily before meals. Sliding scale    Historical Provider, MD  insulin glargine (LANTUS) 100 UNIT/ML injection Inject 35 Units into the skin at bedtime.     Historical Provider, MD  lubiprostone (AMITIZA) 8 MCG capsule Take 3 capsules (24 mcg total) by mouth 2 (two) times daily with a meal. 08/15/14   Inda Castle, MD  Olopatadine HCl (PATADAY) 0.2 % SOLN Place 1 drop into both eyes 2 (two) times daily.     Historical Provider, MD    ondansetron (ZOFRAN) 4 MG tablet Take 1 tablet (4 mg total) by mouth every 8 (eight) hours as needed for nausea or vomiting. 08/15/14   Inda Castle, MD   BP 167/93 mmHg  Pulse 102  Temp(Src) 99.3 F (37.4 C) (Oral)  Resp 20  SpO2 95% Physical Exam  Constitutional: She is oriented to person, place, and time. She appears ill.  Ill-appearing female awake, alert, answering questions appropriately  HENT:  Head: Normocephalic and atraumatic.  Eyes: Conjunctivae and EOM are normal.  Cardiovascular: Regular rhythm.  Tachycardia present.   Pulmonary/Chest: Effort normal and breath sounds normal. No stridor. No  respiratory distress.    Abdominal: She exhibits no distension.    Musculoskeletal: She exhibits no edema.  Neurological: She is alert and oriented to person, place, and time. No cranial nerve deficit.  Skin: Skin is warm and dry.  Psychiatric: She has a normal mood and affect.  Nursing note and vitals reviewed.   ED Course  Procedures (including critical care time) Labs Review Labs Reviewed  COMPREHENSIVE METABOLIC PANEL - Abnormal; Notable for the following:    Sodium 131 (*)    Potassium 2.8 (*)    Chloride 94 (*)    BUN <5 (*)    Calcium 8.2 (*)    Total Protein 8.2 (*)    Albumin 2.1 (*)    ALT 13 (*)    Alkaline Phosphatase 570 (*)    Total Bilirubin 1.5 (*)    All other components within normal limits  CBC WITH DIFFERENTIAL/PLATELET - Abnormal; Notable for the following:    WBC 12.2 (*)    Hemoglobin 11.4 (*)    HCT 34.7 (*)    RDW 16.2 (*)    Neutrophils Relative % 78 (*)    Neutro Abs 9.4 (*)    Monocytes Absolute 1.2 (*)    All other components within normal limits  I-STAT CG4 LACTIC ACID, ED - Abnormal; Notable for the following:    Lactic Acid, Venous 2.09 (*)    All other components within normal limits  URINALYSIS, ROUTINE W REFLEX MICROSCOPIC (NOT AT [facility])  MAGNESIUM  ALDOSTERONE + RENIN ACTIVITY W/ RATIO    Imaging Review No results  found.  Monitor 101 sinus tach abnormal Pulse ox 99% room air normal EMS rhythm strips showed sinus rhythm, no visualization of episode of ventricular tachycardia.    EKG Interpretation  Date/Time:  Thursday November 22 2014 10:11:28 EDT Ventricular Rate:  102 PR Interval:  141 QRS Duration: 79 QT Interval:  354 QTC Calculation: 461 R Axis:   -74 Text Interpretation:  Sinus tachycardia Left anterior fascicular block Abnormal R-wave progression, late transition Borderline T wave abnormalities Sinus tachycardia Left anterior fasicular block T wave abnormality Abnormal ekg Confirmed by Carmin Muskrat  MD [phone]) on 11/22/2014 10:22:25 AM       Chart review demonstrates the patient has multiple visits with cardiology, pulmonology given her history of chronic illness. Patient had pacemaker interrogation earlier today, unremarkable.  Update: Patient remains in similar condition, uncomfortable appearing  10:38 AM Vital signs remain similar. Interrogation of the defibrillator demonstrates 3 episodes of ventricular fibrillation, with shock each time. No detection of ventricular tachycardia greater than 150 bpm.  2:06 PM I discussed the patient's case with our cardiology team. On repeat exam the patient has a diminished heart rate, has been receiving IV potassium repletion, without complication.   MDM  Patient presents after an episode of syncope. Notably, the patient has multiple medical issues, including QT prolongation, requiring pacemaker/fibrillator placement. Patient's evaluation today is notable for lactic acidosis, hypokalemia, hypomagnesemia, leukocytosis. She has pain in multiple areas. Patient received fluid resuscitation on arrival, and we had the pacemaker/fibrillator interrogated, soon thereafter. Patient's case was discussed at length with our cardiology colleagues per Patient required IV potassium repletion for several hours, which was well tolerated. Given the patient's  notable electrolyte abnormalities, substantial comorbidity, patient required admission for further evaluation, management, monitoring.   CRITICAL CARE Performed by: Carmin Muskrat Total critical care time: 35 Critical care time was exclusive of separately billable procedures and treating other patients. Critical care was necessary to  treat or prevent imminent or life-threatening deterioration. Critical care was time spent personally by me on the following activities: development of treatment plan with patient and/or surrogate as well as nursing, discussions with consultants, evaluation of patient's response to treatment, examination of patient, obtaining history from patient or surrogate, ordering and performing treatments and interventions, ordering and review of laboratory studies, ordering and review of radiographic studies, pulse oximetry and re-evaluation of patient's condition.   Carmin Muskrat, MD 11/22/14 1409

## 2014-11-22 NOTE — Consult Note (Signed)
[facility] Consult: 4:54 PM 11/22/2014  LOS: 0 days    Referring Provider: Dr Lovena Le  Primary Care Physician:  Frederico Hamman, MD Primary Gastroenterologist:  Dr. Deatra Ina     Reason for Consultation:   Nausea and vomiting.   HPI: [name] is a 44 y.o. female.  History type 1 diabetes for about 18 years (initially gestational evolved to insulin requiring, never used oral meds. Status post ICD implantation for long QT syndrome and strong fm hx of sudden cardiac death. Status post catheter ablation of SVT. History sarcoidosis.  History of bilateral pulmonary nodules, latest chest CT of 05/2006 shows no change compared with 2003 so nodules considered benign. Hx IBS with constipation rx with Amitiza, still only moves her bowels a couple of times per week.  Peripheral neuropathy treated with gabapentin, occasionally uses Flexeril for fibromyalgia pain.  Since the early part of 2016, in January/February the patient has had issues with nausea, vomiting and diffuse upper abdominal pain. She's lost at least 40 pounds.  Labs show a steady increase of alkaline phosphatase.  It was 417 on 10/03/2014, 508 on 10/22/2014 and is 570 today.   The previous time she had an alkaline phosphatase obtained was in 2010 when it was normal.  Transaminases and total bilirubin has been normal, however today the T bili is up to 1.5. Her albumen is low at 2.1. 10/08/2014 abdominal ultrasound showed no gallbladder or hepatobiliary abnormality. Increased renal echo texture possibly reflecting medical renal disease. 10/24/14 Upper endoscopy: Normal 10/25/14 oral contrast only CT abdomen pelvis:  Probable mild hepatosplenomegaly. Enlarged uterus containing large left leiomyoma.  11/06/14 TAH/BSO, for dysfunctional uterine bleeding and enlarging  fibroid, at [facility]. Taken back to surgery within a few hours due to hypotension and acute drop Hgb to 3 and underwent ex lap and evacuation hematoma.  Transfuse "7" units blood and 2 FFP according to discharge summary.   Follow-up office visit on 11/21/14 with removal of staples. No complications noted by surgeon. Dr. Deatra Ina had suggested gastric emptying study if the end and CT were negative.  Patient continues to have nausea and vomiting. There are days when she can tolerate some solids and some liquids other days when she vomits everything up. She says that taking Nexium definitely helps, however she is unable to afford this medication on a regular basis.  Patient was using some limited hydrocodone following her hysterectomy but since the abdominal pain improved after surgery, she hasn't used this for at least a couple of weeks. Doppler studies yesterday, for c/o swelling and pain in left foot, study was negative.   Patient had a spell with dizziness and possibly with syncope yesterday. She fell and sustained some trauma to her lower abdomen but this did not cause her incision to open.  Patient went to cardiology today for evaluation. She had syncopal spell in the lobby and hit her head as she fell to the floor. She was sent over to the hospital and in the parking lot, her defibrillator fired  CT of her head shows  right frontal hematoma and no fractures or intracranial trauma. Potassium level low at 2.8. BUN and creatinine are okay.  Defibrillator interrogation shows that she was in torsades which triggered the firing of the defibrillator.   Patient does not use illicit drugs and rarely drinks alcohol, she can't recall the last time she had a glass of wine or at the year.   She has 3 sons aged 56, 73 and 49. She doesn't smoke cigarettes. She is on disability and contains her healthcare and medications through Medicaid.    Past Medical History  Diagnosis Date  . Long Q-T syndrome     . Palpitation   . Seizure disorder     none recently  . Abdominal pain, periumbilic   . Esophageal stricture 2005/2009    esophageal strictures dilated 2005, 2009  . Sarcoidosis   . Allergic rhinitis   . Bronchitis   . GERD (gastroesophageal reflux disease) 2005  . Asthma   . Fibromyalgia   . Stroke     QUESTIONABLE TIA   . Neuropathy in diabetes   . Seizures     HISTORY OF TAKING KLONIPIN NOT RX AT THE MOMENT  . Cancer     Nodules in lungs pt believes were cancerous pt unsure  . Arthritis   . Internal hemorrhoids 2010    2011 band ligation of int rrhoids.  . Eye hemorrhage     bilateral  . Fibroid, uterine   . Automatic implantable cardioverter-defibrillator in situ 2006, replaced 2008    MEDTRONIC  . DM (diabetes mellitus) 1997/1998    type 1, initially gestational but soon after child birth developed IDDM  . History of blood transfusion years ago    Past Surgical History  Procedure Laterality Date  . Tubal ligation    . Hand surgery    . Svt ablation    . Automatic implantable cardiac defibrillator situ  2006,     ICD-Medtronic   Remote - Yes   . Hemorrhoid banding  03/2010  . Dilitation & currettage/hystroscopy with hydrothermal ablation N/A 06/07/2013    Procedure: DILATATION & CURETTAGE/HYSTEROSCOPY WITH attempted HYDROTHERMAL ABLATION;  Surgeon: Frederico Hamman, MD;  Location: [facility];  Service: Gynecology;  Laterality: N/A;  . Esophagogastroduodenoscopy (egd) with propofol N/A 10/24/2014    Procedure: ESOPHAGOGASTRODUODENOSCOPY (EGD) WITH PROPOFOL;  Surgeon: Inda Castle, MD;  Location: [facility];  Service: Endoscopy;  Laterality: N/A;  . Total abdominal hysterectomy w/ bilateral salpingoophorectomy  11/06/14    at [facility]. for menorrhagia, pelvic pain and enlarging uterine fibroids  . Exploratory laparotomy  11/06/14    evacuation of post TAH/BSO hematoma    Prior to Admission medications   Medication Sig Start Date End Date Taking? Authorizing Provider   insulin aspart (NOVOLOG) 100 UNIT/ML injection Inject 4-8 Units into the skin 3 (three) times daily before meals. Sliding scale   Yes Historical Provider, MD  insulin glargine (LANTUS) 100 UNIT/ML injection Inject 35 Units into the skin at bedtime.    Yes Historical Provider, MD  atenolol (TENORMIN) 50 MG tablet Take 1 tablet (50 mg total) by mouth 2 (two) times daily. 02/19/14   Evans Lance, MD  beclomethasone (QVAR) 80 MCG/ACT inhaler Inhale 2 puffs into the lungs 2 (two) times daily. 09/05/14   Elsie Stain, MD  betamethasone dipropionate (DIPROLENE) 0.05 % cream Apply topically 2 (two) times daily. 10/24/13   Evans Lance, MD  cetirizine (ZYRTEC) 10 MG tablet Take 10 mg by mouth daily as needed  for allergies.     Historical Provider, MD  clonazePAM (KLONOPIN) 0.5 MG tablet Take 0.5 mg by mouth daily. 07/13/14   Historical Provider, MD  cyclobenzaprine (FLEXERIL) 10 MG tablet Take 10 mg by mouth 3 (three) times daily as needed for muscle spasms.     Historical Provider, MD  cycloSPORINE (RESTASIS) 0.05 % ophthalmic emulsion Place 1 drop into both eyes 2 (two) times daily.     Historical Provider, MD  diclofenac sodium (VOLTAREN) 1 % GEL Apply 2 g topically 2 (two) times daily as needed (pain).     Historical Provider, MD  fluticasone (FLONASE) 50 MCG/ACT nasal spray Place 2 sprays into both nostrils daily.    Historical Provider, MD  gabapentin (NEURONTIN) 100 MG capsule Take 1 capsule (100 mg total) by mouth 3 (three) times daily. 06/29/14   Tanda Rockers, MD  glimepiride (AMARYL) 2 MG tablet Take 2 mg by mouth 2 (two) times daily at 8 am and 10 pm.     Historical Provider, MD  HYDROcodone-homatropine (HYCODAN) 5-1.5 MG/5ML syrup Take 5 mLs by mouth every 6 (six) hours as needed for cough. 11/05/14   Elsie Stain, MD  lubiprostone (AMITIZA) 8 MCG capsule Take 3 capsules (24 mcg total) by mouth 2 (two) times daily with a meal. 08/15/14   Inda Castle, MD  Olopatadine HCl (PATADAY) 0.2 %  SOLN Place 1 drop into both eyes 2 (two) times daily.     Historical Provider, MD  ondansetron (ZOFRAN) 4 MG tablet Take 1 tablet (4 mg total) by mouth every 8 (eight) hours as needed for nausea or vomiting. 08/15/14   Inda Castle, MD    Scheduled Meds: . [START ON 11/23/2014] pantoprazole  40 mg Oral Q0600   Infusions: . potassium chloride 10 mEq (11/22/14 1522)   PRN Meds:    Allergies as of 11/22/2014 - Review Complete 11/22/2014  Allergen Reaction Noted  . Contrast media [iodinated diagnostic agents] Anaphylaxis and Shortness Of Breath 05/30/2013  . Augmentin [amoxicillin-pot clavulanate] Other (See Comments) 10/23/2014  . Codeine Nausea And Vomiting   . Iohexol  05/28/2006  . Meperidine hcl Nausea And Vomiting   . Morphine and related Nausea And Vomiting 05/30/2013    Family History  Problem Relation Age of Onset  . Cancer    . Diabetes      History   Social History  . Marital Status: Single    Spouse Name: N/A  . Number of Children: N/A  . Years of Education: N/A   Occupational History  . Not on file.   Social History Main Topics  . Smoking status: Never Smoker   . Smokeless tobacco: Never Used  . Alcohol Use: Yes     Comment: occ.  . Drug Use: No  . Sexual Activity: Not on file   Other Topics Concern  . Not on file   Social History Narrative    REVIEW OF SYSTEMS: Constitutional:   Per history of present illness.   Generally feels very weak. ENT:  No nose bleeds Pulm:   Some shortness of breath this morning before going into the cardiologist's office. No productive cough. CV:  No palpitations, no LE edema.  GU:  No hematuria, no frequency GI:  Per hpi  Heme:   No issues with unusual bleeding or unexplained bruising.   Transfusions:   Per HPI Neuro:  No headaches, no peripheral tingling or numbness MS:   Has been having swelling and pain in her left  foot /ankle. Derm:  No itching, no rash or sores.  Endocrine:  No sweats or chills.  No polyuria  or dysuria Immunization:   Did not inquire. Travel:  None beyond local counties in last few months.    PHYSICAL EXAM: Vital signs in last 24 hours: Filed Vitals:   11/22/14 1400  BP: 153/89  Pulse: 99  Temp:  99.3  Resp: 21   Wt Readings from Last 3 Encounters:  11/05/14 145 lb (65.772 kg)  10/22/14 148 lb 12.8 oz (67.495 kg)  10/03/14 155 lb (70.308 kg)    General:  Pleasant, frail and chronically ill-appearing AAF. Head:   Hematoma noted on the right.  Eyes:   No scleral icterus or conjunctival pallor. Ears:   Not hard of hearing.  Nose:   No discharge or congestion. Mouth:   Clear and moist mm Neck:   No JVD, no TMG, no masses. Lungs:   CTA bilaterally. No labored breathing or cough. Heart:  RRR. No MRG. S1/S2 audible.   Defibrillator visible beneath skin on upper left chest Abdomen:   Soft, ND. Slight tenderness in the lower abdomen , no guarding or rebound. PAL sounds active. No tympanitic or tinkling bowel sounds.   Low transverse scar consistent with recent hysterectomy is intact , clean and dry,  Interval strips of surgical tape cover incision.   Rectal:  Deferred.   Musc/Skeltl:  No joint swelling, redness or deformity. Extremities:   Non-pitting, mild swelling and tenderness in the right foot dorsum and ankle. The right calf is not tender.  Neurologic:   Oriented 3. No tremors. Moves all 4 limbs, strength not tested. Entirely appropriate and able to provide a good history. Skin:   No telangiectasia, rash or sores. Tattoos:   None seen Nodes:   No cervical or inguinal adenopathy.   Psych:   Although somewhat depressed, she is pleasant, relaxed and engaged.  Intake/Output from previous day:   Intake/Output this shift: Total I/O In: -  Out: 400 [Urine:400]  LAB RESULTS:  Recent Labs  11/22/14 1021  WBC 12.2*  HGB 11.4*  HCT 34.7*  PLT 154   BMET Lab Results  Component Value Date   NA 131* 11/22/2014   NA 127* 10/22/2014   NA 137 07/27/2013   K  2.8* 11/22/2014   K 3.9 10/22/2014   K 4.1 07/27/2013   CL 94* 11/22/2014   CL 90* 10/22/2014   CL 98 07/27/2013   CO2 27 11/22/2014   CO2 31 10/22/2014   CO2 26 07/27/2013   GLUCOSE 81 11/22/2014   GLUCOSE 165* 10/22/2014   GLUCOSE 352* 07/27/2013   BUN <5* 11/22/2014   BUN 4* 10/22/2014   BUN 5* 07/27/2013   CREATININE 0.47 11/22/2014   CREATININE 0.49 10/22/2014   CREATININE 0.61 07/27/2013   CALCIUM 8.2* 11/22/2014   CALCIUM 9.5 10/22/2014   CALCIUM 9.8 07/27/2013   LFT  Recent Labs  11/22/14 1021  PROT 8.2*  ALBUMIN 2.1*  AST 36  ALT 13*  ALKPHOS 570*  BILITOT 1.5*   PT/INR Lab Results  Component Value Date   INR 1.0 ratio 01/06/2010   INR 1.04 03/01/2009   INR 1.0 09/17/2008   Hepatitis Panel No results for input(s): HEPBSAG, HCVAB, HEPAIGM, HEPBIGM in the last 72 hours. C-Diff No components found for: CDIFF Lipase     Component Value Date/Time   LIPASE 38.0 10/22/2014 1531      RADIOLOGY STUDIES: Ct Head Wo Contrast  11/22/2014  CLINICAL DATA:  Fall, syncope and, frontal hematoma  EXAM: CT HEAD WITHOUT CONTRAST  CT MAXILLOFACIAL WITHOUT CONTRAST  CT CERVICAL SPINE WITHOUT CONTRAST  TECHNIQUE: Multidetector CT imaging of the head, cervical spine, and maxillofacial structures were performed using the standard protocol without intravenous contrast. Multiplanar CT image reconstructions of the cervical spine and maxillofacial structures were also generated.  COMPARISON:  09/17/2008  FINDINGS: CT HEAD FINDINGS  No skull fracture is noted. There is scalp swelling in right frontal region. Small subcutaneous hematoma right frontal scalp measures 2 cm length by 4 mm thickness.  No intracranial hemorrhage, mass effect or midline shift. No acute cortical infarction. No mass lesion is noted on this unenhanced scan. No intraventricular hemorrhage.  CT MAXILLOFACIAL FINDINGS  There is mucosal thickening with partial opacification bilateral ethmoid air cells. Mild  mucosal thickening bilateral maxillary sinus and bilateral frontal sinus. There is bubbly opacification of the left maxillary sinus. Sinusitis cannot be excluded.  The mastoid air cells are unremarkable.  There is no nasal bone fracture. No intraorbital hematoma. No zygomatic fracture. No facial fluid collection. Bilateral eye globes are symmetrical in appearance. Coronal images shows no orbital floor or orbital rim fracture. There is mild left deviation of nasal bony septum. The nasal turbinates are unremarkable.  No mandibular fracture is noted.  There is no TMJ dislocation.  Sagittal images shows no maxillary spine fracture. The nasopharyngeal and oropharyngeal airway is patent.  CT CERVICAL SPINE FINDINGS  Axial images of the cervical spine shows no acute fracture or subluxation. Computer processed images shows no acute fracture or subluxation. Mild degenerative changes are noted C1-C2 articulation. Alignment, and vertebral body heights are preserved. No prevertebral soft tissue swelling. Cervical airway is patent.  IMPRESSION: 1. There is no acute intracranial abnormality. 2. Paranasal sinuses disease as described above. 3. There is scalp swelling and small scalp hematoma in right frontal region. 4. No orbital rim or orbital floor fracture. No facial fluid collection. Patent nasopharyngeal and oropharyngeal airway. 5. No mandibular fracture. No zygomatic fracture. No intraorbital hematoma. No TMJ dislocation. 6. No cervical spine acute fracture or subluxation. Mild degenerative changes C1-C2 articulation.   Electronically Signed   By: Lahoma Crocker M.D.   On: 11/22/2014 11:35   Ct Cervical Spine Wo Contrast  11/22/2014   CLINICAL DATA:  Fall, syncope and, frontal hematoma  EXAM: CT HEAD WITHOUT CONTRAST  CT MAXILLOFACIAL WITHOUT CONTRAST  CT CERVICAL SPINE WITHOUT CONTRAST  TECHNIQUE: Multidetector CT imaging of the head, cervical spine, and maxillofacial structures were performed using the standard protocol  without intravenous contrast. Multiplanar CT image reconstructions of the cervical spine and maxillofacial structures were also generated.  COMPARISON:  09/17/2008  FINDINGS: CT HEAD FINDINGS  No skull fracture is noted. There is scalp swelling in right frontal region. Small subcutaneous hematoma right frontal scalp measures 2 cm length by 4 mm thickness.  No intracranial hemorrhage, mass effect or midline shift. No acute cortical infarction. No mass lesion is noted on this unenhanced scan. No intraventricular hemorrhage.  CT MAXILLOFACIAL FINDINGS  There is mucosal thickening with partial opacification bilateral ethmoid air cells. Mild mucosal thickening bilateral maxillary sinus and bilateral frontal sinus. There is bubbly opacification of the left maxillary sinus. Sinusitis cannot be excluded.  The mastoid air cells are unremarkable.  There is no nasal bone fracture. No intraorbital hematoma. No zygomatic fracture. No facial fluid collection. Bilateral eye globes are symmetrical in appearance. Coronal images shows no orbital floor or orbital rim fracture. There is  mild left deviation of nasal bony septum. The nasal turbinates are unremarkable.  No mandibular fracture is noted.  There is no TMJ dislocation.  Sagittal images shows no maxillary spine fracture. The nasopharyngeal and oropharyngeal airway is patent.  CT CERVICAL SPINE FINDINGS  Axial images of the cervical spine shows no acute fracture or subluxation. Computer processed images shows no acute fracture or subluxation. Mild degenerative changes are noted C1-C2 articulation. Alignment, and vertebral body heights are preserved. No prevertebral soft tissue swelling. Cervical airway is patent.  IMPRESSION: 1. There is no acute intracranial abnormality. 2. Paranasal sinuses disease as described above. 3. There is scalp swelling and small scalp hematoma in right frontal region. 4. No orbital rim or orbital floor fracture. No facial fluid collection. Patent  nasopharyngeal and oropharyngeal airway. 5. No mandibular fracture. No zygomatic fracture. No intraorbital hematoma. No TMJ dislocation. 6. No cervical spine acute fracture or subluxation. Mild degenerative changes C1-C2 articulation.   Electronically Signed   By: Lahoma Crocker M.D.   On: 11/22/2014 11:35   Ct Maxillofacial Wo Cm  11/22/2014   CLINICAL DATA:  Fall, syncope and, frontal hematoma  EXAM: CT HEAD WITHOUT CONTRAST  CT MAXILLOFACIAL WITHOUT CONTRAST  CT CERVICAL SPINE WITHOUT CONTRAST  TECHNIQUE: Multidetector CT imaging of the head, cervical spine, and maxillofacial structures were performed using the standard protocol without intravenous contrast. Multiplanar CT image reconstructions of the cervical spine and maxillofacial structures were also generated.  COMPARISON:  09/17/2008  FINDINGS: CT HEAD FINDINGS  No skull fracture is noted. There is scalp swelling in right frontal region. Small subcutaneous hematoma right frontal scalp measures 2 cm length by 4 mm thickness.  No intracranial hemorrhage, mass effect or midline shift. No acute cortical infarction. No mass lesion is noted on this unenhanced scan. No intraventricular hemorrhage.  CT MAXILLOFACIAL FINDINGS  There is mucosal thickening with partial opacification bilateral ethmoid air cells. Mild mucosal thickening bilateral maxillary sinus and bilateral frontal sinus. There is bubbly opacification of the left maxillary sinus. Sinusitis cannot be excluded.  The mastoid air cells are unremarkable.  There is no nasal bone fracture. No intraorbital hematoma. No zygomatic fracture. No facial fluid collection. Bilateral eye globes are symmetrical in appearance. Coronal images shows no orbital floor or orbital rim fracture. There is mild left deviation of nasal bony septum. The nasal turbinates are unremarkable.  No mandibular fracture is noted.  There is no TMJ dislocation.  Sagittal images shows no maxillary spine fracture. The nasopharyngeal and  oropharyngeal airway is patent.  CT CERVICAL SPINE FINDINGS  Axial images of the cervical spine shows no acute fracture or subluxation. Computer processed images shows no acute fracture or subluxation. Mild degenerative changes are noted C1-C2 articulation. Alignment, and vertebral body heights are preserved. No prevertebral soft tissue swelling. Cervical airway is patent.  IMPRESSION: 1. There is no acute intracranial abnormality. 2. Paranasal sinuses disease as described above. 3. There is scalp swelling and small scalp hematoma in right frontal region. 4. No orbital rim or orbital floor fracture. No facial fluid collection. Patent nasopharyngeal and oropharyngeal airway. 5. No mandibular fracture. No zygomatic fracture. No intraorbital hematoma. No TMJ dislocation. 6. No cervical spine acute fracture or subluxation. Mild degenerative changes C1-C2 articulation.   Electronically Signed   By: Lahoma Crocker M.D.   On: 11/22/2014 11:35    ENDOSCOPIC STUDIES: 10/24/14 EGD. Normal study.  03/2010 flexible sigmoidoscopy with band ligation of internal hemorrhoids.  04/2009 flexible sigmoidoscopy for rectal bleeding. Study revealed internal  hemorrhoids.  01/2008 EGD or dysphagia and reflux symptoms. Distal esophageal stricture was dilated, Bravo pH probe placed.  11/2004 colonoscopy for history constipation. Normal study to cecum..  01/2004 EGD. For reflux symptoms.  Noted distal esophagitis and distal esophageal stricture which was dilated.    IMPRESSION:   *   Prolonged, chronic  Nausea and vomiting. Recent EGDs and CT scan as well as ultrasound all unrevealing as to source.   The CT scan does show borderline hepatosplenomegaly. Labs reveal climbing alkaline phosphatase level , today her T bili is slightly elevated. Previously this as well as transaminases all normal.  *   Status post TAH/BSO 6/21 for dysfunctional uterine bleeding and enlarged fibroids. Second surgery same day, ex lap with evacuation of  hematoma.  Doing well at time of follow-up with surgeon 7/ 6/16.   Abdominal pain improved since surgery.  *   IBS-C.   On Amitiza for several years. Has bowel movements about 2 times per week.  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 Labs reveal hypokalemia, hyponatremia.   Magnesium level normal.  *   Long-standing history of type I diabetes mellitus.   Do not see a hemoglobin A1c and affect.  *   History sarcoidosis.   Latest chest CT was 05/2006 showing stable, benign bilateral pulmonary nodules.    PLAN:     *  Obtain gastric emptying study during this hospitalization.  *  Added Protonix once daily.  Prn Zofran in place.    [name]  11/22/2014, 4:54 PM Pager: [phone]  GI ATTENDING  History, labs, x-rays, endoscopy reports reviewed. Case discussed in detail with Advanced Practitioner. Agree with consultation note as outlined above. Impressive and complex overall medical history for this 44 year old. Admitted with malignant arrhythmia and traumatic syncope. Hypokalemic without obvious cause. Recent GI issues being addressed by Dr. Deatra Ina include chronic nausea and vomiting, marked weight loss, and rising alkaline phophatase.  IMPRESSION 1. Nausea and vomiting. There is a component of GERD. Rule out diabetic gastroparesis as a contributor. 2.Weight loss. Multifactorial and related to multiple medical problems with decreased PO intake related to N/V. No obvious underlying malignancy (noncontrast CT w/ large uterine leiomyoma; CXR negative). 3. Elevated Alkaline phosphatase. Mostly liver portion. Could be hepatic granulomatous disease (sarcoid)  RECOMMENDATIONS 1. PPI as this has helped with N/V, somewhat, previously. Needs to be taken regularly as outpatient. 2. Solid phase gastric emptying scan (we have ordered) 3. Antiemetics prn. May give on a running schedule if necessary  4. Resume outpatient evaluation of alkaline phosphatase with Dr. Deatra Ina  Will follow. Thank  you.  Docia Chuck. Geri Seminole., M.D. [facility] Division of Gastroenterology

## 2014-11-23 ENCOUNTER — Inpatient Hospital Stay (HOSPITAL_COMMUNITY): Payer: Medicaid Other

## 2014-11-23 ENCOUNTER — Other Ambulatory Visit: Payer: Self-pay | Admitting: Physician Assistant

## 2014-11-23 DIAGNOSIS — I4581 Long QT syndrome: Secondary | ICD-10-CM

## 2014-11-23 DIAGNOSIS — R748 Abnormal levels of other serum enzymes: Secondary | ICD-10-CM | POA: Diagnosis present

## 2014-11-23 DIAGNOSIS — K5901 Slow transit constipation: Secondary | ICD-10-CM | POA: Diagnosis present

## 2014-11-23 DIAGNOSIS — L8990 Pressure ulcer of unspecified site, unspecified stage: Secondary | ICD-10-CM | POA: Insufficient documentation

## 2014-11-23 DIAGNOSIS — R634 Abnormal weight loss: Secondary | ICD-10-CM | POA: Diagnosis present

## 2014-11-23 DIAGNOSIS — Z95810 Presence of automatic (implantable) cardiac defibrillator: Secondary | ICD-10-CM

## 2014-11-23 DIAGNOSIS — R112 Nausea with vomiting, unspecified: Secondary | ICD-10-CM | POA: Diagnosis present

## 2014-11-23 LAB — CBC
HCT: 33.7 % — ABNORMAL LOW (ref 36.0–46.0)
HEMOGLOBIN: 10.9 g/dL — AB (ref 12.0–15.0)
MCH: 28.2 pg (ref 26.0–34.0)
MCHC: 32.3 g/dL (ref 30.0–36.0)
MCV: 87.1 fL (ref 78.0–100.0)
Platelets: 174 10*3/uL (ref 150–400)
RBC: 3.87 MIL/uL (ref 3.87–5.11)
RDW: 16.7 % — AB (ref 11.5–15.5)
WBC: 10.9 10*3/uL — ABNORMAL HIGH (ref 4.0–10.5)

## 2014-11-23 LAB — COMPREHENSIVE METABOLIC PANEL
ALT: 13 U/L — ABNORMAL LOW (ref 14–54)
AST: 37 U/L (ref 15–41)
Albumin: 2 g/dL — ABNORMAL LOW (ref 3.5–5.0)
Alkaline Phosphatase: 597 U/L — ABNORMAL HIGH (ref 38–126)
Anion gap: 7 (ref 5–15)
BILIRUBIN TOTAL: 1.4 mg/dL — AB (ref 0.3–1.2)
CHLORIDE: 98 mmol/L — AB (ref 101–111)
CO2: 27 mmol/L (ref 22–32)
CREATININE: 0.48 mg/dL (ref 0.44–1.00)
Calcium: 8 mg/dL — ABNORMAL LOW (ref 8.9–10.3)
GFR calc Af Amer: >60 mL/min (ref >60)
GFR calc non Af Amer: >60 mL/min (ref >60)
GLUCOSE: 72 mg/dL (ref 65–99)
Potassium: 4.1 mmol/L (ref 3.5–5.1)
Sodium: 132 mmol/L — ABNORMAL LOW (ref 135–145)
Total Protein: 7.4 g/dL (ref 6.5–8.1)

## 2014-11-23 LAB — GLUCOSE, CAPILLARY
GLUCOSE-CAPILLARY: 107 mg/dL — AB (ref 65–99)
GLUCOSE-CAPILLARY: 72 mg/dL (ref 65–99)
GLUCOSE-CAPILLARY: 93 mg/dL (ref 65–99)
Glucose-Capillary: 60 mg/dL — ABNORMAL LOW (ref 65–99)
Glucose-Capillary: 68 mg/dL (ref 65–99)
Glucose-Capillary: 91 mg/dL (ref 65–99)
Glucose-Capillary: 111 mg/dL — ABNORMAL HIGH (ref 65–99)

## 2014-11-23 LAB — MAGNESIUM: Magnesium: 2.3 mg/dL (ref 1.7–2.4)

## 2014-11-23 MED ORDER — OXYCODONE HCL 5 MG PO TABS
5.0000 mg | ORAL_TABLET | Freq: Four times a day (QID) | ORAL | Status: DC | PRN
  Administered 2014-11-23: 5 mg via ORAL
  Filled 2014-11-23: qty 1

## 2014-11-23 MED ORDER — DEXTROSE 10 % IV SOLN
INTRAVENOUS | Status: DC
  Administered 2014-11-23: 07:00:00 via INTRAVENOUS

## 2014-11-23 MED ORDER — ADULT MULTIVITAMIN W/MINERALS CH
1.0000 | ORAL_TABLET | Freq: Every day | ORAL | Status: DC
  Administered 2014-11-24: 1 via ORAL
  Filled 2014-11-23 (×2): qty 1

## 2014-11-23 MED ORDER — DEXTROSE 50 % IV SOLN
INTRAVENOUS | Status: AC
  Filled 2014-11-23: qty 50

## 2014-11-23 MED ORDER — DEXTROSE 50 % IV SOLN
25.0000 mL | Freq: Once | INTRAVENOUS | Status: AC
  Administered 2014-11-23: 25 mL via INTRAVENOUS

## 2014-11-23 MED ORDER — GLUCERNA SHAKE PO LIQD
237.0000 mL | Freq: Three times a day (TID) | ORAL | Status: DC
  Administered 2014-11-23 – 2014-11-24 (×2): 237 mL via ORAL

## 2014-11-23 MED ORDER — TECHNETIUM TC 99M SULFUR COLLOID
2.0000 | Freq: Once | INTRAVENOUS | Status: AC | PRN
  Administered 2014-11-23: 2 via ORAL

## 2014-11-23 MED ORDER — ACETAMINOPHEN 500 MG PO TABS
500.0000 mg | ORAL_TABLET | Freq: Four times a day (QID) | ORAL | Status: DC | PRN
  Administered 2014-11-24: 500 mg via ORAL
  Filled 2014-11-23: qty 1

## 2014-11-23 NOTE — Care Management Note (Signed)
Case Management Note  Patient Details  Name: TIFFNEY HAUGHTON MRN: [id number] Date of Birth: March 07, 1971  Subjective/Objective:      Received referral for medication assistance.  Talked with pt who states she cannot always get medications because Medicaid will not cover cost.  CM questioned which medications - pt states she could not get Medicaid coverage for atenolol.  CM called her pharmacy - [facility] on [address].  Per pharmacist, Medicaid has been paying for all scripts except Hycodan.  Usual cost of Hycodan is $36.  Provided pt with coupon so that she can purchase Hycodan for $15.94.                Expected Discharge Plan:  Home/Self Care   Discharge planning Services  CM Consult  Status of Service:  Completed, signed off   Girard Cooter, [place] 11/23/2014, 1:56 PM

## 2014-11-23 NOTE — Progress Notes (Signed)
Utilization Review Completed.  

## 2014-11-23 NOTE — Progress Notes (Signed)
Daily Rounding Note  11/23/2014, 2:52 PM  LOS: 1 day   SUBJECTIVE:       Gastric emptying study normal. No nausea so far after solids for lunch. Some lower abdominal pain where incision is and where she fell the other day  OBJECTIVE:         Vital signs in last 24 hours:    Temp:  [99.4 F (37.4 C)-100 F (37.8 C)] 100 F (37.8 C) (07/08 0734) Pulse Rate:  [79-107] 95 (07/08 0824) Resp:  [20-27] 23 (07/08 0824) BP: (122-173)/(73-98) 149/89 mmHg (07/08 0800) SpO2:  [96 %-99 %] 99 % (07/08 0824) Weight:  [138 lb 3.7 oz (62.7 kg)] 138 lb 3.7 oz (62.7 kg) (07/07 1800)   Filed Weights   11/22/14 1800  Weight: 138 lb 3.7 oz (62.7 kg)   General: thin, comfortable   Heart: RRR Chest: clear bil.  No SOB Abdomen: globally tender without guard or rebound.  BS present.    Extremities: no CCE Neuro/Psych:  Pleasant, affect depressed. Fully alert, appropriate and oriented.   Intake/Output from previous day: 07/07 0701 - 07/08 0700 In: 544.8 [P.O.:240; I.V.:4.8; IV Piggyback:300] Out: 1300 [Urine:1300]  Intake/Output this shift: Total I/O In: 40 [I.V.:40] Out: 500 [Urine:500]  Lab Results:  Recent Labs  11/22/14 1021 11/23/14 0333  WBC 12.2* 10.9*  HGB 11.4* 10.9*  HCT 34.7* 33.7*  PLT 154 174   BMET  Recent Labs  11/22/14 1021 11/22/14 1925 11/23/14 0333  NA 131* 134* 132*  K 2.8* 3.5 4.1  CL 94* 97* 98*  CO2 27 29 27   GLUCOSE 81 82 72  BUN <5* <5* <5*  CREATININE 0.47 0.51 0.48  CALCIUM 8.2* 8.1* 8.0*   LFT  Recent Labs  11/22/14 1021 11/23/14 0333  PROT 8.2* 7.4  ALBUMIN 2.1* 2.0*  AST 36 37  ALT 13* 13*  ALKPHOS 570* 597*  BILITOT 1.5* 1.4*   PT/INR No results for input(s): LABPROT, INR in the last 72 hours. Hepatitis Panel No results for input(s): HEPBSAG, HCVAB, HEPAIGM, HEPBIGM in the last 72 hours.  Studies/Results: Ct Head Wo Contrast  11/22/2014   CLINICAL DATA:  Fall,  syncope and, frontal hematoma  EXAM: CT HEAD WITHOUT CONTRAST  CT MAXILLOFACIAL WITHOUT CONTRAST  CT CERVICAL SPINE WITHOUT CONTRAST  TECHNIQUE: Multidetector CT imaging of the head, cervical spine, and maxillofacial structures were performed using the standard protocol without intravenous contrast. Multiplanar CT image reconstructions of the cervical spine and maxillofacial structures were also generated.  COMPARISON:  09/17/2008  FINDINGS: CT HEAD FINDINGS  No skull fracture is noted. There is scalp swelling in right frontal region. Small subcutaneous hematoma right frontal scalp measures 2 cm length by 4 mm thickness.  No intracranial hemorrhage, mass effect or midline shift. No acute cortical infarction. No mass lesion is noted on this unenhanced scan. No intraventricular hemorrhage.  CT MAXILLOFACIAL FINDINGS  There is mucosal thickening with partial opacification bilateral ethmoid air cells. Mild mucosal thickening bilateral maxillary sinus and bilateral frontal sinus. There is bubbly opacification of the left maxillary sinus. Sinusitis cannot be excluded.  The mastoid air cells are unremarkable.  There is no nasal bone fracture. No intraorbital hematoma. No zygomatic fracture. No facial fluid collection. Bilateral eye globes are symmetrical in appearance. Coronal images shows no orbital floor or orbital rim fracture. There is mild left deviation of nasal bony septum. The nasal turbinates are unremarkable.  No mandibular fracture is noted.  There is no TMJ dislocation.  Sagittal images shows no maxillary spine fracture. The nasopharyngeal and oropharyngeal airway is patent.  CT CERVICAL SPINE FINDINGS  Axial images of the cervical spine shows no acute fracture or subluxation. Computer processed images shows no acute fracture or subluxation. Mild degenerative changes are noted C1-C2 articulation. Alignment, and vertebral body heights are preserved. No prevertebral soft tissue swelling. Cervical airway is  patent.  IMPRESSION: 1. There is no acute intracranial abnormality. 2. Paranasal sinuses disease as described above. 3. There is scalp swelling and small scalp hematoma in right frontal region. 4. No orbital rim or orbital floor fracture. No facial fluid collection. Patent nasopharyngeal and oropharyngeal airway. 5. No mandibular fracture. No zygomatic fracture. No intraorbital hematoma. No TMJ dislocation. 6. No cervical spine acute fracture or subluxation. Mild degenerative changes C1-C2 articulation.   Electronically Signed   By: Lahoma Crocker M.D.   On: 11/22/2014 11:35   Ct Cervical Spine Wo Contrast  11/22/2014   CLINICAL DATA:  Fall, syncope and, frontal hematoma  EXAM: CT HEAD WITHOUT CONTRAST  CT MAXILLOFACIAL WITHOUT CONTRAST  CT CERVICAL SPINE WITHOUT CONTRAST  TECHNIQUE: Multidetector CT imaging of the head, cervical spine, and maxillofacial structures were performed using the standard protocol without intravenous contrast. Multiplanar CT image reconstructions of the cervical spine and maxillofacial structures were also generated.  COMPARISON:  09/17/2008  FINDINGS: CT HEAD FINDINGS  No skull fracture is noted. There is scalp swelling in right frontal region. Small subcutaneous hematoma right frontal scalp measures 2 cm length by 4 mm thickness.  No intracranial hemorrhage, mass effect or midline shift. No acute cortical infarction. No mass lesion is noted on this unenhanced scan. No intraventricular hemorrhage.  CT MAXILLOFACIAL FINDINGS  There is mucosal thickening with partial opacification bilateral ethmoid air cells. Mild mucosal thickening bilateral maxillary sinus and bilateral frontal sinus. There is bubbly opacification of the left maxillary sinus. Sinusitis cannot be excluded.  The mastoid air cells are unremarkable.  There is no nasal bone fracture. No intraorbital hematoma. No zygomatic fracture. No facial fluid collection. Bilateral eye globes are symmetrical in appearance. Coronal images  shows no orbital floor or orbital rim fracture. There is mild left deviation of nasal bony septum. The nasal turbinates are unremarkable.  No mandibular fracture is noted.  There is no TMJ dislocation.  Sagittal images shows no maxillary spine fracture. The nasopharyngeal and oropharyngeal airway is patent.  CT CERVICAL SPINE FINDINGS  Axial images of the cervical spine shows no acute fracture or subluxation. Computer processed images shows no acute fracture or subluxation. Mild degenerative changes are noted C1-C2 articulation. Alignment, and vertebral body heights are preserved. No prevertebral soft tissue swelling. Cervical airway is patent.  IMPRESSION: 1. There is no acute intracranial abnormality. 2. Paranasal sinuses disease as described above. 3. There is scalp swelling and small scalp hematoma in right frontal region. 4. No orbital rim or orbital floor fracture. No facial fluid collection. Patent nasopharyngeal and oropharyngeal airway. 5. No mandibular fracture. No zygomatic fracture. No intraorbital hematoma. No TMJ dislocation. 6. No cervical spine acute fracture or subluxation. Mild degenerative changes C1-C2 articulation.   Electronically Signed   By: Lahoma Crocker M.D.   On: 11/22/2014 11:35   Nm Gastric Emptying  11/23/2014   CLINICAL DATA:  Nausea vomiting and in diffuse upper abdominal pain for the past several months, unexplained 40 lb weight loss  EXAM: NUCLEAR MEDICINE GASTRIC EMPTYING SCAN  TECHNIQUE: After oral ingestion of radiolabeled meal, sequential abdominal  images were obtained for 120 minutes. Residual percentage of activity remaining within the stomach was calculated at 60 and 120 minutes.  RADIOPHARMACEUTICALS:  2 mCi mCi Tc-35mMDP labeled sulfur colloid orally in two egg whites  COMPARISON:  CT scan of the abdomen and pelvis of October 25, 2014  FINDINGS: Expected location of the stomach in the left upper quadrant. Ingested meal empties the stomach gradually over the course of the study  with 45%% retention at 60 min and 8%% retention at 120 min (normal retention less than 30% at a 120 min).  IMPRESSION: Normal gastric emptying study.   Electronically Signed   By: David  JMartiniqueM.D.   On: 11/23/2014 12:28   Ct Maxillofacial Wo Cm  11/22/2014   CLINICAL DATA:  Fall, syncope and, frontal hematoma  EXAM: CT HEAD WITHOUT CONTRAST  CT MAXILLOFACIAL WITHOUT CONTRAST  CT CERVICAL SPINE WITHOUT CONTRAST  TECHNIQUE: Multidetector CT imaging of the head, cervical spine, and maxillofacial structures were performed using the standard protocol without intravenous contrast. Multiplanar CT image reconstructions of the cervical spine and maxillofacial structures were also generated.  COMPARISON:  09/17/2008  FINDINGS: CT HEAD FINDINGS  No skull fracture is noted. There is scalp swelling in right frontal region. Small subcutaneous hematoma right frontal scalp measures 2 cm length by 4 mm thickness.  No intracranial hemorrhage, mass effect or midline shift. No acute cortical infarction. No mass lesion is noted on this unenhanced scan. No intraventricular hemorrhage.  CT MAXILLOFACIAL FINDINGS  There is mucosal thickening with partial opacification bilateral ethmoid air cells. Mild mucosal thickening bilateral maxillary sinus and bilateral frontal sinus. There is bubbly opacification of the left maxillary sinus. Sinusitis cannot be excluded.  The mastoid air cells are unremarkable.  There is no nasal bone fracture. No intraorbital hematoma. No zygomatic fracture. No facial fluid collection. Bilateral eye globes are symmetrical in appearance. Coronal images shows no orbital floor or orbital rim fracture. There is mild left deviation of nasal bony septum. The nasal turbinates are unremarkable.  No mandibular fracture is noted.  There is no TMJ dislocation.  Sagittal images shows no maxillary spine fracture. The nasopharyngeal and oropharyngeal airway is patent.  CT CERVICAL SPINE FINDINGS  Axial images of the cervical  spine shows no acute fracture or subluxation. Computer processed images shows no acute fracture or subluxation. Mild degenerative changes are noted C1-C2 articulation. Alignment, and vertebral body heights are preserved. No prevertebral soft tissue swelling. Cervical airway is patent.  IMPRESSION: 1. There is no acute intracranial abnormality. 2. Paranasal sinuses disease as described above. 3. There is scalp swelling and small scalp hematoma in right frontal region. 4. No orbital rim or orbital floor fracture. No facial fluid collection. Patent nasopharyngeal and oropharyngeal airway. 5. No mandibular fracture. No zygomatic fracture. No intraorbital hematoma. No TMJ dislocation. 6. No cervical spine acute fracture or subluxation. Mild degenerative changes C1-C2 articulation.   Electronically Signed   By: LLahoma CrockerM.D.   On: 11/22/2014 11:35    ASSESMENT:   * Prolonged, chronicnausea and vomiting. Recent EGD, CT scan, ultrasound unrevealing. CT scan does show borderline hepatosplenomegaly. Labs reveal climbing alkaline phosphatase. T bili is slightly elevated but improved, previously this as well as transaminases all normal. GES study normal and sxs improved with starting once daily Protonix.   * Status post TAH/BSO 6/21 for dysfunctional uterine bleeding and enlarged fibroids. Second surgery same day, ex lap with evacuation of hematoma. Doing well at time of follow-up with surgeon 7/ 6/16.  Abdominal pain improved since surgery.  * IBS-C. On Amitiza for several years. Has bowel movements about 2 times per week.  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 Labs reveal hypokalemia: resolved; hyponatremia (non-critical). Magnesium level normal.  * Long-standing history of type I diabetes mellitus. Do not see a hemoglobin A1c.  * History sarcoidosis. Latest chest CT was 05/2006 showing stable, benign bilateral pulmonary nodules.    PLAN   *  Needs to take once daily PPI,  whatever her medicaid will pay for, does not have to be Nexium.  Please, no narcotics at discharge. I stopped the prn oxycodone, added Acetaminophen prn to prn Tramadol. fo abdominal pain can use heatng pad or ice pack if needed.   *  Has GI ROV set up for 7/26 with PA. Arranged for labwork/alk phos beforehand.     Azucena Freed  11/23/2014, 2:52 PM Pager: [phone]  GI ATTENDING  Interval history and data reviewed. Patient personally seen and examined. Gastric empty scan normal. No evidence for diabetic gastroparesis. She did have bowel movement this afternoon. She needs to go home on daily PPI. Also keep GI follow-up in 2 weeks to continue evaluation of her nausea vomiting as well as elevated alkaline phosphatase. Her primary GI is Dr. Deatra Ina. Discussed this with her. Will sign off.  Docia Chuck. Geri Seminole., M.D. [facility] Division of Gastroenterology

## 2014-11-23 NOTE — Progress Notes (Signed)
Initial Nutrition Assessment  DOCUMENTATION CODES:  Not applicable  INTERVENTION:  -Glucerna Shake po TID, each supplement provides 220 kcal and 10 grams of protein -MVI daily -Recommend recent Hgb A1c to assess glycemic control -Recommend advance diet to soft (low fiber) diet, with small, frequent meals   NUTRITION DIAGNOSIS:  Inadequate oral intake related to altered GI function as evidenced by per patient/family report, percent weight loss.  GOAL:  Patient will meet greater than or equal to 90% of their needs  MONITOR:  PO intake, Supplement acceptance, Diet advancement, Labs, Weight trends, Skin  REASON FOR ASSESSMENT:  Malnutrition Screening Tool, Consult Assessment of nutrition requirement/status  ASSESSMENT: [name] is a pleasant 44 year old woman with a history of long QT syndrome and a very strong family history of sudden cardiac death, status post ICD implantation. She also has a history of SVT and is status post catheter ablation. The family has known LQTS 2. The patient was on her way to her clinic appt.with Dr. Lovena Le this morning. While she was in the car she began to feel short of breath. She waited a few minutes then started to walk into the building. As she approached the elevator the SOB go worse. The next thing she remembers is being on the floor. She hit her head and has a large hematoma on her head with no acute intracranial or cervical spine injury. She has know elevation of Alkphos and reports decreased appetite, which has been for several months resulting in 40lb wt loss.  Pt admitted with torsades de pointe.  Attempted to examine pt x 2, however, pt down for procedure at time of visit. Pt awaiting gastric emptying study. Unable to complete Nutrition-Focused physical exam at this time.   Per chart review, pt with poor po intake PTA due to nausea and vomiting after eating. Wt hx reveals progressive wt loss over the past 6 months, including 27%  wt loss over the past 6 months-1 year and 6.7% wt loss over the past month. UBW around 190#.   Pt with 18 year history of DM. Noted hypoglycemic events in hospital- CBGS: 60-91 (likely due to NPO status). No current Hgb A1c available at this time. RD suspicious if glycemic control or delayed gastric empyting may be contributing to weight loss and symptoms.   Unable to diagnose malnutrition at this time, however, pt is at high nutritional risk.  Per MD notes, potential for discharge home today.   Height:  Ht Readings from Last 1 Encounters:  11/22/14 5\' 9"  (1.753 m)    Weight:  Wt Readings from Last 1 Encounters:  11/22/14 138 lb 3.7 oz (62.7 kg)    Ideal Body Weight:  56.9 kg  Wt Readings from Last 10 Encounters:  11/22/14 138 lb 3.7 oz (62.7 kg)  11/05/14 145 lb (65.772 kg)  10/22/14 148 lb 12.8 oz (67.495 kg)  10/03/14 155 lb (70.308 kg)  08/15/14 167 lb 6 oz (75.921 kg)  07/20/14 171 lb 12.8 oz (77.928 kg)  06/29/14 177 lb 3.2 oz (80.377 kg)  05/09/14 181 lb (82.101 kg)  03/07/14 190 lb (86.183 kg)  10/24/13 190 lb (86.183 kg)    BMI:  Body mass index is 20.4 kg/(m^2).  Estimated Nutritional Needs:  Kcal:  1900-2100  Protein:  90-115 grams  Fluid:  1.9-2.1 L  Skin:  Wound (see comment) (closed lower abdominal incision, rt head hematoma, st II buttocks)  Diet Order:  Diet Carb Modified Fluid consistency:: Thin; Room service appropriate?: Yes  EDUCATION  NEEDS:  No education needs identified at this time   Intake/Output Summary (Last 24 hours) at 11/23/14 1310 Last data filed at 11/23/14 1100  Gross per 24 hour  Intake 584.83 ml  Output   1800 ml  Net -1215.17 ml    Last BM:  PTA  Layza Summa A. Jimmye Norman, RD, LDN, CDE Pager: [phone] After hours Pager: [phone]

## 2014-11-23 NOTE — Progress Notes (Signed)
SUBJECTIVE: The patient is doing well today. With persistent headache, nausea improved.  At this time, she denies chest pain, shortness of breath.  CURRENT MEDICATIONS: . antiseptic oral rinse  7 mL Mouth Rinse BID  . atenolol  50 mg Oral BID  . budesonide (PULMICORT) nebulizer solution  0.5 mg Nebulization BID  . cycloSPORINE  1 drop Both Eyes BID  . dextrose      . feeding supplement (ENSURE ENLIVE)  237 mL Oral BID BM  . fluticasone  2 spray Each Nare Daily  . gabapentin  100 mg Oral TID  . insulin aspart  0-9 Units Subcutaneous TID WC  . insulin glargine  35 Units Subcutaneous QHS  . lubiprostone  24 mcg Oral BID WC  . olopatadine  1 drop Both Eyes BID  . pantoprazole  40 mg Oral Q0600   . dextrose 10 mL/hr at 11/23/14 0700    OBJECTIVE: Physical Exam: Filed Vitals:   11/23/14 0300 11/23/14 0400 11/23/14 0734 11/23/14 0824  BP: 149/90 149/90 143/87   Pulse: 85 87 87 95  Temp: 100 F (37.8 C)  100 F (37.8 C)   TempSrc: Oral  Oral   Resp: 23 25 20 23   Height:      Weight:      SpO2: 96% 97% 97% 99%    Intake/Output Summary (Last 24 hours) at 11/23/14 0837 Last data filed at 11/23/14 0700  Gross per 24 hour  Intake 544.83 ml  Output   1300 ml  Net -755.17 ml    Telemetry reveals sinus rhythm  GEN- The patient is well appearing, alert and oriented x 3 today.   Head- normocephalic, +right hematoma Eyes-  Sclera clear, conjunctiva pink Ears- hearing intact Oropharynx- clear Neck- supple, no JVP Lungs- Clear to ausculation bilaterally, normal work of breathing Heart- Regular rate and rhythm, no murmurs, rubs or gallops  GI- soft, NT, ND, + BS Extremities- no clubbing, cyanosis, or edema Skin- no rash or lesion Psych- euthymic mood, full affect Neuro- strength and sensation are intact  LABS: Basic Metabolic Panel:  Recent Labs  11/22/14 1025 11/22/14 1925 11/23/14 0333  NA  --  134* 132*  K  --  3.5 4.1  CL  --  97* 98*  CO2  --  29 27    GLUCOSE  --  82 72  BUN  --  <5* <5*  CREATININE  --  0.51 0.48  CALCIUM  --  8.1* 8.0*  MG 1.9  --  2.3   Liver Function Tests:  Recent Labs  11/22/14 1021 11/23/14 0333  AST 36 37  ALT 13* 13*  ALKPHOS 570* 597*  BILITOT 1.5* 1.4*  PROT 8.2* 7.4  ALBUMIN 2.1* 2.0*    CBC:  Recent Labs  11/22/14 1021 11/23/14 0333  WBC 12.2* 10.9*  NEUTROABS 9.4*  --   HGB 11.4* 10.9*  HCT 34.7* 33.7*  MCV 84.6 87.1  PLT 154 174   Thyroid Function Tests:  Recent Labs  11/22/14 1925  TSH 1.159    RADIOLOGY: Ct Head Wo Contrast 11/22/2014   CLINICAL DATA:  Fall, syncope and, frontal hematoma  EXAM: CT HEAD WITHOUT CONTRAST  CT MAXILLOFACIAL WITHOUT CONTRAST  CT CERVICAL SPINE WITHOUT CONTRAST  TECHNIQUE: Multidetector CT imaging of the head, cervical spine, and maxillofacial structures were performed using the standard protocol without intravenous contrast. Multiplanar CT image reconstructions of the cervical spine and maxillofacial structures were also generated.  COMPARISON:  09/17/2008  FINDINGS:  CT HEAD FINDINGS  No skull fracture is noted. There is scalp swelling in right frontal region. Small subcutaneous hematoma right frontal scalp measures 2 cm length by 4 mm thickness.  No intracranial hemorrhage, mass effect or midline shift. No acute cortical infarction. No mass lesion is noted on this unenhanced scan. No intraventricular hemorrhage.  CT MAXILLOFACIAL FINDINGS  There is mucosal thickening with partial opacification bilateral ethmoid air cells. Mild mucosal thickening bilateral maxillary sinus and bilateral frontal sinus. There is bubbly opacification of the left maxillary sinus. Sinusitis cannot be excluded.  The mastoid air cells are unremarkable.  There is no nasal bone fracture. No intraorbital hematoma. No zygomatic fracture. No facial fluid collection. Bilateral eye globes are symmetrical in appearance. Coronal images shows no orbital floor or orbital rim fracture. There is  mild left deviation of nasal bony septum. The nasal turbinates are unremarkable.  No mandibular fracture is noted.  There is no TMJ dislocation.  Sagittal images shows no maxillary spine fracture. The nasopharyngeal and oropharyngeal airway is patent.  CT CERVICAL SPINE FINDINGS  Axial images of the cervical spine shows no acute fracture or subluxation. Computer processed images shows no acute fracture or subluxation. Mild degenerative changes are noted C1-C2 articulation. Alignment, and vertebral body heights are preserved. No prevertebral soft tissue swelling. Cervical airway is patent.  IMPRESSION: 1. There is no acute intracranial abnormality. 2. Paranasal sinuses disease as described above. 3. There is scalp swelling and small scalp hematoma in right frontal region. 4. No orbital rim or orbital floor fracture. No facial fluid collection. Patent nasopharyngeal and oropharyngeal airway. 5. No mandibular fracture. No zygomatic fracture. No intraorbital hematoma. No TMJ dislocation. 6. No cervical spine acute fracture or subluxation. Mild degenerative changes C1-C2 articulation.   Electronically Signed   By: Lahoma Crocker M.D.   On: 11/22/2014 11:35   Ct Cervical Spine Wo Contrast 11/22/2014   CLINICAL DATA:  Fall, syncope and, frontal hematoma  EXAM: CT HEAD WITHOUT CONTRAST  CT MAXILLOFACIAL WITHOUT CONTRAST  CT CERVICAL SPINE WITHOUT CONTRAST  TECHNIQUE: Multidetector CT imaging of the head, cervical spine, and maxillofacial structures were performed using the standard protocol without intravenous contrast. Multiplanar CT image reconstructions of the cervical spine and maxillofacial structures were also generated.  COMPARISON:  09/17/2008  FINDINGS: CT HEAD FINDINGS  No skull fracture is noted. There is scalp swelling in right frontal region. Small subcutaneous hematoma right frontal scalp measures 2 cm length by 4 mm thickness.  No intracranial hemorrhage, mass effect or midline shift. No acute cortical  infarction. No mass lesion is noted on this unenhanced scan. No intraventricular hemorrhage.  CT MAXILLOFACIAL FINDINGS  There is mucosal thickening with partial opacification bilateral ethmoid air cells. Mild mucosal thickening bilateral maxillary sinus and bilateral frontal sinus. There is bubbly opacification of the left maxillary sinus. Sinusitis cannot be excluded.  The mastoid air cells are unremarkable.  There is no nasal bone fracture. No intraorbital hematoma. No zygomatic fracture. No facial fluid collection. Bilateral eye globes are symmetrical in appearance. Coronal images shows no orbital floor or orbital rim fracture. There is mild left deviation of nasal bony septum. The nasal turbinates are unremarkable.  No mandibular fracture is noted.  There is no TMJ dislocation.  Sagittal images shows no maxillary spine fracture. The nasopharyngeal and oropharyngeal airway is patent.  CT CERVICAL SPINE FINDINGS  Axial images of the cervical spine shows no acute fracture or subluxation. Computer processed images shows no acute fracture or subluxation. Mild degenerative  changes are noted C1-C2 articulation. Alignment, and vertebral body heights are preserved. No prevertebral soft tissue swelling. Cervical airway is patent.  IMPRESSION: 1. There is no acute intracranial abnormality. 2. Paranasal sinuses disease as described above. 3. There is scalp swelling and small scalp hematoma in right frontal region. 4. No orbital rim or orbital floor fracture. No facial fluid collection. Patent nasopharyngeal and oropharyngeal airway. 5. No mandibular fracture. No zygomatic fracture. No intraorbital hematoma. No TMJ dislocation. 6. No cervical spine acute fracture or subluxation. Mild degenerative changes C1-C2 articulation.   Electronically Signed   By: Lahoma Crocker M.D.   On: 11/22/2014 11:35   ASSESSMENT AND PLAN:  Principal Problem:   Torsades de pointes Active Problems:   Diabetes mellitus, type 2   Essential  hypertension   SYNDROME, LONG QT   Automatic implantable cardioverter-defibrillator in situ   Traumatic hematoma of head   1.  Torsades/LongQT syndrome Likely 2/2 hypokalemia - now replete Pt aware to avoid QT prolonging medications Continue BB No driving x6 months (pt aware)  2.  Hypokalemia Now replete  3.  Nausea/vomiting/weight loss GI consult yesterday appreciated For gastric emptying scan today  From EP standpoint, patient would probably be ready for discharge later today. Will await GI recommendations following gastric emptying scan.   Chanetta Marshall, NP 11/23/2014 8:42 AM  I have seen, examined the patient, and reviewed the above assessment and plan.  On exam, sleeping but rouses.  Changes to above are made where necessary.  Pt feels that she needs another day.  She is "too weak and tired".  Gastric emptying study was normal.  No further inpatient workup planned per GI. Continue PPI (per GI) at discharge.  Anticipate discharge in am.  No driving x 6 months (pt aware).  Transfer to telemetry.  Plan to discharge in am.  Co Sign: Thompson Grayer, MD 11/23/2014 2:28 PM

## 2014-11-24 ENCOUNTER — Other Ambulatory Visit: Payer: Self-pay

## 2014-11-24 DIAGNOSIS — E876 Hypokalemia: Secondary | ICD-10-CM | POA: Diagnosis present

## 2014-11-24 LAB — GLUCOSE, CAPILLARY
GLUCOSE-CAPILLARY: 116 mg/dL — AB (ref 65–99)
Glucose-Capillary: 132 mg/dL — ABNORMAL HIGH (ref 65–99)

## 2014-11-24 MED ORDER — PANTOPRAZOLE SODIUM 40 MG PO TBEC: 40.0000 mg | DELAYED_RELEASE_TABLET | Freq: Every day | ORAL | Status: DC

## 2014-11-24 MED ORDER — POTASSIUM CHLORIDE CRYS ER 20 MEQ PO TBCR: 20.0000 meq | EXTENDED_RELEASE_TABLET | Freq: Every day | ORAL | Status: DC

## 2014-11-24 MED ORDER — GLUCERNA SHAKE PO LIQD: 237.0000 mL | Freq: Two times a day (BID) | ORAL | Status: DC

## 2014-11-24 NOTE — Discharge Summary (Signed)
CARDIOLOGY DISCHARGE SUMMARY   Patient ID: [name] MRN: [id number] DOB/AGE: December 07, 1970 44 y.o.  Admit date: 11/22/2014 Discharge date: 11/24/2014  PCP: Frederico Hamman, MD Primary Cardiologist: Dr. Lovena Le  Primary Discharge Diagnosis:  Torsades de pointes Secondary Discharge Diagnosis:    Diabetes mellitus, type 2   Essential hypertension   SYNDROME, LONG QT   Automatic implantable cardioverter-defibrillator in situ   Traumatic hematoma of head   Pressure ulcer   Weight loss   Nausea with vomiting   Elevated alkaline phosphatase level   Slow transit constipation   Hypokalemia   Consults: GI  Procedures: CT of the abdomen and pelvis without contrast, CT of the head and cervical spine without contrast, maxillofacial CT without contrast, gastric emptying study   Hospital Course: [name] is a 44 y.o. female with a history of long QT syndrome, family history of sudden death and LQTS 2, SVT with subsequent ablation, elevated alkaline phosphatase, IBS-C and [employer] ICD. She came to the clinic for scheduled appointment on the day of admission and became short of breath and weak. She then had a syncopal episode with associated hematoma. She was taken to the emergency room and then admitted for further evaluation and treatment.  Her admission labs showed a potassium of 2.8. This was repleted. The hypokalemia was felt to be secondary to poor nutritional status. She is encouraged to improve this.  Her device was interrogated and showed 3 ICD discharges today and one on June 15. These were secondary to torsades. Her potassium was repleted. Her magnesium level was checked and was normal. Her QTC was 461. She will be discharged on potassium supplementation and get her rechecked as an outpatient.  She had been eating poorly and it was felt that poor nutritional status was the cause of her electrolyte abnormalities. A GI consult was called. An abdominal CT scan did not  show any reason for her abdominal pain. Her alkaline phosphatase was checked and was elevated, 508 on admission and 597 at discharge. Her total bilirubin was initially 1.1 but by discharge was 1.4. Her nutritional status is felt to be poor because her albumen was 2.0. She will be encouraged to eat protein at every meal and takes supplements regularly. She was seen by Dr. Henrene Pastor and a gastric emptying study was performed which was normal. Dr. Henrene Pastor recommended a proton pump inhibitor, and no narcotics at discharge. She was having intermittent abdominal pain and she is to use a heating pad or ice pack if needed. Tramadol or Tylenol is also okay. She will be seen by GI as an outpatient.  Her diabetes was managed with a combination of home meds plus sliding scale. She did not have gastroparesis on her gastric emptying study.  Because of injuries sustained when she fell because of her torsades, scans of her head and neck were performed which showed the hematoma but no acute fractures.  On 11/24/2014, she was seen by Dr. Wynonia Lawman and all data were reviewed. No further inpatient workup is indicated and she is considered stable for discharge, to follow up as an outpatient. GI recommendations including no narcotics and a proton pump inhibitor were followed.  Labs:   Lab Results  Component Value Date   WBC 10.9* 11/23/2014   HGB 10.9* 11/23/2014   HCT 33.7* 11/23/2014   MCV 87.1 11/23/2014   PLT 174 11/23/2014     Recent Labs Lab 11/23/14 0333  NA 132*  K 4.1  CL 98*  CO2 27  BUN <5*  CREATININE 0.48  CALCIUM 8.0*  PROT 7.4  BILITOT 1.4*  ALKPHOS 597*  ALT 13*  AST 37  GLUCOSE 72      Radiology: Ct Abdomen Pelvis Wo Contrast 10/25/2014   CLINICAL DATA:  Five months of worsening abdominal pain, nausea and vomiting worse postprandial, 40 lb weight loss, history irritable bowel syndrome on medication, sarcoidosis, asthma, GERD, diabetes mellitus, uterine fibroids, stroke  EXAM: CT ABDOMEN AND  PELVIS WITHOUT CONTRAST  TECHNIQUE: Multidetector CT imaging of the abdomen and pelvis was performed following the standard protocol without IV contrast. Sagittal and coronal MPR images reconstructed from axial data set. IV contrast not utilized due to history of contrast allergy. Dilute oral contrast administered.  COMPARISON:  None ; correlation ultrasound abdomen 10/08/2014  FINDINGS: Lung bases clear.  AICD lead RIGHT ventricle.  Liver subjectively borderline enlarged.  Spleen measures 13.4 x 6.4 x 10.9 cm.  Within limitations of a nonenhanced exam no additional abnormalities of the liver, spleen, gallbladder, pancreas, kidneys, or adrenal glands.  Enlarged uterus questionably containing a large LEFT side leiomyoma 6.6 x 4.9 cm.  Bladder and adnexa normal appearance.  Normal appearance, long, extending into deep RIGHT pelvis.  Stomach and bowel loops normal appearance.  No mass, adenopathy, free fluid or inflammatory process.  Osseous structures unremarkable.  IMPRESSION: Probable mild hepatosplenomegaly.  Enlarged uterus questionably containing a large LEFT side leiomyoma 6.6 x 4.9 cm.  No other definite intra-abdominal or intrapelvic abnormalities.   Electronically Signed   By: Lavonia Dana M.D.   On: 10/25/2014 14:07   Ct Head Wo Contrast 11/22/2014   CLINICAL DATA:  Fall, syncope and, frontal hematoma  EXAM: CT HEAD WITHOUT CONTRAST  CT MAXILLOFACIAL WITHOUT CONTRAST  CT CERVICAL SPINE WITHOUT CONTRAST  TECHNIQUE: Multidetector CT imaging of the head, cervical spine, and maxillofacial structures were performed using the standard protocol without intravenous contrast. Multiplanar CT image reconstructions of the cervical spine and maxillofacial structures were also generated.  COMPARISON:  09/17/2008  FINDINGS: CT HEAD FINDINGS  No skull fracture is noted. There is scalp swelling in right frontal region. Small subcutaneous hematoma right frontal scalp measures 2 cm length by 4 mm thickness.  No intracranial  hemorrhage, mass effect or midline shift. No acute cortical infarction. No mass lesion is noted on this unenhanced scan. No intraventricular hemorrhage.  CT MAXILLOFACIAL FINDINGS  There is mucosal thickening with partial opacification bilateral ethmoid air cells. Mild mucosal thickening bilateral maxillary sinus and bilateral frontal sinus. There is bubbly opacification of the left maxillary sinus. Sinusitis cannot be excluded.  The mastoid air cells are unremarkable.  There is no nasal bone fracture. No intraorbital hematoma. No zygomatic fracture. No facial fluid collection. Bilateral eye globes are symmetrical in appearance. Coronal images shows no orbital floor or orbital rim fracture. There is mild left deviation of nasal bony septum. The nasal turbinates are unremarkable.  No mandibular fracture is noted.  There is no TMJ dislocation.  Sagittal images shows no maxillary spine fracture. The nasopharyngeal and oropharyngeal airway is patent.  CT CERVICAL SPINE FINDINGS  Axial images of the cervical spine shows no acute fracture or subluxation. Computer processed images shows no acute fracture or subluxation. Mild degenerative changes are noted C1-C2 articulation. Alignment, and vertebral body heights are preserved. No prevertebral soft tissue swelling. Cervical airway is patent.  IMPRESSION: 1. There is no acute intracranial abnormality. 2. Paranasal sinuses disease as described above. 3. There is scalp swelling and small scalp  hematoma in right frontal region. 4. No orbital rim or orbital floor fracture. No facial fluid collection. Patent nasopharyngeal and oropharyngeal airway. 5. No mandibular fracture. No zygomatic fracture. No intraorbital hematoma. No TMJ dislocation. 6. No cervical spine acute fracture or subluxation. Mild degenerative changes C1-C2 articulation.   Electronically Signed   By: Lahoma Crocker M.D.   On: 11/22/2014 11:35   Ct Cervical Spine Wo Contrast 11/22/2014   CLINICAL DATA:  Fall,  syncope and, frontal hematoma  EXAM: CT HEAD WITHOUT CONTRAST  CT MAXILLOFACIAL WITHOUT CONTRAST  CT CERVICAL SPINE WITHOUT CONTRAST  TECHNIQUE: Multidetector CT imaging of the head, cervical spine, and maxillofacial structures were performed using the standard protocol without intravenous contrast. Multiplanar CT image reconstructions of the cervical spine and maxillofacial structures were also generated.  COMPARISON:  09/17/2008  FINDINGS: CT HEAD FINDINGS  No skull fracture is noted. There is scalp swelling in right frontal region. Small subcutaneous hematoma right frontal scalp measures 2 cm length by 4 mm thickness.  No intracranial hemorrhage, mass effect or midline shift. No acute cortical infarction. No mass lesion is noted on this unenhanced scan. No intraventricular hemorrhage.  CT MAXILLOFACIAL FINDINGS  There is mucosal thickening with partial opacification bilateral ethmoid air cells. Mild mucosal thickening bilateral maxillary sinus and bilateral frontal sinus. There is bubbly opacification of the left maxillary sinus. Sinusitis cannot be excluded.  The mastoid air cells are unremarkable.  There is no nasal bone fracture. No intraorbital hematoma. No zygomatic fracture. No facial fluid collection. Bilateral eye globes are symmetrical in appearance. Coronal images shows no orbital floor or orbital rim fracture. There is mild left deviation of nasal bony septum. The nasal turbinates are unremarkable.  No mandibular fracture is noted.  There is no TMJ dislocation.  Sagittal images shows no maxillary spine fracture. The nasopharyngeal and oropharyngeal airway is patent.  CT CERVICAL SPINE FINDINGS  Axial images of the cervical spine shows no acute fracture or subluxation. Computer processed images shows no acute fracture or subluxation. Mild degenerative changes are noted C1-C2 articulation. Alignment, and vertebral body heights are preserved. No prevertebral soft tissue swelling. Cervical airway is  patent.  IMPRESSION: 1. There is no acute intracranial abnormality. 2. Paranasal sinuses disease as described above. 3. There is scalp swelling and small scalp hematoma in right frontal region. 4. No orbital rim or orbital floor fracture. No facial fluid collection. Patent nasopharyngeal and oropharyngeal airway. 5. No mandibular fracture. No zygomatic fracture. No intraorbital hematoma. No TMJ dislocation. 6. No cervical spine acute fracture or subluxation. Mild degenerative changes C1-C2 articulation.   Electronically Signed   By: Lahoma Crocker M.D.   On: 11/22/2014 11:35   Nm Gastric Emptying 11/23/2014   CLINICAL DATA:  Nausea vomiting and in diffuse upper abdominal pain for the past several months, unexplained 40 lb weight loss  EXAM: NUCLEAR MEDICINE GASTRIC EMPTYING SCAN  TECHNIQUE: After oral ingestion of radiolabeled meal, sequential abdominal images were obtained for 120 minutes. Residual percentage of activity remaining within the stomach was calculated at 60 and 120 minutes.  RADIOPHARMACEUTICALS:  2 mCi mCi Tc-93m MDP labeled sulfur colloid orally in two egg whites  COMPARISON:  CT scan of the abdomen and pelvis of October 25, 2014  FINDINGS: Expected location of the stomach in the left upper quadrant. Ingested meal empties the stomach gradually over the course of the study with 45%% retention at 60 min and 8%% retention at 120 min (normal retention less than 30% at a 120 min).  IMPRESSION: Normal gastric emptying study.   Electronically Signed   By: David  Martinique M.D.   On: 11/23/2014 12:28   Ct Maxillofacial Wo Cm 11/22/2014   CLINICAL DATA:  Fall, syncope and, frontal hematoma  EXAM: CT HEAD WITHOUT CONTRAST  CT MAXILLOFACIAL WITHOUT CONTRAST  CT CERVICAL SPINE WITHOUT CONTRAST  TECHNIQUE: Multidetector CT imaging of the head, cervical spine, and maxillofacial structures were performed using the standard protocol without intravenous contrast. Multiplanar CT image reconstructions of the cervical spine and  maxillofacial structures were also generated.  COMPARISON:  09/17/2008  FINDINGS: CT HEAD FINDINGS  No skull fracture is noted. There is scalp swelling in right frontal region. Small subcutaneous hematoma right frontal scalp measures 2 cm length by 4 mm thickness.  No intracranial hemorrhage, mass effect or midline shift. No acute cortical infarction. No mass lesion is noted on this unenhanced scan. No intraventricular hemorrhage.  CT MAXILLOFACIAL FINDINGS  There is mucosal thickening with partial opacification bilateral ethmoid air cells. Mild mucosal thickening bilateral maxillary sinus and bilateral frontal sinus. There is bubbly opacification of the left maxillary sinus. Sinusitis cannot be excluded.  The mastoid air cells are unremarkable.  There is no nasal bone fracture. No intraorbital hematoma. No zygomatic fracture. No facial fluid collection. Bilateral eye globes are symmetrical in appearance. Coronal images shows no orbital floor or orbital rim fracture. There is mild left deviation of nasal bony septum. The nasal turbinates are unremarkable.  No mandibular fracture is noted.  There is no TMJ dislocation.  Sagittal images shows no maxillary spine fracture. The nasopharyngeal and oropharyngeal airway is patent.  CT CERVICAL SPINE FINDINGS  Axial images of the cervical spine shows no acute fracture or subluxation. Computer processed images shows no acute fracture or subluxation. Mild degenerative changes are noted C1-C2 articulation. Alignment, and vertebral body heights are preserved. No prevertebral soft tissue swelling. Cervical airway is patent.  IMPRESSION: 1. There is no acute intracranial abnormality. 2. Paranasal sinuses disease as described above. 3. There is scalp swelling and small scalp hematoma in right frontal region. 4. No orbital rim or orbital floor fracture. No facial fluid collection. Patent nasopharyngeal and oropharyngeal airway. 5. No mandibular fracture. No zygomatic fracture. No  intraorbital hematoma. No TMJ dislocation. 6. No cervical spine acute fracture or subluxation. Mild degenerative changes C1-C2 articulation.   Electronically Signed   By: Lahoma Crocker M.D.   On: 11/22/2014 11:35   EKG: 11/22/2014 Sinus tachycardia, rate 102 Left anterior fascicular block, late R-wave progression  FOLLOW UP PLANS AND APPOINTMENTS Allergies  Allergen Reactions  . Contrast Media [Iodinated Diagnostic Agents] Anaphylaxis and Shortness Of Breath    Needed to be defibrillated   . Augmentin [Amoxicillin-Pot Clavulanate] Other (See Comments)    "stomach hurt"  . Codeine Nausea And Vomiting    jittery  . Iohexol      Code: RASH, Desc: PT STATES SHE IS ALLERGIC TO IV CONTRAST 05/28/06/RM, Onset Date: 13244010   . Meperidine Hcl Nausea And Vomiting  . Morphine And Related Nausea And Vomiting     Medication List    TAKE these medications        atenolol 50 MG tablet  Commonly known as:  TENORMIN  Take 1 tablet (50 mg total) by mouth 2 (two) times daily.     beclomethasone 80 MCG/ACT inhaler  Commonly known as:  QVAR  Inhale 2 puffs into the lungs 2 (two) times daily.     betamethasone dipropionate 0.05 % cream  Commonly known  as:  DIPROLENE  Apply topically 2 (two) times daily.     cetirizine 10 MG tablet  Commonly known as:  ZYRTEC  Take 10 mg by mouth daily as needed for allergies.     clonazePAM 0.5 MG tablet  Commonly known as:  KLONOPIN  Take 0.5 mg by mouth daily.     cyclobenzaprine 10 MG tablet  Commonly known as:  FLEXERIL  Take 10 mg by mouth 3 (three) times daily as needed for muscle spasms.     cycloSPORINE 0.05 % ophthalmic emulsion  Commonly known as:  RESTASIS  Place 1 drop into both eyes 2 (two) times daily.     diclofenac sodium 1 % Gel  Commonly known as:  VOLTAREN  Apply 2 g topically 2 (two) times daily as needed (pain).     feeding supplement (GLUCERNA SHAKE) Liqd  Take 237 mLs by mouth 2 (two) times daily between meals.      fluticasone 50 MCG/ACT nasal spray  Commonly known as:  FLONASE  Place 2 sprays into both nostrils daily.     gabapentin 100 MG capsule  Commonly known as:  NEURONTIN  Take 1 capsule (100 mg total) by mouth 3 (three) times daily.     glimepiride 2 MG tablet  Commonly known as:  AMARYL  Take 2 mg by mouth 2 (two) times daily at 8 am and 10 pm.     HYDROcodone-homatropine 5-1.5 MG/5ML syrup  Commonly known as:  HYCODAN  Take 5 mLs by mouth every 6 (six) hours as needed for cough.     insulin aspart 100 UNIT/ML injection  Commonly known as:  novoLOG  Inject 4-8 Units into the skin 3 (three) times daily before meals. Sliding scale     insulin glargine 100 UNIT/ML injection  Commonly known as:  LANTUS  Inject 35 Units into the skin at bedtime.     lubiprostone 8 MCG capsule  Commonly known as:  AMITIZA  Take 3 capsules (24 mcg total) by mouth 2 (two) times daily with a meal.     ondansetron 4 MG tablet  Commonly known as:  ZOFRAN  Take 1 tablet (4 mg total) by mouth every 8 (eight) hours as needed for nausea or vomiting.     pantoprazole 40 MG tablet  Commonly known as:  PROTONIX  Take 1 tablet (40 mg total) by mouth daily at 6 (six) AM.     PATADAY 0.2 % Soln  Generic drug:  Olopatadine HCl  Place 1 drop into both eyes 2 (two) times daily.     potassium chloride SA 20 MEQ tablet  Commonly known as:  K-DUR,KLOR-CON  Take 1 tablet (20 mEq total) by mouth daily.        Discharge Instructions    Diet - low sodium heart healthy    Complete by:  As directed      Diet Carb Modified    Complete by:  As directed      Increase activity slowly    Complete by:  As directed           Follow-up Information    Follow up with ZEHR, JESSICA D., PA-C On 12/11/2014.   Specialty:  Gastroenterology   Why:  10:30 AM.  GI office, with Dr Kelby Fam PA.    Contact information:   [address] [place] [address] [phone]       Go to [facility].   Why:  go to lab  in basement  of [place] office on july 20, 21, 22 or 25 for lab work that needs to be done before the appt on 7/26.  thanks.    Contact information:   [place] [address]       Follow up with Cristopher Peru, MD.   Specialty:  Cardiology   Why:  The office will call.   Contact information:   [address] [place] [address] [phone]       BRING ALL MEDICATIONS WITH YOU TO FOLLOW UP APPOINTMENTS  Time spent with patient to include physician time: 41 min Signed: Rosaria Ferries, PA-C 11/24/2014, 11:38 AM Co-Sign MD

## 2014-11-24 NOTE — Progress Notes (Signed)
Subjective:  Anxious about her overall situation.  Her potassium is normal today.  No recurrent torsade now the potassium has been repleted.  Gastric emptying study is normal.  Objective:  Vital Signs in the last 24 hours: BP 109/68 mmHg  Pulse 72  Temp(Src) 98.5 F (36.9 C) (Oral)  Resp 18  Ht 5\' 9"  (1.753 m)  Wt 60.51 kg (133 lb 6.4 oz)  BMI 19.69 kg/m2  SpO2 97%  Physical Exam: Thin black female in no acute distress Lungs:  Clear  Cardiac:  Regular rhythm, normal S1 and S2, no S3 Extremities:  No edema present  Intake/Output from previous day: 07/08 0701 - 07/09 0700 In: 400 [P.O.:360; I.V.:40] Out: 500 [Urine:500]   Weight Filed Weights   11/22/14 1800 11/24/14 0400  Weight: 62.7 kg (138 lb 3.7 oz) 60.51 kg (133 lb 6.4 oz)   Lab Results: Basic Metabolic Panel:  Recent Labs  11/22/14 1925 11/23/14 0333  NA 134* 132*  K 3.5 4.1  CL 97* 98*  CO2 29 27  GLUCOSE 82 72  BUN <5* <5*  CREATININE 0.51 0.48   CBC:  Recent Labs  11/22/14 1021 11/23/14 0333  WBC 12.2* 10.9*  NEUTROABS 9.4*  --   HGB 11.4* 10.9*  HCT 34.7* 33.7*  MCV 84.6 87.1  PLT 154 174   Telemetry: Sinus rhythm  Assessment/Plan:  1.  Torsade deployments associated with long QT syndrome precipitated by hypokalemia-resolved 2.  Nausea and vomiting-resolved-she is hesitant but willing to go home  Recommendations:  Think she is okay to be discharged today.  Will need to go home on potassium with early follow-up with Dr. Lovena Le.      Kerry Hough  MD [facility]  11/24/2014, 8:18 AM

## 2014-11-26 ENCOUNTER — Other Ambulatory Visit: Payer: Self-pay

## 2014-11-26 ENCOUNTER — Telehealth: Payer: Self-pay | Admitting: Internal Medicine

## 2014-11-26 DIAGNOSIS — Z95810 Presence of automatic (implantable) cardiac defibrillator: Secondary | ICD-10-CM

## 2014-11-26 DIAGNOSIS — I4581 Long QT syndrome: Secondary | ICD-10-CM

## 2014-11-26 MED ORDER — ATENOLOL 50 MG PO TABS: 50.0000 mg | ORAL_TABLET | Freq: Two times a day (BID) | ORAL | Status: DC

## 2014-11-26 NOTE — Telephone Encounter (Signed)
New Message  Pt wanted to confirm her refill of atenolol after sched appt ([facility])  w/ DR. Taylor for 7/25. Please call back and discuss.

## 2014-11-27 LAB — ALDOSTERONE + RENIN ACTIVITY W/ RATIO
Aldosterone: <1 ng/dL (ref 0.0–30.0)
PRA LC/MS/MS: 0.24 ng/mL/hr

## 2014-12-06 ENCOUNTER — Other Ambulatory Visit (INDEPENDENT_AMBULATORY_CARE_PROVIDER_SITE_OTHER): Payer: Medicaid Other

## 2014-12-06 DIAGNOSIS — R748 Abnormal levels of other serum enzymes: Secondary | ICD-10-CM | POA: Diagnosis not present

## 2014-12-06 LAB — HEPATIC FUNCTION PANEL
ALT: 26 U/L (ref 0–35)
AST: 37 U/L (ref 0–37)
Albumin: 3.2 g/dL — ABNORMAL LOW (ref 3.5–5.2)
Alkaline Phosphatase: 1047 U/L — ABNORMAL HIGH (ref 39–117)
Bilirubin, Direct: 0.9 mg/dL — ABNORMAL HIGH (ref 0.0–0.3)
TOTAL PROTEIN: 9.3 g/dL — AB (ref 6.0–8.3)
Total Bilirubin: 1.8 mg/dL — ABNORMAL HIGH (ref 0.2–1.2)

## 2014-12-10 ENCOUNTER — Ambulatory Visit (INDEPENDENT_AMBULATORY_CARE_PROVIDER_SITE_OTHER): Payer: Medicaid Other | Admitting: Internal Medicine

## 2014-12-10 ENCOUNTER — Encounter: Payer: Self-pay | Admitting: Internal Medicine

## 2014-12-10 VITALS — BP 98/58 | HR 78 | Ht 69.0 in | Wt 130.0 lb

## 2014-12-10 DIAGNOSIS — I4581 Long QT syndrome: Secondary | ICD-10-CM

## 2014-12-10 DIAGNOSIS — R634 Abnormal weight loss: Secondary | ICD-10-CM | POA: Diagnosis not present

## 2014-12-10 DIAGNOSIS — R0602 Shortness of breath: Secondary | ICD-10-CM

## 2014-12-10 DIAGNOSIS — I471 Supraventricular tachycardia, unspecified: Secondary | ICD-10-CM

## 2014-12-10 DIAGNOSIS — Z95810 Presence of automatic (implantable) cardiac defibrillator: Secondary | ICD-10-CM

## 2014-12-10 DIAGNOSIS — R0600 Dyspnea, unspecified: Secondary | ICD-10-CM

## 2014-12-10 LAB — CUP PACEART INCLINIC DEVICE CHECK
Brady Statistic RV Percent Paced: 0 %
HIGH POWER IMPEDANCE MEASURED VALUE: 46 Ohm
HIGH POWER IMPEDANCE MEASURED VALUE: 47 Ohm
HIGH POWER IMPEDANCE MEASURED VALUE: 48 Ohm
HIGH POWER IMPEDANCE MEASURED VALUE: 48 Ohm
HIGH POWER IMPEDANCE MEASURED VALUE: 52 Ohm
HIGH POWER IMPEDANCE MEASURED VALUE: 58 Ohm
HIGH POWER IMPEDANCE MEASURED VALUE: 58 Ohm
HIGH POWER IMPEDANCE MEASURED VALUE: 61 Ohm
HIGH POWER IMPEDANCE MEASURED VALUE: 62 Ohm
HIGH POWER IMPEDANCE MEASURED VALUE: 65 Ohm
HIGH POWER IMPEDANCE MEASURED VALUE: 65 Ohm
HighPow Impedance: 41 Ohm
HighPow Impedance: 43 Ohm
HighPow Impedance: 43 Ohm
HighPow Impedance: 45 Ohm
HighPow Impedance: 45 Ohm
HighPow Impedance: 46 Ohm
HighPow Impedance: 47 Ohm
HighPow Impedance: 47 Ohm
HighPow Impedance: 47 Ohm
HighPow Impedance: 48 Ohm
HighPow Impedance: 51 Ohm
HighPow Impedance: 63 Ohm
HighPow Impedance: 63 Ohm
HighPow Impedance: 63 Ohm
HighPow Impedance: 64 Ohm
HighPow Impedance: 65 Ohm
HighPow Impedance: 65 Ohm
HighPow Impedance: 66 Ohm
HighPow Impedance: 70 Ohm
HighPow Impedance: 75 Ohm
Lead Channel Impedance Value: 392 Ohm
Lead Channel Impedance Value: 408 Ohm
Lead Channel Impedance Value: 408 Ohm
Lead Channel Impedance Value: 408 Ohm
Lead Channel Impedance Value: 416 Ohm
Lead Channel Impedance Value: 416 Ohm
Lead Channel Impedance Value: 424 Ohm
Lead Channel Impedance Value: 432 Ohm
Lead Channel Impedance Value: 432 Ohm
Lead Channel Impedance Value: 440 Ohm
Lead Channel Impedance Value: 448 Ohm
Lead Channel Impedance Value: 456 Ohm
Lead Channel Pacing Threshold Pulse Width: 0.8 ms
Lead Channel Sensing Intrinsic Amplitude: 5.4 mV
Lead Channel Sensing Intrinsic Amplitude: 5.5 mV
Lead Channel Sensing Intrinsic Amplitude: 5.8 mV
Lead Channel Sensing Intrinsic Amplitude: 6.1 mV
Lead Channel Sensing Intrinsic Amplitude: 6.3 mV
Lead Channel Sensing Intrinsic Amplitude: 6.6 mV
Lead Channel Sensing Intrinsic Amplitude: 7.6 mV
Lead Channel Sensing Intrinsic Amplitude: 7.8 mV
Lead Channel Sensing Intrinsic Amplitude: 8.1 mV
Lead Channel Setting Sensing Sensitivity: 0.3 mV
MDC IDC MSMT BATTERY VOLTAGE: 2.64 V
MDC IDC MSMT LEADCHNL RV IMPEDANCE VALUE: 424 Ohm
MDC IDC MSMT LEADCHNL RV IMPEDANCE VALUE: 432 Ohm
MDC IDC MSMT LEADCHNL RV IMPEDANCE VALUE: 464 Ohm
MDC IDC MSMT LEADCHNL RV PACING THRESHOLD AMPLITUDE: 1 V
MDC IDC MSMT LEADCHNL RV SENSING INTR AMPL: 5.6 mV
MDC IDC MSMT LEADCHNL RV SENSING INTR AMPL: 7 mV
MDC IDC MSMT LEADCHNL RV SENSING INTR AMPL: 7.4 mV
MDC IDC MSMT LEADCHNL RV SENSING INTR AMPL: 7.4 mV
MDC IDC MSMT LEADCHNL RV SENSING INTR AMPL: 7.6 mV
MDC IDC MSMT LEADCHNL RV SENSING INTR AMPL: 8 mV
MDC IDC SESS DTM: 20160725170009
MDC IDC SET LEADCHNL RV PACING AMPLITUDE: 2.5 V
MDC IDC SET LEADCHNL RV PACING PULSEWIDTH: 0.6 ms
MDC IDC SET ZONE DETECTION INTERVAL: 250 ms
MDC IDC SET ZONE DETECTION INTERVAL: 370 ms
Zone Setting Detection Interval: 300 ms

## 2014-12-10 MED ORDER — FUROSEMIDE 20 MG PO TABS: 20.0000 mg | ORAL_TABLET | Freq: Every day | ORAL | Status: DC

## 2014-12-10 NOTE — Assessment & Plan Note (Signed)
She has lost 60 lbs in the past year. The etiology remains unclear. She is pending appointment with Dr. Deatra Ina tomorrow.

## 2014-12-10 NOTE — Assessment & Plan Note (Signed)
Her device is approaching ERI. She will followup in several months.

## 2014-12-10 NOTE — Progress Notes (Signed)
HPI [name] returns today for followup. She is a pleasant 44 year old woman with a history of long QT syndrome and a very strong family history of sudden cardiac death, status post ICD implantation. She also has a history of SVT and is status post catheter ablation. 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 She has dyspnea with exertion and has had an echo at [facility] demonstrationg normal LV function. She was at [facility] undergoing hysterectomy of a leiomioma. She has early satiety and some anorexia.  Allergies  Allergen Reactions  . Contrast Media [Iodinated Diagnostic Agents] Anaphylaxis and Shortness Of Breath    Needed to be defibrillated   . Augmentin [Amoxicillin-Pot Clavulanate] Other (See Comments)    "stomach hurt"  . Codeine Nausea And Vomiting    jittery  . Iohexol      Code: RASH, Desc: PT STATES SHE IS ALLERGIC TO IV CONTRAST 05/28/06/RM, Onset Date: [id number]   . Meperidine Hcl Nausea And Vomiting  . Morphine And Related Nausea And Vomiting     Current Outpatient Prescriptions  Medication Sig Dispense Refill  . atenolol (TENORMIN) 50 MG tablet Take 1 tablet (50 mg total) by mouth 2 (two) times daily. 60 tablet 1  . beclomethasone (QVAR) 80 MCG/ACT inhaler Inhale 2 puffs into the lungs 2 (two) times daily. 1 Inhaler 5  . clonazePAM (KLONOPIN) 0.5 MG tablet Take 0.5 mg by mouth daily.  0  . cyclobenzaprine (FLEXERIL) 10 MG tablet Take 10 mg by mouth 3 (three) times daily as needed for muscle spasms.     . cycloSPORINE (RESTASIS) 0.05 % ophthalmic emulsion Place 1 drop into both eyes 2 (two) times daily.     . fluticasone (FLONASE) 50 MCG/ACT nasal spray Place 2 sprays into both nostrils daily.    Marland Kitchen gabapentin (NEURONTIN) 100 MG capsule Take 1 capsule (100 mg total) by mouth 3 (three) times daily. 90 capsule 2  . glimepiride (AMARYL) 2 MG tablet Take 2 mg by mouth 2 (two) times daily at 8 am and 10 pm.     . insulin  aspart (NOVOLOG) 100 UNIT/ML injection Inject 4-8 Units into the skin 3 (three) times daily before meals. Sliding scale    . insulin glargine (LANTUS) 100 UNIT/ML injection Inject 35 Units into the skin at bedtime.     Marland Kitchen lubiprostone (AMITIZA) 8 MCG capsule Take 3 capsules (24 mcg total) by mouth 2 (two) times daily with a meal. 30 capsule 2  . meclizine (ANTIVERT) 25 MG tablet Take 25 mg by mouth 3 (three) times daily as needed for dizziness (take 1/2 to 1 tablet by mouth as needed for dizziness).    . Olopatadine HCl (PATADAY) 0.2 % SOLN Place 1 drop into both eyes 2 (two) times daily.     . pantoprazole (PROTONIX) 40 MG tablet Take 1 tablet (40 mg total) by mouth daily at 6 (six) AM. 30 tablet 2  . potassium chloride SA (K-DUR,KLOR-CON) 20 MEQ tablet Take 1 tablet (20 mEq total) by mouth daily. 30 tablet 2  . diclofenac sodium (VOLTAREN) 1 % GEL Apply 2 g topically 2 (two) times daily as needed (pain).     . ondansetron (ZOFRAN) 4 MG tablet Take 1 tablet (4 mg total) by mouth every 8 (eight) hours as needed for nausea or vomiting. (Patient not taking: Reported on 12/10/2014) 30 tablet 1   No current facility-administered medications for this visit.     Past Medical History  Diagnosis Date  . Long Q-T syndrome   . Palpitation   . Seizure disorder     none recently  . Abdominal pain, periumbilic   . Esophageal stricture 2005/2009    esophageal strictures dilated 2005, 2009  . Sarcoidosis   . Allergic rhinitis   . Bronchitis   . GERD (gastroesophageal reflux disease) 2005  . Asthma   . Fibromyalgia   . Stroke     QUESTIONABLE TIA   . Neuropathy in diabetes   . Seizures     HISTORY OF TAKING KLONIPIN NOT RX AT THE MOMENT  . Cancer     Nodules in lungs pt believes were cancerous pt unsure  . Arthritis   . Internal hemorrhoids 2010    2011 band ligation of int rrhoids.  . Eye hemorrhage     bilateral  . Fibroid, uterine   . Automatic implantable cardioverter-defibrillator in  situ 2006, replaced 2008    [employer]TRON[employer]  . DM (diabetes mellitus) 1997/1998    type 1, initially gestational but soon after child birth developed IDDM  . History of blood transfusion years ago    ROS:   All systems reviewed and negative except as noted in the HPI.   Past Surgical History  Procedure Laterality Date  . Tubal ligation    . Hand surgery    . Svt ablation    . Automatic implantable cardiac defibrillator situ  2006,     ICD-Medtronic   Remote - Yes   . Hemorrhoid banding  03/2010  . Dilitation & currettage/hystroscopy with hydrothermal ablation N/A 06/07/2013    Procedure: DILATATION & CURETTAGE/HYSTEROSCOPY WITH attempted HYDROTHERMAL ABLATION;  Surgeon: Frederico Hamman, MD;  Location: [facility];  Service: Gynecology;  Laterality: N/A;  . Esophagogastroduodenoscopy (egd) with propofol N/A 10/24/2014    Procedure: ESOPHAGOGASTRODUODENOSCOPY (EGD) WITH PROPOFOL;  Surgeon: Inda Castle, MD;  Location: [facility];  Service: Endoscopy;  Laterality: N/A;  . Total abdominal hysterectomy w/ bilateral salpingoophorectomy  11/06/14    at [facility]. for menorrhagia, pelvic pain and enlarging uterine fibroids  . Exploratory laparotomy  11/06/14    evacuation of post TAH/BSO hematoma     Family History  Problem Relation Age of Onset  . Cancer    . Diabetes       History   Social History  . Marital Status: Single    Spouse Name: N/A  . Number of Children: N/A  . Years of Education: N/A   Occupational History  . Not on file.   Social History Main Topics  . Smoking status: Never Smoker   . Smokeless tobacco: Never Used  . Alcohol Use: Yes     Comment: occ.  . Drug Use: No  . Sexual Activity: Not on file   Other Topics Concern  . Not on file   Social History Narrative     BP 98/58 mmHg  Pulse 78  Ht 5\' 9"  (1.753 m)  Wt 130 lb (58.968 kg)  BMI 19.19 kg/m2  SpO2 97%  Physical Exam:  Chronically ill appearing 44 year old woman, NAD HEENT: Unremarkable Neck:   6 cm JVD, no thyromegally Back:  No CVA tenderness Lungs:  Clear with no wheezes, rales, or rhonchi. HEART:  Regular rate rhythm, no murmurs, no rubs, no clicks Abd:  soft, positive bowel sounds, no organomegally, no rebound, no guarding Ext:  2 plus pulses, no edema, no cyanosis, no clubbing Skin:  No rashes no nodules Neuro:  CN II through XII intact, motor grossly  intact  DEVICE  Normal device function.  See PaceArt for details. Approaching ERI.  Assess/Plan:

## 2014-12-10 NOTE — Assessment & Plan Note (Signed)
Her dyspnea is of unclear etiology. She has normal LV function. I will ask her to try lasix 20 mg daily. She will return for labs next week. If she is no better after 3-4 days then she will stop her lasix.

## 2014-12-10 NOTE — Patient Instructions (Addendum)
Medication Instructions:  Your physician has recommended you make the following change in your medication:  1) START Lasix 20 mg by mouth daily (STOP taking after 3 days if you do not notice a difference in your breathing)   Labwork: Your physician recommends that you return for lab work on 12/17/2014 (come any time to our office between 7:30am - 5pm) (BMET)   Testing/Procedures: NONE  Follow-Up: Your physician recommends that you schedule a follow-up appointment in: 3 months 10/27 @ 9:15 am.   Any Other Special Instructions Will Be Listed Below (If Applicable).

## 2014-12-10 NOTE — Assessment & Plan Note (Signed)
She will continue her beta blocker. No change in meds.

## 2014-12-11 ENCOUNTER — Encounter: Payer: Self-pay | Admitting: Gastroenterology

## 2014-12-11 ENCOUNTER — Ambulatory Visit (INDEPENDENT_AMBULATORY_CARE_PROVIDER_SITE_OTHER): Payer: Medicaid Other | Admitting: Gastroenterology

## 2014-12-11 VITALS — BP 110/60 | HR 100 | Ht 67.0 in | Wt 131.0 lb

## 2014-12-11 DIAGNOSIS — R748 Abnormal levels of other serum enzymes: Secondary | ICD-10-CM

## 2014-12-11 DIAGNOSIS — R110 Nausea: Secondary | ICD-10-CM

## 2014-12-11 MED ORDER — LINACLOTIDE 145 MCG PO CAPS: 145.0000 ug | ORAL_CAPSULE | Freq: Every day | ORAL | Status: DC

## 2014-12-11 NOTE — Patient Instructions (Addendum)
Your physician has requested that you go to the basement for lab work on Monday.  Stop taking Amitiza and start taking Linzess.   Start Gastroparesis diet.

## 2014-12-11 NOTE — Progress Notes (Signed)
     12/11/2014 [name] [id number] 05-16-1971   History of Present Illness:  Patient is a 44 year old female who is known to Dr. Deatra Ina.  She has multiple chronic medical problems.  Was recently hospitalized where she was seen in consult by our group for complaints of nausea and vomiting.  EGD, CT scan, ultrasound, GES unremarkable.  Was also noted to have an elevated ALP (597 at discharge).  She is here today for hospital follow-up.  Still with nausea, maybe somewhat improved.  She is on pantoprazole 40 mg daily.  Ate dinner well last night.  ALP was repeated 5 days ago and was increased further to 1047.  AST and ALT are normal.  Total bili slightly elevated at 1.8.    Current Medications, Allergies, Past Medical History, Past Surgical History, Family History and Social History were reviewed in [employer] record.   Physical Exam: BP 110/60 mmHg  Pulse 100  Ht 5\' 7"  (1.702 m)  Wt 131 lb (59.421 kg)  BMI 20.51 kg/m2 General: Thin black female in no acute distress; in wheelchair Head: Normocephalic and atraumatic Eyes:  Sclerae anicteric, conjunctiva pink  Ears: Normal auditory acuity Lungs: Clear throughout to auscultation Heart: Regular rate and rhythm Abdomen: Soft, non-distended.  Normal bowel sounds.   Musculoskeletal: Symmetrical with no gross deformities  Extremities: No edema  Neurological: Alert oriented x 4, grossly non-focal Psychological:  Alert and cooperative. Normal mood and affect  Assessment and Recommendations: -Nausea, prolonged/chronic:  Recent EGD, CT scan, ultrasound, GES unremarkable.  ? If it is related to her medication/side effects.  Nausea common side effect of Amitiza.  Will discontinue Amitiza and start Linzess 145 mcg daily (says that she took Linzess before and it seemed to work so not sure why it was switched, ? insurance issue).   -Chronic constipation:  Discontinue Amitiza and start Linzess 145 mcg daily as stated above.    -Elevated ALP:  Increased significantly since hospitalization.  Transaminases normal.  Total bili slightly elevated.  Will check GGT and 5' nucleotidase to see if this is coming from liver. -Status post TAH/BSO 6/21 for dysfunctional uterine bleeding and enlarged fibroids. Second surgery same day, ex-lap with evacuation of hematoma. -Torsades, leading to firing of her ICD recently  -Long-standing history of type I diabetes mellitus -History sarcoidosis. Latest chest CT was 05/2006 showing stable, benign bilateral pulmonary nodules.   *Follow-up with Dr. Deatra Ina.

## 2014-12-12 NOTE — Progress Notes (Signed)
Reviewed and agree with management.  If 5'-nucleotidase or gamma GTPase is elevated would repeat imaging of the liver by ultrasound Herbie Baltimore D. Deatra Ina, M.D., [facility]

## 2014-12-13 ENCOUNTER — Telehealth: Payer: Self-pay | Admitting: Internal Medicine

## 2014-12-13 NOTE — Telephone Encounter (Signed)
Spoke with patient and let her know I had not called her in regards to test results

## 2014-12-13 NOTE — Telephone Encounter (Signed)
New message    Pt returning call regarding test results Please call to discuss

## 2014-12-17 ENCOUNTER — Other Ambulatory Visit: Payer: Self-pay | Admitting: *Deleted

## 2014-12-17 ENCOUNTER — Other Ambulatory Visit (INDEPENDENT_AMBULATORY_CARE_PROVIDER_SITE_OTHER): Payer: Medicaid Other | Admitting: *Deleted

## 2014-12-17 DIAGNOSIS — R110 Nausea: Secondary | ICD-10-CM

## 2014-12-17 DIAGNOSIS — R0602 Shortness of breath: Secondary | ICD-10-CM | POA: Diagnosis not present

## 2014-12-17 DIAGNOSIS — R748 Abnormal levels of other serum enzymes: Secondary | ICD-10-CM

## 2014-12-17 LAB — BASIC METABOLIC PANEL
BUN: 6 mg/dL (ref 6–23)
CALCIUM: 9.5 mg/dL (ref 8.4–10.5)
CO2: 27 mEq/L (ref 19–32)
Chloride: 91 mEq/L — ABNORMAL LOW (ref 96–112)
Creatinine, Ser: 0.4 mg/dL (ref 0.40–1.20)
GFR: 223.86 mL/min (ref >60.00)
GLUCOSE: 167 mg/dL — AB (ref 70–99)
POTASSIUM: 3.6 meq/L (ref 3.5–5.1)
Sodium: 130 mEq/L — ABNORMAL LOW (ref 135–145)

## 2014-12-21 ENCOUNTER — Ambulatory Visit: Payer: Medicaid Other | Admitting: Family Medicine

## 2014-12-24 ENCOUNTER — Encounter: Payer: Self-pay | Admitting: Family Medicine

## 2014-12-24 ENCOUNTER — Ambulatory Visit (INDEPENDENT_AMBULATORY_CARE_PROVIDER_SITE_OTHER): Payer: Medicaid Other | Admitting: Family Medicine

## 2014-12-24 VITALS — BP 133/77 | HR 114 | Temp 100.8°F | Ht 67.0 in | Wt 132.4 lb

## 2014-12-24 DIAGNOSIS — R509 Fever, unspecified: Secondary | ICD-10-CM | POA: Diagnosis not present

## 2014-12-24 DIAGNOSIS — Z13220 Encounter for screening for lipoid disorders: Secondary | ICD-10-CM | POA: Diagnosis not present

## 2014-12-24 DIAGNOSIS — R5382 Chronic fatigue, unspecified: Secondary | ICD-10-CM

## 2014-12-24 DIAGNOSIS — E119 Type 2 diabetes mellitus without complications: Secondary | ICD-10-CM | POA: Diagnosis not present

## 2014-12-24 LAB — POCT GLYCOSYLATED HEMOGLOBIN (HGB A1C): Hemoglobin A1C: 6.3

## 2014-12-24 MED ORDER — CLONAZEPAM 0.5 MG PO TABS: 0.5000 mg | ORAL_TABLET | Freq: Every day | ORAL | Status: DC

## 2014-12-24 NOTE — Progress Notes (Signed)
Subjective    [name] is a 44 y.o. female that presents to establish care.   HPI:  1. Fatigue: Symptoms started about 8 months ago. She has decreased interest in things she used to like. Symptoms are constant. Her sleep has decreased and currently sleeps about an hour or so a few times a day. She has intermittent fevers (up to 103 degrees farenheit), hot flashes and night sweats. No known TB exposure. No recent travel outside the country. No history of HIV. She states her last HIV check was negative last year. She is not currently sexually active.  2. Weight loss: Followed by Dr. Deatra Ina. She has early satiety and nausea/vomiting. She is able keep down milk. She is following a special diet which she is not able to tolerate very well.  Past Medical History  Diagnosis Date  . Long Q-T syndrome   . Palpitation   . Seizure disorder     none recently  . Abdominal pain, periumbilic   . Esophageal stricture 2005/2009    esophageal strictures dilated 2005, 2009  . Sarcoidosis   . Allergic rhinitis   . Bronchitis   . GERD (gastroesophageal reflux disease) 2005  . Asthma   . Fibromyalgia   . Stroke     QUESTIONABLE TIA   . Neuropathy in diabetes   . Seizures     HISTORY OF TAKING KLONIPIN NOT RX AT THE MOMENT  . Cancer     Nodules in lungs pt believes were cancerous pt unsure  . Arthritis   . Internal hemorrhoids 2010    2011 band ligation of int rrhoids.  . Eye hemorrhage     bilateral  . Fibroid, uterine   . Automatic implantable cardioverter-defibrillator in situ 2006, replaced 2008    [employer]  . DM (diabetes mellitus) 1997/1998    type 1, initially gestational but soon after child birth developed IDDM  . History of blood transfusion years ago    Past Surgical History  Procedure Laterality Date  . Tubal ligation    . Hand surgery    . Svt ablation    . Automatic implantable cardiac defibrillator situ  2006,     ICD-Medtronic   Remote - Yes   . Hemorrhoid  banding  03/2010  . Dilitation & currettage/hystroscopy with hydrothermal ablation N/A 06/07/2013    Procedure: DILATATION & CURETTAGE/HYSTEROSCOPY WITH attempted HYDROTHERMAL ABLATION;  Surgeon: Frederico Hamman, MD;  Location: [facility];  Service: Gynecology;  Laterality: N/A;  . Esophagogastroduodenoscopy (egd) with propofol N/A 10/24/2014    Procedure: ESOPHAGOGASTRODUODENOSCOPY (EGD) WITH PROPOFOL;  Surgeon: Inda Castle, MD;  Location: [facility];  Service: Endoscopy;  Laterality: N/A;  . Total abdominal hysterectomy w/ bilateral salpingoophorectomy  11/06/14    at [facility]. for menorrhagia, pelvic pain and enlarging uterine fibroids  . Exploratory laparotomy  11/06/14    evacuation of post TAH/BSO hematoma    Current Outpatient Prescriptions on File Prior to Visit  Medication Sig Dispense Refill  . atenolol (TENORMIN) 50 MG tablet Take 1 tablet (50 mg total) by mouth 2 (two) times daily. 60 tablet 1  . beclomethasone (QVAR) 80 MCG/ACT inhaler Inhale 2 puffs into the lungs 2 (two) times daily. 1 Inhaler 5  . clonazePAM (KLONOPIN) 0.5 MG tablet Take 0.5 mg by mouth daily.  0  . cyclobenzaprine (FLEXERIL) 10 MG tablet Take 10 mg by mouth 3 (three) times daily as needed for muscle spasms.     . cycloSPORINE (RESTASIS) 0.05 %  ophthalmic emulsion Place 1 drop into both eyes 2 (two) times daily.     . fluticasone (FLONASE) 50 MCG/ACT nasal spray Place 2 sprays into both nostrils daily.    . furosemide (LASIX) 20 MG tablet Take 1 tablet (20 mg total) by mouth daily. 30 tablet 11  . gabapentin (NEURONTIN) 100 MG capsule Take 1 capsule (100 mg total) by mouth 3 (three) times daily. 90 capsule 2  . glimepiride (AMARYL) 2 MG tablet Take 2 mg by mouth 2 (two) times daily at 8 am and 10 pm.     . insulin aspart (NOVOLOG) 100 UNIT/ML injection Inject 4-8 Units into the skin 3 (three) times daily before meals. Sliding scale    . insulin glargine (LANTUS) 100 UNIT/ML injection Inject 35 Units into the skin  at bedtime.     . Linaclotide (LINZESS) 145 MCG CAPS capsule Take 1 capsule (145 mcg total) by mouth daily. 30 capsule 3  . meclizine (ANTIVERT) 25 MG tablet Take 25 mg by mouth 3 (three) times daily as needed for dizziness (take 1/2 to 1 tablet by mouth as needed for dizziness).    . Olopatadine HCl (PATADAY) 0.2 % SOLN Place 1 drop into both eyes 2 (two) times daily.     . ondansetron (ZOFRAN) 4 MG tablet Take 1 tablet (4 mg total) by mouth every 8 (eight) hours as needed for nausea or vomiting. 30 tablet 1  . pantoprazole (PROTONIX) 40 MG tablet Take 1 tablet (40 mg total) by mouth daily at 6 (six) AM. 30 tablet 2  . potassium chloride SA (K-DUR,KLOR-CON) 20 MEQ tablet Take 1 tablet (20 mEq total) by mouth daily. 30 tablet 2   No current facility-administered medications on file prior to visit.    Allergies  Allergen Reactions  . Contrast Media [Iodinated Diagnostic Agents] Anaphylaxis and Shortness Of Breath    Needed to be defibrillated   . Augmentin [Amoxicillin-Pot Clavulanate] Other (See Comments)    "stomach hurt"  . Codeine Nausea And Vomiting    jittery  . Iohexol      Code: RASH, Desc: PT STATES SHE IS ALLERGIC TO IV CONTRAST 05/28/06/RM, Onset Date: 56256389   . Meperidine Hcl Nausea And Vomiting  . Morphine And Related Nausea And Vomiting    History   Social History  . Marital Status: Single    Spouse Name: N/A  . Number of Children: N/A  . Years of Education: N/A   Social History Main Topics  . Smoking status: Never Smoker   . Smokeless tobacco: Never Used  . Alcohol Use: Yes     Comment: occ.  . Drug Use: No  . Sexual Activity: Not on file   Other Topics Concern  . Not on file   Social History Narrative    Family History  Problem Relation Age of Onset  . Cancer    . Diabetes      ROS  Per HPI   Objective   BP 133/77 mmHg  Pulse 114  Temp(Src) 100.8 F (38.2 C) (Oral)  Ht 5\' 7"  (1.702 m)  Wt 132 lb 6.4 oz (60.056 kg)  BMI 20.73  kg/m2  General: Frail appearing, no distress HEENT: TMs clear, oropharynx clear Respiratory/Chest: Clear to auscultation bilaterally Cardiovascular: 2/6 systolic murmur, regular rate and rhythm Gastrointestinal: Soft, mild diffuse tenderness without rebound or guarding Genitourinary: Not examined    Musculoskeletal: Thin, low muscle bulk Neuro: Alert, oriented Psychiatric: Flat affect  No orders of the defined types were placed  in this encounter.    Assessment and Plan    Please refer to problem based charting of assessment and plan

## 2014-12-24 NOTE — Patient Instructions (Signed)
Thank you for coming to see me today. It was a pleasure. Today we talked about:   I am getting lab work. You will get results usually by mail but maybe by phone. If no results in 2 weeks, please follow-up  I will refill your Klonopin  I will not start you on depression medications until the next time I see you.  If you have any questions or concerns, please do not hesitate to call the office at [phone].  Sincerely,  Cordelia Poche, MD

## 2014-12-25 LAB — HEPATITIS B SURFACE ANTIGEN: Hepatitis B Surface Ag: NEGATIVE

## 2014-12-25 LAB — LIPID PANEL
Cholesterol: 122 mg/dL — ABNORMAL LOW (ref 125–200)
HDL: 14 mg/dL — AB (ref >=46)
LDL Cholesterol: 61 mg/dL (ref <130)
Total CHOL/HDL Ratio: 8.7 Ratio — ABNORMAL HIGH (ref <=5.0)
Triglycerides: 236 mg/dL — ABNORMAL HIGH (ref <150)
VLDL: 47 mg/dL — ABNORMAL HIGH (ref <30)

## 2014-12-25 LAB — HEPATITIS B SURFACE ANTIBODY,QUALITATIVE: HEP B S AB: NEGATIVE

## 2014-12-25 LAB — HEPATITIS B CORE ANTIBODY, IGM: HEP B C IGM: NONREACTIVE

## 2014-12-25 LAB — HIV ANTIBODY (ROUTINE TESTING W REFLEX): HIV 1&2 Ab, 4th Generation: NONREACTIVE

## 2014-12-25 LAB — HEPATITIS C ANTIBODY: HCV Ab: NEGATIVE

## 2014-12-26 ENCOUNTER — Encounter: Payer: Self-pay | Admitting: Internal Medicine

## 2015-01-01 DIAGNOSIS — R5382 Chronic fatigue, unspecified: Secondary | ICD-10-CM | POA: Insufficient documentation

## 2015-01-01 DIAGNOSIS — R509 Fever, unspecified: Secondary | ICD-10-CM | POA: Insufficient documentation

## 2015-01-01 NOTE — Assessment & Plan Note (Signed)
Patient with long standing fatigue. Has a history of transaminitis and elevated alkaline phosphatase and is followed by GI. Possibly what is contributing to her fatigue.

## 2015-01-02 NOTE — Assessment & Plan Note (Signed)
Continue current medication Repeat A1C

## 2015-01-02 NOTE — Assessment & Plan Note (Signed)
With history of chronic cough, possible TB, although no exposures. HIV in the past has been negative per patient. Overall picture is very difficult. Will check hepatitis B c and s ab, hep c ab and HIV. Will also check PPD.

## 2015-01-07 ENCOUNTER — Ambulatory Visit (INDEPENDENT_AMBULATORY_CARE_PROVIDER_SITE_OTHER): Payer: Medicaid Other | Admitting: Critical Care Medicine

## 2015-01-07 ENCOUNTER — Encounter: Payer: Self-pay | Admitting: Critical Care Medicine

## 2015-01-07 VITALS — BP 114/74 | HR 76 | Temp 98.3°F | Ht 67.0 in | Wt 132.0 lb

## 2015-01-07 DIAGNOSIS — D869 Sarcoidosis, unspecified: Secondary | ICD-10-CM | POA: Diagnosis not present

## 2015-01-07 DIAGNOSIS — J449 Chronic obstructive pulmonary disease, unspecified: Secondary | ICD-10-CM

## 2015-01-07 DIAGNOSIS — R634 Abnormal weight loss: Secondary | ICD-10-CM

## 2015-01-07 NOTE — Progress Notes (Signed)
Subjective:    Patient ID: [name], female    DOB: 11/04/1970, 44 y.o.   MRN: [id number]  HPI 01/07/2015 Chief Complaint  Patient presents with  . 2 month follow up    Since starting furosemide, SOB is still present but has improved.  Cough with clear to thick, green mucus.     At GI visit: Nausea, prolonged/chronic: Recent EGD, CT scan, ultrasound, GES unremarkable. ? If it is related to her medication/side effects. Nausea common side effect of Amitiza. Will discontinue Amitiza and start Linzess 145 mcg daily (says that she took Linzess before and it seemed to work so not sure why it was switched, ? insurance issue).  -Chronic constipation: Discontinue Amitiza and start Linzess 145 mcg daily as stated above.  -Elevated ALP: Increased significantly since hospitalization. Transaminases normal. Total bili slightly elevated. Will check GGT and 5' nucleotidase to see if this is coming from liver. -Status post TAH/BSO 6/21 for dysfunctional uterine bleeding and enlarged fibroids. Second surgery same day, ex-lap with evacuation of hematoma. -Torsades, leading to firing of her ICD recently  -Long-standing history of type I diabetes mellitus -History sarcoidosis. Latest chest CT was 05/2006 showing stable, benign bilateral pulmonary nodules.     Pt denies any significant sore throat, nasal congestion or excess secretions, fever, chills, sweats, unintended weight loss, pleurtic or exertional chest pain, orthopnea PND, or leg swelling Pt denies any increase in rescue therapy over baseline, denies waking up needing it or having any early am or nocturnal exacerbations of coughing/wheezing/or dyspnea. Pt also denies any obvious fluctuation in symptoms with  weather or environmental change or other alleviating or aggravating factors   Current Medications, Allergies, Complete Past Medical History, Past Surgical History, Family History, and Social History were reviewed in  [place] record per todays encounter:  01/07/2015  Review of Systems  Constitutional: Positive for activity change, appetite change, fatigue and unexpected weight change.  HENT: Negative.  Negative for ear pain, postnasal drip, rhinorrhea, sinus pressure, sore throat, trouble swallowing and voice change.   Eyes: Negative.   Respiratory: Positive for cough and shortness of breath. Negative for apnea, choking, chest tightness, wheezing and stridor.   Cardiovascular: Negative.  Negative for chest pain, palpitations and leg swelling.  Gastrointestinal: Positive for nausea and vomiting. Negative for abdominal pain and abdominal distention.  Genitourinary: Negative.   Musculoskeletal: Negative.  Negative for myalgias and arthralgias.  Skin: Negative.  Negative for rash.  Allergic/Immunologic: Negative.  Negative for environmental allergies and food allergies.  Neurological: Negative.  Negative for dizziness, syncope, weakness and headaches.  Hematological: Negative.  Negative for adenopathy. Does not bruise/bleed easily.  Psychiatric/Behavioral: Negative.  Negative for sleep disturbance and agitation. The patient is not nervous/anxious.        Objective:   Physical Exam  Filed Vitals:   01/07/15 1015  BP: 114/74  Pulse: 76  Temp: 98.3 F (36.8 C)  TempSrc: Oral  Height: 5\' 7"  (1.702 m)  Weight: 132 lb (59.875 kg)  SpO2: 98%    Gen: ,thin , in no distress, anxious affect  ENT: No lesions,  mouth clear,  oropharynx clear, no postnasal drip  Neck: No JVD, no TMG, no carotid bruits  Lungs: No use of accessory muscles, no dullness to percussion, clear without rales or rhonchi  Cardiovascular: RRR, heart sounds normal, no murmur or gallops, no peripheral edema  Abdomen: soft and NT, no HSM,  BS normal  Musculoskeletal: No deformities, no cyanosis or clubbing  Neuro: alert, non focal  Skin: Warm, no lesions or rashes  No results found.        Assessment & Plan:  I personally reviewed all images and lab data in the [facility] system as well as any outside material available during this office visit and agree with the  radiology impressions.   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  Diagnoses and all orders for this visit:  OBSTRUCTIVE CHRONIC BRONCHITIS  Sarcoidosis  Weight loss    I had an extended discussion with the patient and or family lasting 10 minutes of a 25 minute visit including:  Dx , tx options

## 2015-01-07 NOTE — Patient Instructions (Signed)
No change in medications. Return in        6 months        

## 2015-01-08 NOTE — Assessment & Plan Note (Signed)
Significant weight loss  Essentially negative gastroenterology workup to date  Elevated alkaline phosphatase of unclear etiology suspect some of this could be psycho somatic in nature

## 2015-01-08 NOTE — Assessment & Plan Note (Addendum)
Chronic obstructive airways with asthma    With lower airway inflammation and overlap syndrome  Plan  Maintain Qvar 2 puff twice daily

## 2015-01-08 NOTE — Assessment & Plan Note (Signed)
Pulmonary sarcoidosis stable at this time  Maintain on inhaled steroid and off systemic steroid for now

## 2015-01-11 ENCOUNTER — Telehealth: Payer: Self-pay | Admitting: *Deleted

## 2015-01-11 NOTE — Telephone Encounter (Signed)
-----   Message from Mariel Aloe, MD sent at 01/02/2015 12:04 AM EDT ----- This patient was supposed to have a PPD performed at her previous visit. I will cancel the previous order. I will call her to come for other things, but would like her to obtain a PPD.

## 2015-01-11 NOTE — Telephone Encounter (Signed)
LM for patient to call back.  Please assist her in making a nurse visit for a ppd placement.  Thanks [employer]

## 2015-01-14 ENCOUNTER — Telehealth: Payer: Self-pay | Admitting: Family Medicine

## 2015-01-14 ENCOUNTER — Telehealth: Payer: Self-pay | Admitting: Gastroenterology

## 2015-01-14 NOTE — Telephone Encounter (Signed)
Pt called and she would like to have her test results from 12/24/14. She said no one has called her and she is concerned about the results. jw

## 2015-01-14 NOTE — Telephone Encounter (Signed)
Doc of the Day Patient has worsening indigestion with burping and early satiety. She feels nauseated frequently. This is affecting her glucoses and she is having frequent low readings. She takes her protonix every morning at 6am. She had a normal EGD and GES. She has abnormal LFT and her follow up is at the end of September. Please advise. If you want to change her PPI, I found a hospital note that states she was on Nexium in the past with good results. Can she have something for nausea?

## 2015-01-14 NOTE — Telephone Encounter (Signed)
Called patient to discuss lab results. I had wanted her to return for an office visit to discuss results and continued management from or last encounter. Will be happy to discus results over the phone if that is what she prefers. I will also send a letter to her with her results. I would still like her to follow-up with me. I instructed her to call back with good times to call her back.

## 2015-01-14 NOTE — Telephone Encounter (Signed)
1) Ondansetron 4 mg ODT every 4 hrs prn nausea# 60 no RF 2) Needs repeat hepatic function panel  3) needs to do alkaline phophatase isoenzyme or could be called fractionated 4) also needs to do the GGT and 5 prime nucletidase that were ordered 5) no change inPPi

## 2015-01-15 ENCOUNTER — Other Ambulatory Visit: Payer: Self-pay

## 2015-01-15 ENCOUNTER — Other Ambulatory Visit (INDEPENDENT_AMBULATORY_CARE_PROVIDER_SITE_OTHER): Payer: Medicaid Other

## 2015-01-15 DIAGNOSIS — R945 Abnormal results of liver function studies: Secondary | ICD-10-CM

## 2015-01-15 DIAGNOSIS — R112 Nausea with vomiting, unspecified: Secondary | ICD-10-CM

## 2015-01-15 DIAGNOSIS — K7689 Other specified diseases of liver: Secondary | ICD-10-CM

## 2015-01-15 LAB — HEPATIC FUNCTION PANEL
ALBUMIN: 2.8 g/dL — AB (ref 3.5–5.2)
ALK PHOS: 862 U/L — AB (ref 39–117)
ALT: 11 U/L (ref 0–35)
AST: 23 U/L (ref 0–37)
Bilirubin, Direct: 0.5 mg/dL — ABNORMAL HIGH (ref 0.0–0.3)
TOTAL PROTEIN: 9.6 g/dL — AB (ref 6.0–8.3)
Total Bilirubin: 0.6 mg/dL (ref 0.2–1.2)

## 2015-01-15 LAB — GAMMA GT: GGT: 372 U/L — AB (ref 7–51)

## 2015-01-15 MED ORDER — ONDANSETRON HCL 4 MG PO TABS
4.0000 mg | ORAL_TABLET | Freq: Four times a day (QID) | ORAL | Status: DC | PRN
Start: 2015-01-15 — End: 2015-12-12

## 2015-01-15 NOTE — Telephone Encounter (Signed)
Patient instructed. Agrees to this plan of care. 

## 2015-01-16 ENCOUNTER — Encounter: Payer: Self-pay | Admitting: Family Medicine

## 2015-01-17 ENCOUNTER — Other Ambulatory Visit: Payer: Self-pay

## 2015-01-17 DIAGNOSIS — R748 Abnormal levels of other serum enzymes: Secondary | ICD-10-CM

## 2015-01-17 NOTE — Progress Notes (Signed)
Quick Note:  Please let her know that liver test - alkaline phosphatase still abnormal  Needs mitochondrial Ab's, IgG level, ANA and an antismooth muscle Ab  Dx is abnormal alkaline phosphatase ______

## 2015-01-19 ENCOUNTER — Emergency Department (HOSPITAL_COMMUNITY): Payer: Medicaid Other

## 2015-01-19 ENCOUNTER — Inpatient Hospital Stay (HOSPITAL_COMMUNITY)
Admission: EM | Admit: 2015-01-19 | Discharge: 2015-01-29 | DRG: 197 | Disposition: A | Discharge status: Home Health | Payer: Medicaid Other | Attending: Family Medicine | Admitting: Family Medicine

## 2015-01-19 ENCOUNTER — Encounter (HOSPITAL_COMMUNITY): Payer: Self-pay

## 2015-01-19 DIAGNOSIS — E119 Type 2 diabetes mellitus without complications: Secondary | ICD-10-CM

## 2015-01-19 DIAGNOSIS — R162 Hepatomegaly with splenomegaly, not elsewhere classified: Secondary | ICD-10-CM | POA: Diagnosis present

## 2015-01-19 DIAGNOSIS — Z79899 Other long term (current) drug therapy: Secondary | ICD-10-CM

## 2015-01-19 DIAGNOSIS — E876 Hypokalemia: Secondary | ICD-10-CM | POA: Diagnosis not present

## 2015-01-19 DIAGNOSIS — A419 Sepsis, unspecified organism: Secondary | ICD-10-CM

## 2015-01-19 DIAGNOSIS — R42 Dizziness and giddiness: Secondary | ICD-10-CM | POA: Diagnosis not present

## 2015-01-19 DIAGNOSIS — IMO0001 Reserved for inherently not codable concepts without codable children: Secondary | ICD-10-CM

## 2015-01-19 DIAGNOSIS — I10 Essential (primary) hypertension: Secondary | ICD-10-CM | POA: Diagnosis present

## 2015-01-19 DIAGNOSIS — D862 Sarcoidosis of lung with sarcoidosis of lymph nodes: Secondary | ICD-10-CM | POA: Diagnosis present

## 2015-01-19 DIAGNOSIS — J189 Pneumonia, unspecified organism: Secondary | ICD-10-CM | POA: Diagnosis present

## 2015-01-19 DIAGNOSIS — I152 Hypertension secondary to endocrine disorders: Secondary | ICD-10-CM | POA: Diagnosis present

## 2015-01-19 DIAGNOSIS — I272 Other secondary pulmonary hypertension: Secondary | ICD-10-CM | POA: Diagnosis present

## 2015-01-19 DIAGNOSIS — J449 Chronic obstructive pulmonary disease, unspecified: Secondary | ICD-10-CM | POA: Diagnosis present

## 2015-01-19 DIAGNOSIS — I4581 Long QT syndrome: Secondary | ICD-10-CM | POA: Diagnosis present

## 2015-01-19 DIAGNOSIS — Z885 Allergy status to narcotic agent status: Secondary | ICD-10-CM

## 2015-01-19 DIAGNOSIS — R002 Palpitations: Secondary | ICD-10-CM | POA: Diagnosis present

## 2015-01-19 DIAGNOSIS — R634 Abnormal weight loss: Secondary | ICD-10-CM | POA: Diagnosis present

## 2015-01-19 DIAGNOSIS — E1140 Type 2 diabetes mellitus with diabetic neuropathy, unspecified: Secondary | ICD-10-CM | POA: Diagnosis present

## 2015-01-19 DIAGNOSIS — D8689 Sarcoidosis of other sites: Secondary | ICD-10-CM | POA: Diagnosis present

## 2015-01-19 DIAGNOSIS — Y95 Nosocomial condition: Secondary | ICD-10-CM | POA: Diagnosis present

## 2015-01-19 DIAGNOSIS — D860 Sarcoidosis of lung: Principal | ICD-10-CM | POA: Diagnosis present

## 2015-01-19 DIAGNOSIS — K648 Other hemorrhoids: Secondary | ICD-10-CM | POA: Diagnosis not present

## 2015-01-19 DIAGNOSIS — Z833 Family history of diabetes mellitus: Secondary | ICD-10-CM

## 2015-01-19 DIAGNOSIS — Z8673 Personal history of transient ischemic attack (TIA), and cerebral infarction without residual deficits: Secondary | ICD-10-CM

## 2015-01-19 DIAGNOSIS — J45909 Unspecified asthma, uncomplicated: Secondary | ICD-10-CM | POA: Diagnosis present

## 2015-01-19 DIAGNOSIS — Z794 Long term (current) use of insulin: Secondary | ICD-10-CM

## 2015-01-19 DIAGNOSIS — I472 Ventricular tachycardia: Secondary | ICD-10-CM | POA: Diagnosis not present

## 2015-01-19 DIAGNOSIS — Z91041 Radiographic dye allergy status: Secondary | ICD-10-CM

## 2015-01-19 DIAGNOSIS — Z888 Allergy status to other drugs, medicaments and biological substances status: Secondary | ICD-10-CM

## 2015-01-19 DIAGNOSIS — R748 Abnormal levels of other serum enzymes: Secondary | ICD-10-CM | POA: Diagnosis present

## 2015-01-19 DIAGNOSIS — I471 Supraventricular tachycardia: Secondary | ICD-10-CM | POA: Diagnosis present

## 2015-01-19 DIAGNOSIS — G40909 Epilepsy, unspecified, not intractable, without status epilepticus: Secondary | ICD-10-CM | POA: Diagnosis present

## 2015-01-19 DIAGNOSIS — D869 Sarcoidosis, unspecified: Secondary | ICD-10-CM

## 2015-01-19 DIAGNOSIS — R000 Tachycardia, unspecified: Secondary | ICD-10-CM

## 2015-01-19 DIAGNOSIS — Z881 Allergy status to other antibiotic agents status: Secondary | ICD-10-CM

## 2015-01-19 DIAGNOSIS — M797 Fibromyalgia: Secondary | ICD-10-CM | POA: Diagnosis present

## 2015-01-19 DIAGNOSIS — E872 Acidosis: Secondary | ICD-10-CM | POA: Diagnosis present

## 2015-01-19 DIAGNOSIS — W19XXXA Unspecified fall, initial encounter: Secondary | ICD-10-CM | POA: Diagnosis not present

## 2015-01-19 DIAGNOSIS — Z95810 Presence of automatic (implantable) cardiac defibrillator: Secondary | ICD-10-CM

## 2015-01-19 LAB — COMPREHENSIVE METABOLIC PANEL
ALBUMIN: 1.9 g/dL — AB (ref 3.5–5.0)
ALT: 12 U/L — ABNORMAL LOW (ref 14–54)
ANION GAP: 13 (ref 5–15)
AST: 44 U/L — AB (ref 15–41)
Alkaline Phosphatase: 674 U/L — ABNORMAL HIGH (ref 38–126)
BILIRUBIN TOTAL: 0.4 mg/dL (ref 0.3–1.2)
CHLORIDE: 89 mmol/L — AB (ref 101–111)
CO2: 26 mmol/L (ref 22–32)
Calcium: 8.3 mg/dL — ABNORMAL LOW (ref 8.9–10.3)
Creatinine, Ser: 0.69 mg/dL (ref 0.44–1.00)
GFR calc Af Amer: >60 mL/min (ref >60)
GFR calc non Af Amer: >60 mL/min (ref >60)
GLUCOSE: 158 mg/dL — AB (ref 65–99)
POTASSIUM: 3.2 mmol/L — AB (ref 3.5–5.1)
SODIUM: 128 mmol/L — AB (ref 135–145)
TOTAL PROTEIN: 7.8 g/dL (ref 6.5–8.1)

## 2015-01-19 LAB — URINALYSIS, ROUTINE W REFLEX MICROSCOPIC
BILIRUBIN URINE: NEGATIVE
GLUCOSE, UA: NEGATIVE mg/dL
Hgb urine dipstick: NEGATIVE
Ketones, ur: NEGATIVE mg/dL
Leukocytes, UA: NEGATIVE
NITRITE: NEGATIVE
PH: 6.5 (ref 5.0–8.0)
Protein, ur: NEGATIVE mg/dL
SPECIFIC GRAVITY, URINE: 1.006 (ref 1.005–1.030)
Urobilinogen, UA: 1 mg/dL (ref 0.0–1.0)

## 2015-01-19 LAB — CBC WITH DIFFERENTIAL/PLATELET
BASOS ABS: 0 10*3/uL (ref 0.0–0.1)
Basophils Relative: 0 % (ref 0–1)
EOS PCT: 1 % (ref 0–5)
Eosinophils Absolute: 0.1 10*3/uL (ref 0.0–0.7)
HEMATOCRIT: 34.7 % — AB (ref 36.0–46.0)
Hemoglobin: 11.4 g/dL — ABNORMAL LOW (ref 12.0–15.0)
LYMPHS ABS: 2.2 10*3/uL (ref 0.7–4.0)
LYMPHS PCT: 29 % (ref 12–46)
MCH: 28.2 pg (ref 26.0–34.0)
MCHC: 32.9 g/dL (ref 30.0–36.0)
MCV: 85.9 fL (ref 78.0–100.0)
MONO ABS: 1 10*3/uL (ref 0.1–1.0)
MONOS PCT: 12 % (ref 3–12)
NEUTROS ABS: 4.4 10*3/uL (ref 1.7–7.7)
Neutrophils Relative %: 58 % (ref 43–77)
PLATELETS: 326 10*3/uL (ref 150–400)
RBC: 4.04 MIL/uL (ref 3.87–5.11)
RDW: 13.6 % (ref 11.5–15.5)
WBC: 7.7 10*3/uL (ref 4.0–10.5)

## 2015-01-19 LAB — LIPASE, BLOOD: Lipase: 44 U/L (ref 22–51)

## 2015-01-19 LAB — I-STAT CG4 LACTIC ACID, ED
LACTIC ACID, VENOUS: 5.37 mmol/L — AB (ref 0.5–2.0)
LACTIC ACID, VENOUS: 4.37 mmol/L — AB (ref 0.5–2.0)

## 2015-01-19 LAB — NUCLEOTIDASE, 5', BLOOD: 5-Nucleotidase: 14 U/L — ABNORMAL HIGH (ref <11)

## 2015-01-19 MED ORDER — BARIUM SULFATE 2.1 % PO SUSP
ORAL | Status: AC
  Filled 2015-01-19: qty 2

## 2015-01-19 MED ORDER — PIPERACILLIN-TAZOBACTAM 3.375 G IVPB 30 MIN
3.3750 g | Freq: Once | INTRAVENOUS | Status: AC
  Administered 2015-01-19: 3.375 g via INTRAVENOUS
  Filled 2015-01-19: qty 50

## 2015-01-19 MED ORDER — PIPERACILLIN-TAZOBACTAM 3.375 G IVPB
3.3750 g | Freq: Three times a day (TID) | INTRAVENOUS | Status: DC
  Administered 2015-01-20: 3.375 g via INTRAVENOUS
  Filled 2015-01-19 (×3): qty 50

## 2015-01-19 MED ORDER — SODIUM CHLORIDE 0.9 % IV SOLN
1000.0000 mL | INTRAVENOUS | Status: DC
  Administered 2015-01-19: 1000 mL via INTRAVENOUS

## 2015-01-19 MED ORDER — SODIUM CHLORIDE 0.9 % IV BOLUS (SEPSIS)
1000.0000 mL | INTRAVENOUS | Status: AC
  Administered 2015-01-19 (×2): 1000 mL via INTRAVENOUS

## 2015-01-19 MED ORDER — VANCOMYCIN HCL 500 MG IV SOLR
500.0000 mg | Freq: Three times a day (TID) | INTRAVENOUS | Status: DC
  Administered 2015-01-20: 500 mg via INTRAVENOUS
  Filled 2015-01-19 (×3): qty 500

## 2015-01-19 MED ORDER — VANCOMYCIN HCL IN DEXTROSE 1-5 GM/200ML-% IV SOLN
1000.0000 mg | Freq: Once | INTRAVENOUS | Status: AC
Start: 2015-01-19 — End: 2015-01-19
  Administered 2015-01-19: 1000 mg via INTRAVENOUS
  Filled 2015-01-19: qty 200

## 2015-01-19 NOTE — ED Notes (Signed)
Pt refusing rectal temperature. Raquel Sarna, [place] aware.

## 2015-01-19 NOTE — ED Notes (Signed)
Janeth Rase, PA at bedside.

## 2015-01-19 NOTE — Progress Notes (Signed)
ANTIBIOTIC CONSULT NOTE   Pharmacy Consult for vancomycin and Zosyn Indication: rule out sepsis  Allergies  Allergen Reactions  . Contrast Media [Iodinated Diagnostic Agents] Anaphylaxis and Shortness Of Breath    Needed to be defibrillated   . Augmentin [Amoxicillin-Pot Clavulanate] Other (See Comments)    "stomach hurt"  . Codeine Nausea And Vomiting    jittery  . Iohexol      Code: RASH, Desc: PT STATES SHE IS ALLERGIC TO IV CONTRAST 05/28/06/RM, Onset Date: 41660630   . Meperidine Hcl Nausea And Vomiting  . Morphine And Related Nausea And Vomiting    Patient Measurements: Height: 5\' 7"  (170.2 cm) Weight: 132 lb (59.875 kg) IBW/kg (Calculated) : 61.6   Vital Signs: Temp: 98 F (36.7 C) (09/03 2328) Temp Source: Oral (09/03 2328) BP: 124/79 mmHg (09/03 2245) Pulse Rate: 88 (09/03 2245)   Labs:  Recent Labs  01/19/15 2059  WBC 7.7  HGB 11.4*  PLT 326  CREATININE 0.69   Estimated Creatinine Clearance: 85.7 mL/min (by C-G formula based on Cr of 0.69).  Medical History: Past Medical History  Diagnosis Date  . Long Q-T syndrome   . Palpitation   . Seizure disorder     none recently  . Abdominal pain, periumbilic   . Esophageal stricture 2005/2009    esophageal strictures dilated 2005, 2009  . Sarcoidosis   . Allergic rhinitis   . Bronchitis   . GERD (gastroesophageal reflux disease) 2005  . Asthma   . Fibromyalgia   . Stroke     QUESTIONABLE TIA   . Neuropathy in diabetes   . Seizures     HISTORY OF TAKING KLONIPIN NOT RX AT THE MOMENT  . Cancer     Nodules in lungs pt believes were cancerous pt unsure  . Arthritis   . Internal hemorrhoids 2010    2011 band ligation of int rrhoids.  . Eye hemorrhage     bilateral  . Fibroid, uterine   . Automatic implantable cardioverter-defibrillator in situ 2006, replaced 2008    [employer]  . DM (diabetes mellitus) 1997/1998    type 1, initially gestational but soon after child birth developed IDDM  .  History of blood transfusion years ago    Assessment: Broad spectrum anti-biotics for r/o sepsis. WBC WNL. Renal function good.   Goal of Therapy:  Vancomycin trough level 15-20 mcg/ml  Plan:   -Vancomycin 500 mg IV q8h -Zosyn 3.375G IV q8h to be infused over 4 hours -Trend WBC, temp, renal function  -Drug levels as indicated

## 2015-01-19 NOTE — Progress Notes (Signed)
ANTIBIOTIC CONSULT NOTE - INITIAL  Pharmacy Consult for vancomycin and Zosyn Indication: rule out sepsis  Allergies  Allergen Reactions  . Contrast Media [Iodinated Diagnostic Agents] Anaphylaxis and Shortness Of Breath    Needed to be defibrillated   . Augmentin [Amoxicillin-Pot Clavulanate] Other (See Comments)    "stomach hurt"  . Codeine Nausea And Vomiting    jittery  . Iohexol      Code: RASH, Desc: PT STATES SHE IS ALLERGIC TO IV CONTRAST 05/28/06/RM, Onset Date: [id number]   . Meperidine Hcl Nausea And Vomiting  . Morphine And Related Nausea And Vomiting    Patient Measurements: Height: 5\' 7"  (170.2 cm) Weight: 132 lb (59.875 kg) IBW/kg (Calculated) : 61.6   Vital Signs: Temp: 100.7 F (38.2 C) (09/03 2024) Temp Source: Oral (09/03 2024) BP: 131/76 mmHg (09/03 2024) Pulse Rate: 110 (09/03 2024) Intake/Output from previous day:   Intake/Output from this shift:    Labs: No results for input(s): WBC, HGB, PLT, LABCREA, CREATININE in the last 72 hours. CrCl cannot be calculated (Patient has no serum creatinine result on file.). No results for input(s): VANCOTROUGH, VANCOPEAK, VANCORANDOM, GENTTROUGH, GENTPEAK, GENTRANDOM, TOBRATROUGH, TOBRAPEAK, TOBRARND, AMIKACINPEAK, AMIKACINTROU, AMIKACIN in the last 72 hours.   Microbiology: No results found for this or any previous visit (from the past 720 hour(s)).  Medical History: Past Medical History  Diagnosis Date  . Long Q-T syndrome   . Palpitation   . Seizure disorder     none recently  . Abdominal pain, periumbilic   . Esophageal stricture 2005/2009    esophageal strictures dilated 2005, 2009  . Sarcoidosis   . Allergic rhinitis   . Bronchitis   . GERD (gastroesophageal reflux disease) 2005  . Asthma   . Fibromyalgia   . Stroke     QUESTIONABLE TIA   . Neuropathy in diabetes   . Seizures     HISTORY OF TAKING KLONIPIN NOT RX AT THE MOMENT  . Cancer     Nodules in lungs pt believes were cancerous pt  unsure  . Arthritis   . Internal hemorrhoids 2010    2011 band ligation of int rrhoids.  . Eye hemorrhage     bilateral  . Fibroid, uterine   . Automatic implantable cardioverter-defibrillator in situ 2006, replaced 2008    [employer]  . DM (diabetes mellitus) 1997/1998    type 1, initially gestational but soon after child birth developed IDDM  . History of blood transfusion years ago    Assessment: 3 YOF who has a defibrillator and presented with heart palpitations lasting 30 minutes but did not have a shock.  1 time doses of vancomycin and Zosyn have been ordered in the ED. Last SCr was 0.4 on 12/17/2014. A CMET has been ordered along with blood cultures and urine cultures.  Goal of Therapy:  Vancomycin trough level 15-20 mcg/ml  Plan:  -will await current labs to ensure she does not have an AKI -will enter doses based on current labs  Lizabeth Fellner D. Merlin Ege, PharmD, BCPS Clinical Pharmacist Pager: [phone] 01/19/2015 8:53 PM

## 2015-01-19 NOTE — ED Notes (Signed)
Called pharmacy, zosyn and vanc scheduled next dose for in the morning after speaking to [facility], pharmacist.

## 2015-01-19 NOTE — ED Provider Notes (Signed)
CSN: [id number]     Arrival date & time 01/19/15  2018 History   First MD Initiated Contact with Patient 01/19/15 2019     Chief Complaint  Patient presents with  . Palpitations     (Consider location/radiation/quality/duration/timing/severity/associated sxs/prior Treatment) The history is provided by the patient.     Pt with hx long QT, seizures, DM, stroke, sarcoidosis p/w 3 days generalized weakness, fever today to 102, episode of heart racing that felt like prior episode that required defibrillator shock.  The heart racing and pounding lasted approximately 30 minutes before she asked her daughter to call 911.  Has many chronic problems with few recent changes.  Has chronic cough and thinks this may be increased recently.  Has chronic nausea, unchanged.  Chronic mild SOB, unchanged.  Does have dark urine without   Past Medical History  Diagnosis Date  . Long Q-T syndrome   . Palpitation   . Seizure disorder     none recently  . Abdominal pain, periumbilic   . Esophageal stricture 2005/2009    esophageal strictures dilated 2005, 2009  . Sarcoidosis   . Allergic rhinitis   . Bronchitis   . GERD (gastroesophageal reflux disease) 2005  . Asthma   . Fibromyalgia   . Stroke     QUESTIONABLE TIA   . Neuropathy in diabetes   . Seizures     HISTORY OF TAKING KLONIPIN NOT RX AT THE MOMENT  . Cancer     Nodules in lungs pt believes were cancerous pt unsure  . Arthritis   . Internal hemorrhoids 2010    2011 band ligation of int rrhoids.  . Eye hemorrhage     bilateral  . Fibroid, uterine   . Automatic implantable cardioverter-defibrillator in situ 2006, replaced 2008    [employer]  . DM (diabetes mellitus) 1997/1998    type 1, initially gestational but soon after child birth developed IDDM  . History of blood transfusion years ago   Past Surgical History  Procedure Laterality Date  . Tubal ligation    . Hand surgery    . Svt ablation    . Automatic implantable cardiac  defibrillator situ  2006,     ICD-Medtronic   Remote - Yes   . Hemorrhoid banding  03/2010  . Dilitation & currettage/hystroscopy with hydrothermal ablation N/A 06/07/2013    Procedure: DILATATION & CURETTAGE/HYSTEROSCOPY WITH attempted HYDROTHERMAL ABLATION;  Surgeon: Frederico Hamman, MD;  Location: [facility];  Service: Gynecology;  Laterality: N/A;  . Esophagogastroduodenoscopy (egd) with propofol N/A 10/24/2014    Procedure: ESOPHAGOGASTRODUODENOSCOPY (EGD) WITH PROPOFOL;  Surgeon: Inda Castle, MD;  Location: [facility];  Service: Endoscopy;  Laterality: N/A;  . Total abdominal hysterectomy w/ bilateral salpingoophorectomy  11/06/14    at [facility] for menorrhagia, pelvic pain and enlarging uterine fibroids  . Exploratory laparotomy  11/06/14    evacuation of post TAH/BSO hematoma  . Abdominal hysterectomy    . Salpingectomy    . Cervix removal     Family History  Problem Relation Age of Onset  . Cancer    . Diabetes     Social History  Substance Use Topics  . Smoking status: Never Smoker   . Smokeless tobacco: Never Used  . Alcohol Use: No     Comment: occ.   OB History    No data available     Review of Systems  All other systems reviewed and are negative.     Allergies  Contrast media;  Augmentin; Codeine; Iohexol; Meperidine hcl; and Morphine and related  Home Medications   Prior to Admission medications   Medication Sig Start Date End Date Taking? Authorizing Provider  acetaminophen (TYLENOL) 500 MG tablet Take 1,000 mg by mouth every 6 (six) hours as needed for mild pain or fever.   Yes Historical Provider, MD  atenolol (TENORMIN) 50 MG tablet Take 1 tablet (50 mg total) by mouth 2 (two) times daily. 11/26/14  Yes Evans Lance, MD  beclomethasone (QVAR) 80 MCG/ACT inhaler Inhale 2 puffs into the lungs 2 (two) times daily. 09/05/14  Yes Elsie Stain, MD  clonazePAM (KLONOPIN) 0.5 MG tablet Take 1 tablet (0.5 mg total) by mouth daily. 12/24/14  Yes Mariel Aloe,  MD  cyclobenzaprine (FLEXERIL) 10 MG tablet Take 10 mg by mouth 3 (three) times daily as needed for muscle spasms.    Yes Historical Provider, MD  cycloSPORINE (RESTASIS) 0.05 % ophthalmic emulsion Place 1 drop into both eyes 2 (two) times daily.    Yes Historical Provider, MD  fluticasone (FLONASE) 50 MCG/ACT nasal spray Place 2 sprays into both nostrils daily as needed for allergies.    Yes Historical Provider, MD  furosemide (LASIX) 20 MG tablet Take 1 tablet (20 mg total) by mouth daily. 12/10/14  Yes Evans Lance, MD  gabapentin (NEURONTIN) 100 MG capsule Take 1 capsule (100 mg total) by mouth 3 (three) times daily. 06/29/14  Yes Tanda Rockers, MD  glimepiride (AMARYL) 2 MG tablet Take 2 mg by mouth 2 (two) times daily at 8 am and 10 pm.    Yes Historical Provider, MD  LANTUS SOLOSTAR 100 UNIT/ML Solostar Pen Inject 35 Units into the skin at bedtime.   Yes Historical Provider, MD  Linaclotide Rolan Lipa) 145 MCG CAPS capsule Take 1 capsule (145 mcg total) by mouth daily. 12/11/14  Yes Jessica D Zehr, PA-C  meclizine (ANTIVERT) 25 MG tablet Take 25 mg by mouth 3 (three) times daily as needed for dizziness (take 1/2 to 1 tablet by mouth as needed for dizziness).   Yes Historical Provider, MD  NOVOLOG FLEXPEN 100 UNIT/ML FlexPen Inject 4-8 Units into the skin 3 (three) times daily.   Yes Historical Provider, MD  Olopatadine HCl (PATADAY) 0.2 % SOLN Place 1 drop into both eyes 2 (two) times daily.    Yes Historical Provider, MD  ondansetron (ZOFRAN) 4 MG tablet Take 1 tablet (4 mg total) by mouth every 6 (six) hours as needed for nausea or vomiting. 01/15/15  Yes Gatha Mayer, MD  pantoprazole (PROTONIX) 40 MG tablet Take 1 tablet (40 mg total) by mouth daily at 6 (six) AM. 11/24/14  Yes Rhonda G Barrett, PA-C  potassium chloride SA (K-DUR,KLOR-CON) 20 MEQ tablet Take 1 tablet (20 mEq total) by mouth daily. 11/24/14  Yes Rhonda G Barrett, PA-C   BP 125/76 mmHg  Pulse 102  Temp(Src) 100.7 F (38.2 C)  (Oral)  Resp 22  Ht 5\' 7"  (1.702 m)  Wt 132 lb (59.875 kg)  BMI 20.67 kg/m2  SpO2 96% Physical Exam  Constitutional: She appears well-developed and well-nourished. No distress.  HENT:  Head: Normocephalic and atraumatic.  Eyes: Conjunctivae are normal.  Neck: Normal range of motion. Neck supple.  Cardiovascular: Normal rate, regular rhythm and intact distal pulses.   Pulmonary/Chest: Effort normal and breath sounds normal. No respiratory distress. She has no wheezes. She has no rales.  Abdominal: Soft. She exhibits no distension. There is generalized tenderness. There is no rebound  and no guarding.  Musculoskeletal: She exhibits no edema.  Neurological: She is alert. She exhibits normal muscle tone.  Skin: No rash noted. She is not diaphoretic.  Psychiatric: She has a normal mood and affect. Her behavior is normal.  Nursing note and vitals reviewed.   ED Course  Procedures (including critical care time) Labs Review Labs Reviewed  COMPREHENSIVE METABOLIC PANEL - Abnormal; Notable for the following:    Sodium 128 (*)    Potassium 3.2 (*)    Chloride 89 (*)    Glucose, Bld 158 (*)    BUN <5 (*)    Calcium 8.3 (*)    Albumin 1.9 (*)    AST 44 (*)    ALT 12 (*)    Alkaline Phosphatase 674 (*)    All other components within normal limits  CBC WITH DIFFERENTIAL/PLATELET - Abnormal; Notable for the following:    Hemoglobin 11.4 (*)    HCT 34.7 (*)    All other components within normal limits  I-STAT CG4 LACTIC ACID, ED - Abnormal; Notable for the following:    Lactic Acid, Venous 5.37 (*)    All other components within normal limits  I-STAT CG4 LACTIC ACID, ED - Abnormal; Notable for the following:    Lactic Acid, Venous 4.37 (*)    All other components within normal limits  CULTURE, BLOOD (ROUTINE X 2)  CULTURE, BLOOD (ROUTINE X 2)  URINE CULTURE  URINALYSIS, ROUTINE W REFLEX MICROSCOPIC (NOT AT [facility])  LIPASE, BLOOD    Imaging Review Ct Abdomen Pelvis Wo  Contrast  01/20/2015   CLINICAL DATA:  Acute onset of epigastric abdominal pain and swelling. Initial encounter.  EXAM: CT ABDOMEN AND PELVIS WITHOUT CONTRAST  TECHNIQUE: Multidetector CT imaging of the abdomen and pelvis was performed following the standard protocol without IV contrast.  COMPARISON:  CT of the abdomen and pelvis performed 10/25/2014, and abdominal ultrasound performed 10/08/2014  FINDINGS: Diffuse tiny nodular opacities within the visualized right lower lobe and right middle lobe raise suspicion for atypical infection. An AICD lead is partially imaged.  Splenomegaly and hepatomegaly are again noted. The spleen measures 14.3 cm in length, while the liver measures 27.9 cm in length. A slightly nodular contour to the liver raises concern for mild hepatic cirrhosis. 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 The vasculature is not well assessed without contrast.  The gallbladder is relatively decompressed and within normal limits. The pancreas and adrenal glands are unremarkable.  The kidneys are unremarkable in appearance. There is no evidence of hydronephrosis. No renal or ureteral stones are seen. No perinephric stranding is appreciated.  No free fluid is identified. The small bowel is unremarkable in appearance. The stomach is within normal limits. No acute vascular abnormalities are seen.  The appendix is normal in caliber and contains air without evidence of appendicitis. The colon is unremarkable in appearance.  The bladder is moderately distended and grossly unremarkable. The patient is status post hysterectomy. No suspicious adnexal masses are seen. No inguinal lymphadenopathy is seen.  No acute osseous abnormalities are identified.  IMPRESSION: 1. Diffuse tiny nodular opacities within the right lower lung lobe and right middle lung lobe raise suspicion for atypical pneumonia. This is new from the prior study. 2. Splenomegaly and hepatomegaly again noted. Slightly nodular contour to the liver  raises concern for mild hepatic cirrhosis. Apparent gastric varices suggested, not well seen without contrast.   Electronically Signed   By: Garald Balding M.D.   On: 01/20/2015 00:12  Dg Chest 2 View  01/19/2015   CLINICAL DATA:  Cough and fever  EXAM: CHEST  2 VIEW  COMPARISON:  06/29/2014  FINDINGS: AICD remains in good position. Heart size normal. Negative for heart failure. Lungs are clear without infiltrate or effusion.  IMPRESSION: No active cardiopulmonary disease.   Electronically Signed   By: Franchot Gallo M.D.   On: 01/19/2015 21:44   I have personally reviewed and evaluated these images and lab results as part of my medical decision-making.   EKG Interpretation None       ED ECG REPORT   Date: 01/20/2015  Rate: 110  Rhythm: sinus tachycardia  QRS Axis: normal  Intervals: QT prolonged  ST/T Wave abnormalities: nonspecific T wave changes  Conduction Disutrbances:left anterior fascicular block  Narrative Interpretation:   Old EKG Reviewed: none available  I have personally reviewed the EKG tracing and agree with the computerized printout as noted.      12:42 AM Admitted to [place].    MDM   Final diagnoses:  Sepsis, due to unspecified organism  HCAP (healthcare-associated pneumonia)    Febrile patient with 3 days of weakness, 1 day of fever, hours of palpitations.  Palpitations likely sinus tachycardia from sepsis.  Febrile on arrival.  Sepsis order set initiated, vanc/zosyn ordered as source not known initially.  Lactic acid initially 5.3, improved to 4.3.  CT abd/pelvis showed right sided pneumonia.  Pt hospitalized within the past three months, therefore this is HCAP.  Admitted to patient's PCP group [place].    Clayton Bibles, PA-C 01/20/15 [id number]  Leo Grosser, MD 01/20/15 [phone]

## 2015-01-19 NOTE — ED Notes (Signed)
Called main lab in regards to urine results, spoke to [place].

## 2015-01-19 NOTE — ED Notes (Signed)
PER EMS: pt from home with reports of heart palpitations lasting about 30 minutes PTA. She has a defibrillator but states it has not shocked her. She also reports having weakness and fevers at home. EMS adm 1000mg  of Tylenol and now temp 100.7 upon arrival. Pt A&Ox4. BP-124/78, HR-118, 100% RA, RR-18, CBG-210

## 2015-01-20 DIAGNOSIS — Z833 Family history of diabetes mellitus: Secondary | ICD-10-CM | POA: Diagnosis not present

## 2015-01-20 DIAGNOSIS — I4581 Long QT syndrome: Secondary | ICD-10-CM

## 2015-01-20 DIAGNOSIS — I272 Other secondary pulmonary hypertension: Secondary | ICD-10-CM | POA: Diagnosis present

## 2015-01-20 DIAGNOSIS — J189 Pneumonia, unspecified organism: Secondary | ICD-10-CM | POA: Diagnosis present

## 2015-01-20 DIAGNOSIS — Z95810 Presence of automatic (implantable) cardiac defibrillator: Secondary | ICD-10-CM

## 2015-01-20 DIAGNOSIS — E1122 Type 2 diabetes mellitus with diabetic chronic kidney disease: Secondary | ICD-10-CM | POA: Diagnosis not present

## 2015-01-20 DIAGNOSIS — E118 Type 2 diabetes mellitus with unspecified complications: Secondary | ICD-10-CM | POA: Diagnosis not present

## 2015-01-20 DIAGNOSIS — E872 Acidosis: Secondary | ICD-10-CM | POA: Diagnosis present

## 2015-01-20 DIAGNOSIS — R162 Hepatomegaly with splenomegaly, not elsewhere classified: Secondary | ICD-10-CM | POA: Diagnosis present

## 2015-01-20 DIAGNOSIS — I471 Supraventricular tachycardia: Secondary | ICD-10-CM | POA: Diagnosis not present

## 2015-01-20 DIAGNOSIS — M797 Fibromyalgia: Secondary | ICD-10-CM | POA: Diagnosis present

## 2015-01-20 DIAGNOSIS — K648 Other hemorrhoids: Secondary | ICD-10-CM | POA: Diagnosis not present

## 2015-01-20 DIAGNOSIS — E1140 Type 2 diabetes mellitus with diabetic neuropathy, unspecified: Secondary | ICD-10-CM | POA: Diagnosis present

## 2015-01-20 DIAGNOSIS — E119 Type 2 diabetes mellitus without complications: Secondary | ICD-10-CM | POA: Diagnosis not present

## 2015-01-20 DIAGNOSIS — D862 Sarcoidosis of lung with sarcoidosis of lymph nodes: Secondary | ICD-10-CM | POA: Diagnosis not present

## 2015-01-20 DIAGNOSIS — R509 Fever, unspecified: Secondary | ICD-10-CM | POA: Diagnosis not present

## 2015-01-20 DIAGNOSIS — R002 Palpitations: Secondary | ICD-10-CM

## 2015-01-20 DIAGNOSIS — R748 Abnormal levels of other serum enzymes: Secondary | ICD-10-CM | POA: Diagnosis present

## 2015-01-20 DIAGNOSIS — R42 Dizziness and giddiness: Secondary | ICD-10-CM | POA: Diagnosis not present

## 2015-01-20 DIAGNOSIS — I4901 Ventricular fibrillation: Secondary | ICD-10-CM | POA: Diagnosis not present

## 2015-01-20 DIAGNOSIS — Z91041 Radiographic dye allergy status: Secondary | ICD-10-CM | POA: Diagnosis not present

## 2015-01-20 DIAGNOSIS — Z8673 Personal history of transient ischemic attack (TIA), and cerebral infarction without residual deficits: Secondary | ICD-10-CM | POA: Diagnosis not present

## 2015-01-20 DIAGNOSIS — I10 Essential (primary) hypertension: Secondary | ICD-10-CM

## 2015-01-20 DIAGNOSIS — J45909 Unspecified asthma, uncomplicated: Secondary | ICD-10-CM | POA: Diagnosis present

## 2015-01-20 DIAGNOSIS — I472 Ventricular tachycardia: Secondary | ICD-10-CM | POA: Diagnosis not present

## 2015-01-20 DIAGNOSIS — Z79899 Other long term (current) drug therapy: Secondary | ICD-10-CM | POA: Diagnosis not present

## 2015-01-20 DIAGNOSIS — D869 Sarcoidosis, unspecified: Secondary | ICD-10-CM | POA: Diagnosis not present

## 2015-01-20 DIAGNOSIS — J449 Chronic obstructive pulmonary disease, unspecified: Secondary | ICD-10-CM | POA: Diagnosis not present

## 2015-01-20 DIAGNOSIS — Z794 Long term (current) use of insulin: Secondary | ICD-10-CM | POA: Diagnosis not present

## 2015-01-20 DIAGNOSIS — Y95 Nosocomial condition: Secondary | ICD-10-CM | POA: Diagnosis present

## 2015-01-20 DIAGNOSIS — Z885 Allergy status to narcotic agent status: Secondary | ICD-10-CM | POA: Diagnosis not present

## 2015-01-20 DIAGNOSIS — D860 Sarcoidosis of lung: Secondary | ICD-10-CM | POA: Diagnosis present

## 2015-01-20 DIAGNOSIS — I340 Nonrheumatic mitral (valve) insufficiency: Secondary | ICD-10-CM | POA: Diagnosis not present

## 2015-01-20 DIAGNOSIS — G40909 Epilepsy, unspecified, not intractable, without status epilepticus: Secondary | ICD-10-CM | POA: Diagnosis present

## 2015-01-20 DIAGNOSIS — W19XXXD Unspecified fall, subsequent encounter: Secondary | ICD-10-CM | POA: Diagnosis not present

## 2015-01-20 DIAGNOSIS — E876 Hypokalemia: Secondary | ICD-10-CM

## 2015-01-20 DIAGNOSIS — D8689 Sarcoidosis of other sites: Secondary | ICD-10-CM | POA: Diagnosis present

## 2015-01-20 DIAGNOSIS — Z881 Allergy status to other antibiotic agents status: Secondary | ICD-10-CM | POA: Diagnosis not present

## 2015-01-20 DIAGNOSIS — Z888 Allergy status to other drugs, medicaments and biological substances status: Secondary | ICD-10-CM | POA: Diagnosis not present

## 2015-01-20 DIAGNOSIS — R634 Abnormal weight loss: Secondary | ICD-10-CM | POA: Diagnosis present

## 2015-01-20 LAB — IRON AND TIBC
Iron: 31 ug/dL (ref 28–170)
SATURATION RATIOS: 18 % (ref 10.4–31.8)
TIBC: 176 ug/dL — AB (ref 250–450)
UIBC: 145 ug/dL

## 2015-01-20 LAB — MAGNESIUM: MAGNESIUM: 1.8 mg/dL (ref 1.7–2.4)

## 2015-01-20 LAB — CBC
HEMATOCRIT: 33.7 % — AB (ref 36.0–46.0)
Hemoglobin: 10.9 g/dL — ABNORMAL LOW (ref 12.0–15.0)
MCH: 28 pg (ref 26.0–34.0)
MCHC: 32.3 g/dL (ref 30.0–36.0)
MCV: 86.6 fL (ref 78.0–100.0)
Platelets: 331 10*3/uL (ref 150–400)
RBC: 3.89 MIL/uL (ref 3.87–5.11)
RDW: 13.7 % (ref 11.5–15.5)
WBC: 7.5 10*3/uL (ref 4.0–10.5)

## 2015-01-20 LAB — COMPREHENSIVE METABOLIC PANEL
ALK PHOS: 609 U/L — AB (ref 38–126)
ALT: 12 U/L — ABNORMAL LOW (ref 14–54)
ANION GAP: 7 (ref 5–15)
AST: 35 U/L (ref 15–41)
Albumin: 1.8 g/dL — ABNORMAL LOW (ref 3.5–5.0)
BILIRUBIN TOTAL: 0.5 mg/dL (ref 0.3–1.2)
CALCIUM: 8.1 mg/dL — AB (ref 8.9–10.3)
CO2: 27 mmol/L (ref 22–32)
Chloride: 96 mmol/L — ABNORMAL LOW (ref 101–111)
Creatinine, Ser: 0.55 mg/dL (ref 0.44–1.00)
GFR calc Af Amer: >60 mL/min (ref >60)
Glucose, Bld: 131 mg/dL — ABNORMAL HIGH (ref 65–99)
POTASSIUM: 3.8 mmol/L (ref 3.5–5.1)
Sodium: 130 mmol/L — ABNORMAL LOW (ref 135–145)
TOTAL PROTEIN: 7.7 g/dL (ref 6.5–8.1)

## 2015-01-20 LAB — TROPONIN I
Troponin I: <0.03 ng/mL (ref <0.031)
Troponin I: <0.03 ng/mL (ref <0.031)
Troponin I: <0.03 ng/mL (ref <0.031)

## 2015-01-20 LAB — C-REACTIVE PROTEIN: CRP: 13.2 mg/dL — AB (ref <1.0)

## 2015-01-20 LAB — SEDIMENTATION RATE: Sed Rate: 96 mm/hr — ABNORMAL HIGH (ref 0–22)

## 2015-01-20 LAB — D-DIMER, QUANTITATIVE (NOT AT ARMC): D DIMER QUANT: 3.79 ug{FEU}/mL — AB (ref 0.00–0.48)

## 2015-01-20 LAB — GLUCOSE, CAPILLARY
GLUCOSE-CAPILLARY: 115 mg/dL — AB (ref 65–99)
GLUCOSE-CAPILLARY: 139 mg/dL — AB (ref 65–99)
Glucose-Capillary: 139 mg/dL — ABNORMAL HIGH (ref 65–99)
Glucose-Capillary: 136 mg/dL — ABNORMAL HIGH (ref 65–99)

## 2015-01-20 LAB — FERRITIN: FERRITIN: 932 ng/mL — AB (ref 11–307)

## 2015-01-20 LAB — LACTIC ACID, PLASMA: LACTIC ACID, VENOUS: 4.1 mmol/L — AB (ref 0.5–2.0)

## 2015-01-20 MED ORDER — POTASSIUM CHLORIDE CRYS ER 20 MEQ PO TBCR
20.0000 meq | EXTENDED_RELEASE_TABLET | Freq: Every day | ORAL | Status: DC
  Administered 2015-01-20 – 2015-01-24 (×5): 20 meq via ORAL
  Filled 2015-01-20 (×8): qty 1

## 2015-01-20 MED ORDER — SODIUM CHLORIDE 0.9 % IV SOLN
1000.0000 mL | INTRAVENOUS | Status: DC
  Administered 2015-01-20 – 2015-01-29 (×15): 1000 mL via INTRAVENOUS

## 2015-01-20 MED ORDER — OLOPATADINE HCL 0.1 % OP SOLN
1.0000 [drp] | Freq: Two times a day (BID) | OPHTHALMIC | Status: DC
  Administered 2015-01-20 – 2015-01-29 (×18): 1 [drp] via OPHTHALMIC
  Filled 2015-01-20: qty 5

## 2015-01-20 MED ORDER — SODIUM CHLORIDE 0.9 % IV SOLN: 1000.0000 mL | INTRAVENOUS | Status: DC

## 2015-01-20 MED ORDER — SODIUM CHLORIDE 0.9 % IJ SOLN: 3.0000 mL | INTRAMUSCULAR | Status: DC | PRN

## 2015-01-20 MED ORDER — SODIUM CHLORIDE 0.9 % IV SOLN: 250.0000 mL | INTRAVENOUS | Status: DC | PRN

## 2015-01-20 MED ORDER — PROPRANOLOL HCL 80 MG PO TABS
80.0000 mg | ORAL_TABLET | Freq: Two times a day (BID) | ORAL | Status: DC
  Administered 2015-01-20 – 2015-01-29 (×18): 80 mg via ORAL
  Filled 2015-01-20 (×20): qty 1

## 2015-01-20 MED ORDER — CYCLOSPORINE 0.05 % OP EMUL
1.0000 [drp] | Freq: Two times a day (BID) | OPHTHALMIC | Status: DC
  Administered 2015-01-20 – 2015-01-28 (×17): 1 [drp] via OPHTHALMIC
  Filled 2015-01-20 (×21): qty 1

## 2015-01-20 MED ORDER — POTASSIUM CHLORIDE CRYS ER 20 MEQ PO TBCR
40.0000 meq | EXTENDED_RELEASE_TABLET | Freq: Once | ORAL | Status: AC
  Administered 2015-01-20: 40 meq via ORAL
  Filled 2015-01-20: qty 2

## 2015-01-20 MED ORDER — BUDESONIDE 0.25 MG/2ML IN SUSP
0.2500 mg | Freq: Two times a day (BID) | RESPIRATORY_TRACT | Status: DC
  Administered 2015-01-20 – 2015-01-29 (×16): 0.25 mg via RESPIRATORY_TRACT
  Filled 2015-01-20 (×34): qty 2

## 2015-01-20 MED ORDER — ACETAMINOPHEN 325 MG PO TABS
650.0000 mg | ORAL_TABLET | Freq: Four times a day (QID) | ORAL | Status: DC | PRN
  Administered 2015-01-21 – 2015-01-26 (×11): 650 mg via ORAL
  Filled 2015-01-20 (×12): qty 2

## 2015-01-20 MED ORDER — PANTOPRAZOLE SODIUM 40 MG PO TBEC
40.0000 mg | DELAYED_RELEASE_TABLET | Freq: Every day | ORAL | Status: DC
  Administered 2015-01-20 – 2015-01-29 (×10): 40 mg via ORAL
  Filled 2015-01-20 (×11): qty 1

## 2015-01-20 MED ORDER — ALBUTEROL SULFATE (2.5 MG/3ML) 0.083% IN NEBU: 5.0000 mg | INHALATION_SOLUTION | RESPIRATORY_TRACT | Status: DC | PRN

## 2015-01-20 MED ORDER — ONDANSETRON HCL 4 MG/2ML IJ SOLN
4.0000 mg | Freq: Four times a day (QID) | INTRAMUSCULAR | Status: DC | PRN
  Administered 2015-01-20 – 2015-01-24 (×3): 4 mg via INTRAVENOUS
  Filled 2015-01-20 (×3): qty 2

## 2015-01-20 MED ORDER — ATENOLOL 50 MG PO TABS
50.0000 mg | ORAL_TABLET | Freq: Two times a day (BID) | ORAL | Status: DC
  Administered 2015-01-20: 50 mg via ORAL
  Filled 2015-01-20 (×2): qty 1

## 2015-01-20 MED ORDER — INSULIN GLARGINE 100 UNIT/ML ~~LOC~~ SOLN
35.0000 [IU] | Freq: Every day | SUBCUTANEOUS | Status: DC
  Administered 2015-01-20 – 2015-01-21 (×2): 35 [IU] via SUBCUTANEOUS
  Filled 2015-01-20 (×4): qty 0.35

## 2015-01-20 MED ORDER — SODIUM CHLORIDE 0.9 % IJ SOLN
3.0000 mL | Freq: Two times a day (BID) | INTRAMUSCULAR | Status: DC
  Administered 2015-01-20 – 2015-01-25 (×4): 3 mL via INTRAVENOUS

## 2015-01-20 MED ORDER — FLUTICASONE PROPIONATE 50 MCG/ACT NA SUSP: 2.0000 | Freq: Every day | NASAL | Status: DC | PRN

## 2015-01-20 MED ORDER — SODIUM CHLORIDE 0.9 % IJ SOLN
3.0000 mL | Freq: Two times a day (BID) | INTRAMUSCULAR | Status: DC
  Administered 2015-01-20: 3 mL via INTRAVENOUS

## 2015-01-20 MED ORDER — LINACLOTIDE 145 MCG PO CAPS
145.0000 ug | ORAL_CAPSULE | Freq: Every day | ORAL | Status: DC
  Administered 2015-01-22 – 2015-01-24 (×2): 145 ug via ORAL
  Filled 2015-01-20 (×10): qty 1

## 2015-01-20 MED ORDER — ENOXAPARIN SODIUM 40 MG/0.4ML ~~LOC~~ SOLN
40.0000 mg | Freq: Every day | SUBCUTANEOUS | Status: DC
  Administered 2015-01-21 – 2015-01-24 (×3): 40 mg via SUBCUTANEOUS
  Filled 2015-01-20 (×7): qty 0.4

## 2015-01-20 MED ORDER — FUROSEMIDE 20 MG PO TABS
20.0000 mg | ORAL_TABLET | Freq: Every day | ORAL | Status: DC
  Administered 2015-01-20: 20 mg via ORAL
  Filled 2015-01-20: qty 1

## 2015-01-20 MED ORDER — SODIUM CHLORIDE 0.9 % IV SOLN: INTRAVENOUS | Status: DC

## 2015-01-20 MED ORDER — INSULIN ASPART 100 UNIT/ML ~~LOC~~ SOLN
0.0000 [IU] | Freq: Three times a day (TID) | SUBCUTANEOUS | Status: DC
  Administered 2015-01-20: 1 [IU] via SUBCUTANEOUS
  Administered 2015-01-21 – 2015-01-26 (×3): 2 [IU] via SUBCUTANEOUS
  Administered 2015-01-26: 3 [IU] via SUBCUTANEOUS
  Administered 2015-01-26: 7 [IU] via SUBCUTANEOUS
  Administered 2015-01-27: 9 [IU] via SUBCUTANEOUS

## 2015-01-20 NOTE — Progress Notes (Signed)
Utilization Review Completed.[name] T9/08/2014  

## 2015-01-20 NOTE — Consult Note (Addendum)
Marland Kitchen    ELECTROPHYSIOLOGY CONSULT NOTE  Patient ID: [name], MRN: [id number], DOB/AGE: [birth year] 43 y.o. Admit date: 01/19/2015 Date of Consult: 01/20/2015  Primary Physician: Cordelia Poche, MD Primary Cardiologist: GT  Chief Complaint: palps/sinus tachy   HPI [name] is a 44 y.o. female with a history of long QT syndrome and a very strong family history of sudden cardiac death, status post ICD implantation. She also has a history of SVT and is status post catheter ablation. The family has known LQTS 2.  Admitted last pm with fever 102 and sinus tachy;  Lactate elevated   She had palps at home which persisted until arrival of ems and perhaps beyond-- no records available   A lot of old records were reviewed(laudably by [facility]!!!)  and noted to have chronic mild lactate elevation.  With lower grade fever, source as to infection questioned and PE issue raised  She continues w unexplained weight loss, although over the last few weeks this has apparently stablized  She has lost about 40% of her body weight    Past Medical History  Diagnosis Date  . Long Q-T syndrome   . Palpitation   . Seizure disorder     none recently  . Abdominal pain, periumbilic   . Esophageal stricture 2005/2009    esophageal strictures dilated 2005, 2009  . Sarcoidosis   . Allergic rhinitis   . Bronchitis   . GERD (gastroesophageal reflux disease) 2005  . Asthma   . Fibromyalgia   . Stroke     QUESTIONABLE TIA   . Neuropathy in diabetes   . Seizures     HISTORY OF TAKING KLONIPIN NOT RX AT THE MOMENT  . Cancer     Nodules in lungs pt believes were cancerous pt unsure  . Arthritis   . Internal hemorrhoids 2010    2011 band ligation of int rrhoids.  . Eye hemorrhage     bilateral  . Fibroid, uterine   . Automatic implantable cardioverter-defibrillator in situ 2006, replaced 2008    MEDTRONIC  . DM (diabetes mellitus) 1997/1998    type 1, initially gestational but soon after child birth  developed IDDM  . History of blood transfusion years ago      Surgical History:  Past Surgical History  Procedure Laterality Date  . Tubal ligation    . Hand surgery    . Svt ablation    . Automatic implantable cardiac defibrillator situ  2006,     ICD-Medtronic   Remote - Yes   . Hemorrhoid banding  03/2010  . Dilitation & currettage/hystroscopy with hydrothermal ablation N/A 06/07/2013    Procedure: DILATATION & CURETTAGE/HYSTEROSCOPY WITH attempted HYDROTHERMAL ABLATION;  Surgeon: Frederico Hamman, MD;  Location: [facility];  Service: Gynecology;  Laterality: N/A;  . Esophagogastroduodenoscopy (egd) with propofol N/A 10/24/2014    Procedure: ESOPHAGOGASTRODUODENOSCOPY (EGD) WITH PROPOFOL;  Surgeon: Inda Castle, MD;  Location: [facility];  Service: Endoscopy;  Laterality: N/A;  . Total abdominal hysterectomy w/ bilateral salpingoophorectomy  11/06/14    at [facility]. for menorrhagia, pelvic pain and enlarging uterine fibroids  . Exploratory laparotomy  11/06/14    evacuation of post TAH/BSO hematoma  . Abdominal hysterectomy    . Salpingectomy    . Cervix removal       Home Meds: Prior to Admission medications   Medication Sig Start Date End Date Taking? Authorizing Provider  acetaminophen (TYLENOL) 500 MG tablet Take 1,000 mg by mouth every 6 (six)  hours as needed for mild pain or fever.   Yes Historical Provider, MD  atenolol (TENORMIN) 50 MG tablet Take 1 tablet (50 mg total) by mouth 2 (two) times daily. 11/26/14  Yes Evans Lance, MD  beclomethasone (QVAR) 80 MCG/ACT inhaler Inhale 2 puffs into the lungs 2 (two) times daily. 09/05/14  Yes Elsie Stain, MD  clonazePAM (KLONOPIN) 0.5 MG tablet Take 1 tablet (0.5 mg total) by mouth daily. 12/24/14  Yes Mariel Aloe, MD  cyclobenzaprine (FLEXERIL) 10 MG tablet Take 10 mg by mouth 3 (three) times daily as needed for muscle spasms.    Yes Historical Provider, MD  cycloSPORINE (RESTASIS) 0.05 % ophthalmic emulsion Place 1 drop into  both eyes 2 (two) times daily.    Yes Historical Provider, MD  fluticasone (FLONASE) 50 MCG/ACT nasal spray Place 2 sprays into both nostrils daily as needed for allergies.    Yes Historical Provider, MD  furosemide (LASIX) 20 MG tablet Take 1 tablet (20 mg total) by mouth daily. 12/10/14  Yes Evans Lance, MD  gabapentin (NEURONTIN) 100 MG capsule Take 1 capsule (100 mg total) by mouth 3 (three) times daily. 06/29/14  Yes Tanda Rockers, MD  glimepiride (AMARYL) 2 MG tablet Take 2 mg by mouth 2 (two) times daily at 8 am and 10 pm.    Yes Historical Provider, MD  LANTUS SOLOSTAR 100 UNIT/ML Solostar Pen Inject 35 Units into the skin at bedtime.   Yes Historical Provider, MD  Linaclotide Rolan Lipa) 145 MCG CAPS capsule Take 1 capsule (145 mcg total) by mouth daily. 12/11/14  Yes Jessica D Zehr, PA-C  meclizine (ANTIVERT) 25 MG tablet Take 25 mg by mouth 3 (three) times daily as needed for dizziness (take 1/2 to 1 tablet by mouth as needed for dizziness).   Yes Historical Provider, MD  NOVOLOG FLEXPEN 100 UNIT/ML FlexPen Inject 4-8 Units into the skin 3 (three) times daily.   Yes Historical Provider, MD  Olopatadine HCl (PATADAY) 0.2 % SOLN Place 1 drop into both eyes 2 (two) times daily.    Yes Historical Provider, MD  ondansetron (ZOFRAN) 4 MG tablet Take 1 tablet (4 mg total) by mouth every 6 (six) hours as needed for nausea or vomiting. 01/15/15  Yes Gatha Mayer, MD  pantoprazole (PROTONIX) 40 MG tablet Take 1 tablet (40 mg total) by mouth daily at 6 (six) AM. 11/24/14  Yes Rhonda G Barrett, PA-C  potassium chloride SA (K-DUR,KLOR-CON) 20 MEQ tablet Take 1 tablet (20 mEq total) by mouth daily. 11/24/14  Yes Lonn Georgia, PA-C    Inpatient Medications:  . atenolol  50 mg Oral BID  . budesonide (PULMICORT) nebulizer solution  0.25 mg Nebulization BID  . cycloSPORINE  1 drop Both Eyes BID  . enoxaparin (LOVENOX) injection  40 mg Subcutaneous Daily  . furosemide  20 mg Oral Daily  . insulin aspart   0-9 Units Subcutaneous TID WC  . insulin glargine  35 Units Subcutaneous QHS  . Linaclotide  145 mcg Oral Daily  . olopatadine  1 drop Both Eyes BID  . pantoprazole  40 mg Oral Q0600  . potassium chloride SA  20 mEq Oral Daily  . sodium chloride  3 mL Intravenous Q12H    Allergies:  Allergies  Allergen Reactions  . Contrast Media [Iodinated Diagnostic Agents] Anaphylaxis and Shortness Of Breath    Needed to be defibrillated   . Augmentin [Amoxicillin-Pot Clavulanate] Other (See Comments)    "stomach hurt"  .  Codeine Nausea And Vomiting    jittery  . Iohexol      Code: RASH, Desc: PT STATES SHE IS ALLERGIC TO IV CONTRAST 05/28/06/RM, Onset Date: 16109604   . Meperidine Hcl Nausea And Vomiting  . Morphine And Related Nausea And Vomiting    Social History   Social History  . Marital Status: Single    Spouse Name: N/A  . Number of Children: N/A  . Years of Education: N/A   Occupational History  . Not on file.   Social History Main Topics  . Smoking status: Never Smoker   . Smokeless tobacco: Never Used  . Alcohol Use: No     Comment: occ.  . Drug Use: No  . Sexual Activity: No   Other Topics Concern  . Not on file   Social History Narrative     Family History  Problem Relation Age of Onset  . Cancer    . Diabetes       ROS:  Please see the history of present illness.     All other systems reviewed and negative.    Physical Exam   Blood pressure 120/84, pulse 107, temperature 98.6 F (37 C), temperature source Oral, resp. rate 19, height 5\' 7"  (1.702 m), weight 132 lb (59.875 kg), SpO2 98 %. General: Well developed, well nourished female in no acute distress. Head: Normocephalic, atraumatic, sclera non-icteric, no xanthomas, nares are without discharge. EENT: normal Lymph Nodes:  none Back: without scoliosis/kyphosis , no CVA tendersness Neck: Negative for carotid bruits. JVD not elevated. Lungs: Clear bilaterally to auscultation without wheezes, rales,  or rhonchi. Breathing is unlabored. Heart: RRR with S1 S2.  2/6 systolic murmur , rubs, or gallops appreciated. Abdomen: Soft, non-tender, non-distended with normoactive bowel sounds. No hepatomegaly. No rebound/guarding. No obvious abdominal masses. Msk:  Strength and tone appear normal for age. Extremities: No clubbing or cyanosis. No  edema.  Distal pedal pulses are 2+ and equal bilaterally. Skin: Warm and Dry Neuro: Alert and oriented X 3. CN III-XII intact Grossly normal sensory and motor function . Psych:  Responds to questions appropriately with a normal affect.      Labs: Cardiac Enzymes  Recent Labs  01/20/15 0335 01/20/15 0935  TROPONINI <0.03 <0.03   CBC Lab Results  Component Value Date   WBC 7.5 01/20/2015   HGB 10.9* 01/20/2015   HCT 33.7* 01/20/2015   MCV 86.6 01/20/2015   PLT 331 01/20/2015   PROTIME: No results for input(s): LABPROT, INR in the last 72 hours. Chemistry  Recent Labs Lab 01/20/15 0935  NA 130*  K 3.8  CL 96*  CO2 27  BUN <5*  CREATININE 0.55  CALCIUM 8.1*  PROT 7.7  BILITOT 0.5  ALKPHOS 609*  ALT 12*  AST 35  GLUCOSE 131*   Lipids Lab Results  Component Value Date   CHOL 122* 12/24/2014   HDL 14* 12/24/2014   LDLCALC 61 12/24/2014   TRIG 236* 12/24/2014   BNP No results found for: PROBNP Thyroid Function Tests: No results for input(s): TSH, T4TOTAL, T3FREE, THYROIDAB in the last 72 hours.  Invalid input(s): FREET3    Miscellaneous Lab Results  Component Value Date   DDIMER  03/01/2009    <0.22        AT THE INHOUSE ESTABLISHED CUTOFF VALUE OF 0.48 ug/mL FEU, THIS ASSAY HAS BEEN DOCUMENTED IN THE LITERATURE TO HAVE A SENSITIVITY AND NEGATIVE PREDICTIVE VALUE OF AT LEAST 98 TO 99%.  THE TEST RESULT SHOULD  BE CORRELATED WITH AN ASSESSMENT OF THE CLINICAL PROBABILITY OF DVT / VTE.    Radiology/Studies:  Ct Abdomen Pelvis Wo Contrast  01/20/2015   CLINICAL DATA:  Acute onset of epigastric abdominal pain and  swelling. Initial encounter.  EXAM: CT ABDOMEN AND PELVIS WITHOUT CONTRAST  TECHNIQUE: Multidetector CT imaging of the abdomen and pelvis was performed following the standard protocol without IV contrast.  COMPARISON:  CT of the abdomen and pelvis performed 10/25/2014, and abdominal ultrasound performed 10/08/2014  FINDINGS: Diffuse tiny nodular opacities within the visualized right lower lobe and right middle lobe raise suspicion for atypical infection. An AICD lead is partially imaged.  Splenomegaly and hepatomegaly are again noted. The spleen measures 14.3 cm in length, while the liver measures 27.9 cm in length. A slightly nodular contour to the liver raises concern for mild hepatic cirrhosis. 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 The vasculature is not well assessed without contrast.  The gallbladder is relatively decompressed and within normal limits. The pancreas and adrenal glands are unremarkable.  The kidneys are unremarkable in appearance. There is no evidence of hydronephrosis. No renal or ureteral stones are seen. No perinephric stranding is appreciated.  No free fluid is identified. The small bowel is unremarkable in appearance. The stomach is within normal limits. No acute vascular abnormalities are seen.  The appendix is normal in caliber and contains air without evidence of appendicitis. The colon is unremarkable in appearance.  The bladder is moderately distended and grossly unremarkable. The patient is status post hysterectomy. No suspicious adnexal masses are seen. No inguinal lymphadenopathy is seen.  No acute osseous abnormalities are identified.  IMPRESSION: 1. Diffuse tiny nodular opacities within the right lower lung lobe and right middle lung lobe raise suspicion for atypical pneumonia. This is new from the prior study. 2. Splenomegaly and hepatomegaly again noted. 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 Apparent gastric varices suggested, not well  seen without contrast.   Electronically Signed   By: Garald Balding M.D.   On: 01/20/2015 00:12   Dg Chest 2 View  01/19/2015   CLINICAL DATA:  Cough and fever  EXAM: CHEST  2 VIEW  COMPARISON:  06/29/2014  FINDINGS: AICD remains in good position. Heart size normal. Negative for heart failure. Lungs are clear without infiltrate or effusion.  IMPRESSION: No active cardiopulmonary disease.   Electronically Signed   By: Franchot Gallo M.D.   On: 01/19/2015 21:44    EKG:  Sinus 110 17.08/54   Assessment and Plan:  LQTS2  Hypokalemia  Sinus Tachy  Fever  SVT  S/p RFCA  Weight loss unintentional     At this juncture do not feel there is primary cardiac issue; and presume sinus tach is secondary with fevers etc It is possible taht this was caused by recurrence of SVt but there is no way to identfiy--we can reprogram ICD to use a monitoring zone-- her symptoms are NOT CONSISTENT with LQTS  Interestingly data suggest that inderal or nadolol are the preferred betablockers, and if they are affordable, we should switch to equivalent dose  PE workup per primary team--maybe a ddimer will help and not confuse- but with fever of 102  If this is real, would prefer to ascribe to infection   Would discontinue lasix  If recurrent issues with fluid retention ,k would manage with education and or aldactone   Hypokalemia in the context of her LQTS is potentially problematic    Virl Axe

## 2015-01-20 NOTE — H&P (Signed)
[facility] Admission History and Physical Service Pager: [phone]  Patient name: [name] Medical record number: [id number] Date of birth: November 20, 1970 Age: 44 y.o. Gender: female  Primary Care Provider: Cordelia Poche, MD Consultants: None Code Status: Full  Chief Complaint:  Palpitations,fever, weakness  Assessment and Plan: [name] is a 44 y.o. female presenting with sensation of racing heart and fever today to 102 . PMH is significant for Long QT, history of torsades, AICD in place, DM2, Sarcoidosis  SIRS / Lactic acidosis- On admission pt was febrile to 100.7, HR 102 with lactate to 5.37 which trended to 4.3. On CT abd pelvis, small focal lesions seen in lower lung fields concerning for atypical pneumonia per radiology read. However, clinically pt endorses stable cough with no increased sputum production, WBC 7.7 on presentation, and with concern for chronically elevated lactate as it has been elevated to 3.7 at [facility], and elevated to 2.09 on 11/22/2014 here, making infectious cause of symptoms less likely. Must also consider alternate causes of presentation, including PE, vs prolonged tachyarhythmia with decreased tissue perfusion, worsenig Pulmonary sarcoidosis.   Wells score 4.5 for vitals signs consistent with PE and, tachycardia. She additionally had recent surgery ( TAH/BSO) on 11/21/2014 further increasing her clot risk. However, given recent hospitalization with new pulmonary nodules must consider HCAP.  - continue Vanc/Zosyn - follow BCx, UCx - trend CBC - consider CT chest in AM - trend lactic acid  Cardiac: prolonged QTc to 545 on presentation, with significant history of prolonged Qtc, torsades de pointes, SVT with subsequent AICD placement. Last echo 6/13 at [facility] LV EF => 70%, borderline left ventricular hypertrophy, else largely normal. Lack of clinical signs of heart failure reassuring. Pt has been noted to have daily  dyspnea of unclear etiology ( 7/25) so was sterted on Lasix 20 qD by cardiology. Present presentation with palpitations and continued dyspnea concerning for repeat tachyarythmia. Chest pain associated with palpitation concerning for ACS - Cariology consult in AM - Pursue AICD interrogation in AM - Telemetry - continue home lasix - trend troponin  - repeat EKG in AM  Hypokalemia- K 3.2 - K continue home K supplements - Kdur 40 mEq - check Mag  Sarcoid/Obstructive chronic bronchitis- Stable per last Pulmonology assessment on 01/07/2015, current presenation concerning fro worsening disease status - CRP to help determine sarcoidosis activity, however data is limited regarding correlation between sarcoid disease status and CRP ([place], [employer], Lo K, Judson MA, Baughman R, [employer], C-reactive protein predicts response to infliximab in patients with chronic sarcoidosis. Sarcoidosis Vasc Diffuse Lung Dis. 2010 Jul;27(1):49-56) - Pulmonology consult in AM - continue home Qvar  DM2- Last A1c 6.3,  12/24/2014, stable - Lantus 35u qd - SSI - CBG ACHS  Weight loss/Anorexia-  Approximately 55 lb weight loss from 12/2013 to 01/20/2015 associated with decreased appetite and early satiety. Extensive work up by GI with EGD, CT, US abdomen and gastric emptying study noted to be unremarkable in terms of causes of weight loss. Potentially secondary to sardoidosis, and decreased PO intake. CT Ab did show hepatosplenomegaly with possible gastric varices.  - Nutrition consult- follow recs   Elevated Alk phos- Alk phos 674, elevated since 09/2014. GHTT on 01/15/2015 elevated to 372, remarkable for intrahepatic cause of elevation. Normal bilirubin and liver transaminases, recent and [place] negative for liver or gall bladder pathology.  Negative Hep B serologies 12/24/2014, pending Hep C serologies but largely normal LFTS makes viral heptatitis  etiology less likely. Concerning for sarcoid involvement to liver,  vs other intra hepatic cause  - GI consult in AM   Hepato-splenomegally- persistent since being noted on 10/25/2014, now with concern for gastric varices and nodular liver edge concerning for cirrhosis. ? Possible amyloidosis given elevated ALP, previously elevated T protein even with low albumin - GI consult for recs in AM - check SPEP/UPEP - Check CRP/ESR - Iron studeis - GI recently placed future orders for ANA, IgG, Mitochondrial & Smooth muscle Antibodies; consider obtaining while inpatient  FEN/GI: Carb/heart, SLIV Prophylaxis: Lovenox  Disposition: Admit to Family medicine teaching service, Dr. Gwendlyn Deutscher  History of Present Illness:  [name] is a 44 y.o. female presenting with fever that started yesterday night and continued today. Tmax was 102. Later today she was sitting watching television and suddenly her heart started racing. She laid on her bed to help it to stop but it persisted for about 35 minutes. She had some shortness of breath and chest pain associated with the palpitations that stopped as soon as her heart stopped racing. She occasionally gets chest pains and shortness of breath like this regardless of what she is doing and even in the absence of palpitations. She does have an AICD in place and thinks this may exacerbate her chest pains.  She additionally has chronic cough associated with her obstructive chronic broncitis and sarcoid. She denies nausea.vomtting, diarrhea  Review Of Systems: Per HPI   Patient Active Problem List   Diagnosis Date Noted  . HCAP (healthcare-associated pneumonia) 01/20/2015  . Chronic fatigue 01/01/2015  . Nausea without vomiting 12/11/2014  . Dyspnea 12/10/2014  . Hypokalemia 11/24/2014  . Pressure ulcer 11/23/2014  . Weight loss   . Nausea with vomiting   . Elevated alkaline phosphatase level   . Slow transit constipation   . Torsades de pointes 11/22/2014  . Traumatic hematoma of head 11/22/2014  . Long QT interval 11/06/2014   . Cough 09/03/2011  . INTERNAL HEMORRHOIDS WITHOUT MENTION COMP 02/25/2010  . PSVT 02/24/2010  . PRECORDIAL PAIN 08/22/2009  . PALPITATIONS 10/05/2008  . Automatic implantable cardioverter-defibrillator in situ 10/05/2008  . SEIZURE DISORDER 09/26/2008  . Constipation 02/08/2008  . ABDOMINAL PAIN, PERIUMBILICAL 16/02/9603  . Sarcoidosis 03/15/2007  . Diabetes mellitus, type 2 03/15/2007  . Essential hypertension 03/15/2007  . SYNDROME, LONG QT 03/15/2007  . ALLERGIC RHINITIS 03/15/2007  . OBSTRUCTIVE CHRONIC BRONCHITIS 03/15/2007  . GERD 03/15/2007  . ESOPHAGITIS 02/05/2004  . ESOPHAGEAL STRICTURE 02/05/2004   Past Medical History: Past Medical History  Diagnosis Date  . Long Q-T syndrome   . Palpitation   . Seizure disorder     none recently  . Abdominal pain, periumbilic   . Esophageal stricture 2005/2009    esophageal strictures dilated 2005, 2009  . Sarcoidosis   . Allergic rhinitis   . Bronchitis   . GERD (gastroesophageal reflux disease) 2005  . Asthma   . Fibromyalgia   . Stroke     QUESTIONABLE TIA   . Neuropathy in diabetes   . Seizures     HISTORY OF TAKING KLONIPIN NOT RX AT THE MOMENT  . Cancer     Nodules in lungs pt believes were cancerous pt unsure  . Arthritis   . Internal hemorrhoids 2010    2011 band ligation of int rrhoids.  . Eye hemorrhage     bilateral  . Fibroid, uterine   . Automatic implantable cardioverter-defibrillator in situ 2006, replaced 2008    [employer]TRON[employer]  .  DM (diabetes mellitus) 1997/1998    type 1, initially gestational but soon after child birth developed IDDM  . History of blood transfusion years ago   Past Surgical History: Past Surgical History  Procedure Laterality Date  . Tubal ligation    . Hand surgery    . Svt ablation    . Automatic implantable cardiac defibrillator situ  2006,     ICD-Medtronic   Remote - Yes   . Hemorrhoid banding  03/2010  . Dilitation & currettage/hystroscopy with hydrothermal ablation  N/A 06/07/2013    Procedure: DILATATION & CURETTAGE/HYSTEROSCOPY WITH attempted HYDROTHERMAL ABLATION;  Surgeon: Frederico Hamman, MD;  Location: [facility];  Service: Gynecology;  Laterality: N/A;  . Esophagogastroduodenoscopy (egd) with propofol N/A 10/24/2014    Procedure: ESOPHAGOGASTRODUODENOSCOPY (EGD) WITH PROPOFOL;  Surgeon: Inda Castle, MD;  Location: [facility];  Service: Endoscopy;  Laterality: N/A;  . Total abdominal hysterectomy w/ bilateral salpingoophorectomy  11/06/14    at [facility]. for menorrhagia, pelvic pain and enlarging uterine fibroids  . Exploratory laparotomy  11/06/14    evacuation of post TAH/BSO hematoma  . Abdominal hysterectomy    . Salpingectomy    . Cervix removal     Social History: Social History  Substance Use Topics  . Smoking status: Never Smoker   . Smokeless tobacco: Never Used  . Alcohol Use: No     Comment: occ.   Additional social history: Denies tobacco, etoh, druge use Please also refer to relevant sections of EMR.  Family History: Family History  Problem Relation Age of Onset  . Cancer    . Diabetes     Allergies and Medications: Allergies  Allergen Reactions  . Contrast Media [Iodinated Diagnostic Agents] Anaphylaxis and Shortness Of Breath    Needed to be defibrillated   . Augmentin [Amoxicillin-Pot Clavulanate] Other (See Comments)    "stomach hurt"  . Codeine Nausea And Vomiting    jittery  . Iohexol      Code: RASH, Desc: PT STATES SHE IS ALLERGIC TO IV CONTRAST 05/28/06/RM, Onset Date: [id number]   . Meperidine Hcl Nausea And Vomiting  . Morphine And Related Nausea And Vomiting   No current facility-administered medications on file prior to encounter.   Current Outpatient Prescriptions on File Prior to Encounter  Medication Sig Dispense Refill  . atenolol (TENORMIN) 50 MG tablet Take 1 tablet (50 mg total) by mouth 2 (two) times daily. 60 tablet 1  . beclomethasone (QVAR) 80 MCG/ACT inhaler Inhale 2 puffs into the lungs 2  (two) times daily. 1 Inhaler 5  . clonazePAM (KLONOPIN) 0.5 MG tablet Take 1 tablet (0.5 mg total) by mouth daily. 30 tablet 0  . cyclobenzaprine (FLEXERIL) 10 MG tablet Take 10 mg by mouth 3 (three) times daily as needed for muscle spasms.     . cycloSPORINE (RESTASIS) 0.05 % ophthalmic emulsion Place 1 drop into both eyes 2 (two) times daily.     . fluticasone (FLONASE) 50 MCG/ACT nasal spray Place 2 sprays into both nostrils daily as needed for allergies.     . furosemide (LASIX) 20 MG tablet Take 1 tablet (20 mg total) by mouth daily. 30 tablet 11  . gabapentin (NEURONTIN) 100 MG capsule Take 1 capsule (100 mg total) by mouth 3 (three) times daily. 90 capsule 2  . glimepiride (AMARYL) 2 MG tablet Take 2 mg by mouth 2 (two) times daily at 8 am and 10 pm.     . LANTUS SOLOSTAR 100 UNIT/ML Solostar Pen Inject  35 Units into the skin at bedtime.  0  . Linaclotide (LINZESS) 145 MCG CAPS capsule Take 1 capsule (145 mcg total) by mouth daily. 30 capsule 3  . meclizine (ANTIVERT) 25 MG tablet Take 25 mg by mouth 3 (three) times daily as needed for dizziness (take 1/2 to 1 tablet by mouth as needed for dizziness).    . NOVOLOG FLEXPEN 100 UNIT/ML FlexPen Inject 4-8 Units into the skin 3 (three) times daily.  0  . Olopatadine HCl (PATADAY) 0.2 % SOLN Place 1 drop into both eyes 2 (two) times daily.     . ondansetron (ZOFRAN) 4 MG tablet Take 1 tablet (4 mg total) by mouth every 6 (six) hours as needed for nausea or vomiting. 60 tablet 0  . pantoprazole (PROTONIX) 40 MG tablet Take 1 tablet (40 mg total) by mouth daily at 6 (six) AM. 30 tablet 2  . potassium chloride SA (K-DUR,KLOR-CON) 20 MEQ tablet Take 1 tablet (20 mEq total) by mouth daily. 30 tablet 2    Objective: BP 111/66 mmHg  Pulse 85  Temp(Src) 98 F (36.7 C) (Oral)  Resp 21  Ht _0  (1.702 m)  Wt 132 lb (59.875 kg)  BMI 20.67 kg/m2  SpO2 97% Exam: General: NAD, lying in bed HEENT: EOMI, MM Cardiovascular: RRR, no  murmurs Respiratory: CTAB, normal work of breathing Abdomen: soft, diffusely tender to palpation no rebound mild voluntary guarding, difficult to assess organomegally seciondary to pt tolerance of exam MSK: Tenderness to palpation of left costal margin Skin: no rashes or lesion Neuro: No focal deficits Psych: normal mood and affect  Labs and Imaging: CBC BMET   Recent Labs Lab 01/19/15 2059  WBC 7.7  HGB 11.4*  HCT 34.7*  PLT 326    Recent Labs Lab 01/19/15 2059  NA 128*  K 3.2*  CL 89*  CO2 26  BUN <5*  CREATININE 0.69  GLUCOSE 158*  CALCIUM 8.3*     9/3 CT abd/pelvis   IMPRESSION: 1. Diffuse tiny nodular opacities within the right lower lung lobe and right middle lung lobe raise suspicion for atypical pneumonia. This is new from the prior study. 2. Splenomegaly and hepatomegaly again noted. 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 Apparent gastric varices suggested, not well seen without contrast.  9/3 CXR  IMPRESSION: No active cardiopulmonary disease.   Veatrice Bourbon, MD 01/20/2015, 12:43 AM PGY-2, [place] Intern pager: [phone], text pages welcome  I have read and agree with the amended note as above.   Phill Myron, MD, PGY-2 7:12 AM

## 2015-01-20 NOTE — Progress Notes (Signed)
Family Medicine Teaching Service Daily Progress Note Intern Pager: [phone]  Patient name: [name] Medical record number: [id number] Date of birth: Jan 12, 1971 Age: 44 y.o. Gender: female  Primary Care Provider: Cordelia Poche, MD Consultants: cards   Code Status: full  Pt Overview and Major Events to Date:  9/3 - admitted with prolonged palpitations and low grade fever  [name] is a 44 y.o. female presenting with sensation of racing heart and fever today to 102 . PMH is significant for Long QT, history of torsades, AICD in place, DM2, Sarcoidosis  SIRS / Lactic acidosis- On admission pt was febrile to 100.7, HR 102 with lactate to 5.37 which trended to 4.3. On CT abd pelvis, small focal lesions seen in lower lung fields concerning for atypical pneumonia per radiology read. However, clinically pt endorses stable cough with no increased sputum production, WBC 7.7 on presentation, and with concern for chronically elevated lactate as it has been elevated to 3.7 at [facility], and elevated to 2.09 on 11/22/2014 here, making infectious cause of symptoms less likely. Must also consider alternate causes of presentation, including PE, vs prolonged tachyarhythmia with decreased tissue perfusion, worsenig Pulmonary sarcoidosis. Wells score 4.5 for vitals signs consistent with PE and, tachycardia. She additionally had recent surgery (TAH/BSO) on 11/21/2014 further increasing her clot risk. However, given recent hospitalization with new pulmonary nodules must consider HCAP.  - stop Vanc/Zosyn, monitor 24hr off abx (suspect sarcoid is cause of nodules and chronic inflammation with low grade fever), will restart if becomes febrile >101 - follow BCx, UCx - trend CBC - d dimer to r/o possible PE, consider noncontrast CT chest if positive with contrast allergy V/Q unlikely to be helpful in this chronic lung disease patient - continue to trend lactic acid  Cardiac: prolonged  QTc to 545 on presentation, with significant history of prolonged Qtc, torsades de pointes, SVT with subsequent AICD placement. Last echo 6/13 at [facility] LV EF => 70%, borderline left ventricular hypertrophy, else largely normal. Lack of clinical signs of heart failure reassuring. Pt has been noted to have daily dyspnea of unclear etiology ( 7/25) so was sterted on Lasix 20 qD by cardiology. Present presentation with palpitations and continued dyspnea concerning for repeat tachyarythmia. Chest pain associated with palpitation concerning for ACS - Cariology consulted this am, awaiting recs - Pursue AICD interrogation - Telemetry  - continue home lasix - trend troponin   Hypokalemia- K 3.2 - K continue home K supplements - Mag 1.8  Sarcoid/Obstructive chronic bronchitis- Stable per last Pulmonology Loretta on 01/07/2015, current presentation concerning for worsening disease status - ESR 96, CRP 13.2 (unclear whether this represents a significant change from her baseline chronic inflammatory condition) - Consider Pulmonology consult vs outpt f/u - continue home Qvar  DM2- Last A1c 6.3, 12/24/2014, stable - Lantus 35u qd - SSI - CBG ACHS  Weight loss/Anorexia- Approximately 55 lb weight loss from 12/2013 to 01/20/2015 associated with decreased appetite and early satiety. Extensive work up by GI with EGD, CT, US abdomen and gastric emptying study noted to be unremarkable in terms of causes of weight loss. Potentially secondary to sardoidosis, and decreased PO intake. CT Ab did show hepatosplenomegaly with possible gastric varices.  - Nutrition consult- follow recs  Elevated Alk phos- Alk phos 674, elevated since 09/2014. GHTT on 01/15/2015 elevated to 372, remarkable for intrahepatic cause of elevation. Normal bilirubin and liver transaminases, recent and [place] negative for liver or gall bladder pathology. Negative  Hep B serologies 12/24/2014, pending Hep C serologies but largely normal LFTS makes  viral heptatitis etiology less likely. Concerning for sarcoid involvement to liver, vs other intra hepatic cause  - consider GI consult vs outpt f/u  Hepato-splenomegally- persistent since being noted on 10/25/2014, now with concern for gastric varices and nodular liver edge concerning for cirrhosis. ? Possible amyloidosis given elevated ALP, previously elevated T protein even with low albumin - consider GI consult vs outpt f/u - check SPEP/UPEP - CRP 13.2, ESR 96 - Iron studies: ferritin 932 c/w chronic inflammation  - GI recently placed future orders for ANA, IgG, Mitochondrial & Smooth muscle Antibodies; consider obtaining while inpatient  FEN/GI: Carb/heart, SLIV Prophylaxis: Lovenox  Disposition: home pending clinical improvement and cardiology eval  Subjective:  Feeling ok this am, NAEON, no improvement in weakness, appetite or cough, breathing comfortably  Objective: Temp:  [97.6 F (36.4 C)-100.7 F (38.2 C)] 98.6 F (37 C) (09/04 0426) Pulse Rate:  [82-111] 111 (09/04 0426) Resp:  [17-32] 19 (09/04 0426) BP: (105-151)/(64-93) 151/85 mmHg (09/04 0426) SpO2:  [95 %-100 %] 97 % (09/04 0426) Weight:  [132 lb (59.875 kg)] 132 lb (59.875 kg) (09/03 2024) Physical Exam: General: NAD, lying in bed HEENT: EOMI, MMM Cardiovascular: RRR, no murmurs Respiratory: CTAB, normal work of breathing Abdomen: soft, diffusely tender to palpation no rebound mild voluntary guarding, difficult to assess organomegally secondary to pt tolerance of exam MSK: Tenderness to palpation of left costal margin Skin: no rashes or lesion Neuro: No focal deficits Psych: normal mood and affect  Laboratory:  Recent Labs Lab 01/19/15 2059  WBC 7.7  HGB 11.4*  HCT 34.7*  PLT 326    Recent Labs Lab 01/15/15 0950 01/19/15 2059  NA  --  128*  K  --  3.2*  CL  --  89*  CO2  --  26  BUN  --  <5*  CREATININE  --  0.69  CALCIUM  --  8.3*  PROT 9.6* 7.8  BILITOT 0.6 0.4  ALKPHOS 862* 674*  ALT  11 12*  AST 23 44*  GLUCOSE  --  158*   Lactic acid 5.37->4.37->4.1 (chronically elevated) ESR 96 CRP 13.2  Imaging/Diagnostic Tests: 9/3 CT abd/pelvis  1. Diffuse tiny nodular opacities within the right lower lung lobe and right middle lung lobe raise suspicion for atypical pneumonia. This is new from the prior study. 2. Splenomegaly and hepatomegaly again noted. 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 Apparent gastric varices suggested, not well seen without contrast.  9/3 CXR No active cardiopulmonary disease.  Frazier Richards, MD 01/20/2015, 8:17 AM PGY-3, [place] Intern pager: [phone], text pages welcome

## 2015-01-20 NOTE — Progress Notes (Signed)
S: Called to eval patient after 19 beat run of vtach. Patient reports no recollection of the event. Husband witnessed and reports she was confused and clutching her chest as though it hurt.  O:  Filed Vitals:   01/20/15 1441  BP: 133/77  Pulse: 92  Temp: 98.4 F (36.9 C)  Resp: 18   Tele: 19 beats of ventricular tachycardia   A/P: Suspect this is related to stress of acute illness superimposed on tachyarrhythmia history. If continues consider restarting antibiotics but doubt infectious cause at this point. RN to follow-up with lab regarding D-dimer.

## 2015-01-21 ENCOUNTER — Encounter (HOSPITAL_COMMUNITY): Payer: Self-pay | Admitting: Radiology

## 2015-01-21 ENCOUNTER — Inpatient Hospital Stay (HOSPITAL_COMMUNITY): Payer: Medicaid Other

## 2015-01-21 DIAGNOSIS — I472 Ventricular tachycardia: Secondary | ICD-10-CM

## 2015-01-21 LAB — CBC
HEMATOCRIT: 34 % — AB (ref 36.0–46.0)
Hemoglobin: 10.9 g/dL — ABNORMAL LOW (ref 12.0–15.0)
MCH: 28.1 pg (ref 26.0–34.0)
MCHC: 32.1 g/dL (ref 30.0–36.0)
MCV: 87.6 fL (ref 78.0–100.0)
Platelets: 345 10*3/uL (ref 150–400)
RBC: 3.88 MIL/uL (ref 3.87–5.11)
RDW: 13.8 % (ref 11.5–15.5)
WBC: 7.1 10*3/uL (ref 4.0–10.5)

## 2015-01-21 LAB — BASIC METABOLIC PANEL
Anion gap: 8 (ref 5–15)
BUN: <5 mg/dL — ABNORMAL LOW (ref 6–20)
CALCIUM: 8.6 mg/dL — AB (ref 8.9–10.3)
CO2: 28 mmol/L (ref 22–32)
CREATININE: 0.41 mg/dL — AB (ref 0.44–1.00)
Chloride: 96 mmol/L — ABNORMAL LOW (ref 101–111)
GFR calc non Af Amer: >60 mL/min (ref >60)
Glucose, Bld: 115 mg/dL — ABNORMAL HIGH (ref 65–99)
Potassium: 3.4 mmol/L — ABNORMAL LOW (ref 3.5–5.1)
Sodium: 132 mmol/L — ABNORMAL LOW (ref 135–145)

## 2015-01-21 LAB — URINE CULTURE: CULTURE: NO GROWTH

## 2015-01-21 LAB — GLUCOSE, CAPILLARY
GLUCOSE-CAPILLARY: 102 mg/dL — AB (ref 65–99)
Glucose-Capillary: 169 mg/dL — ABNORMAL HIGH (ref 65–99)
Glucose-Capillary: 97 mg/dL (ref 65–99)
Glucose-Capillary: 112 mg/dL — ABNORMAL HIGH (ref 65–99)

## 2015-01-21 MED ORDER — BENZONATATE 100 MG PO CAPS
100.0000 mg | ORAL_CAPSULE | Freq: Two times a day (BID) | ORAL | Status: DC | PRN
  Administered 2015-01-21 – 2015-01-27 (×9): 100 mg via ORAL
  Filled 2015-01-21 (×10): qty 1

## 2015-01-21 MED ORDER — ALBUTEROL SULFATE (2.5 MG/3ML) 0.083% IN NEBU: 3.0000 mL | INHALATION_SOLUTION | Freq: Four times a day (QID) | RESPIRATORY_TRACT | Status: DC | PRN

## 2015-01-21 MED ORDER — POTASSIUM CHLORIDE CRYS ER 20 MEQ PO TBCR: 40.0000 meq | EXTENDED_RELEASE_TABLET | Freq: Once | ORAL | Status: DC

## 2015-01-21 MED ORDER — ENSURE ENLIVE PO LIQD
237.0000 mL | Freq: Three times a day (TID) | ORAL | Status: DC
  Administered 2015-01-22 – 2015-01-28 (×17): 237 mL via ORAL

## 2015-01-21 MED ORDER — POTASSIUM CHLORIDE CRYS ER 20 MEQ PO TBCR
40.0000 meq | EXTENDED_RELEASE_TABLET | Freq: Once | ORAL | Status: AC
  Administered 2015-01-21: 40 meq via ORAL

## 2015-01-21 NOTE — Progress Notes (Signed)
Family Medicine Teaching Service Daily Progress Note Intern Pager: [phone]  Patient name: [name] Medical record number: [id number] Date of birth: 1971-01-02 Age: 44 y.o. Gender: female  Primary Care Provider: Cordelia Poche, MD Consultants: cards   Code Status: full  Pt Overview and Major Events to Date:  9/3 - admitted with prolonged palpitations and low grade fever 9/5 - CT chest spectrum of findings related to sarcoidosis and dilation of pulmonic trunk likely related to [facility]   Assessment and Plan: [name] is a 44 y.o. female presenting with sensation of racing heart and fever today to 102 . PMH is significant for Long QT, history of torsades, AICD in place, DM2, Sarcoidosis  SIRS / Lactic acidosis- On admission pt was febrile to 100.7, HR 102 with lactate to 5.37 which trended to 4.1. On CT abd pelvis, small focal lesions seen in lower lung fields concerning for atypical pneumonia per radiology read. However, clinically pt endorses stable cough with no increased sputum production, WBC 7.7 on presentation, and with concern for chronically elevated lactate as it has been elevated to 3.7 at [facility], and elevated to 2.09 on 11/22/2014 here, making infectious cause of symptoms less likely. Must also consider alternate causes of presentation, including PE, vs prolonged tachyarhythmia with decreased tissue perfusion, worsenig Pulmonary sarcoidosis. Wells score 4.5 for vitals signs consistent with PE and, tachycardia. She additionally had recent surgery (TAH/BSO) on 11/21/2014 further increasing her clot risk. However, given recent hospitalization with new pulmonary nodules must consider HCAP.  - stop Vanc/Zosyn, monitor 24hr off abx (suspect sarcoid is cause of nodules and chronic inflammation with low grade fever), will restart if becomes febrile >101; patient remained afebrile since d/c of abx  - follow BCx, UCx; NGTD x <24 hrs - trend CBC; WBC 7.1 (9/5) - D dimer elevated to  3.79; CT chest without contrast obtained: spectrum of findings likely related to sarcoidosis and dilation of pulmonic trunk suggestive of pulmonary arterial HTN   -Echo (June '16 at [facility]): LVEF >70%    Cardiac: prolonged QTc to 545 on presentation, with significant history of prolonged Qtc, torsades de pointes, SVT with subsequent AICD placement. Last echo 6/13 at [facility] LV EF => 70%, borderline left ventricular hypertrophy, else largely normal. Lack of clinical signs of heart failure reassuring. Pt has been noted to have daily dyspnea of unclear etiology ( 7/25) so was sterted on Lasix 20 qD by cardiology. Present presentation with palpitations and continued dyspnea concerning for repeat tachyarythmia.  - Cariology consulted>> do not feel primarily a cardiac issue; d/c lasix; replete electrolytes and watch on tele; ok to d/c tomorrow if no VT today  - Pursue AICD interrogation - Telemetry  - trend troponin; all negative   Hypokalemia- K 3.4 - K continue home K supplements -40 meq Kdur today  - Mag 1.8  Sarcoid/Obstructive chronic bronchitis- Stable per last Pulmonology assessment on 01/07/2015, current presentation concerning for worsening disease status - ESR 96, CRP 13.2 (unclear whether this represents a significant change from her baseline chronic inflammatory condition) - Consider Pulmonology consult vs outpt f/u - continue home Qvar  DM2- Last A1c 6.3, 12/24/2014, stable - Lantus 35u qd - SSI - CBG ACHS  Weight loss/Anorexia- Approximately 55 lb weight loss from 12/2013 to 01/20/2015 associated with decreased appetite and early satiety. Extensive work up by GI with EGD, CT, US abdomen and gastric emptying study noted to be unremarkable in terms of causes of weight loss. Potentially secondary to sardoidosis,  and decreased PO intake. CT Ab did show hepatosplenomegaly with possible gastric varices.  - Nutrition consult- follow recs  Elevated Alk phos- Alk phos 674, elevated  since 09/2014. GHTT on 01/15/2015 elevated to 372, remarkable for intrahepatic cause of elevation. Normal bilirubin and liver transaminases, recent and [place] negative for liver or gall bladder pathology. Negative Hep B serologies 12/24/2014, pending Hep C serologies but largely normal LFTS makes viral heptatitis etiology less likely. Concerning for sarcoid involvement to liver, vs other intra hepatic cause  - consider GI consult vs outpt f/u  Hepato-splenomegally- persistent since being noted on 10/25/2014, now with concern for gastric varices and nodular liver edge concerning for cirrhosis. ? Possible amyloidosis given elevated ALP, previously elevated T protein even with low albumin - consider GI consult vs outpt f/u - check SPEP/UPEP - CRP 13.2, ESR 96 - Iron studies: ferritin 932 c/w chronic inflammation  - GI recently placed future orders for ANA, IgG, Mitochondrial & Smooth muscle Antibodies; consider obtaining while inpatient  FEN/GI: Carb/heart, SLIV Prophylaxis: Lovenox  Disposition: home pending clinical improvement and cardiology eval  Subjective:  Denies palpitations and chest pain. Was mildly SOB but improved with recent breathing treatment. States that her chronic cough bothers her.   Objective: Temp:  [98.4 F (36.9 C)-98.8 F (37.1 C)] 98.8 F (37.1 C) (09/05 0424) Pulse Rate:  [71-107] 71 (09/05 0424) Resp:  [18] 18 (09/05 0424) BP: (115-133)/(72-84) 127/73 mmHg (09/05 0424) SpO2:  [97 %-100 %] 97 % (09/05 0424) Physical Exam: General: NAD, lying in bed HEENT: EOMI, MMM Cardiovascular: RRR, no murmurs Respiratory: CTAB, normal work of breathing Abdomen: soft, diffusely tender to palpation no rebound mild voluntary guarding, difficult to assess organomegally secondary to pt tolerance of exam MSK: Tenderness to palpation of left costal margin Skin: no rashes or lesion Neuro: No focal deficits Psych: normal mood and affect  Laboratory:  Recent Labs Lab 01/19/15 2059  01/20/15 0935 01/21/15 0400  WBC 7.7 7.5 7.1  HGB 11.4* 10.9* 10.9*  HCT 34.7* 33.7* 34.0*  PLT 326 331 345    Recent Labs Lab 01/15/15 0950 01/19/15 2059 01/20/15 0935 01/21/15 0400  NA  --  128* 130* 132*  K  --  3.2* 3.8 3.4*  CL  --  89* 96* 96*  CO2  --  26 27 28   BUN  --  <5* <5* <5*  CREATININE  --  0.69 0.55 0.41*  CALCIUM  --  8.3* 8.1* 8.6*  PROT 9.6* 7.8 7.7  --   BILITOT 0.6 0.4 0.5  --   ALKPHOS 862* 674* 609*  --   ALT 11 12* 12*  --   AST 23 44* 35  --   GLUCOSE  --  158* 131* 115*   Lactic acid 5.37->4.37->4.1 (chronically elevated) ESR 96 CRP 13.2  Imaging/Diagnostic Tests: 9/3 CT abd/pelvis  1. Diffuse tiny nodular opacities within the right lower lung lobe and right middle lung lobe raise suspicion for atypical pneumonia. This is new from the prior study. 2. Splenomegaly and hepatomegaly again noted. 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 Apparent gastric varices suggested, not well seen without contrast.  9/3 CXR No active cardiopulmonary disease.  Nicolette Bang, DO 01/21/2015, 8:10 AM PGY-3, [place] Intern pager: [phone], text pages welcome

## 2015-01-21 NOTE — Progress Notes (Addendum)
Patient ID: CELICA KOTOWSKI, female   DOB: [birth year], 44 y.o.   MRN: [id number]    Patient Name: [name] Date of Encounter: 01/21/2015     Active Problems:   Diabetes mellitus, type 2   Essential hypertension   SYNDROME, LONG QT   PSVT   OBSTRUCTIVE CHRONIC BRONCHITIS   Palpitations   Automatic implantable cardioverter-defibrillator in situ   Elevated alkaline phosphatase level   Hypokalemia   HCAP (healthcare-associated pneumonia)    SUBJECTIVE  No chest pain or sob. Minimal fever  CURRENT MEDS . budesonide (PULMICORT) nebulizer solution  0.25 mg Nebulization BID  . cycloSPORINE  1 drop Both Eyes BID  . enoxaparin (LOVENOX) injection  40 mg Subcutaneous Daily  . insulin aspart  0-9 Units Subcutaneous TID WC  . insulin glargine  35 Units Subcutaneous QHS  . Linaclotide  145 mcg Oral Daily  . olopatadine  1 drop Both Eyes BID  . pantoprazole  40 mg Oral Q0600  . potassium chloride SA  20 mEq Oral Daily  . potassium chloride  40 mEq Oral Once  . propranolol  80 mg Oral BID  . sodium chloride  3 mL Intravenous Q12H    OBJECTIVE  Filed Vitals:   01/20/15 2009 01/20/15 2203 01/21/15 0424 01/21/15 0829  BP:  115/72 127/73   Pulse:  79 71 75  Temp:   98.8 F (37.1 C)   TempSrc:   Oral   Resp:   18 18  Height:      Weight:      SpO2: 98%  97% 96%    Intake/Output Summary (Last 24 hours) at 01/21/15 0831 Last data filed at 01/20/15 2152  Gross per 24 hour  Intake    240 ml  Output      0 ml  Net    240 ml   Filed Weights   01/19/15 2024  Weight: 132 lb (59.875 kg)    PHYSICAL EXAM  General: Pleasant, NAD. Neuro: Alert and oriented X 3. Moves all extremities spontaneously. Psych: blunted affect. HEENT:  Normal  Neck: Supple without bruits or JVD. Lungs:  Resp regular and unlabored, CTA. Heart: RRR no s3, s4, or murmurs. Abdomen: Soft, non-tender, non-distended, BS + x 4.  Extremities: No clubbing, cyanosis or edema. DP/PT/Radials 2+ and equal  bilaterally.  Accessory Clinical Findings  CBC  Recent Labs  01/19/15 2059 01/20/15 0935 01/21/15 0400  WBC 7.7 7.5 7.1  NEUTROABS 4.4  --   --   HGB 11.4* 10.9* 10.9*  HCT 34.7* 33.7* 34.0*  MCV 85.9 86.6 87.6  PLT 326 331 277   Basic Metabolic Panel  Recent Labs  01/20/15 0335 01/20/15 0935 01/21/15 0400  NA  --  130* 132*  K  --  3.8 3.4*  CL  --  96* 96*  CO2  --  27 28  GLUCOSE  --  131* 115*  BUN  --  <5* <5*  CREATININE  --  0.55 0.41*  CALCIUM  --  8.1* 8.6*  MG 1.8  --   --    Liver Function Tests  Recent Labs  01/19/15 2059 01/20/15 0935  AST 44* 35  ALT 12* 12*  ALKPHOS 674* 609*  BILITOT 0.4 0.5  PROT 7.8 7.7  ALBUMIN 1.9* 1.8*    Recent Labs  01/19/15 2059  LIPASE 44   Cardiac Enzymes  Recent Labs  01/20/15 0335 01/20/15 0935 01/20/15 1537  TROPONINI <0.03 <0.03 <0.03   BNP Invalid  input(s): POCBNP D-Dimer  Recent Labs  01/20/15 1537  DDIMER 3.79*   Hemoglobin A1C No results for input(s): HGBA1C in the last 72 hours. Fasting Lipid Panel No results for input(s): CHOL, HDL, LDLCALC, TRIG, CHOLHDL, LDLDIRECT in the last 72 hours. Thyroid Function Tests No results for input(s): TSH, T4TOTAL, T3FREE, THYROIDAB in the last 72 hours.  Invalid input(s): FREET3  TELE  nsr with runs of PMVT due to TdP due to long QT  Radiology/Studies  Ct Abdomen Pelvis Wo Contrast  01/20/2015   CLINICAL DATA:  Acute onset of epigastric abdominal pain and swelling. Initial encounter.  EXAM: CT ABDOMEN AND PELVIS WITHOUT CONTRAST  TECHNIQUE: Multidetector CT imaging of the abdomen and pelvis was performed following the standard protocol without IV contrast.  COMPARISON:  CT of the abdomen and pelvis performed 10/25/2014, and abdominal ultrasound performed 10/08/2014  FINDINGS: Diffuse tiny nodular opacities within the visualized right lower lobe and right middle lobe raise suspicion for atypical infection. An AICD lead is partially imaged.   Splenomegaly and hepatomegaly are again noted. The spleen measures 14.3 cm in length, while the liver measures 27.9 cm in length. A slightly nodular contour to the liver raises concern for mild hepatic cirrhosis. 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 The vasculature is not well assessed without contrast.  The gallbladder is relatively decompressed and within normal limits. The pancreas and adrenal glands are unremarkable.  The kidneys are unremarkable in appearance. There is no evidence of hydronephrosis. No renal or ureteral stones are seen. No perinephric stranding is appreciated.  No free fluid is identified. The small bowel is unremarkable in appearance. The stomach is within normal limits. No acute vascular abnormalities are seen.  The appendix is normal in caliber and contains air without evidence of appendicitis. The colon is unremarkable in appearance.  The bladder is moderately distended and grossly unremarkable. The patient is status post hysterectomy. No suspicious adnexal masses are seen. No inguinal lymphadenopathy is seen.  No acute osseous abnormalities are identified.  IMPRESSION: 1. Diffuse tiny nodular opacities within the right lower lung lobe and right middle lung lobe raise suspicion for atypical pneumonia. This is new from the prior study. 2. Splenomegaly and hepatomegaly again noted. 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 Apparent gastric varices suggested, not well seen without contrast.   Electronically Signed   By: Garald Balding M.D.   On: 01/20/2015 00:12   Dg Chest 2 View  01/19/2015   CLINICAL DATA:  Cough and fever  EXAM: CHEST  2 VIEW  COMPARISON:  06/29/2014  FINDINGS: AICD remains in good position. Heart size normal. Negative for heart failure. Lungs are clear without infiltrate or effusion.  IMPRESSION: No active cardiopulmonary disease.   Electronically Signed   By: Franchot Gallo M.D.   On: 01/19/2015 21:44   Ct Chest Wo  Contrast  01/21/2015   CLINICAL DATA:  44 year old female with history of tachycardia.  EXAM: CT CHEST WITHOUT CONTRAST  TECHNIQUE: Multidetector CT imaging of the chest was performed following the standard protocol without IV contrast.  COMPARISON:  No priors.  FINDINGS: Mediastinum/Lymph Nodes: Heart size is normal. There is no significant pericardial fluid, thickening or pericardial calcification. Left-sided pacemaker/AICD in position with lead tip terminating in the right ventricular apex. Mild dilatation of the pulmonic trunk (3.6 cm in diameter). Multiple borderline enlarged and mildly enlarged mediastinal lymph nodes, measuring up to 16 mm in the subcarinal nodal station. Esophagus is unremarkable in appearance. No axillary  lymphadenopathy.  Lungs/Pleura: There are innumerable tiny sub cm pulmonary nodules scattered throughout the lungs bilaterally. The majority of these are in the right lung, predominantly in the right lower lobe where they appear to be predominantly peribronchovascular in distribution. Some subpleural nodularity is also noted along the fissures bilaterally. No larger more suspicious appearing pulmonary nodules or masses are otherwise noted. No acute consolidative airspace disease. No pleural effusions.  Upper Abdomen: Spleen is incompletely visualized, but appears enlarged, measuring up to 15.3 x 7.7 cm on axial images. Liver is also incompletely visualized, but appears potentially enlarged.  Musculoskeletal/Soft Tissues: There are no aggressive appearing lytic or blastic lesions noted in the visualized portions of the skeleton.  IMPRESSION: 1. There is a spectrum of findings in the thorax that is most likely related to the patient's history of sarcoidosis. Specifically, there is a perilymphatic distribution of multiple tiny subcentimeter pulmonary nodules, in addition to mediastinal lymphadenopathy. 2. Dilatation of the pulmonic trunk (3.6 cm in diameter), suggestive of pulmonary arterial  hypertension. 3. Splenomegaly. 4. Probable hepatomegaly.   Electronically Signed   By: Vinnie Langton M.D.   On: 01/21/2015 08:09    ASSESSMENT AND PLAN  1. PMVT - will adv beta blocker 2. Long qt 3. Fever/chills/normal wbc - etiology unclear 4. Weight loss, now stabilized - etiology unclear 5. Elevated alk phos - etiology unclear 6. Hypokalemia -  Diuretic has been stopped. Will replete with supp potassium Rec: watch on tele today for more VT. Replete elctrolytes. Interogate ICD tomorrow. Ok to discharge tomorrow if no VT today.   Arlin Savona,M.D.  01/21/2015 8:31 AM

## 2015-01-21 NOTE — Progress Notes (Signed)
Initial Nutrition Assessment  DOCUMENTATION CODES:   Severe malnutrition in context of chronic illness  INTERVENTION:   Ensure Enlive po BID, each supplement provides 350 kcal and 20 grams of protein  Magic cup TID with meals, each supplement provides 290 kcal and 9 grams of protein   NUTRITION DIAGNOSIS:   Malnutrition related to chronic illness as evidenced by severe depletion of body fat, severe depletion of muscle mass, percent weight loss.   GOAL:   Patient will meet greater than or equal to 90% of their needs   MONITOR:   PO intake, Supplement acceptance, Labs, I & O's, Weight trends  REASON FOR ASSESSMENT:   Consult  (malnutrition)  ASSESSMENT:   Pt with hx of sarcoidosis admitted with prolonged palpitations and low grade fever.   Pt with hx of weight loss over the last 6 months, maybe longer. Pt with 21% weight loss x 6 months. Pt does not know why. She has had a lot of GI work up which has been negative. Endorses early satiety, nausea, weakness. Pt feels thirsty and eats a lot of ice.  We discussed importance of consuming foods/beverages high in kcal and protein. Pt agreeable to ensure enlive between meals and magic cups with meals.  Nutrition-Focused physical exam completed. Findings are severe fat depletion, severe muscle depletion, and no edema.     Diet Order:  Diet Carb Modified Fluid consistency:: Thin; Room service appropriate?: Yes  Skin:  Reviewed, no issues  Last BM:  9/3  Height:   Ht Readings from Last 1 Encounters:  01/19/15 5\' 7"  (1.702 m)    Weight:   Wt Readings from Last 1 Encounters:  01/19/15 132 lb (59.875 kg)    Ideal Body Weight:  61.3 kg  BMI:  Body mass index is 20.67 kg/(m^2).  Estimated Nutritional Needs:   Kcal:  1700-1900  Protein:  75-90 grams  Fluid:  > 1.7 L/day  EDUCATION NEEDS:   No education needs identified at this time  [place], [place], [place] Pager [phone] After Hours Pager

## 2015-01-21 NOTE — Discharge Summary (Signed)
[facility] Discharge Summary  Patient name: [name] Medical record number: [id number] Date of birth: 1971/01/24 Age: 44 y.o. Gender: female Date of Admission: 01/19/2015  Date of Discharge: 01/29/2015 Admitting Physician: Kinnie Feil, MD  Primary Care Provider: Cordelia Poche, MD Consultants: Cardiology, GI, ID, Pulmonology  Indication for Hospitalization: Probable Sarcoid Flare   Discharge Diagnoses/Problem List:  Patient Active Problem List   Diagnosis Date Noted  . 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 Fall   . Chronic fatigue 01/01/2015  . Nausea without vomiting 12/11/2014  . Dyspnea 12/10/2014  . Hypokalemia 11/24/2014  . Pressure ulcer 11/23/2014  . Weight loss   . Nausea with vomiting   . Elevated alkaline phosphatase level   . Slow transit constipation   . Torsades de pointes 11/22/2014  . Traumatic hematoma of head 11/22/2014  . Long QT interval 11/06/2014  . Cough 09/03/2011  . INTERNAL HEMORRHOIDS WITHOUT MENTION COMP 02/25/2010  . PSVT 02/24/2010  . PRECORDIAL PAIN 08/22/2009  . Palpitations 10/05/2008  . Automatic implantable cardioverter-defibrillator in situ 10/05/2008  . SEIZURE DISORDER 09/26/2008  . Constipation 02/08/2008  . ABDOMINAL PAIN, PERIUMBILICAL 91/63/8466  . Sarcoidosis, stage 3 03/15/2007  . Diabetes mellitus, type 2 03/15/2007  . Essential hypertension 03/15/2007  . SYNDROME, LONG QT 03/15/2007  . ALLERGIC RHINITIS 03/15/2007  . OBSTRUCTIVE CHRONIC BRONCHITIS 03/15/2007  . GERD 03/15/2007  . ESOPHAGITIS 02/05/2004  . ESOPHAGEAL STRICTURE 02/05/2004    Disposition: Home   Discharge Condition: Stable   Discharge Exam:  General: Thin-appearing female in NAD  HEENT: [place]/AT, EOMI, MMM Cardiovascular: RRR, no murmurs Respiratory: Bibasilar rales but otherwise clear, normal work of breathing Abdomen: soft, mildly distended Skin: No rashes or lesion Neuro: No focal  deficits Psych: normal mood and affect  Brief Hospital Course:  BERMA Hicks is a 44 y.o. female who presented with sensation of racing heart/palpitations and fever to 100.7. Lactic acid level of 5.37 at admission. Patient has chronically elevated lactic acid to 3.7. During her hospital course her lactic acid trended down to 1.2. She admitted to a chronic cough with no increased sputum production. She was given breathing treatments during her hospital course. Was given IV Vancomycin and Zosyn. Abx were discontinued and she was monitored for 48 hours s/p abx. She began having fevers again and Vancomycin, Ceftriaxone, and Doxycycline were started per ID. Patient had an elevated ACE level.   She underwent a bronchoscopy with BAL on 9/8. Unable to obtain biopsies during bronch 2/2 to patient de-sating to 80s. All cytology and cultures obtained were negative. IV Solumedrol was started 9/9 and patient remained afebrile s/p steroids. She was transitioned to PO prednisone 30mg  on 9/12. Antibiotics were discontinued on 9/10.   Due to [facility] Score of 4.5 and elevated D dimer of 3.79, there was some concern for PE. Patient had contrast allergy, so non-contrast CT was ordered. It revealed a spectrum of findings most likely related to patient's known sarcoidosis. It also demonstrated dilation of pulmonic trunk suggestive of pulmonary arterial HTN. Echo performed on 9/12 revealed preserved EF of 60-65% and PA peak pressure of 59mm Hg.    Patient has an extensive cardiac history included h/o of prolonged QTc, torsades de pointes, SVT with subsequent AICD placement. Lasix was discontinued during hospital stay. Patient had hypokalemia to 3.2 and received adequate repletion with K of 4.1 at discharge. Was followed by cardiology during her hospital course. AICD interogated and determined to be  working appropriately.   Issues for Follow Up:  1. Continue Prednisone 30mg  daily until outpatient pulmonology appointment   2. Hypokalemia: Have discontinued patient's home lasix per cardiology and maintained her K supplement. Would recommend repeat BMET. 3. Hepatosplenomegaly: Currently followed by GI as outpatient. CT abdomen concerning for gastric varices and nodular liver edge concerning for cirrhosis. Needs further outpatient GI follow up.  4. Discharged on 30 units lantus daily decreased from home dose of 35 units. Follow up on blood sugars and adjust dose as appropriate.   Significant Procedures: Bronchoscopy   Significant Labs and Imaging:   Recent Labs Lab 01/27/15 0409 01/28/15 0220 01/29/15 0421  WBC 7.3 11.7* 14.6*  HGB 10.5* 10.4* 10.5*  HCT 33.1* 33.6* 33.7*  PLT 377 378 401*    Recent Labs Lab 01/25/15 0332 01/26/15 0317 01/27/15 0409 01/28/15 0220 01/29/15 0421  NA 129* 132* 137 134* 136  K 4.0 3.7 4.4 4.5 4.1  CL 96* 98* 103 102 105  CO2 22 25 25 26 25   GLUCOSE 148* 298* 386* 427* 209*  BUN <5* 7 8 13 8   CREATININE 0.60 0.53 0.53 0.56 0.46  CALCIUM 8.2* 8.7* 9.1 8.6* 8.7*  MG  --   --  2.0  --   --   PHOS  --   --  2.6  --   --      Results/Tests Pending at Time of Discharge: Final Results of Fungus Culture with Smear, AFB culture with smear, Legionella culture  Discharge Medications:    Medication List    STOP taking these medications        atenolol 50 MG tablet  Commonly known as:  TENORMIN      TAKE these medications        acetaminophen 500 MG tablet  Commonly known as:  TYLENOL  Take 1,000 mg by mouth every 6 (six) hours as needed for mild pain or fever.     beclomethasone 80 MCG/ACT inhaler  Commonly known as:  QVAR  Inhale 2 puffs into the lungs 2 (two) times daily.     clonazePAM 0.5 MG tablet  Commonly known as:  KLONOPIN  Take 1 tablet (0.5 mg total) by mouth daily.     cyclobenzaprine 10 MG tablet  Commonly known as:  FLEXERIL  Take 10 mg by mouth 3 (three) times daily as needed for muscle spasms.     cycloSPORINE 0.05 % ophthalmic  emulsion  Commonly known as:  RESTASIS  Place 1 drop into both eyes 2 (two) times daily.     cycloSPORINE 0.05 % ophthalmic emulsion  Commonly known as:  RESTASIS  Place 1 drop into both eyes 2 (two) times daily.     fluticasone 50 MCG/ACT nasal spray  Commonly known as:  FLONASE  Place 2 sprays into both nostrils daily as needed for allergies.     furosemide 20 MG tablet  Commonly known as:  LASIX  Take 1 tablet (20 mg total) by mouth daily.     gabapentin 100 MG capsule  Commonly known as:  NEURONTIN  Take 1 capsule (100 mg total) by mouth 3 (three) times daily.     glimepiride 2 MG tablet  Commonly known as:  AMARYL  Take 2 mg by mouth 2 (two) times daily at 8 am and 10 pm.     LANTUS SOLOSTAR 100 UNIT/ML Solostar Pen  Generic drug:  Insulin Glargine  Inject 30 Units into the skin at bedtime.     Linaclotide  145 MCG Caps capsule  Commonly known as:  LINZESS  Take 1 capsule (145 mcg total) by mouth daily.     meclizine 25 MG tablet  Commonly known as:  ANTIVERT  Take 25 mg by mouth 3 (three) times daily as needed for dizziness (take 1/2 to 1 tablet by mouth as needed for dizziness).     NOVOLOG FLEXPEN 100 UNIT/ML FlexPen  Generic drug:  insulin aspart  Inject 4-8 Units into the skin 3 (three) times daily.     ondansetron 4 MG tablet  Commonly known as:  ZOFRAN  Take 1 tablet (4 mg total) by mouth every 6 (six) hours as needed for nausea or vomiting.     pantoprazole 40 MG tablet  Commonly known as:  PROTONIX  Take 1 tablet (40 mg total) by mouth daily at 6 (six) AM.     PATADAY 0.2 % Soln  Generic drug:  Olopatadine HCl  Place 1 drop into both eyes 2 (two) times daily.     potassium chloride SA 20 MEQ tablet  Commonly known as:  K-DUR,KLOR-CON  Take 1 tablet (20 mEq total) by mouth daily.     predniSONE 10 MG tablet  Commonly known as:  DELTASONE  Take 3 tablets (30 mg total) by mouth daily with breakfast.     propranolol 80 MG tablet  Commonly known  as:  INDERAL  Take 1 tablet (80 mg total) by mouth 2 (two) times daily.        Discharge Instructions: Please refer to Patient Instructions section of EMR for full details.  Patient was counseled important signs and symptoms that should prompt return to medical care, changes in medications, dietary instructions, activity restrictions, and follow up appointments.   Follow-Up Appointments: Follow-up Information    Follow up with PARRETT,TAMMY, NP On 02/11/2015.   Specialty:  Nurse Practitioner   Why:  Appt at 10:30 AM    Contact information:   [address] [address] [phone]       Follow up with Funk. Go on 02/12/2015.   Specialty:  Family Medicine   Why:  For Hospital Followup with Dr. Lonny Prude at 2:45 pm   Contact information:   [address] [id number] [place] [id number] [place], DO 01/29/2015, 12:57 PM PGY-1, [place]

## 2015-01-22 ENCOUNTER — Ambulatory Visit: Payer: Medicaid Other | Admitting: Family Medicine

## 2015-01-22 DIAGNOSIS — D869 Sarcoidosis, unspecified: Secondary | ICD-10-CM

## 2015-01-22 DIAGNOSIS — R162 Hepatomegaly with splenomegaly, not elsewhere classified: Secondary | ICD-10-CM

## 2015-01-22 DIAGNOSIS — E119 Type 2 diabetes mellitus without complications: Secondary | ICD-10-CM

## 2015-01-22 DIAGNOSIS — E1140 Type 2 diabetes mellitus with diabetic neuropathy, unspecified: Secondary | ICD-10-CM

## 2015-01-22 DIAGNOSIS — R509 Fever, unspecified: Secondary | ICD-10-CM

## 2015-01-22 LAB — GLUCOSE, CAPILLARY
GLUCOSE-CAPILLARY: 66 mg/dL (ref 65–99)
GLUCOSE-CAPILLARY: 72 mg/dL (ref 65–99)
Glucose-Capillary: 81 mg/dL (ref 65–99)
Glucose-Capillary: 100 mg/dL — ABNORMAL HIGH (ref 65–99)
Glucose-Capillary: 87 mg/dL (ref 65–99)

## 2015-01-22 LAB — UIFE/LIGHT CHAINS/TP QN, 24-HR UR
% BETA, Urine: 36.9 %
ALBUMIN, U: 10.5 %
ALPHA 1 URINE: 0.6 %
Alpha 2, Urine: 18.1 %
Free Kappa/Lambda Ratio: 10.61 — ABNORMAL HIGH (ref 2.04–10.37)
Free Lambda Lt Chains,Ur: 39.6 mg/L — ABNORMAL HIGH (ref 0.24–6.66)
Free Lt Chn Excr Rate: 420 mg/L — ABNORMAL HIGH (ref 1.35–24.19)
GAMMA GLOBULIN URINE: 33.8 %
TOTAL PROTEIN, URINE-UPE24: 19.9 mg/dL
TOTAL PROTEIN, URINE-UR/DAY: 8 mg/(24.h) — AB (ref 30.0–150.0)

## 2015-01-22 LAB — BASIC METABOLIC PANEL
Anion gap: 8 (ref 5–15)
BUN: 5 mg/dL — ABNORMAL LOW (ref 6–20)
CALCIUM: 9.1 mg/dL (ref 8.9–10.3)
CO2: 28 mmol/L (ref 22–32)
CREATININE: 0.48 mg/dL (ref 0.44–1.00)
Chloride: 101 mmol/L (ref 101–111)
GFR calc Af Amer: >60 mL/min (ref >60)
GFR calc non Af Amer: >60 mL/min (ref >60)
GLUCOSE: 76 mg/dL (ref 65–99)
Potassium: 4 mmol/L (ref 3.5–5.1)
Sodium: 137 mmol/L (ref 135–145)

## 2015-01-22 LAB — CBC
HEMATOCRIT: 35.4 % — AB (ref 36.0–46.0)
Hemoglobin: 11.3 g/dL — ABNORMAL LOW (ref 12.0–15.0)
MCH: 27.6 pg (ref 26.0–34.0)
MCHC: 31.9 g/dL (ref 30.0–36.0)
MCV: 86.6 fL (ref 78.0–100.0)
PLATELETS: 335 10*3/uL (ref 150–400)
RBC: 4.09 MIL/uL (ref 3.87–5.11)
RDW: 13.7 % (ref 11.5–15.5)
WBC: 7.6 10*3/uL (ref 4.0–10.5)

## 2015-01-22 LAB — MAGNESIUM: Magnesium: 1.8 mg/dL (ref 1.7–2.4)

## 2015-01-22 MED ORDER — INSULIN GLARGINE 100 UNIT/ML ~~LOC~~ SOLN
25.0000 [IU] | Freq: Every day | SUBCUTANEOUS | Status: DC
  Administered 2015-01-22: 25 [IU] via SUBCUTANEOUS
  Filled 2015-01-22 (×2): qty 0.25

## 2015-01-22 NOTE — Progress Notes (Signed)
Pt had short episode of torsades, pt asymptomatic, pt denies SOB, dizziness, pain, or feeling any different than before.  RN will continue to monitor, pt call bell within reach and bed in lowest position.  Johnson,Maie Kesinger A 01/22/2015

## 2015-01-22 NOTE — Progress Notes (Addendum)
Patient ID: AYVA VEILLEUX, female   DOB: Apr 24, 1971, 44 y.o.   MRN: [id number]    Patient Name: [name] Date of Encounter: 01/22/2015     Active Problems:   Diabetes mellitus, type 2   Essential hypertension   SYNDROME, LONG QT   PSVT   OBSTRUCTIVE CHRONIC BRONCHITIS   Palpitations   Automatic implantable cardioverter-defibrillator in situ   Elevated alkaline phosphatase level   Hypokalemia   HCAP (healthcare-associated pneumonia)    SUBJECTIVE  C/o cough and fever. No syncope.  CURRENT MEDS . budesonide (PULMICORT) nebulizer solution  0.25 mg Nebulization BID  . cycloSPORINE  1 drop Both Eyes BID  . enoxaparin (LOVENOX) injection  40 mg Subcutaneous Daily  . feeding supplement (ENSURE ENLIVE)  237 mL Oral TID BM  . insulin aspart  0-9 Units Subcutaneous TID WC  . insulin glargine  35 Units Subcutaneous QHS  . Linaclotide  145 mcg Oral Daily  . olopatadine  1 drop Both Eyes BID  . pantoprazole  40 mg Oral Q0600  . potassium chloride SA  20 mEq Oral Daily  . propranolol  80 mg Oral BID  . sodium chloride  3 mL Intravenous Q12H    OBJECTIVE  Filed Vitals:   01/21/15 2114 01/22/15 0122 01/22/15 0300 01/22/15 0429  BP: 109/67   114/77  Pulse: 72   63  Temp: 97.9 F (36.6 C) 102.1 F (38.9 C) 99.1 F (37.3 C) 97.6 F (36.4 C)  TempSrc: Oral Oral  Oral  Resp: 17   17  Height:      Weight:      SpO2: 100%   99%    Intake/Output Summary (Last 24 hours) at 01/22/15 0809 Last data filed at 01/22/15 0125  Gross per 24 hour  Intake    720 ml  Output      0 ml  Net    720 ml   Filed Weights   01/19/15 2024  Weight: 132 lb (59.875 kg)    PHYSICAL EXAM  General: Pleasant, NAD. Neuro: Alert and oriented X 3. Moves all extremities spontaneously. Psych: Normal affect. HEENT:  Normal  Neck: Supple without bruits or JVD. Lungs:  Resp regular and unlabored, CTA. Heart: RRR no s3, s4, or murmurs. Abdomen: Soft, non-tender, non-distended, BS + x 4.    Extremities: No clubbing, cyanosis or edema. DP/PT/Radials 2+ and equal bilaterally.  Accessory Clinical Findings  CBC  Recent Labs  01/19/15 2059  01/21/15 0400 01/22/15 0420  WBC 7.7  < > 7.1 7.6  NEUTROABS 4.4  --   --   --   HGB 11.4*  < > 10.9* 11.3*  HCT 34.7*  < > 34.0* 35.4*  MCV 85.9  < > 87.6 86.6  PLT 326  < > 345 335  < > = values in this interval not displayed. Basic Metabolic Panel  Recent Labs  01/20/15 0335  01/21/15 0400 01/22/15 0420  NA  --   < > 132* 137  K  --   < > 3.4* 4.0  CL  --   < > 96* 101  CO2  --   < > 28 28  GLUCOSE  --   < > 115* 76  BUN  --   < > <5* 5*  CREATININE  --   < > 0.41* 0.48  CALCIUM  --   < > 8.6* 9.1  MG 1.8  --   --   --   < > =  values in this interval not displayed. Liver Function Tests  Recent Labs  01/19/15 2059 01/20/15 0935  AST 44* 35  ALT 12* 12*  ALKPHOS 674* 609*  BILITOT 0.4 0.5  PROT 7.8 7.7  ALBUMIN 1.9* 1.8*    Recent Labs  01/19/15 2059  LIPASE 44   Cardiac Enzymes  Recent Labs  01/20/15 0335 01/20/15 0935 01/20/15 1537  TROPONINI <0.03 <0.03 <0.03   BNP Invalid input(s): POCBNP D-Dimer  Recent Labs  01/20/15 1537  DDIMER 3.79*   Hemoglobin A1C No results for input(s): HGBA1C in the last 72 hours. Fasting Lipid Panel No results for input(s): CHOL, HDL, LDLCALC, TRIG, CHOLHDL, LDLDIRECT in the last 72 hours. Thyroid Function Tests No results for input(s): TSH, T4TOTAL, T3FREE, THYROIDAB in the last 72 hours.  Invalid input(s): FREET3  TELE  NSR  Radiology/Studies  Ct Abdomen Pelvis Wo Contrast  01/20/2015   CLINICAL DATA:  Acute onset of epigastric abdominal pain and swelling. Initial encounter.  EXAM: CT ABDOMEN AND PELVIS WITHOUT CONTRAST  TECHNIQUE: Multidetector CT imaging of the abdomen and pelvis was performed following the standard protocol without IV contrast.  COMPARISON:  CT of the abdomen and pelvis performed 10/25/2014, and abdominal ultrasound performed  10/08/2014  FINDINGS: Diffuse tiny nodular opacities within the visualized right lower lobe and right middle lobe raise suspicion for atypical infection. An AICD lead is partially imaged.  Splenomegaly and hepatomegaly are again noted. The spleen measures 14.3 cm in length, while the liver measures 27.9 cm in length. A slightly nodular contour to the liver raises concern for mild hepatic cirrhosis. 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 The vasculature is not well assessed without contrast.  The gallbladder is relatively decompressed and within normal limits. The pancreas and adrenal glands are unremarkable.  The kidneys are unremarkable in appearance. There is no evidence of hydronephrosis. No renal or ureteral stones are seen. No perinephric stranding is appreciated.  No free fluid is identified. The small bowel is unremarkable in appearance. The stomach is within normal limits. No acute vascular abnormalities are seen.  The appendix is normal in caliber and contains air without evidence of appendicitis. The colon is unremarkable in appearance.  The bladder is moderately distended and grossly unremarkable. The patient is status post hysterectomy. No suspicious adnexal masses are seen. No inguinal lymphadenopathy is seen.  No acute osseous abnormalities are identified.  IMPRESSION: 1. Diffuse tiny nodular opacities within the right lower lung lobe and right middle lung lobe raise suspicion for atypical pneumonia. This is new from the prior study. 2. Splenomegaly and hepatomegaly again noted. 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 Apparent gastric varices suggested, not well seen without contrast.   Electronically Signed   By: Garald Balding M.D.   On: 01/20/2015 00:12   Dg Chest 2 View  01/19/2015   CLINICAL DATA:  Cough and fever  EXAM: CHEST  2 VIEW  COMPARISON:  06/29/2014  FINDINGS: AICD remains in good position. Heart size normal. Negative for heart failure. Lungs  are clear without infiltrate or effusion.  IMPRESSION: No active cardiopulmonary disease.   Electronically Signed   By: Franchot Gallo M.D.   On: 01/19/2015 21:44   Ct Chest Wo Contrast  01/21/2015   CLINICAL DATA:  44 year old female with history of tachycardia.  EXAM: CT CHEST WITHOUT CONTRAST  TECHNIQUE: Multidetector CT imaging of the chest was performed following the standard protocol without IV contrast.  COMPARISON:  No priors.  FINDINGS: Mediastinum/Lymph  Nodes: Heart size is normal. There is no significant pericardial fluid, thickening or pericardial calcification. Left-sided pacemaker/AICD in position with lead tip terminating in the right ventricular apex. Mild dilatation of the pulmonic trunk (3.6 cm in diameter). Multiple borderline enlarged and mildly enlarged mediastinal lymph nodes, measuring up to 16 mm in the subcarinal nodal station. Esophagus is unremarkable in appearance. No axillary lymphadenopathy.  Lungs/Pleura: There are innumerable tiny sub cm pulmonary nodules scattered throughout the lungs bilaterally. The majority of these are in the right lung, predominantly in the right lower lobe where they appear to be predominantly peribronchovascular in distribution. Some subpleural nodularity is also noted along the fissures bilaterally. No larger more suspicious appearing pulmonary nodules or masses are otherwise noted. No acute consolidative airspace disease. No pleural effusions.  Upper Abdomen: Spleen is incompletely visualized, but appears enlarged, measuring up to 15.3 x 7.7 cm on axial images. Liver is also incompletely visualized, but appears potentially enlarged.  Musculoskeletal/Soft Tissues: There are no aggressive appearing lytic or blastic lesions noted in the visualized portions of the skeleton.  IMPRESSION: 1. There is a spectrum of findings in the thorax that is most likely related to the patient's history of sarcoidosis. Specifically, there is a perilymphatic distribution of  multiple tiny subcentimeter pulmonary nodules, in addition to mediastinal lymphadenopathy. 2. Dilatation of the pulmonic trunk (3.6 cm in diameter), suggestive of pulmonary arterial hypertension. 3. Splenomegaly. 4. Probable hepatomegaly.   Electronically Signed   By: Vinnie Langton M.D.   On: 01/21/2015 08:09   ICD interogation - appropriate ICD shock for VF on 9/4   ASSESSMENT AND PLAN  1. TdP - no sustained arrhythmias with initiation of propranolol 2. Long QT - she will continue her beta blocker 3. Fever with abnormal CT - I'll defer decision and type of treatment to medical team. I am concerned about using QT prolonging drugs however.  4. ICD - her device has been interogated and is working normally. Interrogation demonstrates appropriate ICD shocks on 9/4 prior to initiation of propranolol. 5. Hypokalemia - repleted  Gregg Taylor,M.D.  01/22/2015 8:09 AM

## 2015-01-22 NOTE — Progress Notes (Signed)
SUBJECTIVE: The patient denies chest pain, shortness of breath. She has persistent abdominal tenderness. Some chills and fever last night.  No palpitations.   CURRENT MEDICATIONS: . budesonide (PULMICORT) nebulizer solution  0.25 mg Nebulization BID  . cycloSPORINE  1 drop Both Eyes BID  . enoxaparin (LOVENOX) injection  40 mg Subcutaneous Daily  . feeding supplement (ENSURE ENLIVE)  237 mL Oral TID BM  . insulin aspart  0-9 Units Subcutaneous TID WC  . insulin glargine  35 Units Subcutaneous QHS  . Linaclotide  145 mcg Oral Daily  . olopatadine  1 drop Both Eyes BID  . pantoprazole  40 mg Oral Q0600  . potassium chloride SA  20 mEq Oral Daily  . propranolol  80 mg Oral BID  . sodium chloride  3 mL Intravenous Q12H   . sodium chloride 1,000 mL (01/21/15 0841)    OBJECTIVE: Physical Exam: Filed Vitals:   01/21/15 2114 01/22/15 0122 01/22/15 0300 01/22/15 0429  BP: 109/67   114/77  Pulse: 72   63  Temp: 97.9 F (36.6 C) 102.1 F (38.9 C) 99.1 F (37.3 C) 97.6 F (36.4 C)  TempSrc: Oral Oral  Oral  Resp: 17   17  Height:      Weight:      SpO2: 100%   99%    Intake/Output Summary (Last 24 hours) at 01/22/15 5400 Last data filed at 01/22/15 0125  Gross per 24 hour  Intake    720 ml  Output      0 ml  Net    720 ml    Telemetry reveals sinus rhythm with short run of Torsades last night  GEN- The patient is well appearing, alert and oriented x 3 today.   Head- normocephalic, atraumatic Eyes-  Sclera clear, conjunctiva pink Ears- hearing intact Oropharynx- clear Neck- supple  Lungs- Clear to ausculation bilaterally, normal work of breathing Heart- Regular rate and rhythm, no murmurs, rubs or gallops  GI- +diffuse abdominal tenderness, non-distended, + guarding, + BS Extremities- no clubbing, cyanosis, or edema Skin- no rash or lesion Psych- euthymic mood, full affect Neuro- strength and sensation are intact  LABS: Basic Metabolic Panel:  Recent Labs  01/20/15 0335  01/21/15 0400 01/22/15 0420  NA  --   < > 132* 137  K  --   < > 3.4* 4.0  CL  --   < > 96* 101  CO2  --   < > 28 28  GLUCOSE  --   < > 115* 76  BUN  --   < > <5* 5*  CREATININE  --   < > 0.41* 0.48  CALCIUM  --   < > 8.6* 9.1  MG 1.8  --   --   --   < > = values in this interval not displayed. Liver Function Tests:  Recent Labs  01/19/15 2059 01/20/15 0935  AST 44* 35  ALT 12* 12*  ALKPHOS 674* 609*  BILITOT 0.4 0.5  PROT 7.8 7.7  ALBUMIN 1.9* 1.8*    Recent Labs  01/19/15 2059  LIPASE 44   CBC:  Recent Labs  01/19/15 2059  01/21/15 0400 01/22/15 0420  WBC 7.7  < > 7.1 7.6  NEUTROABS 4.4  --   --   --   HGB 11.4*  < > 10.9* 11.3*  HCT 34.7*  < > 34.0* 35.4*  MCV 85.9  < > 87.6 86.6  PLT 326  < > 345 335  < > =  values in this interval not displayed. Cardiac Enzymes:  Recent Labs  01/20/15 0335 01/20/15 0935 01/20/15 1537  TROPONINI <0.03 <0.03 <0.03   D-Dimer:  Recent Labs  01/20/15 1537  DDIMER 3.79*   Anemia Panel:  Recent Labs  01/20/15 0335  FERRITIN 932*  TIBC 176*  IRON 31    RADIOLOGY: Ct Abdomen Pelvis Wo Contrast 01/20/2015   CLINICAL DATA:  Acute onset of epigastric abdominal pain and swelling. Initial encounter.  EXAM: CT ABDOMEN AND PELVIS WITHOUT CONTRAST  TECHNIQUE: Multidetector CT imaging of the abdomen and pelvis was performed following the standard protocol without IV contrast.  COMPARISON:  CT of the abdomen and pelvis performed 10/25/2014, and abdominal ultrasound performed 10/08/2014  FINDINGS: Diffuse tiny nodular opacities within the visualized right lower lobe and right middle lobe raise suspicion for atypical infection. An AICD lead is partially imaged.  Splenomegaly and hepatomegaly are again noted. The spleen measures 14.3 cm in length, while the liver measures 27.9 cm in length. A slightly nodular contour to the liver raises concern for mild hepatic cirrhosis. 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 The  vasculature is not well assessed without contrast.  The gallbladder is relatively decompressed and within normal limits. The pancreas and adrenal glands are unremarkable.  The kidneys are unremarkable in appearance. There is no evidence of hydronephrosis. No renal or ureteral stones are seen. No perinephric stranding is appreciated.  No free fluid is identified. The small bowel is unremarkable in appearance. The stomach is within normal limits. No acute vascular abnormalities are seen.  The appendix is normal in caliber and contains air without evidence of appendicitis. The colon is unremarkable in appearance.  The bladder is moderately distended and grossly unremarkable. The patient is status post hysterectomy. No suspicious adnexal masses are seen. No inguinal lymphadenopathy is seen.  No acute osseous abnormalities are identified.  IMPRESSION: 1. Diffuse tiny nodular opacities within the right lower lung lobe and right middle lung lobe raise suspicion for atypical pneumonia. This is new from the prior study. 2. Splenomegaly and hepatomegaly again noted. 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 Apparent gastric varices suggested, not well seen without contrast.   Electronically Signed   By: Garald Balding M.D.   On: 01/20/2015 00:12   Dg Chest 2 View 01/19/2015   CLINICAL DATA:  Cough and fever  EXAM: CHEST  2 VIEW  COMPARISON:  06/29/2014  FINDINGS: AICD remains in good position. Heart size normal. Negative for heart failure. Lungs are clear without infiltrate or effusion.  IMPRESSION: No active cardiopulmonary disease.   Electronically Signed   By: Franchot Gallo M.D.   On: 01/19/2015 21:44   ASSESSMENT AND PLAN:  Active Problems:   Diabetes mellitus, type 2   Essential hypertension   SYNDROME, LONG QT   PSVT   OBSTRUCTIVE CHRONIC BRONCHITIS   Palpitations   Automatic implantable cardioverter-defibrillator in situ   Elevated alkaline phosphatase level    Hypokalemia   HCAP (healthcare-associated pneumonia)    1.  PMVT/Torsades/Long QT syndrome Episode of short run of Torsades last night Will add Magnesium to labs this morning (K normal today) Continue BB - may need to increase further, will discuss with Dr Lovena Le Will have ICD interrogated this morning  2.  Fever/chills Ongoing evaluation by primary team  3.  Weight loss, now stabilized  Dr Lovena Le to see this morning  Chanetta Marshall, NP 01/22/2015 7:22 AM   EP Attending  Agree with above. I did  not see TdP on tele from yesterday. Will hold off on uptitration of propranolol for now.   Mikle Bosworth.D.

## 2015-01-22 NOTE — Clinical Documentation Improvement (Signed)
Family Medicine Query 1 of 2 To assist with accurate code assignment, please document if the diagnosis of "Sepsis" has been:   - Confirmed this admission, including the infective source.  - Has been ruled out and is not applicable to this admission.  - Unable to clinically determine  Clinical Information: "Sepsis due to unspecified organism" is documented in ED provider note by Leo Grosser, MD at 01/20/2015 3:18 PM and has not been documented since. WBC on admission 7.7 Tachycardic on admission > 100 bpm Temp on admission 100.4 Recurrent fever 102.5 on 01/21/15 at 1430 Recurrent fever 102.1 on 01/22/15 at 0122 Lactic Acid trend this admission - 5.37, 4.37, 4.1  (but also noted to be chronically elevated)   Query 2 of 2 To assist with accurate code assignment, please document if the diagnosis of HCAP has been"    - Confirmed this admission  - Ruled out this admission  - Unable to clinically determine  Clinical information: HCAP (healthcare-associated pneumonia) is documented in the Ed provider noted by Leo Grosser, MD at 01/20/15 3:18 PM Imaging studies this admission -  CXR 01/19/15 CT Abdomen and Pelvis 01/20/15 CT Chest 01/21/15.   Please exercise your independent, professional judgment when responding. A specific answer is not anticipated or expected.   Thank You, Erling Conte  RN BSN CCDS [phone] Health Information Management [place]

## 2015-01-22 NOTE — Progress Notes (Signed)
Family Medicine Teaching Service Daily Progress Note Intern Pager: [phone]  Patient name: [name] Medical record number: [id number] Date of birth: [birth year] Age: 44 y.o. Gender: female  Primary Care Provider: Cordelia Poche, MD Consultants: cards   Code Status: full  Pt Overview and Major Events to Date:  9/3 - admitted with prolonged palpitations and low grade fever 9/5 - CT chest spectrum of findings related to sarcoidosis and dilation of pulmonic trunk likely related to [facility]   Assessment and Plan: [name] is a 44 y.o. female presenting with sensation of racing heart and fever at home to 102 . PMH is significant for Long QT, history of torsades, AICD in place, DM2, Sarcoidosis  SIRS / Lactic acidosis- On admission pt was febrile to 100.7, HR 102 with lactate to 5.37 which trended to 4.1. On CT abd pelvis, small focal lesions seen in lower lung fields concerning for atypical pneumonia per radiology read. However, clinically pt endorses stable cough with no increased sputum production, WBC 7.7 on presentation, and with concern for chronically elevated lactate as it has been elevated to 3.7 at [facility], and elevated to 2.09 on 11/22/2014 here, making infectious cause of symptoms less likely.  - stop Vanc/Zosyn, monitor off abx (suspect sarcoid is cause of nodules and chronic inflammation with low grade fever)>>febrile to 102.1 overnight, now afebrile s/p tylenol  - follow BCx, UCx; NGTD x 2 days - trend CBC; WBC 7.6 (9/6) - D dimer elevated to 3.79; CT chest without contrast obtained: spectrum of findings likely related to sarcoidosis and dilation of pulmonic trunk suggestive of pulmonary arterial HTN   -Echo (June '16 at [facility]): LVEF >70%  -patient continues to have intermittent fever spikes >> ordered repeat BCx and consulted ID    Cardiac: prolonged QTc to 545 on presentation, with significant history of prolonged Qtc, torsades de pointes, SVT with subsequent AICD  placement. Last echo 6/13 at [facility] LV EF => 70%, borderline left ventricular hypertrophy, else largely normal. Lack of clinical signs of heart failure reassuring. Pt has been noted to have daily dyspnea of unclear etiology ( 7/25) so was sterted on Lasix 20 qD by cardiology. Present presentation with palpitations and continued dyspnea concerning for repeat tachyarythmia.  - Cariology consulted>> Continue propanolol, ICD device had been interogated and is working normal  - Telemetry  - trend troponin; all negative   Hypokalemia- K 4.0 (9/6) - K continue home K supplements - Mag 1.8 (9/6)  Sarcoid/Obstructive chronic bronchitis- Stable per last Pulmonology assessment on 01/07/2015, current presentation concerning for worsening disease status - ESR 96, CRP 13.2 (unclear whether this represents a significant change from her baseline chronic inflammatory condition) - Consider Pulmonology consult vs outpt f/u - continue home Qvar  DM2- Last A1c 6.3, 12/24/2014, stable - Lantus 35u qd - SSI - CBG ACHS  Weight loss/Anorexia- Approximately 55 lb weight loss from 12/2013 to 01/20/2015 associated with decreased appetite and early satiety. Extensive work up by GI with EGD, CT, US abdomen and gastric emptying study noted to be unremarkable in terms of causes of weight loss. Potentially secondary to sardoidosis, and decreased PO intake. CT Ab did show hepatosplenomegaly with possible gastric varices.  - Nutrition consult --> Ensure Enlive between meals and magic cup with meals   Elevated Alk phos- Alk phos 674, elevated since 09/2014. GHTT on 01/15/2015 elevated to 372, remarkable for intrahepatic cause of elevation. Normal bilirubin and liver transaminases, recent and [place] negative for liver or gall bladder  pathology. Negative Hep B serologies 12/24/2014, pending Hep C serologies but largely normal LFTS makes viral heptatitis etiology less likely. Concerning for sarcoid involvement to liver, vs other intra  hepatic cause  - consider GI consult vs outpt f/u  Hepato-splenomegally- persistent since being noted on 10/25/2014, now with concern for gastric varices and nodular liver edge concerning for cirrhosis. ? Possible amyloidosis given elevated ALP, previously elevated T protein even with low albumin - consider GI consult vs outpt f/u - SPEP/UPEP pending  - CRP 13.2, ESR 96 - Iron studies: ferritin 932 c/w chronic inflammation  - GI recently placed future orders for ANA, IgG, Mitochondrial & Smooth muscle Antibodies; consider obtaining while inpatient  FEN/GI: Carb/heart, SLIV Prophylaxis: Lovenox  Disposition: home pending clinical improvement and cardiology eval  Subjective:  Short episode of torsades overnight. Febrile overnight. States that her fevers with sarcoidosis are normally lower than these. Patient states she was resting comfortably during the episode. Denies palpitations and chest pain at present. Feels slightly more energized today. Endorses chronic cough with temporary relief with breathing treatments.   Objective: Temp:  [97.6 F (36.4 C)-102.5 F (39.2 C)] 97.6 F (36.4 C) (09/06 0429) Pulse Rate:  [63-92] 63 (09/06 0429) Resp:  [17] 17 (09/06 0429) BP: (109-125)/(66-77) 114/77 mmHg (09/06 0429) SpO2:  [98 %-100 %] 98 % (09/06 9450) Physical Exam: General: NAD, lying in bed HEENT: EOMI, MMM Cardiovascular: RRR, no murmurs Respiratory: CTAB, normal work of breathing Abdomen: soft, diffusely tender to palpation no rebound mild voluntary guarding Skin: no rashes or lesion Neuro: No focal deficits Psych: normal mood and affect  Laboratory:  Recent Labs Lab 01/20/15 0935 01/21/15 0400 01/22/15 0420  WBC 7.5 7.1 7.6  HGB 10.9* 10.9* 11.3*  HCT 33.7* 34.0* 35.4*  PLT 331 345 335    Recent Labs Lab 01/15/15 0950  01/19/15 2059 01/20/15 0935 01/21/15 0400 01/22/15 0420  NA  --   < > 128* 130* 132* 137  K  --   < > 3.2* 3.8 3.4* 4.0  CL  --   < > 89* 96*  96* 101  CO2  --   < > 26 27 28 28   BUN  --   < > <5* <5* <5* 5*  CREATININE  --   < > 0.69 0.55 0.41* 0.48  CALCIUM  --   < > 8.3* 8.1* 8.6* 9.1  PROT 9.6*  --  7.8 7.7  --   --   BILITOT 0.6  --  0.4 0.5  --   --   ALKPHOS 862*  --  674* 609*  --   --   ALT 11  --  12* 12*  --   --   AST 23  --  44* 35  --   --   GLUCOSE  --   < > 158* 131* 115* 76  < > = values in this interval not displayed. Lactic acid 5.37->4.37->4.1 (chronically elevated) ESR 96 CRP 13.2  Imaging/Diagnostic Tests: 9/3 CT abd/pelvis  1. Diffuse tiny nodular opacities within the right lower lung lobe and right middle lung lobe raise suspicion for atypical pneumonia. This is new from the prior study. 2. Splenomegaly and hepatomegaly again noted. 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 Apparent gastric varices suggested, not well seen without contrast.  9/3 CXR No active cardiopulmonary disease.  Nicolette Bang, DO 01/22/2015, 9:09 AM PGY-1, [place] Intern pager: [phone], text pages welcome

## 2015-01-22 NOTE — Progress Notes (Signed)
Text page sent to [facility] Medicine regarding pt low blood sugars today.

## 2015-01-22 NOTE — Progress Notes (Signed)
Quick Note:  She is currently hospitalized with febrile illness - FYI  We had her do labs while you were away ______

## 2015-01-22 NOTE — Progress Notes (Signed)
Hypoglycemic Event  CBG: 66  Treatment: 15 GM carbohydrate snack  Symptoms: None  Follow-up CBG: Time:1159 CBG Result:72  Possible Reasons for Event: Unknown  Comments/MD notified:   Meal tray arrived and patient eating    [name] [place]  Remember to initiate Hypoglycemia Order Set & complete

## 2015-01-22 NOTE — Consult Note (Addendum)
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  Disregard her UCx, her UA is clean.  Check ACE level  Comment- Her syndrome could be tied together as atypical mycobaterial PNA. Her increased alk phos would fit with this as well. However, would be very hesitant to start her on a quinolone or macrolide with CV blessing as both will increase her QT interval.  Cancer, TB, fungus, atypical infection such as nocardia, could also present like this.   Overall, she does not appear toxic.    Thank you so much for this truly interesting consult,   Bobby Rumpf (pager) [phone] [email or web address]  [name] is an 44 y.o. female.  HPI: 44 yo F with hx of DM2, sarcoid, AICD, adm on 9-4 with 48 of cough and palpitations. In ED she was found to have temp of 100.7 and WBC of 7.7. She underwent CT scan of abd/pelvis and was found to have 1. Diffuse tiny nodular opacities within the right lower lung lobe and right middle lung lobe raise suspicion for atypical pneumonia. This is new from the prior study. 2. Splenomegaly and hepatomegaly again noted. 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 Apparent gastric varices suggested, not well seen without contrast. .  She was started on vanco/zosyn for concern for HCAP due to hospitalization on 7-6 for TAHBSO. Over first 24h her temp went to 102. She was noted to have an elevated lactate (which was chronic). Her anbx were stopped and her illness was attributed to her sarcoid (as supported by her chest CT).  Today her fever returned at 102.7.   Past Medical History  Diagnosis Date  . Long Q-T syndrome   .  Palpitation   . Seizure disorder     none recently  . Abdominal pain, periumbilic   . Esophageal stricture 2005/2009    esophageal strictures dilated 2005, 2009  . Sarcoidosis   . Allergic rhinitis   . Bronchitis   . GERD (gastroesophageal reflux disease) 2005  . Asthma   . Fibromyalgia   . Stroke     QUESTIONABLE TIA   . Neuropathy in diabetes   . Seizures     HISTORY OF TAKING KLONIPIN NOT RX AT THE MOMENT  . Cancer     Nodules in lungs pt believes were cancerous pt unsure  . Arthritis   . Internal hemorrhoids 2010    2011 band ligation of int rrhoids.  . Eye hemorrhage     bilateral  . Fibroid, uterine   . Automatic implantable cardioverter-defibrillator in situ 2006, replaced 2008    [employer]  . DM (diabetes mellitus) 1997/1998    type 1, initially gestational but soon after child birth developed IDDM  . History of blood transfusion years ago    Past Surgical History  Procedure Laterality Date  . Tubal ligation    . Hand surgery    . Svt ablation    . Automatic implantable cardiac defibrillator situ  2006,     ICD-Medtronic   Remote - Yes   . Hemorrhoid banding  03/2010  . Dilitation & currettage/hystroscopy with hydrothermal ablation N/A 06/07/2013    Procedure: DILATATION & CURETTAGE/HYSTEROSCOPY WITH attempted HYDROTHERMAL ABLATION;  Surgeon: Marney Doctor  Ruthann Cancer, MD;  Location: [facility];  Service: Gynecology;  Laterality: N/A;  . Esophagogastroduodenoscopy (egd) with propofol N/A 10/24/2014    Procedure: ESOPHAGOGASTRODUODENOSCOPY (EGD) WITH PROPOFOL;  Surgeon: Inda Castle, MD;  Location: [facility];  Service: Endoscopy;  Laterality: N/A;  . Total abdominal hysterectomy w/ bilateral salpingoophorectomy  11/06/14    at [facility] for menorrhagia, pelvic pain and enlarging uterine fibroids  . Exploratory laparotomy  11/06/14    evacuation of post TAH/BSO hematoma  . Abdominal hysterectomy    . Salpingectomy    . Cervix removal       Allergies  Allergen Reactions    . Contrast Media [Iodinated Diagnostic Agents] Anaphylaxis and Shortness Of Breath    Needed to be defibrillated   . Augmentin [Amoxicillin-Pot Clavulanate] Other (See Comments)    "stomach hurt"  . Codeine Nausea And Vomiting    jittery  . Iohexol      Code: RASH, Desc: PT STATES SHE IS ALLERGIC TO IV CONTRAST 05/28/06/RM, Onset Date: [id number]   . Meperidine Hcl Nausea And Vomiting  . Morphine And Related Nausea And Vomiting    Medications:  Scheduled: . budesonide (PULMICORT) nebulizer solution  0.25 mg Nebulization BID  . cycloSPORINE  1 drop Both Eyes BID  . enoxaparin (LOVENOX) injection  40 mg Subcutaneous Daily  . feeding supplement (ENSURE ENLIVE)  237 mL Oral TID BM  . insulin aspart  0-9 Units Subcutaneous TID WC  . insulin glargine  35 Units Subcutaneous QHS  . Linaclotide  145 mcg Oral Daily  . olopatadine  1 drop Both Eyes BID  . pantoprazole  40 mg Oral Q0600  . potassium chloride SA  20 mEq Oral Daily  . propranolol  80 mg Oral BID  . sodium chloride  3 mL Intravenous Q12H    Abtx:  Anti-infectives    Start     Dose/Rate Route Frequency Ordered Stop   01/20/15 0600  piperacillin-tazobactam (ZOSYN) IVPB 3.375 g  Status:  Discontinued     3.375 g 12.5 mL/hr over 240 Minutes Intravenous 3 times per day 01/19/15 2335 01/20/15 0959   01/20/15 0600  vancomycin (VANCOCIN) 500 mg in sodium chloride 0.9 % 100 mL IVPB  Status:  Discontinued     500 mg 100 mL/hr over 60 Minutes Intravenous Every 8 hours 01/19/15 2335 01/20/15 0959   01/19/15 2045  piperacillin-tazobactam (ZOSYN) IVPB 3.375 g     3.375 g 100 mL/hr over 30 Minutes Intravenous  Once 01/19/15 2040 01/19/15 2224   01/19/15 2045  vancomycin (VANCOCIN) IVPB 1000 mg/200 mL premix     1,000 mg 200 mL/hr over 60 Minutes Intravenous  Once 01/19/15 2040 01/19/15 2224      Total days of antibiotics: off          Social History:  reports that she has never smoked. She has never used smokeless tobacco. She  reports that she does not drink alcohol or use illicit drugs.  Family History  Problem Relation Age of Onset  . Cancer    . Diabetes      General ROS: visual changes/diabetic retinoapthy, no dysphagia, decreased apetite, cough with minimal sputum, wt loss over 1 month, fever at home, no Tb exposures, constipation predominant IBS, no dysuria, +neuropathy/fibromyalgia, see HPI. 12 point ROS o/w negative.   Denies LAN.   Blood pressure 132/80, pulse 89, temperature 102.7 F (39.3 C), temperature source Oral, resp. rate 18, height _0  (1.702 m), weight 59.875 kg (132 lb), SpO2 98 %.  General appearance: alert, cooperative and no distress Eyes: negative findings: conjunctivae and sclerae normal and pupils equal, round, reactive to light and accomodation Throat: normal findings: oropharynx pink & moist without lesions or evidence of thrush Neck: no adenopathy and supple, symmetrical, trachea midline Lungs: clear to auscultation bilaterally Heart: regular rate and rhythm Abdomen: normal findings: bowel sounds normal and abnormal findings:  distended and mild-mod tenderness across upper abd. Extremities: edema none Skin: no diabteic foot lesions. subjectively decreased light touch BLE. 3+ dorsalis pedis pulses B.    Results for orders placed or performed during the hospital encounter of 01/19/15 (from the past 48 hour(s))  Troponin I (q 6hr x 3)     Status: None   Collection Time: 01/20/15  3:37 PM  Result Value Ref Range   Troponin I <0.03 <0.031 ng/mL    Comment:        NO INDICATION OF MYOCARDIAL INJURY.   D-dimer, quantitative (not at [facility])     Status: Abnormal   Collection Time: 01/20/15  3:37 PM  Result Value Ref Range   D-Dimer, Quant 3.79 (H) 0.00 - 0.48 ug/mL-FEU    Comment:        AT THE INHOUSE ESTABLISHED CUTOFF VALUE OF 0.48 ug/mL FEU, THIS ASSAY HAS BEEN DOCUMENTED IN THE LITERATURE TO HAVE A SENSITIVITY AND NEGATIVE PREDICTIVE VALUE OF AT LEAST 98 TO 99%.  THE TEST  RESULT SHOULD BE CORRELATED WITH AN ASSESSMENT OF THE CLINICAL PROBABILITY OF DVT / VTE.   Glucose, capillary     Status: Abnormal   Collection Time: 01/20/15  4:36 PM  Result Value Ref Range   Glucose-Capillary 136 (H) 65 - 99 mg/dL  Glucose, capillary     Status: Abnormal   Collection Time: 01/20/15  8:34 PM  Result Value Ref Range   Glucose-Capillary 139 (H) 65 - 99 mg/dL  CBC     Status: Abnormal   Collection Time: 01/21/15  4:00 AM  Result Value Ref Range   WBC 7.1 4.0 - 10.5 K/uL   RBC 3.88 3.87 - 5.11 MIL/uL   Hemoglobin 10.9 (L) 12.0 - 15.0 g/dL   HCT 34.0 (L) 36.0 - 46.0 %   MCV 87.6 78.0 - 100.0 fL   MCH 28.1 26.0 - 34.0 pg   MCHC 32.1 30.0 - 36.0 g/dL   RDW 13.8 11.5 - 15.5 %   Platelets 345 150 - 400 K/uL  Basic metabolic panel     Status: Abnormal   Collection Time: 01/21/15  4:00 AM  Result Value Ref Range   Sodium 132 (L) 135 - 145 mmol/L   Potassium 3.4 (L) 3.5 - 5.1 mmol/L   Chloride 96 (L) 101 - 111 mmol/L   CO2 28 22 - 32 mmol/L   Glucose, Bld 115 (H) 65 - 99 mg/dL   BUN <5 (L) 6 - 20 mg/dL   Creatinine, Ser 0.41 (L) 0.44 - 1.00 mg/dL   Calcium 8.6 (L) 8.9 - 10.3 mg/dL   GFR calc non Af Amer >60 >60 mL/min   GFR calc Af Amer >60 >60 mL/min    Comment: (NOTE) The eGFR has been calculated using the CKD EPI equation. This calculation has not been validated in all clinical situations. eGFR's persistently <60 mL/min signify possible Chronic Kidney Disease.    Anion gap 8 5 - 15  Glucose, capillary     Status: Abnormal   Collection Time: 01/21/15  6:06 AM  Result Value Ref Range   Glucose-Capillary 169 (H) 65 -  99 mg/dL  Glucose, capillary     Status: Abnormal   Collection Time: 01/21/15 11:12 AM  Result Value Ref Range   Glucose-Capillary 102 (H) 65 - 99 mg/dL  Glucose, capillary     Status: None   Collection Time: 01/21/15  4:36 PM  Result Value Ref Range   Glucose-Capillary 97 65 - 99 mg/dL  Glucose, capillary     Status: Abnormal   Collection  Time: 01/21/15  8:56 PM  Result Value Ref Range   Glucose-Capillary 112 (H) 65 - 99 mg/dL  CBC     Status: Abnormal   Collection Time: 01/22/15  4:20 AM  Result Value Ref Range   WBC 7.6 4.0 - 10.5 K/uL   RBC 4.09 3.87 - 5.11 MIL/uL   Hemoglobin 11.3 (L) 12.0 - 15.0 g/dL   HCT 35.4 (L) 36.0 - 46.0 %   MCV 86.6 78.0 - 100.0 fL   MCH 27.6 26.0 - 34.0 pg   MCHC 31.9 30.0 - 36.0 g/dL   RDW 13.7 11.5 - 15.5 %   Platelets 335 150 - 400 K/uL  Basic metabolic panel     Status: Abnormal   Collection Time: 01/22/15  4:20 AM  Result Value Ref Range   Sodium 137 135 - 145 mmol/L   Potassium 4.0 3.5 - 5.1 mmol/L   Chloride 101 101 - 111 mmol/L   CO2 28 22 - 32 mmol/L   Glucose, Bld 76 65 - 99 mg/dL   BUN 5 (L) 6 - 20 mg/dL   Creatinine, Ser 0.48 0.44 - 1.00 mg/dL   Calcium 9.1 8.9 - 10.3 mg/dL   GFR calc non Af Amer >60 >60 mL/min   GFR calc Af Amer >60 >60 mL/min    Comment: (NOTE) The eGFR has been calculated using the CKD EPI equation. This calculation has not been validated in all clinical situations. eGFR's persistently <60 mL/min signify possible Chronic Kidney Disease.    Anion gap 8 5 - 15  Magnesium     Status: None   Collection Time: 01/22/15  4:20 AM  Result Value Ref Range   Magnesium 1.8 1.7 - 2.4 mg/dL  Glucose, capillary     Status: None   Collection Time: 01/22/15  5:33 AM  Result Value Ref Range   Glucose-Capillary 81 65 - 99 mg/dL   Comment 1 Notify RN    Comment 2 Document in Chart   Glucose, capillary     Status: None   Collection Time: 01/22/15 11:30 AM  Result Value Ref Range   Glucose-Capillary 66 65 - 99 mg/dL  Glucose, capillary     Status: None   Collection Time: 01/22/15 11:59 AM  Result Value Ref Range   Glucose-Capillary 72 65 - 99 mg/dL  Culture, blood (routine x 2)     Status: None (Preliminary result)   Collection Time: 01/22/15 12:50 PM  Result Value Ref Range   Specimen Description BLOOD RIGHT ARM    Special Requests BOTTLES DRAWN AEROBIC  ONLY  5CC    Culture PENDING    Report Status PENDING       Component Value Date/Time   SDES BLOOD RIGHT ARM 01/22/2015 1250   SPECREQUEST BOTTLES DRAWN AEROBIC ONLY  5CC 01/22/2015 1250   CULT PENDING 01/22/2015 1250   REPTSTATUS PENDING 01/22/2015 1250   Ct Chest Wo Contrast  01/21/2015   CLINICAL DATA:  44 year old female with history of tachycardia.  EXAM: CT CHEST WITHOUT CONTRAST  TECHNIQUE: Multidetector CT imaging of  the chest was performed following the standard protocol without IV contrast.  COMPARISON:  No priors.  FINDINGS: Mediastinum/Lymph Nodes: Heart size is normal. There is no significant pericardial fluid, thickening or pericardial calcification. Left-sided pacemaker/AICD in position with lead tip terminating in the right ventricular apex. Mild dilatation of the pulmonic trunk (3.6 cm in diameter). Multiple borderline enlarged and mildly enlarged mediastinal lymph nodes, measuring up to 16 mm in the subcarinal nodal station. Esophagus is unremarkable in appearance. No axillary lymphadenopathy.  Lungs/Pleura: There are innumerable tiny sub cm pulmonary nodules scattered throughout the lungs bilaterally. The majority of these are in the right lung, predominantly in the right lower lobe where they appear to be predominantly peribronchovascular in distribution. Some subpleural nodularity is also noted along the fissures bilaterally. No larger more suspicious appearing pulmonary nodules or masses are otherwise noted. No acute consolidative airspace disease. No pleural effusions.  Upper Abdomen: Spleen is incompletely visualized, but appears enlarged, measuring up to 15.3 x 7.7 cm on axial images. Liver is also incompletely visualized, but appears potentially enlarged.  Musculoskeletal/Soft Tissues: There are no aggressive appearing lytic or blastic lesions noted in the visualized portions of the skeleton.  IMPRESSION: 1. There is a spectrum of findings in the thorax that is most likely  related to the patient's history of sarcoidosis. Specifically, there is a perilymphatic distribution of multiple tiny subcentimeter pulmonary nodules, in addition to mediastinal lymphadenopathy. 2. Dilatation of the pulmonic trunk (3.6 cm in diameter), suggestive of pulmonary arterial hypertension. 3. Splenomegaly. 4. Probable hepatomegaly.   Electronically Signed   By: Vinnie Langton M.D.   On: 01/21/2015 08:09   Recent Results (from the past 240 hour(s))  Blood Culture (routine x 2)     Status: None (Preliminary result)   Collection Time: 01/19/15  8:59 PM  Result Value Ref Range Status   Specimen Description BLOOD RIGHT FOREARM  Final   Special Requests BOTTLES DRAWN AEROBIC AND ANAEROBIC 5CC  Final   Culture NO GROWTH 2 DAYS  Final   Report Status PENDING  Incomplete  Blood Culture (routine x 2)     Status: None (Preliminary result)   Collection Time: 01/19/15  9:05 PM  Result Value Ref Range Status   Specimen Description BLOOD RIGHT HAND  Final   Special Requests BOTTLES DRAWN AEROBIC AND ANAEROBIC 5CC  Final   Culture NO GROWTH 2 DAYS  Final   Report Status PENDING  Incomplete  Urine culture     Status: None   Collection Time: 01/19/15 10:02 PM  Result Value Ref Range Status   Specimen Description URINE, CLEAN CATCH  Final   Special Requests NONE  Final   Culture NO GROWTH 2 DAYS  Final   Report Status 01/21/2015 FINAL  Final  Culture, blood (routine x 2)     Status: None (Preliminary result)   Collection Time: 01/22/15 12:50 PM  Result Value Ref Range Status   Specimen Description BLOOD RIGHT ARM  Final   Special Requests BOTTLES DRAWN AEROBIC ONLY  5CC  Final   Culture PENDING  Incomplete   Report Status PENDING  Incomplete      01/22/2015, 2:30 PM     LOS: 2 days    Records and images were personally reviewed where available.

## 2015-01-23 DIAGNOSIS — N189 Chronic kidney disease, unspecified: Secondary | ICD-10-CM

## 2015-01-23 DIAGNOSIS — I4901 Ventricular fibrillation: Secondary | ICD-10-CM

## 2015-01-23 DIAGNOSIS — E1122 Type 2 diabetes mellitus with diabetic chronic kidney disease: Secondary | ICD-10-CM

## 2015-01-23 LAB — APTT: APTT: 45 s — AB (ref 24–37)

## 2015-01-23 LAB — GLUCOSE, CAPILLARY
GLUCOSE-CAPILLARY: 58 mg/dL — AB (ref 65–99)
GLUCOSE-CAPILLARY: 52 mg/dL — AB (ref 65–99)
GLUCOSE-CAPILLARY: 79 mg/dL (ref 65–99)
GLUCOSE-CAPILLARY: 81 mg/dL (ref 65–99)
Glucose-Capillary: 80 mg/dL (ref 65–99)
Glucose-Capillary: 88 mg/dL (ref 65–99)

## 2015-01-23 LAB — CBC
HEMATOCRIT: 32.1 % — AB (ref 36.0–46.0)
Hemoglobin: 10.5 g/dL — ABNORMAL LOW (ref 12.0–15.0)
MCH: 28 pg (ref 26.0–34.0)
MCHC: 32.7 g/dL (ref 30.0–36.0)
MCV: 85.6 fL (ref 78.0–100.0)
PLATELETS: 296 10*3/uL (ref 150–400)
RBC: 3.75 MIL/uL — ABNORMAL LOW (ref 3.87–5.11)
RDW: 13.9 % (ref 11.5–15.5)
WBC: 7.2 10*3/uL (ref 4.0–10.5)

## 2015-01-23 LAB — PROTEIN ELECTROPHORESIS, SERUM
A/G Ratio: 0.4 — ABNORMAL LOW (ref 0.7–1.7)
ALBUMIN ELP: 2.3 g/dL — AB (ref 2.9–4.4)
ALPHA-1-GLOBULIN: 0.4 g/dL (ref 0.0–0.4)
ALPHA-2-GLOBULIN: 0.9 g/dL (ref 0.4–1.0)
BETA GLOBULIN: 1.3 g/dL (ref 0.7–1.3)
GAMMA GLOBULIN: 3.1 g/dL — AB (ref 0.4–1.8)
Globulin, Total: 5.7 g/dL — ABNORMAL HIGH (ref 2.2–3.9)
Total Protein ELP: 8 g/dL (ref 6.0–8.5)

## 2015-01-23 LAB — BASIC METABOLIC PANEL
Anion gap: 8 (ref 5–15)
CHLORIDE: 97 mmol/L — AB (ref 101–111)
CO2: 26 mmol/L (ref 22–32)
CREATININE: 0.42 mg/dL — AB (ref 0.44–1.00)
Calcium: 8.6 mg/dL — ABNORMAL LOW (ref 8.9–10.3)
Glucose, Bld: 78 mg/dL (ref 65–99)
Potassium: 4.5 mmol/L (ref 3.5–5.1)
SODIUM: 131 mmol/L — AB (ref 135–145)

## 2015-01-23 LAB — PROTIME-INR
INR: 1.24 (ref 0.00–1.49)
Prothrombin Time: 15.7 seconds — ABNORMAL HIGH (ref 11.6–15.2)

## 2015-01-23 LAB — ANGIOTENSIN CONVERTING ENZYME: Angiotensin-Converting Enzyme: 177 U/L — ABNORMAL HIGH (ref 14–82)

## 2015-01-23 MED ORDER — LIDOCAINE HCL 2 % EX GEL
1.0000 "application " | Freq: Once | CUTANEOUS | Status: DC
  Filled 2015-01-23: qty 5

## 2015-01-23 MED ORDER — VANCOMYCIN HCL 500 MG IV SOLR
500.0000 mg | Freq: Three times a day (TID) | INTRAVENOUS | Status: DC
  Administered 2015-01-23 – 2015-01-24 (×4): 500 mg via INTRAVENOUS
  Filled 2015-01-23 (×5): qty 500

## 2015-01-23 MED ORDER — PHENYLEPHRINE HCL 0.25 % NA SOLN
1.0000 | Freq: Four times a day (QID) | NASAL | Status: DC | PRN
  Filled 2015-01-23: qty 15

## 2015-01-23 MED ORDER — DEXTROSE 5 % IV SOLN
1.0000 g | INTRAVENOUS | Status: DC
  Administered 2015-01-23 – 2015-01-25 (×3): 1 g via INTRAVENOUS
  Filled 2015-01-23 (×4): qty 10

## 2015-01-23 MED ORDER — BUTAMBEN-TETRACAINE-BENZOCAINE 2-2-14 % EX AERO
1.0000 | INHALATION_SPRAY | Freq: Once | CUTANEOUS | Status: DC
  Filled 2015-01-23: qty 20

## 2015-01-23 MED ORDER — INSULIN GLARGINE 100 UNIT/ML ~~LOC~~ SOLN
20.0000 [IU] | Freq: Every day | SUBCUTANEOUS | Status: DC
  Filled 2015-01-23 (×2): qty 0.2

## 2015-01-23 MED ORDER — VANCOMYCIN HCL IN DEXTROSE 1-5 GM/200ML-% IV SOLN
1000.0000 mg | Freq: Once | INTRAVENOUS | Status: AC
  Administered 2015-01-23: 1000 mg via INTRAVENOUS
  Filled 2015-01-23: qty 200

## 2015-01-23 NOTE — Consult Note (Addendum)
Name: [name] MRN: [id number] DOB: [birth year]    ADMISSION DATE:  01/19/2015 CONSULTATION DATE:  01/22/15  REFERRING MD :  Gwendlyn Deutscher  CHIEF COMPLAINT:  Cough, fevers.  BRIEF PATIENT DESCRIPTION: 44 y.o. , African-American female with history of Stage III sarcoidosis and associated asthmatic bronchitis. Patient of Dr. Lyda Jester. Sarcoidosis not thought to be active and she was in systemic steroids for about a year in past but not since atleast 2008. Maintained on inhaled corticosteroids. She is also history of chest pain which has been evaluated thoroughly by cardiology and long QT syndrome.   SIGNIFICANT EVENTS    STUDIES:  CT chest (01/21/15) IMPRESSION: 1. There is a spectrum of findings in the thorax that is most likely related to the patient's history of sarcoidosis. Specifically, there is a perilymphatic distribution of multiple tiny subcentimeter pulmonary nodules, in addition to mediastinal lymphadenopathy. 2. Dilatation of the pulmonic trunk (3.6 cm in diameter), suggestive of pulmonary arterial hypertension. 3. Splenomegaly. 4. Probable hepatomegaly.   HISTORY OF PRESENT ILLNESS:  Presenting with fever and weakness and mild cough. States that she has cough at baseline. Cough right now is not very different from baseline. She had fevers with elevated lactic acid started on antibiotics for HCAP. She had a CT scan which shows multiple pulmonary nodules indicative of a door her sarcoidosis or an atypical infection. PCCM was consulted for bronchoscope.  PAST MEDICAL HISTORY :   has a past medical history of Long Q-T syndrome; Palpitation; Seizure disorder; Abdominal pain, periumbilic; Esophageal stricture (2005/2009); Sarcoidosis; Allergic rhinitis; Bronchitis; GERD (gastroesophageal reflux disease) (2005); Asthma; Fibromyalgia; Stroke; Neuropathy in diabetes; Seizures; Cancer; Arthritis; Internal hemorrhoids (2010); Eye hemorrhage; Fibroid, uterine; Automatic implantable  cardioverter-defibrillator in situ (2006, replaced 2008); DM (diabetes mellitus) (1997/1998); and History of blood transfusion (years ago).  has past surgical history that includes Tubal ligation; Hand surgery; SVT ablation; AUTOMATIC IMPLANTABLE CARDIAC DEFIBRILLATOR SITU (2006, ); Hemorrhoid banding (03/2010); Dilatation & currettage/hysteroscopy with hydrothermal ablation (N/A, 06/07/2013); Esophagogastroduodenoscopy (egd) with propofol (N/A, 10/24/2014); Total abdominal hysterectomy w/ bilateral salpingoophorectomy (11/06/14); Exploratory laparotomy (11/06/14); Abdominal hysterectomy; Salpingectomy; and Cervix removal. Prior to Admission medications   Medication Sig Start Date End Date Taking? Authorizing Provider  acetaminophen (TYLENOL) 500 MG tablet Take 1,000 mg by mouth every 6 (six) hours as needed for mild pain or fever.   Yes Historical Provider, MD  atenolol (TENORMIN) 50 MG tablet Take 1 tablet (50 mg total) by mouth 2 (two) times daily. 11/26/14  Yes Evans Lance, MD  beclomethasone (QVAR) 80 MCG/ACT inhaler Inhale 2 puffs into the lungs 2 (two) times daily. 09/05/14  Yes Elsie Stain, MD  clonazePAM (KLONOPIN) 0.5 MG tablet Take 1 tablet (0.5 mg total) by mouth daily. 12/24/14  Yes Mariel Aloe, MD  cyclobenzaprine (FLEXERIL) 10 MG tablet Take 10 mg by mouth 3 (three) times daily as needed for muscle spasms.    Yes Historical Provider, MD  cycloSPORINE (RESTASIS) 0.05 % ophthalmic emulsion Place 1 drop into both eyes 2 (two) times daily.    Yes Historical Provider, MD  fluticasone (FLONASE) 50 MCG/ACT nasal spray Place 2 sprays into both nostrils daily as needed for allergies.    Yes Historical Provider, MD  furosemide (LASIX) 20 MG tablet Take 1 tablet (20 mg total) by mouth daily. 12/10/14  Yes Evans Lance, MD  gabapentin (NEURONTIN) 100 MG capsule Take 1 capsule (100 mg total) by mouth 3 (three) times daily. 06/29/14  Yes Tanda Rockers, MD  glimepiride (AMARYL) 2 MG tablet Take 2 mg  by mouth 2 (two) times daily at 8 am and 10 pm.    Yes Historical Provider, MD  LANTUS SOLOSTAR 100 UNIT/ML Solostar Pen Inject 35 Units into the skin at bedtime.   Yes Historical Provider, MD  Linaclotide Rolan Lipa) 145 MCG CAPS capsule Take 1 capsule (145 mcg total) by mouth daily. 12/11/14  Yes Jessica D Zehr, PA-C  meclizine (ANTIVERT) 25 MG tablet Take 25 mg by mouth 3 (three) times daily as needed for dizziness (take 1/2 to 1 tablet by mouth as needed for dizziness).   Yes Historical Provider, MD  NOVOLOG FLEXPEN 100 UNIT/ML FlexPen Inject 4-8 Units into the skin 3 (three) times daily.   Yes Historical Provider, MD  Olopatadine HCl (PATADAY) 0.2 % SOLN Place 1 drop into both eyes 2 (two) times daily.    Yes Historical Provider, MD  ondansetron (ZOFRAN) 4 MG tablet Take 1 tablet (4 mg total) by mouth every 6 (six) hours as needed for nausea or vomiting. 01/15/15  Yes Gatha Mayer, MD  pantoprazole (PROTONIX) 40 MG tablet Take 1 tablet (40 mg total) by mouth daily at 6 (six) AM. 11/24/14  Yes Rhonda G Barrett, PA-C  potassium chloride SA (K-DUR,KLOR-CON) 20 MEQ tablet Take 1 tablet (20 mEq total) by mouth daily. 11/24/14  Yes Rhonda G Barrett, PA-C   Allergies  Allergen Reactions  . Contrast Media [Iodinated Diagnostic Agents] Anaphylaxis and Shortness Of Breath    Needed to be defibrillated   . Augmentin [Amoxicillin-Pot Clavulanate] Other (See Comments)    "stomach hurt"  . Codeine Nausea And Vomiting    jittery  . Iohexol      Code: RASH, Desc: PT STATES SHE IS ALLERGIC TO IV CONTRAST 05/28/06/RM, Onset Date: [id number]   . Meperidine Hcl Nausea And Vomiting  . Morphine And Related Nausea And Vomiting    FAMILY HISTORY:  family history includes Cancer in an other family member; Diabetes in an other family member. SOCIAL HISTORY:  reports that she has never smoked. She has never used smokeless tobacco. She reports that she does not drink alcohol or use illicit drugs.  REVIEW OF SYSTEMS:     Constitutional: Negative for fever, chills, weight loss, malaise/fatigue and diaphoresis.  HENT: Negative for hearing loss, ear pain, nosebleeds, congestion, sore throat, neck pain, tinnitus and ear discharge.   Eyes: Negative for blurred vision, double vision, photophobia, pain, discharge and redness.  Respiratory: Negative for cough, hemoptysis, sputum production, shortness of breath, wheezing and stridor.   Cardiovascular: Negative for chest pain, palpitations, orthopnea, claudication, leg swelling and PND.  Gastrointestinal: Negative for heartburn, nausea, vomiting, abdominal pain, diarrhea, constipation, blood in stool and melena.  Genitourinary: Negative for dysuria, urgency, frequency, hematuria and flank pain.  Musculoskeletal: Negative for myalgias, back pain, joint pain and falls.  Skin: Negative for itching and rash.  Neurological: Negative for dizziness, tingling, tremors, sensory change, speech change, focal weakness, seizures, loss of consciousness, weakness and headaches.  Endo/Heme/Allergies: Negative for environmental allergies and polydipsia. Does not bruise/bleed easily.  SUBJECTIVE:   VITAL SIGNS: Temp:  [98.4 F (36.9 C)-102.7 F (39.3 C)] 99.2 F (37.3 C) (09/07 0556) Pulse Rate:  [68-89] 80 (09/07 0353) Resp:  [16-18] 17 (09/07 0353) BP: (113-132)/(66-80) 113/66 mmHg (09/07 0353) SpO2:  [98 %-100 %] 98 % (09/07 0353)  PHYSICAL EXAMINATION: General:  Frail, ill appearing woman, no apparent distress Neuro:  No focal deficits HEENT:  PERRLA, Moist mucus membranes. Cardiovascular:  RRR Lungs:  Clear. No wheeze or crackles. Abdomen:  Distended, Soft, + BS Skin:  Intact   Recent Labs Lab 01/21/15 0400 01/22/15 0420 01/23/15 0505  NA 132* 137 131*  K 3.4* 4.0 4.5  CL 96* 101 97*  CO2 28 28 26   BUN <5* 5* <5*  CREATININE 0.41* 0.48 0.42*  GLUCOSE 115* 76 78    Recent Labs Lab 01/21/15 0400 01/22/15 0420 01/23/15 0505  HGB 10.9* 11.3* 10.5*  HCT  34.0* 35.4* 32.1*  WBC 7.1 7.6 7.2  PLT 345 335 296   No results found.  ASSESSMENT / PLAN: 44 year-old with stage III sarcoidosis. Not on any immunosuppression. Now comes with fever, cough and CT scan findings of multiple pulmonary nodules. She has pulmonary nodules on her chest imaging as far back as 2003. But the most recent CAT scan shows more prominent nodules in the right lower lobe. This could be progression of her sarcoidosis but an infectious etiology needs to be ruled out. Patient is unable to produce sputum for culture  Will plan for bronchoscope with BAL of right lower lobe tomorrow at 8AM. We will also do transbronchial biopsies if the patient tolerates. Keep NPO after midnight. Check coags.  Marshell Garfinkel MD [facility] Pager [phone] If no answer or after 3pm call: [phone] 01/23/2015, 10:58 AM

## 2015-01-23 NOTE — Progress Notes (Signed)
Patient ID: NAIRA STANDIFORD, female   DOB: [birth year], 44 y.o.   MRN: [id number]    Patient Name: KHALIE WINCE Date of Encounter: 01/23/2015     Active Problems:   Diabetes mellitus, type 2   Essential hypertension   SYNDROME, LONG QT   PSVT   OBSTRUCTIVE CHRONIC BRONCHITIS   Palpitations   Automatic implantable cardioverter-defibrillator in situ   Elevated alkaline phosphatase level   Hypokalemia   HCAP (healthcare-associated pneumonia)    SUBJECTIVE  Continued fever/chills/difficulty sleeping  CURRENT MEDS . budesonide (PULMICORT) nebulizer solution  0.25 mg Nebulization BID  . cycloSPORINE  1 drop Both Eyes BID  . enoxaparin (LOVENOX) injection  40 mg Subcutaneous Daily  . feeding supplement (ENSURE ENLIVE)  237 mL Oral TID BM  . insulin aspart  0-9 Units Subcutaneous TID WC  . insulin glargine  25 Units Subcutaneous QHS  . Linaclotide  145 mcg Oral Daily  . olopatadine  1 drop Both Eyes BID  . pantoprazole  40 mg Oral Q0600  . potassium chloride SA  20 mEq Oral Daily  . propranolol  80 mg Oral BID  . sodium chloride  3 mL Intravenous Q12H    OBJECTIVE  Filed Vitals:   01/22/15 2036 01/22/15 2048 01/23/15 0353 01/23/15 0556  BP: 113/74  113/66   Pulse: 77 68 80   Temp: 98.4 F (36.9 C)  101.8 F (38.8 C) 99.2 F (37.3 C)  TempSrc: Oral  Oral Oral  Resp: 18 16 17    Height:      Weight:      SpO2: 100% 98% 98%     Intake/Output Summary (Last 24 hours) at 01/23/15 0753 Last data filed at 01/23/15 0358  Gross per 24 hour  Intake   1820 ml  Output    400 ml  Net   1420 ml   Filed Weights   01/19/15 2024  Weight: 132 lb (59.875 kg)    PHYSICAL EXAM  General: Pleasant, NAD. Neuro: Alert and oriented X 3. Moves all extremities spontaneously. Psych: Normal affect. HEENT:  Normal  Neck: Supple without bruits or JVD. Lungs:  Resp regular and unlabored, CTA. Heart: RRR no s3, s4, or murmurs. Abdomen: Soft, non-tender, non-distended, BS + x 4.    Extremities: No clubbing, cyanosis or edema. DP/PT/Radials 2+ and equal bilaterally.  Accessory Clinical Findings  CBC  Recent Labs  01/22/15 0420 01/23/15 0505  WBC 7.6 7.2  HGB 11.3* 10.5*  HCT 35.4* 32.1*  MCV 86.6 85.6  PLT 335 269   Basic Metabolic Panel  Recent Labs  01/22/15 0420 01/23/15 0505  NA 137 131*  K 4.0 4.5  CL 101 97*  CO2 28 26  GLUCOSE 76 78  BUN 5* <5*  CREATININE 0.48 0.42*  CALCIUM 9.1 8.6*  MG 1.8  --    Liver Function Tests  Recent Labs  01/20/15 0935  AST 35  ALT 12*  ALKPHOS 609*  BILITOT 0.5  PROT 7.7  ALBUMIN 1.8*   No results for input(s): LIPASE, AMYLASE in the last 72 hours. Cardiac Enzymes  Recent Labs  01/20/15 0935 01/20/15 1537  TROPONINI <0.03 <0.03   BNP Invalid input(s): POCBNP D-Dimer  Recent Labs  01/20/15 1537  DDIMER 3.79*   Hemoglobin A1C No results for input(s): HGBA1C in the last 72 hours. Fasting Lipid Panel No results for input(s): CHOL, HDL, LDLCALC, TRIG, CHOLHDL, LDLDIRECT in the last 72 hours. Thyroid Function Tests No results for input(s): TSH, T4TOTAL, T3FREE,  THYROIDAB in the last 72 hours.  Invalid input(s): FREET3  TELE  nsr with no tdp, rare pvc  Radiology/Studies  Ct Abdomen Pelvis Wo Contrast  01/20/2015   CLINICAL DATA:  Acute onset of epigastric abdominal pain and swelling. Initial encounter.  EXAM: CT ABDOMEN AND PELVIS WITHOUT CONTRAST  TECHNIQUE: Multidetector CT imaging of the abdomen and pelvis was performed following the standard protocol without IV contrast.  COMPARISON:  CT of the abdomen and pelvis performed 10/25/2014, and abdominal ultrasound performed 10/08/2014  FINDINGS: Diffuse tiny nodular opacities within the visualized right lower lobe and right middle lobe raise suspicion for atypical infection. An AICD lead is partially imaged.  Splenomegaly and hepatomegaly are again noted. The spleen measures 14.3 cm in length, while the liver measures 27.9 cm in length. A  slightly nodular contour to the liver raises concern for mild hepatic cirrhosis. 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 The vasculature is not well assessed without contrast.  The gallbladder is relatively decompressed and within normal limits. The pancreas and adrenal glands are unremarkable.  The kidneys are unremarkable in appearance. There is no evidence of hydronephrosis. No renal or ureteral stones are seen. No perinephric stranding is appreciated.  No free fluid is identified. The small bowel is unremarkable in appearance. The stomach is within normal limits. No acute vascular abnormalities are seen.  The appendix is normal in caliber and contains air without evidence of appendicitis. The colon is unremarkable in appearance.  The bladder is moderately distended and grossly unremarkable. The patient is status post hysterectomy. No suspicious adnexal masses are seen. No inguinal lymphadenopathy is seen.  No acute osseous abnormalities are identified.  IMPRESSION: 1. Diffuse tiny nodular opacities within the right lower lung lobe and right middle lung lobe raise suspicion for atypical pneumonia. This is new from the prior study. 2. Splenomegaly and hepatomegaly again noted. 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 Apparent gastric varices suggested, not well seen without contrast.   Electronically Signed   By: Garald Balding M.D.   On: 01/20/2015 00:12   Dg Chest 2 View  01/19/2015   CLINICAL DATA:  Cough and fever  EXAM: CHEST  2 VIEW  COMPARISON:  06/29/2014  FINDINGS: AICD remains in good position. Heart size normal. Negative for heart failure. Lungs are clear without infiltrate or effusion.  IMPRESSION: No active cardiopulmonary disease.   Electronically Signed   By: Franchot Gallo M.D.   On: 01/19/2015 21:44   Ct Chest Wo Contrast  01/21/2015   CLINICAL DATA:  44 year old female with history of tachycardia.  EXAM: CT CHEST WITHOUT CONTRAST  TECHNIQUE:  Multidetector CT imaging of the chest was performed following the standard protocol without IV contrast.  COMPARISON:  No priors.  FINDINGS: Mediastinum/Lymph Nodes: Heart size is normal. There is no significant pericardial fluid, thickening or pericardial calcification. Left-sided pacemaker/AICD in position with lead tip terminating in the right ventricular apex. Mild dilatation of the pulmonic trunk (3.6 cm in diameter). Multiple borderline enlarged and mildly enlarged mediastinal lymph nodes, measuring up to 16 mm in the subcarinal nodal station. Esophagus is unremarkable in appearance. No axillary lymphadenopathy.  Lungs/Pleura: There are innumerable tiny sub cm pulmonary nodules scattered throughout the lungs bilaterally. The majority of these are in the right lung, predominantly in the right lower lobe where they appear to be predominantly peribronchovascular in distribution. Some subpleural nodularity is also noted along the fissures bilaterally. No larger more suspicious appearing pulmonary nodules or masses are  otherwise noted. No acute consolidative airspace disease. No pleural effusions.  Upper Abdomen: Spleen is incompletely visualized, but appears enlarged, measuring up to 15.3 x 7.7 cm on axial images. Liver is also incompletely visualized, but appears potentially enlarged.  Musculoskeletal/Soft Tissues: There are no aggressive appearing lytic or blastic lesions noted in the visualized portions of the skeleton.  IMPRESSION: 1. There is a spectrum of findings in the thorax that is most likely related to the patient's history of sarcoidosis. Specifically, there is a perilymphatic distribution of multiple tiny subcentimeter pulmonary nodules, in addition to mediastinal lymphadenopathy. 2. Dilatation of the pulmonic trunk (3.6 cm in diameter), suggestive of pulmonary arterial hypertension. 3. Splenomegaly. 4. Probable hepatomegaly.   Electronically Signed   By: Vinnie Langton M.D.   On: 01/21/2015 08:09      ASSESSMENT AND PLAN  1. Torsades/VF with multiple ICD shocks 2. Long QT now tolerating propranolol. We could increase dose if needed 3. Recurrent nightly fever, ?Atypical pneumonia without evidence of sepsis 4. 70 lb weight loss Rec: ID service consult reviewed. Use of both macrolide antibiotics and quinolone antibiotics would both be best avoided if at all possible. At what point if the diagnosis of atypical pneuomonia is being considered and treated should bronchoscopy be considered.   Karel Mowers,M.D.  01/23/2015 7:53 AM

## 2015-01-23 NOTE — Progress Notes (Addendum)
ANTIBIOTIC CONSULT NOTE - FOLLOW UP  Pharmacy Consult for rocephin/vancomycin Indication: fever  Allergies  Allergen Reactions  . Contrast Media [Iodinated Diagnostic Agents] Anaphylaxis and Shortness Of Breath    Needed to be defibrillated   . Augmentin [Amoxicillin-Pot Clavulanate] Other (See Comments)    "stomach hurt"  . Codeine Nausea And Vomiting    jittery  . Iohexol      Code: RASH, Desc: PT STATES SHE IS ALLERGIC TO IV CONTRAST 05/28/06/RM, Onset Date: [id number]   . Meperidine Hcl Nausea And Vomiting  . Morphine And Related Nausea And Vomiting    Patient Measurements: Height: 5\' 7"  (170.2 cm) Weight: 132 lb (59.875 kg) IBW/kg (Calculated) : 61.6   Vital Signs: Temp: 99.2 F (37.3 C) (09/07 0556) Temp Source: Oral (09/07 0556) BP: 113/66 mmHg (09/07 0353) Pulse Rate: 80 (09/07 0353) Intake/Output from previous day: 09/06 0701 - 09/07 0700 In: 7035 [P.O.:720; I.V.:1100] Out: 400 [Urine:400] Intake/Output from this shift: Total I/O In: 240 [P.O.:240] Out: 600 [Urine:600]  Labs:  Recent Labs  01/21/15 0400 01/22/15 0420 01/23/15 0505  WBC 7.1 7.6 7.2  HGB 10.9* 11.3* 10.5*  PLT 345 335 296  CREATININE 0.41* 0.48 0.42*   Estimated Creatinine Clearance: 85.7 mL/min (by C-G formula based on Cr of 0.42). No results for input(s): VANCOTROUGH, VANCOPEAK, VANCORANDOM, GENTTROUGH, GENTPEAK, GENTRANDOM, TOBRATROUGH, TOBRAPEAK, TOBRARND, AMIKACINPEAK, AMIKACINTROU, AMIKACIN in the last 72 hours.   Microbiology: Recent Results (from the past 720 hour(s))  Blood Culture (routine x 2)     Status: None (Preliminary result)   Collection Time: 01/19/15  8:59 PM  Result Value Ref Range Status   Specimen Description BLOOD RIGHT FOREARM  Final   Special Requests BOTTLES DRAWN AEROBIC AND ANAEROBIC 5CC  Final   Culture NO GROWTH 3 DAYS  Final   Report Status PENDING  Incomplete  Blood Culture (routine x 2)     Status: None (Preliminary result)   Collection Time:  01/19/15  9:05 PM  Result Value Ref Range Status   Specimen Description BLOOD RIGHT HAND  Final   Special Requests BOTTLES DRAWN AEROBIC AND ANAEROBIC 5CC  Final   Culture NO GROWTH 3 DAYS  Final   Report Status PENDING  Incomplete  Urine culture     Status: None   Collection Time: 01/19/15 10:02 PM  Result Value Ref Range Status   Specimen Description URINE, CLEAN CATCH  Final   Special Requests NONE  Final   Culture NO GROWTH 2 DAYS  Final   Report Status 01/21/2015 FINAL  Final  Culture, blood (routine x 2)     Status: None (Preliminary result)   Collection Time: 01/22/15 12:50 PM  Result Value Ref Range Status   Specimen Description BLOOD RIGHT ARM  Final   Special Requests BOTTLES DRAWN AEROBIC ONLY  5CC  Final   Culture PENDING  Incomplete   Report Status PENDING  Incomplete    Anti-infectives    Start     Dose/Rate Route Frequency Ordered Stop   01/20/15 0600  piperacillin-tazobactam (ZOSYN) IVPB 3.375 g  Status:  Discontinued     3.375 g 12.5 mL/hr over 240 Minutes Intravenous 3 times per day 01/19/15 2335 01/20/15 0959   01/20/15 0600  vancomycin (VANCOCIN) 500 mg in sodium chloride 0.9 % 100 mL IVPB  Status:  Discontinued     500 mg 100 mL/hr over 60 Minutes Intravenous Every 8 hours 01/19/15 2335 01/20/15 0959   01/19/15 2045  piperacillin-tazobactam (ZOSYN) IVPB 3.375 g  3.375 g 100 mL/hr over 30 Minutes Intravenous  Once 01/19/15 2040 01/19/15 2224   01/19/15 2045  vancomycin (VANCOCIN) IVPB 1000 mg/200 mL premix     1,000 mg 200 mL/hr over 60 Minutes Intravenous  Once 01/19/15 2040 01/19/15 2224      Assessment: 44 yo female with fever with possible PNA/bactermia to begin rocephin and vancomycin. She was on vancomycin and zosyn previously for concern of HCAP. ID following and restarting antibiotics with recent fever. WBC= 7.2, tmax= 102.7, SCr= 0.42 and CrCl ~ 85.   Rocephin 9/7>> Vanc>> Vanc 9/3>>9/4 Zosyn 9/3>>9/3  9/6 blood x2 9/3 BCx: NGTD 9/3:  urine: neg  Goal of Therapy:  Vancomycin trough level 15-20 mcg/ml  Plan:  -Rocephin 1gm IV q24hr -Vancomycin 1000mg  IV followed by 500mg  IV q8h -Will follow renal function, cultures and clinical progress  Hildred Laser, Pharm D 01/23/2015 10:47 AM

## 2015-01-23 NOTE — Progress Notes (Signed)
Family Medicine Teaching Service Daily Progress Note Intern Pager: [phone]  Patient name: [name] Medical record number: [id number] Date of birth: July 07, 1970 Age: 44 y.o. Gender: female  Primary Care Provider: Cordelia Poche, MD Consultants: cards, ID, pulmonology   Code Status: full  Pt Overview and Major Events to Date:  9/3 - admitted with prolonged palpitations and low grade fever 9/5 - CT chest spectrum of findings related to sarcoidosis and dilation of pulmonic trunk likely related to [facility]  9/6-ICD interogated; Pulm and ID consulted   Assessment and Plan: [name] is a 44 y.o. female presenting with sensation of racing heart and fever at home to 102 . PMH is significant for Long QT, history of torsades, AICD in place, DM2, Sarcoidosis  Fevers: Patient was febrile to 100.7 on admission with lactate to 5.37 (chronically elevated per chart review).  On CT abd pelvis, small focal lesions seen in lower lung fields concerning for atypical pneumonia per radiology read. She was started on Vanc/Zosyn for concern for HCAP. Her abx were stopped and her illness was attributed to sarcoid, as supported by chest CT. Patient remained afebrile for over 24 hours after d/c of abx; however, began to spike fevers on the afternoon of 9/5. Due to continued intermittent fever spikes, repeat blood cultures were ordered and ID was consulted. Per ID, her syndrome could be attributed to an atypical mycobacterial PNA. Pulmonology was also consulted for possible bronchoscopy.  - follow BCx, UCx; NGTD x 3 days - trend CBC; WBC 7.2 (9/7) -repeat blood cultures pending  -appreciate ID recs >>> could tx with quinolone or macrolide but hesitant 2/2 to patient's prolonged QT interval; recommend restarting vanco/ceftriazone   -awaiting pulm recs    Cardiac: prolonged QTc to 545 on presentation, with significant history of prolonged Qtc, torsades de pointes, SVT with subsequent AICD placement. Last echo 6/13  at [facility] LV EF => 70%, borderline left ventricular hypertrophy, else largely normal. Troponin negative x3. ICD device interogated on 9/6 and working properly.  - Cariology consulted, appreciate recs >> increase propanolol if needed; recommend macrolides and quinolones be avoided if at all possible; prefer azithromycin if abx needed  - Telemetry  -K stable at 4.5 (9/7); continue home K supplements    Sarcoid/Obstructive chronic bronchitis- Stable per last Pulmonology assessment on 01/07/2015, current presentation concerning for worsening disease status - ESR 96, CRP 13.2 (unclear whether this represents a significant change from her baseline chronic inflammatory condition) - continue home Qvar -pulmonology consulted; awaiting recs   DM2- Last A1c 6.3 (Aug 2016); Patient has been having low CBGs. Lantus decreased from 35u qd to 25u qd on 9/6.  - consider lowering Lantus further as patient continued to have low CBGs overnight and has not need Novolog for over 24 hours  - SSI - CBG ACHS  Weight loss/Anorexia- Approximately 55 lb weight loss from 12/2013 to 01/20/2015 associated with decreased appetite and early satiety. Extensive work up by GI with EGD, CT, US abdomen and gastric emptying study noted to be unremarkable in terms of causes of weight loss. Potentially secondary to sardoidosis, and decreased PO intake. CT Ab did show hepatosplenomegaly with possible gastric varices.  - Nutrition consult --> Ensure Enlive between meals and magic cup with meals   Elevated Alk phos- Alk phos 674, elevated since 09/2014. GHTT on 01/15/2015 elevated to 372, remarkable for intrahepatic cause of elevation. Normal bilirubin and liver transaminases, recent and [place] negative for liver or gall bladder pathology. Negative Hep  B serologies 12/24/2014, pending Hep C serologies but largely normal LFTS makes viral heptatitis etiology less likely. Concerning for sarcoid involvement to liver, vs other intra hepatic cause   - consider GI consult vs outpt f/u  Hepato-splenomegally- persistent since being noted on 10/25/2014, now with concern for gastric varices and nodular liver edge concerning for cirrhosis. ? Possible amyloidosis given elevated ALP, previously elevated T protein even with low albumin - consider GI consult vs outpt f/u - SPEP/UPEP pending  - CRP 13.2, ESR 96 - Iron studies: ferritin 932 c/w chronic inflammation  - GI recently placed future orders for ANA, IgG, Mitochondrial & Smooth muscle Antibodies; consider obtaining while inpatient  FEN/GI: Carb/heart, SLIV Prophylaxis: Lovenox  Disposition: home pending clinical improvement and cardiology eval  Subjective:  No episodes of torsades overnight. Denies palpitations or chest pain. Febrile to 101.8 overnight. Endorses chronic cough.   Objective: Temp:  [98.4 F (36.9 C)-102.7 F (39.3 C)] 99.2 F (37.3 C) (09/07 0556) Pulse Rate:  [68-89] 80 (09/07 0353) Resp:  [16-18] 17 (09/07 0353) BP: (113-132)/(66-80) 113/66 mmHg (09/07 0353) SpO2:  [98 %-100 %] 98 % (09/07 0353) Physical Exam: General: NAD, lying in bed HEENT: EOMI, MMM Cardiovascular: RRR, no murmurs Respiratory: CTAB, normal work of breathing Abdomen: soft, diffusely tender to palpation no rebound mild voluntary guarding Skin: no rashes or lesion Neuro: No focal deficits Psych: normal mood and affect  Laboratory:  Recent Labs Lab 01/21/15 0400 01/22/15 0420 01/23/15 0505  WBC 7.1 7.6 7.2  HGB 10.9* 11.3* 10.5*  HCT 34.0* 35.4* 32.1*  PLT 345 335 296    Recent Labs Lab 01/19/15 2059 01/20/15 0935 01/21/15 0400 01/22/15 0420 01/23/15 0505  NA 128* 130* 132* 137 131*  K 3.2* 3.8 3.4* 4.0 4.5  CL 89* 96* 96* 101 97*  CO2 _0 BUN <5* <5* <5* 5* <5*  CREATININE 0.69 0.55 0.41* 0.48 0.42*  CALCIUM 8.3* 8.1* 8.6* 9.1 8.6*  PROT 7.8 7.7  --   --   --   BILITOT 0.4 0.5  --   --   --   ALKPHOS 674* 609*  --   --   --   ALT 12* 12*  --   --   --    AST 44* 35  --   --   --   GLUCOSE 158* 131* 115* 76 78   Lactic acid 5.37->4.37->4.1 (chronically elevated) ESR 96 CRP 13.2  Imaging/Diagnostic Tests: 9/3 CT abd/pelvis  1. Diffuse tiny nodular opacities within the right lower lung lobe and right middle lung lobe raise suspicion for atypical pneumonia. This is new from the prior study. 2. Splenomegaly and hepatomegaly again noted. 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 Apparent gastric varices suggested, not well seen without contrast.  9/3 CXR No active cardiopulmonary disease.  Nicolette Bang, DO 01/23/2015, 8:31 AM PGY-1, [place] Intern pager: [phone], text pages welcome

## 2015-01-23 NOTE — Progress Notes (Signed)
INFECTIOUS DISEASE PROGRESS NOTE  ID: [name] is a 44 y.o. female with  Active Problems:   Diabetes mellitus, type 2   Essential hypertension   SYNDROME, LONG QT   PSVT   OBSTRUCTIVE CHRONIC BRONCHITIS   Palpitations   Automatic implantable cardioverter-defibrillator in situ   Elevated alkaline phosphatase level   Hypokalemia   HCAP (healthcare-associated pneumonia)  Subjective: No complaints, no pain.   Abtx:  Anti-infectives    Start     Dose/Rate Route Frequency Ordered Stop   01/20/15 0600  piperacillin-tazobactam (ZOSYN) IVPB 3.375 g  Status:  Discontinued     3.375 g 12.5 mL/hr over 240 Minutes Intravenous 3 times per day 01/19/15 2335 01/20/15 0959   01/20/15 0600  vancomycin (VANCOCIN) 500 mg in sodium chloride 0.9 % 100 mL IVPB  Status:  Discontinued     500 mg 100 mL/hr over 60 Minutes Intravenous Every 8 hours 01/19/15 2335 01/20/15 0959   01/19/15 2045  piperacillin-tazobactam (ZOSYN) IVPB 3.375 g     3.375 g 100 mL/hr over 30 Minutes Intravenous  Once 01/19/15 2040 01/19/15 2224   01/19/15 2045  vancomycin (VANCOCIN) IVPB 1000 mg/200 mL premix     1,000 mg 200 mL/hr over 60 Minutes Intravenous  Once 01/19/15 2040 01/19/15 2224      Medications:  Scheduled: . budesonide (PULMICORT) nebulizer solution  0.25 mg Nebulization BID  . cycloSPORINE  1 drop Both Eyes BID  . enoxaparin (LOVENOX) injection  40 mg Subcutaneous Daily  . feeding supplement (ENSURE ENLIVE)  237 mL Oral TID BM  . insulin aspart  0-9 Units Subcutaneous TID WC  . insulin glargine  25 Units Subcutaneous QHS  . Linaclotide  145 mcg Oral Daily  . olopatadine  1 drop Both Eyes BID  . pantoprazole  40 mg Oral Q0600  . potassium chloride SA  20 mEq Oral Daily  . propranolol  80 mg Oral BID  . sodium chloride  3 mL Intravenous Q12H    Objective: Vital signs in last 24 hours: Temp:  [98.4 F (36.9 C)-102.7 F (39.3 C)] 99.2 F (37.3 C) (09/07 0556) Pulse Rate:  [68-89] 80  (09/07 0353) Resp:  [16-18] 17 (09/07 0353) BP: (113-132)/(66-80) 113/66 mmHg (09/07 0353) SpO2:  [98 %-100 %] 98 % (09/07 0353)   General appearance: alert, cooperative and no distress Resp: clear to auscultation bilaterally Chest wall: no tenderness, no fluid at AICD site, non-tender, no eryhtema.  Cardio: regular rate and rhythm GI: normal findings: bowel sounds normal and soft, non-tender  Lab Results  Recent Labs  01/22/15 0420 01/23/15 0505  WBC 7.6 7.2  HGB 11.3* 10.5*  HCT 35.4* 32.1*  NA 137 131*  K 4.0 4.5  CL 101 97*  CO2 28 26  BUN 5* <5*  CREATININE 0.48 0.42*   Liver Panel  Recent Labs  01/20/15 0935  PROT 7.7  ALBUMIN 1.8*  AST 35  ALT 12*  ALKPHOS 609*  BILITOT 0.5   Sedimentation Rate No results for input(s): ESRSEDRATE in the last 72 hours. C-Reactive Protein No results for input(s): CRP in the last 72 hours.  Microbiology: Recent Results (from the past 240 hour(s))  Blood Culture (routine x 2)     Status: None (Preliminary result)   Collection Time: 01/19/15  8:59 PM  Result Value Ref Range Status   Specimen Description BLOOD RIGHT FOREARM  Final   Special Requests BOTTLES DRAWN AEROBIC AND ANAEROBIC 5CC  Final   Culture NO  GROWTH 3 DAYS  Final   Report Status PENDING  Incomplete  Blood Culture (routine x 2)     Status: None (Preliminary result)   Collection Time: 01/19/15  9:05 PM  Result Value Ref Range Status   Specimen Description BLOOD RIGHT HAND  Final   Special Requests BOTTLES DRAWN AEROBIC AND ANAEROBIC 5CC  Final   Culture NO GROWTH 3 DAYS  Final   Report Status PENDING  Incomplete  Urine culture     Status: None   Collection Time: 01/19/15 10:02 PM  Result Value Ref Range Status   Specimen Description URINE, CLEAN CATCH  Final   Special Requests NONE  Final   Culture NO GROWTH 2 DAYS  Final   Report Status 01/21/2015 FINAL  Final  Culture, blood (routine x 2)     Status: None (Preliminary result)   Collection Time:  01/22/15 12:50 PM  Result Value Ref Range Status   Specimen Description BLOOD RIGHT ARM  Final   Special Requests BOTTLES DRAWN AEROBIC ONLY  5CC  Final   Culture PENDING  Incomplete   Report Status PENDING  Incomplete    Studies/Results: No results found.   Assessment/Plan: Fever Sarcoid DM2 AICD for long QT syndrome  Total days of antibiotics: off  Would: Restart vanco/ceftriaxone Await bcx Appreciate cv input, hold macrolide/quinolone ACE level pending CCM eval         Bobby Rumpf Infectious Diseases (pager) [phone] [email or web address] 01/23/2015, 8:40 AM  LOS: 3 days

## 2015-01-23 NOTE — Progress Notes (Signed)
Paged family medicine and notified by MD Bonner Puna to hold pt's Lantus for the night due to her CBG previously dropping in the morning and current NPO status. Pt is resting. No complaints at this time. Will continue to monitor.   Shaheem Pichon, RN

## 2015-01-23 NOTE — Progress Notes (Signed)
Hypoglycemic Event  CBG: 52  Treatment: 15 GM carbohydrate snack  Symptoms: None  Follow-up CBG: Time:1140 CBG Result:79  Possible Reasons for Event: Inadequate meal intake    [name] M  Remember to initiate Hypoglycemia Order Set & complete

## 2015-01-23 NOTE — Progress Notes (Signed)
Hypoglycemic Event  CBG: 58  Treatment: 15 GM carbohydrate snack (orange juice)  Symptoms: None  Follow-up CBG: [id number] CBG Result:80  Possible Reasons for Event: night CBG 87, 25 units Lantus given. Snacks provided to patient throughout the night. Blood sugar still low. Will follow up with day RN to re-evaluate with MD Lantus dosage.       Evany Schecter RN  Remember to initiate Hypoglycemia Order Set & complete

## 2015-01-24 ENCOUNTER — Inpatient Hospital Stay (HOSPITAL_COMMUNITY): Payer: Medicaid Other

## 2015-01-24 ENCOUNTER — Encounter (HOSPITAL_COMMUNITY)
Admission: EM | Disposition: A | Discharge status: Home Health | Payer: Self-pay | Source: Home / Self Care | Attending: Family Medicine

## 2015-01-24 HISTORY — PX: VIDEO BRONCHOSCOPY: SHX5072

## 2015-01-24 LAB — BASIC METABOLIC PANEL
ANION GAP: 6 (ref 5–15)
CHLORIDE: 98 mmol/L — AB (ref 101–111)
CO2: 28 mmol/L (ref 22–32)
Calcium: 8.7 mg/dL — ABNORMAL LOW (ref 8.9–10.3)
Creatinine, Ser: 0.36 mg/dL — ABNORMAL LOW (ref 0.44–1.00)
GFR calc Af Amer: >60 mL/min (ref >60)
GLUCOSE: 49 mg/dL — AB (ref 65–99)
POTASSIUM: 3.3 mmol/L — AB (ref 3.5–5.1)
Sodium: 132 mmol/L — ABNORMAL LOW (ref 135–145)

## 2015-01-24 LAB — GLUCOSE, CAPILLARY
GLUCOSE-CAPILLARY: 128 mg/dL — AB (ref 65–99)
GLUCOSE-CAPILLARY: 80 mg/dL (ref 65–99)
Glucose-Capillary: 124 mg/dL — ABNORMAL HIGH (ref 65–99)
Glucose-Capillary: 45 mg/dL — ABNORMAL LOW (ref 65–99)
Glucose-Capillary: 72 mg/dL (ref 65–99)
Glucose-Capillary: 72 mg/dL (ref 65–99)

## 2015-01-24 LAB — LACTIC ACID, PLASMA: LACTIC ACID, VENOUS: 1.2 mmol/L (ref 0.5–2.0)

## 2015-01-24 LAB — CULTURE, BLOOD (ROUTINE X 2)
CULTURE: NO GROWTH
CULTURE: NO GROWTH

## 2015-01-24 LAB — CBC
HCT: 33.1 % — ABNORMAL LOW (ref 36.0–46.0)
HEMOGLOBIN: 10.8 g/dL — AB (ref 12.0–15.0)
MCH: 27.9 pg (ref 26.0–34.0)
MCHC: 32.6 g/dL (ref 30.0–36.0)
MCV: 85.5 fL (ref 78.0–100.0)
PLATELETS: 357 10*3/uL (ref 150–400)
RBC: 3.87 MIL/uL (ref 3.87–5.11)
RDW: 14.1 % (ref 11.5–15.5)
WBC: 8 10*3/uL (ref 4.0–10.5)

## 2015-01-24 LAB — OCCULT BLOOD X 1 CARD TO LAB, STOOL: FECAL OCCULT BLD: POSITIVE — AB

## 2015-01-24 LAB — VANCOMYCIN, TROUGH: Vancomycin Tr: 8 ug/mL — ABNORMAL LOW (ref 10.0–20.0)

## 2015-01-24 SURGERY — VIDEO BRONCHOSCOPY WITHOUT FLUORO
Anesthesia: Moderate Sedation | Laterality: Bilateral

## 2015-01-24 MED ORDER — DEXTROSE 50 % IV SOLN
INTRAVENOUS | Status: AC
  Administered 2015-01-24: 25 mL
  Filled 2015-01-24: qty 50

## 2015-01-24 MED ORDER — TRAMADOL HCL 50 MG PO TABS
50.0000 mg | ORAL_TABLET | Freq: Four times a day (QID) | ORAL | Status: DC | PRN
  Administered 2015-01-24 – 2015-01-29 (×9): 50 mg via ORAL
  Filled 2015-01-24 (×9): qty 1

## 2015-01-24 MED ORDER — FENTANYL CITRATE (PF) 100 MCG/2ML IJ SOLN
INTRAMUSCULAR | Status: AC
  Filled 2015-01-24: qty 4

## 2015-01-24 MED ORDER — MIDAZOLAM HCL 5 MG/ML IJ SOLN
INTRAMUSCULAR | Status: DC | PRN
  Administered 2015-01-24 (×3): 1 mg via INTRAVENOUS

## 2015-01-24 MED ORDER — SODIUM CHLORIDE 0.9 % IV SOLN
INTRAVENOUS | Status: DC
  Administered 2015-01-24 – 2015-01-28 (×2): via INTRAVENOUS

## 2015-01-24 MED ORDER — LIDOCAINE HCL 2 % EX GEL
CUTANEOUS | Status: DC | PRN
  Administered 2015-01-24: 1

## 2015-01-24 MED ORDER — VANCOMYCIN HCL IN DEXTROSE 1-5 GM/200ML-% IV SOLN
1000.0000 mg | Freq: Three times a day (TID) | INTRAVENOUS | Status: DC
  Administered 2015-01-25 – 2015-01-26 (×5): 1000 mg via INTRAVENOUS
  Filled 2015-01-24 (×7): qty 200

## 2015-01-24 MED ORDER — DEXTROMETHORPHAN POLISTIREX ER 30 MG/5ML PO SUER
15.0000 mg | Freq: Two times a day (BID) | ORAL | Status: DC | PRN
  Administered 2015-01-25 – 2015-01-28 (×5): 15 mg via ORAL
  Filled 2015-01-24 (×7): qty 5

## 2015-01-24 MED ORDER — MIDAZOLAM HCL 5 MG/ML IJ SOLN
INTRAMUSCULAR | Status: AC
  Filled 2015-01-24: qty 2

## 2015-01-24 MED ORDER — LIDOCAINE HCL (PF) 1 % IJ SOLN
INTRAMUSCULAR | Status: DC | PRN
  Administered 2015-01-24: 6 mL

## 2015-01-24 MED ORDER — FENTANYL CITRATE (PF) 100 MCG/2ML IJ SOLN
25.0000 ug | INTRAMUSCULAR | Status: AC | PRN
  Administered 2015-01-24 (×2): 25 ug via INTRAVENOUS

## 2015-01-24 MED ORDER — PHENYLEPHRINE HCL 0.25 % NA SOLN
NASAL | Status: DC | PRN
  Administered 2015-01-24: 2 via NASAL

## 2015-01-24 MED ORDER — FENTANYL CITRATE (PF) 100 MCG/2ML IJ SOLN
25.0000 ug | INTRAMUSCULAR | Status: AC | PRN
  Administered 2015-01-24: 25 ug via INTRAVENOUS

## 2015-01-24 MED ORDER — POTASSIUM CHLORIDE CRYS ER 20 MEQ PO TBCR
40.0000 meq | EXTENDED_RELEASE_TABLET | Freq: Once | ORAL | Status: AC
  Administered 2015-01-24: 40 meq via ORAL
  Filled 2015-01-24: qty 2

## 2015-01-24 NOTE — Progress Notes (Signed)
Inpatient Diabetes Program Recommendations  AACE/ADA: New Consensus Statement on Inpatient Glycemic Control (2013)  Target Ranges:  Prepandial:   less than 140 mg/dL      Peak postprandial:   less than 180 mg/dL (1-2 hours)      Critically ill patients:  140 - 180 mg/dL   Results for [name], [name] (MRN [id number]) as of 01/24/2015 13:25  Ref. Range 01/23/2015 05:56 01/23/2015 06:28 01/23/2015 11:17 01/23/2015 11:47 01/23/2015 16:53 01/23/2015 21:12 01/24/2015 04:51 01/24/2015 05:17 01/24/2015 06:40 01/24/2015 11:25  Glucose-Capillary Latest Ref Range: 65-99 mg/dL 58 (L) 80 52 (L) 79 81 88 45 (L) 124 (H) 72 72    Diabetes history: DM2 Outpatient Diabetes medications: Amaryl 2 mg BID, Lantus 35 units QHS, Novolog 4-8 units TID with meals Current orders for Inpatient glycemic control: Lantus 20 units QHS, Novolog 0-9 units TID with meals  Inpatient Diabetes Program Recommendations Insulin - Basal: Last dose of Lantus 25 units was given on 01/22/15 at 22:22. Noted patient did NOT RECEIVE any Lantus last night and fasting glucose was 45 mg/dl this morning. Lantus was decreased from 25 to 20 units on 01/23/15. Please consider discontinuing Lantus at this time and use Novolog correction if needed for glycemic control. Will continue to follow and make further recommendations as more data is collected.  Thanks, Barnie Alderman, RN, MSN, CCRN, CDE Diabetes Coordinator Inpatient Diabetes Program [phone] (Team Pager from [place] to [place]) [phone] ([facility] office) [phone] [facility] office) [phone] [facility] office)

## 2015-01-24 NOTE — Progress Notes (Signed)
Inpatient Diabetes Program Recommendations  AACE/ADA: New Consensus Statement on Inpatient Glycemic Control (2013)  Target Ranges:  Prepandial:   less than 140 mg/dL      Peak postprandial:   less than 180 mg/dL (1-2 hours)      Critically ill patients:  140 - 180 mg/dL   Noted low glucose this am as well as yesterday am-lantus order was decreased from 25 units to 20 units last night, however it was not given due to previous lows yesterday and NPO status. Yet this am pt again had hypoglycemia without lantus at all. Recommend that lantus decrease again to 10 units tonight or any at all.   Thank you Rosita Kea, RN, MSN, CDE  Diabetes Inpatient Program Office: [phone] Pager: [phone] 8:00 am to 5:00 pm

## 2015-01-24 NOTE — Progress Notes (Signed)
Hypoglycemic Event  CBG: 45  Treatment: D50 IV 25 mL  Symptoms: None  Follow-up CBG: Time:0515 CBG Result:124  Possible Reasons for Event: Inadequate meal intake Pt NPO      Kunaal Walkins RN  Remember to initiate Hypoglycemia Order Set & complete

## 2015-01-24 NOTE — Progress Notes (Signed)
Video bronchoscopy performed.  Intervention bronchial washing.  Patient tolerated procedure well.  Will continue to monitor.

## 2015-01-24 NOTE — Procedures (Signed)
PCCM Video Bronchoscopy Procedure Note  The patient was informed of the risks (including but not limited to bleeding, infection, respiratory failure, lung injury, tooth/oral injury) and benefits of the procedure and gave consent, see chart.  Indication: Evaluate infection.  Post Procedure Diagnosis: Sarcoidosis  Location: Bilateral  Condition pre procedure: Stable  Medications for procedure: Versed 3 mg, Fentanyl 75 mcg, Topical lidocaine 10cc.  Procedure description: The bronchoscope was introduced through the nose and passed to the bilateral lungs to the level of the subsegmental bronchi throughout the tracheobronchial tree.  Airway exam revealed normal anatomy and mucosa. No secretions.  Procedures performed: Right lower lobe BAL. The scope was wedged into the anterior basal sub segment of the right lower lobe. Serial lavage were performed with 60 cc X 3. The return was about 50 cc. No hemorrhage noted. Patient had a transient desat to mid 80s towards the end, hence transbronchial biopsies were not done.  Specimens sent: BAL for cell count, cultures, AFB smear and cultures, cytology and flow.  Condition post procedure: Stable. Transient desat to 80s improved after she became more alert.  EBL: 0cc  Complications: None.  Marshell Garfinkel MD [name] Pager [phone] If no answer or after 3pm call: [phone] 01/24/2015, 8:45 AM

## 2015-01-24 NOTE — Progress Notes (Signed)
ANTIBIOTIC CONSULT NOTE - FOLLOW UP  Pharmacy Consult for rocephin/vancomycin Indication: fever  Allergies  Allergen Reactions  . Contrast Media [Iodinated Diagnostic Agents] Anaphylaxis and Shortness Of Breath    Needed to be defibrillated   . Augmentin [Amoxicillin-Pot Clavulanate] Other (See Comments)    "stomach hurt"  . Codeine Nausea And Vomiting    jittery  . Iohexol      Code: RASH, Desc: PT STATES SHE IS ALLERGIC TO IV CONTRAST 05/28/06/RM, Onset Date: [id number]   . Meperidine Hcl Nausea And Vomiting  . Morphine And Related Nausea And Vomiting    Patient Measurements: Height: 5\' 7"  (170.2 cm) Weight: 132 lb (59.875 kg) IBW/kg (Calculated) : 61.6   Vital Signs: Temp: 99.9 F (37.7 C) (09/08 1630) Temp Source: Oral (09/08 1630) BP: 116/72 mmHg (09/08 1350) Pulse Rate: 71 (09/08 1350) Intake/Output from previous day: 09/07 0701 - 09/08 0700 In: 480 [P.O.:480] Out: 2600 [Urine:2600] Intake/Output from this shift:    Labs:  Recent Labs  01/22/15 0420 01/23/15 0505 01/24/15 0409  WBC 7.6 7.2 8.0  HGB 11.3* 10.5* 10.8*  PLT 335 296 357  CREATININE 0.48 0.42* 0.36*   Estimated Creatinine Clearance: 85.7 mL/min (by C-G formula based on Cr of 0.36).  Recent Labs  01/24/15 1920  [place] 8*     Microbiology: Recent Results (from the past 720 hour(s))  Blood Culture (routine x 2)     Status: None   Collection Time: 01/19/15  8:59 PM  Result Value Ref Range Status   Specimen Description BLOOD RIGHT FOREARM  Final   Special Requests BOTTLES DRAWN AEROBIC AND ANAEROBIC 5CC  Final   Culture NO GROWTH 5 DAYS  Final   Report Status 01/24/2015 FINAL  Final  Blood Culture (routine x 2)     Status: None   Collection Time: 01/19/15  9:05 PM  Result Value Ref Range Status   Specimen Description BLOOD RIGHT HAND  Final   Special Requests BOTTLES DRAWN AEROBIC AND ANAEROBIC 5CC  Final   Culture NO GROWTH 5 DAYS  Final   Report Status 01/24/2015 FINAL  Final   Urine culture     Status: None   Collection Time: 01/19/15 10:02 PM  Result Value Ref Range Status   Specimen Description URINE, CLEAN CATCH  Final   Special Requests NONE  Final   Culture NO GROWTH 2 DAYS  Final   Report Status 01/21/2015 FINAL  Final  Culture, blood (routine x 2)     Status: None (Preliminary result)   Collection Time: 01/22/15 12:50 PM  Result Value Ref Range Status   Specimen Description BLOOD RIGHT ARM  Final   Special Requests BOTTLES DRAWN AEROBIC ONLY  5CC  Final   Culture NO GROWTH 2 DAYS  Final   Report Status PENDING  Incomplete  Culture, blood (routine x 2)     Status: None (Preliminary result)   Collection Time: 01/22/15 12:55 PM  Result Value Ref Range Status   Specimen Description BLOOD LEFT ARM  Final   Special Requests IN PEDIATRIC BOTTLE  1CC  Final   Culture NO GROWTH 2 DAYS  Final   Report Status PENDING  Incomplete    Anti-infectives    Start     Dose/Rate Route Frequency Ordered Stop   01/23/15 2000  vancomycin (VANCOCIN) 500 mg in sodium chloride 0.9 % 100 mL IVPB     500 mg 100 mL/hr over 60 Minutes Intravenous Every 8 hours 01/23/15 1048  01/23/15 1200  cefTRIAXone (ROCEPHIN) 1 g in dextrose 5 % 50 mL IVPB     1 g 100 mL/hr over 30 Minutes Intravenous Every 24 hours 01/23/15 1048     01/23/15 1200  vancomycin (VANCOCIN) IVPB 1000 mg/200 mL premix     1,000 mg 200 mL/hr over 60 Minutes Intravenous  Once 01/23/15 1048 01/23/15 1437   01/20/15 0600  piperacillin-tazobactam (ZOSYN) IVPB 3.375 g  Status:  Discontinued     3.375 g 12.5 mL/hr over 240 Minutes Intravenous 3 times per day 01/19/15 2335 01/20/15 0959   01/20/15 0600  vancomycin (VANCOCIN) 500 mg in sodium chloride 0.9 % 100 mL IVPB  Status:  Discontinued     500 mg 100 mL/hr over 60 Minutes Intravenous Every 8 hours 01/19/15 2335 01/20/15 0959   01/19/15 2045  piperacillin-tazobactam (ZOSYN) IVPB 3.375 g     3.375 g 100 mL/hr over 30 Minutes Intravenous  Once 01/19/15  2040 01/19/15 2224   01/19/15 2045  vancomycin (VANCOCIN) IVPB 1000 mg/200 mL premix     1,000 mg 200 mL/hr over 60 Minutes Intravenous  Once 01/19/15 2040 01/19/15 2224      Assessment: 44 yo female with fever with possible PNA/bactermia to begin rocephin and vancomycin. She was on vancomycin and zosyn previously for concern of HCAP. ID following and restarting antibiotics with recent fever. WBC= 7.2, tmax= 102.7, SCr= 0.42 and CrCl ~ 85.   Rocephin 9/7>> Vanc>> Vanc 9/3>>9/4 Zosyn 9/3>>9/3  9/6 blood x2 9/3 BCx: NGTD 9/3: urine: neg  VT is subtherapeutic at 8 on vancomycin 500mg  q8h. Tmax 99.9, WBC 8.0, sCr 0.36.  Goal of Therapy:  Vancomycin trough level 15-20 mcg/ml  Plan:  Increase vancomycin to 1g IV q8h -Rocephin 1gm IV q24hr -Will follow renal function, cultures and clinical progress  Andrey Cota. Diona Foley, PharmD Clinical Pharmacist Pager [phone] 01/24/2015 8:51 PM

## 2015-01-24 NOTE — Progress Notes (Signed)
Pt's HR has been as low 40 with pacing.  No V. Tach.

## 2015-01-24 NOTE — Progress Notes (Signed)
Family Medicine Teaching Service Daily Progress Note Intern Pager: [phone]  Patient name: [name] Medical record number: [id number] Date of birth: Jul 06, 1970 Age: 44 y.o. Gender: female  Primary Care Provider: Cordelia Poche, MD Consultants: cards, ID, pulmonology   Code Status: full  Pt Overview and Major Events to Date:  9/3 - admitted with prolonged palpitations and low grade fever 9/5 - CT chest spectrum of findings related to sarcoidosis and dilation of pulmonic trunk likely related to [facility]  9/6-ICD interogated; Pulm and ID consulted   Assessment and Plan: [name] is a 44 y.o. female presenting with sensation of racing heart and fever at home to 102 . PMH is significant for Long QT, history of torsades, AICD in place, DM2, Sarcoidosis  Fevers: Patient was febrile to 100.7 on admission with lactate to 5.37 (chronically elevated per chart review).  On CT abd pelvis, small focal lesions seen in lower lung fields concerning for atypical pneumonia per radiology read. She was started on Vanc/Zosyn for concern for HCAP. Her abx were stopped and her illness was attributed to sarcoid, as supported by chest CT. Patient remained afebrile for over 24 hours after d/c of abx; however, began to spike fevers on the afternoon of 9/5. Due to continued intermittent fever spikes, repeat blood cultures were ordered and ID was consulted. Per ID, her syndrome could be attributed to an atypical mycobacterial PNA. Pulmonology was also consulted for possible bronchoscopy.  - WBC 7.2 (9/7) -> 8.0 (9/8) - Lactic acid 4.1 (9/4) -> 1.2 (9/8) - Repeat Bcx from 9/6 show NG x 1 day - appreciate ID recs >>> could tx with quinolone or macrolide but hesitant 2/2 to patient's prolonged QT interval; recommend restarting vanco/ceftriazone   - appreciate Pulm recs >> feel that this could be progression of sarcoidosis but an infectious etiology needs to be ruled out; will get bronchoscope with BAL of R lower lobe  today at 0800; will do transbronchial biopsies if she can tolerate    Cardiac: prolonged QTc to 545 on presentation, with significant history of prolonged QTc, torsades de pointes, SVT with subsequent AICD placement. Last echo 6/13 at [facility] LV EF => 70%, borderline left ventricular hypertrophy, else largely normal. Troponin negative x3. ICD device interogated on 9/6 and working properly.  - Cardiology consulted, appreciate recs >> increase propanolol if needed; recommend macrolides and quinolones be avoided if at all possible; prefer azithromycin if abx nommend macrolides and quinolones be avoided if at all posseeded  - Telemetry  - K 4.5 (9/7) -> 3.3 (9/8); has been taking 37mq K-DUR; will give an additional 40 mEq K-DUR today.   Sarcoid/Obstructive chronic bronchitis: Stable per last Pulmonology assessment on 01/07/2015, current presentation concerning for worsening disease status - ESR 96, CRP 13.2 (unclear whether this represents a significant change from her baseline chronic inflammatory condition) - continue home Qvar - per pulm recs, may be have progression of sarcoid, will get bronchoscope today  DM2: Last A1c 6.3 (Aug 2016); CBGs over last 24 hours as low as 45. Lantus decreased from 35u -> 25u (9/6) -> 20u (9/8) - Will d/c Lantus today, as she has had multiple low blood sugars - SSI - CBG ACHS  Weight loss/Anorexia: Approximately 55 lb weight loss from 12/2013 to 01/20/2015 associated with decreased appetite and early satiety. Extensive work up by GI with EGD, CT, [place] and gastric emptying study noted to be unremarkable in terms of causes of weight loss. Potentially secondary to sardoidosis, and  decreased PO intake. CT Abd did show hepatosplenomegaly with possible gastric varices.  - Nutrition consult --> Ensure Enlive between meals and magic cup with meals   Elevated Alk phos: Alk phos 674, elevated since 09/2014. GHTT on 01/15/2015 elevated to 372, remarkable for  intrahepatic cause of elevation. Normal bilirubin and liver transaminases, recent and [place] negative for liver or gall bladder pathology. Negative Hep B and Hep C serologies. Concerning for sarcoid involvement to liver vs other intra hepatic cause  - consider GI consult vs outpt f/u  Hepatosplenomegaly: persistent since being noted on 10/25/2014, now with concern for gastric varices and nodular liver edge concerning for cirrhosis. ? Possible amyloidosis given elevated ALP, previously elevated T protein even with low albumin - consider GI consult vs outpt f/u - SPEP/UPEP pending  - CRP 13.2, ESR 96 - Iron studies: ferritin 932 c/w chronic inflammation  - GI recently placed future orders for ANA, IgG, Mitochondrial & Smooth muscle Antibodies; consider obtaining while inpatient  FEN/GI: Carb/heart, SLIV Prophylaxis: Lovenox  Disposition: home pending clinical improvement and cardiology eval  Subjective:  Overnight, had an oral temp of 103.1 around 0000 and then a rectal temp of 92.1 at 0648 and 0730. She also had a blood sugar to 45, so Lantus was held last night. She states that she is feeling stressed and is trying to keep her spirits up. She thinks she'll feel better after she gets some answers about what's going on with her.  Objective: Temp:  [97.9 F (36.6 C)-103.1 F (39.5 C)] 97.9 F (36.6 C) (09/08 0222) Pulse Rate:  [41-87] 41 (09/08 0441) Resp:  [18] 18 (09/08 0441) BP: (117-128)/(64-83) 125/81 mmHg (09/08 0441) SpO2:  [97 %-100 %] 98 % (09/08 0441) Physical Exam: General: Thin-appearing female, sitting up on the side of the bed, in NAD HEENT: [place]/AT, EOMI, MMM Cardiovascular: RRR, no murmurs Respiratory: Coarse breath sounds, but no wheezes or crackles, normal work of breathing Abdomen: soft, diffusely tender to palpation, no rebound, +BS Skin: No rashes or lesion Neuro: No focal deficits Psych: normal mood and affect  Laboratory:  Recent Labs Lab 01/22/15 0420  01/23/15 0505 01/24/15 0409  WBC 7.6 7.2 8.0  HGB 11.3* 10.5* 10.8*  HCT 35.4* 32.1* 33.1*  PLT 335 296 357    Recent Labs Lab 01/19/15 2059 01/20/15 0935  01/22/15 0420 01/23/15 0505 01/24/15 0409  NA 128* 130*  < > 137 131* 132*  K 3.2* 3.8  < > 4.0 4.5 3.3*  CL 89* 96*  < > 101 97* 98*  CO2 26 27  < > 28 26 28   BUN <5* <5*  < > 5* <5* <5*  CREATININE 0.69 0.55  < > 0.48 0.42* 0.36*  CALCIUM 8.3* 8.1*  < > 9.1 8.6* 8.7*  PROT 7.8 7.7  --   --   --   --   BILITOT 0.4 0.5  --   --   --   --   ALKPHOS 674* 609*  --   --   --   --   ALT 12* 12*  --   --   --   --   AST 44* 35  --   --   --   --   GLUCOSE 158* 131*  < > 76 78 49*  < > = values in this interval not displayed. Lactic acid 5.37->4.37->4.1 (chronically elevated) ESR 96 CRP 13.2  Imaging/Diagnostic Tests: 9/3 CT abd/pelvis  1. Diffuse tiny nodular opacities within the right  lower lung lobe and right middle lung lobe raise suspicion for atypical pneumonia. This is new from the prior study. 2. Splenomegaly and hepatomegaly again noted. 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 Apparent gastric varices suggested, not well seen without contrast.  9/3 CXR No active cardiopulmonary disease.  Sela Hua, MD 01/24/2015, 6:44 AM PGY-1, [place] Intern pager: [phone], text pages welcome

## 2015-01-24 NOTE — Progress Notes (Signed)
INFECTIOUS DISEASE PROGRESS NOTE  ID: [name] is a 44 y.o. female with  Active Problems:   Diabetes mellitus, type 2   Essential hypertension   SYNDROME, LONG QT   PSVT   OBSTRUCTIVE CHRONIC BRONCHITIS   Palpitations   Automatic implantable cardioverter-defibrillator in situ   Elevated alkaline phosphatase level   Hypokalemia   HCAP (healthcare-associated pneumonia)  Subjective: Confused, sedated post bronch  Abtx:  Anti-infectives    Start     Dose/Rate Route Frequency Ordered Stop   01/23/15 2000  vancomycin (VANCOCIN) 500 mg in sodium chloride 0.9 % 100 mL IVPB     500 mg 100 mL/hr over 60 Minutes Intravenous Every 8 hours 01/23/15 1048     01/23/15 1200  cefTRIAXone (ROCEPHIN) 1 g in dextrose 5 % 50 mL IVPB     1 g 100 mL/hr over 30 Minutes Intravenous Every 24 hours 01/23/15 1048     01/23/15 1200  vancomycin (VANCOCIN) IVPB 1000 mg/200 mL premix     1,000 mg 200 mL/hr over 60 Minutes Intravenous  Once 01/23/15 1048 01/23/15 1437   01/20/15 0600  piperacillin-tazobactam (ZOSYN) IVPB 3.375 g  Status:  Discontinued     3.375 g 12.5 mL/hr over 240 Minutes Intravenous 3 times per day 01/19/15 2335 01/20/15 0959   01/20/15 0600  vancomycin (VANCOCIN) 500 mg in sodium chloride 0.9 % 100 mL IVPB  Status:  Discontinued     500 mg 100 mL/hr over 60 Minutes Intravenous Every 8 hours 01/19/15 2335 01/20/15 0959   01/19/15 2045  piperacillin-tazobactam (ZOSYN) IVPB 3.375 g     3.375 g 100 mL/hr over 30 Minutes Intravenous  Once 01/19/15 2040 01/19/15 2224   01/19/15 2045  vancomycin (VANCOCIN) IVPB 1000 mg/200 mL premix     1,000 mg 200 mL/hr over 60 Minutes Intravenous  Once 01/19/15 2040 01/19/15 2224      Medications:  Scheduled: . budesonide (PULMICORT) nebulizer solution  0.25 mg Nebulization BID  . butamben-tetracaine-benzocaine  1 spray Topical Once  . cefTRIAXone (ROCEPHIN)  IV  1 g Intravenous Q24H  . cycloSPORINE  1 drop Both Eyes BID  . enoxaparin  (LOVENOX) injection  40 mg Subcutaneous Daily  . feeding supplement (ENSURE ENLIVE)  237 mL Oral TID BM  . insulin aspart  0-9 Units Subcutaneous TID WC  . insulin glargine  20 Units Subcutaneous QHS  . lidocaine  1 application Topical Once  . Linaclotide  145 mcg Oral Daily  . olopatadine  1 drop Both Eyes BID  . pantoprazole  40 mg Oral Q0600  . potassium chloride SA  20 mEq Oral Daily  . propranolol  80 mg Oral BID  . sodium chloride  3 mL Intravenous Q12H  . vancomycin  500 mg Intravenous Q8H    Objective: Vital signs in last 24 hours: Temp:  [92.1 F (33.4 C)-103.1 F (39.5 C)] 92.1 F (33.4 C) (09/08 0730) Pulse Rate:  [41-87] 58 (09/08 0905) Resp:  [14-28] 18 (09/08 0905) BP: (94-129)/(62-85) 98/65 mmHg (09/08 0905) SpO2:  [86 %-100 %] 94 % (09/08 0905) Weight:  [59.875 kg (132 lb)] 59.875 kg (132 lb) (09/08 0730)   General appearance: fatigued and no distress Resp: clear to auscultation bilaterally Cardio: regular rate and rhythm GI: normal findings: bowel sounds normal and soft, non-tender  Lab Results  Recent Labs  01/23/15 0505 01/24/15 0409  WBC 7.2 8.0  HGB 10.5* 10.8*  HCT 32.1* 33.1*  NA 131* 132*  K 4.5  3.3*  CL 97* 98*  CO2 26 28  BUN <5* <5*  CREATININE 0.42* 0.36*   Liver Panel No results for input(s): PROT, ALBUMIN, AST, ALT, ALKPHOS, BILITOT, BILIDIR, IBILI in the last 72 hours. Sedimentation Rate No results for input(s): ESRSEDRATE in the last 72 hours. C-Reactive Protein No results for input(s): CRP in the last 72 hours.  Microbiology: Recent Results (from the past 240 hour(s))  Blood Culture (routine x 2)     Status: None (Preliminary result)   Collection Time: 01/19/15  8:59 PM  Result Value Ref Range Status   Specimen Description BLOOD RIGHT FOREARM  Final   Special Requests BOTTLES DRAWN AEROBIC AND ANAEROBIC 5CC  Final   Culture NO GROWTH 4 DAYS  Final   Report Status PENDING  Incomplete  Blood Culture (routine x 2)      Status: None (Preliminary result)   Collection Time: 01/19/15  9:05 PM  Result Value Ref Range Status   Specimen Description BLOOD RIGHT HAND  Final   Special Requests BOTTLES DRAWN AEROBIC AND ANAEROBIC 5CC  Final   Culture NO GROWTH 4 DAYS  Final   Report Status PENDING  Incomplete  Urine culture     Status: None   Collection Time: 01/19/15 10:02 PM  Result Value Ref Range Status   Specimen Description URINE, CLEAN CATCH  Final   Special Requests NONE  Final   Culture NO GROWTH 2 DAYS  Final   Report Status 01/21/2015 FINAL  Final  Culture, blood (routine x 2)     Status: None (Preliminary result)   Collection Time: 01/22/15 12:50 PM  Result Value Ref Range Status   Specimen Description BLOOD RIGHT ARM  Final   Special Requests BOTTLES DRAWN AEROBIC ONLY  5CC  Final   Culture NO GROWTH 1 DAY  Final   Report Status PENDING  Incomplete  Culture, blood (routine x 2)     Status: None (Preliminary result)   Collection Time: 01/22/15 12:55 PM  Result Value Ref Range Status   Specimen Description BLOOD LEFT ARM  Final   Special Requests IN PEDIATRIC BOTTLE  1CC  Final   Culture NO GROWTH 1 DAY  Final   Report Status PENDING  Incomplete    Studies/Results: No results found.   Assessment/Plan: Fever Sarcoid DM2 AICD for long QT syndrome  Total days of antibiotics: off  Would: BAL, bronch this Am Await bcx Appreciate cv input, hold macrolide/quinolone ACE level quite elevated Appreciate CCM, could consider steroid burst if BAL non-purulent Could add doxy for atypicals         Bobby Rumpf Infectious Diseases (pager) [phone] [email or web address] 01/24/2015, 9:14 AM  LOS: 4 days

## 2015-01-24 NOTE — Progress Notes (Signed)
UR Completed. Hatcher Froning, RN, BSN.  [phone] 

## 2015-01-24 NOTE — Progress Notes (Signed)
Pt feeling fevered achy, temp99.1, tylenol given per orders, will continue to monitor closely. Edward Qualia RN

## 2015-01-25 DIAGNOSIS — IMO0001 Reserved for inherently not codable concepts without codable children: Secondary | ICD-10-CM

## 2015-01-25 LAB — GLUCOSE, CAPILLARY
Glucose-Capillary: 76 mg/dL (ref 65–99)
Glucose-Capillary: 86 mg/dL (ref 65–99)
Glucose-Capillary: 73 mg/dL (ref 65–99)
Glucose-Capillary: 163 mg/dL — ABNORMAL HIGH (ref 65–99)

## 2015-01-25 LAB — BASIC METABOLIC PANEL
Anion gap: 11 (ref 5–15)
CHLORIDE: 96 mmol/L — AB (ref 101–111)
CO2: 22 mmol/L (ref 22–32)
Calcium: 8.2 mg/dL — ABNORMAL LOW (ref 8.9–10.3)
Creatinine, Ser: 0.6 mg/dL (ref 0.44–1.00)
GFR calc Af Amer: >60 mL/min (ref >60)
GFR calc non Af Amer: >60 mL/min (ref >60)
GLUCOSE: 148 mg/dL — AB (ref 65–99)
POTASSIUM: 4 mmol/L (ref 3.5–5.1)
Sodium: 129 mmol/L — ABNORMAL LOW (ref 135–145)

## 2015-01-25 LAB — CBC
HEMATOCRIT: 31.1 % — AB (ref 36.0–46.0)
HEMOGLOBIN: 10.2 g/dL — AB (ref 12.0–15.0)
MCH: 28.4 pg (ref 26.0–34.0)
MCHC: 32.8 g/dL (ref 30.0–36.0)
MCV: 86.6 fL (ref 78.0–100.0)
PLATELETS: 330 10*3/uL (ref 150–400)
RBC: 3.59 MIL/uL — AB (ref 3.87–5.11)
RDW: 14.4 % (ref 11.5–15.5)
WBC: 8.8 10*3/uL (ref 4.0–10.5)

## 2015-01-25 MED ORDER — POTASSIUM CHLORIDE CRYS ER 20 MEQ PO TBCR
20.0000 meq | EXTENDED_RELEASE_TABLET | Freq: Every day | ORAL | Status: DC
  Administered 2015-01-25: 20 meq via ORAL
  Filled 2015-01-25: qty 1

## 2015-01-25 MED ORDER — ONDANSETRON HCL 4 MG PO TABS
4.0000 mg | ORAL_TABLET | Freq: Two times a day (BID) | ORAL | Status: DC
  Administered 2015-01-25 – 2015-01-29 (×9): 4 mg via ORAL
  Filled 2015-01-25 (×14): qty 1

## 2015-01-25 MED ORDER — PREDNISONE 50 MG PO TABS
60.0000 mg | ORAL_TABLET | Freq: Every day | ORAL | Status: DC
  Administered 2015-01-25 – 2015-01-26 (×2): 60 mg via ORAL
  Filled 2015-01-25 (×3): qty 1

## 2015-01-25 MED ORDER — HYDROCORTISONE ACETATE 25 MG RE SUPP
25.0000 mg | Freq: Two times a day (BID) | RECTAL | Status: AC
  Filled 2015-01-25 (×6): qty 1

## 2015-01-25 MED ORDER — POTASSIUM CHLORIDE CRYS ER 20 MEQ PO TBCR
40.0000 meq | EXTENDED_RELEASE_TABLET | Freq: Every day | ORAL | Status: DC
  Administered 2015-01-26 – 2015-01-29 (×4): 40 meq via ORAL
  Filled 2015-01-25 (×6): qty 2

## 2015-01-25 MED ORDER — DEXTROSE 5 % IV SOLN
100.0000 mg | Freq: Two times a day (BID) | INTRAVENOUS | Status: DC
  Administered 2015-01-25 – 2015-01-26 (×3): 100 mg via INTRAVENOUS
  Filled 2015-01-25 (×5): qty 100

## 2015-01-25 NOTE — Consult Note (Signed)
[facility] Consult: 9:48 AM 01/25/2015  LOS: 5 days    Referring Provider: Dr Gwendlyn Deutscher  Primary Care Physician:  Cordelia Poche, MD Primary Gastroenterologist:  Dr. Deatra Ina    Reason for Consultation:   fobt +.    HPI: [name] is a 44 y.o. female.  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  Long QT syndrome, s/p ICD. SVT, s/p ablation. Sarcoidosis.  Stable pulmonary nodules. IBS-C treated with Amitiza.  Fibromyalgia and peripheral neuropathy.   03/2010 flexible sigmoidoscopy with band ligation of internal hemorrhoids. 04/2009 flexible sigmoidoscopy for rectal bleeding. Study revealed internal hemorrhoids. 01/2008 EGD or dysphagia and reflux symptoms. Distal esophageal stricture was dilated, Bravo pH probe placed. 11/2004 colonoscopy for history constipation. Normal study to cecum.. 01/2004 EGD. For reflux symptoms. Noted distal esophagitis and distal esophageal stricture which was dilated.   Since the early part of 2016, in January/February the patient has had issues with nausea, vomiting and diffuse upper abdominal pain. She's lost at least 40 pounds. Labs show a steady increase of alkaline phosphatase. It was 417 on 10/03/2014, 508 on 10/22/2014,  570 on 11/22/14. Transaminases and total bilirubin has been normal with slight increase T bili to 1.5 in 11/2014.  10/08/2014 abdominal ultrasound showed no gallbladder or hepatobiliary abnormality. Increased renal echo texture possibly reflecting medical renal disease. 10/24/14 Upper endoscopy: Normal 10/25/14 oral contrast only CT abdomen pelvis: Probable mild hepatosplenomegaly. Enlarged uterus containing large left leiomyoma.  Dr. Deatra Ina suggested gastric emptying study 11/06/14 TAH/BSO, for dysfunctional uterine bleeding and enlarging fibroid, at [facility].  Returned to  surgery with ex lap and hematoma evacuation within a few hours due to hypotension and acute drop Hgb to 3. Transfuse "7" units blood and 2 FFP according to discharge summary.  11/21/17 inpt GI eval for ongoing n/v.  Not compliant with Nexium due to cost also taking narcotics. Admission due to syncopal episodes, toursades which triggered ICD to fire noted on device interrogation.  12/03/14 Gastric emptying study normal.  Advised to take daily PPI (generic fine) and stop narcotics.   At GI OV 12/11/14: Linzess initiated in place of Amitiza. Alk phos 1047 on 7/21.  GGT elevated at 372, 5' nucleotidase elevated at 14.  Outpt IgG, ANA, AMA, smooth muscle Ab ordered but never drawn Rx for Zofran phoned in for n/v on 8/29.    Now admitted fever, new  lung nodules suspicious for atypical PNA.  Repeat chest CT suggest changes are likely sarcoid in nature  9/3 non-contrast Abd/pelvic CT with persistent hepatosplenomegaly, nodular liver concerning for cirrhosis and "apparent" gastric varices.   LFTs trended to 609 on 9/4.  Transaminases with Max AST 44 on 9/3, albumin perpetually low at 1.8 currently.  Otherwise LFTs normal.  Acute hepatitis serologies negative.  Somewhat anemic with HGb 10.2, baseline ~ 11.  Coags normal.   She had a stool on 9/6. On 988 she had a Brown's, formed stool which was mixed with blood. A second stool later was just some clots of blood but no real stool. No associated acute  abdominal pain though with the hepatosplenomegaly, she has had some upper abdominal discomfort. She continues with her chronic nausea and intermittent vomiting, no bloody emesis or coffee ground emesis. Stool is FOBT +  Despite the switch to Linzess 2 months ago, she still has some constipation and generally moves her bowels every 3 days, formed stools. She has seen blood with bowel movements before but it had been several months since previous last occurrence. Appetite somewhat improved although it varies from  day-to-day. She ate lunch and dinner well yesterday. Today she feels too tired to eat her breakfast.   Past Medical History  Diagnosis Date  . Long Q-T syndrome   . Palpitation   . Seizure disorder     none recently  . Abdominal pain, periumbilic   . Esophageal stricture 2005/2009    esophageal strictures dilated 2005, 2009  . Sarcoidosis   . Allergic rhinitis   . Bronchitis   . GERD (gastroesophageal reflux disease) 2005  . Asthma   . Fibromyalgia   . Stroke     QUESTIONABLE TIA   . Neuropathy in diabetes   . Seizures     HISTORY OF TAKING KLONIPIN NOT RX AT THE MOMENT  . Cancer     Nodules in lungs pt believes were cancerous pt unsure  . Arthritis   . Internal hemorrhoids 2010    2011 band ligation of int rrhoids.  . Eye hemorrhage     bilateral  . Fibroid, uterine   . Automatic implantable cardioverter-defibrillator in situ 2006, replaced 2008    MEDTRONIC  . DM (diabetes mellitus) 1997/1998    type 1, initially gestational but soon after child birth developed IDDM  . History of blood transfusion years ago    Past Surgical History  Procedure Laterality Date  . Tubal ligation    . Hand surgery    . Svt ablation    . Automatic implantable cardiac defibrillator situ  2006,     ICD-Medtronic   Remote - Yes   . Hemorrhoid banding  03/2010  . Dilitation & currettage/hystroscopy with hydrothermal ablation N/A 06/07/2013    Procedure: DILATATION & CURETTAGE/HYSTEROSCOPY WITH attempted HYDROTHERMAL ABLATION;  Surgeon: Frederico Hamman, MD;  Location: [facility];  Service: Gynecology;  Laterality: N/A;  . Esophagogastroduodenoscopy (egd) with propofol N/A 10/24/2014    Procedure: ESOPHAGOGASTRODUODENOSCOPY (EGD) WITH PROPOFOL;  Surgeon: Inda Castle, MD;  Location: [facility];  Service: Endoscopy;  Laterality: N/A;  . Total abdominal hysterectomy w/ bilateral salpingoophorectomy  11/06/14    at [facility] for menorrhagia, pelvic pain and enlarging uterine fibroids  . Exploratory  laparotomy  11/06/14    evacuation of post TAH/BSO hematoma  . Abdominal hysterectomy    . Salpingectomy    . Cervix removal      Prior to Admission medications   Medication Sig Start Date End Date Taking? Authorizing Provider  acetaminophen (TYLENOL) 500 MG tablet Take 1,000 mg by mouth every 6 (six) hours as needed for mild pain or fever.   Yes Historical Provider, MD  atenolol (TENORMIN) 50 MG tablet Take 1 tablet (50 mg total) by mouth 2 (two) times daily. 11/26/14  Yes Evans Lance, MD  beclomethasone (QVAR) 80 MCG/ACT inhaler Inhale 2 puffs into the lungs 2 (two) times daily. 09/05/14  Yes Elsie Stain, MD  clonazePAM (KLONOPIN) 0.5 MG tablet Take 1 tablet (0.5 mg total) by mouth daily. 12/24/14  Yes Mariel Aloe, MD  cyclobenzaprine (FLEXERIL) 10 MG tablet Take 10 mg  by mouth 3 (three) times daily as needed for muscle spasms.    Yes Historical Provider, MD  cycloSPORINE (RESTASIS) 0.05 % ophthalmic emulsion Place 1 drop into both eyes 2 (two) times daily.    Yes Historical Provider, MD  fluticasone (FLONASE) 50 MCG/ACT nasal spray Place 2 sprays into both nostrils daily as needed for allergies.    Yes Historical Provider, MD  furosemide (LASIX) 20 MG tablet Take 1 tablet (20 mg total) by mouth daily. 12/10/14  Yes Evans Lance, MD  gabapentin (NEURONTIN) 100 MG capsule Take 1 capsule (100 mg total) by mouth 3 (three) times daily. 06/29/14  Yes Tanda Rockers, MD  glimepiride (AMARYL) 2 MG tablet Take 2 mg by mouth 2 (two) times daily at 8 am and 10 pm.    Yes Historical Provider, MD  LANTUS SOLOSTAR 100 UNIT/ML Solostar Pen Inject 35 Units into the skin at bedtime.   Yes Historical Provider, MD  Linaclotide Rolan Lipa) 145 MCG CAPS capsule Take 1 capsule (145 mcg total) by mouth daily. 12/11/14  Yes Jessica D Zehr, PA-C  meclizine (ANTIVERT) 25 MG tablet Take 25 mg by mouth 3 (three) times daily as needed for dizziness (take 1/2 to 1 tablet by mouth as needed for dizziness).   Yes  Historical Provider, MD  NOVOLOG FLEXPEN 100 UNIT/ML FlexPen Inject 4-8 Units into the skin 3 (three) times daily.   Yes Historical Provider, MD  Olopatadine HCl (PATADAY) 0.2 % SOLN Place 1 drop into both eyes 2 (two) times daily.    Yes Historical Provider, MD  ondansetron (ZOFRAN) 4 MG tablet Take 1 tablet (4 mg total) by mouth every 6 (six) hours as needed for nausea or vomiting. 01/15/15  Yes Gatha Mayer, MD  pantoprazole (PROTONIX) 40 MG tablet Take 1 tablet (40 mg total) by mouth daily at 6 (six) AM. 11/24/14  Yes Rhonda G Barrett, PA-C  potassium chloride SA (K-DUR,KLOR-CON) 20 MEQ tablet Take 1 tablet (20 mEq total) by mouth daily. 11/24/14  Yes Rhonda G Barrett, PA-C    Scheduled Meds: . budesonide (PULMICORT) nebulizer solution  0.25 mg Nebulization BID  . butamben-tetracaine-benzocaine  1 spray Topical Once  . cefTRIAXone (ROCEPHIN)  IV  1 g Intravenous Q24H  . cycloSPORINE  1 drop Both Eyes BID  . doxycycline (VIBRAMYCIN) IV  100 mg Intravenous Q12H  . feeding supplement (ENSURE ENLIVE)  237 mL Oral TID BM  . insulin aspart  0-9 Units Subcutaneous TID WC  . lidocaine  1 application Topical Once  . Linaclotide  145 mcg Oral Daily  . olopatadine  1 drop Both Eyes BID  . pantoprazole  40 mg Oral Q0600  . potassium chloride SA  20 mEq Oral Daily  . propranolol  80 mg Oral BID  . sodium chloride  3 mL Intravenous Q12H  . vancomycin  1,000 mg Intravenous Q8H   Infusions: . sodium chloride 1,000 mL (01/24/15 1833)  . sodium chloride 10 mL/hr at 01/24/15 0750   PRN Meds: acetaminophen, albuterol, benzonatate, dextromethorphan, fluticasone, ondansetron (ZOFRAN) IV, phenylephrine, traMADol   Allergies as of 01/19/2015 - Review Complete 01/19/2015  Allergen Reaction Noted  . Contrast media [iodinated diagnostic agents] Anaphylaxis and Shortness Of Breath 05/30/2013  . Augmentin [amoxicillin-pot clavulanate] Other (See Comments) 10/23/2014  . Codeine Nausea And Vomiting   .  Iohexol  05/28/2006  . Meperidine hcl Nausea And Vomiting   . Morphine and related Nausea And Vomiting 05/30/2013    Family History  Problem Relation  Age of Onset  . Cancer    . Diabetes      Social History   Social History  . Marital Status: Single    Spouse Name: N/A  . Number of Children: N/A  . Years of Education: N/A   Occupational History  . Not on file.   Social History Main Topics  . Smoking status: Never Smoker   . Smokeless tobacco: Never Used  . Alcohol Use: No     Comment: occ.  . Drug Use: No  . Sexual Activity: No   Other Topics Concern  . Not on file   Social History Narrative    REVIEW OF SYSTEMS: Constitutional:   Weight is stable over the last month. ENT: no nose bleeds Pulm:  non productive cough CV:  No palpitations, no LE edema.  GU:  No hematuria, no frequency GI:  Per HPI.  Still has dermatologic numbness in the lower abdomen following her hysterectomy. Heme:  No unusual or excessive bruising.    Transfusions:  yes at [facility] earlier this summer after her TAH/BSO.  Neuro:  No headaches, no peripheral tingling or numbness Derm:  No itching, no rash or sores.  Endocrine:  No sweats or chills.  No polyuria or dysuria Immunization: Did not inquireve Travel:  None beyond local counties in last few months.    PHYSICAL EXAM: Vital signs in last 24 hours: Filed Vitals:   01/25/15 0439  BP: 109/71  Pulse: 76  Temp: 98.6 F (37 C)  Resp: 18   Wt Readings from Last 3 Encounters:  01/24/15 132 lb (59.875 kg)  01/07/15 132 lb (59.875 kg)  12/24/14 132 lb 6.4 oz (60.056 kg)    General:  Thin, malnourished, chronically ill looking. Alert, comfortable. Head:  No swelling or asymmetry. No signs of head trauma.  Eyes:  No scleral icterus, no conjunctival pallor. Ears:  No hearing deficit  Nose:  No congestion or discharge. Mouth:  Clear, moist oral mucous membranes. Neck:  No JVD, no masses, no TMG. Lungs:  Clear to auscultation  and percussion bilaterally. Slight occasional dry cough. No dyspnea. Heart:  RRR. No MRG. S1/S2 audible. Defibrillator present beneath skin at upper left chest. Abdomen:  Soft, not distended. Bowel sounds active. Hepatomegaly. Slight bilateral upper quadrant tenderness. No guarding or rebound..   Rectal:  Patient preferred that I not do digital exam but visibly she has some external hemorrhoidal tags, no fresh blood, no thrombosis.   Musc/Skeltl:  Muscular wasting of the limbs. Extremities:  No CCE.  Neurologic:  Oriented 3. Slight upper extremity tremor. Moves all 4 limbs, strength not tested. Skin:  No telangiectasia, rash or sores. Tattoos:  None seen. Nodes:  No cervical or inguinal adenopathy.   Psych:  Pleasant, somewhat depressed. Cooperative and engaged.  Intake/Output from previous day: 09/08 0701 - 09/09 0700 In: 120 [P.O.:120] Out: 1501 [Urine:1500; Stool:1] Intake/Output this shift:    LAB RESULTS:  Recent Labs  01/23/15 0505 01/24/15 0409 01/25/15 0332  WBC 7.2 8.0 8.8  HGB 10.5* 10.8* 10.2*  HCT 32.1* 33.1* 31.1*  PLT 296 357 330  M CV    86   BMET Lab Results  Component Value Date   NA 129* 01/25/2015   NA 132* 01/24/2015   NA 131* 01/23/2015   K 4.0 01/25/2015   K 3.3* 01/24/2015   K 4.5 01/23/2015   CL 96* 01/25/2015   CL 98* 01/24/2015   CL 97* 01/23/2015   CO2 22 01/25/2015  CO2 28 01/24/2015   CO2 26 01/23/2015   GLUCOSE 148* 01/25/2015   GLUCOSE 49* 01/24/2015   GLUCOSE 78 01/23/2015   BUN <5* 01/25/2015   BUN <5* 01/24/2015   BUN <5* 01/23/2015   CREATININE 0.60 01/25/2015   CREATININE 0.36* 01/24/2015   CREATININE 0.42* 01/23/2015   CALCIUM 8.2* 01/25/2015   CALCIUM 8.7* 01/24/2015   CALCIUM 8.6* 01/23/2015   LFT No results for input(s): PROT, ALBUMIN, AST, ALT, ALKPHOS, BILITOT, BILIDIR, IBILI in the last 72 hours. PT/INR Lab Results  Component Value Date   INR 1.24 01/23/2015   INR 1.0 ratio 01/06/2010   INR 1.04 03/01/2009    Hepatitis Panel No results for input(s): HEPBSAG, HCVAB, HEPAIGM, HEPBIGM in the last 72 hours. C-Diff No components found for: CDIFF Lipase     Component Value Date/Time   LIPASE 44 01/19/2015 2059    Drugs of Abuse     Component Value Date/Time   LABOPIA NONE DETECTED 12/09/2008 0351   COCAINSCRNUR NONE DETECTED 12/09/2008 0351   LABBENZ NONE DETECTED 12/09/2008 0351   AMPHETMU NONE DETECTED 12/09/2008 0351   THCU NONE DETECTED 12/09/2008 0351   LABBARB  12/09/2008 0351    NONE DETECTED        DRUG SCREEN FOR MEDICAL PURPOSES ONLY.  IF CONFIRMATION IS NEEDED FOR ANY PURPOSE, NOTIFY LAB WITHIN 5 DAYS.        LOWEST DETECTABLE LIMITS FOR URINE DRUG SCREEN Drug Class       Cutoff (ng/mL) Amphetamine      1000 Barbiturate      200 Benzodiazepine   161 Tricyclics       096 Opiates          300 Cocaine          300 THC              50     RADIOLOGY STUDIES: No results found.  ENDOSCOPIC STUDIES: Per HPI  IMPRESSION:   *  Minor to moderate bleeding per rectum. This may be related to her chronic constipation. She does have some nonthrombosed external hemorrhoids and history of internal hemorrhoids status post band ligation in 2011, these could be source for bleeding.. Normal colonoscopy in 2006.  *   normocytic anemia, essentially stable.   *  Many months history of elevated alkaline phosphatase with multiple studies. GGT and 5' nucleotidase elevated. Autoimmune serologies have yet to be collected. CT scan is now showing the persistent hepatosplenomegaly but no new findings of nodular liver and possible gastric varices.  Upper abdominal discomfort may be a result of the hepatosplenomegaly.  *  Fever, elevated lactate, sepsis.  Currently being treated for atypical pneumonia including mycobacterial. Lung changes on CT may represent atypical pneumonia and or sarcoid. Bronchoscopy performed and BAL sample sent for cell count, cultures, AFB smear, cytology. Continues on  vancomycin.   *  Status post ICD for history of prolonged QT syndrome. Troponins negative during this admission. EP consult today and seeing no current rhythm issues.   *  Long standing insulin-dependent diabetes.    PLAN:     *  Per MD.   *  We'll add a few days of Anusol suppositories.  *  The autoimmune markers IgG, ANA, smooth muscle antibody, mitochondrial antibody has been ordered and to be collected today.   Azucena Freed  01/25/2015, 9:48 AM Pager: [phone]  ________________________________________________________________________  Velora Heckler GI MD note:  I personally examined the patient, reviewed the data and agree with  the assessment and plan described above.  Her rectal bleeding seems pretty minimal, likely hemorrhoid related.  Would treat empirically with steroid suppositories as above.  Given acute pulm process, fevers I do not think any invasive GI testing should be done at this point. Her N/V are chronic.  Unclear etiology.  I usually prefer daily, scheduled antiemetics in situations like this and will switch her PRN zofran to BID scheduled.  Known sarcoidosis, this is classic to cause elevated alk phos and I think we can assume the alk phos is from hepatic sarcoidosis (as long as none of the other serologies, tests ordered above show no other etiology).  I would not recommend liver biopsy.  Will follow along.   Owens Loffler, MD [facility] Pager [phone]

## 2015-01-25 NOTE — Progress Notes (Signed)
Family Medicine Teaching Service Daily Progress Note Intern Pager: [phone]  Patient name: [name] Medical record number: [id number] Date of birth: 05-22-1970 Age: 44 y.o. Gender: female  Primary Care Provider: Cordelia Poche, MD Consultants: cards, ID, pulmonology   Code Status: full  Pt Overview and Major Events to Date:  9/3 - admitted with prolonged palpitations and low grade fever 9/5 - CT chest spectrum of findings related to sarcoidosis and dilation of pulmonic trunk likely related to [facility]  9/6-ICD interogated; Pulm and ID consulted  9/7-Vancomycin and Ceftriaxone started  9/8-Bronchoscopy performed   Assessment and Plan: [name] is a 44 y.o. female presenting with sensation of racing heart and fever at home to 102 . PMH is significant for Long QT, history of torsades, AICD in place, DM2, Sarcoidosis  Fevers: Patient was febrile to 100.7 on admission with lactate to 5.37 (chronically elevated per chart review).  On CT abd pelvis, small focal lesions seen in lower lung fields concerning for atypical pneumonia per radiology read. She was started on Vanc/Zosyn for concern for HCAP. Her abx were stopped and her illness was attributed to sarcoid, as supported by chest CT. Patient remained afebrile for over 24 hours after d/c of abx; however, began to spike fevers on the afternoon of 9/5. Due to continued intermittent fever spikes, repeat blood cultures were ordered and ID was consulted. Per ID, her syndrome could be attributed to an atypical mycobacterial PNA. Pulmonology was also consulted for bronchoscopy.  - WBC 7.2 (9/7) -> 8.0 (9/8) --> 8.8 (9/9) - Lactic acid 4.1 (9/4) -> 1.2 (9/8) - Repeat Bcx from 9/6 show NG x 2 day - appreciate ID recs >>add doxycycline  - appreciate Pulm recs >> Bronchoscopy performed with right lower lobe BAL, no hemorrhage noted, transbronchial biopsies not done 2/2 to desat to mid 80s -BAL sent for cell count, cultures, AFB smear and cultures,  cytology and flow  -pharmacy increased vancomycin dose from 592m IV q8h to 1g IV q8h 2/2 to subtherapeutic vanc levels    Cardiac: prolonged QTc to 545 on presentation, with significant history of prolonged QTc, torsades de pointes, SVT with subsequent AICD placement. [facility]%, borderline left ventricular hypertrophy, else largely normal. Troponin negative x3. ICD device interogated on 9/6 and working properly.  - Cardiology consulted, appreciate recs >> no current rhythm issues  - Telemetry  - K 4.5 (9/7) -> 3.3 (9/8)-->4.0 (9/9); has been taking 227m K-DUR; will increase to 4032mK-DUR daily   Lupus: Patient mentioned that she was diagnosed with Lupus "many years ago", believes around 2005. Never given any treatment and was unable to follow up due to "being blown off by the doctors". Not currently followed by rheumatologist. No record of lupus diagnosis in EMR.  -consider rheumatology followup outpatient  -consider ANA and antiphospholipid antibodies; future orders pending by GI   Rectal Bleeding: Patient complained of bright red blood in stool x4 episodes. FOBT positive. States that she has had bleeding in past but doesn't remember results of work up. -GI consulted    Sarcoid/Obstructive chronic bronchitis: Stable per last Pulmonology assessment on 01/07/2015, current presentation concerning for worsening disease status - ESR 96, CRP 13.2 (unclear whether this represents a significant change from her baseline chronic inflammatory condition) - continue home Qvar - per pulm recs, may be have progression of sarcoid, will get bronchoscope today  DM2: Last A1c 6.3 (Aug 2016); CBGs have remained low. Lantus was d/c  on 9/8. - SSI - CBG ACHS  Weight loss/Anorexia: Approximately 55 lb weight loss from 12/2013 to 01/20/2015 associated with decreased appetite and early satiety. Extensive work up by GI with EGD, CT, US abdomen and gastric emptying study noted to be  unremarkable in terms of causes of weight loss. Potentially secondary to sardoidosis, and decreased PO intake. CT Abd did show hepatosplenomegaly with possible gastric varices.  - Nutrition consult --> Ensure Enlive between meals and magic cup with meals   Elevated Alk phos: Alk phos 674, elevated since 09/2014. GHTT on 01/15/2015 elevated to 372, remarkable for intrahepatic cause of elevation. Normal bilirubin and liver transaminases, recent and [place] negative for liver or gall bladder pathology. Negative Hep B and Hep C serologies. Concerning for sarcoid involvement to liver vs other intra hepatic cause  - consider GI consult vs outpt f/u  Hepatosplenomegaly: persistent since being noted on 10/25/2014, now with concern for gastric varices and nodular liver edge concerning for cirrhosis. ? Possible amyloidosis given elevated ALP, previously elevated T protein even with low albumin - consider GI consult vs outpt f/u - SPEP/UPEP pending  - CRP 13.2, ESR 96 - Iron studies: ferritin 932 c/w chronic inflammation  - GI recently placed future orders for ANA, IgG, Mitochondrial & Smooth muscle Antibodies; consider obtaining while inpatient  FEN/GI: Carb/heart, SLIV Prophylaxis: Lovenox  Disposition: home pending clinical improvement and cardiology eval  Subjective:  Overnight continued to have fevers up to 102.2. Complaining of being cold at present and of body aches. States her appetite is diminished but that she is trying to eat.   Objective: Temp:  [92.1 F (33.4 C)-102.2 F (39 C)] 98.6 F (37 C) (09/09 0439) Pulse Rate:  [48-88] 76 (09/09 0439) Resp:  [14-28] 18 (09/09 0439) BP: (94-129)/(62-85) 109/71 mmHg (09/09 0439) SpO2:  [86 %-100 %] 94 % (09/09 0439) Weight:  [132 lb (59.875 kg)] 132 lb (59.875 kg) (09/08 0730) Physical Exam: General: Thin-appearing female, sitting up on the side of the bed, in NAD HEENT: [place]/AT, EOMI, MMM Cardiovascular: RRR, no murmurs Respiratory: Coarse breath  sounds, but no wheezes or crackles, normal work of breathing Abdomen: soft, diffusely tender to palpation, no rebound, +BS Skin: No rashes or lesion Neuro: No focal deficits Psych: normal mood and affect  Laboratory:  Recent Labs Lab 01/23/15 0505 01/24/15 0409 01/25/15 0332  WBC 7.2 8.0 8.8  HGB 10.5* 10.8* 10.2*  HCT 32.1* 33.1* 31.1*  PLT 296 357 330    Recent Labs Lab 01/19/15 2059 01/20/15 0935  01/23/15 0505 01/24/15 0409 01/25/15 0332  NA 128* 130*  < > 131* 132* 129*  K 3.2* 3.8  < > 4.5 3.3* 4.0  CL 89* 96*  < > 97* 98* 96*  CO2 26 27  < > $R'26 28 22  'OU$ BUN <5* <5*  < > <5* <5* <5*  CREATININE 0.69 0.55  < > 0.42* 0.36* 0.60  CALCIUM 8.3* 8.1*  < > 8.6* 8.7* 8.2*  PROT 7.8 7.7  --   --   --   --   BILITOT 0.4 0.5  --   --   --   --   ALKPHOS 674* 609*  --   --   --   --   ALT 12* 12*  --   --   --   --   AST 44* 35  --   --   --   --   GLUCOSE 158* 131*  < > 78 49* 148*  < > =  values in this interval not displayed. Lactic acid 5.37->4.37->4.1 (chronically elevated) ESR 96 CRP 13.2  Imaging/Diagnostic Tests: 9/3 CT abd/pelvis  1. Diffuse tiny nodular opacities within the right lower lung lobe and right middle lung lobe raise suspicion for atypical pneumonia. This is new from the prior study. 2. Splenomegaly and hepatomegaly again noted. 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 Apparent gastric varices suggested, not well seen without contrast.  9/3 CXR No active cardiopulmonary disease.  Nicolette Bang, DO 01/25/2015, 7:29 AM PGY-1, [place] Intern pager: [phone], text pages welcome

## 2015-01-25 NOTE — Progress Notes (Signed)
INFECTIOUS DISEASE PROGRESS NOTE  ID: [name] is a 44 y.o. female with  Active Problems:   Diabetes mellitus, type 2   Essential hypertension   SYNDROME, LONG QT   PSVT   OBSTRUCTIVE CHRONIC BRONCHITIS   Palpitations   Automatic implantable cardioverter-defibrillator in situ   Elevated alkaline phosphatase level   Hypokalemia   HCAP (healthcare-associated pneumonia)  Subjective: Feels poorly, weak, no CP, occas cough.   Abtx:  Anti-infectives    Start     Dose/Rate Route Frequency Ordered Stop   01/25/15 0100  vancomycin (VANCOCIN) IVPB 1000 mg/200 mL premix     1,000 mg 200 mL/hr over 60 Minutes Intravenous Every 8 hours 01/24/15 2057     01/23/15 2000  vancomycin (VANCOCIN) 500 mg in sodium chloride 0.9 % 100 mL IVPB  Status:  Discontinued     500 mg 100 mL/hr over 60 Minutes Intravenous Every 8 hours 01/23/15 1048 01/24/15 2057   01/23/15 1200  cefTRIAXone (ROCEPHIN) 1 g in dextrose 5 % 50 mL IVPB     1 g 100 mL/hr over 30 Minutes Intravenous Every 24 hours 01/23/15 1048     01/23/15 1200  vancomycin (VANCOCIN) IVPB 1000 mg/200 mL premix     1,000 mg 200 mL/hr over 60 Minutes Intravenous  Once 01/23/15 1048 01/23/15 1437   01/20/15 0600  piperacillin-tazobactam (ZOSYN) IVPB 3.375 g  Status:  Discontinued     3.375 g 12.5 mL/hr over 240 Minutes Intravenous 3 times per day 01/19/15 2335 01/20/15 0959   01/20/15 0600  vancomycin (VANCOCIN) 500 mg in sodium chloride 0.9 % 100 mL IVPB  Status:  Discontinued     500 mg 100 mL/hr over 60 Minutes Intravenous Every 8 hours 01/19/15 2335 01/20/15 0959   01/19/15 2045  piperacillin-tazobactam (ZOSYN) IVPB 3.375 g     3.375 g 100 mL/hr over 30 Minutes Intravenous  Once 01/19/15 2040 01/19/15 2224   01/19/15 2045  vancomycin (VANCOCIN) IVPB 1000 mg/200 mL premix     1,000 mg 200 mL/hr over 60 Minutes Intravenous  Once 01/19/15 2040 01/19/15 2224      Medications:  Scheduled: . budesonide (PULMICORT) nebulizer  solution  0.25 mg Nebulization BID  . butamben-tetracaine-benzocaine  1 spray Topical Once  . cefTRIAXone (ROCEPHIN)  IV  1 g Intravenous Q24H  . cycloSPORINE  1 drop Both Eyes BID  . feeding supplement (ENSURE ENLIVE)  237 mL Oral TID BM  . insulin aspart  0-9 Units Subcutaneous TID WC  . lidocaine  1 application Topical Once  . Linaclotide  145 mcg Oral Daily  . olopatadine  1 drop Both Eyes BID  . pantoprazole  40 mg Oral Q0600  . potassium chloride SA  20 mEq Oral Daily  . propranolol  80 mg Oral BID  . sodium chloride  3 mL Intravenous Q12H  . vancomycin  1,000 mg Intravenous Q8H    Objective: Vital signs in last 24 hours: Temp:  [97.5 F (36.4 C)-102.2 F (39 C)] 98.6 F (37 C) (09/09 0439) Pulse Rate:  [55-88] 76 (09/09 0439) Resp:  [18] 18 (09/09 0439) BP: (96-120)/(67-74) 109/71 mmHg (09/09 0439) SpO2:  [94 %-99 %] 94 % (09/09 0439)   General appearance: alert, cooperative, fatigued and no distress Resp: clear to auscultation bilaterally Cardio: regular rate and rhythm GI: normal findings: bowel sounds normal and soft, non-tender  Lab Results  Recent Labs  01/24/15 0409 01/25/15 0332  WBC 8.0 8.8  HGB 10.8* 10.2*  HCT 33.1* 31.1*  NA 132* 129*  K 3.3* 4.0  CL 98* 96*  CO2 28 22  BUN <5* <5*  CREATININE 0.36* 0.60   Liver Panel No results for input(s): PROT, ALBUMIN, AST, ALT, ALKPHOS, BILITOT, BILIDIR, IBILI in the last 72 hours. Sedimentation Rate No results for input(s): ESRSEDRATE in the last 72 hours. C-Reactive Protein No results for input(s): CRP in the last 72 hours.  Microbiology: Recent Results (from the past 240 hour(s))  Blood Culture (routine x 2)     Status: None   Collection Time: 01/19/15  8:59 PM  Result Value Ref Range Status   Specimen Description BLOOD RIGHT FOREARM  Final   Special Requests BOTTLES DRAWN AEROBIC AND ANAEROBIC 5CC  Final   Culture NO GROWTH 5 DAYS  Final   Report Status 01/24/2015 FINAL  Final  Blood Culture  (routine x 2)     Status: None   Collection Time: 01/19/15  9:05 PM  Result Value Ref Range Status   Specimen Description BLOOD RIGHT HAND  Final   Special Requests BOTTLES DRAWN AEROBIC AND ANAEROBIC 5CC  Final   Culture NO GROWTH 5 DAYS  Final   Report Status 01/24/2015 FINAL  Final  Urine culture     Status: None   Collection Time: 01/19/15 10:02 PM  Result Value Ref Range Status   Specimen Description URINE, CLEAN CATCH  Final   Special Requests NONE  Final   Culture NO GROWTH 2 DAYS  Final   Report Status 01/21/2015 FINAL  Final  Culture, blood (routine x 2)     Status: None (Preliminary result)   Collection Time: 01/22/15 12:50 PM  Result Value Ref Range Status   Specimen Description BLOOD RIGHT ARM  Final   Special Requests BOTTLES DRAWN AEROBIC ONLY  5CC  Final   Culture NO GROWTH 2 DAYS  Final   Report Status PENDING  Incomplete  Culture, blood (routine x 2)     Status: None (Preliminary result)   Collection Time: 01/22/15 12:55 PM  Result Value Ref Range Status   Specimen Description BLOOD LEFT ARM  Final   Special Requests IN PEDIATRIC BOTTLE  1CC  Final   Culture NO GROWTH 2 DAYS  Final   Report Status PENDING  Incomplete    Studies/Results: No results found.   Assessment/Plan: Fever Sarcoid DM2 AICD for long QT syndrome  Total days of antibiotics: 3 vanco/ceftriaxone  Bronch, bx, bal yesterday- no results Await these studies prior to change in anbx, starting steroids.  Will add doxy Will f/u over weekend Antipyretics Watch on tele (episodes of bradycardia)         Bobby Rumpf Infectious Diseases (pager) [phone] [email or web address] 01/25/2015, 9:15 AM  LOS: 5 days

## 2015-01-25 NOTE — Progress Notes (Signed)
Unable to get pt's temperature with 2 different temperature machines. Pt refused a rectal temperature. Will monitor pt closely. Pt denies feelings of chills or being hot.

## 2015-01-25 NOTE — Progress Notes (Signed)
Pt feeling hot in the room. Temperature was 102.2. Tylenol given. [place] sandwich and lemonade also given due to BS of 76. Will closely monitor pt.

## 2015-01-25 NOTE — Progress Notes (Signed)
Patient Name: [name]      SUBJECTIVE STILL FEELS SICK  Past Medical History  Diagnosis Date  . Long Q-T syndrome   . Palpitation   . Seizure disorder     none recently  . Abdominal pain, periumbilic   . Esophageal stricture 2005/2009    esophageal strictures dilated 2005, 2009  . Sarcoidosis   . Allergic rhinitis   . Bronchitis   . GERD (gastroesophageal reflux disease) 2005  . Asthma   . Fibromyalgia   . Stroke     QUESTIONABLE TIA   . Neuropathy in diabetes   . Seizures     HISTORY OF TAKING KLONIPIN NOT RX AT THE MOMENT  . Cancer     Nodules in lungs pt believes were cancerous pt unsure  . Arthritis   . Internal hemorrhoids 2010    2011 band ligation of int rrhoids.  . Eye hemorrhage     bilateral  . Fibroid, uterine   . Automatic implantable cardioverter-defibrillator in situ 2006, replaced 2008    MEDTRONIC  . DM (diabetes mellitus) 1997/1998    type 1, initially gestational but soon after child birth developed IDDM  . History of blood transfusion years ago    Scheduled Meds:  Scheduled Meds: . budesonide (PULMICORT) nebulizer solution  0.25 mg Nebulization BID  . butamben-tetracaine-benzocaine  1 spray Topical Once  . cefTRIAXone (ROCEPHIN)  IV  1 g Intravenous Q24H  . cycloSPORINE  1 drop Both Eyes BID  . feeding supplement (ENSURE ENLIVE)  237 mL Oral TID BM  . insulin aspart  0-9 Units Subcutaneous TID WC  . lidocaine  1 application Topical Once  . Linaclotide  145 mcg Oral Daily  . olopatadine  1 drop Both Eyes BID  . pantoprazole  40 mg Oral Q0600  . potassium chloride SA  20 mEq Oral Daily  . propranolol  80 mg Oral BID  . sodium chloride  3 mL Intravenous Q12H  . vancomycin  1,000 mg Intravenous Q8H   Continuous Infusions: . sodium chloride 1,000 mL (01/24/15 1833)  . sodium chloride 10 mL/hr at 01/24/15 0750   acetaminophen, albuterol, benzonatate, dextromethorphan, fluticasone, ondansetron (ZOFRAN) IV, phenylephrine,  traMADol    PHYSICAL EXAM Filed Vitals:   01/25/15 0120 01/25/15 0213 01/25/15 0303 01/25/15 0439  BP:  120/67  109/71  Pulse:  88  76  Temp: 102.2 F (39 C) 102 F (38.9 C) 99.2 F (37.3 C) 98.6 F (37 C)  TempSrc: Oral Oral Oral Oral  Resp:  18  18  Height:      Weight:      SpO2:  95%  94%    Well developed and nourished in no acute distress HENT normal Neck supple w    Regular rate and rhythm, n  No Clubbing cyanosis edema Skin-warm and dry A & Oriented  Grossly normal sensory and motor function   TELEMETRY: Reviewed telemetry pt in * sinus   Intake/Output Summary (Last 24 hours) at 01/25/15 0847 Last data filed at 01/25/15 0442  Gross per 24 hour  Intake    120 ml  Output   1501 ml  Net  -1381 ml    LABS: Basic Metabolic Panel:  Recent Labs Lab 01/19/15 2059 01/20/15 0335 01/20/15 0935 01/21/15 0400 01/22/15 0420 01/23/15 0505 01/24/15 0409 01/25/15 0332  NA 128*  --  130* 132* 137 131* 132* 129*  K 3.2*  --  3.8 3.4* 4.0 4.5  3.3* 4.0  CL 89*  --  96* 96* 101 97* 98* 96*  CO2 26  --  27 28 28 26 28 22   GLUCOSE 158*  --  131* 115* 76 78 49* 148*  BUN <5*  --  <5* <5* 5* <5* <5* <5*  CREATININE 0.69  --  0.55 0.41* 0.48 0.42* 0.36* 0.60  CALCIUM 8.3*  --  8.1* 8.6* 9.1 8.6* 8.7* 8.2*  MG  --  1.8  --   --  1.8  --   --   --    Cardiac Enzymes: No results for input(s): CKTOTAL, CKMB, CKMBINDEX, TROPONINI in the last 72 hours. CBC:  Recent Labs Lab 01/19/15 2059 01/20/15 0935 01/21/15 0400 01/22/15 0420 01/23/15 0505 01/24/15 0409 01/25/15 0332  WBC 7.7 7.5 7.1 7.6 7.2 8.0 8.8  NEUTROABS 4.4  --   --   --   --   --   --   HGB 11.4* 10.9* 10.9* 11.3* 10.5* 10.8* 10.2*  HCT 34.7* 33.7* 34.0* 35.4* 32.1* 33.1* 31.1*  MCV 85.9 86.6 87.6 86.6 85.6 85.5 86.6  PLT 326 331 345 335 296 357 330   PROTIME:  Recent Labs  01/23/15 1214  LABPROT 15.7*  INR 1.24   Liver Function Tests: No results for input(s): AST, ALT, ALKPHOS, BILITOT,  PROT, ALBUMIN in the last 72 hours. No results for input(s): LIPASE, AMYLASE in the last 72 hours. BNP: BNP (last 3 results) No results for input(s): BNP in the last 8760 hours.  ProBNP (last 3 results) No results for input(s): PROBNP in the last 8760 hours.  D-Dimer: No results for input(s): DDIMER in the last 72 hours. Hemoglobin A1C: No results for input(s): HGBA1C in the last 72 hours. Fasting Lipid Panel: No results for input(s): CHOL, HDL, LDLCALC, TRIG, CHOLHDL, LDLDIRECT in the last 72 hours. Thyroid Function Tests: No results for input(s): TSH, T4TOTAL, T3FREE, THYROIDAB in the last 72 hours.  Invalid input(s): FREET3 Anemia Panel:    ASSESSMENT AND PLAN:  Active Problems:   Diabetes mellitus, type 2   Essential hypertension   SYNDROME, LONG QT   PSVT   OBSTRUCTIVE CHRONIC BRONCHITIS   Palpitations   Automatic implantable cardioverter-defibrillator in situ   Elevated alkaline phosphatase level   Hypokalemia   HCAP (healthcare-associated pneumonia)  Fevers and workup ongoijng  Will need to keep in mind device as present-   No current rhtyhm issues    Signed, Virl Axe MD  01/25/2015

## 2015-01-25 NOTE — Consult Note (Signed)
Name: [name] MRN: [id number] DOB: [birth year]    ADMISSION DATE:  01/19/2015 CONSULTATION DATE:  01/22/15  REFERRING MD :  Gwendlyn Deutscher  CHIEF COMPLAINT:  Cough, fevers.  BRIEF PATIENT DESCRIPTION: 44 y.o. , African-American female with history of Stage III sarcoidosis and associated asthmatic bronchitis. Patient of Dr. Lyda Jester. Sarcoidosis not thought to be active and she was in systemic steroids for about a year in past but not since atleast 2008. Maintained on inhaled corticosteroids. She is also history of chest pain which has been evaluated thoroughly by cardiology and long QT syndrome.   SIGNIFICANT EVENTS  9/8 Bronchoscopy BAL of RLL  STUDIES:  CT chest (01/21/15) IMPRESSION: 1. There is a spectrum of findings in the thorax that is most likely related to the patient's history of sarcoidosis. Specifically, there is a perilymphatic distribution of multiple tiny subcentimeter pulmonary nodules, in addition to mediastinal lymphadenopathy. 2. Dilatation of the pulmonic trunk (3.6 cm in diameter), suggestive of pulmonary arterial hypertension. 3. Splenomegaly. 4. Probable hepatomegaly.   HISTORY OF PRESENT ILLNESS:  Presenting with fever and weakness and mild cough. States that she has cough at baseline. Cough right now is not very different from baseline. She had fevers with elevated lactic acid started on antibiotics for HCAP. She had a CT scan which shows multiple pulmonary nodules indicative of a door her sarcoidosis or an atypical infection. PCCM was consulted for bronchoscope.  PAST MEDICAL HISTORY :   has a past medical history of Long Q-T syndrome; Palpitation; Seizure disorder; Abdominal pain, periumbilic; Esophageal stricture (2005/2009); Sarcoidosis; Allergic rhinitis; Bronchitis; GERD (gastroesophageal reflux disease) (2005); Asthma; Fibromyalgia; Stroke; Neuropathy in diabetes; Seizures; Cancer; Arthritis; Internal hemorrhoids (2010); Eye hemorrhage; Fibroid, uterine;  Automatic implantable cardioverter-defibrillator in situ (2006, replaced 2008); DM (diabetes mellitus) (1997/1998); and History of blood transfusion (years ago).  has past surgical history that includes Tubal ligation; Hand surgery; SVT ablation; AUTOMATIC IMPLANTABLE CARDIAC DEFIBRILLATOR SITU (2006, ); Hemorrhoid banding (03/2010); Dilatation & currettage/hysteroscopy with hydrothermal ablation (N/A, 06/07/2013); Esophagogastroduodenoscopy (egd) with propofol (N/A, 10/24/2014); Total abdominal hysterectomy w/ bilateral salpingoophorectomy (11/06/14); Exploratory laparotomy (11/06/14); Abdominal hysterectomy; Salpingectomy; and Cervix removal. Prior to Admission medications   Medication Sig Start Date End Date Taking? Authorizing Provider  acetaminophen (TYLENOL) 500 MG tablet Take 1,000 mg by mouth every 6 (six) hours as needed for mild pain or fever.   Yes Historical Provider, MD  atenolol (TENORMIN) 50 MG tablet Take 1 tablet (50 mg total) by mouth 2 (two) times daily. 11/26/14  Yes Evans Lance, MD  beclomethasone (QVAR) 80 MCG/ACT inhaler Inhale 2 puffs into the lungs 2 (two) times daily. 09/05/14  Yes Elsie Stain, MD  clonazePAM (KLONOPIN) 0.5 MG tablet Take 1 tablet (0.5 mg total) by mouth daily. 12/24/14  Yes Mariel Aloe, MD  cyclobenzaprine (FLEXERIL) 10 MG tablet Take 10 mg by mouth 3 (three) times daily as needed for muscle spasms.    Yes Historical Provider, MD  cycloSPORINE (RESTASIS) 0.05 % ophthalmic emulsion Place 1 drop into both eyes 2 (two) times daily.    Yes Historical Provider, MD  fluticasone (FLONASE) 50 MCG/ACT nasal spray Place 2 sprays into both nostrils daily as needed for allergies.    Yes Historical Provider, MD  furosemide (LASIX) 20 MG tablet Take 1 tablet (20 mg total) by mouth daily. 12/10/14  Yes Evans Lance, MD  gabapentin (NEURONTIN) 100 MG capsule Take 1 capsule (100 mg total) by mouth 3 (three) times daily. 06/29/14  Yes Legrand Como  Mckinley Jewel, MD  glimepiride (AMARYL)  2 MG tablet Take 2 mg by mouth 2 (two) times daily at 8 am and 10 pm.    Yes Historical Provider, MD  LANTUS SOLOSTAR 100 UNIT/ML Solostar Pen Inject 35 Units into the skin at bedtime.   Yes Historical Provider, MD  Linaclotide Rolan Lipa) 145 MCG CAPS capsule Take 1 capsule (145 mcg total) by mouth daily. 12/11/14  Yes Jessica D Zehr, PA-C  meclizine (ANTIVERT) 25 MG tablet Take 25 mg by mouth 3 (three) times daily as needed for dizziness (take 1/2 to 1 tablet by mouth as needed for dizziness).   Yes Historical Provider, MD  NOVOLOG FLEXPEN 100 UNIT/ML FlexPen Inject 4-8 Units into the skin 3 (three) times daily.   Yes Historical Provider, MD  Olopatadine HCl (PATADAY) 0.2 % SOLN Place 1 drop into both eyes 2 (two) times daily.    Yes Historical Provider, MD  ondansetron (ZOFRAN) 4 MG tablet Take 1 tablet (4 mg total) by mouth every 6 (six) hours as needed for nausea or vomiting. 01/15/15  Yes Gatha Mayer, MD  pantoprazole (PROTONIX) 40 MG tablet Take 1 tablet (40 mg total) by mouth daily at 6 (six) AM. 11/24/14  Yes Rhonda G Barrett, PA-C  potassium chloride SA (K-DUR,KLOR-CON) 20 MEQ tablet Take 1 tablet (20 mEq total) by mouth daily. 11/24/14  Yes Rhonda G Barrett, PA-C   Allergies  Allergen Reactions  . Contrast Media [Iodinated Diagnostic Agents] Anaphylaxis and Shortness Of Breath    Needed to be defibrillated   . Augmentin [Amoxicillin-Pot Clavulanate] Other (See Comments)    "stomach hurt"  . Codeine Nausea And Vomiting    jittery  . Iohexol      Code: RASH, Desc: PT STATES SHE IS ALLERGIC TO IV CONTRAST 05/28/06/RM, Onset Date: [id number]   . Meperidine Hcl Nausea And Vomiting  . Morphine And Related Nausea And Vomiting    FAMILY HISTORY:  family history includes Cancer in an other family member; Diabetes in an other family member. SOCIAL HISTORY:  reports that she has never smoked. She has never used smokeless tobacco. She reports that she does not drink alcohol or use illicit  drugs.  REVIEW OF SYSTEMS:   Constitutional: Negative for fever, chills, weight loss, malaise/fatigue and diaphoresis.  HENT: Negative for hearing loss, ear pain, nosebleeds, congestion, sore throat, neck pain, tinnitus and ear discharge.   Eyes: Negative for blurred vision, double vision, photophobia, pain, discharge and redness.  Respiratory: Negative for cough, hemoptysis, sputum production, shortness of breath, wheezing and stridor.   Cardiovascular: Negative for chest pain, palpitations, orthopnea, claudication, leg swelling and PND.  Gastrointestinal: Negative for heartburn, nausea, vomiting, abdominal pain, diarrhea, constipation, blood in stool and melena.  Genitourinary: Negative for dysuria, urgency, frequency, hematuria and flank pain.  Musculoskeletal: Negative for myalgias, back pain, joint pain and falls.  Skin: Negative for itching and rash.  Neurological: Negative for dizziness, tingling, tremors, sensory change, speech change, focal weakness, seizures, loss of consciousness, weakness and headaches.  Endo/Heme/Allergies: Negative for environmental allergies and polydipsia. Does not bruise/bleed easily.  SUBJECTIVE:   VITAL SIGNS: Temp:  [98 F (36.7 C)-102.2 F (39 C)] 98.6 F (37 C) (09/09 0439) Pulse Rate:  [71-90] 90 (09/09 1244) Resp:  [18] 18 (09/09 0439) BP: (106-135)/(67-79) 135/79 mmHg (09/09 1244) SpO2:  [94 %-96 %] 94 % (09/09 0439)  PHYSICAL EXAMINATION: General:  Frail, ill appearing woman, no apparent distress Neuro:  No focal deficits HEENT:  PERRLA, Moist mucus  membranes. Cardiovascular:  RRR Lungs:  Basal crackles. No wheeze. Abdomen:  Distended, Soft, + BS Skin:  Intact   Recent Labs Lab 01/23/15 0505 01/24/15 0409 01/25/15 0332  NA 131* 132* 129*  K 4.5 3.3* 4.0  CL 97* 98* 96*  CO2 26 28 22   BUN <5* <5* <5*  CREATININE 0.42* 0.36* 0.60  GLUCOSE 78 49* 148*    Recent Labs Lab 01/23/15 0505 01/24/15 0409 01/25/15 0332  HGB 10.5*  10.8* 10.2*  HCT 32.1* 33.1* 31.1*  WBC 7.2 8.0 8.8  PLT 296 357 330   No results found.  ASSESSMENT / PLAN: 44 year-old with stage III sarcoidosis. Not on any immunosuppression. Now comes with fever, cough and CT scan findings of multiple pulmonary nodules. She has pulmonary nodules on her chest imaging as far back as 2003. But the most recent CAT scan shows more prominent nodules in the right lower lobe. This could be progression of her sarcoidosis but an infectious etiology needs to be ruled out. Patient is unable to produce sputum for culture  S/P  bronchoscope with BAL of right lower lobe. Awaiting the final results. If infection is ruled out we can consider starting on steroids,  Marshell Garfinkel MD [facility] Pager [phone] If no answer or after 3pm call: [phone] 01/25/2015, 1:30 PM

## 2015-01-26 ENCOUNTER — Inpatient Hospital Stay (HOSPITAL_COMMUNITY): Payer: Medicaid Other

## 2015-01-26 DIAGNOSIS — W19XXXA Unspecified fall, initial encounter: Secondary | ICD-10-CM | POA: Diagnosis not present

## 2015-01-26 LAB — BASIC METABOLIC PANEL
ANION GAP: 9 (ref 5–15)
BUN: 7 mg/dL (ref 6–20)
CHLORIDE: 98 mmol/L — AB (ref 101–111)
CO2: 25 mmol/L (ref 22–32)
Calcium: 8.7 mg/dL — ABNORMAL LOW (ref 8.9–10.3)
Creatinine, Ser: 0.53 mg/dL (ref 0.44–1.00)
Glucose, Bld: 298 mg/dL — ABNORMAL HIGH (ref 65–99)
POTASSIUM: 3.7 mmol/L (ref 3.5–5.1)
SODIUM: 132 mmol/L — AB (ref 135–145)

## 2015-01-26 LAB — GLUCOSE, CAPILLARY
GLUCOSE-CAPILLARY: 229 mg/dL — AB (ref 65–99)
GLUCOSE-CAPILLARY: 244 mg/dL — AB (ref 65–99)
GLUCOSE-CAPILLARY: 289 mg/dL — AB (ref 65–99)
Glucose-Capillary: 335 mg/dL — ABNORMAL HIGH (ref 65–99)
Glucose-Capillary: 190 mg/dL — ABNORMAL HIGH (ref 65–99)

## 2015-01-26 LAB — CBC
HCT: 30.9 % — ABNORMAL LOW (ref 36.0–46.0)
HEMOGLOBIN: 10.3 g/dL — AB (ref 12.0–15.0)
MCH: 28.9 pg (ref 26.0–34.0)
MCHC: 33.3 g/dL (ref 30.0–36.0)
MCV: 86.8 fL (ref 78.0–100.0)
PLATELETS: 311 10*3/uL (ref 150–400)
RBC: 3.56 MIL/uL — AB (ref 3.87–5.11)
RDW: 14.2 % (ref 11.5–15.5)
WBC: 5 10*3/uL (ref 4.0–10.5)

## 2015-01-26 LAB — ANTINUCLEAR ANTIBODIES, IFA: ANTINUCLEAR ANTIBODIES, IFA: NEGATIVE

## 2015-01-26 MED ORDER — METHYLPREDNISOLONE SODIUM SUCC 125 MG IJ SOLR
60.0000 mg | Freq: Four times a day (QID) | INTRAMUSCULAR | Status: DC
  Administered 2015-01-26 – 2015-01-28 (×8): 60 mg via INTRAVENOUS
  Filled 2015-01-26 (×2): qty 0.96
  Filled 2015-01-26 (×3): qty 2
  Filled 2015-01-26 (×2): qty 0.96
  Filled 2015-01-26 (×2): qty 2
  Filled 2015-01-26 (×4): qty 0.96

## 2015-01-26 MED ORDER — INSULIN GLARGINE 100 UNIT/ML ~~LOC~~ SOLN
15.0000 [IU] | Freq: Every day | SUBCUTANEOUS | Status: DC
  Administered 2015-01-26 – 2015-01-27 (×2): 15 [IU] via SUBCUTANEOUS
  Filled 2015-01-26 (×2): qty 0.15

## 2015-01-26 NOTE — Progress Notes (Signed)
EP to see as needed going forward  Please call with questions  Thompson Grayer MD, [facility] 01/26/2015 7:26 AM

## 2015-01-26 NOTE — Progress Notes (Addendum)
Pt had an unwitnessed fall. Pt stated that she was on the bedside commode and was getting back into bed but she got dizzy and was not able to make it to the bed in time.  She fell flat on her buttocks. She denied hitting her head or any other body parts. Dr. Heber [place] has been notified. Orders for a pelvic x-ray has been order.  Pt's bed alarm is now set. She knows to ring out for help.  Will monitor pt closely. Pt is alert, orient, vital signs stable. Pt does not want me to call her family members at this time to inform them of the fall.

## 2015-01-26 NOTE — Progress Notes (Signed)
Family Medicine Teaching Service Daily Progress Note Intern Pager: [phone]  Patient name: [name] Medical record number: [id number] Date of birth: [birth year] Age: 44 y.o. Gender: female  Primary Care Provider: Cordelia Poche, MD Consultants: cards, ID, pulmonology   Code Status: full  Pt Overview and Major Events to Date:  9/3 - admitted with prolonged palpitations and low grade fever 9/5 - CT chest spectrum of findings related to sarcoidosis and dilation of pulmonic trunk likely related to [facility]  9/6 - ICD interogated; Pulm and ID consulted  9/7 - Vancomycin and Ceftriaxone started  9/8 - Bronchoscopy performed  9/9 - Started on PO pred and doxycycline. Fell while transferring from bedside commode to bed. 9/10 - Vanc/ceftriaxone/doxy d/c'd and PO pred switched to IV Solu-Medrol  Assessment and Plan: [name] is a 44 y.o. female presenting with sensation of racing heart and fever at home to 102. PMH is significant for Long QT, history of torsades, AICD in place, DM2, Sarcoidosis  Fevers secondary sarcoidosis: Patient was febrile to 100.7 on admission with lactate to 5.37.  On CT abd pelvis, small focal lesions seen in lower lung fields concerning for atypical pneumonia per radiology read. Initially on Vanc/Zosyn which were stopped as her illness was attributed to sarcoid, as supported by chest CT. She began to spike fevers on the afternoon of 9/5. S/p bronchoscopy 9/8 with right lower lobe BAL, no hemorrhage noted, transbronchial biopsies not done 2/2 to desat to mid 80s. Lactic acidosis resolved 9/8. Prednisone started 9/9 and switched to IV Solu-Medrol 9/10. Started on doxycycline 9/9. Last fever yesterday morning to 101.1. Leukocytosis resolved. - Repeat Bcx from 9/6 NGTD - ID following, appreciate recommendations. Continuing doxycycline. - Pulm following, appreciate recommendations. IV Solu-Medrol 31m Q6hr and d/c PO prednisone. Vancomycin, ceftriaxone and doxy  discontinued. Consider FOB repeat and transbronchial bx next week.  -BAL cytology with inflammatory cells and no malignancy. BAL AFB cx, fungus cx negative to date. Legionella and pneumocystis pending. - Echo pending to evaluate for pulm HTN  Fall, dizziness: No LOC or head trauma. Resolved this morning. - Orthostatic vitals - Continue tele - XR hip pending  Prolonged QTc with history of torsades de pointes, SVT s/p AICD in 2006: No hypokalemia - EP to see as needed - Telemetry  - Continue 480m K-DUR daily   Lupus: Diagnosed per patient in 2005 with no follow up or treatment.  - consider rheumatology followup outpatient  - IgG, mitochondrial antibodies, ANA, Anti smooth muscle antibodies pending  Rectal Bleeding secondary hemorrhoids: Hemoglobin stable and hemodynamically stable. - GI following, appreciate recommendations - Anusol suppositories  Obstructive chronic bronchitis:  - Budesonide neb 0.2528mID  DM2: Last A1c 6.19 Dec 2014. CBG 244-335 - Lantus 15u QAM - SSI - CBG AC/HS  Weight loss/Anorexia: Approximately 55 lb weight loss from 12/2013 to 01/20/2015 associated with decreased appetite and early satiety. Extensive work up by GI with EGD, CT, US [place] and gastric emptying study noted to be unremarkable in terms of causes of weight loss. Potentially secondary to sardoidosis, and decreased PO intake.  - Ensure Enlive between meals and magic cup with meals   Elevated Alk phos: Alk phos 674, elevated since 09/2014. GHTT on 01/15/2015 elevated to 372. Normal bilirubin and liver transaminases. US [place] for liver or gall bladder pathology. Negative Hep B and Hep C serologies.  - GI following, appreciate recommendations - Likely secondary to sarcoidosis  Hepatosplenomegaly: persistent since being noted on 10/25/2014, now with concern for gastric  varices and nodular liver edge concerning for cirrhosis. ? Possible amyloidosis given elevated ALP, previously elevated T protein even  with low albumin. No monoclonal protein abnormality. SPEP nonspecific. Iron studies with elevated ferritin - increased as an acute phase reactant.  - IgG, mitochondrial antibodies, ANA, Anti smooth muscle antibodies pending  FEN/GI: Carb modified diet Prophylaxis: Lovenox  Disposition: home pending clinical improvement and further workup  Subjective:  Fell overnight upon transferring from bedside commode to bed. Reported some dizziness prior to fall. No fevers overnight. She feels better compared to when she came in but feels she is not back to her baseline. Denies CP, SOB, N/V, diarrhea. No blood per rectum in last 24 hours. Tolerating diet.   Objective: Temp:  [95.2 F (35.1 C)-101.1 F (38.4 C)] 95.2 F (35.1 C) (09/10 0436) Pulse Rate:  [67-91] 68 (09/10 0532) Resp:  [16-19] 18 (09/10 0532) BP: (103-135)/(61-79) 107/69 mmHg (09/10 0532) SpO2:  [94 %-99 %] 98 % (09/10 0532) Physical Exam: General: Thin-appearing female, sitting up on the side of the bed, in NAD HEENT: [place]/AT, EOMI, MMM Cardiovascular: RRR, no murmurs Respiratory: Coarse breath sounds, but no wheezes or crackles, normal work of breathing Abdomen: soft, diffusely tender to palpation, no rebound, +BS Skin: No rashes or lesion Neuro: No focal deficits Psych: normal mood and affect  Laboratory:  Recent Labs Lab 01/24/15 0409 01/25/15 0332 01/26/15 0317  WBC 8.0 8.8 5.0  HGB 10.8* 10.2* 10.3*  HCT 33.1* 31.1* 30.9*  PLT 357 330 311    Recent Labs Lab 01/19/15 2059 01/20/15 0935  01/24/15 0409 01/25/15 0332 01/26/15 0317  NA 128* 130*  < > 132* 129* 132*  K 3.2* 3.8  < > 3.3* 4.0 3.7  CL 89* 96*  < > 98* 96* 98*  CO2 26 27  < > 28 22 25   BUN <5* <5*  < > <5* <5* 7  CREATININE 0.69 0.55  < > 0.36* 0.60 0.53  CALCIUM 8.3* 8.1*  < > 8.7* 8.2* 8.7*  PROT 7.8 7.7  --   --   --   --   BILITOT 0.4 0.5  --   --   --   --   ALKPHOS 674* 609*  --   --   --   --   ALT 12* 12*  --   --   --   --   AST 44*  35  --   --   --   --   GLUCOSE 158* 131*  < > 49* 148* 298*  < > = values in this interval not displayed.  Imaging/Diagnostic Tests: 9/3 CT abd/pelvis  1. Diffuse tiny nodular opacities within the right lower lung lobe and right middle lung lobe raise suspicion for atypical pneumonia. This is new from the prior study. 2. Splenomegaly and hepatomegaly again noted. 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 Apparent gastric varices suggested, not well seen without contrast.  9/3 CXR No active cardiopulmonary disease.  Milagros Loll, MD 01/26/2015, 8:05 AM PGY-2, [place] Intern pager: [phone], text pages welcome

## 2015-01-26 NOTE — Progress Notes (Signed)
[facility] Progress Note    Since last GI note: Eating well.  Zofran changed to scheduled bid dosing yesterday.  Objective: Vital signs in last 24 hours: Temp:  [95.2 F (35.1 C)-101.1 F (38.4 C)] 95.2 F (35.1 C) (09/10 0436) Pulse Rate:  [67-91] 68 (09/10 0532) Resp:  [16-19] 18 (09/10 0532) BP: (103-135)/(61-79) 107/69 mmHg (09/10 0532) SpO2:  [94 %-99 %] 98 % (09/10 0532) Last BM Date: 01/25/15 General: alert and oriented times 3 Heart: regular rate and rythm Abdomen: soft, non-tender, non-distended, normal bowel sounds   Lab Results:  Recent Labs  01/24/15 0409 01/25/15 0332 01/26/15 0317  WBC 8.0 8.8 5.0  HGB 10.8* 10.2* 10.3*  PLT 357 330 311  MCV 85.5 86.6 86.8    Recent Labs  01/24/15 0409 01/25/15 0332 01/26/15 0317  NA 132* 129* 132*  K 3.3* 4.0 3.7  CL 98* 96* 98*  CO2 28 22 25   GLUCOSE 49* 148* 298*  BUN <5* <5* 7  CREATININE 0.36* 0.60 0.53  CALCIUM 8.7* 8.2* 8.7*   No results for input(s): PROT, ALBUMIN, AST, ALT, ALKPHOS, BILITOT, BILIDIR, IBILI in the last 72 hours.  Recent Labs  01/23/15 1214  INR 1.24    Medications: Scheduled Meds: . budesonide (PULMICORT) nebulizer solution  0.25 mg Nebulization BID  . butamben-tetracaine-benzocaine  1 spray Topical Once  . cefTRIAXone (ROCEPHIN)  IV  1 g Intravenous Q24H  . cycloSPORINE  1 drop Both Eyes BID  . doxycycline (VIBRAMYCIN) IV  100 mg Intravenous Q12H  . feeding supplement (ENSURE ENLIVE)  237 mL Oral TID BM  . hydrocortisone  25 mg Rectal BID  . insulin aspart  0-9 Units Subcutaneous TID WC  . insulin glargine  15 Units Subcutaneous Q breakfast  . lidocaine  1 application Topical Once  . Linaclotide  145 mcg Oral Daily  . methylPREDNISolone (SOLU-MEDROL) injection  60 mg Intravenous Q6H  . olopatadine  1 drop Both Eyes BID  . ondansetron  4 mg Oral Q12H  . pantoprazole  40 mg Oral Q0600  . potassium chloride SA  40 mEq Oral Daily  . propranolol  80 mg Oral  BID  . sodium chloride  3 mL Intravenous Q12H   Continuous Infusions: . sodium chloride 1,000 mL (01/26/15 0121)  . sodium chloride 10 mL/hr at 01/24/15 0750   PRN Meds:.acetaminophen, albuterol, benzonatate, dextromethorphan, fluticasone, phenylephrine, traMADol    Assessment/Plan: 44 y.o. female with chronic n/v, elevated isolated alk phos, hepato/splenomegaly  Her n/v are improved with twice daily, scheduled Zofran dosing. Should continue that. She's had CT, EGD and no specific etiology noted but I suspect it is related to her comorbidities, chronic illness.  Hepatosplenomegaly is not uncommon for sarcoidosis involvement and the most common lab abnormality with hepatic sarcoid is isolated elevated alk phos.  I don't think liver biopsy is needed, would presume she does have infiltrative sarcoidosis involving liver +/- spleen.  ACE level elevated, atypical pulm findings being worked up and so far no infectious etiology has been noted.  Pulmonary plans to resume immunosupression for her if indeed infection is ruled out as best as possible.  Please call or page with any further questions or concerns. She should follow up with Dr. Deatra Ina as outpatient in 6-8 weeks after discharge.  Would repeat LFTs at that point to see if alk phos responds to sarcoid treatment.   Milus Banister, MD  01/26/2015, 9:02 AM [facility] Pager [phone]

## 2015-01-26 NOTE — Progress Notes (Signed)
Pt had a BM during shift; stools were formed, hard greenish looking but commode also had some small bright red blood with small clots noted. Will closely monitor pt. Francis Gaines Anapaola Kinsel RN.

## 2015-01-26 NOTE — Progress Notes (Signed)
Name: [name] MRN: [id number] DOB: Apr 24, 1971    ADMISSION DATE:  01/19/2015 CONSULTATION DATE:  01/22/15  REFERRING MD :  Gwendlyn Deutscher  CHIEF COMPLAINT:  Cough, fevers.  BRIEF PATIENT DESCRIPTION: 44 y.o. , African-American female with history of Stage III sarcoidosis and associated asthmatic bronchitis. Patient of Dr. Lyda Jester. Sarcoidosis not thought to be active and she was in systemic steroids for about a year in past but not since atleast 2008. Maintained on inhaled corticosteroids. She is also history of chest pain which has been evaluated thoroughly by cardiology and long QT syndrome.   SIGNIFICANT EVENTS  9/8 Bronchoscopy BAL of RLL  STUDIES:  CT chest (01/21/15) IMPRESSION: 1. There is a spectrum of findings in the thorax that is most likely related to the patient's history of sarcoidosis. Specifically, there is a perilymphatic distribution of multiple tiny subcentimeter pulmonary nodules, in addition to mediastinal lymphadenopathy. 2. Dilatation of the pulmonic trunk (3.6 cm in diameter), suggestive of pulmonary arterial hypertension. 3. Splenomegaly. 4. Probable hepatomegaly.   SUBJECTIVE:  Remains weak, fell getting up from toilet, no apparent injury.  Fevers down with steroids VITAL SIGNS: Temp:  [95.2 F (35.1 C)-101.1 F (38.4 C)] 95.2 F (35.1 C) (09/10 0436) Pulse Rate:  [67-91] 68 (09/10 0532) Resp:  [16-19] 18 (09/10 0532) BP: (103-135)/(61-79) 107/69 mmHg (09/10 0532) SpO2:  [94 %-99 %] 98 % (09/10 0532)  PHYSICAL EXAMINATION: General:  Frail, ill appearing woman, no apparent distress Neuro:  No focal deficits HEENT:  PERRLA, Moist mucus membranes. Cardiovascular:  RRR Lungs:  Basal crackles. No wheeze. Abdomen:  Distended, Soft, + BS Skin:  Intact   Recent Labs Lab 01/24/15 0409 01/25/15 0332 01/26/15 0317  NA 132* 129* 132*  K 3.3* 4.0 3.7  CL 98* 96* 98*  CO2 28 22 25   BUN <5* <5* 7  CREATININE 0.36* 0.60 0.53  GLUCOSE 49* 148*  298*    Recent Labs Lab 01/24/15 0409 01/25/15 0332 01/26/15 0317  HGB 10.8* 10.2* 10.3*  HCT 33.1* 31.1* 30.9*  WBC 8.0 8.8 5.0  PLT 357 330 311   No results found.  ALL micro data neg from [facility].  No bx done All prior scans/labs/studies reviewed in [facility]  ASSESSMENT / PLAN: 44 year-old with stage IV sarcoidosis. Not on any immunosuppression. Now comes with fever, cough and CT scan findings of multiple pulmonary nodules. She has pulmonary nodules on her chest imaging as far back as 2003. But the most recent CAT scan shows more prominent nodules in the right lower lobe.   I know this pt very well.  Only been getting inhaled steroids.  She has a systemic illness.  Putting all findings together I am now suspecting this is all related to an aggressive stage IV necrotizing febrile form of Sarcoidosis.  Of course infection needs to be ruled out, however the constellation of findings suggest sarcoid.  It would be nice if a biopsy of the lung could be performed.  Transbronchial.  Even after on steroids, findings will persist.  The constellation of findings:   #1 Classic CT scan of chest findings for diffuse sarcoidosis with peribronchial and vascular nodules, these are new.  Mediastinal hilar LAN   #2 Probable liver and spleen involvement.  Elevated alk phosph    #3 poss pulm HTN.  Needs to be excluded, may be contributing to hepatosplenomegaly     Plan  I would proceed with IV medrol and d/c po prednisone  Consider FOB repeat with TBBX  next week  D/c vancomycin.  No pos MRSA cultures yet.  D/c other abx soon  Since no pos micro data from BAL/BC etc.   NOTE SHE IS HIGH RISK TO GET C DIFF    Glyn Ade  [phone]  Cell  [phone]  If no response or cell goes to voicemail, call beeper [phone]  [facility]  01/26/2015, 8:49 AM

## 2015-01-26 NOTE — Progress Notes (Signed)
INFECTIOUS DISEASE PROGRESS NOTE  ID: [name] is a 44 y.o. female with  Principal Problem:   Sarcoidosis, stage 4 Active Problems:   Diabetes mellitus, type 2   Essential hypertension   SYNDROME, LONG QT   PSVT   OBSTRUCTIVE CHRONIC BRONCHITIS   Palpitations   Automatic implantable cardioverter-defibrillator in situ   Elevated alkaline phosphatase level   Hypokalemia   Blood poisoning  Subjective: Continued dizziness, fall this AM.   Abtx:  Anti-infectives    Start     Dose/Rate Route Frequency Ordered Stop   01/25/15 1100  doxycycline (VIBRAMYCIN) 100 mg in dextrose 5 % 250 mL IVPB     100 mg 125 mL/hr over 120 Minutes Intravenous Every 12 hours 01/25/15 0925     01/25/15 0100  vancomycin (VANCOCIN) IVPB 1000 mg/200 mL premix  Status:  Discontinued     1,000 mg 200 mL/hr over 60 Minutes Intravenous Every 8 hours 01/24/15 2057 01/26/15 0901   01/23/15 2000  vancomycin (VANCOCIN) 500 mg in sodium chloride 0.9 % 100 mL IVPB  Status:  Discontinued     500 mg 100 mL/hr over 60 Minutes Intravenous Every 8 hours 01/23/15 1048 01/24/15 2057   01/23/15 1200  cefTRIAXone (ROCEPHIN) 1 g in dextrose 5 % 50 mL IVPB     1 g 100 mL/hr over 30 Minutes Intravenous Every 24 hours 01/23/15 1048     01/23/15 1200  vancomycin (VANCOCIN) IVPB 1000 mg/200 mL premix     1,000 mg 200 mL/hr over 60 Minutes Intravenous  Once 01/23/15 1048 01/23/15 1437   01/20/15 0600  piperacillin-tazobactam (ZOSYN) IVPB 3.375 g  Status:  Discontinued     3.375 g 12.5 mL/hr over 240 Minutes Intravenous 3 times per day 01/19/15 2335 01/20/15 0959   01/20/15 0600  vancomycin (VANCOCIN) 500 mg in sodium chloride 0.9 % 100 mL IVPB  Status:  Discontinued     500 mg 100 mL/hr over 60 Minutes Intravenous Every 8 hours 01/19/15 2335 01/20/15 0959   01/19/15 2045  piperacillin-tazobactam (ZOSYN) IVPB 3.375 g     3.375 g 100 mL/hr over 30 Minutes Intravenous  Once 01/19/15 2040 01/19/15 2224   01/19/15 2045   vancomycin (VANCOCIN) IVPB 1000 mg/200 mL premix     1,000 mg 200 mL/hr over 60 Minutes Intravenous  Once 01/19/15 2040 01/19/15 2224      Medications:  Scheduled: . budesonide (PULMICORT) nebulizer solution  0.25 mg Nebulization BID  . butamben-tetracaine-benzocaine  1 spray Topical Once  . cefTRIAXone (ROCEPHIN)  IV  1 g Intravenous Q24H  . cycloSPORINE  1 drop Both Eyes BID  . doxycycline (VIBRAMYCIN) IV  100 mg Intravenous Q12H  . feeding supplement (ENSURE ENLIVE)  237 mL Oral TID BM  . hydrocortisone  25 mg Rectal BID  . insulin aspart  0-9 Units Subcutaneous TID WC  . insulin glargine  15 Units Subcutaneous Q breakfast  . lidocaine  1 application Topical Once  . Linaclotide  145 mcg Oral Daily  . methylPREDNISolone (SOLU-MEDROL) injection  60 mg Intravenous Q6H  . olopatadine  1 drop Both Eyes BID  . ondansetron  4 mg Oral Q12H  . pantoprazole  40 mg Oral Q0600  . potassium chloride SA  40 mEq Oral Daily  . propranolol  80 mg Oral BID  . sodium chloride  3 mL Intravenous Q12H    Objective: Vital signs in last 24 hours: Temp:  [95.2 F (35.1 C)-101.1 F (38.4 C)] 97.6 F (  36.4 C) (09/10 0956) Pulse Rate:  [67-91] 70 (09/10 0956) Resp:  [16-19] 18 (09/10 0956) BP: (103-135)/(61-79) 112/77 mmHg (09/10 0956) SpO2:  [94 %-99 %] 98 % (09/10 0956)   General appearance: alert, cooperative and no distress Resp: clear to auscultation bilaterally Cardio: regular rate and rhythm GI: normal findings: bowel sounds normal and soft, non-tender  Lab Results  Recent Labs  01/25/15 0332 01/26/15 0317  WBC 8.8 5.0  HGB 10.2* 10.3*  HCT 31.1* 30.9*  NA 129* 132*  K 4.0 3.7  CL 96* 98*  CO2 22 25  BUN <5* 7  CREATININE 0.60 0.53   Liver Panel No results for input(s): PROT, ALBUMIN, AST, ALT, ALKPHOS, BILITOT, BILIDIR, IBILI in the last 72 hours. Sedimentation Rate No results for input(s): ESRSEDRATE in the last 72 hours. C-Reactive Protein No results for input(s):  CRP in the last 72 hours.  Microbiology: Recent Results (from the past 240 hour(s))  Blood Culture (routine x 2)     Status: None   Collection Time: 01/19/15  8:59 PM  Result Value Ref Range Status   Specimen Description BLOOD RIGHT FOREARM  Final   Special Requests BOTTLES DRAWN AEROBIC AND ANAEROBIC 5CC  Final   Culture NO GROWTH 5 DAYS  Final   Report Status 01/24/2015 FINAL  Final  Blood Culture (routine x 2)     Status: None   Collection Time: 01/19/15  9:05 PM  Result Value Ref Range Status   Specimen Description BLOOD RIGHT HAND  Final   Special Requests BOTTLES DRAWN AEROBIC AND ANAEROBIC 5CC  Final   Culture NO GROWTH 5 DAYS  Final   Report Status 01/24/2015 FINAL  Final  Urine culture     Status: None   Collection Time: 01/19/15 10:02 PM  Result Value Ref Range Status   Specimen Description URINE, CLEAN CATCH  Final   Special Requests NONE  Final   Culture NO GROWTH 2 DAYS  Final   Report Status 01/21/2015 FINAL  Final  Culture, blood (routine x 2)     Status: None (Preliminary result)   Collection Time: 01/22/15 12:50 PM  Result Value Ref Range Status   Specimen Description BLOOD RIGHT ARM  Final   Special Requests BOTTLES DRAWN AEROBIC ONLY  5CC  Final   Culture NO GROWTH 4 DAYS  Final   Report Status PENDING  Incomplete  Culture, blood (routine x 2)     Status: None (Preliminary result)   Collection Time: 01/22/15 12:55 PM  Result Value Ref Range Status   Specimen Description BLOOD LEFT ARM  Final   Special Requests IN PEDIATRIC BOTTLE  1CC  Final   Culture NO GROWTH 4 DAYS  Final   Report Status PENDING  Incomplete  AFB culture with smear     Status: None (Preliminary result)   Collection Time: 01/24/15  8:15 AM  Result Value Ref Range Status   Specimen Description BRONCHIAL ALVEOLAR LAVAGE  Final   Special Requests RLL  Final   Acid Fast Smear   Final    NO ACID FAST BACILLI SEEN Performed at [facility]    Culture   Final    CULTURE WILL BE  EXAMINED FOR 6 WEEKS BEFORE ISSUING A FINAL REPORT Performed at [facility]    Report Status PENDING  Incomplete  Culture, bal-quantitative     Status: None (Preliminary result)   Collection Time: 01/24/15  8:15 AM  Result Value Ref Range Status   Specimen  Description BRONCHIAL ALVEOLAR LAVAGE  Final   Special Requests RLL  Final   Gram Stain   Final    FEW WBC PRESENT, PREDOMINANTLY MONONUCLEAR RARE SQUAMOUS EPITHELIAL CELLS PRESENT NO ORGANISMS SEEN Performed at [facility]    Culture   Final    Culture reincubated for better growth Performed at [facility]    Report Status PENDING  Incomplete  Fungus Culture with Smear     Status: None (Preliminary result)   Collection Time: 01/24/15  8:15 AM  Result Value Ref Range Status   Specimen Description BRONCHIAL ALVEOLAR LAVAGE  Final   Special Requests RLL  Final   Fungal Smear   Final    NO YEAST OR FUNGAL ELEMENTS SEEN Performed at [facility]    Culture   Final    CULTURE IN PROGRESS FOR FOUR WEEKS Performed at [facility]    Report Status PENDING  Incomplete    Studies/Results: No results found.   Assessment/Plan: Fever Sarcoid DM2 AICD for long QT syndrome  Total days of antibiotics: 4 vanco/ceftriaxone/doxy   Appreciate Dr Bettina Gavia note.  Will stop her anbx Her stains are negative.  Continue steroids         Bobby Rumpf Infectious Diseases (pager) [phone] [email or web address] 01/26/2015, 11:25 AM  LOS: 6 days

## 2015-01-27 ENCOUNTER — Inpatient Hospital Stay (HOSPITAL_COMMUNITY): Payer: Medicaid Other

## 2015-01-27 DIAGNOSIS — D862 Sarcoidosis of lung with sarcoidosis of lymph nodes: Secondary | ICD-10-CM | POA: Diagnosis present

## 2015-01-27 LAB — CULTURE, BLOOD (ROUTINE X 2)
CULTURE: NO GROWTH
CULTURE: NO GROWTH

## 2015-01-27 LAB — PHOSPHORUS: Phosphorus: 2.6 mg/dL (ref 2.5–4.6)

## 2015-01-27 LAB — BASIC METABOLIC PANEL
Anion gap: 9 (ref 5–15)
BUN: 8 mg/dL (ref 6–20)
CALCIUM: 9.1 mg/dL (ref 8.9–10.3)
CO2: 25 mmol/L (ref 22–32)
Chloride: 103 mmol/L (ref 101–111)
Creatinine, Ser: 0.53 mg/dL (ref 0.44–1.00)
GFR calc Af Amer: >60 mL/min (ref >60)
GLUCOSE: 386 mg/dL — AB (ref 65–99)
POTASSIUM: 4.4 mmol/L (ref 3.5–5.1)
Sodium: 137 mmol/L (ref 135–145)

## 2015-01-27 LAB — CULTURE, BAL-QUANTITATIVE W GRAM STAIN

## 2015-01-27 LAB — GLUCOSE, CAPILLARY
GLUCOSE-CAPILLARY: 270 mg/dL — AB (ref 65–99)
Glucose-Capillary: 369 mg/dL — ABNORMAL HIGH (ref 65–99)
Glucose-Capillary: 258 mg/dL — ABNORMAL HIGH (ref 65–99)
Glucose-Capillary: 216 mg/dL — ABNORMAL HIGH (ref 65–99)

## 2015-01-27 LAB — CBC
HEMATOCRIT: 33.1 % — AB (ref 36.0–46.0)
HEMOGLOBIN: 10.5 g/dL — AB (ref 12.0–15.0)
MCH: 28.2 pg (ref 26.0–34.0)
MCHC: 31.7 g/dL (ref 30.0–36.0)
MCV: 88.7 fL (ref 78.0–100.0)
Platelets: 377 10*3/uL (ref 150–400)
RBC: 3.73 MIL/uL — AB (ref 3.87–5.11)
RDW: 14.4 % (ref 11.5–15.5)
WBC: 7.3 10*3/uL (ref 4.0–10.5)

## 2015-01-27 LAB — IGG: IgG (Immunoglobin G), Serum: 2648 mg/dL — ABNORMAL HIGH (ref 700–1600)

## 2015-01-27 LAB — CULTURE, BAL-QUANTITATIVE

## 2015-01-27 LAB — MITOCHONDRIAL ANTIBODIES: Mitochondrial M2 Ab, IgG: 12.5 Units (ref 0.0–20.0)

## 2015-01-27 LAB — ANTI-SMOOTH MUSCLE ANTIBODY, IGG: F-Actin IgG: 16 Units (ref 0–19)

## 2015-01-27 LAB — MAGNESIUM: Magnesium: 2 mg/dL (ref 1.7–2.4)

## 2015-01-27 MED ORDER — INSULIN GLARGINE 100 UNIT/ML ~~LOC~~ SOLN
20.0000 [IU] | Freq: Every day | SUBCUTANEOUS | Status: DC
  Administered 2015-01-28: 20 [IU] via SUBCUTANEOUS
  Filled 2015-01-27 (×2): qty 0.2

## 2015-01-27 MED ORDER — DOCUSATE SODIUM 100 MG PO CAPS
100.0000 mg | ORAL_CAPSULE | Freq: Every day | ORAL | Status: DC
  Administered 2015-01-27: 100 mg via ORAL
  Filled 2015-01-27 (×3): qty 1

## 2015-01-27 MED ORDER — INSULIN ASPART 100 UNIT/ML ~~LOC~~ SOLN
0.0000 [IU] | Freq: Three times a day (TID) | SUBCUTANEOUS | Status: DC
Start: 2015-01-27 — End: 2015-01-29
  Administered 2015-01-27 – 2015-01-28 (×4): 11 [IU] via SUBCUTANEOUS
  Administered 2015-01-28: 7 [IU] via SUBCUTANEOUS
  Administered 2015-01-29: 3 [IU] via SUBCUTANEOUS

## 2015-01-27 NOTE — Progress Notes (Addendum)
Name: [name] MRN: [id number] DOB: 1971-05-18    ADMISSION DATE:  01/19/2015 CONSULTATION DATE:  01/22/15  REFERRING MD :  Gwendlyn Deutscher  CHIEF COMPLAINT:  Cough, fevers.  BRIEF PATIENT DESCRIPTION: 44 y.o. , African-American female with history of Stage III sarcoidosis and associated asthmatic bronchitis. Patient of Dr. Lyda Jester. Sarcoidosis not thought to be active and she was in systemic steroids for about a year in past but not since atleast 2008. Maintained on inhaled corticosteroids. She is also history of chest pain which has been evaluated thoroughly by cardiology and long QT syndrome.   SIGNIFICANT EVENTS  9/8 Bronchoscopy BAL of RLL  STUDIES:  CT chest (01/21/15) IMPRESSION: 1. There is a spectrum of findings in the thorax that is most likely related to the patient's history of sarcoidosis. Specifically, there is a perilymphatic distribution of multiple tiny subcentimeter pulmonary nodules, in addition to mediastinal lymphadenopathy. 2. Dilatation of the pulmonic trunk (3.6 cm in diameter), suggestive of pulmonary arterial hypertension. 3. Splenomegaly. 4. Probable hepatomegaly.   SUBJECTIVE:  Remains weak, fell getting up from toilet, no apparent injury.  Fevers down with steroids VITAL SIGNS: Temp:  [97.4 F (36.3 C)-97.9 F (36.6 C)] 97.5 F (36.4 C) (09/11 0340) Pulse Rate:  [58-71] 70 (09/11 0340) Resp:  [18] 18 (09/11 0340) BP: (112-131)/(77-90) 131/89 mmHg (09/11 0340) SpO2:  [95 %-98 %] 96 % (09/11 0803)  PHYSICAL EXAMINATION: General:  Frail, ill appearing woman, no apparent distress Neuro:  No focal deficits HEENT:  PERRLA, Moist mucus membranes. Cardiovascular:  RRR Lungs:  Basal crackles. No wheeze. Abdomen:  Distended, Soft, + BS Skin:  Intact   Recent Labs Lab 01/25/15 0332 01/26/15 0317 01/27/15 0409  NA 129* 132* 137  K 4.0 3.7 4.4  CL 96* 98* 103  CO2 22 25 25   BUN <5* 7 8  CREATININE 0.60 0.53 0.53  GLUCOSE 148* 298* 386*     Recent Labs Lab 01/25/15 0332 01/26/15 0317 01/27/15 0409  HGB 10.2* 10.3* 10.5*  HCT 31.1* 30.9* 33.1*  WBC 8.8 5.0 7.3  PLT 330 311 377   Dg Hips Bilat With Pelvis 3-4 Views  01/26/2015   CLINICAL DATA:  Patient was using bedside commode this morning and fell.  EXAM: DG HIP (WITH OR WITHOUT PELVIS) 3-4V BILAT  COMPARISON:  None  FINDINGS: There is no evidence of hip fracture or dislocation. There is no evidence of arthropathy or other focal bone abnormality.  IMPRESSION: 1. No displaced hip fractures identified. 2. If there is high clinical suspicion for occult fracture or the patient refuses to weightbear, consider further evaluation with MRI. Although CT is expeditious, evidence is lacking regarding accuracy of CT over plain film radiography.   Electronically Signed   By: Kerby Moors M.D.   On: 01/26/2015 12:04    ALL micro data neg from Fob.  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 Has obstruction in airway and reticular nodular RLL changes and mediastinal hilar LAN.  Now with extra pulmonary manifestations in liver/spleen.  At this point no need for FOB    #1 Classic CT scan of chest findings for diffuse sarcoidosis with peribronchial and vascular nodules, these are new.  Mediastinal hilar LAN.  Re review of CT scan suggests Stage III Sarcoidosis.  No volume loss or traction bronchiectasis.  Extra- pulmonary systemic manifestations as well   #2  liver and spleen involvement sarcoidosis.  Elevated alk phosph    #3 poss  pulm HTN.  Needs to be excluded, may be contributing to hepatosplenomegaly   #4 The staph aureus col count #10K likely not pathogenic likely contaminant     Plan  Cont IV medrol 24hrs further then change to Prednisone 28m daily with SLOW taper   At this point NO need for FOB unless patient declines or does not improve further  All ABX have been d/c'd  >>I spoke to Dr HJohnnye SimaRe: this 9/10    PMariel SleetBeeper  [phone] Cell  [phone]  If no response or cell goes to voicemail, call beeper [phone] [place] [facility]  01/27/2015, 9:10 AM

## 2015-01-27 NOTE — Progress Notes (Signed)
Family Medicine Teaching Service Daily Progress Note Intern Pager: [phone]  Patient name: [name] Medical record number: [id number] Date of birth: 11-Dec-1970 Age: 44 y.o. Gender: female  Primary Care Provider: Cordelia Poche, MD Consultants: cards, ID, pulmonology   Code Status: full  Pt Overview and Major Events to Date:  9/3 - admitted with prolonged palpitations and low grade fever 9/5 - CT chest spectrum of findings related to sarcoidosis and dilation of pulmonic trunk likely related to [facility]  9/6 - ICD interogated; Pulm and ID consulted  9/7 - Vancomycin and Ceftriaxone started  9/8 - Bronchoscopy performed  9/9 - Started on PO pred and doxycycline. Fell while transferring from bedside commode to bed. 9/10 - Vanc/ceftriaxone/doxy d/c'd and PO pred switched to IV Solu-Medrol  Assessment and Plan: [name] is a 44 y.o. female presenting with sensation of racing heart and fever at home to 102. PMH is significant for Long QT, history of torsades, AICD in place, DM2, Sarcoidosis  Fevers secondary sarcoidosis: Patient was febrile to 100.7 on admission with lactate to 5.37.  CT chest with findings most likely related to sarcoidosis - perilymphatic distribution of multiple tiny subcentimeter pulmonary nodules and mediastinal lymphadenopathy. Antibiotics discontinued. Became febrile afternoon of 9/5. Underwent bronchoscopy 9/8 with right lower lobe BAL, no hemorrhage noted, transbronchial biopsies not done 2/2 to desat to mid 80s. Lactic acidosis resolved 9/8. Prednisone started 9/9 and switched to IV Solu-Medrol 9/10. No fevers over last 24 hours with no leukocytosis.  - Repeat Bcx from 9/6 NGTD - ID following, appreciate recommendations.  - Pulm following, appreciate recommendations. Continue IV Solu-Medrol 65m Q6hr for additional 24 hours then change to prednisone 671mdaily with slow taper.  -BAL cytology with inflammatory cells and no malignancy. BAL AFB cx, fungus cx  negative to date. Legionella and pneumocystis pending. - Echo pending to evaluate for pulm HTN  Fall, dizziness: Fall on AM of 9/10. No LOC or head trauma. Negative orthostatics. No displaced hip fractures on XR. - Continue tele  Prolonged QTc with history of torsades de pointes, SVT s/p AICD in 2006: No hypokalemia - EP to see as needed - Telemetry  - Continue 402mK-DUR daily   Lupus: Diagnosed per patient in 2005 with no follow up or treatment. ANA negative. - consider rheumatology followup outpatient  - IgG, mitochondrial antibodies, Anti smooth muscle antibodies pending  Rectal Bleeding secondary hemorrhoids: Hemoglobin stable and hemodynamically stable. Small bright red blood with small clots noted with BM yesterday evening. - GI following, appreciate recommendations - Docusate 100m21mily - Anusol suppositories for 3 days  Asthmatic bronchitis associated with sarcoidosis:  - Budesonide neb 0.25mg55m  DM2: Last A1c 6.19 Dec 2014. Now with steroid induced hyperglycemia. - Lantus increase to 20u QAM - resistant SSI - CBG AC/HS  Weight loss/Anorexia: Approximately 55 lb weight loss from 12/2013 to 01/20/2015 associated with decreased appetite and early satiety. Extensive work up by GI with EGD, CT, US abKoreamen and gastric emptying study noted to be unremarkable in terms of causes of weight loss. Potentially secondary to sardoidosis, and decreased PO intake.  - Ensure Enlive between meals and magic cup with meals   Elevated Alk phos: Alk phos 674, elevated since 09/2014. GHTT on 01/15/2015 elevated to 372. Normal bilirubin and liver transaminases. US neKoreative for liver or gall bladder pathology. Negative Hep B and Hep C serologies.  - GI following, appreciate recommendations - Likely secondary to sarcoidosis  Hepatosplenomegaly: persistent since being noted on 10/25/2014,  now with concern for gastric varices and nodular liver edge concerning for cirrhosis. ? Possible amyloidosis  given elevated ALP, previously elevated T protein even with low albumin. No monoclonal protein abnormality. SPEP nonspecific. Iron studies with elevated ferritin - increased as an acute phase reactant.  - IgG, mitochondrial antibodies, Anti smooth muscle antibodies pending  FEN/GI: Carb modified diet Prophylaxis: Lovenox  Disposition: home pending clinical improvement and further workup  Subjective:  No acute events overnight. Feels better this morning. Feels more steady on feet. Denies CP or shortness of breath.   Objective: Temp:  [97.4 F (36.3 C)-97.9 F (36.6 C)] 97.5 F (36.4 C) (09/11 0340) Pulse Rate:  [58-71] 70 (09/11 0340) Resp:  [18] 18 (09/11 0340) BP: (112-131)/(77-90) 131/89 mmHg (09/11 0340) SpO2:  [95 %-98 %] 96 % (09/11 0340) Physical Exam: General: Thin-appearing female, sitting up on the side of the bed, in NAD HEENT: [place]/AT, EOMI, MMM Cardiovascular: RRR, no murmurs Respiratory: Bibasilar rales but otherwise clear, normal work of breathing Abdomen: soft, mildly distended Skin: No rashes or lesion Neuro: No focal deficits Psych: normal mood and affect  Laboratory:  Recent Labs Lab 01/25/15 0332 01/26/15 0317 01/27/15 0409  WBC 8.8 5.0 7.3  HGB 10.2* 10.3* 10.5*  HCT 31.1* 30.9* 33.1*  PLT 330 311 377    Recent Labs Lab 01/20/15 0935  01/25/15 0332 01/26/15 0317 01/27/15 0409  NA 130*  < > 129* 132* 137  K 3.8  < > 4.0 3.7 4.4  CL 96*  < > 96* 98* 103  CO2 27  < > _0 BUN <5*  < > <5* 7 8  CREATININE 0.55  < > 0.60 0.53 0.53  CALCIUM 8.1*  < > 8.2* 8.7* 9.1  PROT 7.7  --   --   --   --   BILITOT 0.5  --   --   --   --   ALKPHOS 609*  --   --   --   --   ALT 12*  --   --   --   --   AST 35  --   --   --   --   GLUCOSE 131*  < > 148* 298* 386*  < > = values in this interval not displayed.  Imaging/Diagnostic Tests: 9/3 CT abd/pelvis  1. Diffuse tiny nodular opacities within the right lower lung lobe and right middle lung lobe  raise suspicion for atypical pneumonia. This is new from the prior study. 2. Splenomegaly and hepatomegaly again noted. 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 Apparent gastric varices suggested, not well seen without contrast.  9/3 CXR No active cardiopulmonary disease.  Milagros Loll, MD 01/27/2015, 8:03 AM PGY-2, [place] Intern pager: [phone], text pages welcome

## 2015-01-28 ENCOUNTER — Inpatient Hospital Stay (HOSPITAL_COMMUNITY): Payer: Medicaid Other

## 2015-01-28 DIAGNOSIS — I340 Nonrheumatic mitral (valve) insufficiency: Secondary | ICD-10-CM

## 2015-01-28 DIAGNOSIS — W19XXXD Unspecified fall, subsequent encounter: Secondary | ICD-10-CM

## 2015-01-28 LAB — GLUCOSE, CAPILLARY
GLUCOSE-CAPILLARY: 285 mg/dL — AB (ref 65–99)
GLUCOSE-CAPILLARY: 242 mg/dL — AB (ref 65–99)
GLUCOSE-CAPILLARY: 291 mg/dL — AB (ref 65–99)
GLUCOSE-CAPILLARY: 319 mg/dL — AB (ref 65–99)

## 2015-01-28 LAB — BASIC METABOLIC PANEL
Anion gap: 6 (ref 5–15)
BUN: 13 mg/dL (ref 6–20)
CO2: 26 mmol/L (ref 22–32)
Calcium: 8.6 mg/dL — ABNORMAL LOW (ref 8.9–10.3)
Chloride: 102 mmol/L (ref 101–111)
Creatinine, Ser: 0.56 mg/dL (ref 0.44–1.00)
Glucose, Bld: 427 mg/dL — ABNORMAL HIGH (ref 65–99)
POTASSIUM: 4.5 mmol/L (ref 3.5–5.1)
SODIUM: 134 mmol/L — AB (ref 135–145)

## 2015-01-28 LAB — CBC
HCT: 33.6 % — ABNORMAL LOW (ref 36.0–46.0)
Hemoglobin: 10.4 g/dL — ABNORMAL LOW (ref 12.0–15.0)
MCH: 27.7 pg (ref 26.0–34.0)
MCHC: 31 g/dL (ref 30.0–36.0)
MCV: 89.4 fL (ref 78.0–100.0)
PLATELETS: 378 10*3/uL (ref 150–400)
RBC: 3.76 MIL/uL — AB (ref 3.87–5.11)
RDW: 14.7 % (ref 11.5–15.5)
WBC: 11.7 10*3/uL — AB (ref 4.0–10.5)

## 2015-01-28 LAB — PNEUMOCYSTIS JIROVECI SMEAR BY DFA: PNEUMOCYSTIS JIROVECI AG: NEGATIVE

## 2015-01-28 MED ORDER — BUTAMBEN-TETRACAINE-BENZOCAINE 2-2-14 % EX AERO
1.0000 | INHALATION_SPRAY | Freq: Once | CUTANEOUS | Status: AC
  Filled 2015-01-28: qty 20

## 2015-01-28 MED ORDER — LIDOCAINE HCL 2 % EX GEL
1.0000 "application " | Freq: Once | CUTANEOUS | Status: AC
  Filled 2015-01-28: qty 5

## 2015-01-28 MED ORDER — INSULIN GLARGINE 100 UNIT/ML ~~LOC~~ SOLN
30.0000 [IU] | Freq: Every day | SUBCUTANEOUS | Status: DC
  Administered 2015-01-29: 30 [IU] via SUBCUTANEOUS
  Filled 2015-01-28 (×3): qty 0.3

## 2015-01-28 MED ORDER — INSULIN GLARGINE 100 UNIT/ML ~~LOC~~ SOLN
15.0000 [IU] | Freq: Once | SUBCUTANEOUS | Status: DC
  Filled 2015-01-28: qty 0.15

## 2015-01-28 MED ORDER — PREDNISONE 50 MG PO TABS
60.0000 mg | ORAL_TABLET | Freq: Every day | ORAL | Status: DC
  Filled 2015-01-28 (×2): qty 1

## 2015-01-28 MED ORDER — PHENYLEPHRINE HCL 0.25 % NA SOLN
1.0000 | Freq: Four times a day (QID) | NASAL | Status: DC | PRN
  Filled 2015-01-28: qty 15

## 2015-01-28 MED ORDER — SODIUM CHLORIDE 0.9 % IV SOLN: Freq: Once | INTRAVENOUS | Status: AC

## 2015-01-28 MED ORDER — PREDNISONE 20 MG PO TABS
30.0000 mg | ORAL_TABLET | Freq: Every day | ORAL | Status: DC
  Administered 2015-01-28 – 2015-01-29 (×2): 30 mg via ORAL
  Filled 2015-01-28 (×4): qty 1

## 2015-01-28 NOTE — Progress Notes (Signed)
*  PRELIMINARY RESULTS* Echocardiogram 2D Echocardiogram has been performed.  Leavy Cella 01/28/2015, 2:44 PM

## 2015-01-28 NOTE — Progress Notes (Signed)
UR Completed. Deangelo Berns, RN, BSN.  [phone] 

## 2015-01-28 NOTE — Progress Notes (Signed)
Name: [name] MRN: [id number] DOB: 10-10-1970    ADMISSION DATE:  01/19/2015 CONSULTATION DATE:  01/22/15  REFERRING MD :  Gwendlyn Deutscher  CHIEF COMPLAINT:  Cough, fevers.  BRIEF PATIENT DESCRIPTION: 44 y.o. F with history of Stage III sarcoidosis and associated asthmatic bronchitis on inhaled corticosteroids, followed by Dr. Lyda Jester. Sarcoidosis not thought to be active and she was in systemic steroids for about a year in past but not since at least 2008. She is also history of chest pain which has been evaluated thoroughly by cardiology and long QT syndrome. Admitted on 9/3 for complaints of heart palpitations, weakness and fevers.   SIGNIFICANT EVENTS  9/08  Bronchoscopy BAL of RLL 9/10  Fell getting up from bedside toilet   STUDIES:  CT chest 9/5 >> there is a spectrum of findings in the thorax that is most likely related to the patient's history of sarcoidosis. Specifically, thereis a perilymphatic distribution of multiple tiny sub-centimeter pulmonary nodules, in addition to mediastinal lymphadenopathy. Dilatation of the pulmonic trunk (3.6 cm in diameter), suggestive of pulmonary arterial hypertension. Splenomegaly.  Probable hepatomegaly.   SUBJECTIVE:  Pt denies acute complaints.  Denies SOB, chest pain, n/v/d  VITAL SIGNS: Temp:  [97.4 F (36.3 C)-97.5 F (36.4 C)] 97.5 F (36.4 C) (09/12 0836) Pulse Rate:  [56-75] 62 (09/12 0836) Resp:  [16-19] 16 (09/12 0836) BP: (125-145)/(86-97) 133/90 mmHg (09/12 0836) SpO2:  [94 %-98 %] 98 % (09/12 0836)  PHYSICAL EXAMINATION: General:  Frail, ill appearing woman, no acute distress Neuro:  No focal deficits HEENT:  PERRLA, Moist mucus membranes. Cardiovascular:  RRR Lungs:  Basilar crackles. No wheeze. Abdomen:  Distended, Soft, + BS Skin:  Intact   Recent Labs Lab 01/26/15 0317 01/27/15 0409 01/28/15 0220  NA 132* 137 134*  K 3.7 4.4 4.5  CL 98* 103 102  CO2 _0 BUN _1 CREATININE 0.53 0.53 0.56    GLUCOSE 298* 386* 427*    Recent Labs Lab 01/26/15 0317 01/27/15 0409 01/28/15 0220  HGB 10.3* 10.5* 10.4*  HCT 30.9* 33.1* 33.6*  WBC 5.0 7.3 11.7*  PLT 311 377 378   No results found.  ALL micro data neg from Fob.  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  Now with extra pulmonary manifestations in liver/spleen.   FOB completed 9/8 cytology with inflammatory cells and no malignancy. BAL AFB cx, fungus cx negative to date.   1.  Classic CT scan of chest findings for diffuse sarcoidosis with peribronchial and vascular nodules, these are new.  Mediastinal hilar LAN.  Re review of CT scan suggests Stage III Sarcoidosis.  No volume loss or traction bronchiectasis.  Extra-pulmonary systemic manifestations as well  2.  Liver and spleen involvement sarcoidosis.  Elevated alk phosph   3.  Possible pulm HTN.  Needs to be excluded, may be contributing to hepatosplenomegaly  4.  The staph aureus col count #10K likely not pathogenic likely  contaminant     Plan Prednisone to 23m QD (0.523mkg) and stay until seen by Pulmonary  Monitor off ABX  Pulmonary hygiene  Follow AFB/Yeast >> negative smear >> Follow up arranged in pulmonary office, see discharge summary   PCCM will be available PRN.  Please call back if new needs arise.    BrNoe GensNP-C [place] [facility] Pgr: [phone] or if no answer [phone]/04/2015, 9:58 AM   Attending Note:   I have examined patient, reviewed labs, studies and notes. I have discussed the case with B Ollis, and I agree with the data and plans as amended above. Plan transition to Pred 3018md, continue this dose until seen by Dr ManLearta Coddingltimately duration will likely be dictated by follow up imaging. Await TTE today to eval estimated PAP's    RobBaltazar ApoD, PhD 01/28/2015, 10:27 AM [place] [facility] [phone] if no answer [phone]

## 2015-01-28 NOTE — Progress Notes (Signed)
Family Medicine Teaching Service Daily Progress Note Intern Pager: [phone]  Patient name: [name] Medical record number: [id number] Date of birth: 20-Dec-1970 Age: 44 y.o. Gender: female  Primary Care Provider: Cordelia Poche, MD Consultants: cards, ID, pulmonology   Code Status: full  Pt Overview and Major Events to Date:  9/3 - admitted with prolonged palpitations and low grade fever 9/5 - CT chest spectrum of findings related to sarcoidosis and dilation of pulmonic trunk likely related to [facility]  9/6 - ICD interogated; Pulm and ID consulted  9/7 - Vancomycin and Ceftriaxone started  9/8 - Bronchoscopy performed  9/9 - Started on PO pred and doxycycline. Fell while transferring from bedside commode to bed. 9/10 - Vanc/ceftriaxone/doxy d/c'd and PO pred switched to IV Solu-Medrol  Assessment and Plan: [name] is a 44 y.o. female presenting with sensation of racing heart and fever at home to 102. PMH is significant for Long QT, history of torsades, AICD in place, DM2, Sarcoidosis  Fevers secondary sarcoidosis: Patient was febrile to 100.7 on admission with lactate to 5.37.  CT chest with findings most likely related to sarcoidosis - perilymphatic distribution of multiple tiny subcentimeter pulmonary nodules and mediastinal lymphadenopathy. Antibiotics discontinued. Became febrile afternoon of 9/5. Underwent bronchoscopy 9/8 with right lower lobe BAL, no hemorrhage noted, transbronchial biopsies not done 2/2 to desat to mid 80s. Lactic acidosis resolved 9/8. Prednisone started 9/9 and switched to IV Solu-Medrol 9/10. - Repeat Bcx from 9/6 NGTD - ID following, appreciate recommendations.  - Pulm following, appreciate recommendations. Received last dose of IV Solu-medrol AM 9/12; starting Prednisone 60 mg at noon 9/12 -BAL cytology with inflammatory cells and no malignancy. BAL AFB cx, fungus cx negative to date. Legionella and pneumocystis pending. - Echo pending to evaluate for  pulm HTN  Fall, dizziness: Fall on AM of 9/10. No LOC or head trauma. Negative orthostatics. No displaced hip fractures on XR. - Continue tele  Prolonged QTc with history of torsades de pointes, SVT s/p AICD in 2006: No hypokalemia - EP to see as needed - Telemetry  - Continue 5mq K-DUR daily   Lupus: Diagnosed per patient in 2005 with no follow up or treatment. ANA negative. - consider rheumatology followup outpatient  -IgG elevated at 2648, anti-smooth muscle antibodies normal, mitochondrial antibodies normal   Rectal Bleeding secondary hemorrhoids: Hemoglobin stable and hemodynamically stable.  - GI following, appreciate recommendations - Docusate 1051mdaily - Anusol suppositories for 3 days  Asthmatic bronchitis associated with sarcoidosis:  - Budesonide neb 0.2513mID  DM2: Last A1c 6.19 Dec 2014. Now with steroid induced hyperglycemia. - Lantus increase to 20u QAM - resistant SSI - CBG AC/HS  Weight loss/Anorexia: Approximately 55 lb weight loss from 12/2013 to 01/20/2015 associated with decreased appetite and early satiety. Extensive work up by GI with EGD, CT, US [place] and gastric emptying study noted to be unremarkable in terms of causes of weight loss. Potentially secondary to sardoidosis, and decreased PO intake.  - Ensure Enlive between meals and magic cup with meals   Elevated Alk phos: Alk phos 674, elevated since 09/2014. GHTT on 01/15/2015 elevated to 372. Normal bilirubin and liver transaminases. US Koreagative for liver or gall bladder pathology. Negative Hep B and Hep C serologies.  - GI following, appreciate recommendations - Likely secondary to sarcoidosis  Hepatosplenomegaly: persistent since being noted on 10/25/2014, now with concern for gastric varices and nodular liver edge concerning for cirrhosis. ? Possible amyloidosis given elevated ALP, previously elevated T  protein even with low albumin. No monoclonal protein abnormality. SPEP nonspecific. Iron studies  with elevated ferritin - increased as an acute phase reactant.  - IgG elevated   FEN/GI: Carb modified diet Prophylaxis: Lovenox  Disposition: home pending clinical improvement and further workup  Subjective:  No acute events overnight. Ambulating well this morning with PT. Continues to feel better. Denies CP or shortness of breath.   Objective: Temp:  [97.4 F (36.3 C)-97.5 F (36.4 C)] 97.5 F (36.4 C) (09/12 0625) Pulse Rate:  [56-75] 56 (09/12 0625) Resp:  [18-19] 18 (09/12 0625) BP: (125-145)/(86-97) 145/86 mmHg (09/12 0625) SpO2:  [94 %-98 %] 94 % (09/12 0625) Physical Exam: General: Thin-appearing female, sitting up on the side of the bed, in NAD HEENT: [place]/AT, EOMI, MMM Cardiovascular: RRR, no murmurs Respiratory: Bibasilar rales but otherwise clear, normal work of breathing Abdomen: soft, mildly distended Skin: No rashes or lesion Neuro: No focal deficits Psych: normal mood and affect  Laboratory:  Recent Labs Lab 01/26/15 0317 01/27/15 0409 01/28/15 0220  WBC 5.0 7.3 11.7*  HGB 10.3* 10.5* 10.4*  HCT 30.9* 33.1* 33.6*  PLT 311 377 378    Recent Labs Lab 01/26/15 0317 01/27/15 0409 01/28/15 0220  NA 132* 137 134*  K 3.7 4.4 4.5  CL 98* 103 102  CO2 _0 BUN _1 CREATININE 0.53 0.53 0.56  CALCIUM 8.7* 9.1 8.6*  GLUCOSE 298* 386* 427*    Imaging/Diagnostic Tests: 9/3 CT abd/pelvis  1. Diffuse tiny nodular opacities within the right lower lung lobe and right middle lung lobe raise suspicion for atypical pneumonia. This is new from the prior study. 2. Splenomegaly and hepatomegaly again noted. 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 Apparent gastric varices suggested, not well seen without contrast.  9/3 CXR No active cardiopulmonary disease.  Nicolette Bang, DO 01/28/2015, 8:19 AM PGY-1, [place] Intern pager: [phone], text pages welcome

## 2015-01-28 NOTE — Care Management Note (Signed)
Case Management Note  Patient Details  Name: [name] MRN: [id number] Date of Birth: 1970/11/08  Subjective/Objective:   Pt admitted with HCAP                 Action/Plan:  Pt is from home with parents.  Pt stated she already uses a walker at home, PT has been consulted for evaluation   Expected Discharge Date:                  Expected Discharge Plan:  [place]  In-House Referral:     Discharge planning Services  CM Consult  Post Acute Care Choice:    Choice offered to:     DME Arranged:    DME Agency:     HH Arranged:    [place] Agency:     Status of Service:  In process, will continue to follow  Medicare Important Message Given:    Date Medicare IM Given:    Medicare IM give by:    Date Additional Medicare IM Given:    Additional Medicare Important Message give by:     If discussed at [place] of Stay Meetings, dates discussed:    Additional Comments:  Maryclare Labrador, RN 01/28/2015, 12:02 PM

## 2015-01-28 NOTE — Evaluation (Signed)
Physical Therapy Evaluation Patient Details Name: [name] MRN: [id number] DOB: 1971/04/03 Today's Date: 01/28/2015   History of Present Illness  44 Y/O F with PMX of DM2, HTN, Sarcoidosis,Seizure disorder, Long QT syndrome, AICD presented with hx of fever for two days on and off associated with weakness and mild cough before being admitted to hospital. She stated she coughs at baseline so she can not tell if cough had worsened from baseline. She also endorsed palpitation for 2-3 days lasting longer than 30 min.  Clinical Impression  Pt admitted with above diagnosis. Pt currently with functional limitations due to the deficits listed below (see PT Problem List).  Pt will benefit from skilled PT to increase their independence and safety with mobility to allow discharge to the venue listed below.  Pt ambulating at min/guard to S level with very slow guarded gait with RW.  Pt reports some difficulty and fatigue with bathing and recommend 3-1 BSC along with OT consult.  Do not feel pt will need any PT follow up at time of d/c.     Follow Up Recommendations No PT follow up    Equipment Recommendations  3in1 (PT)    Recommendations for Other Services OT consult     Precautions / Restrictions Precautions Precautions: Fall Precaution Comments: Fall in hospital while transferring wihtout [facility] from [facility] to bed Restrictions Weight Bearing Restrictions: No      Mobility  Bed Mobility Overal bed mobility: Modified Independent                Transfers Overall transfer level: Modified independent                  Ambulation/Gait Ambulation/Gait assistance: Min guard;Supervision Ambulation Distance (Feet): 108 Feet (with 1 standing rest break) Assistive device: Rolling walker (2 wheeled) Gait Pattern/deviations: Step-through pattern Gait velocity: decreased Gait velocity interpretation: <1.8 ft/sec, indicative of risk for recurrent falls General Gait Details: Pt ambulates  with very slow deliberate gait.  Pt took 1 standing rest break due to feeling like her heart was racing.  HR 70 bpm.  Stairs            Wheelchair Mobility    Modified Rankin (Stroke Patients Only)       Balance Overall balance assessment: History of Falls                                           Pertinent Vitals/Pain Pain Assessment: 0-10 Pain Score: 7  Pain Location: butt and back Pain Descriptors / Indicators: Dull Pain Intervention(s): Monitored during session    Home Living Family/patient expects to be discharged to:: Private residence Living Arrangements: Parent Available Help at Discharge: Family;Available 24 hours/day Type of Home: House Home Access: Stairs to enter Entrance Stairs-Rails: Left;Right;Can reach both Entrance Stairs-Number of Steps: 4-5 Home Layout: One level Home Equipment: Walker - 4 wheels      Prior Function Level of Independence: Needs assistance   Gait / Transfers Assistance Needed: Amb with rollator.  If up without rollator had family member with her.           Hand Dominance   Dominant Hand: Right (but due to injury, writes with L hand)    Extremity/Trunk Assessment   Upper Extremity Assessment: Overall WFL for tasks assessed           Lower Extremity Assessment: Overall [facility]  for tasks assessed      Cervical / Trunk Assessment: Normal  Communication   Communication: No difficulties  Cognition Arousal/Alertness: Awake/alert Behavior During Therapy: WFL for tasks assessed/performed Overall Cognitive Status: Within Functional Limits for tasks assessed                      General Comments      Exercises        Assessment/Plan    PT Assessment Patient needs continued PT services  PT Diagnosis Generalized weakness;Difficulty walking   PT Problem List Decreased activity tolerance;Decreased mobility  PT Treatment Interventions DME instruction;Gait training;Stair training;Functional  mobility training;Therapeutic activities;Therapeutic exercise;Balance training   PT Goals (Current goals can be found in the Care Plan section) Acute Rehab PT Goals Patient Stated Goal: To go home PT Goal Formulation: With patient Time For Goal Achievement: 02/11/15 Potential to Achieve Goals: Good    Frequency Min 3X/week   Barriers to discharge        Co-evaluation               End of Session Equipment Utilized During Treatment: Gait belt Activity Tolerance: Patient limited by fatigue Patient left: in chair;with family/visitor present;with call bell/phone within reach Nurse Communication: Mobility status         Time: 3612-2449 PT Time Calculation (min) (ACUTE ONLY): 34 min   Charges:   PT Evaluation $Initial PT Evaluation Tier I: 1 Procedure PT Treatments $Gait Training: 8-22 mins   PT G Codes:        Brittnie Lewey LUBECK 01/28/2015, 9:52 AM

## 2015-01-28 NOTE — Progress Notes (Signed)
Nutrition Follow-up  DOCUMENTATION CODES:   Severe malnutrition in context of chronic illness  INTERVENTION:    Continue Ensure Enlive po BID, each supplement provides 350 kcal and 20 grams of protein  Continue Magic cup TID with meals, each supplement provides 290 kcal and 9 grams of protein   NUTRITION DIAGNOSIS:   Malnutrition related to chronic illness as evidenced by severe depletion of body fat, severe depletion of muscle mass, percent weight loss.  Ongoing  GOAL:   Patient will meet greater than or equal to 90% of their needs  Met  MONITOR:   PO intake, Supplement acceptance, Labs, I & O's, Weight trends  ASSESSMENT:   Pt with hx of sarcoidosis admitted with prolonged palpitations and low grade fever.   Patient with severe PCM. She has been drinking chocolate Ensure Enlive BID and is receiving Magic Cups with meal trays. Intake has improved, patient is consuming 100% of meals.   Diet Order:  Diet Heart Room service appropriate?: Yes; Fluid consistency:: Thin  Skin:  Reviewed, no issues  Last BM:  9/3  Height:   Ht Readings from Last 1 Encounters:  01/24/15 _0  (1.702 m)    Weight:   Wt Readings from Last 1 Encounters:  01/24/15 132 lb (59.875 kg)    Ideal Body Weight:  61.3 kg  BMI:  Body mass index is 20.67 kg/(m^2).  Estimated Nutritional Needs:   Kcal:  1700-1900  Protein:  75-90 grams  Fluid:  > 1.7 L/day  EDUCATION NEEDS:   No education needs identified at this time  Molli Barrows, [place], [place], [place] Pager [phone] After Hours Pager [phone]

## 2015-01-29 ENCOUNTER — Encounter (HOSPITAL_COMMUNITY): Payer: Self-pay | Admitting: Pulmonary Disease

## 2015-01-29 LAB — BASIC METABOLIC PANEL
ANION GAP: 6 (ref 5–15)
BUN: 8 mg/dL (ref 6–20)
CALCIUM: 8.7 mg/dL — AB (ref 8.9–10.3)
CO2: 25 mmol/L (ref 22–32)
Chloride: 105 mmol/L (ref 101–111)
Creatinine, Ser: 0.46 mg/dL (ref 0.44–1.00)
GLUCOSE: 209 mg/dL — AB (ref 65–99)
POTASSIUM: 4.1 mmol/L (ref 3.5–5.1)
Sodium: 136 mmol/L (ref 135–145)

## 2015-01-29 LAB — CBC
HEMATOCRIT: 33.7 % — AB (ref 36.0–46.0)
Hemoglobin: 10.5 g/dL — ABNORMAL LOW (ref 12.0–15.0)
MCH: 27.9 pg (ref 26.0–34.0)
MCHC: 31.2 g/dL (ref 30.0–36.0)
MCV: 89.4 fL (ref 78.0–100.0)
Platelets: 401 10*3/uL — ABNORMAL HIGH (ref 150–400)
RBC: 3.77 MIL/uL — AB (ref 3.87–5.11)
RDW: 14.8 % (ref 11.5–15.5)
WBC: 14.6 10*3/uL — AB (ref 4.0–10.5)

## 2015-01-29 LAB — GLUCOSE, CAPILLARY: Glucose-Capillary: 138 mg/dL — ABNORMAL HIGH (ref 65–99)

## 2015-01-29 MED ORDER — CYCLOSPORINE 0.05 % OP EMUL: 1.0000 [drp] | Freq: Two times a day (BID) | OPHTHALMIC | Status: DC

## 2015-01-29 MED ORDER — PROPRANOLOL HCL 80 MG PO TABS: 80.0000 mg | ORAL_TABLET | Freq: Two times a day (BID) | ORAL | Status: DC

## 2015-01-29 MED ORDER — LANTUS SOLOSTAR 100 UNIT/ML ~~LOC~~ SOPN: 30.0000 [IU] | PEN_INJECTOR | Freq: Every day | SUBCUTANEOUS | Status: DC

## 2015-01-29 MED ORDER — PREDNISONE 10 MG PO TABS
30.0000 mg | ORAL_TABLET | Freq: Every day | ORAL | Status: DC
Start: 2015-01-29 — End: 2015-04-09

## 2015-01-29 NOTE — Care Management Note (Signed)
Case Management Note  Patient Details  Name: [name] MRN: [id number] Date of Birth: [birth year]  Subjective/Objective:   Pt admitted with HCAP                 Action/Plan:  Pt is from home with parents.  Pt stated she already uses a walker at home, PT has been consulted for evaluation   Expected Discharge Date:                  Expected Discharge Plan:  [place]  In-House Referral:     Discharge planning Services  CM Consult  Post Acute Care Choice:  Patient Choice offered to:     DME Arranged:   3:1 DME Agency:   [place]:    [place] Agency:     Status of Service:  Complete, will sign off  Medicare Important Message Given:    Date Medicare IM Given:    Medicare IM give by:    Date Additional Medicare IM Given:    Additional Medicare Important Message give by:     If discussed at [place] of Stay Meetings, dates discussed:    Additional Comments: CM offered choice, pt chose [place], CM contacted agency, referral was accepted.  Maryclare Labrador, RN 01/29/2015, 3:24 PM

## 2015-01-29 NOTE — Progress Notes (Signed)
Reviewed discharge instructions with patient. IV removed by the tech. Will continue to monitor. Awaiting discharge via wheelchair.   Priyanka Causey, Mervin Kung RN

## 2015-01-29 NOTE — Discharge Instructions (Signed)
1. Stop taking Atenolol. I have prescribed Propanolol for you.  2. Please follow up with family medicine, cardiology, GI, and pulmonology.

## 2015-01-29 NOTE — Telephone Encounter (Signed)
Pt has an appt scheduled with Dr. Lonny Prude for 02/12/2015. Ottis Stain, CMA

## 2015-01-29 NOTE — Progress Notes (Signed)
Family Medicine Teaching Service Daily Progress Note Intern Pager: [phone]  Patient name: [name] Medical record number: [id number] Date of birth: 03-03-1971 Age: 44 y.o. Gender: female  Primary Care Provider: Cordelia Poche, MD Consultants: cards, ID, pulmonology   Code Status: full  Pt Overview and Major Events to Date:  9/3 - admitted with prolonged palpitations and low grade fever 9/5 - CT chest spectrum of findings related to sarcoidosis and dilation of pulmonic trunk likely related to [facility]  9/6 - ICD interogated; Pulm and ID consulted  9/7 - Vancomycin and Ceftriaxone started  9/8 - Bronchoscopy performed  9/9 - Started on PO pred and doxycycline. Fell while transferring from bedside commode to bed. 9/10 - Vanc/ceftriaxone/doxy d/c'd and PO pred switched to IV Solu-Medrol  Assessment and Plan: [name] is a 44 y.o. female presenting with sensation of racing heart and fever at home to 102. PMH is significant for Long QT, history of torsades, AICD in place, DM2, Sarcoidosis  Fevers secondary sarcoidosis: Patient was febrile to 100.7 on admission with lactate to 5.37.  CT chest with findings most likely related to sarcoidosis - perilymphatic distribution of multiple tiny subcentimeter pulmonary nodules and mediastinal lymphadenopathy. Antibiotics discontinued. Became febrile afternoon of 9/5. Underwent bronchoscopy 9/8 with right lower lobe BAL, no hemorrhage noted, transbronchial biopsies not done 2/2 to desat to mid 80s. Lactic acidosis resolved 9/8. Prednisone started 9/9 and switched to IV Solu-Medrol 9/10. - Repeat Bcx from 9/6 NGTD - ID following, appreciate recommendations.  - Pulm following, appreciate recommendations. Received last dose of IV Solu-medrol AM 9/12; starting Prednisone 60 mg at noon 9/12 -BAL cytology with inflammatory cells and no malignancy. BAL AFB cx, fungus cx negative to date. Legionella and pneumocystis pending. - Echo: LVEF 60-65%, PA peak  pressure 58m Hg  Fall, dizziness: Fall on AM of 9/10. No LOC or head trauma. Negative orthostatics. No displaced hip fractures on XR. - Continue tele  Prolonged QTc with history of torsades de pointes, SVT s/p AICD in 2006: No hypokalemia - EP to see as needed - Telemetry  - Continue 416m K-DUR daily   Lupus: Diagnosed per patient in 2005 with no follow up or treatment. ANA negative. - consider rheumatology followup outpatient  -IgG elevated at 2648, anti-smooth muscle antibodies normal, mitochondrial antibodies normal   Rectal Bleeding secondary hemorrhoids: Hemoglobin stable and hemodynamically stable.  - GI following, appreciate recommendations - Docusate 10031maily - Anusol suppositories for 3 days  Asthmatic bronchitis associated with sarcoidosis:  - Budesonide neb 0.80m93mD  DM2: Last A1c 6.19 Dec 2014. Now with steroid induced hyperglycemia. - Lantus increase to 30u QAM - resistant SSI - CBG AC/HS  Weight loss/Anorexia: Approximately 55 lb weight loss from 12/2013 to 01/20/2015 associated with decreased appetite and early satiety. Extensive work up by GI with EGD, CT, US a[place]omen and gastric emptying study noted to be unremarkable in terms of causes of weight loss. Potentially secondary to sardoidosis, and decreased PO intake.  - Ensure Enlive between meals and magic cup with meals   Elevated Alk phos: Alk phos 674, elevated since 09/2014. GHTT on 01/15/2015 elevated to 372. Normal bilirubin and liver transaminases. US n[place]ative for liver or gall bladder pathology. Negative Hep B and Hep C serologies.  - GI following, appreciate recommendations - Likely secondary to sarcoidosis  Hepatosplenomegaly: persistent since being noted on 10/25/2014, now with concern for gastric varices and nodular liver edge concerning for cirrhosis. ? Possible amyloidosis given elevated ALP, previously elevated  T protein even with low albumin. No monoclonal protein abnormality. SPEP nonspecific. Iron  studies with elevated ferritin - increased as an acute phase reactant.  - IgG elevated   FEN/GI: Carb modified diet Prophylaxis: Lovenox  Disposition: home pending clinical improvement and further workup  Subjective:  No acute events overnight. Ambulating well. Continues to feel better. Denies CP or shortness of breath. Did well with oral prednisone.   Objective: Temp:  [97.2 F (36.2 C)-97.8 F (36.6 C)] 97.8 F (36.6 C) (09/13 0547) Pulse Rate:  [57-74] 74 (09/13 0547) Resp:  [17-18] 17 (09/13 0547) BP: (129-145)/(84-93) 139/84 mmHg (09/13 0547) SpO2:  [94 %-98 %] 96 % (09/13 0840) Physical Exam: General: Thin-appearing female, sitting up on the side of the bed, in NAD HEENT: [place]/AT, EOMI, MMM Cardiovascular: RRR, no murmurs Respiratory: Bibasilar rales but otherwise clear, normal work of breathing Abdomen: soft, mildly distended Skin: No rashes or lesion Neuro: No focal deficits Psych: normal mood and affect  Laboratory:  Recent Labs Lab 01/27/15 0409 01/28/15 0220 01/29/15 0421  WBC 7.3 11.7* 14.6*  HGB 10.5* 10.4* 10.5*  HCT 33.1* 33.6* 33.7*  PLT 377 378 401*    Recent Labs Lab 01/27/15 0409 01/28/15 0220 01/29/15 0421  NA 137 134* 136  K 4.4 4.5 4.1  CL 103 102 105  CO2 _0 BUN _1 CREATININE 0.53 0.56 0.46  CALCIUM 9.1 8.6* 8.7*  GLUCOSE 386* 427* 209*    Imaging/Diagnostic Tests: 9/3 CT abd/pelvis  1. Diffuse tiny nodular opacities within the right lower lung lobe and right middle lung lobe raise suspicion for atypical pneumonia. This is new from the prior study. 2. Splenomegaly and hepatomegaly again noted. 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 Apparent gastric varices suggested, not well seen without contrast.  9/3 CXR No active cardiopulmonary disease.  Nicolette Bang, DO 01/29/2015, 9:45 AM PGY-1, [place] Intern pager: [phone], text pages welcome

## 2015-01-29 NOTE — Progress Notes (Signed)
Patient needing a three in 1 potty chair but her ride was here so patient would like it to be delivered to her house.   Noella Kipnis, Mervin Kung RN

## 2015-01-30 LAB — HSV CULTURE AND TYPING

## 2015-01-30 LAB — MISCELLANEOUS TEST

## 2015-02-08 ENCOUNTER — Ambulatory Visit (INDEPENDENT_AMBULATORY_CARE_PROVIDER_SITE_OTHER): Payer: Medicaid Other | Admitting: Gastroenterology

## 2015-02-08 ENCOUNTER — Encounter: Payer: Self-pay | Admitting: Gastroenterology

## 2015-02-08 VITALS — BP 122/70 | HR 76 | Ht 66.73 in | Wt 136.5 lb

## 2015-02-08 DIAGNOSIS — R7989 Other specified abnormal findings of blood chemistry: Secondary | ICD-10-CM | POA: Diagnosis not present

## 2015-02-08 DIAGNOSIS — K5901 Slow transit constipation: Secondary | ICD-10-CM | POA: Diagnosis not present

## 2015-02-08 DIAGNOSIS — R945 Abnormal results of liver function studies: Secondary | ICD-10-CM

## 2015-02-08 DIAGNOSIS — R112 Nausea with vomiting, unspecified: Secondary | ICD-10-CM | POA: Diagnosis not present

## 2015-02-08 NOTE — Patient Instructions (Signed)
Follow up as needed

## 2015-02-08 NOTE — Assessment & Plan Note (Signed)
Cholestatic LFTs are very likely related to sarcoidosis involving the liver.  She has hepatosplenomegaly by CT scan and probably has underlying chronic liver disease related to sarcoidosis.  Workup for other etiologies of chronic liver disease is negative.

## 2015-02-08 NOTE — Assessment & Plan Note (Signed)
Improved with Linzess.  Plan to continue with the same.

## 2015-02-08 NOTE — Assessment & Plan Note (Signed)
Etiology has not been determined.  She's had an extensive workup including after [place] scan and upper endoscopy.  Constipation may be contributing.  Has constipation has improved her nausea vomiting have also improved.  Plan to continue Zofran when necessary

## 2015-02-08 NOTE — Progress Notes (Signed)
_                                                                                                                History of Present Illness:  [name] has returned following hospitalization for fever and pulmonary nodules.  From a GI standpoint she's feeling much improved.  She moves her bowels regularly with Linzess.  Nausea is intermittent and may occur a couple of times a week.  Zofran is helpful.  Extensive GI history can be summarized as follows:  [name] is a 44 y.o. female. 17 + yr hx typ 1 DM. Long QT syndrome, s/p ICD. SVT, s/p ablation. Sarcoidosis. Stable pulmonary nodules. IBS-C treated with Amitiza. Fibromyalgia and peripheral neuropathy.   03/2010 flexible sigmoidoscopy with band ligation of internal hemorrhoids. 04/2009 flexible sigmoidoscopy for rectal bleeding. Study revealed internal hemorrhoids. 01/2008 EGD or dysphagia and reflux symptoms. Distal esophageal stricture was dilated, Bravo pH probe placed. 11/2004 colonoscopy for history constipation. Normal study to cecum.. 01/2004 EGD. For reflux symptoms. Noted distal esophagitis and distal esophageal stricture which was dilated.   Since the early part of 2016, in January/February the patient has had issues with nausea, vomiting and diffuse upper abdominal pain. She's lost at least 40 pounds. Labs show a steady increase of alkaline phosphatase. It was 417 on 10/03/2014, 508 on 10/22/2014, 570 on 11/22/14. Transaminases and total bilirubin has been normal with slight increase T bili to 1.5 in 11/2014.  10/08/2014 abdominal ultrasound showed no gallbladder or hepatobiliary abnormality. Increased renal echo texture possibly reflecting medical renal disease. 10/24/14 Upper endoscopy: Normal 10/25/14 oral contrast only CT abdomen pelvis: Probable mild hepatosplenomegaly. Enlarged uterus containing large left leiomyoma.  Dr. Deatra Ina suggested gastric emptying study 11/06/14 TAH/BSO, for dysfunctional uterine  bleeding and enlarging fibroid, at [facility]. Returned to surgery with ex lap and hematoma evacuation within a few hours due to hypotension and acute drop Hgb to 3. Transfuse "7" units blood and 2 FFP according to discharge summary.  11/21/17 inpt GI eval for ongoing n/v. Not compliant with Nexium due to cost also taking narcotics. Admission due to syncopal episodes, toursades which triggered ICD to fire noted on device interrogation.  12/03/14 Gastric emptying study normal. Advised to take daily PPI (generic fine) and stop narcotics.   At GI OV 12/11/14: Linzess initiated in place of Amitiza. Alk phos 1047 on 7/21. GGT elevated at 372, 5' nucleotidase elevated at 14. Outpt IgG, ANA, AMA, smooth muscle Ab ordered but never drawn Rx for Zofran phoned in for n/v on 8/29.     Past Medical History  Diagnosis Date  . Long Q-T syndrome   . Palpitation   . Seizure disorder     none recently  . Abdominal pain, periumbilic   . Esophageal stricture 2005/2009    esophageal strictures dilated 2005, 2009  . Sarcoidosis   . Allergic rhinitis   . Bronchitis   . GERD (gastroesophageal reflux disease) 2005  . Asthma   . Fibromyalgia   .  Stroke     QUESTIONABLE TIA   . Neuropathy in diabetes   . Seizures     HISTORY OF TAKING KLONIPIN NOT RX AT THE MOMENT  . Cancer     Nodules in lungs pt believes were cancerous pt unsure  . Arthritis   . Internal hemorrhoids 2010    2011 band ligation of int rrhoids.  . Eye hemorrhage     bilateral  . Fibroid, uterine   . Automatic implantable cardioverter-defibrillator in situ 2006, replaced 2008    MEDTRONIC  . DM (diabetes mellitus) 1997/1998    type 1, initially gestational but soon after child birth developed IDDM  . History of blood transfusion years ago   Past Surgical History  Procedure Laterality Date  . Tubal ligation    . Hand surgery    . Svt ablation    . Automatic implantable cardiac defibrillator situ  2006,      ICD-Medtronic   Remote - Yes   . Hemorrhoid banding  03/2010  . Dilitation & currettage/hystroscopy with hydrothermal ablation N/A 06/07/2013    Procedure: DILATATION & CURETTAGE/HYSTEROSCOPY WITH attempted HYDROTHERMAL ABLATION;  Surgeon: Frederico Hamman, MD;  Location: [facility];  Service: Gynecology;  Laterality: N/A;  . Esophagogastroduodenoscopy (egd) with propofol N/A 10/24/2014    Procedure: ESOPHAGOGASTRODUODENOSCOPY (EGD) WITH PROPOFOL;  Surgeon: Inda Castle, MD;  Location: [facility];  Service: Endoscopy;  Laterality: N/A;  . Total abdominal hysterectomy w/ bilateral salpingoophorectomy  11/06/14    at [facility]. for menorrhagia, pelvic pain and enlarging uterine fibroids  . Exploratory laparotomy  11/06/14    evacuation of post TAH/BSO hematoma  . Abdominal hysterectomy    . Salpingectomy    . Cervix removal    . Video bronchoscopy Bilateral 01/24/2015    Procedure: VIDEO BRONCHOSCOPY WITHOUT FLUORO;  Surgeon: Marshell Garfinkel, MD;  Location: [place];  Service: Cardiopulmonary;  Laterality: Bilateral;   family history includes Breast cancer in her maternal aunt; Lung cancer in her maternal uncle; Stomach cancer in her paternal grandfather, paternal grandmother, and paternal uncle. Current Outpatient Prescriptions  Medication Sig Dispense Refill  . acetaminophen (TYLENOL) 500 MG tablet Take 1,000 mg by mouth every 6 (six) hours as needed for mild pain or fever.    . beclomethasone (QVAR) 80 MCG/ACT inhaler Inhale 2 puffs into the lungs 2 (two) times daily. 1 Inhaler 5  . clonazePAM (KLONOPIN) 0.5 MG tablet Take 1 tablet (0.5 mg total) by mouth daily. 30 tablet 0  . cyclobenzaprine (FLEXERIL) 10 MG tablet Take 10 mg by mouth 3 (three) times daily as needed for muscle spasms.     . cycloSPORINE (RESTASIS) 0.05 % ophthalmic emulsion Place 1 drop into both eyes 2 (two) times daily. 0.4 mL 0  . fluticasone (FLONASE) 50 MCG/ACT nasal spray Place 2 sprays into both nostrils daily as needed  for allergies.     Marland Kitchen gabapentin (NEURONTIN) 100 MG capsule Take 1 capsule (100 mg total) by mouth 3 (three) times daily. 90 capsule 2  . glimepiride (AMARYL) 2 MG tablet Take 2 mg by mouth 2 (two) times daily at 8 am and 10 pm.     . LANTUS SOLOSTAR 100 UNIT/ML Solostar Pen Inject 30 Units into the skin at bedtime. 15 mL 0  . Linaclotide (LINZESS) 145 MCG CAPS capsule Take 1 capsule (145 mcg total) by mouth daily. 30 capsule 3  . meclizine (ANTIVERT) 25 MG tablet Take 25 mg by mouth 3 (three) times daily as needed for dizziness (  take 1/2 to 1 tablet by mouth as needed for dizziness).    . NOVOLOG FLEXPEN 100 UNIT/ML FlexPen Inject 4-8 Units into the skin 3 (three) times daily.  0  . Olopatadine HCl (PATADAY) 0.2 % SOLN Place 1 drop into both eyes 2 (two) times daily.     . ondansetron (ZOFRAN) 4 MG tablet Take 1 tablet (4 mg total) by mouth every 6 (six) hours as needed for nausea or vomiting. 60 tablet 0  . pantoprazole (PROTONIX) 40 MG tablet Take 1 tablet (40 mg total) by mouth daily at 6 (six) AM. 30 tablet 2  . potassium chloride SA (K-DUR,KLOR-CON) 20 MEQ tablet Take 1 tablet (20 mEq total) by mouth daily. 30 tablet 2  . predniSONE (DELTASONE) 10 MG tablet Take 3 tablets (30 mg total) by mouth daily with breakfast. 90 tablet 1  . propranolol (INDERAL) 80 MG tablet Take 1 tablet (80 mg total) by mouth 2 (two) times daily. 30 tablet 1   No current facility-administered medications for this visit.   Allergies as of 02/08/2015 - Review Complete 02/08/2015  Allergen Reaction Noted  . Contrast media [iodinated diagnostic agents] Anaphylaxis and Shortness Of Breath 05/30/2013  . Augmentin [amoxicillin-pot clavulanate] Other (See Comments) 10/23/2014  . Codeine Nausea And Vomiting   . Iohexol  05/28/2006  . Meperidine hcl Nausea And Vomiting   . Morphine and related Nausea And Vomiting 05/30/2013    reports that she has never smoked. She has never used smokeless tobacco. She reports that she  drinks alcohol. She reports that she does not use illicit drugs.   Review of Systems: Pertinent positive and negative review of systems were noted in the above HPI section. All other review of systems were otherwise negative.  Vital signs were reviewed in today's medical record Physical Exam: General: Chronically ill-appearing in no acute distress Skin: anicteric Head: Normocephalic and atraumatic Eyes:  sclerae anicteric, EOMI Ears: Normal auditory acuity Mouth: No deformity or lesions Neck: Supple, no masses or thyromegaly Lymph Nodes: no lymphadenopathy Lungs: Clear throughout to auscultation Heart: Regular rate and rhythm; no murmurs, rubs or bruits Gastroinestinal: Soft, and non distended. No masses, hepatosplenomegaly or hernias noted. Normal Bowel sounds.  Mildly diffusely tender Rectal:deferred Musculoskeletal: Symmetrical with no gross deformities  Skin: No lesions on visible extremities Pulses:  Normal pulses noted Extremities: No clubbing, cyanosis, edema or deformities noted Neurological: Alert oriented x 4, grossly nonfocal Cervical Nodes:  No significant cervical adenopathy Inguinal Nodes: No significant inguinal adenopathy Psychological:  Alert and cooperative. Normal mood and affect  See Assessment and Plan under Problem List

## 2015-02-11 ENCOUNTER — Inpatient Hospital Stay: Payer: Medicaid Other | Admitting: Adult Health

## 2015-02-12 ENCOUNTER — Ambulatory Visit (INDEPENDENT_AMBULATORY_CARE_PROVIDER_SITE_OTHER): Payer: Medicaid Other | Admitting: Family Medicine

## 2015-02-12 ENCOUNTER — Encounter: Payer: Self-pay | Admitting: Family Medicine

## 2015-02-12 VITALS — BP 111/76 | HR 73 | Temp 98.2°F | Ht 67.0 in | Wt 137.0 lb

## 2015-02-12 DIAGNOSIS — R2681 Unsteadiness on feet: Secondary | ICD-10-CM | POA: Diagnosis not present

## 2015-02-12 DIAGNOSIS — E118 Type 2 diabetes mellitus with unspecified complications: Secondary | ICD-10-CM

## 2015-02-12 DIAGNOSIS — R269 Unspecified abnormalities of gait and mobility: Secondary | ICD-10-CM | POA: Insufficient documentation

## 2015-02-12 DIAGNOSIS — D869 Sarcoidosis, unspecified: Secondary | ICD-10-CM | POA: Diagnosis not present

## 2015-02-12 DIAGNOSIS — R5382 Chronic fatigue, unspecified: Secondary | ICD-10-CM

## 2015-02-12 DIAGNOSIS — D860 Sarcoidosis of lung: Secondary | ICD-10-CM

## 2015-02-12 NOTE — Progress Notes (Signed)
    Subjective   [name] is a 44 y.o. female that presents for a hospital follow-up  1. Sarcoidosis flare hospital follow-up: Patient was admitted for sarcoidosis flare. She has been taking prednisone 30mg  daily. She is scheduled to see her pulmonologist on Monday October 3. She was started on propranolol 80mg  BID. She was decreased to Lantus 30u but has been taking 35u daily. Her CBGs are ranging from 80s to 130s. She is also taking glimepiride 2mg  BID.   ROS Per HPI  Social History  Substance Use Topics  . Smoking status: Never Smoker   . Smokeless tobacco: Never Used  . Alcohol Use: 0.0 oz/week    0 Standard drinks or equivalent per week     Comment: occ.    Allergies  Allergen Reactions  . Contrast Media [Iodinated Diagnostic Agents] Anaphylaxis and Shortness Of Breath    Needed to be defibrillated   . Augmentin [Amoxicillin-Pot Clavulanate] Other (See Comments)    "stomach hurt"  . Codeine Nausea And Vomiting    jittery  . Iohexol      Code: RASH, Desc: PT STATES SHE IS ALLERGIC TO IV CONTRAST 05/28/06/RM, Onset Date: [id number]   . Meperidine Hcl Nausea And Vomiting  . Morphine And Related Nausea And Vomiting    Objective   BP 111/76 mmHg  Pulse 73  Temp(Src) 98.2 F (36.8 C) (Oral)  Ht 5\' 7"  (1.702 m)  Wt 137 lb (62.143 kg)  BMI 21.45 kg/m2  LMP 07/23/2014  General: Well appearing, thin, no distress Respiratory/Chest: Clear to auscultation bilaterally, no wheezing Cardiovascular: Regular rate and rhythm Gastrointestinal: Soft, mild tenderness in RUQ, bowel sounds normal   Assessment and Plan   No orders of the defined types were placed in this encounter.    Diabetes mellitus, type 2 Discontinue glimepiride. Watch blood sugars. May need to reduce insulin eventually if patient comes down on steroids.  Sarcoidosis, stage 3 Appears improved from flare. Still on prednisone. Patient to see pulmonologist soon. Will follow-up plan at that time.

## 2015-02-12 NOTE — Patient Instructions (Signed)
Thank you for coming to see me today. It was a pleasure. Today we talked about:   Sarcoidosis: I'm glad you are doing better. I await to see the results of your pulmonology follow-up. I have written a prescription for a cane and will place a referral for home health physical therapy.  Diabetes: We decided to discontinue your glimepiride to minimize the chances of you having low blood sugars. We can discuss this further after your pulmonology follow-up.  Please make an appointment to see me in 2-4 weeks for follow-up.  If you have any questions or concerns, please do not hesitate to call the office at [phone].  Sincerely,  Cordelia Poche, MD

## 2015-02-13 LAB — LEGIONELLA CULTURE

## 2015-02-14 ENCOUNTER — Encounter: Payer: Self-pay | Admitting: Clinical

## 2015-02-14 NOTE — Progress Notes (Signed)
CSW informed by [facility] and [facility] that they cannot accept the order for [facility] PT. CSW has sent the order to [facility].  Hunt Oris, MSW, [place]

## 2015-02-15 NOTE — Assessment & Plan Note (Signed)
Discontinue glimepiride. Watch blood sugars. May need to reduce insulin eventually if patient comes down on steroids.

## 2015-02-15 NOTE — Progress Notes (Signed)
CSW received a call from Jarome Lamas with [facility] that pt does not have a qualifying PT dx. Unfortunately Medicaid would not approve PT at this time.  Hunt Oris, MSW, [place]

## 2015-02-15 NOTE — Assessment & Plan Note (Signed)
Appears improved from flare. Still on prednisone. Patient to see pulmonologist soon. Will follow-up plan at that time.

## 2015-02-18 ENCOUNTER — Encounter: Payer: Self-pay | Admitting: Adult Health

## 2015-02-18 ENCOUNTER — Ambulatory Visit (INDEPENDENT_AMBULATORY_CARE_PROVIDER_SITE_OTHER): Payer: Medicaid Other | Admitting: Adult Health

## 2015-02-18 VITALS — BP 126/74 | HR 76 | Temp 97.6°F | Ht 66.0 in | Wt 139.4 lb

## 2015-02-18 DIAGNOSIS — J449 Chronic obstructive pulmonary disease, unspecified: Secondary | ICD-10-CM | POA: Diagnosis not present

## 2015-02-18 DIAGNOSIS — D869 Sarcoidosis, unspecified: Secondary | ICD-10-CM

## 2015-02-18 DIAGNOSIS — D860 Sarcoidosis of lung: Secondary | ICD-10-CM

## 2015-02-18 NOTE — Progress Notes (Signed)
  Subjective:    Patient ID: [name], female    DOB: 1970/11/20   MRN: [id number]  Brief patient profile:  This is a 44 y.o. , African-American female with history of sarcoidosis and associated asthmatic bronchitis. The patient also had chronic chest pain has been thoroughly evaluated by cardiology. She does have a history of long QT syndrome and has an ICD placed in 2006.   02/18/2015 [facility]  : Chronic cough , sarcoid and asthma bronchitis  Pt returns for follow up from recent hospital stay .  Admitted with sarcoid flare , started on steroids  Discharged on prednisone 30mg  daily. She was treated for possible bronchitis with abx.  She underwent FOB with BAL . No bx were taken due to desats during procedure.  CX with 10K staph.  CT chest showed sarcoid changes.  CT abd showed some heptospleenomegaly ACE level was very high at 177.   Echo showed EF 60-65%, PAP 36.  She is feeling better. Has some lingering dry cough  Denies chest pain, rash , edema or fever. No hemopytisis.  She has chronic cough /RAD , on QVAR .   Of note changed to propanolol 80mg  Twice daily  At discharge. She is followed by cards for prolonged QT syndrome and SVT s/p ICD implantation .     Current Medications, Allergies, Complete Past Medical History, Past Surgical History, Family History, and Social History were reviewed in [employer] record.  ROS  The following are not active complaints unless bolded sore throat, dysphagia, dental problems, itching, sneezing,    fever, chills, sweats, unintended wt loss, pleuritic or exertional cp, hemoptysis,  orthopnea pnd or leg swelling, presyncope, palpitations, heartburn, abdominal pain, anorexia, nausea, vomiting, diarrhea  or change in bowel or urinary habits, change in stools or urine, dysuria,hematuria,  rash, arthralgias, visual complaints, headache, numbness weakness or ataxia or problems with walking or coordination,  change in  mood/affect or memory.             Objective:   Physical Exam  Vital signs reviewed    Gen: Pleasant,, in no distress,  normal affect  ENT: No lesions,  mouth clear,  oropharynx clear, no postnasal drip  Neck: No JVD, no TMG, no carotid bruits  Lungs: No use of accessory muscles, no dullness to percussion, no wheezing   Cardiovascular: RRR, heart sounds normal, no murmur or gallops, no peripheral edema , left sided ICD noted   Abdomen: soft and NT, no HSM,  BS normal  Musculoskeletal: No deformities, no cyanosis or clubbing  Neuro: alert, non focal  Skin: Warm, no lesions or rashes     Assessment & Plan:

## 2015-02-18 NOTE — Patient Instructions (Addendum)
Decrease Prednisone 20mg  daily .and hold at this dose until seen back  Continue on QVAR Twice daily   Follow up Dr. Vaughan Browner in 3-4 weeks and As needed   Please contact office for sooner follow up if symptoms do not improve or worsen or seek emergency care

## 2015-02-18 NOTE — Assessment & Plan Note (Signed)
Recent flare with sarcoid , improved with abx and steroids  Pt with chronic cough and RAD . Remains on BB, recently changed to propanolol .  Cont to monitor , may need to change to more selective BB if wheezing /cough worsen.

## 2015-02-18 NOTE — Assessment & Plan Note (Addendum)
Recent flare now improving  Will taper steroids slowly  Consider repeat ACE level at follow up   Plan  Decrease Prednisone 20mg  daily .and hold at this dose until seen back  Continue on QVAR Twice daily   Follow up Dr. Vaughan Browner in 3-4 weeks and As needed   Please contact office for sooner follow up if symptoms do not improve or worsen or seek emergency care

## 2015-02-20 LAB — FUNGUS CULTURE W SMEAR: FUNGAL SMEAR: NONE SEEN

## 2015-02-28 ENCOUNTER — Other Ambulatory Visit: Payer: Self-pay | Admitting: Family Medicine

## 2015-02-28 DIAGNOSIS — R2681 Unsteadiness on feet: Secondary | ICD-10-CM

## 2015-03-03 ENCOUNTER — Emergency Department (HOSPITAL_COMMUNITY): Payer: Medicaid Other

## 2015-03-03 ENCOUNTER — Emergency Department (HOSPITAL_COMMUNITY)
Admission: EM | Admit: 2015-03-03 | Discharge: 2015-03-03 | Discharge status: Against Medical Advice | Payer: Medicaid Other | Attending: Emergency Medicine | Admitting: Emergency Medicine

## 2015-03-03 ENCOUNTER — Encounter (HOSPITAL_COMMUNITY): Payer: Self-pay | Admitting: Emergency Medicine

## 2015-03-03 DIAGNOSIS — Z8673 Personal history of transient ischemic attack (TIA), and cerebral infarction without residual deficits: Secondary | ICD-10-CM | POA: Diagnosis not present

## 2015-03-03 DIAGNOSIS — R000 Tachycardia, unspecified: Secondary | ICD-10-CM

## 2015-03-03 DIAGNOSIS — Z859 Personal history of malignant neoplasm, unspecified: Secondary | ICD-10-CM | POA: Diagnosis not present

## 2015-03-03 DIAGNOSIS — J690 Pneumonitis due to inhalation of food and vomit: Secondary | ICD-10-CM

## 2015-03-03 DIAGNOSIS — Z7951 Long term (current) use of inhaled steroids: Secondary | ICD-10-CM | POA: Insufficient documentation

## 2015-03-03 DIAGNOSIS — G40909 Epilepsy, unspecified, not intractable, without status epilepticus: Secondary | ICD-10-CM | POA: Insufficient documentation

## 2015-03-03 DIAGNOSIS — Z86018 Personal history of other benign neoplasm: Secondary | ICD-10-CM | POA: Diagnosis not present

## 2015-03-03 DIAGNOSIS — Z79899 Other long term (current) drug therapy: Secondary | ICD-10-CM | POA: Insufficient documentation

## 2015-03-03 DIAGNOSIS — M1990 Unspecified osteoarthritis, unspecified site: Secondary | ICD-10-CM | POA: Diagnosis not present

## 2015-03-03 DIAGNOSIS — T82121A Displacement of cardiac pulse generator (battery), initial encounter: Secondary | ICD-10-CM | POA: Insufficient documentation

## 2015-03-03 DIAGNOSIS — Z45010 Encounter for checking and testing of cardiac pacemaker pulse generator [battery]: Secondary | ICD-10-CM

## 2015-03-03 DIAGNOSIS — R079 Chest pain, unspecified: Secondary | ICD-10-CM | POA: Diagnosis present

## 2015-03-03 DIAGNOSIS — D869 Sarcoidosis, unspecified: Secondary | ICD-10-CM | POA: Diagnosis not present

## 2015-03-03 DIAGNOSIS — Y712 Prosthetic and other implants, materials and accessory cardiovascular devices associated with adverse incidents: Secondary | ICD-10-CM | POA: Insufficient documentation

## 2015-03-03 LAB — BASIC METABOLIC PANEL
ANION GAP: 10 (ref 5–15)
BUN: <5 mg/dL — ABNORMAL LOW (ref 6–20)
CHLORIDE: 94 mmol/L — AB (ref 101–111)
CO2: 28 mmol/L (ref 22–32)
CREATININE: 0.46 mg/dL (ref 0.44–1.00)
Calcium: 9.3 mg/dL (ref 8.9–10.3)
GFR calc non Af Amer: >60 mL/min (ref >60)
Glucose, Bld: 264 mg/dL — ABNORMAL HIGH (ref 65–99)
POTASSIUM: 3.4 mmol/L — AB (ref 3.5–5.1)
Sodium: 132 mmol/L — ABNORMAL LOW (ref 135–145)

## 2015-03-03 LAB — CBC
HEMATOCRIT: 45.3 % (ref 36.0–46.0)
HEMOGLOBIN: 14.9 g/dL (ref 12.0–15.0)
MCH: 28.2 pg (ref 26.0–34.0)
MCHC: 32.9 g/dL (ref 30.0–36.0)
MCV: 85.6 fL (ref 78.0–100.0)
Platelets: 270 10*3/uL (ref 150–400)
RBC: 5.29 MIL/uL — AB (ref 3.87–5.11)
RDW: 14.6 % (ref 11.5–15.5)
WBC: 6.5 10*3/uL (ref 4.0–10.5)

## 2015-03-03 LAB — I-STAT TROPONIN, ED: Troponin i, poc: 0 ng/mL (ref 0.00–0.08)

## 2015-03-03 MED ORDER — AZITHROMYCIN 500 MG IV SOLR
500.0000 mg | Freq: Once | INTRAVENOUS | Status: DC
Start: 2015-03-03 — End: 2015-03-03

## 2015-03-03 MED ORDER — AZITHROMYCIN 250 MG PO TABS
500.0000 mg | ORAL_TABLET | Freq: Once | ORAL | Status: AC
  Administered 2015-03-03: 500 mg via ORAL
  Filled 2015-03-03: qty 2

## 2015-03-03 MED ORDER — DEXTROSE 5 % IV SOLN
1.0000 g | Freq: Once | INTRAVENOUS | Status: DC
  Filled 2015-03-03: qty 10

## 2015-03-03 MED ORDER — METHYLPREDNISOLONE SODIUM SUCC 125 MG IJ SOLR
125.0000 mg | Freq: Once | INTRAMUSCULAR | Status: DC
  Filled 2015-03-03: qty 2

## 2015-03-03 MED ORDER — ASPIRIN 81 MG PO CHEW
324.0000 mg | CHEWABLE_TABLET | Freq: Once | ORAL | Status: AC
  Administered 2015-03-03: 324 mg via ORAL
  Filled 2015-03-03: qty 4

## 2015-03-03 MED ORDER — AZITHROMYCIN 250 MG PO TABS: 250.0000 mg | ORAL_TABLET | Freq: Every day | ORAL | Status: DC

## 2015-03-03 MED ORDER — SODIUM CHLORIDE 0.9 % IV BOLUS (SEPSIS): 1000.0000 mL | Freq: Once | INTRAVENOUS | Status: DC

## 2015-03-03 NOTE — Discharge Instructions (Signed)
The cardiology office should contact you tomorrow regarding replacement of your AICD battery. Please follow-up with your Dr. tomorrow. Please take all antibiotics as prescribed. Return here if you are worse at any time or decided you would consider admission.  Community-Acquired Pneumonia, Adult Pneumonia is an infection of the lungs. One type of pneumonia can happen while a person is in a hospital. A different type can happen when a person is not in a hospital (community-acquired pneumonia). It is easy for this kind to spread from person to person. It can spread to you if you breathe near an infected person who coughs or sneezes. Some symptoms include:  A dry cough.  A wet (productive) cough.  Fever.  Sweating.  Chest pain. HOME CARE  Take over-the-counter and prescription medicines only as told by your doctor.  Only take cough medicine if you are losing sleep.  If you were prescribed an antibiotic medicine, take it as told by your doctor. Do not stop taking the antibiotic even if you start to feel better.  Sleep with your head and neck raised (elevated). You can do this by putting a few pillows under your head, or you can sleep in a recliner.  Do not use tobacco products. These include cigarettes, chewing tobacco, and e-cigarettes. If you need help quitting, ask your doctor.  Drink enough water to keep your pee (urine) clear or pale yellow. A shot (vaccine) can help prevent pneumonia. Shots are often suggested for:  People older than 43 years of age.  People older than 43 years of age:  Who are having cancer treatment.  Who have long-term (chronic) lung disease.  Who have problems with their body's defense system (immune system). You may also prevent pneumonia if you take these actions:  Get the flu (influenza) shot every year.  Go to the dentist as often as told.  Wash your hands often. If soap and water are not available, use hand sanitizer. GET HELP IF:  You have a  fever.  You lose sleep because your cough medicine does not help. GET HELP RIGHT AWAY IF:  You are short of breath and it gets worse.  You have more chest pain.  Your sickness gets worse. This is very serious if:  You are an older adult.  Your body's defense system is weak.  You cough up blood.   This information is not intended to replace advice given to you by your health care provider. Make sure you discuss any questions you have with your health care provider.   Document Released: 10/21/2007 Document Revised: 01/23/2015 Document Reviewed: 08/29/2014 Elsevier Interactive Patient Education [employer].

## 2015-03-03 NOTE — ED Notes (Signed)
Pt from home via [facility] with c/o hearing "beeping" from her AICD this morning.  Pt reports she knows her battery is low and tried to call her cardiologist with no answer so called EMS.  Pt reported feeling mild palpitations when it was beeping.  During transport pt began having 5/10 chest tightness radiating to right chest.  Pt given 1 nitro with no relief, refused 324 mg aspirin.  12 lead unremarkable.  Pt has cough remaining from sarcoidosis flare. Pt in NAD, A&O.

## 2015-03-03 NOTE — ED Provider Notes (Signed)
CSN: [id number]     Arrival date & time 03/03/15  1051 History   First MD Initiated Contact with Patient 03/03/15 1136     Chief Complaint  Patient presents with  . Chest Pain  . AICD Problem     (Consider location/radiation/quality/duration/timing/severity/associated sxs/prior Treatment) HPI  44 year old female history of prolonged QT sister has an automatic internal defibrillator. She states that it made a funny noise today. She is concerned because she thinks that her battery may be low. She denies being shocked. She denies any chest pain or dyspnea. She is presenting because of the strange noises made. She states that she did have some palpitations and was apparently given nitroglycerin prehospital but refused aspirin. She states that she has some coughing with is consistent with her sarcoidosis flare.  Past Medical History  Diagnosis Date  . Long Q-T syndrome   . Palpitation   . Seizure disorder ([place])     none recently  . Abdominal pain, periumbilic   . Esophageal stricture 2005/2009    esophageal strictures dilated 2005, 2009  . Sarcoidosis ([place])   . Allergic rhinitis   . Bronchitis   . GERD (gastroesophageal reflux disease) 2005  . Asthma   . Fibromyalgia   . Stroke ([place])     QUESTIONABLE TIA   . Neuropathy in diabetes ([place])   . Seizures ([place])     HISTORY OF TAKING KLONIPIN NOT RX AT THE MOMENT  . Cancer ([place])     Nodules in lungs pt believes were cancerous pt unsure  . Arthritis   . Internal hemorrhoids 2010    2011 band ligation of int rrhoids.  . Eye hemorrhage     bilateral  . Fibroid, uterine   . Automatic implantable cardioverter-defibrillator in situ 2006, replaced 2008    MEDTRONIC  . DM (diabetes mellitus) ([place]) 1997/1998    type 1, initially gestational but soon after child birth developed IDDM  . History of blood transfusion years ago   Past Surgical History  Procedure Laterality Date  . Tubal ligation    . Hand surgery    . Svt ablation    .  Automatic implantable cardiac defibrillator situ  2006,     ICD-Medtronic   Remote - Yes   . Hemorrhoid banding  03/2010  . Dilitation & currettage/hystroscopy with hydrothermal ablation N/A 06/07/2013    Procedure: DILATATION & CURETTAGE/HYSTEROSCOPY WITH attempted HYDROTHERMAL ABLATION;  Surgeon: Frederico Hamman, MD;  Location: [facility];  Service: Gynecology;  Laterality: N/A;  . Esophagogastroduodenoscopy (egd) with propofol N/A 10/24/2014    Procedure: ESOPHAGOGASTRODUODENOSCOPY (EGD) WITH PROPOFOL;  Surgeon: Inda Castle, MD;  Location: [facility];  Service: Endoscopy;  Laterality: N/A;  . Total abdominal hysterectomy w/ bilateral salpingoophorectomy  11/06/14    at [facility] for menorrhagia, pelvic pain and enlarging uterine fibroids  . Exploratory laparotomy  11/06/14    evacuation of post TAH/BSO hematoma  . Abdominal hysterectomy    . Salpingectomy    . Cervix removal    . Video bronchoscopy Bilateral 01/24/2015    Procedure: VIDEO BRONCHOSCOPY WITHOUT FLUORO;  Surgeon: Marshell Garfinkel, MD;  Location: [facility];  Service: Cardiopulmonary;  Laterality: Bilateral;   Family History  Problem Relation Age of Onset  . Lung cancer Maternal Uncle     x2  . Breast cancer Maternal Aunt     x2  . Stomach cancer Paternal Uncle   . Stomach cancer Paternal Grandfather   . Stomach cancer Paternal Grandmother  Social History  Substance Use Topics  . Smoking status: Never Smoker   . Smokeless tobacco: Never Used  . Alcohol Use: 0.0 oz/week    0 Standard drinks or equivalent per week     Comment: occ.   OB History    No data available     Review of Systems  All other systems reviewed and are negative.     Allergies  Contrast media; Augmentin; Codeine; Iohexol; Meperidine hcl; and Morphine and related  Home Medications   Prior to Admission medications   Medication Sig Start Date End Date Taking? Authorizing Provider  acetaminophen (TYLENOL) 500 MG tablet Take 1,000 mg by  mouth every 6 (six) hours as needed for mild pain or fever.   Yes Historical Provider, MD  beclomethasone (QVAR) 80 MCG/ACT inhaler Inhale 2 puffs into the lungs 2 (two) times daily. 09/05/14  Yes Elsie Stain, MD  clonazePAM (KLONOPIN) 0.5 MG tablet Take 1 tablet (0.5 mg total) by mouth daily. 12/24/14  Yes Mariel Aloe, MD  cyclobenzaprine (FLEXERIL) 10 MG tablet Take 10 mg by mouth 3 (three) times daily as needed for muscle spasms.    Yes Historical Provider, MD  cycloSPORINE (RESTASIS) 0.05 % ophthalmic emulsion Place 1 drop into both eyes 2 (two) times daily. 01/29/15  Yes Nicolette Bang, DO  fluticasone (FLONASE) 50 MCG/ACT nasal spray Place 2 sprays into both nostrils daily as needed for allergies.    Yes Historical Provider, MD  gabapentin (NEURONTIN) 100 MG capsule Take 1 capsule (100 mg total) by mouth 3 (three) times daily. 06/29/14  Yes Tanda Rockers, MD  LANTUS SOLOSTAR 100 UNIT/ML Solostar Pen Inject 30 Units into the skin at bedtime. Patient taking differently: Inject 35 Units into the skin at bedtime.  01/29/15  Yes Nicolette Bang, DO  Linaclotide [facility]) 145 MCG CAPS capsule Take 1 capsule (145 mcg total) by mouth daily. 12/11/14  Yes Jessica D Zehr, PA-C  meclizine (ANTIVERT) 25 MG tablet Take 25 mg by mouth 3 (three) times daily as needed for dizziness (take 1/2 to 1 tablet by mouth as needed for dizziness).   Yes Historical Provider, MD  NOVOLOG FLEXPEN 100 UNIT/ML FlexPen Inject 4-8 Units into the skin 3 (three) times daily.   Yes Historical Provider, MD  ondansetron (ZOFRAN) 4 MG tablet Take 1 tablet (4 mg total) by mouth every 6 (six) hours as needed for nausea or vomiting. 01/15/15  Yes Gatha Mayer, MD  pantoprazole (PROTONIX) 40 MG tablet Take 1 tablet (40 mg total) by mouth daily at 6 (six) AM. 11/24/14  Yes Rhonda G Barrett, PA-C  potassium chloride SA (K-DUR,KLOR-CON) 20 MEQ tablet Take 1 tablet (20 mEq total) by mouth daily. 11/24/14  Yes Rhonda G  Barrett, PA-C  predniSONE (DELTASONE) 10 MG tablet Take 3 tablets (30 mg total) by mouth daily with breakfast. 01/29/15  Yes Nicolette Bang, DO  propranolol (INDERAL) 80 MG tablet Take 1 tablet (80 mg total) by mouth 2 (two) times daily. 01/29/15  Yes Nicolette Bang, DO  Olopatadine HCl (PATADAY) 0.2 % SOLN Place 1 drop into both eyes 2 (two) times daily.     Historical Provider, MD   BP 139/99 mmHg  Pulse 118  Temp(Src) 98.7 F (37.1 C) (Oral)  Resp 23  Ht 5\' 7"  (1.702 m)  Wt 139 lb (63.05 kg)  BMI 21.77 kg/m2  SpO2 94%  LMP 07/23/2014 Physical Exam  Constitutional: She is oriented to person, place, and time.  She appears well-developed and well-nourished.  HENT:  Head: Normocephalic and atraumatic.  Right Ear: External ear normal.  Left Ear: External ear normal.  Nose: Nose normal.  Mouth/Throat: Oropharynx is clear and moist.  Eyes: Conjunctivae and EOM are normal. Pupils are equal, round, and reactive to light.  Neck: Normal range of motion. Neck supple.  Cardiovascular: Normal rate, regular rhythm, normal heart sounds and intact distal pulses.   Pulmonary/Chest: Effort normal and breath sounds normal.  Abdominal: Soft. Bowel sounds are normal.  Musculoskeletal: Normal range of motion.  Neurological: She is alert and oriented to person, place, and time. She has normal reflexes.  Skin: Skin is warm and dry.  Psychiatric: She has a normal mood and affect. Her behavior is normal. Judgment and thought content normal.  Nursing note and vitals reviewed.   ED Course  Procedures (including critical care time) Labs Review Labs Reviewed  BASIC METABOLIC PANEL - Abnormal; Notable for the following:    Sodium 132 (*)    Potassium 3.4 (*)    Chloride 94 (*)    Glucose, Bld 264 (*)    BUN <5 (*)    All other components within normal limits  CBC - Abnormal; Notable for the following:    RBC 5.29 (*)    All other components within normal limits  I-STAT TROPOININ,  ED    Imaging Review Dg Chest 2 View  03/03/2015  CLINICAL DATA:  Abnormal portable chest x-Lillyth Spong; radiologist suggested PA and lat films; Pt c/o hearing "beeping" from her AICD this morning. Pt reported feeling mild palpitations when it was beeping. During transport, pt began having 5/10 chest.*comment was truncated* EXAM: CHEST  2 VIEW COMPARISON:  Earlier today and the CT of 01/21/2015. FINDINGS: 1349 hours. Single lead pacer/ AICD. No change in position. No lead discontinuity. Midline trachea. Moderate enlargement of the cardiopericardial silhouette again identified. A moderate left pleural effusion is new since 01/19/2015. No pneumothorax. No congestive failure. Left lower lobe and possibly lingular airspace disease. IMPRESSION: Moderate left pleural effusion. Adjacent left lower lobe atelectasis versus infection. Possible lingular airspace disease as well. New enlargement of the cardiopericardial silhouette, which could relate to cardiomegaly and/or pericardial effusion. Electronically Signed   By: Abigail Miyamoto M.D.   On: 03/03/2015 14:15   Dg Chest Port 1 View  03/03/2015  CLINICAL DATA:  Pt c/o hearing "beeping" from her AICD this morning. Pt reported feeling mild palpitations when it was beeping. During transport, pt began having 5/10 chest tightness radiating to right chest. Pt given 1 nitro with no relief; EXAM: PORTABLE CHEST 1 VIEW COMPARISON:  CT of 01/21/2015.  Plain film of 01/19/2015. FINDINGS: Similar appearance of pacer/ AICD device which terminates in the right ventricle. No lead discontinuity. Midline trachea. Interval enlargement of cardiopericardial silhouette. No pneumothorax. Small to moderate left pleural effusion. Clear right lung. Opacification of the inferior aspect of the left hemi thorax. IMPRESSION: No change in appearance of the AICD device. New enlargement of the cardiopericardial silhouette. Although this could be partially due to portable technique, cardiomegaly and/or  pericardial effusion are suspected. Opacification of the inferior left hemi thorax is most consistent with moderate pleural fluid and adjacent airspace disease. PA and lateral radiographs versus further evaluation with CT should be considered. Electronically Signed   By: Abigail Miyamoto M.D.   On: 03/03/2015 12:34   I have personally reviewed and evaluated these images and lab results as part of my medical decision-making.   EKG Interpretation   Date/Time:  Sunday March 03 2015 11:00:15 EDT Ventricular Rate:  124 PR Interval:  121 QRS Duration: 72 QT Interval:  358 QTC Calculation: 514 R Axis:   -44 Text Interpretation:  Sinus tachycardia Left axis deviation Borderline low  voltage, extremity leads Prolonged QT interval Confirmed by Demarques Pilz MD,  Andee Poles ([id number]) on 03/03/2015 11:37:05 AM      MDM   Final diagnoses:  Tachycardia  Aspiration pneumonia of left lower lobe, unspecified aspiration pneumonia type ([place])  Sarcoidosis ([place])  Pacemaker battery depletion    44 year old female who presents today complaining of the pain from her AICD. Medtronic interrogated and reveals that the battery voltage is 2.64. Replacement alert was triggered due to voltage earlier today of 2.62. It is recommended that she will have her battery replaced. Part of workup today chest x-Ajdin Macke was done. This reveals some pleural effusion and atelectasis of the left lingula which could represent infection. Patient denies cough or fever.  1- aicd - battery low- discussed with cardiology and they will contact her for battery replacement.  Patient aware 2- pneumonia/sarcoidosis- patient with known sarcoid.  She is tachycardiac and immunosuppressed.  She refuses iv antibiotics, fluids, solumedrol or further treatment or work up here.  She states that she feels well and does not think she needs this treatment.  She is advised that this could be life threatening.  A long converrsation ensued.  She refused further treatment  but understands she can return at any time.    The patient has decided to leave against medical advice.  The patient had a normal mental status examination and understands  her condition and the risks of leaving sepsis, worsening pneumonia  including  permanent disability and death. The patient has had an opportunity to ask  questions about his/her medical condition. The patient has been informed  that he/she may return for care at any time and has been referred to her  physician. The patient's family member was present for the conversation.   Pattricia Boss, MD 03/04/15 1314

## 2015-03-04 ENCOUNTER — Telehealth: Payer: Self-pay | Admitting: Internal Medicine

## 2015-03-04 NOTE — Telephone Encounter (Signed)
°  1. Has your device fired? Doesn't know  2. Is you device beeping? Pt states her device made a siren sound  3. Are you experiencing draining or swelling at device site? No  4. Are you calling to see if we received your device transmission? No  5. Have you passed out? No    Pt wants to know what to do and wants to talk to Dr. Tanna Furry nurse.

## 2015-03-04 NOTE — Telephone Encounter (Signed)
Pt reports that she heard an alert yesterday morning about 9:30am and presented to the ER yesterday evening. She had her device interrogated by industry-- I am awaiting confirmation from industry that alert was turned off. Per ER note- her ICD is RRT triggered 03-03-15. She has an appt scheduled with Dr. Lovena Le 03/14/15. Pt expresses relief. I will call her back if alert was not disabled in order to schedule a time for her to have it turned off in office. Pt agreeable.

## 2015-03-04 NOTE — Telephone Encounter (Signed)
Late entry.   Pt did not hear alarm today. Confirmed with industry that alarm should have been disabled after [facility] transmission in ED.

## 2015-03-08 LAB — AFB CULTURE WITH SMEAR (NOT AT ARMC): Acid Fast Smear: NONE SEEN

## 2015-03-11 ENCOUNTER — Ambulatory Visit (INDEPENDENT_AMBULATORY_CARE_PROVIDER_SITE_OTHER): Payer: Medicaid Other | Admitting: *Deleted

## 2015-03-11 ENCOUNTER — Ambulatory Visit: Payer: Medicaid Other | Admitting: Pulmonary Disease

## 2015-03-11 ENCOUNTER — Telehealth: Payer: Self-pay | Admitting: Internal Medicine

## 2015-03-11 DIAGNOSIS — Z95810 Presence of automatic (implantable) cardiac defibrillator: Secondary | ICD-10-CM | POA: Diagnosis not present

## 2015-03-11 LAB — CUP PACEART INCLINIC DEVICE CHECK
Battery Voltage: 2.64 V
HIGH POWER IMPEDANCE MEASURED VALUE: 44 Ohm
HIGH POWER IMPEDANCE MEASURED VALUE: 44 Ohm
HIGH POWER IMPEDANCE MEASURED VALUE: 45 Ohm
HIGH POWER IMPEDANCE MEASURED VALUE: 45 Ohm
HIGH POWER IMPEDANCE MEASURED VALUE: 45 Ohm
HIGH POWER IMPEDANCE MEASURED VALUE: 46 Ohm
HIGH POWER IMPEDANCE MEASURED VALUE: 46 Ohm
HIGH POWER IMPEDANCE MEASURED VALUE: 47 Ohm
HIGH POWER IMPEDANCE MEASURED VALUE: 47 Ohm
HIGH POWER IMPEDANCE MEASURED VALUE: 61 Ohm
HIGH POWER IMPEDANCE MEASURED VALUE: 63 Ohm
HIGH POWER IMPEDANCE MEASURED VALUE: 64 Ohm
HIGH POWER IMPEDANCE MEASURED VALUE: 65 Ohm
HIGH POWER IMPEDANCE MEASURED VALUE: 67 Ohm
HIGH POWER IMPEDANCE MEASURED VALUE: 69 Ohm
HighPow Impedance: 44 Ohm
HighPow Impedance: 45 Ohm
HighPow Impedance: 45 Ohm
HighPow Impedance: 46 Ohm
HighPow Impedance: 46 Ohm
HighPow Impedance: 47 Ohm
HighPow Impedance: 48 Ohm
HighPow Impedance: 60 Ohm
HighPow Impedance: 62 Ohm
HighPow Impedance: 62 Ohm
HighPow Impedance: 63 Ohm
HighPow Impedance: 64 Ohm
HighPow Impedance: 64 Ohm
HighPow Impedance: 64 Ohm
HighPow Impedance: 68 Ohm
HighPow Impedance: 70 Ohm
Implantable Lead Implant Date: 20080828
Implantable Lead Location: 753860
Lead Channel Impedance Value: 416 Ohm
Lead Channel Impedance Value: 424 Ohm
Lead Channel Impedance Value: 432 Ohm
Lead Channel Impedance Value: 432 Ohm
Lead Channel Impedance Value: 440 Ohm
Lead Channel Impedance Value: 448 Ohm
Lead Channel Impedance Value: 464 Ohm
Lead Channel Sensing Intrinsic Amplitude: 6 mV
Lead Channel Sensing Intrinsic Amplitude: 6.8 mV
Lead Channel Sensing Intrinsic Amplitude: 7.3 mV
Lead Channel Sensing Intrinsic Amplitude: 7.4 mV
Lead Channel Sensing Intrinsic Amplitude: 7.6 mV
Lead Channel Sensing Intrinsic Amplitude: 7.7 mV
Lead Channel Sensing Intrinsic Amplitude: 7.9 mV
Lead Channel Setting Pacing Pulse Width: 0.6 ms
Lead Channel Setting Sensing Sensitivity: 0.3 mV
MDC IDC MSMT LEADCHNL RV IMPEDANCE VALUE: 400 Ohm
MDC IDC MSMT LEADCHNL RV IMPEDANCE VALUE: 400 Ohm
MDC IDC MSMT LEADCHNL RV IMPEDANCE VALUE: 424 Ohm
MDC IDC MSMT LEADCHNL RV IMPEDANCE VALUE: 424 Ohm
MDC IDC MSMT LEADCHNL RV IMPEDANCE VALUE: 448 Ohm
MDC IDC MSMT LEADCHNL RV IMPEDANCE VALUE: 464 Ohm
MDC IDC MSMT LEADCHNL RV IMPEDANCE VALUE: 464 Ohm
MDC IDC MSMT LEADCHNL RV IMPEDANCE VALUE: 488 Ohm
MDC IDC MSMT LEADCHNL RV PACING THRESHOLD AMPLITUDE: 2.5 V
MDC IDC MSMT LEADCHNL RV PACING THRESHOLD PULSEWIDTH: 0.2 ms
MDC IDC MSMT LEADCHNL RV SENSING INTR AMPL: 6.2 mV
MDC IDC MSMT LEADCHNL RV SENSING INTR AMPL: 6.3 mV
MDC IDC MSMT LEADCHNL RV SENSING INTR AMPL: 6.9 mV
MDC IDC MSMT LEADCHNL RV SENSING INTR AMPL: 6.9 mV
MDC IDC MSMT LEADCHNL RV SENSING INTR AMPL: 6.9 mV
MDC IDC MSMT LEADCHNL RV SENSING INTR AMPL: 7 mV
MDC IDC MSMT LEADCHNL RV SENSING INTR AMPL: 7.3 mV
MDC IDC SESS DTM: 20161024155115
MDC IDC SET LEADCHNL RV PACING AMPLITUDE: 2.5 V
MDC IDC STAT BRADY RV PERCENT PACED: 0 %

## 2015-03-11 NOTE — Telephone Encounter (Signed)
Pt reports alert went off about 9:30am. She says that every time it goes off it makes her nervous. Appt made with device clinic for 3-3:30pm for device check.

## 2015-03-11 NOTE — Progress Notes (Signed)
ICD check in clinic to turn ERI alert off. ERI 03/03/15. Normal device function. Threshold and sensing consistent with previous device measurements. Impedance trends stable over time. No evidence of any ventricular arrhythmias. No changes made this session. Device programmed at appropriate safety margins. Device programmed to optimize intrinsic conduction. ROV with GT 03/14/15 to discuss generator replacement.

## 2015-03-11 NOTE — Telephone Encounter (Signed)
New message       1. Has your device fired? no 2. Is you device beeping? This am around 9:30 for a few seconds 3. Are you experiencing draining or swelling at device site? no  4. Are you calling to see if we received your device transmission? no 5. Have you passed out? No

## 2015-03-14 ENCOUNTER — Encounter: Payer: Self-pay | Admitting: Internal Medicine

## 2015-03-14 ENCOUNTER — Encounter: Payer: Self-pay | Admitting: *Deleted

## 2015-03-14 ENCOUNTER — Ambulatory Visit (INDEPENDENT_AMBULATORY_CARE_PROVIDER_SITE_OTHER): Payer: Medicaid Other | Admitting: Internal Medicine

## 2015-03-14 VITALS — BP 130/84 | HR 65 | Ht 66.0 in | Wt 141.6 lb

## 2015-03-14 DIAGNOSIS — Z01812 Encounter for preprocedural laboratory examination: Secondary | ICD-10-CM | POA: Diagnosis not present

## 2015-03-14 NOTE — Assessment & Plan Note (Signed)
She will continue her propranolol. Will follow. Avoid QT prolonging drugs.

## 2015-03-14 NOTE — Assessment & Plan Note (Signed)
Her blood pressure has been easier to control with her weight loss. No change in meds.

## 2015-03-14 NOTE — Assessment & Plan Note (Signed)
She has had no recurrent VT. Will continue her current meds. 

## 2015-03-14 NOTE — Patient Instructions (Signed)
Medication Instructions:  Your physician recommends that you continue on your current medications as directed. Please refer to the Current Medication list given to you today.  Labwork: Pre procedure lab work on 03/27/16. Please stop by the office that day, anytime between 8 am - 5 pm.  Testing/Procedures: Your physician has recommended that you have a defibrillator battery change. Please see the instruction sheet given to you today for more information.  Follow-Up: Your physician recommends that you schedule a wound check appointment, with device clinic in: 10-14 days post device battery change on 11/17.   Your physician recommends that you schedule a follow-up appointment in: 3 months with Dr. Lovena Le post device battery change on 11/17.   Any Other Special Instructions Will Be Listed Below (If Applicable).  If you need a refill on your cardiac medications before your next appointment, please call your pharmacy.  Thank you for choosing [facility]!!

## 2015-03-14 NOTE — Assessment & Plan Note (Signed)
She will undergo ICD generator change out in the next few weeks. We may revise pocket and place it under pectoralis major.

## 2015-03-14 NOTE — Progress Notes (Signed)
HPI [name] returns today for followup. She is a pleasant 44 year old woman with a history of long QT syndrome and a very strong family history of sudden cardiac death, status post ICD implantation. She also has a history of SVT and is status post catheter ablation. 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 She has dyspnea with exertion and has had an echo at [facility] demonstrationg normal LV function. She was at [facility] undergoing hysterectomy of a leiomioma. She has early satiety and some anorexia. She was hospitalized several weeks ago with a sarcoid flair and placed on steroids. She has reached ERI on her device.  Allergies  Allergen Reactions  . Contrast Media [Iodinated Diagnostic Agents] Anaphylaxis and Shortness Of Breath    Needed to be defibrillated   . Augmentin [Amoxicillin-Pot Clavulanate] Other (See Comments)    "stomach hurt"  . Codeine Nausea And Vomiting    jittery  . Iohexol      Code: RASH, Desc: PT STATES SHE IS ALLERGIC TO IV CONTRAST 05/28/06/RM, Onset Date: [id number]   . Meperidine Hcl Nausea And Vomiting  . Morphine And Related Nausea And Vomiting     Current Outpatient Prescriptions  Medication Sig Dispense Refill  . acetaminophen (TYLENOL) 500 MG tablet Take 1,000 mg by mouth every 6 (six) hours as needed for mild pain or fever.    Marland Kitchen azithromycin (ZITHROMAX) 250 MG tablet Take 1 tablet (250 mg total) by mouth daily. Take first 2 tablets together, then 1 every day until finished. 4 tablet 0  . beclomethasone (QVAR) 80 MCG/ACT inhaler Inhale 2 puffs into the lungs 2 (two) times daily. 1 Inhaler 5  . clonazePAM (KLONOPIN) 0.5 MG tablet Take 1 tablet (0.5 mg total) by mouth daily. 30 tablet 0  . cyclobenzaprine (FLEXERIL) 10 MG tablet Take 10 mg by mouth 3 (three) times daily as needed for muscle spasms.     . cycloSPORINE (RESTASIS) 0.05 % ophthalmic emulsion Place 1 drop into both eyes 2 (two) times daily. 0.4  mL 0  . fluticasone (FLONASE) 50 MCG/ACT nasal spray Place 2 sprays into both nostrils daily as needed for allergies.     Marland Kitchen gabapentin (NEURONTIN) 100 MG capsule Take 1 capsule (100 mg total) by mouth 3 (three) times daily. 90 capsule 2  . Insulin Glargine (LANTUS SOLOSTAR) 100 UNIT/ML Solostar Pen Inject 35 Units into the skin at bedtime.    . Linaclotide (LINZESS) 145 MCG CAPS capsule Take 1 capsule (145 mcg total) by mouth daily. 30 capsule 3  . meclizine (ANTIVERT) 25 MG tablet Take 12.5 mg by mouth 3 (three) times daily as needed for dizziness (take 1/2 to 1 tablet by mouth as needed for dizziness).     . NOVOLOG FLEXPEN 100 UNIT/ML FlexPen Inject 4-8 Units into the skin 3 (three) times daily.  0  . Olopatadine HCl (PATADAY) 0.2 % SOLN Place 1 drop into both eyes 2 (two) times daily.     . ondansetron (ZOFRAN) 4 MG tablet Take 1 tablet (4 mg total) by mouth every 6 (six) hours as needed for nausea or vomiting. 60 tablet 0  . pantoprazole (PROTONIX) 40 MG tablet Take 1 tablet (40 mg total) by mouth daily at 6 (six) AM. 30 tablet 2  . potassium chloride SA (K-DUR,KLOR-CON) 20 MEQ tablet Take 1 tablet (20 mEq total) by mouth daily. 30 tablet 2  . predniSONE (DELTASONE) 10 MG tablet Take 3 tablets (30 mg total) by  mouth daily with breakfast. 90 tablet 1  . propranolol (INDERAL) 80 MG tablet Take 1 tablet (80 mg total) by mouth 2 (two) times daily. 30 tablet 1   No current facility-administered medications for this visit.     Past Medical History  Diagnosis Date  . Long Q-T syndrome   . Palpitation   . Seizure disorder ([place])     none recently  . Abdominal pain, periumbilic   . Esophageal stricture 2005/2009    esophageal strictures dilated 2005, 2009  . Sarcoidosis ([place])   . Allergic rhinitis   . Bronchitis   . GERD (gastroesophageal reflux disease) 2005  . Asthma   . Fibromyalgia   . Stroke ([place])     QUESTIONABLE TIA   . Neuropathy in diabetes ([place])   . Seizures ([place])      HISTORY OF TAKING KLONIPIN NOT RX AT THE MOMENT  . Cancer ([place])     Nodules in lungs pt believes were cancerous pt unsure  . Arthritis   . Internal hemorrhoids 2010    2011 band ligation of int rrhoids.  . Eye hemorrhage     bilateral  . Fibroid, uterine   . Automatic implantable cardioverter-defibrillator in situ 2006, replaced 2008    MEDTRONIC  . DM (diabetes mellitus) ([place]) 1997/1998    type 1, initially gestational but soon after child birth developed IDDM  . History of blood transfusion years ago    ROS:   All systems reviewed and negative except as noted in the HPI.   Past Surgical History  Procedure Laterality Date  . Tubal ligation    . Hand surgery    . Svt ablation    . Automatic implantable cardiac defibrillator situ  2006,     ICD-Medtronic   Remote - Yes   . Hemorrhoid banding  03/2010  . Dilitation & currettage/hystroscopy with hydrothermal ablation N/A 06/07/2013    Procedure: DILATATION & CURETTAGE/HYSTEROSCOPY WITH attempted HYDROTHERMAL ABLATION;  Surgeon: Frederico Hamman, MD;  Location: [facility];  Service: Gynecology;  Laterality: N/A;  . Esophagogastroduodenoscopy (egd) with propofol N/A 10/24/2014    Procedure: ESOPHAGOGASTRODUODENOSCOPY (EGD) WITH PROPOFOL;  Surgeon: Inda Castle, MD;  [facility];  Service: Endoscopy;  Laterality: N/A;  . Total abdominal hysterectomy w/ bilateral salpingoophorectomy  11/06/14    at [facility]. for menorrhagia, pelvic pain and enlarging uterine fibroids  . Exploratory laparotomy  11/06/14    evacuation of post TAH/BSO hematoma  . Abdominal hysterectomy    . Salpingectomy    . Cervix removal    . Video bronchoscopy Bilateral 01/24/2015    Procedure: VIDEO BRONCHOSCOPY WITHOUT FLUORO;  Surgeon: Marshell Garfinkel, MD;  Location: [facility];  Service: Cardiopulmonary;  Laterality: Bilateral;     Family History  Problem Relation Age of Onset  . Lung cancer Maternal Uncle     x2  . Breast cancer Maternal Aunt     x2   . Stomach cancer Paternal Uncle   . Stomach cancer Paternal Grandfather   . Stomach cancer Paternal Grandmother      Social History   Social History  . Marital Status: Single    Spouse Name: N/A  . Number of Children: N/A  . Years of Education: N/A   Occupational History  . Not on file.   Social History Main Topics  . Smoking status: Never Smoker   . Smokeless tobacco: Never Used  . Alcohol Use: 0.0 oz/week    0 Standard drinks or equivalent per week  Comment: occ.  . Drug Use: No  . Sexual Activity: No   Other Topics Concern  . Not on file   Social History Narrative     BP 130/84 mmHg  Pulse 65  Ht 5\' 6"  (1.676 m)  Wt 141 lb 9.6 oz (64.229 kg)  BMI 22.87 kg/m2  LMP 07/23/2014  Physical Exam:  Chronically ill appearing 44 year old woman, NAD HEENT: Unremarkable Neck:  6 cm JVD, no thyromegally Back:  No CVA tenderness Lungs:  Clear with no wheezes, rales, or rhonchi. HEART:  Regular rate rhythm, no murmurs, no rubs, no clicks Abd:  soft, positive bowel sounds, no organomegally, no rebound, no guarding Ext:  2 plus pulses, no edema, no cyanosis, no clubbing Skin:  No rashes no nodules Neuro:  CN II through XII intact, motor grossly intact  DEVICE  Normal device function.  See PaceArt for details. Reached ERI.  Assess/Plan:

## 2015-03-15 ENCOUNTER — Other Ambulatory Visit: Payer: Self-pay | Admitting: *Deleted

## 2015-03-15 DIAGNOSIS — I4721 Torsades de pointes: Secondary | ICD-10-CM

## 2015-03-15 DIAGNOSIS — I472 Ventricular tachycardia: Secondary | ICD-10-CM

## 2015-03-15 DIAGNOSIS — Z4502 Encounter for adjustment and management of automatic implantable cardiac defibrillator: Secondary | ICD-10-CM

## 2015-03-18 ENCOUNTER — Encounter: Payer: Self-pay | Admitting: Pulmonary Disease

## 2015-03-18 ENCOUNTER — Ambulatory Visit (INDEPENDENT_AMBULATORY_CARE_PROVIDER_SITE_OTHER)
Admission: RE | Admit: 2015-03-18 | Discharge: 2015-03-18 | Disposition: A | Discharge status: Home / Self Care | Payer: Medicaid Other | Source: Ambulatory Visit | Attending: Pulmonary Disease | Admitting: Pulmonary Disease

## 2015-03-18 ENCOUNTER — Ambulatory Visit (INDEPENDENT_AMBULATORY_CARE_PROVIDER_SITE_OTHER): Payer: Medicaid Other | Admitting: Pulmonary Disease

## 2015-03-18 ENCOUNTER — Telehealth: Payer: Self-pay | Admitting: Pulmonary Disease

## 2015-03-18 VITALS — BP 126/72 | HR 92 | Ht 66.0 in | Wt 146.9 lb

## 2015-03-18 DIAGNOSIS — D860 Sarcoidosis of lung: Secondary | ICD-10-CM

## 2015-03-18 DIAGNOSIS — D869 Sarcoidosis, unspecified: Secondary | ICD-10-CM | POA: Diagnosis not present

## 2015-03-18 MED ORDER — MOXIFLOXACIN HCL 400 MG PO TABS: 400.0000 mg | ORAL_TABLET | Freq: Every day | ORAL | Status: DC

## 2015-03-18 NOTE — Telephone Encounter (Signed)
Spoke with pt. States that Avelox is not covered by her insurance. She will need an alternative.  Dr. Vaughan Browner please advise. Thanks.

## 2015-03-18 NOTE — Patient Instructions (Signed)
Will order moxifloxacin for 7 days. We'll schedule you for a chest x-ray.  Return to clinic in 1-2 months.

## 2015-03-18 NOTE — Progress Notes (Signed)
Subjective:    Patient ID: [name], female    DOB: 29-May-1970, 44 y.o.   MRN: [id number]  HPI Follow-up for evaluation of sarcoidosis.  [name] is 44 year old with stage III sarcoidosis, asthmatic bronchitis, chronic cough. She is a former patient of Dr. Joya Gaskins. She was diagnosed around 2003. Is not clear if she had a tissue diagnosis. There is record of a transbronchial biopsy in 2003 but no granulomas were seen. She had been on and off systemic prednisone. She was admitted in September with sarcoidosis flare. Bronchial with BAL showed 10K staph aureus which was thought to be a contaminant. She was given Solu-Medrol 60 q6 and then discharged on a slow prednisone taper starting at 60 mg. CT chest showed sarcoid changes.  CT abd showed some heptospleenomegaly ACE level was very high at 177.  Echo showed EF 60-65%, PAP 36.   After discharge he reports feeling better with improves in malaise and fevers. She has better appetite and is gaining weight. She was seen in the emergency room on 10/16 because she heard people's from the pacemaker. A chest x-ray at that time showed new left lower lobe atelectasis and moderate pleural effusion. She was given a course of Zithromax. She denies any fevers chills, sputum sputum production she has chronic cough which has worsened since the past week.  She is also being followed by cardiology for prolonged QT syndrome, family history of sudden death. Her ICD is scheduled to be changed in about 2 weeks.  DATA: PFTs (08/29/14) FVC 2.98 (85%) FEV1 2.42 (85%) F/F 81  CT scan (01/21/15) 1. There is a spectrum of findings in the thorax that is most likely related to the patient's history of sarcoidosis. Specifically, there is a perilymphatic distribution of multiple tiny subcentimeter pulmonary nodules, in addition to mediastinal lymphadenopathy. 2. Dilatation of the pulmonic trunk (3.6 cm in diameter), suggestive of pulmonary arterial  hypertension. 3. Splenomegaly. 4. Probable hepatomegaly.  CXR (03/03/15) Moderate left pleural effusion. Adjacent left lower lobe atelectasis versus infection. Possible lingular airspace disease as well. New enlargement of the cardiopericardial silhouette, which could relate to cardiomegaly and/or pericardial effusion.   Past Medical History  Diagnosis Date  . Long Q-T syndrome   . Palpitation   . Seizure disorder ([place])     none recently  . Abdominal pain, periumbilic   . Esophageal stricture 2005/2009    esophageal strictures dilated 2005, 2009  . Sarcoidosis ([place])   . Allergic rhinitis   . Bronchitis   . GERD (gastroesophageal reflux disease) 2005  . Asthma   . Fibromyalgia   . Stroke ([place])     QUESTIONABLE TIA   . Neuropathy in diabetes ([place])   . Seizures ([place])     HISTORY OF TAKING KLONIPIN NOT RX AT THE MOMENT  . Cancer ([place])     Nodules in lungs pt believes were cancerous pt unsure  . Arthritis   . Internal hemorrhoids 2010    2011 band ligation of int rrhoids.  . Eye hemorrhage     bilateral  . Fibroid, uterine   . Automatic implantable cardioverter-defibrillator in situ 2006, replaced 2008    MEDTRONIC  . DM (diabetes mellitus) ([place]) 1997/1998    type 1, initially gestational but soon after child birth developed IDDM  . History of blood transfusion years ago    Current outpatient prescriptions:  .  acetaminophen (TYLENOL) 500 MG tablet, Take 1,000 mg by mouth every 6 (six) hours as needed for mild pain  or fever., Disp: , Rfl:  .  beclomethasone (QVAR) 80 MCG/ACT inhaler, Inhale 2 puffs into the lungs 2 (two) times daily., Disp: 1 Inhaler, Rfl: 5 .  clonazePAM (KLONOPIN) 0.5 MG tablet, Take 1 tablet (0.5 mg total) by mouth daily., Disp: 30 tablet, Rfl: 0 .  cyclobenzaprine (FLEXERIL) 10 MG tablet, Take 10 mg by mouth 3 (three) times daily as needed for muscle spasms. , Disp: , Rfl:  .  cycloSPORINE (RESTASIS) 0.05 % ophthalmic emulsion, Place 1 drop into  both eyes 2 (two) times daily., Disp: 0.4 mL, Rfl: 0 .  fluticasone (FLONASE) 50 MCG/ACT nasal spray, Place 2 sprays into both nostrils daily as needed for allergies. , Disp: , Rfl:  .  gabapentin (NEURONTIN) 100 MG capsule, Take 1 capsule (100 mg total) by mouth 3 (three) times daily., Disp: 90 capsule, Rfl: 2 .  Insulin Glargine (LANTUS SOLOSTAR) 100 UNIT/ML Solostar Pen, Inject 35 Units into the skin at bedtime., Disp: , Rfl:  .  Linaclotide (LINZESS) 145 MCG CAPS capsule, Take 1 capsule (145 mcg total) by mouth daily., Disp: 30 capsule, Rfl: 3 .  meclizine (ANTIVERT) 25 MG tablet, Take 12.5 mg by mouth 3 (three) times daily as needed for dizziness (take 1/2 to 1 tablet by mouth as needed for dizziness). , Disp: , Rfl:  .  NOVOLOG FLEXPEN 100 UNIT/ML FlexPen, Inject 4-8 Units into the skin 3 (three) times daily., Disp: , Rfl: 0 .  Olopatadine HCl (PATADAY) 0.2 % SOLN, Place 1 drop into both eyes 2 (two) times daily. , Disp: , Rfl:  .  ondansetron (ZOFRAN) 4 MG tablet, Take 1 tablet (4 mg total) by mouth every 6 (six) hours as needed for nausea or vomiting., Disp: 60 tablet, Rfl: 0 .  pantoprazole (PROTONIX) 40 MG tablet, Take 1 tablet (40 mg total) by mouth daily at 6 (six) AM., Disp: 30 tablet, Rfl: 2 .  potassium chloride SA (K-DUR,KLOR-CON) 20 MEQ tablet, Take 1 tablet (20 mEq total) by mouth daily., Disp: 30 tablet, Rfl: 2 .  predniSONE (DELTASONE) 10 MG tablet, Take 3 tablets (30 mg total) by mouth daily with breakfast. (Patient taking differently: Take 20 mg by mouth daily with breakfast. ), Disp: 90 tablet, Rfl: 1 .  propranolol (INDERAL) 80 MG tablet, Take 1 tablet (80 mg total) by mouth 2 (two) times daily., Disp: 30 tablet, Rfl: 1 .  moxifloxacin (AVELOX) 400 MG tablet, Take 1 tablet (400 mg total) by mouth daily., Disp: 7 tablet, Rfl: 0   Review of Systems Chronic cough, nonproductive area denies any sputum production, fevers, chills, hemoptysis. Denies any chest pain,  palpitations. Denies any nausea, vomiting, diarrhea, constipation. Denies any malaise, loss of weight or loss of appetite. All other review of systems are negative.    Objective:   Physical Exam Gen.: Frail appearing female. No apparent distress Neuro: No gross focal deficits. Neck: No JVD, lymphadenopathy, thyromegaly. RS: Reduced breath sounds over the left lower chest. Dullness to percussion. No crackles or wheezes heard. CVS: S1-S2 heard, no murmurs rubs gallops. Abdomen: Soft, positive bowel sounds. Extremities: No edema.    Assessment & Plan:  #1 Sarcoidosis, stage III She is slowly improving with the steroids. I will continue her prednisone at 20 mg for now and reevaluate in a month or 2 before tapering further. She likely need a very prolonged taper.  #2 Asthmatic bronchitis. She is doing well on the Qvar inhaler. I'll continue the same.  #3 Cough She does have chronic nonproductive  cough but her symptoms appear to worsen over the past week. She has a recent chest x-ray which shows a new left effusion with atelectasis. I'm not sure if this is an infection or related to her ongoing sarcoidosis flare. She has already received a course of Zithromax but on examination there appears to be persistent fluid in her left chest. She does not have any, leukocytosis, fevers or chills but as she is immunosuppressed on prednisone these signs are unreliable. I'll put her on moxifloxacin for 7 days and repeat a chest x-ray today. If the effusion is persistent then she will need a thoracentesis for further evaluation.  This question of cardiac enlargement and ? pericardial effusion on the chest x-ray. If this is redemonstrated on the repeat then I will order an echocardiogram.  She is already on Flonase nasal spray for rhinitis and Protonix for GERD.  Plan: - Continue prednisone at 20 mg daily for now. - Moxifloxacin 400 mg for 7 days - Chest x-ray  Marshell Garfinkel MD [facility] Pager [phone] If no answer or after 3pm call: [phone] 03/18/2015, 3:01 PM

## 2015-03-19 ENCOUNTER — Telehealth: Payer: Self-pay | Admitting: *Deleted

## 2015-03-19 ENCOUNTER — Encounter: Payer: Self-pay | Admitting: Internal Medicine

## 2015-03-19 DIAGNOSIS — J90 Pleural effusion, not elsewhere classified: Secondary | ICD-10-CM

## 2015-03-19 NOTE — Telephone Encounter (Signed)
Per PM: okay for Augmentin 875mg  BID x7 days.  Thanks.  *triage, there is another message on pt regarding cxr results/recs.  If you speak with patient, please inform her or have her speak with me.  Thanks!

## 2015-03-19 NOTE — Telephone Encounter (Signed)
lmtcb

## 2015-03-19 NOTE — Telephone Encounter (Signed)
Per PM: left pleural effusion on 10/31 cxr.  Please schedule thoracentesis for next week (PM will perform; any day but Monday).  Pt will needs labs done 1 day prior > CBCD, PT/INR, protein, albumin) and labs done on the fluid > cell count w/ diff, LDH, protein, cultures, AFB culture and smear, Fungla culture and smear, cytology, albumin and adenosine deaminase.  LMOM TCB x1 for pt. Inocencio Homes not yet scheduled because I have not spoken to patient Will forward to my inbox

## 2015-03-19 NOTE — Telephone Encounter (Signed)
Augmentin for 7 days

## 2015-03-20 ENCOUNTER — Encounter: Payer: Self-pay | Admitting: Internal Medicine

## 2015-03-20 NOTE — Telephone Encounter (Signed)
Patient coming in on 11/10 to have blood work done, she is having defibrillator put in on 11/17. Do you still want to schedule the Thoracentesis since she is having the defibrillator surgery?

## 2015-03-20 NOTE — Telephone Encounter (Signed)
LMTCB

## 2015-03-20 NOTE — Telephone Encounter (Signed)
Patient returned call, may be reached at [phone].

## 2015-03-20 NOTE — Telephone Encounter (Signed)
lmtcb

## 2015-03-21 NOTE — Telephone Encounter (Signed)
Discussed further with PM - pt is scheduled for CBCD and BMET on 11.10.16 to go with her defib placement on the 17th.  Can we add the PT/INR, protein and albumin to these and try to schedule the thoracentesis for Friday 11/11 at [facility]?  Thanks.  LMOM TCB x1 Will leave in my inbox and route to triage as well to help ensure follow up.

## 2015-03-21 NOTE — Telephone Encounter (Signed)
Patient returned call, may be reached at [phone]

## 2015-03-21 NOTE — Telephone Encounter (Signed)
lmomtcb x1 for pt 

## 2015-03-21 NOTE — Telephone Encounter (Signed)
Dr Vaughan Browner please advise, thank you.

## 2015-03-21 NOTE — Telephone Encounter (Signed)
Called and spoke with pt she said that she is allergy to augmentin. She states it gives her stomach pains and would like to have a different rx. PM please advise

## 2015-03-21 NOTE — Telephone Encounter (Signed)
Doxy 100 bid for 7 days.

## 2015-03-21 NOTE — Telephone Encounter (Signed)
Yes. Please go ahead and schedule the thora.

## 2015-03-22 MED ORDER — DOXYCYCLINE HYCLATE 100 MG PO TABS: 100.0000 mg | ORAL_TABLET | Freq: Two times a day (BID) | ORAL | Status: DC

## 2015-03-22 NOTE — Telephone Encounter (Signed)
Called spoke with pt. She is fine with having the thora done. She is aware will need labs done.  Called over to resp thora scheduled for 11/11 at 11AM [facility], JJ will inform Dr. Vaughan Browner. Lab orders placed for pt to have done 1 day prior (on 11/10) Short stay will call pt w/ time pt needs to arrive, ETC. Called pt to make aware and LMTCB x1

## 2015-03-22 NOTE — Telephone Encounter (Signed)
Patient notified of date and time for Thora and notified of lab orders.   Patient also notified that Short stay will contact her with prep instructions. Per Rhona Raider with inform Dr. Vaughan Browner of procedure date and time.  To Jess for follow up

## 2015-03-22 NOTE — Telephone Encounter (Signed)
[name], patient returning call, do the labs need to be ordered and the Thora scheduled?  Please advise so I can call patient to get this done.   Thanks.

## 2015-03-22 NOTE — Telephone Encounter (Signed)
Spoke with pt. Aware of recs. Doxy sent in. Nothing further needed

## 2015-03-22 NOTE — Telephone Encounter (Signed)
Patient returning call, may be reached at [phone]

## 2015-03-27 NOTE — Telephone Encounter (Signed)
Spoke with Jess. Dr. Vaughan Browner is aware of time and date of thoracentesis. Pt aware. Ok to sign off.

## 2015-03-28 ENCOUNTER — Other Ambulatory Visit (INDEPENDENT_AMBULATORY_CARE_PROVIDER_SITE_OTHER): Payer: Medicaid Other | Admitting: *Deleted

## 2015-03-28 DIAGNOSIS — E119 Type 2 diabetes mellitus without complications: Secondary | ICD-10-CM | POA: Diagnosis not present

## 2015-03-28 DIAGNOSIS — Z01812 Encounter for preprocedural laboratory examination: Secondary | ICD-10-CM | POA: Diagnosis not present

## 2015-03-28 LAB — CBC WITH DIFFERENTIAL/PLATELET
BASOS ABS: 0 10*3/uL (ref 0.0–0.1)
BASOS PCT: 0 % (ref 0–1)
EOS ABS: 0.1 10*3/uL (ref 0.0–0.7)
Eosinophils Relative: 1 % (ref 0–5)
HCT: 40.4 % (ref 36.0–46.0)
Hemoglobin: 13.6 g/dL (ref 12.0–15.0)
Lymphocytes Relative: 27 % (ref 12–46)
Lymphs Abs: 1.8 10*3/uL (ref 0.7–4.0)
MCH: 28.3 pg (ref 26.0–34.0)
MCHC: 33.7 g/dL (ref 30.0–36.0)
MCV: 84.2 fL (ref 78.0–100.0)
MPV: 10.9 fL (ref 8.6–12.4)
Monocytes Absolute: 0.7 10*3/uL (ref 0.1–1.0)
Monocytes Relative: 11 % (ref 3–12)
Neutro Abs: 4.1 10*3/uL (ref 1.7–7.7)
Neutrophils Relative %: 61 % (ref 43–77)
PLATELETS: 416 10*3/uL — AB (ref 150–400)
RBC: 4.8 MIL/uL (ref 3.87–5.11)
RDW: 14.4 % (ref 11.5–15.5)
WBC: 6.7 10*3/uL (ref 4.0–10.5)

## 2015-03-28 LAB — BASIC METABOLIC PANEL
BUN: 8 mg/dL (ref 7–25)
CALCIUM: 9.9 mg/dL (ref 8.6–10.2)
CO2: 29 mmol/L (ref 20–31)
CREATININE: 0.41 mg/dL — AB (ref 0.50–1.10)
Chloride: 96 mmol/L — ABNORMAL LOW (ref 98–110)
GLUCOSE: 202 mg/dL — AB (ref 65–99)
POTASSIUM: 3.5 mmol/L (ref 3.5–5.3)
Sodium: 135 mmol/L (ref 135–146)

## 2015-03-28 NOTE — Addendum Note (Signed)
Addended by: Eulis Foster on: 03/28/2015 08:46 AM   Modules accepted: Orders

## 2015-03-28 NOTE — Addendum Note (Signed)
Addended by: Eulis Foster on: 03/28/2015 08:47 AM   Modules accepted: Orders

## 2015-03-29 ENCOUNTER — Ambulatory Visit (HOSPITAL_COMMUNITY)
Admission: RE | Admit: 2015-03-29 | Discharge: 2015-03-29 | Disposition: A | Discharge status: Home / Self Care | Payer: Medicaid Other | Source: Ambulatory Visit | Attending: Pulmonary Disease | Admitting: Pulmonary Disease

## 2015-03-29 DIAGNOSIS — D869 Sarcoidosis, unspecified: Secondary | ICD-10-CM | POA: Diagnosis not present

## 2015-03-29 DIAGNOSIS — G40909 Epilepsy, unspecified, not intractable, without status epilepticus: Secondary | ICD-10-CM | POA: Insufficient documentation

## 2015-03-29 DIAGNOSIS — K219 Gastro-esophageal reflux disease without esophagitis: Secondary | ICD-10-CM | POA: Diagnosis not present

## 2015-03-29 DIAGNOSIS — J45909 Unspecified asthma, uncomplicated: Secondary | ICD-10-CM | POA: Diagnosis not present

## 2015-03-29 DIAGNOSIS — Z794 Long term (current) use of insulin: Secondary | ICD-10-CM | POA: Diagnosis not present

## 2015-03-29 DIAGNOSIS — E1040 Type 1 diabetes mellitus with diabetic neuropathy, unspecified: Secondary | ICD-10-CM | POA: Insufficient documentation

## 2015-03-29 DIAGNOSIS — Z95810 Presence of automatic (implantable) cardiac defibrillator: Secondary | ICD-10-CM | POA: Insufficient documentation

## 2015-03-29 DIAGNOSIS — J90 Pleural effusion, not elsewhere classified: Secondary | ICD-10-CM | POA: Diagnosis present

## 2015-03-29 DIAGNOSIS — Z4502 Encounter for adjustment and management of automatic implantable cardiac defibrillator: Secondary | ICD-10-CM | POA: Diagnosis present

## 2015-03-29 DIAGNOSIS — I472 Ventricular tachycardia: Secondary | ICD-10-CM | POA: Diagnosis not present

## 2015-03-29 DIAGNOSIS — Z91041 Radiographic dye allergy status: Secondary | ICD-10-CM | POA: Diagnosis not present

## 2015-03-29 DIAGNOSIS — M797 Fibromyalgia: Secondary | ICD-10-CM | POA: Insufficient documentation

## 2015-03-29 DIAGNOSIS — Z880 Allergy status to penicillin: Secondary | ICD-10-CM | POA: Diagnosis not present

## 2015-03-29 DIAGNOSIS — Z9889 Other specified postprocedural states: Secondary | ICD-10-CM

## 2015-03-29 LAB — BODY FLUID CELL COUNT WITH DIFFERENTIAL
Lymphs, Fluid: 92 %
Monocyte-Macrophage-Serous Fluid: 3 % — ABNORMAL LOW (ref 50–90)
Neutrophil Count, Fluid: 5 % (ref 0–25)
Total Nucleated Cell Count, Fluid: 5413 cu mm — ABNORMAL HIGH (ref 0–1000)

## 2015-03-29 LAB — ALBUMIN, FLUID (OTHER): Albumin, Fluid: 2.8 g/dL

## 2015-03-29 LAB — GRAM STAIN

## 2015-03-29 LAB — LACTATE DEHYDROGENASE, PLEURAL OR PERITONEAL FLUID: LD, Fluid: 140 U/L — ABNORMAL HIGH (ref 3–23)

## 2015-03-29 LAB — PROTEIN, TOTAL: Total Protein: 7.4 g/dL (ref 6.5–8.1)

## 2015-03-29 LAB — ALBUMIN: Albumin: 2.9 g/dL — ABNORMAL LOW (ref 3.5–5.0)

## 2015-03-29 LAB — PROTEIN, BODY FLUID: TOTAL PROTEIN, FLUID: 6.2 g/dL

## 2015-03-29 LAB — LACTATE DEHYDROGENASE: LDH: 210 U/L — ABNORMAL HIGH (ref 98–192)

## 2015-03-29 LAB — CHOLESTEROL, TOTAL: Cholesterol: 166 mg/dL (ref 0–200)

## 2015-03-29 NOTE — Procedures (Signed)
Thoracentesis Procedure Note  Pre-operative Diagnosis: Left pleural efusion  Post-operative Diagnosis: same  Indications: H/O sarcoidosis with left effusion.  Procedure Details  Consent: Informed consent was obtained. Risks of the procedure were discussed including: infection, bleeding, pain, pneumothorax.  Under sterile conditions the patient was positioned. Betadine solution and sterile drapes were utilized.  1% plain lidocaine was used to anesthetize the rib space. Fluid was obtained without any difficulties and minimal blood loss.  A dressing was applied to the wound and wound care instructions were provided.   Findings 1500 ml of bloody pleural fluid was obtained. A sample was sent to Pathology for cytogenetics, flow, and cell counts, as well as for infection analysis.  Complications:  None; patient tolerated the procedure well. She started coughing a lot towards the end and the procedure was terminated before all the fluid was drained.         Condition: stable  Plan A follow up chest x-ray was ordered. Bed Rest for 2 hours. Tylenol 650 mg. for pain.  Marshell Garfinkel MD [facility] Pager [phone] If no answer or after 3pm call: [phone] 03/29/2015, 12:04 PM

## 2015-03-29 NOTE — H&P (Signed)
Patient ID: [name], female DOB: 1971-03-26, 44 y.o. MRN: [id number]  HPI Presents for left thoracentesis.  [name] is 44 year old with stage III sarcoidosis, asthmatic bronchitis, chronic cough. She is a former patient of Dr. Joya Gaskins. She was diagnosed around 2003. Is not clear if she had a tissue diagnosis. There is record of a transbronchial biopsy in 2003 but no granulomas were seen. She had been on and off systemic prednisone. She was admitted in September with sarcoidosis flare. Bronchial with BAL showed 10K staph aureus which was thought to be a contaminant. She was given Solu-Medrol 60 q6 and then discharged on a slow prednisone taper starting at 60 mg. CT chest showed sarcoid changes.  CT abd showed some heptospleenomegaly ACE level was very high at 177.  Echo showed EF 60-65%, PAP 36.   After discharge he reports feeling better with improves in malaise and fevers. She has better appetite and is gaining weight. She was seen in the emergency room on 10/16 because she heard people's from the pacemaker. A chest x-ray at that time showed new left lower lobe atelectasis and moderate pleural effusion. She was given a course of Zithromax. She denies any fevers chills, sputum sputum production she has chronic cough which has worsened since the past week.  She is also being followed by cardiology for prolonged QT syndrome, family history of sudden death. Her ICD is scheduled to be changed in about 2 weeks.  DATA: PFTs (08/29/14) FVC 2.98 (85%) FEV1 2.42 (85%) F/F 81  CT scan (01/21/15) 1. There is a spectrum of findings in the thorax that is most likely related to the patient's history of sarcoidosis. Specifically, there is a perilymphatic distribution of multiple tiny subcentimeter pulmonary nodules, in addition to mediastinal lymphadenopathy. 2. Dilatation of the pulmonic trunk (3.6 cm in diameter), suggestive of pulmonary arterial hypertension. 3. Splenomegaly. 4.  Probable hepatomegaly.  CXR (03/03/15) Moderate left pleural effusion. Adjacent left lower lobe atelectasis versus infection. Possible lingular airspace disease as well. New enlargement of the cardiopericardial silhouette, which could relate to cardiomegaly and/or pericardial effusion.   Past Medical History Diagnosis Date . Long Q-T syndrome  . Palpitation  . Seizure disorder ([place])    none recently . Abdominal pain, periumbilic  . Esophageal stricture 2005/2009   esophageal strictures dilated 2005, 2009 . Sarcoidosis ([place])  . Allergic rhinitis  . Bronchitis  . GERD (gastroesophageal reflux disease) 2005 . Asthma  . Fibromyalgia  . Stroke ([place])    QUESTIONABLE TIA  . Neuropathy in diabetes ([place])  . Seizures ([place])    HISTORY OF TAKING KLONIPIN NOT RX AT THE MOMENT . Cancer ([place])    Nodules in lungs pt believes were cancerous pt unsure . Arthritis  . Internal hemorrhoids 2010   2011 band ligation of int rrhoids. . Eye hemorrhage    bilateral . Fibroid, uterine  . Automatic implantable cardioverter-defibrillator in situ 2006, replaced 2008   MEDTRONIC . DM (diabetes mellitus) ([place]) 1997/1998   type 1, initially gestational but soon after child birth developed IDDM . History of blood transfusion years ago   Current outpatient prescriptions:  . acetaminophen (TYLENOL) 500 MG tablet, Take 1,000 mg by mouth every 6 (six) hours as needed for mild pain or fever., Disp: , Rfl:  . beclomethasone (QVAR) 80 MCG/ACT inhaler, Inhale 2 puffs into the lungs 2 (two) times daily., Disp: 1 Inhaler, Rfl: 5 . clonazePAM (KLONOPIN) 0.5 MG tablet, Take 1 tablet (0.5 mg total) by mouth daily., Disp: 30 tablet,  Rfl: 0 . cyclobenzaprine (FLEXERIL) 10 MG tablet, Take 10 mg by mouth 3 (three) times daily as needed for muscle spasms. , Disp: , Rfl:  . cycloSPORINE (RESTASIS) 0.05 %  ophthalmic emulsion, Place 1 drop into both eyes 2 (two) times daily., Disp: 0.4 mL, Rfl: 0 . fluticasone (FLONASE) 50 MCG/ACT nasal spray, Place 2 sprays into both nostrils daily as needed for allergies. , Disp: , Rfl:  . gabapentin (NEURONTIN) 100 MG capsule, Take 1 capsule (100 mg total) by mouth 3 (three) times daily., Disp: 90 capsule, Rfl: 2 . Insulin Glargine (LANTUS SOLOSTAR) 100 UNIT/ML Solostar Pen, Inject 35 Units into the skin at bedtime., Disp: , Rfl:  . Linaclotide (LINZESS) 145 MCG CAPS capsule, Take 1 capsule (145 mcg total) by mouth daily., Disp: 30 capsule, Rfl: 3 . meclizine (ANTIVERT) 25 MG tablet, Take 12.5 mg by mouth 3 (three) times daily as needed for dizziness (take 1/2 to 1 tablet by mouth as needed for dizziness). , Disp: , Rfl:  . NOVOLOG FLEXPEN 100 UNIT/ML FlexPen, Inject 4-8 Units into the skin 3 (three) times daily., Disp: , Rfl: 0 . Olopatadine HCl (PATADAY) 0.2 % SOLN, Place 1 drop into both eyes 2 (two) times daily. , Disp: , Rfl:  . ondansetron (ZOFRAN) 4 MG tablet, Take 1 tablet (4 mg total) by mouth every 6 (six) hours as needed for nausea or vomiting., Disp: 60 tablet, Rfl: 0 . pantoprazole (PROTONIX) 40 MG tablet, Take 1 tablet (40 mg total) by mouth daily at 6 (six) AM., Disp: 30 tablet, Rfl: 2 . potassium chloride SA (K-DUR,KLOR-CON) 20 MEQ tablet, Take 1 tablet (20 mEq total) by mouth daily., Disp: 30 tablet, Rfl: 2 . predniSONE (DELTASONE) 10 MG tablet, Take 3 tablets (30 mg total) by mouth daily with breakfast. (Patient taking differently: Take 20 mg by mouth daily with breakfast. ), Disp: 90 tablet, Rfl: 1 . propranolol (INDERAL) 80 MG tablet, Take 1 tablet (80 mg total) by mouth 2 (two) times daily., Disp: 30 tablet, Rfl: 1 . moxifloxacin (AVELOX) 400 MG tablet, Take 1 tablet (400 mg total) by mouth daily., Disp: 7 tablet, Rfl: 0   Objective:  Physical Exam. Blood pressure 187/100, pulse 105, resp. rate 19,SpO2 94 %. Gen.: Frail  appearing female. No apparent distress Neuro: No gross focal deficits. Neck: No JVD, lymphadenopathy, thyromegaly. RS: Reduced breath sounds over the left lower chest. Dullness to percussion. No crackles or wheezes heard. CVS: S1-S2 heard, no murmurs rubs gallops. Abdomen: Soft, positive bowel sounds. Extremities: No edema.     Assessment & Plan: #1 Sarcoidosis, stage III #2 Asthmatic bronchitis. #3 Cough #4 Left pleural effusion.  Proceed with ultrasound guided thoracentesis.   Marshell Garfinkel MD [facility] Pager [phone] If no answer or after 3pm call: [phone] 03/29/2015, 11:54 AM

## 2015-03-30 LAB — RHEUMATOID FACTORS, FLUID: Rheumatoid Arthritis, Qn/Fluid: NEGATIVE

## 2015-04-01 LAB — PH, BODY FLUID: pH, Body Fluid: 8.1

## 2015-04-02 LAB — CHOLESTEROL, BODY FLUID: Cholesterol, Fluid: 110 mg/dL

## 2015-04-03 ENCOUNTER — Telehealth: Payer: Self-pay | Admitting: Internal Medicine

## 2015-04-03 LAB — CULTURE, BODY FLUID-BOTTLE

## 2015-04-03 LAB — ADENOSIDE DEAMINASE, PLEURAL FL: ADENOSIDE DEAMINASE, PLEURAL FL: 7 U/L (ref 0.0–9.4)

## 2015-04-03 LAB — ANA, BODY FLUID: Anti-Nuclear Ab, IgG: NOT DETECTED

## 2015-04-03 LAB — CULTURE, BODY FLUID W GRAM STAIN -BOTTLE: Culture: NO GROWTH

## 2015-04-03 NOTE — Telephone Encounter (Signed)
Spoke with patient and she misplaced her instruction sheet given to her back in Oct.  Gave her all the instructions again

## 2015-04-03 NOTE — Telephone Encounter (Signed)
New message     Pt is having a defibulator put in tomorrow morning.  Please call and tell her which medications she can/cannot take prior to procedure

## 2015-04-04 ENCOUNTER — Ambulatory Visit (HOSPITAL_COMMUNITY)
Admission: RE | Admit: 2015-04-04 | Discharge: 2015-04-04 | Disposition: A | Discharge status: Home / Self Care | Payer: Medicaid Other | Source: Ambulatory Visit | Attending: Internal Medicine | Admitting: Internal Medicine

## 2015-04-04 ENCOUNTER — Encounter (HOSPITAL_COMMUNITY)
Admission: RE | Disposition: A | Discharge status: Home / Self Care | Payer: Medicaid Other | Source: Ambulatory Visit | Attending: Internal Medicine

## 2015-04-04 ENCOUNTER — Encounter (HOSPITAL_COMMUNITY): Payer: Self-pay | Admitting: Internal Medicine

## 2015-04-04 DIAGNOSIS — I4901 Ventricular fibrillation: Secondary | ICD-10-CM

## 2015-04-04 DIAGNOSIS — Z91041 Radiographic dye allergy status: Secondary | ICD-10-CM | POA: Diagnosis not present

## 2015-04-04 DIAGNOSIS — I472 Ventricular tachycardia: Secondary | ICD-10-CM | POA: Insufficient documentation

## 2015-04-04 DIAGNOSIS — Z4502 Encounter for adjustment and management of automatic implantable cardiac defibrillator: Secondary | ICD-10-CM | POA: Diagnosis not present

## 2015-04-04 DIAGNOSIS — I4721 Torsades de pointes: Secondary | ICD-10-CM

## 2015-04-04 DIAGNOSIS — K219 Gastro-esophageal reflux disease without esophagitis: Secondary | ICD-10-CM | POA: Insufficient documentation

## 2015-04-04 DIAGNOSIS — Z880 Allergy status to penicillin: Secondary | ICD-10-CM | POA: Insufficient documentation

## 2015-04-04 DIAGNOSIS — J45909 Unspecified asthma, uncomplicated: Secondary | ICD-10-CM | POA: Insufficient documentation

## 2015-04-04 DIAGNOSIS — Z794 Long term (current) use of insulin: Secondary | ICD-10-CM | POA: Insufficient documentation

## 2015-04-04 DIAGNOSIS — R9431 Abnormal electrocardiogram [ECG] [EKG]: Secondary | ICD-10-CM | POA: Diagnosis present

## 2015-04-04 DIAGNOSIS — Z95810 Presence of automatic (implantable) cardiac defibrillator: Secondary | ICD-10-CM | POA: Diagnosis present

## 2015-04-04 DIAGNOSIS — E1040 Type 1 diabetes mellitus with diabetic neuropathy, unspecified: Secondary | ICD-10-CM | POA: Insufficient documentation

## 2015-04-04 HISTORY — PX: EP IMPLANTABLE DEVICE: SHX172B

## 2015-04-04 LAB — SURGICAL PCR SCREEN
MRSA, PCR: NEGATIVE
Staphylococcus aureus: NEGATIVE

## 2015-04-04 LAB — GLUCOSE, CAPILLARY
GLUCOSE-CAPILLARY: 170 mg/dL — AB (ref 65–99)
Glucose-Capillary: 186 mg/dL — ABNORMAL HIGH (ref 65–99)
Glucose-Capillary: 141 mg/dL — ABNORMAL HIGH (ref 65–99)

## 2015-04-04 SURGERY — ICD/BIV ICD GENERATOR CHANGEOUT

## 2015-04-04 MED ORDER — SODIUM CHLORIDE 0.9 % IR SOLN
Status: AC
  Filled 2015-04-04: qty 2

## 2015-04-04 MED ORDER — MUPIROCIN 2 % EX OINT: TOPICAL_OINTMENT | Freq: Two times a day (BID) | CUTANEOUS | Status: DC

## 2015-04-04 MED ORDER — FENTANYL CITRATE (PF) 100 MCG/2ML IJ SOLN
INTRAMUSCULAR | Status: AC
  Filled 2015-04-04: qty 2

## 2015-04-04 MED ORDER — VANCOMYCIN HCL IN DEXTROSE 1-5 GM/200ML-% IV SOLN
1000.0000 mg | INTRAVENOUS | Status: AC
  Administered 2015-04-04: 1000 mg via INTRAVENOUS
  Filled 2015-04-04 (×3): qty 200

## 2015-04-04 MED ORDER — DIPHENHYDRAMINE HCL 50 MG/ML IJ SOLN
INTRAMUSCULAR | Status: AC
  Filled 2015-04-04: qty 1

## 2015-04-04 MED ORDER — VANCOMYCIN HCL 1000 MG IV SOLR: 1000.0000 mg | INTRAVENOUS | Status: DC | PRN

## 2015-04-04 MED ORDER — SODIUM CHLORIDE 0.9 % IV SOLN
INTRAVENOUS | Status: DC
  Administered 2015-04-04: 06:00:00 via INTRAVENOUS

## 2015-04-04 MED ORDER — CYCLOBENZAPRINE HCL 10 MG PO TABS: 10.0000 mg | ORAL_TABLET | Freq: Three times a day (TID) | ORAL | Status: DC | PRN

## 2015-04-04 MED ORDER — FAMOTIDINE IN NACL 20-0.9 MG/50ML-% IV SOLN
20.0000 mg | Freq: Once | INTRAVENOUS | Status: AC
  Administered 2015-04-04: 20 mg via INTRAVENOUS

## 2015-04-04 MED ORDER — MIDAZOLAM HCL 5 MG/5ML IJ SOLN
INTRAMUSCULAR | Status: AC
  Filled 2015-04-04: qty 5

## 2015-04-04 MED ORDER — ACETAMINOPHEN 325 MG PO TABS: 325.0000 mg | ORAL_TABLET | ORAL | Status: DC | PRN

## 2015-04-04 MED ORDER — LIDOCAINE HCL (PF) 1 % IJ SOLN
INTRAMUSCULAR | Status: AC
  Filled 2015-04-04: qty 60

## 2015-04-04 MED ORDER — FENTANYL CITRATE (PF) 100 MCG/2ML IJ SOLN
INTRAMUSCULAR | Status: AC
Start: 2015-04-04 — End: 2015-04-04
  Filled 2015-04-04: qty 2

## 2015-04-04 MED ORDER — ONDANSETRON HCL 4 MG/2ML IJ SOLN: 4.0000 mg | Freq: Four times a day (QID) | INTRAMUSCULAR | Status: DC | PRN

## 2015-04-04 MED ORDER — DIPHENHYDRAMINE HCL 50 MG/ML IJ SOLN
25.0000 mg | Freq: Once | INTRAMUSCULAR | Status: AC
  Administered 2015-04-04: 25 mg via INTRAVENOUS

## 2015-04-04 MED ORDER — LIDOCAINE HCL (PF) 1 % IJ SOLN
INTRAMUSCULAR | Status: DC | PRN
  Administered 2015-04-04: 5 mL
  Administered 2015-04-04: 80 mL
  Administered 2015-04-04: 3 mL

## 2015-04-04 MED ORDER — LIDOCAINE HCL (PF) 1 % IJ SOLN
INTRAMUSCULAR | Status: AC
  Filled 2015-04-04: qty 30

## 2015-04-04 MED ORDER — HEPARIN (PORCINE) IN NACL 2-0.9 UNIT/ML-% IJ SOLN
INTRAMUSCULAR | Status: AC
  Filled 2015-04-04: qty 500

## 2015-04-04 MED ORDER — KETOROLAC TROMETHAMINE 30 MG/ML IJ SOLN
30.0000 mg | Freq: Once | INTRAMUSCULAR | Status: AC
Start: 2015-04-04 — End: 2015-04-04
  Administered 2015-04-04: 30 mg via INTRAVENOUS
  Filled 2015-04-04: qty 1

## 2015-04-04 MED ORDER — FENTANYL CITRATE (PF) 100 MCG/2ML IJ SOLN
INTRAMUSCULAR | Status: DC | PRN
  Administered 2015-04-04 (×6): 12.5 ug via INTRAVENOUS
  Administered 2015-04-04: 25 ug via INTRAVENOUS
  Administered 2015-04-04 (×3): 12.5 ug via INTRAVENOUS

## 2015-04-04 MED ORDER — FENTANYL CITRATE (PF) 100 MCG/2ML IJ SOLN
25.0000 ug | INTRAMUSCULAR | Status: DC | PRN
  Administered 2015-04-04: 25 ug via INTRAVENOUS

## 2015-04-04 MED ORDER — MUPIROCIN 2 % EX OINT
TOPICAL_OINTMENT | CUTANEOUS | Status: AC
  Administered 2015-04-04: 1
  Filled 2015-04-04: qty 22

## 2015-04-04 MED ORDER — GABAPENTIN 100 MG PO CAPS: 100.0000 mg | ORAL_CAPSULE | Freq: Three times a day (TID) | ORAL | Status: DC

## 2015-04-04 MED ORDER — KETOROLAC TROMETHAMINE 60 MG/2ML IM SOLN: 60.0000 mg | Freq: Once | INTRAMUSCULAR | Status: DC

## 2015-04-04 MED ORDER — MIDAZOLAM HCL 5 MG/5ML IJ SOLN
INTRAMUSCULAR | Status: DC | PRN
  Administered 2015-04-04 (×11): 1 mg via INTRAVENOUS

## 2015-04-04 MED ORDER — SODIUM CHLORIDE 0.9 % IR SOLN
80.0000 mg | Status: DC
  Filled 2015-04-04: qty 2

## 2015-04-04 MED ORDER — FAMOTIDINE IN NACL 20-0.9 MG/50ML-% IV SOLN
INTRAVENOUS | Status: AC
  Filled 2015-04-04: qty 50

## 2015-04-04 SURGICAL SUPPLY — 7 items
CABLE SURGICAL S-101-97-12 (CABLE) ×3 IMPLANT
DEVICE DISSECT PLASMABLAD 3.0S (MISCELLANEOUS) ×1 IMPLANT
ICD VISIA AF VR DVAB1D1 (ICD Generator) ×1 IMPLANT
PAD DEFIB LIFELINK (PAD) ×3 IMPLANT
PLASMABLADE 3.0S (MISCELLANEOUS) ×3
TRAY PACEMAKER INSERTION (PACKS) ×3 IMPLANT
VISIA AF VR DVAB1D1 (ICD Generator) ×3 IMPLANT

## 2015-04-04 NOTE — Progress Notes (Signed)
Pt states she is feeling more comfortable, able to move arm without complaint. Pt alert and oriented and wants to go home, pt readied for discharge.

## 2015-04-04 NOTE — Progress Notes (Signed)
Itching subsided. No longer scratching.

## 2015-04-04 NOTE — Progress Notes (Signed)
The patient requested something for pain, with moderate pain in the recovery area, her procedure was a subpectoral implant, Dr. Lovena Le wanted her to go home with Percocet.  She was given an Rx for Percocet 5/325mg  tab, PO Q6PRN for moderate pain, #12 with no refills.  Tommye Standard, PA-C  Mikle Bosworth.D.

## 2015-04-04 NOTE — Progress Notes (Signed)
Dr Lovena Le in to see client and client c/o 8/10 left chest discomfort and per Dr Lovena Le will remove pressure dressing in 10min

## 2015-04-04 NOTE — H&P (Signed)
  ICD Criteria  Current LVEF:55%. Within 12 months prior to implant: No   Heart failure history: No  Cardiomyopathy history: No.  Atrial Fibrillation/Atrial Flutter: No.  Ventricular tachycardia history: Yes, Hemodynamic instability present. VT Type: Sustained Ventricular Tachycardia - Polymorphic.  Cardiac arrest history: No.  History of syndromes with risk of sudden death: Yes, Long QT Syndrome  Previous ICD: Yes, Reason for ICD:  Primary prevention.  Current ICD indication: Secondary  PPM indication: No.   Class I or II Bradycardia indication present: No  Beta Blocker therapy for 3 or more months: Yes, prescribed.   Ace Inhibitor/ARB therapy for 3 or more months: No, medical reason.

## 2015-04-04 NOTE — Discharge Instructions (Signed)

## 2015-04-04 NOTE — H&P (View-Only) (Signed)
HPI [name] returns today for followup. She is a pleasant 44-year-old woman with a history of long QT syndrome and a very strong family history of sudden cardiac death, status post ICD implantation. She also has a history of SVT and is status post catheter ablation. 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 She has dyspnea with exertion and has had an echo at [facility] normal LV function. She was at [facility] undergoing hysterectomy of a leiomioma. She has early satiety and some anorexia. She was hospitalized several weeks ago with a sarcoid flair and placed on steroids. She has reached ERI on her device.  Allergies  Allergen Reactions  . Contrast Media [Iodinated Diagnostic Agents] Anaphylaxis and Shortness Of Breath    Needed to be defibrillated   . Augmentin [Amoxicillin-Pot Clavulanate] Other (See Comments)    "stomach hurt"  . Codeine Nausea And Vomiting    jittery  . Iohexol      Code: RASH, Desc: PT STATES SHE IS ALLERGIC TO IV CONTRAST 05/28/06/RM, Onset Date: [id number]   . Meperidine Hcl Nausea And Vomiting  . Morphine And Related Nausea And Vomiting     Current Outpatient Prescriptions  Medication Sig Dispense Refill  . acetaminophen (TYLENOL) 500 MG tablet Take 1,000 mg by mouth every 6 (six) hours as needed for mild pain or fever.    . azithromycin (ZITHROMAX) 250 MG tablet Take 1 tablet (250 mg total) by mouth daily. Take first 2 tablets together, then 1 every day until finished. 4 tablet 0  . beclomethasone (QVAR) 80 MCG/ACT inhaler Inhale 2 puffs into the lungs 2 (two) times daily. 1 Inhaler 5  . clonazePAM (KLONOPIN) 0.5 MG tablet Take 1 tablet (0.5 mg total) by mouth daily. 30 tablet 0  . cyclobenzaprine (FLEXERIL) 10 MG tablet Take 10 mg by mouth 3 (three) times daily as needed for muscle spasms.     . cycloSPORINE (RESTASIS) 0.05 % ophthalmic emulsion Place 1 drop into both eyes 2 (two) times daily. 0.4  mL 0  . fluticasone (FLONASE) 50 MCG/ACT nasal spray Place 2 sprays into both nostrils daily as needed for allergies.     . gabapentin (NEURONTIN) 100 MG capsule Take 1 capsule (100 mg total) by mouth 3 (three) times daily. 90 capsule 2  . Insulin Glargine (LANTUS SOLOSTAR) 100 UNIT/ML Solostar Pen Inject 35 Units into the skin at bedtime.    . Linaclotide (LINZESS) 145 MCG CAPS capsule Take 1 capsule (145 mcg total) by mouth daily. 30 capsule 3  . meclizine (ANTIVERT) 25 MG tablet Take 12.5 mg by mouth 3 (three) times daily as needed for dizziness (take 1/2 to 1 tablet by mouth as needed for dizziness).     . NOVOLOG FLEXPEN 100 UNIT/ML FlexPen Inject 4-8 Units into the skin 3 (three) times daily.  0  . Olopatadine HCl (PATADAY) 0.2 % SOLN Place 1 drop into both eyes 2 (two) times daily.     . ondansetron (ZOFRAN) 4 MG tablet Take 1 tablet (4 mg total) by mouth every 6 (six) hours as needed for nausea or vomiting. 60 tablet 0  . pantoprazole (PROTONIX) 40 MG tablet Take 1 tablet (40 mg total) by mouth daily at 6 (six) AM. 30 tablet 2  . potassium chloride SA (K-DUR,KLOR-CON) 20 MEQ tablet Take 1 tablet (20 mEq total) by mouth daily. 30 tablet 2  . predniSONE (DELTASONE) 10 MG tablet Take 3 tablets (30 mg total) by   mouth daily with breakfast. 90 tablet 1  . propranolol (INDERAL) 80 MG tablet Take 1 tablet (80 mg total) by mouth 2 (two) times daily. 30 tablet 1   No current facility-administered medications for this visit.     Past Medical History  Diagnosis Date  . Long Q-T syndrome   . Palpitation   . Seizure disorder (HCC)     none recently  . Abdominal pain, periumbilic   . Esophageal stricture 2005/2009    esophageal strictures dilated 2005, 2009  . Sarcoidosis (HCC)   . Allergic rhinitis   . Bronchitis   . GERD (gastroesophageal reflux disease) 2005  . Asthma   . Fibromyalgia   . Stroke (HCC)     QUESTIONABLE TIA   . Neuropathy in diabetes (HCC)   . Seizures (HCC)      HISTORY OF TAKING KLONIPIN NOT RX AT THE MOMENT  . Cancer (HCC)     Nodules in lungs pt believes were cancerous pt unsure  . Arthritis   . Internal hemorrhoids 2010    2011 band ligation of int rrhoids.  . Eye hemorrhage     bilateral  . Fibroid, uterine   . Automatic implantable cardioverter-defibrillator in situ 2006, replaced 2008    MEDTRONIC  . DM (diabetes mellitus) (HCC) 1997/1998    type 1, initially gestational but soon after child birth developed IDDM  . History of blood transfusion years ago    ROS:   All systems reviewed and negative except as noted in the HPI.   Past Surgical History  Procedure Laterality Date  . Tubal ligation    . Hand surgery    . Svt ablation    . Automatic implantable cardiac defibrillator situ  2006,     ICD-Medtronic   Remote - Yes   . Hemorrhoid banding  03/2010  . Dilitation & currettage/hystroscopy with hydrothermal ablation N/A 06/07/2013    Procedure: DILATATION & CURETTAGE/HYSTEROSCOPY WITH attempted HYDROTHERMAL ABLATION;  Surgeon: Bernard A Marshall, MD;  Location: [facility];  Service: Gynecology;  Laterality: N/A;  . Esophagogastroduodenoscopy (egd) with propofol N/A 10/24/2014    Procedure: ESOPHAGOGASTRODUODENOSCOPY (EGD) WITH PROPOFOL;  Surgeon: Robert D Kaplan, MD;  Location: [facility];  Service: Endoscopy;  Laterality: N/A;  . Total abdominal hysterectomy w/ bilateral salpingoophorectomy  11/06/14    at [facility]. for menorrhagia, pelvic pain and enlarging uterine fibroids  . Exploratory laparotomy  11/06/14    evacuation of post TAH/BSO hematoma  . Abdominal hysterectomy    . Salpingectomy    . Cervix removal    . Video bronchoscopy Bilateral 01/24/2015    Procedure: VIDEO BRONCHOSCOPY WITHOUT FLUORO;  Surgeon: Praveen Mannam, MD;  Location: [facility];  Service: Cardiopulmonary;  Laterality: Bilateral;     Family History  Problem Relation Age of Onset  . Lung cancer Maternal Uncle     x2  . Breast cancer Maternal Aunt     x2   . Stomach cancer Paternal Uncle   . Stomach cancer Paternal Grandfather   . Stomach cancer Paternal Grandmother      Social History   Social History  . Marital Status: Single    Spouse Name: N/A  . Number of Children: N/A  . Years of Education: N/A   Occupational History  . Not on file.   Social History Main Topics  . Smoking status: Never Smoker   . Smokeless tobacco: Never Used  . Alcohol Use: 0.0 oz/week    0 Standard drinks or equivalent per week       Comment: occ.  . Drug Use: No  . Sexual Activity: No   Other Topics Concern  . Not on file   Social History Narrative     BP 130/84 mmHg  Pulse 65  Ht 5' 6" (1.676 m)  Wt 141 lb 9.6 oz (64.229 kg)  BMI 22.87 kg/m2  LMP 07/23/2014  Physical Exam:  Chronically ill appearing 44-year-old woman, NAD HEENT: Unremarkable Neck:  6 cm JVD, no thyromegally Back:  No CVA tenderness Lungs:  Clear with no wheezes, rales, or rhonchi. HEART:  Regular rate rhythm, no murmurs, no rubs, no clicks Abd:  soft, positive bowel sounds, no organomegally, no rebound, no guarding Ext:  2 plus pulses, no edema, no cyanosis, no clubbing Skin:  No rashes no nodules Neuro:  CN II through XII intact, motor grossly intact  DEVICE  Normal device function.  See PaceArt for details. Reached ERI.  Assess/Plan: 

## 2015-04-04 NOTE — Progress Notes (Signed)
Itching, scratching legs, arms; states bottom itches. No rash notified  Dr. Lovena Le made aware. Orders taken.

## 2015-04-04 NOTE — Interval H&P Note (Signed)
History and Physical Interval Note:  04/04/2015 7:47 AM  [name]  has presented today for surgery, with the diagnosis of eri  The various methods of treatment have been discussed with the patient and family. After consideration of risks, benefits and other options for treatment, the patient has consented to  Procedure(s):  ICD [employer] (N/A) as a surgical intervention .  The patient's history has been reviewed, patient examined, no change in status, stable for surgery.  I have reviewed the patient's chart and labs.  Questions were answered to the patient's satisfaction.     Cristopher Peru

## 2015-04-05 MED FILL — Sodium Chloride Irrigation Soln 0.9%: Qty: 500 | Status: AC

## 2015-04-05 MED FILL — Heparin Sodium (Porcine) 2 Unit/ML in Sodium Chloride 0.9%: INTRAMUSCULAR | Qty: 500 | Status: AC

## 2015-04-05 MED FILL — Gentamicin Sulfate Inj 40 MG/ML: INTRAMUSCULAR | Qty: 2 | Status: AC

## 2015-04-08 ENCOUNTER — Encounter (HOSPITAL_COMMUNITY): Payer: Self-pay | Admitting: Internal Medicine

## 2015-04-08 ENCOUNTER — Telehealth: Payer: Self-pay | Admitting: Pulmonary Disease

## 2015-04-08 NOTE — Telephone Encounter (Signed)
Spoke with pt. She c/o dry cough and SOB unchanged. No wheezing, no chest tx, no f/c/s/n/v.  She is also requesting her thoracentesis results from 03/29/15. Dr. Vaughan Browner on scheduled states 11 pm elink.  Will forward to DOD Dr. Lamonte Sakai. thanks

## 2015-04-08 NOTE — Telephone Encounter (Signed)
lmtcb X1 for pt to relay results/recs.  

## 2015-04-08 NOTE — Telephone Encounter (Signed)
Please let the patient know that there are inflammatory cells in the pleural fluid that are suggestive of sarcoidosis inflammation. We will forward these results to Dr Vaughan Browner to decide how to proceed, whether any changes to her medications are needed based on these results and her symptoms.

## 2015-04-09 ENCOUNTER — Telehealth: Payer: Self-pay | Admitting: Pulmonary Disease

## 2015-04-09 MED ORDER — PREDNISONE 10 MG PO TABS: 40.0000 mg | ORAL_TABLET | Freq: Every day | ORAL | Status: DC

## 2015-04-09 NOTE — Telephone Encounter (Signed)
Spoke with pt. She is aware of Mannam's recommendation. New rx has been sent in per her request. Nothing further was needed.

## 2015-04-09 NOTE — Telephone Encounter (Signed)
Spoke with Merrilee Seashore at [facility]. Gave the verbal to switch medication to TP. Nothing further was needed.

## 2015-04-09 NOTE — Telephone Encounter (Signed)
Increase steroids to 40 mg. I will reassess on her visit with me next month.

## 2015-04-17 ENCOUNTER — Ambulatory Visit (INDEPENDENT_AMBULATORY_CARE_PROVIDER_SITE_OTHER): Payer: Medicaid Other | Admitting: *Deleted

## 2015-04-17 ENCOUNTER — Encounter: Payer: Self-pay | Admitting: Internal Medicine

## 2015-04-17 DIAGNOSIS — I4581 Long QT syndrome: Secondary | ICD-10-CM | POA: Diagnosis not present

## 2015-04-17 LAB — CUP PACEART INCLINIC DEVICE CHECK
Battery Remaining Longevity: 136 mo
Battery Voltage: 3.07 V
HIGH POWER IMPEDANCE MEASURED VALUE: 39 Ohm
HIGH POWER IMPEDANCE MEASURED VALUE: 59 Ohm
Implantable Lead Implant Date: 20080828
Lead Channel Impedance Value: 437 Ohm
Lead Channel Pacing Threshold Amplitude: 1.25 V
Lead Channel Pacing Threshold Pulse Width: 0.4 ms
Lead Channel Sensing Intrinsic Amplitude: 6.375 mV
Lead Channel Sensing Intrinsic Amplitude: 6.5 mV
Lead Channel Setting Pacing Amplitude: 2.75 V
MDC IDC LEAD LOCATION: 753860
MDC IDC MSMT LEADCHNL RV IMPEDANCE VALUE: 342 Ohm
MDC IDC SESS DTM: 20161130094053
MDC IDC SET LEADCHNL RV PACING PULSEWIDTH: 0.4 ms
MDC IDC SET LEADCHNL RV SENSING SENSITIVITY: 0.3 mV
MDC IDC STAT BRADY RV PERCENT PACED: 0 %

## 2015-04-17 NOTE — Progress Notes (Signed)
Wound check appointment. Steri-strips removed. Wound without redness or edema. Incision edges approximated, wound well healed. Normal device function. Threshold, sensing, and impedances consistent with implant measurements. Device programmed at appropriate safety margins. Histogram distribution appropriate for patient and level of activity. No AT/AF or ventricular arrhythmias noted. Patient educated about wound care, arm mobility, and shock plan. ROV w/GT on 06/18/15 @ 0945.

## 2015-04-26 LAB — FUNGUS CULTURE W SMEAR: FUNGAL SMEAR: NONE SEEN

## 2015-05-06 ENCOUNTER — Ambulatory Visit: Payer: Medicaid Other | Admitting: Pulmonary Disease

## 2015-05-09 ENCOUNTER — Telehealth: Payer: Self-pay | Admitting: Pulmonary Disease

## 2015-05-09 NOTE — Telephone Encounter (Signed)
Left message for patient to call back  

## 2015-05-10 MED ORDER — AZITHROMYCIN 250 MG PO TABS
ORAL_TABLET | ORAL | Status: AC
Start: 2015-05-10 — End: 2015-05-15

## 2015-05-10 NOTE — Telephone Encounter (Signed)
[id number] 

## 2015-05-10 NOTE — Telephone Encounter (Signed)
Left message for patient to call back  

## 2015-05-10 NOTE — Telephone Encounter (Signed)
Left detailed message on pt's voicemail since it's almost closing time. Rx has been sent in. Nothing further was needed.

## 2015-05-10 NOTE — Telephone Encounter (Signed)
Pt returning call [phone]

## 2015-05-10 NOTE — Telephone Encounter (Signed)
Spoke with pt. States that her cough is getting worse. Reports that she is now producing yellow mucus when she coughs. SOB is also present. Denies chest tightness, wheezing or fever. Onset was a few months ago. Would like antibiotic sent in.  MW - please advise. Thanks.

## 2015-05-12 LAB — AFB CULTURE WITH SMEAR (NOT AT ARMC): SPECIAL REQUESTS: NORMAL

## 2015-05-21 ENCOUNTER — Telehealth: Payer: Self-pay | Admitting: Cardiology
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
## 2015-05-21 NOTE — Telephone Encounter (Signed)
Informed patient that remote was received. No episodes recorded. Lead measurements are stable. Patient to f/u as scheduled. Patient voiced understanding.

## 2015-05-30 ENCOUNTER — Ambulatory Visit (INDEPENDENT_AMBULATORY_CARE_PROVIDER_SITE_OTHER): Payer: Medicaid Other | Admitting: Pulmonary Disease

## 2015-05-30 ENCOUNTER — Encounter: Payer: Self-pay | Admitting: Pulmonary Disease

## 2015-05-30 VITALS — BP 158/102 | HR 90 | Ht 66.5 in | Wt 143.2 lb

## 2015-05-30 DIAGNOSIS — D860 Sarcoidosis of lung: Secondary | ICD-10-CM

## 2015-05-30 DIAGNOSIS — J449 Chronic obstructive pulmonary disease, unspecified: Secondary | ICD-10-CM | POA: Diagnosis not present

## 2015-05-30 DIAGNOSIS — D869 Sarcoidosis, unspecified: Secondary | ICD-10-CM | POA: Diagnosis not present

## 2015-05-30 DIAGNOSIS — J90 Pleural effusion, not elsewhere classified: Secondary | ICD-10-CM

## 2015-05-30 DIAGNOSIS — R05 Cough: Secondary | ICD-10-CM

## 2015-05-30 DIAGNOSIS — J948 Other specified pleural conditions: Secondary | ICD-10-CM | POA: Diagnosis not present

## 2015-05-30 DIAGNOSIS — J4489 Other specified chronic obstructive pulmonary disease: Secondary | ICD-10-CM

## 2015-05-30 DIAGNOSIS — R0600 Dyspnea, unspecified: Secondary | ICD-10-CM

## 2015-05-30 DIAGNOSIS — R059 Cough, unspecified: Secondary | ICD-10-CM

## 2015-05-30 MED ORDER — PREDNISONE 10 MG PO TABS: 40.0000 mg | ORAL_TABLET | Freq: Every day | ORAL | Status: DC

## 2015-05-30 NOTE — Addendum Note (Signed)
Addended by: Parke Poisson E on: 05/30/2015 10:11 AM   Modules accepted: Orders

## 2015-05-30 NOTE — Patient Instructions (Signed)
Continue the prednisone at 40 mg a day. We will have you come back to the clinic in 2 months. You will get a chest x-ray at the time of the next visit.
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
## 2015-06-12 ENCOUNTER — Telehealth: Payer: Self-pay | Admitting: Pulmonary Disease

## 2015-06-12 DIAGNOSIS — D869 Sarcoidosis, unspecified: Secondary | ICD-10-CM

## 2015-06-12 MED ORDER — DOXYCYCLINE HYCLATE 100 MG PO TABS: 100.0000 mg | ORAL_TABLET | Freq: Two times a day (BID) | ORAL | Status: DC

## 2015-06-12 NOTE — Telephone Encounter (Signed)
CXR ordered.  Patient states she will come by tomorrow to have CXR done. Rx sent to pharmacy. Patient aware

## 2015-06-12 NOTE — Telephone Encounter (Signed)
Patient is coughing so much that it is making her stomach hurt. Patient itchy all over, no rash.  Patient says her weight is dropping because she can hardly eat or drink. She said she is finally getting some clear mucus out, but only a little bit, not a lot.   Pharmacy: [facility], [place]  Allergies  Allergen Reactions  . Contrast Media [Iodinated Diagnostic Agents] Anaphylaxis and Shortness Of Breath    Needed to be defibrillated   . Augmentin [Amoxicillin-Pot Clavulanate] Other (See Comments)    "stomach hurt"  . Codeine Nausea And Vomiting    jittery  . Iohexol      Code: RASH, Desc: PT STATES SHE IS ALLERGIC TO IV CONTRAST 05/28/06/RM, Onset Date: [id number]   . Meperidine Hcl Nausea And Vomiting  . Morphine And Related Nausea And Vomiting

## 2015-06-12 NOTE — Telephone Encounter (Signed)
Increase prednsione to 60 mg and prescribe doxy 10 mg bid for 7 days. Order CXR.

## 2015-06-13 ENCOUNTER — Ambulatory Visit (INDEPENDENT_AMBULATORY_CARE_PROVIDER_SITE_OTHER)
Admission: RE | Admit: 2015-06-13 | Discharge: 2015-06-13 | Disposition: A | Discharge status: Home / Self Care | Payer: Medicaid Other | Source: Ambulatory Visit | Attending: Pulmonary Disease | Admitting: Pulmonary Disease

## 2015-06-13 DIAGNOSIS — D869 Sarcoidosis, unspecified: Secondary | ICD-10-CM | POA: Diagnosis not present

## 2015-06-17 ENCOUNTER — Ambulatory Visit (INDEPENDENT_AMBULATORY_CARE_PROVIDER_SITE_OTHER): Payer: Medicaid Other | Admitting: Physician Assistant

## 2015-06-17 ENCOUNTER — Encounter: Payer: Self-pay | Admitting: Physician Assistant

## 2015-06-17 ENCOUNTER — Other Ambulatory Visit (INDEPENDENT_AMBULATORY_CARE_PROVIDER_SITE_OTHER): Payer: Medicaid Other

## 2015-06-17 VITALS — BP 112/70 | HR 60 | Ht 66.5 in | Wt 138.2 lb

## 2015-06-17 DIAGNOSIS — R1012 Left upper quadrant pain: Secondary | ICD-10-CM | POA: Diagnosis not present

## 2015-06-17 DIAGNOSIS — R7989 Other specified abnormal findings of blood chemistry: Secondary | ICD-10-CM

## 2015-06-17 DIAGNOSIS — D869 Sarcoidosis, unspecified: Secondary | ICD-10-CM | POA: Diagnosis not present

## 2015-06-17 DIAGNOSIS — R1011 Right upper quadrant pain: Secondary | ICD-10-CM | POA: Diagnosis not present

## 2015-06-17 DIAGNOSIS — R945 Abnormal results of liver function studies: Secondary | ICD-10-CM

## 2015-06-17 LAB — CBC WITH DIFFERENTIAL/PLATELET
BASOS PCT: 2.6 % (ref 0.0–3.0)
Basophils Absolute: 0.2 10*3/uL — ABNORMAL HIGH (ref 0.0–0.1)
EOS PCT: 0.1 % (ref 0.0–5.0)
Eosinophils Absolute: 0 10*3/uL (ref 0.0–0.7)
HCT: 46.2 % — ABNORMAL HIGH (ref 36.0–46.0)
HEMOGLOBIN: 15.3 g/dL — AB (ref 12.0–15.0)
LYMPHS ABS: 1.1 10*3/uL (ref 0.7–4.0)
Lymphocytes Relative: 12.8 % (ref 12.0–46.0)
MCHC: 33 g/dL (ref 30.0–36.0)
MCV: 85.6 fl (ref 78.0–100.0)
MONOS PCT: 4 % (ref 3.0–12.0)
Monocytes Absolute: 0.4 10*3/uL (ref 0.1–1.0)
Neutro Abs: 7 10*3/uL (ref 1.4–7.7)
Neutrophils Relative %: 80.5 % — ABNORMAL HIGH (ref 43.0–77.0)
Platelets: 454 10*3/uL — ABNORMAL HIGH (ref 150.0–400.0)
RBC: 5.4 Mil/uL — AB (ref 3.87–5.11)
RDW: 14.4 % (ref 11.5–15.5)
WBC: 8.7 10*3/uL (ref 4.0–10.5)

## 2015-06-17 LAB — HEPATITIS A ANTIBODY, TOTAL: Hep A Total Ab: NONREACTIVE

## 2015-06-17 LAB — IBC PANEL
IRON: 77 ug/dL (ref 42–145)
SATURATION RATIOS: 19.4 % — AB (ref 20.0–50.0)
Transferrin: 284 mg/dL (ref 212.0–360.0)

## 2015-06-17 LAB — PROTIME-INR
INR: 1.1 ratio — ABNORMAL HIGH (ref 0.8–1.0)
Prothrombin Time: 11.7 s (ref 9.6–13.1)

## 2015-06-17 LAB — COMPREHENSIVE METABOLIC PANEL
ALK PHOS: 515 U/L — AB (ref 39–117)
ALT: 46 U/L — ABNORMAL HIGH (ref 0–35)
AST: 25 U/L (ref 0–37)
Albumin: 3.9 g/dL (ref 3.5–5.2)
BUN: 10 mg/dL (ref 6–23)
CO2: 24 mEq/L (ref 19–32)
CREATININE: 0.56 mg/dL (ref 0.40–1.20)
Calcium: 10.2 mg/dL (ref 8.4–10.5)
Chloride: 96 mEq/L (ref 96–112)
GFR: 151.47 mL/min (ref >60.00)
GLUCOSE: 398 mg/dL — AB (ref 70–99)
POTASSIUM: 4.4 meq/L (ref 3.5–5.1)
Sodium: 133 mEq/L — ABNORMAL LOW (ref 135–145)
TOTAL PROTEIN: 9.8 g/dL — AB (ref 6.0–8.3)
Total Bilirubin: 0.8 mg/dL (ref 0.2–1.2)

## 2015-06-17 LAB — C-REACTIVE PROTEIN: CRP: 2.8 mg/dL (ref 0.5–20.0)

## 2015-06-17 LAB — AMMONIA: AMMONIA: 63 umol/L — AB (ref 11–35)

## 2015-06-17 LAB — HEPATITIS B SURFACE ANTIGEN: Hepatitis B Surface Ag: NEGATIVE

## 2015-06-17 LAB — HEPATITIS B CORE ANTIBODY, TOTAL: Hep B Core Total Ab: NONREACTIVE

## 2015-06-17 LAB — HEPATITIS B SURFACE ANTIBODY,QUALITATIVE: Hep B S Ab: NEGATIVE

## 2015-06-17 LAB — TSH: TSH: 1.16 u[IU]/mL (ref 0.35–4.50)

## 2015-06-17 LAB — LIPASE: Lipase: 46 U/L (ref 11.0–59.0)

## 2015-06-17 LAB — IGA: IgA: 626 mg/dL — ABNORMAL HIGH (ref 68–378)

## 2015-06-17 LAB — FERRITIN: FERRITIN: 368.3 ng/mL — AB (ref 10.0–291.0)

## 2015-06-17 LAB — AMYLASE: Amylase: 17 U/L — ABNORMAL LOW (ref 27–131)

## 2015-06-17 NOTE — Patient Instructions (Signed)
We have scheduled you a follow up appt with Dr. Havery Moros  Your physician has requested that you go to the basement for lab work before leaving today.  You have been scheduled for a CT scan of the abdomen and pelvis at [facility].   You are scheduled on 06/20/15 at 10 am. You should arrive 15 minutes prior to your appointment time for registration. Please follow the written instructions below on the day of your exam:  Please go to Radiology at [facility] to pick up your prep.   1) Do not eat anything after 6 am (4 hours prior to your test) 2) You have been given 2 bottles of oral contrast to drink. The solution may taste better if refrigerated, but do NOT add ice or any other liquid to this solution. Shake well before drinking.    Drink 1 bottle of contrast @ 8 am (2 hours prior to your exam)  Drink 1 bottle of contrast @ 9 am (1 hour prior to your exam)  You may take any medications as prescribed with a small amount of water except for the following: Metformin, Glucophage, Glucovance, Avandamet, Riomet, Fortamet, Actoplus Met, Janumet, Glumetza or Metaglip. The above medications must be held the day of the exam AND 48 hours after the exam.  The purpose of you drinking the oral contrast is to aid in the visualization of your intestinal tract. The contrast solution may cause some diarrhea. Before your exam is started, you will be given a small amount of fluid to drink. Depending on your individual set of symptoms, you may also receive an intravenous injection of x-ray contrast/dye. Plan on being at [facility] for 30 minutes or longer, depending on the type of exam you are having performed.  This test typically takes 30-45 minutes to complete.  If you have any questions regarding your exam or if you need to reschedule, you may call the CT department at [phone] between the hours of 8:00 am and 5:00 pm,  Monday-Friday.  ________________________________________________________________________

## 2015-06-17 NOTE — Progress Notes (Signed)
Agree with the assessment and plan as outlined. Marked elevation in AP historically with elevated GGT. This could represent hepatic sarcoidosis however will further assess for other chronic liver diseases as outlined below and reimage her abdomen given progressive abdominal pain. Will proceed with CT imaging given unclear if she can have MRI due to ICD. She may warrant a liver biopsy pending the results to clarify what is driving this process.
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
## 2015-06-18 ENCOUNTER — Encounter: Payer: Self-pay | Admitting: Internal Medicine

## 2015-06-18 ENCOUNTER — Ambulatory Visit (INDEPENDENT_AMBULATORY_CARE_PROVIDER_SITE_OTHER): Payer: Medicaid Other | Admitting: Internal Medicine

## 2015-06-18 VITALS — BP 126/90 | HR 74 | Ht 66.0 in | Wt 140.8 lb

## 2015-06-18 DIAGNOSIS — I4581 Long QT syndrome: Secondary | ICD-10-CM

## 2015-06-18 LAB — CUP PACEART INCLINIC DEVICE CHECK
Battery Voltage: 3.12 V
Date Time Interrogation Session: 20170131100744
HIGH POWER IMPEDANCE MEASURED VALUE: 66 Ohm
HighPow Impedance: 47 Ohm
Implantable Lead Implant Date: 20080828
Lead Channel Pacing Threshold Amplitude: 1.25 V
Lead Channel Sensing Intrinsic Amplitude: 8.5 mV
Lead Channel Setting Pacing Amplitude: 2.75 V
MDC IDC LEAD LOCATION: 753860
MDC IDC MSMT BATTERY REMAINING LONGEVITY: 136 mo
MDC IDC MSMT LEADCHNL RV IMPEDANCE VALUE: 342 Ohm
MDC IDC MSMT LEADCHNL RV IMPEDANCE VALUE: 437 Ohm
MDC IDC MSMT LEADCHNL RV PACING THRESHOLD PULSEWIDTH: 0.4 ms
MDC IDC SET LEADCHNL RV PACING PULSEWIDTH: 0.4 ms
MDC IDC SET LEADCHNL RV SENSING SENSITIVITY: 0.3 mV
MDC IDC STAT BRADY RV PERCENT PACED: 0.01 %

## 2015-06-18 LAB — AFP TUMOR MARKER: AFP-Tumor Marker: 1.9 ng/mL (ref <6.1)

## 2015-06-18 LAB — ANA: ANA: NEGATIVE

## 2015-06-18 LAB — TISSUE TRANSGLUTAMINASE, IGA: Tissue Transglutaminase Ab, IgA: 1 U/mL (ref <4)

## 2015-06-18 LAB — IGG 4: IGG 4: 104 mg/dL (ref 1–291)

## 2015-06-18 NOTE — Patient Instructions (Signed)
Medication Instructions:  Your physician recommends that you continue on your current medications as directed. Please refer to the Current Medication list given to you today.   Labwork: None ordered   Testing/Procedures: None ordered   Follow-Up: Your physician wants you to follow-up in: 12 months with Dr Knox Saliva will receive a reminder letter in the mail two months in advance. If you don't receive a letter, please call our office to schedule the follow-up appointment.  Remote monitoring is used to monitor your ICD from home. This monitoring reduces the number of office visits required to check your device to one time per year. It allows [place] to keep an eye on the functioning of your device to ensure it is working properly. You are scheduled for a device check from home on 09/17/15. You may send your transmission at any time that day. If you have a wireless device, the transmission will be sent automatically. After your physician reviews your transmission, you will receive a postcard with your next transmission date.     Any Other Special Instructions Will Be Listed Below (If Applicable).     If you need a refill on your cardiac medications before your next appointment, please call your pharmacy.

## 2015-06-18 NOTE — Progress Notes (Signed)
HPI Mrs. Durocher returns today for followup. She is a pleasant 45 year old woman with a history of long QT syndrome and a very strong family history of sudden cardiac death, status post ICD implantation. She also has a history of SVT and is status post catheter ablation. She has had VF and received appropriate ICD shocks in the past. She underwent ICD generator change out 3 months ago and has healed up nicely. She has had no additional VT and no ICD shocks.  She has been seen by pulmonary for her sarcoid and had her dose of prednisone increased. Allergies  Allergen Reactions  . Contrast Media [Iodinated Diagnostic Agents] Anaphylaxis and Shortness Of Breath    Needed to be defibrillated   . Augmentin [Amoxicillin-Pot Clavulanate] Other (See Comments)    "stomach hurt"  . Codeine Nausea And Vomiting    jittery  . Iohexol      Code: RASH, Desc: PT STATES SHE IS ALLERGIC TO IV CONTRAST 05/28/06/RM, Onset Date: [id number]   . Meperidine Hcl Nausea And Vomiting  . Morphine And Related Nausea And Vomiting     Current Outpatient Prescriptions  Medication Sig Dispense Refill  . acetaminophen (TYLENOL) 500 MG tablet Take 1,000 mg by mouth every 6 (six) hours as needed for mild pain or fever.    . beclomethasone (QVAR) 80 MCG/ACT inhaler Inhale 2 puffs into the lungs 2 (two) times daily. 1 Inhaler 5  . clonazePAM (KLONOPIN) 0.5 MG tablet Take 1 tablet (0.5 mg total) by mouth daily. 30 tablet 0  . cyclobenzaprine (FLEXERIL) 10 MG tablet Take 10 mg by mouth 3 (three) times daily as needed for muscle spasms.     . cycloSPORINE (RESTASIS) 0.05 % ophthalmic emulsion Place 1 drop into both eyes 2 (two) times daily. 0.4 mL 0  . Dextromethorphan-Menthol (DELSYM COUGH RELIEF MT) Take 30 mLs by mouth daily as needed. Reported on 05/30/2015    . doxycycline (VIBRA-TABS) 100 MG tablet Take 1 tablet (100 mg total) by mouth 2 (two) times daily. 14 tablet 0  . fluticasone (FLONASE) 50 MCG/ACT nasal spray Place 2 sprays  into both nostrils daily as needed for allergies.     Marland Kitchen gabapentin (NEURONTIN) 100 MG capsule Take 1 capsule (100 mg total) by mouth 3 (three) times daily. 90 capsule 2  . Insulin Glargine (LANTUS SOLOSTAR) 100 UNIT/ML Solostar Pen Inject 35 Units into the skin at bedtime.    . Linaclotide (LINZESS) 145 MCG CAPS capsule Take 1 capsule (145 mcg total) by mouth daily. 30 capsule 3  . meclizine (ANTIVERT) 25 MG tablet Take 12.5 mg by mouth 3 (three) times daily as needed for dizziness (take 1/2 to 1 tablet by mouth as needed for dizziness).     . NOVOLOG FLEXPEN 100 UNIT/ML FlexPen Inject 4-8 Units into the skin 3 (three) times daily.  0  . ondansetron (ZOFRAN) 4 MG tablet Take 1 tablet (4 mg total) by mouth every 6 (six) hours as needed for nausea or vomiting. 60 tablet 0  . pantoprazole (PROTONIX) 40 MG tablet Take 1 tablet (40 mg total) by mouth daily at 6 (six) AM. 30 tablet 2  . potassium chloride SA (K-DUR,KLOR-CON) 20 MEQ tablet Take 1 tablet (20 mEq total) by mouth daily. 30 tablet 2  . predniSONE (DELTASONE) 10 MG tablet Take 4 tablets (40 mg total) by mouth daily with breakfast. (Patient taking differently: Take 60 mg by mouth daily with breakfast. ) 120 tablet 3  . propranolol (INDERAL) 80 MG tablet Take  1 tablet (80 mg total) by mouth 2 (two) times daily. 30 tablet 1   No current facility-administered medications for this visit.     Past Medical History  Diagnosis Date  . Long Q-T syndrome   . Palpitation   . Seizure disorder ([place])     none recently  . Abdominal pain, periumbilic   . Esophageal stricture 2005/2009    esophageal strictures dilated 2005, 2009  . Sarcoidosis ([place])   . Allergic rhinitis   . Bronchitis   . GERD (gastroesophageal reflux disease) 2005  . Asthma   . Fibromyalgia   . Stroke ([place])     QUESTIONABLE TIA   . Neuropathy in diabetes ([place])   . Seizures ([place])     HISTORY OF TAKING KLONIPIN NOT RX AT THE MOMENT  . Cancer ([place])     Nodules in lungs pt  believes were cancerous pt unsure  . Arthritis   . Internal hemorrhoids 2010    2011 band ligation of int rrhoids.  . Eye hemorrhage     bilateral  . Fibroid, uterine   . Automatic implantable cardioverter-defibrillator in situ 2006, replaced 2008    MEDTRONIC  . DM (diabetes mellitus) ([place]) 1997/1998    type 1, initially gestational but soon after child birth developed IDDM  . History of blood transfusion years ago    ROS:   All systems reviewed and negative except as noted in the HPI.   Past Surgical History  Procedure Laterality Date  . Tubal ligation    . Hand surgery    . Svt ablation    . Automatic implantable cardiac defibrillator situ  2006,     ICD-Medtronic   Remote - Yes   . Hemorrhoid banding  03/2010  . Dilitation & currettage/hystroscopy with hydrothermal ablation N/A 06/07/2013    Procedure: DILATATION & CURETTAGE/HYSTEROSCOPY WITH attempted HYDROTHERMAL ABLATION;  Surgeon: Frederico Hamman, MD;  Location: [facility];  Service: Gynecology;  Laterality: N/A;  . Esophagogastroduodenoscopy (egd) with propofol N/A 10/24/2014    Procedure: ESOPHAGOGASTRODUODENOSCOPY (EGD) WITH PROPOFOL;  Surgeon: Inda Castle, MD;  [facility];  Service: Endoscopy;  Laterality: N/A;  . Total abdominal hysterectomy w/ bilateral salpingoophorectomy  11/06/14    at [facility]. for menorrhagia, pelvic pain and enlarging uterine fibroids  . Exploratory laparotomy  11/06/14    evacuation of post TAH/BSO hematoma  . Abdominal hysterectomy    . Salpingectomy    . Cervix removal    . Video bronchoscopy Bilateral 01/24/2015    Procedure: VIDEO BRONCHOSCOPY WITHOUT FLUORO;  Surgeon: Marshell Garfinkel, MD;  Location: [facility];  Service: Cardiopulmonary;  Laterality: Bilateral;  . Ep implantable device N/A 04/04/2015    Procedure:  ICD Generator Changeout;  Surgeon: Evans Lance, MD;  Location: [facility];  Service: Cardiovascular;  Laterality: N/A;     Family History  Problem  Relation Age of Onset  . Lung cancer Maternal Uncle     x2  . Breast cancer Maternal Aunt     x2  . Stomach cancer Paternal Uncle   . Stomach cancer Paternal Grandfather   . Stomach cancer Paternal Grandmother   . Breast cancer Other   . Stomach cancer Other      Social History   Social History  . Marital Status: Single    Spouse Name: N/A  . Number of Children: N/A  . Years of Education: N/A   Occupational History  . Not on file.   Social History Main  Topics  . Smoking status: Never Smoker   . Smokeless tobacco: Never Used  . Alcohol Use: 0.0 oz/week    0 Standard drinks or equivalent per week     Comment: occ.  . Drug Use: No  . Sexual Activity: No   Other Topics Concern  . Not on file   Social History Narrative     BP 126/90 mmHg  Pulse 74  Ht 5\' 6"  (1.676 m)  Wt 140 lb 12.8 oz (63.866 kg)  BMI 22.74 kg/m2  LMP 07/23/2014  Physical Exam:  Chronically ill appearing 45 year old woman, NAD HEENT: Unremarkable Neck:  6 cm JVD, no thyromegally Back:  No CVA tenderness Lungs:  Clear with no wheezes, rales, or rhonchi. HEART:  Regular rate rhythm, no murmurs, no rubs, no clicks Abd:  soft, positive bowel sounds, no organomegally, no rebound, no guarding Ext:  2 plus pulses, no edema, no cyanosis, no clubbing Skin:  No rashes no nodules Neuro:  CN II through XII intact, motor grossly intact  DEVICE  Normal device function.  See PaceArt for details.   Assess/Plan: 1. Long QT - she is stable. She is taking Doxycycline. I do not see any worsening QT prolongation on ECG show this should be ok. 2. VF/PMVT - she has had no additional arrhythmias. No change in meds. She will continue her beta blocker. 3. Weight loss - it appears to have stabilized. Etiology remains obscure. Could all be do to sarcoid?  Mikle Bosworth.D.

## 2015-06-19 LAB — ALPHA-1-ANTITRYPSIN: A1 ANTITRYPSIN SER: 258 mg/dL — AB (ref 83–199)

## 2015-06-19 LAB — CERULOPLASMIN: CERULOPLASMIN: 59 mg/dL — AB (ref 18–53)

## 2015-06-19 LAB — MITOCHONDRIAL ANTIBODIES

## 2015-06-20 ENCOUNTER — Other Ambulatory Visit: Payer: Self-pay | Admitting: Emergency Medicine

## 2015-06-20 ENCOUNTER — Ambulatory Visit (HOSPITAL_COMMUNITY): Payer: Medicaid Other

## 2015-06-20 ENCOUNTER — Other Ambulatory Visit: Payer: Self-pay | Admitting: Physician Assistant

## 2015-06-20 ENCOUNTER — Other Ambulatory Visit: Payer: Self-pay | Admitting: *Deleted

## 2015-06-20 ENCOUNTER — Ambulatory Visit (HOSPITAL_COMMUNITY)
Admission: RE | Admit: 2015-06-20 | Discharge: 2015-06-20 | Disposition: A | Discharge status: Home / Self Care | Payer: Medicaid Other | Source: Ambulatory Visit | Attending: Physician Assistant | Admitting: Physician Assistant

## 2015-06-20 DIAGNOSIS — R7989 Other specified abnormal findings of blood chemistry: Secondary | ICD-10-CM

## 2015-06-20 DIAGNOSIS — R1012 Left upper quadrant pain: Secondary | ICD-10-CM

## 2015-06-20 DIAGNOSIS — R945 Abnormal results of liver function studies: Secondary | ICD-10-CM

## 2015-06-20 DIAGNOSIS — D869 Sarcoidosis, unspecified: Secondary | ICD-10-CM

## 2015-06-20 DIAGNOSIS — R1011 Right upper quadrant pain: Secondary | ICD-10-CM

## 2015-06-20 DIAGNOSIS — R1031 Right lower quadrant pain: Principal | ICD-10-CM

## 2015-06-20 DIAGNOSIS — G8929 Other chronic pain: Secondary | ICD-10-CM

## 2015-06-20 MED ORDER — LACTULOSE 10 GM/15ML PO SOLN: ORAL | Status: DC

## 2015-06-20 NOTE — Progress Notes (Signed)
Patient has a documented contrast allergy. With pre-medication, patient had anaphylaxis and needed to be defibrillated. Radiologist recommended patient have an MRI or ultrasound for symptoms since cannot have CT with contrast. Loretta Rubin Hvozdovic PA gave verbal to [facility]rouse to schedule ultrasound abdomen liver. Patient rescheduled for ultrasound abdomen liver.

## 2015-06-21 ENCOUNTER — Ambulatory Visit (HOSPITAL_COMMUNITY): Payer: Medicaid Other

## 2015-06-21 ENCOUNTER — Other Ambulatory Visit: Payer: Self-pay | Admitting: *Deleted

## 2015-06-21 LAB — ANTI-SMOOTH MUSCLE ANTIBODY, IGG: Smooth Muscle Ab: 21 U — ABNORMAL HIGH (ref <20)

## 2015-06-21 MED ORDER — LACTULOSE 10 GM/15ML PO SOLN: ORAL | Status: DC

## 2015-06-27 ENCOUNTER — Other Ambulatory Visit (INDEPENDENT_AMBULATORY_CARE_PROVIDER_SITE_OTHER): Payer: Medicaid Other

## 2015-06-27 DIAGNOSIS — R7989 Other specified abnormal findings of blood chemistry: Secondary | ICD-10-CM

## 2015-06-27 DIAGNOSIS — R945 Abnormal results of liver function studies: Secondary | ICD-10-CM

## 2015-06-27 LAB — AMMONIA: AMMONIA: 78 umol/L — AB (ref 11–35)

## 2015-06-28 ENCOUNTER — Other Ambulatory Visit: Payer: Self-pay | Admitting: *Deleted

## 2015-06-28 ENCOUNTER — Telehealth: Payer: Self-pay | Admitting: Pulmonary Disease

## 2015-06-28 DIAGNOSIS — R7989 Other specified abnormal findings of blood chemistry: Secondary | ICD-10-CM

## 2015-06-28 MED ORDER — LACTULOSE 10 GM/15ML PO SOLN: ORAL | Status: DC

## 2015-06-28 NOTE — Telephone Encounter (Signed)
Pt has already been made aware of results per result note at 10:01 today. Will sign off

## 2015-07-04 ENCOUNTER — Ambulatory Visit (HOSPITAL_COMMUNITY)
Admission: RE | Admit: 2015-07-04 | Discharge: 2015-07-04 | Disposition: A | Discharge status: Home / Self Care | Payer: Medicaid Other | Source: Ambulatory Visit | Attending: Physician Assistant | Admitting: Physician Assistant

## 2015-07-04 DIAGNOSIS — R1031 Right lower quadrant pain: Secondary | ICD-10-CM | POA: Diagnosis not present

## 2015-07-04 DIAGNOSIS — E119 Type 2 diabetes mellitus without complications: Secondary | ICD-10-CM | POA: Diagnosis not present

## 2015-07-04 DIAGNOSIS — D869 Sarcoidosis, unspecified: Secondary | ICD-10-CM | POA: Diagnosis not present

## 2015-07-04 DIAGNOSIS — M797 Fibromyalgia: Secondary | ICD-10-CM | POA: Insufficient documentation

## 2015-07-04 DIAGNOSIS — G8929 Other chronic pain: Secondary | ICD-10-CM | POA: Diagnosis not present

## 2015-07-04 DIAGNOSIS — R161 Splenomegaly, not elsewhere classified: Secondary | ICD-10-CM | POA: Diagnosis not present

## 2015-07-08 ENCOUNTER — Other Ambulatory Visit (INDEPENDENT_AMBULATORY_CARE_PROVIDER_SITE_OTHER): Payer: Medicaid Other

## 2015-07-08 DIAGNOSIS — R7989 Other specified abnormal findings of blood chemistry: Secondary | ICD-10-CM | POA: Diagnosis not present

## 2015-07-08 LAB — AMMONIA: AMMONIA: 59 umol/L — AB (ref 11–35)

## 2015-07-09 ENCOUNTER — Other Ambulatory Visit: Payer: Self-pay | Admitting: *Deleted

## 2015-07-09 ENCOUNTER — Telehealth: Payer: Self-pay | Admitting: Physician Assistant

## 2015-07-09 DIAGNOSIS — R7989 Other specified abnormal findings of blood chemistry: Secondary | ICD-10-CM

## 2015-07-09 MED ORDER — LACTULOSE 10 GM/15ML PO SOLN: ORAL | Status: DC

## 2015-07-09 NOTE — Telephone Encounter (Signed)
Called with results.

## 2015-07-18 ENCOUNTER — Other Ambulatory Visit (INDEPENDENT_AMBULATORY_CARE_PROVIDER_SITE_OTHER): Payer: Medicaid Other

## 2015-07-18 DIAGNOSIS — R7989 Other specified abnormal findings of blood chemistry: Secondary | ICD-10-CM | POA: Diagnosis not present

## 2015-07-18 LAB — AMMONIA: AMMONIA: 52 umol/L — AB (ref 11–35)

## 2015-07-22 ENCOUNTER — Other Ambulatory Visit (INDEPENDENT_AMBULATORY_CARE_PROVIDER_SITE_OTHER): Payer: Medicaid Other

## 2015-07-22 ENCOUNTER — Ambulatory Visit (INDEPENDENT_AMBULATORY_CARE_PROVIDER_SITE_OTHER): Payer: Medicaid Other | Admitting: Gastroenterology

## 2015-07-22 ENCOUNTER — Encounter: Payer: Self-pay | Admitting: Gastroenterology

## 2015-07-22 VITALS — BP 114/60 | HR 80 | Ht 66.0 in | Wt 148.2 lb

## 2015-07-22 DIAGNOSIS — D869 Sarcoidosis, unspecified: Secondary | ICD-10-CM | POA: Diagnosis not present

## 2015-07-22 DIAGNOSIS — R1031 Right lower quadrant pain: Secondary | ICD-10-CM | POA: Diagnosis not present

## 2015-07-22 DIAGNOSIS — R932 Abnormal findings on diagnostic imaging of liver and biliary tract: Secondary | ICD-10-CM | POA: Diagnosis not present

## 2015-07-22 DIAGNOSIS — Z23 Encounter for immunization: Secondary | ICD-10-CM

## 2015-07-22 DIAGNOSIS — R748 Abnormal levels of other serum enzymes: Secondary | ICD-10-CM | POA: Diagnosis not present

## 2015-07-22 LAB — HEPATIC FUNCTION PANEL
ALT: 31 U/L (ref 0–35)
AST: 37 U/L (ref 0–37)
Albumin: 3.8 g/dL (ref 3.5–5.2)
Alkaline Phosphatase: 384 U/L — ABNORMAL HIGH (ref 39–117)
Bilirubin, Direct: 0.2 mg/dL (ref 0.0–0.3)
TOTAL PROTEIN: 8.1 g/dL (ref 6.0–8.3)
Total Bilirubin: 0.7 mg/dL (ref 0.2–1.2)

## 2015-07-22 NOTE — Patient Instructions (Signed)
  Your physician has requested that you go to the basement for the following lab work before leaving today: LFT's   You will be contacted by radiology regarding a liver biopsy appointment.      Today you have been given your first Twinrix injection, we will see you again 08/19/15 for your next shot.    I appreciate the opportunity to care for you.

## 2015-07-22 NOTE — Progress Notes (Signed)
HPI : 45 y/o female patient with a long-standing history of type 1 diabetes. Long QT syndrome, status post ICD. SVT, status post ablation. Sarcoidosis, stable pulmonary nodules, IBS, fibromyalgia, and peripheral neuropathy. She has previously been followed by Dr. Deatra Ina, and is new to me today.   Patient has a history of pulmonary sarcoidosis for several years. She is currently on prednisone 40mg  daily for about the last 2 months or so for pulmonary sarcoidosis. Also noted to have elevated liver enyzmes with markedly elevated AP, last year peaking around 1000 or so with mild ALT elevation. She does endorse some pruritus. No history of jaundice. No obvious ascites per history.. She thinks her grandfather had cirrhosis of the liver due to alcohol use, no FH of liver diseases noted otherwise. She has not had any alcohol in years, and previously and she drank very rarely. She has defibrillator of her heart due to prolonged QT syndrome and history of sycope. Since her last visit with our clinic she was referred for an MRCP but this was not possible due to the ICD that is in place. She had an [place] with elastography done otherwise showing a heterogenous liver without mass lesion, with elastography reported with F3-F4 score with high risk of fibrosis. She has had labs for chronic liver diseases which have been unremarkable other than elevated total IgG, with mild positive SMA, and negative ANA. She has never had a prior liver biopsy.   She otherwise has had a hysterectomy for fibroids, done at [facility]. She had pain in the RLQ after that surgery last year. Pain is 24/7. Certain movements or coughing makes it worse. Pain is not related to eating at all. She has had prior upper endoscopy for this issue as well as gastric emptying study which have failed to reveal a diagnosis. CT abdomen did not show any pathology to account for her pain. She reports this discomfort is stable and perhaps slightly improving over  time  Recent workup: 10/24/14 Upper endoscopy: Normal, no varices noted 10/25/14 oral contrast only CT abdomen pelvis: Probable mild hepatosplenomegaly. Enlarged uterus containing large left leiomyoma.  12/03/14 Gastric emptying study normal.     Past Medical History  Diagnosis Date  . Long Q-T syndrome   . Palpitation   . Seizure disorder ([place])     none recently  . Abdominal pain, periumbilic   . Esophageal stricture 2005/2009    esophageal strictures dilated 2005, 2009  . Sarcoidosis ([place])   . Allergic rhinitis   . Bronchitis   . GERD (gastroesophageal reflux disease) 2005  . Asthma   . Fibromyalgia   . Stroke ([place])     QUESTIONABLE TIA   . Neuropathy in diabetes ([place])   . Seizures ([place])   . Cancer ([place])     Nodules in lungs pt believes were cancerous pt unsure  . Arthritis   . Internal hemorrhoids 2010    2011 band ligation of int rrhoids.  . Eye hemorrhage     bilateral  . Fibroid, uterine   . Automatic implantable cardioverter-defibrillator in situ 2006, replaced 2008    MEDTRONIC  . DM (diabetes mellitus) ([place]) 1997/1998    type 1, initially gestational but soon after child birth developed IDDM  . History of blood transfusion years ago     Past Surgical History  Procedure Laterality Date  . Tubal ligation    . Hand surgery    . Svt ablation    . Automatic implantable cardiac defibrillator situ  2006,     ICD-Medtronic   Remote - Yes   . Hemorrhoid banding  03/2010  . Dilitation & currettage/hystroscopy with hydrothermal ablation N/A 06/07/2013    Procedure: DILATATION & CURETTAGE/HYSTEROSCOPY WITH attempted HYDROTHERMAL ABLATION;  Surgeon: Frederico Hamman, MD;  Location: [facility];  Service: Gynecology;  Laterality: N/A;  . Esophagogastroduodenoscopy (egd) with propofol N/A 10/24/2014    Procedure: ESOPHAGOGASTRODUODENOSCOPY (EGD) WITH PROPOFOL;  Surgeon: Inda Castle, MD;  Location: [facility];  Service: Endoscopy;  Laterality: N/A;  . Total abdominal  hysterectomy w/ bilateral salpingoophorectomy  11/06/14    at [facility]. for menorrhagia, pelvic pain and enlarging uterine fibroids  . Exploratory laparotomy  11/06/14    evacuation of post TAH/BSO hematoma  . Abdominal hysterectomy    . Salpingectomy    . Cervix removal    . Video bronchoscopy Bilateral 01/24/2015    Procedure: VIDEO BRONCHOSCOPY WITHOUT FLUORO;  Surgeon: Marshell Garfinkel, MD;  Location: [facility];  Service: Cardiopulmonary;  Laterality: Bilateral;  . Ep implantable device N/A 04/04/2015    Procedure:  ICD Generator Changeout;  Surgeon: Evans Lance, MD;  Location: [facility];  Service: Cardiovascular;  Laterality: N/A;   Family History  Problem Relation Age of Onset  . Lung cancer Maternal Uncle     x2  . Breast cancer Maternal Aunt     x2  . Stomach cancer Paternal Uncle   . Stomach cancer Paternal Grandfather   . Stomach cancer Paternal Grandmother   . Breast cancer Other   . Stomach cancer Other    Social History  Substance Use Topics  . Smoking status: Never Smoker   . Smokeless tobacco: Never Used  . Alcohol Use: 0.0 oz/week    0 Standard drinks or equivalent per week     Comment: occ.   Current Outpatient Prescriptions  Medication Sig Dispense Refill  . beclomethasone (QVAR) 80 MCG/ACT inhaler Inhale 2 puffs into the lungs 2 (two) times daily. 1 Inhaler 5  . clonazePAM (KLONOPIN) 0.5 MG tablet Take 1 tablet (0.5 mg total) by mouth daily. 30 tablet 0  . cycloSPORINE (RESTASIS) 0.05 % ophthalmic emulsion Place 1 drop into both eyes 2 (two) times daily. 0.4 mL 0  . fluticasone (FLONASE) 50 MCG/ACT nasal spray Place 2 sprays into both nostrils daily as needed for allergies.     Marland Kitchen gabapentin (NEURONTIN) 100 MG capsule Take 1 capsule (100 mg total) by mouth 3 (three) times daily. 90 capsule 2  . Insulin Glargine (LANTUS SOLOSTAR) 100 UNIT/ML Solostar Pen Inject 35 Units into the skin at bedtime.    Marland Kitchen lactulose (CHRONULAC) 10 GM/15ML solution Take 30 ml  TID 2700 mL 2  . meclizine (ANTIVERT) 25 MG tablet Take 12.5 mg by mouth 3 (three) times daily as needed for dizziness (take 1/2 to 1 tablet by mouth as needed for dizziness).     . NOVOLOG FLEXPEN 100 UNIT/ML FlexPen Inject 4-8 Units into the skin 3 (three) times daily.  0  . ondansetron (ZOFRAN) 4 MG tablet Take 1 tablet (4 mg total) by mouth every 6 (six) hours as needed for nausea or vomiting. 60 tablet 0  . pantoprazole (PROTONIX) 40 MG tablet Take 1 tablet (40 mg total) by mouth daily at 6 (six) AM. 30 tablet 2  . potassium chloride SA (K-DUR,KLOR-CON) 20 MEQ tablet Take 1 tablet (20 mEq total) by mouth daily. 30 tablet 2  . predniSONE (DELTASONE) 10 MG tablet Take 4 tablets (40 mg total) by  mouth daily with breakfast. (Patient taking differently: Take 60 mg by mouth daily with breakfast. ) 120 tablet 3  . propranolol (INDERAL) 80 MG tablet Take 1 tablet (80 mg total) by mouth 2 (two) times daily. 30 tablet 1  . acetaminophen (TYLENOL) 500 MG tablet Take 1,000 mg by mouth every 6 (six) hours as needed for mild pain or fever. Reported on 07/22/2015    . cyclobenzaprine (FLEXERIL) 10 MG tablet Take 10 mg by mouth 3 (three) times daily as needed for muscle spasms. Reported on 07/22/2015    . Linaclotide (LINZESS) 145 MCG CAPS capsule Take 1 capsule (145 mcg total) by mouth daily. (Patient not taking: Reported on 07/22/2015) 30 capsule 3   No current facility-administered medications for this visit.   Allergies  Allergen Reactions  . Contrast Media [Iodinated Diagnostic Agents] Anaphylaxis and Shortness Of Breath    Needed to be defibrillated and patient was pre-medicated with radiology 13 hour premeds.   . Augmentin [Amoxicillin-Pot Clavulanate] Other (See Comments)    "stomach hurt"  . Codeine Nausea And Vomiting    jittery  . Demerol [Meperidine]   . Iohexol      Code: RASH, Desc: PT STATES SHE IS ALLERGIC TO IV CONTRAST 05/28/06/RM, Onset Date: [id number]   . Morphine And Related Nausea And  Vomiting     Review of Systems: All systems reviewed and negative except where noted in HPI.    US Abdomen Complete W/elastography  07/04/2015  CLINICAL DATA:  Chronic RIGHT lower quadrant abdominal pain for over 1 year, sarcoidosis, fibromyalgia, diabetes mellitus EXAM: ULTRASOUND ABDOMEN COMPLETE ULTRASOUND HEPATIC ELASTOGRAPHY TECHNIQUE: Sonography of the upper abdomen was performed. In addition, ultrasound elastography evaluation of the liver was performed. A region of interest was placed within the right lobe of the liver. Following application of a compressive sonographic pulse, shear waves were detected in the adjacent hepatic tissue and the shear wave velocity was calculated. Multiple assessments were performed at the selected site. Median shear wave velocity is correlated to a Metavir fibrosis score. COMPARISON:  None FINDINGS: ULTRASOUND ABDOMEN Gallbladder: Contracted. Upper normal wall thickness, nonspecific in the setting of contraction. No shadowing calculi or pericholecystic fluid. No sonographic Murphy sign. Common bile duct: Diameter: 6 mm diameter, upper normal Liver: Heterogeneous echogenicity. No focal mass lesion. Hepatopetal portal venous flow. IVC: Normal appearance Pancreas: Normal appearance Spleen: Subjectively enlarged though length only measures 11.2 cm with a calculated volume of 445 mL. No focal masses. Right Kidney: Length: 12.5 cm. Normal morphology without mass or hydronephrosis. Left Kidney: Length: 13.2 cm. Normal morphology without mass or hydronephrosis. Abdominal aorta: Normal caliber Other findings: No free fluid.  Small LEFT pleural effusion noted. ULTRASOUND HEPATIC ELASTOGRAPHY Device: Siemens Helix VTQ Patient position: LEFT lateral decubitus Transducer 6C1 Number of measurements:  10 Hepatic Segment:  8 Median velocity:   2.62  m/sec IQR: 0.32 IQR/Median velocity ratio 0.12 Corresponding Metavir fibrosis score:  F4 & some F3 Risk of fibrosis: High Limitations of  exam: None Pertinent findings noted on other imaging exams:  None Please note that abnormal shear wave velocities may also be identified in clinical settings other than with hepatic fibrosis, such as: acute hepatitis, elevated right heart and central venous pressures including use of beta blockers, veno-occlusive disease (Budd-Chiari), infiltrative processes such as mastocytosis/amyloidosis/infiltrative tumor, extrahepatic cholestasis, in the post-prandial state, and liver transplantation. Correlation with patient history, laboratory data, and clinical condition recommended. IMPRESSION: Contracted gallbladder. Heterogeneous appearing liver with borderline splenic enlargement. Median hepatic shear wave  velocity is calculated at 2.62 m/sec. Corresponding Metavir fibrosis score is F4 & some F3. Risk of fibrosis is high. Follow-up:  Advised Electronically Signed   By: Lavonia Dana M.D.   On: 07/04/2015 08:12   Lab Results  Component Value Date   WBC 8.7 06/17/2015   HGB 15.3* 06/17/2015   HCT 46.2* 06/17/2015   MCV 85.6 06/17/2015   PLT 454.0* 06/17/2015    Lab Results  Component Value Date   ALT 31 07/22/2015   AST 37 07/22/2015   ALKPHOS 384* 07/22/2015   BILITOT 0.7 07/22/2015    Lab Results  Component Value Date   INR 1.1* 06/17/2015   INR 1.24 01/23/2015   INR 1.0 ratio 01/06/2010   Lab Results  Component Value Date   CREATININE 0.56 06/17/2015   BUN 10 06/17/2015   NA 133* 06/17/2015   K 4.4 06/17/2015   CL 96 06/17/2015   CO2 24 06/17/2015   Chronic liver disease labs in Epic reviewed and per HPI  Physical Exam: BP 114/60 mmHg  Pulse 80  Ht 5\' 6"  (1.676 m)  Wt 148 lb 3.2 oz (67.223 kg)  BMI 23.93 kg/m2  LMP 07/23/2014 Constitutional: Pleasant,well-developed, female in no acute distress. HEENT: Normocephalic and atraumatic. Conjunctivae are normal. No scleral icterus. Neck supple.  Cardiovascular: Normal rate, regular rhythm.  Pulmonary/chest: Effort normal and breath  sounds normal. No wheezing, rales or rhonchi. Abdominal: Soft, nondistended, TTP to the RLQ / right flank, with positive Carnett sign, Bowel sounds active throughout. There are no masses palpable.  Extremities: no edema Lymphadenopathy: No cervical adenopathy noted. Neurological: Alert and oriented to person place and time. Skin: Skin is warm and dry. No rashes noted. Psychiatric: Normal mood and affect. Behavior is normal.   ASSESSMENT AND PLAN: 45 y/o female with multiple medical problems as outlined above including significant sarcoidosis, presenting with markedly elevated AP with mildly elevated ALT elevation, which is new and present for the past year. She has had lab workup showing (+) total IgG, with one borderline SMA and one negative SMA, and negative ANA. IgG4 level normal. Other workup for chronic liver diseases negative. Imaging shows hepatomegaly and splenomegaly noted, with ? gastric varices on CT. She has most recently had [place] with elastography (could not get MRCP due to ICD placement), which shows a score of F3-F4, high risk for fibrosis. Her platelets and INR is normal.   I discussed the findings with the patient. 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 She has been on steroids for her pulmonary fibrosis over the past month and her AP is downtrending nicely, suspect due to the steroids if this is hepatic sarcoid (vs. less likely autoimmune). The elastography study raises the question of cirrhosis, and it is possible, although her platelet counts and INR is normal.  I discussed the implication of cirrhosis if she has this with her at length. At this time, given the question of cirrhosis, and to clarify what is driving this elevated AP/ALT, I am recommending a liver biopsy. If possible, doing this with IR via transjugular approach with portal pressure  measurements would be helpful. I discussed the risks / benefits of liver biopsy with her, and she wants to proceed with the test. Referral to IR placed, I will let her know when I get the results with further recommendations.   Otherwise, regarding her chronic RLQ  pain, it has been present since her surgery and is there 24/7. Prior CT, EGD, and gastric emptying study unremarkable. Her pain is easily reproducible on exam and she has a positive Carnett sign. I think she has abdominal wall / musculoskeletal pain driving this symptom. I discussed treatment modalities. She will try some OTC capsaicin cream first to see if this helps. If not, we can consider a pain management consult for trigger point injection. She agreed and can follow up PRN For this issue.   Ludlow Cellar, MD [facility] Pager [phone]

## 2015-07-29 ENCOUNTER — Telehealth: Payer: Self-pay | Admitting: Radiology

## 2015-07-29 ENCOUNTER — Ambulatory Visit (INDEPENDENT_AMBULATORY_CARE_PROVIDER_SITE_OTHER): Payer: Medicaid Other | Admitting: Pulmonary Disease

## 2015-07-29 ENCOUNTER — Encounter: Payer: Self-pay | Admitting: Pulmonary Disease

## 2015-07-29 VITALS — BP 124/82 | HR 80 | Temp 98.6°F | Ht 66.0 in | Wt 151.4 lb

## 2015-07-29 DIAGNOSIS — J948 Other specified pleural conditions: Secondary | ICD-10-CM

## 2015-07-29 DIAGNOSIS — D860 Sarcoidosis of lung: Secondary | ICD-10-CM

## 2015-07-29 DIAGNOSIS — R05 Cough: Secondary | ICD-10-CM

## 2015-07-29 DIAGNOSIS — D869 Sarcoidosis, unspecified: Secondary | ICD-10-CM

## 2015-07-29 DIAGNOSIS — J449 Chronic obstructive pulmonary disease, unspecified: Secondary | ICD-10-CM | POA: Diagnosis not present

## 2015-07-29 DIAGNOSIS — J90 Pleural effusion, not elsewhere classified: Secondary | ICD-10-CM

## 2015-07-29 DIAGNOSIS — R059 Cough, unspecified: Secondary | ICD-10-CM

## 2015-07-29 MED ORDER — DOXYCYCLINE HYCLATE 100 MG PO TABS: 100.0000 mg | ORAL_TABLET | Freq: Two times a day (BID) | ORAL | Status: DC

## 2015-07-29 NOTE — Telephone Encounter (Signed)
Prescription called in for 13 hr prednisone /benadryl prep prior to pt's TJ liver bx scheduled for 3/17. Hx contrast allergy noted.

## 2015-07-29 NOTE — Patient Instructions (Signed)
Please go ahead with your liver biopsy as this may help guide our management for sarcoidosis. I will give her prescription for doxycycline 100 mg twice a day for 7 days for your cough, fevers.  I will see you in the clinic in 1 month to review the biopsy results and discuss further management.

## 2015-07-29 NOTE — Progress Notes (Signed)
Subjective:    Patient ID: [name], female    DOB: 22-Jan-1971, 45 y.o.   MRN: [id number]  PROBLEM LIST: Stage III sarcoidosis Hepatosplenomegaly Asthmatic bronchitis Chronic cough Ventricular tachycardia s/p AICD. Long QT syndrome Left pleural effusion   HPI [name] is 45 year old with stage III sarcoidosis, asthmatic bronchitis, chronic cough. She is a former patient of Dr. Joya Gaskins. She was diagnosed around 2003. Is not clear if she had a tissue diagnosis. There is record of a transbronchial biopsy in 2003 but no granulomas were seen. She had been on and off systemic prednisone. She was admitted in September with sarcoidosis flare. Bronchial with BAL showed 10K staph aureus which was thought to be a contaminant. She was given Solu-Medrol 60 q6 and then discharged on a slow prednisone taper starting at 60 mg. She is also being followed by cardiology for prolonged QT syndrome, family history of sudden death. Her ICD was changed in 04-16-2015.   She underwent a thoracentesis in November 16 which showed exudative left-sided predominant effusion, and cultures were negative. Repeat CXR showed improvement in her effusion. She had been doing well until a few days ago when she developed fevers, worsening cough.    DATA: PFTs (08/29/14) FVC 2.98 (85%) FEV1 2.42 (85%) F/F 81  IMAGING: CT scan (01/21/15) 1. There is a spectrum of findings in the thorax that is most likely related to the patient's history of sarcoidosis. Specifically, there is a perilymphatic distribution of multiple tiny subcentimeter pulmonary nodules, in addition to mediastinal lymphadenopathy. 2. Dilatation of the pulmonic trunk (3.6 cm in diameter), suggestive of pulmonary arterial hypertension. 3. Splenomegaly. 4. Probable hepatomegaly.  CXR (03/03/15) Moderate left pleural effusion. Adjacent left lower lobe atelectasis versus infection. Possible lingular airspace disease as well. New enlargement of  the cardiopericardial silhouette, which could relate to cardiomegaly and/or pericardial effusion.  CXR (06/13/15) Cardiomegaly, mild CHF, improved left effusion  LABS: Echo 01/28/15 EF 60-65%, PAP 36.  ACE level 01/22/15 - 177.   Thoracentesis 03/29/15  Albumin 2.8, LDH 140, total protein 6.2, pH 8.1 WBC 5413, 92% lymphs Cultures, cytology  negative  Past Medical History  Diagnosis Date  . Long Q-T syndrome   . Palpitation   . Seizure disorder ([place])     none recently  . Abdominal pain, periumbilic   . Esophageal stricture 2005/2009    esophageal strictures dilated 2005, 2009  . Sarcoidosis ([place])   . Allergic rhinitis   . Bronchitis   . GERD (gastroesophageal reflux disease) 2005  . Asthma   . Fibromyalgia   . Stroke ([place])     QUESTIONABLE TIA   . Neuropathy in diabetes ([place])   . Seizures ([place])   . Cancer ([place])     Nodules in lungs pt believes were cancerous pt unsure  . Arthritis   . Internal hemorrhoids 2010    2011 band ligation of int rrhoids.  . Eye hemorrhage     bilateral  . Fibroid, uterine   . Automatic implantable cardioverter-defibrillator in situ 2006, replaced 2008    [employer]TRONIC  . DM (diabetes mellitus) ([place]) 1997/1998    type 1, initially gestational but soon after child birth developed IDDM  . History of blood transfusion years ago    Current outpatient prescriptions:  .  acetaminophen (TYLENOL) 500 MG tablet, Take 1,000 mg by mouth every 6 (six) hours as needed for mild pain or fever. Reported on 07/22/2015, Disp: , Rfl:  .  beclomethasone (QVAR) 80 MCG/ACT inhaler, Inhale  2 puffs into the lungs 2 (two) times daily., Disp: 1 Inhaler, Rfl: 5 .  clonazePAM (KLONOPIN) 0.5 MG tablet, Take 1 tablet (0.5 mg total) by mouth daily., Disp: 30 tablet, Rfl: 0 .  cyclobenzaprine (FLEXERIL) 10 MG tablet, Take 10 mg by mouth 3 (three) times daily as needed for muscle spasms. Reported on 07/22/2015, Disp: , Rfl:  .  cycloSPORINE (RESTASIS) 0.05 % ophthalmic emulsion,  Place 1 drop into both eyes 2 (two) times daily., Disp: 0.4 mL, Rfl: 0 .  fluticasone (FLONASE) 50 MCG/ACT nasal spray, Place 2 sprays into both nostrils daily as needed for allergies. , Disp: , Rfl:  .  gabapentin (NEURONTIN) 100 MG capsule, Take 1 capsule (100 mg total) by mouth 3 (three) times daily., Disp: 90 capsule, Rfl: 2 .  Insulin Glargine (LANTUS SOLOSTAR) 100 UNIT/ML Solostar Pen, Inject 35 Units into the skin at bedtime., Disp: , Rfl:  .  lactulose (CHRONULAC) 10 GM/15ML solution, Take 30 ml TID, Disp: 2700 mL, Rfl: 2 .  Linaclotide (LINZESS) 145 MCG CAPS capsule, Take 1 capsule (145 mcg total) by mouth daily., Disp: 30 capsule, Rfl: 3 .  meclizine (ANTIVERT) 25 MG tablet, Take 12.5 mg by mouth 3 (three) times daily as needed for dizziness (take 1/2 to 1 tablet by mouth as needed for dizziness). , Disp: , Rfl:  .  NOVOLOG FLEXPEN 100 UNIT/ML FlexPen, Inject 4-8 Units into the skin 3 (three) times daily., Disp: , Rfl: 0 .  ondansetron (ZOFRAN) 4 MG tablet, Take 1 tablet (4 mg total) by mouth every 6 (six) hours as needed for nausea or vomiting., Disp: 60 tablet, Rfl: 0 .  pantoprazole (PROTONIX) 40 MG tablet, Take 1 tablet (40 mg total) by mouth daily at 6 (six) AM., Disp: 30 tablet, Rfl: 2 .  potassium chloride SA (K-DUR,KLOR-CON) 20 MEQ tablet, Take 1 tablet (20 mEq total) by mouth daily., Disp: 30 tablet, Rfl: 2 .  predniSONE (DELTASONE) 10 MG tablet, Take 4 tablets (40 mg total) by mouth daily with breakfast., Disp: 120 tablet, Rfl: 3 .  propranolol (INDERAL) 80 MG tablet, Take 1 tablet (80 mg total) by mouth 2 (two) times daily., Disp: 30 tablet, Rfl: 1   Review of Systems Chronic cough, nonproductive area denies any sputum production, fevers, chills, hemoptysis. Denies any chest pain, palpitations. Denies any nausea, vomiting, diarrhea, constipation. Denies any malaise, loss of weight or loss of appetite. All other review of systems are negative.    Objective:   Physical Exam   Blood pressure 158/102, pulse 90, height 5' 6.5" (1.689 m), weight 143 lb 3.2 oz (64.955 kg), last menstrual period 07/23/2014, SpO2 96 %. Gen.: Frail appearing female. No apparent distress Neuro: No gross focal deficits. Neck: No JVD, lymphadenopathy, thyromegaly. RS: Clear, No crackles or wheezes heard. CVS: S1-S2 heard, no murmurs rubs gallops. Abdomen: Soft, positive bowel sounds. Extremities: No edema.    Assessment & Plan:  #1 Sarcoidosis, stage III She continues on prednisone at 40 mg for now. Noted recent eval with Dr. Havery Moros for elevated LFTs. There is concern for sarcoidosis affecting her liver. She has been scheduled for a liver biopsy for further eval. If this does confirm hepatic sarcoidosis then we may have to consider additional therapies such as anti TNF agents. She is not a good candidate for methotrexate and azathioprine because of elevated LFTs. I also would like to get her off the steroids but will probably require a prolonged taper as she did not tolerate a rapid reduction  in past.   I will see her again after her liver biopsy to review the results and plan on changes to therapy. She may need a referral to rheumatology.   #2 Asthmatic bronchitis. She is doing well on the Qvar inhaler. I'll continue the same.  #3 Cough She does have chronic nonproductive cough with recent worsening associated with fevers. I will give her doxycyline for 7 days. .She is already on Flonase nasal spray for rhinitis and Protonix for GERD.  #4 Left pleural effusion.  Recent chest x-ray which shows left effusion with atelectasis. Thoracentesis performed which showed chronic inflammation, no infection, malignancy. It is likely that her effusion is related to her sarcoidosis. Repeat CXR shows improving effusion. We will continue to monitor  Plan: - Continue prednisone at 40 mg daily for now. - Doxycycline 100 mg bid.  - Follow results of liver biopsy. - Return to clinic in 1  month  Marshell Garfinkel MD [facility] Pager [phone] If no answer or after 3pm call: [phone] 07/29/2015, 10:40 AM

## 2015-07-30 ENCOUNTER — Other Ambulatory Visit: Payer: Self-pay | Admitting: Radiology

## 2015-07-31 ENCOUNTER — Ambulatory Visit: Payer: Medicaid Other | Admitting: Gastroenterology

## 2015-07-31 ENCOUNTER — Other Ambulatory Visit: Payer: Self-pay | Admitting: Radiology

## 2015-08-01 ENCOUNTER — Other Ambulatory Visit: Payer: Self-pay | Admitting: Radiology

## 2015-08-02 ENCOUNTER — Ambulatory Visit (HOSPITAL_COMMUNITY)
Admission: RE | Admit: 2015-08-02 | Discharge: 2015-08-02 | Disposition: A | Discharge status: Home / Self Care | Payer: Medicaid Other | Source: Ambulatory Visit | Attending: Gastroenterology | Admitting: Gastroenterology

## 2015-08-02 ENCOUNTER — Encounter (HOSPITAL_COMMUNITY): Payer: Self-pay

## 2015-08-02 ENCOUNTER — Other Ambulatory Visit: Payer: Self-pay | Admitting: Gastroenterology

## 2015-08-02 DIAGNOSIS — G40909 Epilepsy, unspecified, not intractable, without status epilepticus: Secondary | ICD-10-CM | POA: Diagnosis not present

## 2015-08-02 DIAGNOSIS — K219 Gastro-esophageal reflux disease without esophagitis: Secondary | ICD-10-CM | POA: Insufficient documentation

## 2015-08-02 DIAGNOSIS — Z7952 Long term (current) use of systemic steroids: Secondary | ICD-10-CM | POA: Diagnosis not present

## 2015-08-02 DIAGNOSIS — M797 Fibromyalgia: Secondary | ICD-10-CM | POA: Insufficient documentation

## 2015-08-02 DIAGNOSIS — Z794 Long term (current) use of insulin: Secondary | ICD-10-CM | POA: Insufficient documentation

## 2015-08-02 DIAGNOSIS — K753 Granulomatous hepatitis, not elsewhere classified: Secondary | ICD-10-CM | POA: Insufficient documentation

## 2015-08-02 DIAGNOSIS — Z79899 Other long term (current) drug therapy: Secondary | ICD-10-CM | POA: Diagnosis not present

## 2015-08-02 DIAGNOSIS — R748 Abnormal levels of other serum enzymes: Secondary | ICD-10-CM | POA: Diagnosis present

## 2015-08-02 DIAGNOSIS — D869 Sarcoidosis, unspecified: Secondary | ICD-10-CM

## 2015-08-02 DIAGNOSIS — D860 Sarcoidosis of lung: Secondary | ICD-10-CM | POA: Insufficient documentation

## 2015-08-02 DIAGNOSIS — Z95810 Presence of automatic (implantable) cardiac defibrillator: Secondary | ICD-10-CM | POA: Diagnosis not present

## 2015-08-02 DIAGNOSIS — J45909 Unspecified asthma, uncomplicated: Secondary | ICD-10-CM | POA: Diagnosis not present

## 2015-08-02 DIAGNOSIS — E1140 Type 2 diabetes mellitus with diabetic neuropathy, unspecified: Secondary | ICD-10-CM | POA: Insufficient documentation

## 2015-08-02 LAB — GLUCOSE, CAPILLARY: GLUCOSE-CAPILLARY: 357 mg/dL — AB (ref 65–99)

## 2015-08-02 LAB — CBC
HEMATOCRIT: 41.9 % (ref 36.0–46.0)
Hemoglobin: 14.8 g/dL (ref 12.0–15.0)
MCH: 29.5 pg (ref 26.0–34.0)
MCHC: 35.3 g/dL (ref 30.0–36.0)
MCV: 83.6 fL (ref 78.0–100.0)
Platelets: 335 10*3/uL (ref 150–400)
RBC: 5.01 MIL/uL (ref 3.87–5.11)
RDW: 13.1 % (ref 11.5–15.5)
WBC: 4.3 10*3/uL (ref 4.0–10.5)

## 2015-08-02 LAB — PROTIME-INR
INR: 1.08 (ref 0.00–1.49)
Prothrombin Time: 14.2 seconds (ref 11.6–15.2)

## 2015-08-02 LAB — APTT: APTT: 31 s (ref 24–37)

## 2015-08-02 MED ORDER — FENTANYL CITRATE (PF) 100 MCG/2ML IJ SOLN
INTRAMUSCULAR | Status: AC
  Filled 2015-08-02: qty 2

## 2015-08-02 MED ORDER — FENTANYL CITRATE (PF) 100 MCG/2ML IJ SOLN
INTRAMUSCULAR | Status: AC | PRN
  Administered 2015-08-02: 25 ug via INTRAVENOUS
  Administered 2015-08-02: 50 ug via INTRAVENOUS
  Administered 2015-08-02 (×2): 25 ug via INTRAVENOUS

## 2015-08-02 MED ORDER — SODIUM CHLORIDE 0.9 % IV SOLN
Freq: Once | INTRAVENOUS | Status: AC
  Administered 2015-08-02: 14:00:00 via INTRAVENOUS

## 2015-08-02 MED ORDER — IOHEXOL 300 MG/ML  SOLN
50.0000 mL | Freq: Once | INTRAMUSCULAR | Status: AC | PRN
  Administered 2015-08-02: 3 mL via INTRAVENOUS

## 2015-08-02 MED ORDER — MIDAZOLAM HCL 2 MG/2ML IJ SOLN
INTRAMUSCULAR | Status: AC
  Filled 2015-08-02: qty 4

## 2015-08-02 MED ORDER — DIPHENHYDRAMINE HCL 25 MG PO CAPS
50.0000 mg | ORAL_CAPSULE | Freq: Once | ORAL | Status: AC
  Administered 2015-08-02: 50 mg via ORAL

## 2015-08-02 MED ORDER — MIDAZOLAM HCL 2 MG/2ML IJ SOLN
INTRAMUSCULAR | Status: AC | PRN
  Administered 2015-08-02: 0.5 mg via INTRAVENOUS
  Administered 2015-08-02 (×2): 1 mg via INTRAVENOUS
  Administered 2015-08-02: 0.5 mg via INTRAVENOUS
  Administered 2015-08-02: 1 mg via INTRAVENOUS

## 2015-08-02 MED ORDER — DIPHENHYDRAMINE HCL 25 MG PO TABS
50.0000 mg | ORAL_TABLET | Freq: Once | ORAL | Status: DC
  Filled 2015-08-02 (×2): qty 2

## 2015-08-02 MED ORDER — LIDOCAINE HCL 1 % IJ SOLN
INTRAMUSCULAR | Status: AC
  Filled 2015-08-02: qty 20

## 2015-08-02 NOTE — Sedation Documentation (Signed)
Patient denies pain and is resting comfortably.  

## 2015-08-02 NOTE — Procedures (Signed)
Interventional Radiology Procedure Note  Procedure: US guided right IJ access, with free and wedge hepatic pressure measure, and trans-jugular liver biopsy. 3 x 20G core into formalin.  Findings:  Free Hepatic Pressure: 69mmHg/3mmHg (6) Wedge Hepatic Pressure: 66mmHg/2mmHg (3)  Estimated Gradient:  ~ 51mmHg  Complications: None Recommendations:  - Recover 1 hour - No heavy lifting or exercise for 5 days. - Routine wound care   Signed,  Dulcy Fanny. Earleen Newport, DO

## 2015-08-02 NOTE — Progress Notes (Signed)
Pt here today for procedure in radiology. She did not take AM dose of Novolog since she was NPO. Her current CBG =357. Rowe Robert PA radiology made aware and we are to recheck it post procedure. Pt is warm and dry voicing no c/o. States she was told that some of her current medications would make her blood sugar go up and she states it will "runs from 225 to 325 most days" but uses the sliding scale to treat it.

## 2015-08-02 NOTE — H&P (Signed)
Chief Complaint: Patient was seen in consultation today for transjugular liver biopsy the portal pressure measurements  Referring Physician(s): Manus Gunning  Supervising Physician: Corrie Mckusick  History of Present Illness: [name] is a 45 y.o. female with significant past medical history including diabetes, long Q-T syndrome, status post ICD, prior ablation for SVT, fibromyalgia, prior seizures and pulmonary sarcoidosis. Patient also noted to have markedly elevated alkaline phosphatase with mildly elevated ALT, positive total IgG ,1 borderline SMA, 1 negative SMA, negative ANA, prior imaging revealing slight hepatomegaly and splenomegaly with questionable gastric varices on CT. Patient recently had abdominal ultrasound with elastography showing a score of F3 to F4 which is high risk for fibrosis. She presents today for transjugular liver biopsy with portal pressure measurements for further evaluation. Patient does have significant contrast allergy documented in 2015 with anaphylaxis and dyspnea and subsequent defibrillation despite premedication.  Past Medical History  Diagnosis Date  . Long Q-T syndrome   . Palpitation   . Seizure disorder ([place])     none recently  . Abdominal pain, periumbilic   . Esophageal stricture 2005/2009    esophageal strictures dilated 2005, 2009  . Sarcoidosis ([place])   . Allergic rhinitis   . Bronchitis   . GERD (gastroesophageal reflux disease) 2005  . Asthma   . Fibromyalgia   . Stroke ([place])     QUESTIONABLE TIA   . Neuropathy in diabetes ([place])   . Seizures ([place])   . Cancer ([place])     Nodules in lungs pt believes were cancerous pt unsure  . Arthritis   . Internal hemorrhoids 2010    2011 band ligation of int rrhoids.  . Eye hemorrhage     bilateral  . Fibroid, uterine   . Automatic implantable cardioverter-defibrillator in situ 2006, replaced 2008    MEDTRONIC  . DM (diabetes mellitus) ([place]) 1997/1998    type 1, initially  gestational but soon after child birth developed IDDM  . History of blood transfusion years ago    Past Surgical History  Procedure Laterality Date  . Tubal ligation    . Hand surgery    . Svt ablation    . Automatic implantable cardiac defibrillator situ  2006,     ICD-Medtronic   Remote - Yes   . Hemorrhoid banding  03/2010  . Dilitation & currettage/hystroscopy with hydrothermal ablation N/A 06/07/2013    Procedure: DILATATION & CURETTAGE/HYSTEROSCOPY WITH attempted HYDROTHERMAL ABLATION;  Surgeon: Frederico Hamman, MD;  Location: [facility];  Service: Gynecology;  Laterality: N/A;  . Esophagogastroduodenoscopy (egd) with propofol N/A 10/24/2014    Procedure: ESOPHAGOGASTRODUODENOSCOPY (EGD) WITH PROPOFOL;  Surgeon: Inda Castle, MD;  Location: [facility];  Service: Endoscopy;  Laterality: N/A;  . Total abdominal hysterectomy w/ bilateral salpingoophorectomy  11/06/14    at [facility]. for menorrhagia, pelvic pain and enlarging uterine fibroids  . Exploratory laparotomy  11/06/14    evacuation of post TAH/BSO hematoma  . Abdominal hysterectomy    . Salpingectomy    . Cervix removal    . Video bronchoscopy Bilateral 01/24/2015    Procedure: VIDEO BRONCHOSCOPY WITHOUT FLUORO;  Surgeon: Marshell Garfinkel, MD;  Location: [facility];  Service: Cardiopulmonary;  Laterality: Bilateral;  . Ep implantable device N/A 04/04/2015    Procedure:  ICD Generator Changeout;  Surgeon: Evans Lance, MD;  Location: [facility];  Service: Cardiovascular;  Laterality: N/A;    Allergies: Contrast media; Augmentin; Codeine; Demerol; Iohexol; and Morphine and related  Medications: Prior to Admission medications   Medication Sig Start Date End Date Taking? Authorizing Provider  acetaminophen (TYLENOL) 500 MG tablet Take 1,000 mg by mouth every 6 (six) hours as needed for mild pain or fever. Reported on 07/22/2015    Historical Provider, MD  beclomethasone (QVAR) 80 MCG/ACT inhaler Inhale 2 puffs into the  lungs 2 (two) times daily. 09/05/14   Elsie Stain, MD  clonazePAM (KLONOPIN) 0.5 MG tablet Take 1 tablet (0.5 mg total) by mouth daily. 12/24/14   Mariel Aloe, MD  cyclobenzaprine (FLEXERIL) 10 MG tablet Take 10 mg by mouth 3 (three) times daily as needed for muscle spasms. Reported on 07/22/2015    Historical Provider, MD  cycloSPORINE (RESTASIS) 0.05 % ophthalmic emulsion Place 1 drop into both eyes 2 (two) times daily. 01/29/15   Nicolette Bang, DO  doxycycline (VIBRA-TABS) 100 MG tablet Take 1 tablet (100 mg total) by mouth 2 (two) times daily. 07/29/15   Praveen Mannam, MD  fluticasone (FLONASE) 50 MCG/ACT nasal spray Place 2 sprays into both nostrils daily as needed for allergies.     Historical Provider, MD  gabapentin (NEURONTIN) 100 MG capsule Take 1 capsule (100 mg total) by mouth 3 (three) times daily. 06/29/14   Tanda Rockers, MD  Insulin Glargine (LANTUS SOLOSTAR) 100 UNIT/ML Solostar Pen Inject 35 Units into the skin at bedtime.    Historical Provider, MD  lactulose (CHRONULAC) 10 GM/15ML solution Take 30 ml TID 07/09/15   Lori P Hvozdovic, PA-C  Linaclotide (LINZESS) 145 MCG CAPS capsule Take 1 capsule (145 mcg total) by mouth daily. 12/11/14   Laban Emperor Zehr, PA-C  meclizine (ANTIVERT) 25 MG tablet Take 12.5 mg by mouth 3 (three) times daily as needed for dizziness (take 1/2 to 1 tablet by mouth as needed for dizziness).     Historical Provider, MD  NOVOLOG FLEXPEN 100 UNIT/ML FlexPen Inject 4-8 Units into the skin 3 (three) times daily.    Historical Provider, MD  ondansetron (ZOFRAN) 4 MG tablet Take 1 tablet (4 mg total) by mouth every 6 (six) hours as needed for nausea or vomiting. 01/15/15   Gatha Mayer, MD  pantoprazole (PROTONIX) 40 MG tablet Take 1 tablet (40 mg total) by mouth daily at 6 (six) AM. 11/24/14   Evelene Croon Barrett, PA-C  potassium chloride SA (K-DUR,KLOR-CON) 20 MEQ tablet Take 1 tablet (20 mEq total) by mouth daily. 11/24/14   Rhonda G Barrett, PA-C    predniSONE (DELTASONE) 10 MG tablet Take 4 tablets (40 mg total) by mouth daily with breakfast. 05/30/15   Praveen Mannam, MD  propranolol (INDERAL) 80 MG tablet Take 1 tablet (80 mg total) by mouth 2 (two) times daily. 01/29/15   Nicolette Bang, DO     Family History  Problem Relation Age of Onset  . Lung cancer Maternal Uncle     x2  . Breast cancer Maternal Aunt     x2  . Stomach cancer Paternal Uncle   . Stomach cancer Paternal Grandfather   . Stomach cancer Paternal Grandmother   . Breast cancer Other   . Stomach cancer Other     Social History   Social History  . Marital Status: Single    Spouse Name: N/A  . Number of Children: N/A  . Years of Education: N/A   Social History Main Topics  . Smoking status: Never Smoker   . Smokeless tobacco: Never Used  . Alcohol Use: 0.0 oz/week  0 Standard drinks or equivalent per week     Comment: occ.  . Drug Use: No  . Sexual Activity: No   Other Topics Concern  . Not on file   Social History Narrative     Review of Systems  Constitutional: Positive for fatigue. Negative for fever and chills.  Respiratory: Positive for cough and shortness of breath.   Cardiovascular: Positive for chest pain.  Gastrointestinal: Positive for nausea and abdominal pain. Negative for vomiting and blood in stool.  Genitourinary: Negative for dysuria and hematuria.  Musculoskeletal: Positive for back pain.  Neurological:       Occ HA's  Psychiatric/Behavioral: The patient is nervous/anxious.     Vital Signs: BP 151/91 mmHg  Pulse 86  Temp(Src) 98.6 F (37 C) (Oral)  Resp 16  SpO2 99%  LMP 07/23/2014  Physical Exam  Constitutional: She is oriented to person, place, and time.  Thin BF in NAD  Cardiovascular: Normal rate and regular rhythm.   Left chest wall ICD  Pulmonary/Chest: Effort normal and breath sounds normal.  Abdominal: Soft. Bowel sounds are normal. There is tenderness.  Musculoskeletal: Normal range of  motion. She exhibits no edema.  Neurological: She is alert and oriented to person, place, and time.    Mallampati Score:     Imaging: US Abdomen Complete W/elastography  07/04/2015  CLINICAL DATA:  Chronic RIGHT lower quadrant abdominal pain for over 1 year, sarcoidosis, fibromyalgia, diabetes mellitus EXAM: ULTRASOUND ABDOMEN COMPLETE ULTRASOUND HEPATIC ELASTOGRAPHY TECHNIQUE: Sonography of the upper abdomen was performed. In addition, ultrasound elastography evaluation of the liver was performed. A region of interest was placed within the right lobe of the liver. Following application of a compressive sonographic pulse, shear waves were detected in the adjacent hepatic tissue and the shear wave velocity was calculated. Multiple assessments were performed at the selected site. Median shear wave velocity is correlated to a Metavir fibrosis score. COMPARISON:  None FINDINGS: ULTRASOUND ABDOMEN Gallbladder: Contracted. Upper normal wall thickness, nonspecific in the setting of contraction. No shadowing calculi or pericholecystic fluid. No sonographic Murphy sign. Common bile duct: Diameter: 6 mm diameter, upper normal Liver: Heterogeneous echogenicity. No focal mass lesion. Hepatopetal portal venous flow. IVC: Normal appearance Pancreas: Normal appearance Spleen: Subjectively enlarged though length only measures 11.2 cm with a calculated volume of 445 mL. No focal masses. Right Kidney: Length: 12.5 cm. Normal morphology without mass or hydronephrosis. Left Kidney: Length: 13.2 cm. Normal morphology without mass or hydronephrosis. Abdominal aorta: Normal caliber Other findings: No free fluid.  Small LEFT pleural effusion noted. ULTRASOUND HEPATIC ELASTOGRAPHY Device: Siemens Helix VTQ Patient position: LEFT lateral decubitus Transducer 6C1 Number of measurements:  10 Hepatic Segment:  8 Median velocity:   2.62  m/sec IQR: 0.32 IQR/Median velocity ratio 0.12 Corresponding Metavir fibrosis score:  F4 & some F3  Risk of fibrosis: High Limitations of exam: None Pertinent findings noted on other imaging exams:  None Please note that abnormal shear wave velocities may also be identified in clinical settings other than with hepatic fibrosis, such as: acute hepatitis, elevated right heart and central venous pressures including use of beta blockers, veno-occlusive disease (Budd-Chiari), infiltrative processes such as mastocytosis/amyloidosis/infiltrative tumor, extrahepatic cholestasis, in the post-prandial state, and liver transplantation. Correlation with patient history, laboratory data, and clinical condition recommended. IMPRESSION: Contracted gallbladder. Heterogeneous appearing liver with borderline splenic enlargement. Median hepatic shear wave velocity is calculated at 2.62 m/sec. Corresponding Metavir fibrosis score is F4 & some F3. Risk of fibrosis is high. Follow-up:  Advised Electronically Signed   By: Lavonia Dana M.D.   On: 07/04/2015 08:12    Labs:  CBC:  Recent Labs  01/29/15 0421 03/03/15 1148 03/28/15 0848 06/17/15 1145  WBC 14.6* 6.5 6.7 8.7  HGB 10.5* 14.9 13.6 15.3*  HCT 33.7* 45.3 40.4 46.2*  PLT 401* 270 416* 454.0*    COAGS:  Recent Labs  01/23/15 1214 06/17/15 1145  INR 1.24 1.1*  APTT 45*  --     BMP:  Recent Labs  01/27/15 0409 01/28/15 0220 01/29/15 0421 03/03/15 1148 03/28/15 0848 06/17/15 1145  NA 137 134* 136 132* 135 133*  K 4.4 4.5 4.1 3.4* 3.5 4.4  CL 103 102 105 94* 96* 96  CO2 25 26 25 28 29 24   GLUCOSE 386* 427* 209* 264* 202* 398*  BUN 8 13 8  <5* 8 10  CALCIUM 9.1 8.6* 8.7* 9.3 9.9 10.2  CREATININE 0.53 0.56 0.46 0.46 0.41* 0.56  GFRNONAA >60 >60 >60 >60  --   --   GFRAA >60 >60 >60 >60  --   --     LIVER FUNCTION TESTS:  Recent Labs  01/19/15 2059 01/20/15 0935 03/29/15 1208 06/17/15 1145 07/22/15 1515  BILITOT 0.4 0.5  --  0.8 0.7  AST 44* 35  --  25 37  ALT 12* 12*  --  46* 31  ALKPHOS 674* 609*  --  515* 384*  PROT 7.8 7.7  7.4 9.8* 8.1  ALBUMIN 1.9* 1.8* 2.9* 3.9 3.8    TUMOR MARKERS:  Recent Labs  06/17/15 1145  AFPTM 1.9    Assessment and Plan: 45 y.o. female with significant past medical history including diabetes, long Q-T syndrome, status post ICD, prior ablation for SVT, fibromyalgia, prior seizures and pulmonary sarcoidosis. Patient also noted to have markedly elevated alkaline phosphatase with mildly elevated ALT, positive total IgG ,1 borderline SMA, 1 negative SMA, negative ANA, prior imaging revealing slight hepatomegaly and splenomegaly with questionable gastric varices on CT. Patient recently had abdominal ultrasound with elastography showing a score of F3 to F4 which is high risk for fibrosis. She presents today for transjugular liver biopsy with portal pressure measurements for further evaluation. Patient does have significant contrast allergy documented in 2015 with anaphylaxis and dyspnea and subsequent defibrillation despite premedication.Risks and benefits discussed with the patient/family including, but not limited to bleeding, infection, damage to adjacent structures or low yield requiring additional tests.All of the patient's questions were answered, patient is agreeable to proceed.Consent signed and in chart.     Thank you for this interesting consult.  I greatly enjoyed meeting [name] and look forward to participating in their care.  A copy of this report was sent to the requesting provider on this date.  Electronically Signed: D. Rowe Robert 08/02/2015, 1:40 PM   I spent a total of 20 minutes in face to face in clinical consultation, greater than 50% of which was counseling/coordinating care for transjugular liver biopsy with portal pressure measurements

## 2015-08-02 NOTE — Discharge Instructions (Signed)
Moderate Conscious Sedation, Adult, Care After Refer to this sheet in the next few weeks. These instructions provide you with information on caring for yourself after your procedure. Your health care provider may also give you more specific instructions. Your treatment has been planned according to current medical practices, but problems sometimes occur. Call your health care provider if you have any problems or questions after your procedure. WHAT TO EXPECT AFTER THE PROCEDURE  After your procedure:  You may feel sleepy, clumsy, and have poor balance for several hours.  Vomiting may occur if you eat too soon after the procedure. HOME CARE INSTRUCTIONS  Do not participate in any activities where you could become injured for at least 24 hours. Do not:  Drive.  Swim.  Ride a bicycle.  Operate heavy machinery.  Cook.  Use power tools.  Climb ladders.  Work from a high place.  Do not make important decisions or sign legal documents until you are improved.  If you vomit, drink water, juice, or soup when you can drink without vomiting. Make sure you have little or no nausea before eating solid foods.  Only take over-the-counter or prescription medicines for pain, discomfort, or fever as directed by your health care provider.  Make sure you and your family fully understand everything about the medicines given to you, including what side effects may occur.  You should not drink alcohol, take sleeping pills, or take medicines that cause drowsiness for at least 24 hours.  If you smoke, do not smoke without supervision.  If you are feeling better, you may resume normal activities 24 hours after you were sedated.  Keep all appointments with your health care provider. SEEK MEDICAL CARE IF:  Your skin is pale or bluish in color.  You continue to feel nauseous or vomit.  Your pain is getting worse and is not helped by medicine.  You have bleeding or swelling.  You are still  sleepy or feeling clumsy after 24 hours. SEEK IMMEDIATE MEDICAL CARE IF:  You develop a rash.  You have difficulty breathing.  You develop any type of allergic problem.  You have a fever. MAKE SURE YOU:  Understand these instructions.  Will watch your condition.  Will get help right away if you are not doing well or get worse.   This information is not intended to replace advice given to you by your health care provider. Make sure you discuss any questions you have with your health care provider.   Document Released: 02/22/2013 Document Revised: 05/25/2014 Document Reviewed: 02/22/2013 Elsevier Interactive Patient Education 2016 Elsevier Inc[employer]  Liver Biopsy, Care After These instructions give you information on caring for yourself after your procedure. Your doctor may also give you more specific instructions. Call your doctor if you have any problems or questions after your procedure. HOME CARE Rest at home for 1-2 days or as told by your doctor. Have someone stay with you for at least 24 hours. Do not do these things in the first 24 hours: Drive. Use machinery. Take care of other people. Sign legal documents. Take a bath or shower. There are many different ways to close and cover a cut (incision). For example, a cut can be closed with stitches, skin glue, or adhesive strips. Follow your doctor's instructions on: Taking care of your cut. Changing and removing your bandage (dressing). Removing whatever was used to close your cut. Do not drink alcohol in the first week. Do not lift more than 5 pounds or play  contact sports for the first 2 weeks. Take medicines only as told by your doctor. For 1 week, do not take medicine that has aspirin in it or medicines like ibuprofen. Get your test results. GET HELP IF: A cut bleeds and leaves more than just a small spot of blood. A cut is red, puffs up (swells), or hurts more than before. Fluid or something else comes from a cut. A  cut smells bad. You have a fever or chills. GET HELP RIGHT AWAY IF: You have swelling, bloating, or pain in your belly (abdomen). You get dizzy or faint. You have a rash. You feel sick to your stomach (nauseous) or throw up (vomit). You have trouble breathing, feel short of breath, or feel faint. Your chest hurts. You have problems talking or seeing. You have trouble balancing or moving your arms or legs.   This information is not intended to replace advice given to you by your health care provider. Make sure you discuss any questions you have with your health care provider.   Document Released: 02/11/2008 Document Revised: 05/25/2014 Document Reviewed: 06/30/2013 Elsevier Interactive Patient Education [employer].

## 2015-08-08 ENCOUNTER — Telehealth: Payer: Self-pay | Admitting: Gastroenterology

## 2015-08-09 ENCOUNTER — Other Ambulatory Visit: Payer: Self-pay | Admitting: *Deleted

## 2015-08-09 DIAGNOSIS — R945 Abnormal results of liver function studies: Secondary | ICD-10-CM

## 2015-08-09 DIAGNOSIS — R7989 Other specified abnormal findings of blood chemistry: Secondary | ICD-10-CM

## 2015-08-09 NOTE — Telephone Encounter (Signed)
Called patient back - see result note for details 

## 2015-08-14 ENCOUNTER — Telehealth: Payer: Self-pay | Admitting: *Deleted

## 2015-08-14 NOTE — Telephone Encounter (Signed)
-----   Message from Manus Gunning, MD sent at 08/13/2015  6:16 PM EDT ----- Rollene Fare, Can you please tell this patient I discussed her case with Dr. Vaughan Browner and we are going to continue her prednisone dosed at present regimen for a prolonged course as I think she is getting better. Hopefully we don't have to use alternative regimens for her case. I would like to repeat LFTs in 6-8 weeks or so. Thanks  ----- Message -----    From: Marshell Garfinkel, MD    Sent: 08/13/2015   4:12 PM      To: Manus Gunning, MD  Maybe we can continue pred at current dose for a while and try to taper later this year. If she does not tolerate the taper then send to rheumatology?    ----- Message -----    From: Manus Gunning, MD    Sent: 08/07/2015   1:21 PM      To: Marshell Garfinkel, MD  HI Praveen, I hope you are doing well.  I wanted to touch base about our mutual patient. She had a liver biopsy showing hepatic sarcoid. Fortunately no cirrhosis and her portal pressures are normal, although she does have some fibrotic changes. Her AP has been as high as 1000 and now downtrending on steroids. I was curious to get your thoughts for her case for long term management. I have used prolonged courses of steroids for this in the past, and have used anti-TNF therapy once for severe hepatic sarcoid. Do you have much experience with anti-TNF therapy for sarcoid?  Richardson Landry

## 2015-08-14 NOTE — Telephone Encounter (Signed)
Patient given recommendations. She states she is already scheduled for labs.

## 2015-08-14 NOTE — Telephone Encounter (Signed)
Left a message for patient to call back. 

## 2015-08-19 ENCOUNTER — Ambulatory Visit (INDEPENDENT_AMBULATORY_CARE_PROVIDER_SITE_OTHER): Payer: Medicaid Other | Admitting: Gastroenterology

## 2015-08-19 DIAGNOSIS — Z23 Encounter for immunization: Secondary | ICD-10-CM | POA: Diagnosis not present

## 2015-08-19 IMAGING — US US PELVIS COMPLETE
1 series · 13 of 25 positions shown · non-contrast
Comparison: None

CLINICAL DATA: Dysfunctional uterine bleeding. Dysmenorrhea and
dysparenuria. LMP 04/05/2013

EXAM:
TRANSABDOMINAL AND TRANSVAGINAL ULTRASOUND OF PELVIS
TECHNIQUE: Both transabdominal and transvaginal ultrasound examinations of the
pelvis were performed. Transabdominal technique was performed for
global imaging of the pelvis including uterus, ovaries, adnexal
regions, and pelvic cul-de-sac. It was necessary to proceed with
endovaginal exam following the transabdominal exam to visualize the
myometrium and adnexal.

[Series 1: us pelvis complete · 13 of 66 slices shown]
[im 1/66]
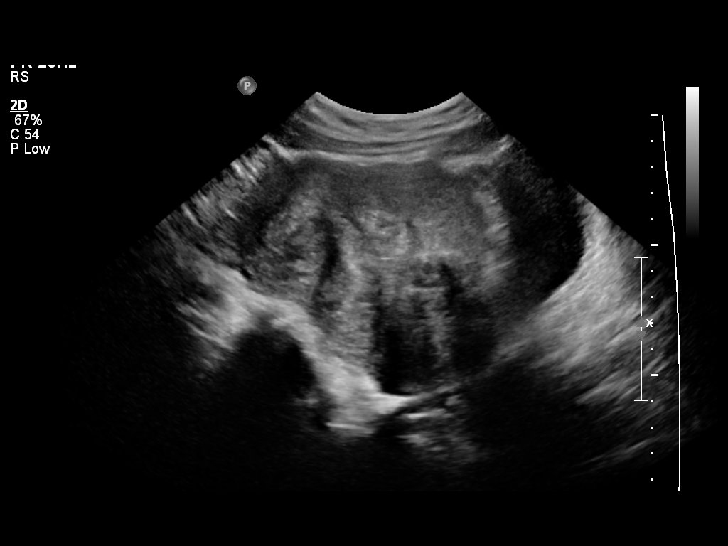
[im 6/66]
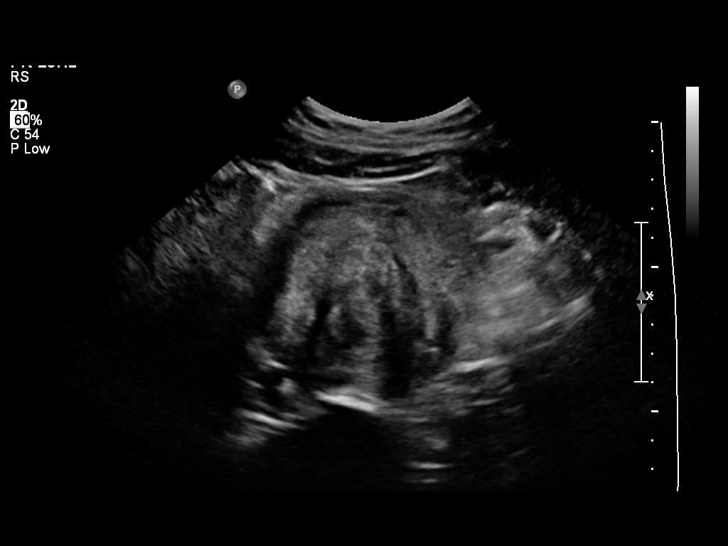
[im 11/66]
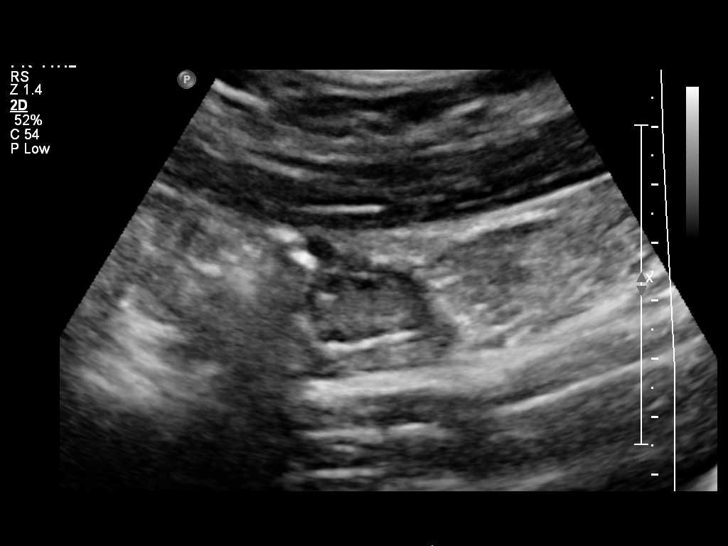
[im 17/66]
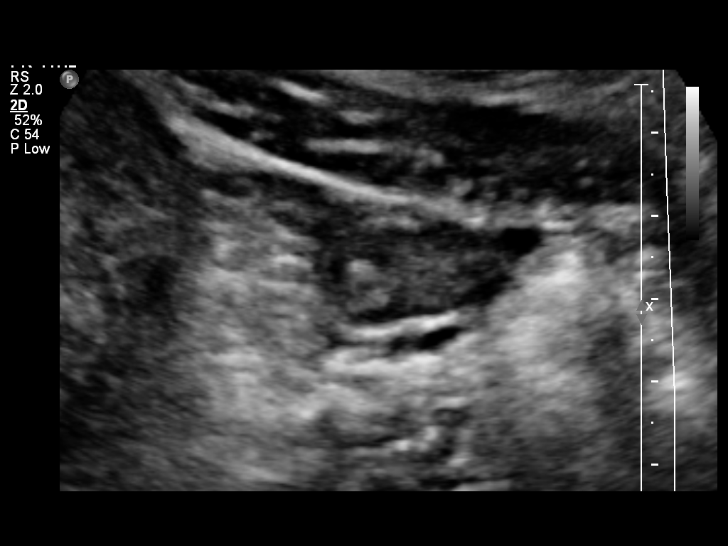
[im 22/66]
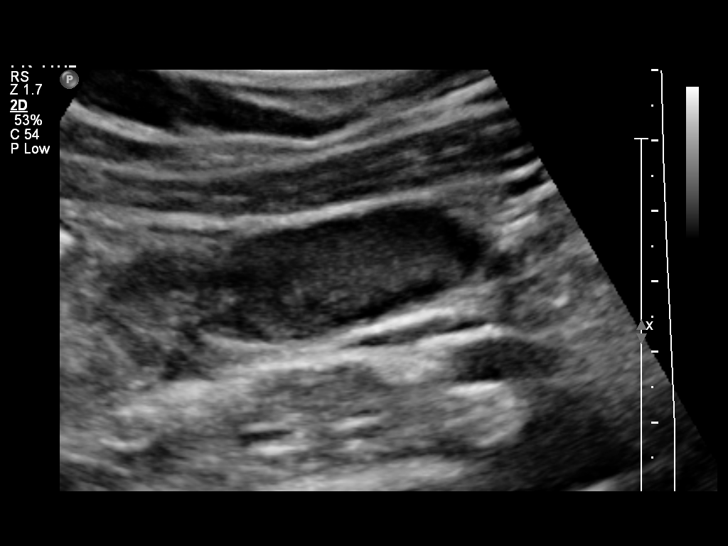
[im 28/66]
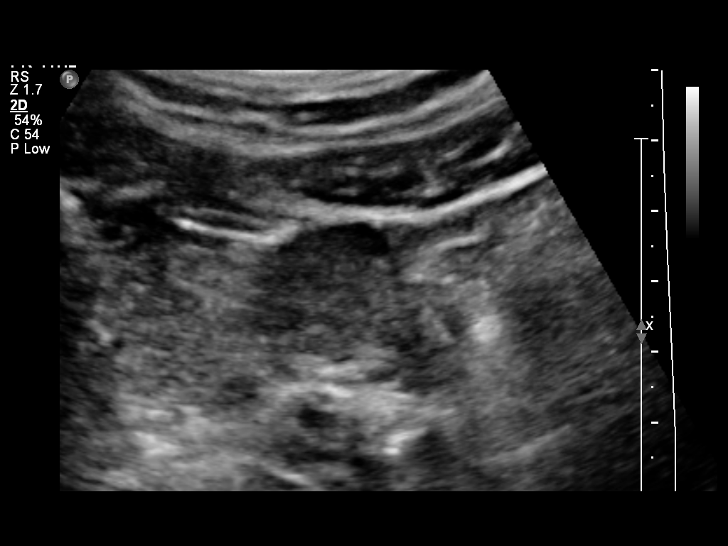
[im 33/66]
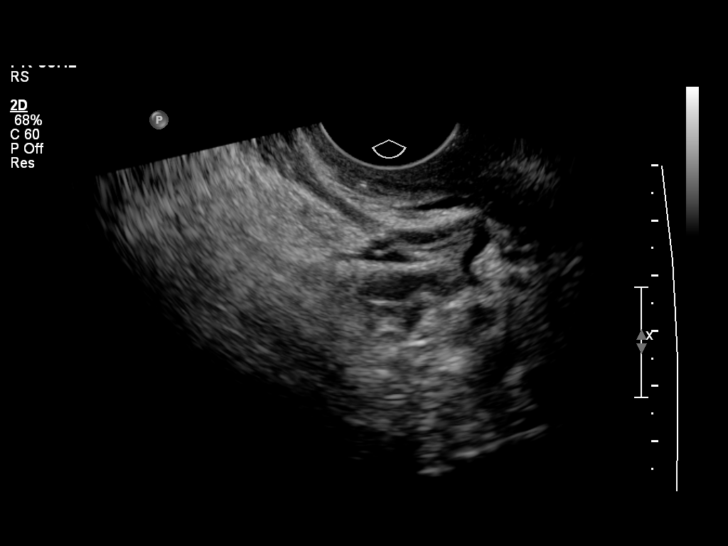
[im 38/66]
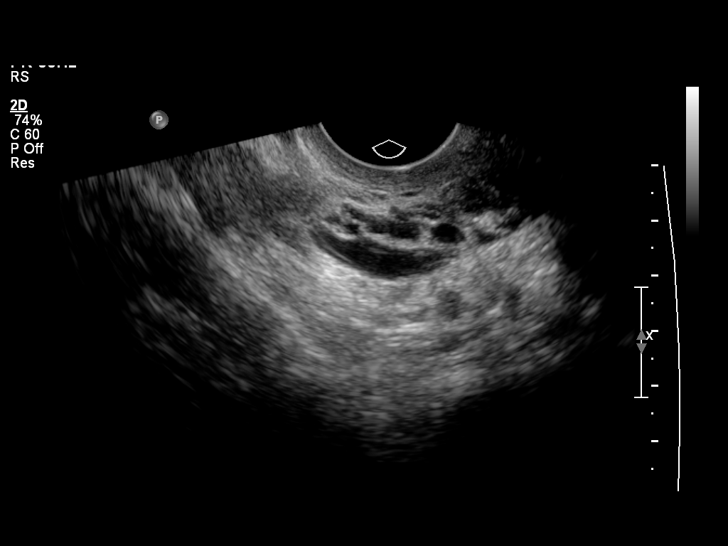
[im 44/66]
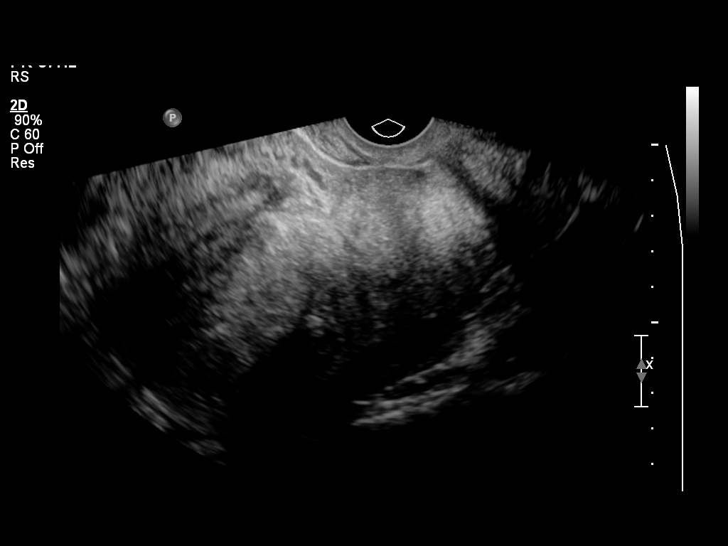
[im 49/66]
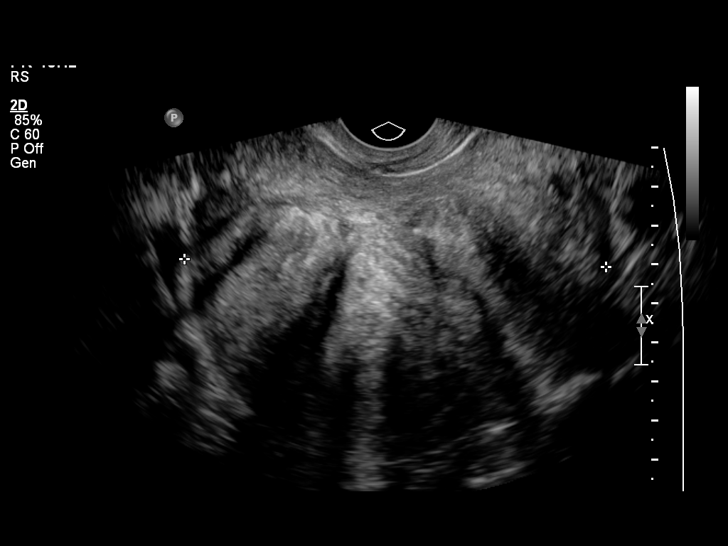
[im 55/66]
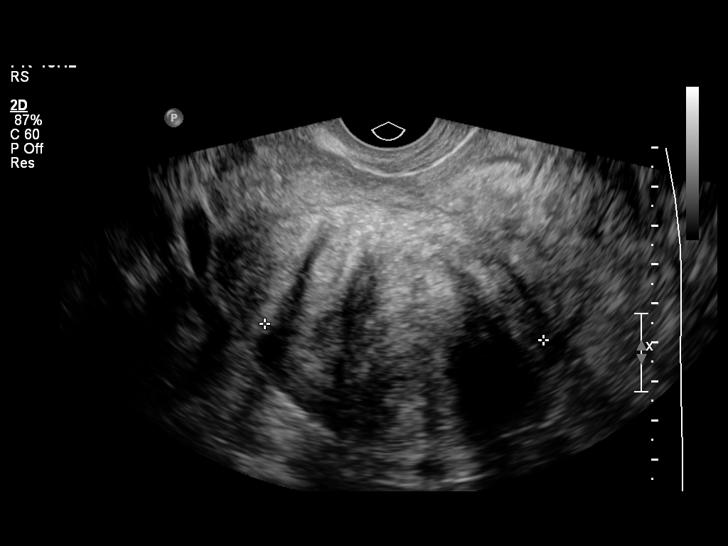
[im 60/66]
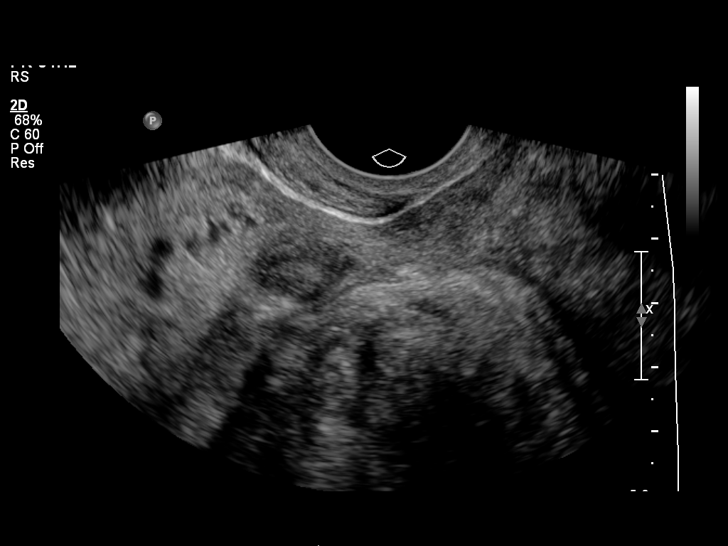
[im 66/66]
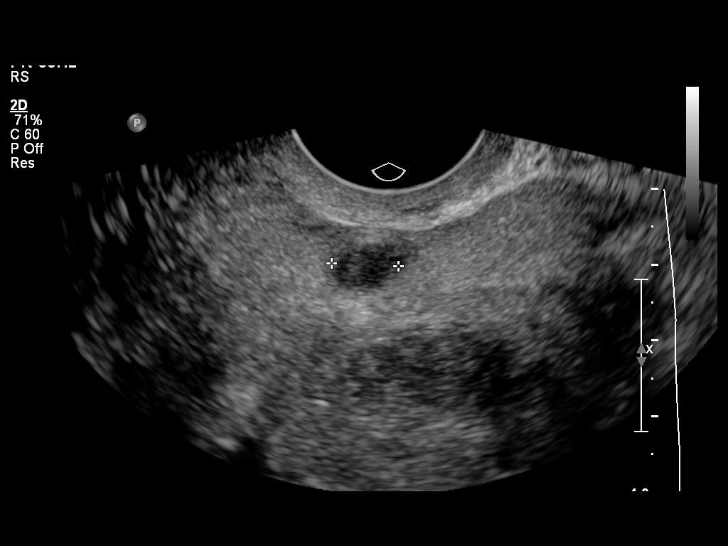

[13 of 25 positions shown; findings below may reference images not displayed]

FINDINGS: Uterus

Measurements: Is anteverted and anteflexed and demonstrates a
sagittal length of 14.3 cm, depth of 9.1 cm and width of 10.8 cm..
Multiple fibroids are identified with the largest located in the
right posterior upper uterine segment measuring 6.0 x 6.7 x 7.2 cm,
in the posterior right midbody measuring 5.7 x 4.6 x 5.1 cm, in the
anterior midbody measuring 1.7 x 1.2 x 1.3 cm, in the anterior upper
uterine segment measuring 1.3 x 0.8 x 1.4 cm and in the left lateral
lower uterine segment measuring 0.9 x 0.7 x 0.9 cm. Both of these
larger fibroids appear to deviate the endometrial canal suggesting a
partial submucosal component

Endometrium

Thickness: Cannot be accurately measured due to the fibroid load.

Right ovary

Measurements: 4.0 x 1.6 x 2.2 cm. Normal appearance/no adnexal mass.

Left ovary

Measurements: 2.4 x 1.2 x 2.3 cm. Normal appearance/no adnexal mass.

Other findings

No pelvic fluid is seen
IMPRESSION: Fibroid uterus with fibroid sizes and locations as noted above. The
endometrial assessment is compromised by the presence of the
fibroids, the 2 largest of which are felt likely to have a
submucosal component.

Normal transabdominal appearance to both ovaries.

## 2015-08-26 ENCOUNTER — Other Ambulatory Visit (INDEPENDENT_AMBULATORY_CARE_PROVIDER_SITE_OTHER): Payer: Medicaid Other

## 2015-08-26 DIAGNOSIS — R7989 Other specified abnormal findings of blood chemistry: Secondary | ICD-10-CM

## 2015-08-26 DIAGNOSIS — R945 Abnormal results of liver function studies: Secondary | ICD-10-CM

## 2015-08-26 LAB — HEPATIC FUNCTION PANEL
ALBUMIN: 3.4 g/dL — AB (ref 3.5–5.2)
ALT: 44 U/L — ABNORMAL HIGH (ref 0–35)
AST: 48 U/L — ABNORMAL HIGH (ref 0–37)
Alkaline Phosphatase: 352 U/L — ABNORMAL HIGH (ref 39–117)
Bilirubin, Direct: 0.2 mg/dL (ref 0.0–0.3)
Total Bilirubin: 0.7 mg/dL (ref 0.2–1.2)
Total Protein: 7.5 g/dL (ref 6.0–8.3)

## 2015-08-28 ENCOUNTER — Telehealth: Payer: Self-pay | Admitting: Pulmonary Disease

## 2015-08-28 ENCOUNTER — Other Ambulatory Visit: Payer: Self-pay | Admitting: *Deleted

## 2015-08-28 DIAGNOSIS — R7989 Other specified abnormal findings of blood chemistry: Secondary | ICD-10-CM

## 2015-08-28 DIAGNOSIS — R945 Abnormal results of liver function studies: Secondary | ICD-10-CM

## 2015-08-28 MED ORDER — PREDNISONE 10 MG PO TABS: 40.0000 mg | ORAL_TABLET | Freq: Every day | ORAL | Status: DC

## 2015-08-28 NOTE — Telephone Encounter (Signed)
Spoke with pt. She needs a refill on Prednisone. Rx has been sent in. Nothing further was needed.

## 2015-08-29 ENCOUNTER — Ambulatory Visit (INDEPENDENT_AMBULATORY_CARE_PROVIDER_SITE_OTHER): Payer: Medicaid Other | Admitting: Pulmonary Disease

## 2015-08-29 ENCOUNTER — Encounter: Payer: Self-pay | Admitting: Pulmonary Disease

## 2015-08-29 VITALS — BP 118/78 | HR 77 | Ht 66.0 in | Wt 155.0 lb

## 2015-08-29 DIAGNOSIS — R0600 Dyspnea, unspecified: Secondary | ICD-10-CM | POA: Diagnosis not present

## 2015-08-29 DIAGNOSIS — D869 Sarcoidosis, unspecified: Secondary | ICD-10-CM

## 2015-08-29 NOTE — Progress Notes (Signed)
Subjective:    Patient ID: SAHITI COLLAZO, female    DOB: May 29, 1970, 45 y.o.   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 Asthmatic bronchitis Chronic cough Ventricular tachycardia s/p AICD. Long QT syndrome Left pleural effusion   HPI Mrs. Pickle is 45 year old with stage III sarcoidosis, asthmatic bronchitis, chronic cough. She is a former patient of Dr. Joya Gaskins. She was diagnosed around 2003. Is not clear if she had a tissue diagnosis. There is record of a transbronchial biopsy in 2003 but no granulomas were seen. She had been on and off systemic prednisone. She was admitted in September with sarcoidosis flare. Bronchial with BAL showed 10K staph aureus which was thought to be a contaminant. She was given Solu-Medrol 60 q6 and then discharged on a slow prednisone taper starting at 60 mg. She is also being followed by cardiology for prolonged QT syndrome, family history of sudden death. Her ICD was changed in 04/07/15.   She underwent a thoracentesis in November 16 which showed exudative left-sided predominant effusion, and cultures were negative. Repeat CXR showed improvement in her effusion. She had been doing well until a few days ago when she developed fevers, worsening cough.    DATA: PFTs (08/29/14) FVC 2.98 (85%) FEV1 2.42 (85%) F/F 81  IMAGING: CT scan (01/21/15) 1. There is a spectrum of findings in the thorax that is most likely related to the patient's history of sarcoidosis. Specifically, there is a perilymphatic distribution of multiple tiny subcentimeter pulmonary nodules, in addition to mediastinal lymphadenopathy. 2. Dilatation of the pulmonic trunk (3.6 cm in diameter), suggestive of pulmonary arterial hypertension. 3. Splenomegaly. 4. Probable hepatomegaly.  CXR (03/03/15) Moderate left pleural effusion. Adjacent left lower lobe atelectasis versus infection. Possible lingular airspace disease as  well. New enlargement of the cardiopericardial silhouette, which could relate to cardiomegaly and/or pericardial effusion.  CXR (06/13/15) Cardiomegaly, mild CHF, improved left effusion  LABS: Echo 01/28/15 EF 60-65%, PAP 36.  ACE level 01/22/15 - 177.   Thoracentesis 03/29/15  Albumin 2.8, LDH 140, total protein 6.2, pH 8.1 WBC 5413, 92% lymphs Cultures, cytology  Negative  Liver biopsy 08/02/15 Granulomatous hepatitis with fibrosis   Past Medical History  Diagnosis Date  . Long Q-T syndrome   . Palpitation   . Seizure disorder ([place])     none recently  . Abdominal pain, periumbilic   . Esophageal stricture 2005/2009    esophageal strictures dilated 2005, 2009  . Sarcoidosis ([place])   . Allergic rhinitis   . Bronchitis   . GERD (gastroesophageal reflux disease) 2005  . Asthma   . Fibromyalgia   . Stroke ([place])     QUESTIONABLE TIA   . Neuropathy in diabetes ([place])   . Seizures ([place])   . Cancer ([place])     Nodules in lungs pt believes were cancerous pt unsure  . Arthritis   . Internal hemorrhoids 2010    2011 band ligation of int rrhoids.  . Eye hemorrhage     bilateral  . Fibroid, uterine   . Automatic implantable cardioverter-defibrillator in situ 2006, replaced 2008    MEDTRONIC  . DM (diabetes mellitus) ([place]) 1997/1998    type 1, initially gestational but soon after child birth developed IDDM  . History of blood transfusion years ago    Current outpatient prescriptions:  .  acetaminophen (TYLENOL) 500 MG tablet, Take 1,000 mg by mouth every 6 (six) hours as needed for mild pain or fever. Reported on 07/22/2015, Disp: ,  Rfl:  .  beclomethasone (QVAR) 80 MCG/ACT inhaler, Inhale 2 puffs into the lungs 2 (two) times daily., Disp: 1 Inhaler, Rfl: 5 .  clonazePAM (KLONOPIN) 0.5 MG tablet, Take 1 tablet (0.5 mg total) by mouth daily., Disp: 30 tablet, Rfl: 0 .  cyclobenzaprine (FLEXERIL) 10 MG tablet, Take 10 mg by mouth 3 (three) times daily as needed for muscle spasms.  Reported on 07/22/2015, Disp: , Rfl:  .  cycloSPORINE (RESTASIS) 0.05 % ophthalmic emulsion, Place 1 drop into both eyes 2 (two) times daily., Disp: 0.4 mL, Rfl: 0 .  fluticasone (FLONASE) 50 MCG/ACT nasal spray, Place 2 sprays into both nostrils daily as needed for allergies. , Disp: , Rfl:  .  gabapentin (NEURONTIN) 100 MG capsule, Take 1 capsule (100 mg total) by mouth 3 (three) times daily., Disp: 90 capsule, Rfl: 2 .  Insulin Glargine (LANTUS SOLOSTAR) 100 UNIT/ML Solostar Pen, Inject 30 Units into the skin at bedtime. , Disp: , Rfl:  .  lactulose (CHRONULAC) 10 GM/15ML solution, Take 30 ml TID, Disp: 2700 mL, Rfl: 2 .  Linaclotide (LINZESS) 145 MCG CAPS capsule, Take 1 capsule (145 mcg total) by mouth daily., Disp: 30 capsule, Rfl: 3 .  meclizine (ANTIVERT) 25 MG tablet, Take 12.5 mg by mouth 3 (three) times daily as needed for dizziness (take 1/2 to 1 tablet by mouth as needed for dizziness). , Disp: , Rfl:  .  NOVOLOG FLEXPEN 100 UNIT/ML FlexPen, Inject 4-8 Units into the skin 3 (three) times daily., Disp: , Rfl: 0 .  ondansetron (ZOFRAN) 4 MG tablet, Take 1 tablet (4 mg total) by mouth every 6 (six) hours as needed for nausea or vomiting., Disp: 60 tablet, Rfl: 0 .  pantoprazole (PROTONIX) 40 MG tablet, Take 1 tablet (40 mg total) by mouth daily at 6 (six) AM., Disp: 30 tablet, Rfl: 2 .  potassium chloride SA (K-DUR,KLOR-CON) 20 MEQ tablet, Take 1 tablet (20 mEq total) by mouth daily., Disp: 30 tablet, Rfl: 2 .  predniSONE (DELTASONE) 10 MG tablet, Take 4 tablets (40 mg total) by mouth daily with breakfast., Disp: 120 tablet, Rfl: 3 .  propranolol (INDERAL) 80 MG tablet, Take 1 tablet (80 mg total) by mouth 2 (two) times daily., Disp: 30 tablet, Rfl: 1   Review of Systems Chronic cough, nonproductive area denies any sputum production, fevers, chills, hemoptysis. Denies any chest pain, palpitations. Denies any nausea, vomiting, diarrhea, constipation. Denies any malaise, loss of weight or  loss of appetite. All other review of systems are negative.    Objective:   Physical Exam  Blood pressure 118/78, pulse 77, height 5\' 6"  (1.676 m), weight 155 lb (70.308 kg), last menstrual period 07/23/2014, SpO2 96 %. Gen.: Frail appearing female. No apparent distress Neuro: No gross focal deficits. Neck: No JVD, lymphadenopathy, thyromegaly. RS: Clear, No crackles or wheezes heard. CVS: S1-S2 heard, no murmurs rubs gallops. Abdomen: Soft, positive bowel sounds. Extremities: No edema.    Assessment & Plan:  #1 Sarcoidosis, stage III She continues on prednisone at 40 mg for now. She had a recent eval with Dr. Havery Moros for elevated LFTs. Biopsy confirms hepatic sarcoidosis. I have discussed the case with Dr. Havery Moros. As her LFTs are improving on the current dose of prednisone we will not add any additional therpies. She will need a prolonged course with slow taper as she did not tolerate a rapid reduction in past.   She will be started on Cal+ VitD while she is on steroids. I considered PJP  prophylaxis with bactrim but decided against it because of her hepatitis.   #2 Asthmatic bronchitis. She is doing well on the Qvar inhaler. I'll continue the same.  #3 Cough Stable  #4 Left pleural effusion.  Thoracentesis performed which showed chronic inflammation, no infection, malignancy. It is likely that her effusion is related to her sarcoidosis. Repeat CXR shows improving effusion. We will continue to monitor  Plan: - Continue prednisone at 40 mg daily for now. - Start calcium + VitD - Return to clinic in 3 month  Marshell Garfinkel MD [facility] Pager [phone] If no answer or after 3pm call: [phone] 08/29/2015, 3:27 PM

## 2015-08-29 NOTE — Patient Instructions (Signed)
Will start you on calcium and vitamin D tablets. Continue using the prednisone at 40 mg  I'll see you in clinic in 3 months.

## 2015-09-17 ENCOUNTER — Ambulatory Visit (INDEPENDENT_AMBULATORY_CARE_PROVIDER_SITE_OTHER): Payer: Medicaid Other | Admitting: *Deleted

## 2015-09-17 DIAGNOSIS — Z95810 Presence of automatic (implantable) cardiac defibrillator: Secondary | ICD-10-CM | POA: Diagnosis not present

## 2015-09-17 DIAGNOSIS — I4581 Long QT syndrome: Secondary | ICD-10-CM | POA: Diagnosis not present

## 2015-09-17 NOTE — Progress Notes (Signed)
Remote ICD transmission.   

## 2015-10-03 ENCOUNTER — Telehealth: Payer: Self-pay | Admitting: *Deleted

## 2015-10-03 NOTE — Telephone Encounter (Signed)
Left a message for patient that labs are due. Requested she call and confirm receipt of message.

## 2015-10-03 NOTE — Telephone Encounter (Signed)
-----   Message from Hulan Saas, RN sent at 08/28/2015  9:43 AM EDT ----- Call and remind needs LFt for SA on 10/07/15. Lab in EPIC.

## 2015-10-03 NOTE — Telephone Encounter (Signed)
Left a message for patient to call back. 

## 2015-10-04 ENCOUNTER — Encounter: Payer: Self-pay | Admitting: Family Medicine

## 2015-10-04 ENCOUNTER — Ambulatory Visit (INDEPENDENT_AMBULATORY_CARE_PROVIDER_SITE_OTHER): Payer: Medicaid Other | Admitting: Family Medicine

## 2015-10-04 VITALS — BP 112/76 | HR 82 | Temp 98.2°F | Ht 66.0 in | Wt 158.1 lb

## 2015-10-04 DIAGNOSIS — M79645 Pain in left finger(s): Secondary | ICD-10-CM | POA: Diagnosis not present

## 2015-10-04 DIAGNOSIS — E118 Type 2 diabetes mellitus with unspecified complications: Secondary | ICD-10-CM | POA: Diagnosis not present

## 2015-10-04 DIAGNOSIS — B351 Tinea unguium: Secondary | ICD-10-CM | POA: Diagnosis not present

## 2015-10-04 DIAGNOSIS — R208 Other disturbances of skin sensation: Secondary | ICD-10-CM | POA: Diagnosis not present

## 2015-10-04 DIAGNOSIS — R200 Anesthesia of skin: Secondary | ICD-10-CM

## 2015-10-04 LAB — POCT GLYCOSYLATED HEMOGLOBIN (HGB A1C): Hemoglobin A1C: 12.7

## 2015-10-04 MED ORDER — IBUPROFEN 600 MG PO TABS: 600.0000 mg | ORAL_TABLET | Freq: Three times a day (TID) | ORAL | Status: DC | PRN

## 2015-10-04 MED ORDER — INSULIN GLARGINE 100 UNIT/ML SOLOSTAR PEN: 35.0000 [IU] | PEN_INJECTOR | Freq: Every day | SUBCUTANEOUS | Status: DC

## 2015-10-04 MED ORDER — NOVOLOG FLEXPEN 100 UNIT/ML ~~LOC~~ SOPN: 4.0000 [IU] | PEN_INJECTOR | Freq: Three times a day (TID) | SUBCUTANEOUS | Status: DC

## 2015-10-04 MED ORDER — EFINACONAZOLE 10 % EX SOLN
CUTANEOUS | Status: DC
Start: 2015-10-04 — End: 2015-11-29

## 2015-10-04 MED ORDER — GABAPENTIN 300 MG PO CAPS: 300.0000 mg | ORAL_CAPSULE | Freq: Three times a day (TID) | ORAL | Status: DC

## 2015-10-04 NOTE — Progress Notes (Signed)
    Subjective    [name] is a 45 y.o. female that presents for a follow-up visit for:   1. Feet are numb: Symptoms started two days ago. She has numbness in her mid to distal feet. She has tried massaging and soaking her feet. She is not falling because of this. This is similar to her neuropathy, but progressed. She has seen her pulmonologist, who says it is secondary to diabetic neuropathy.  2. Diabetes: patient is adherent with Lantus 30u at night and Novolog 2-4u by sliding scale only twice per day. Patient states she has a log but did not bring it. Her blood sugar yesterday morning was 245. She has been on prednisone 40mg  for her pulmonary sarcoidosis.  3. Hand issue: Symptoms started two weeks ago with bumps on the medial knuckles of digits 4 and 5 of left hand. She reports no trauma prior to symptoms. She has pain in her two last fingers when trying to close them. No fevers, nausea or vomiting. Nothing she can think of preceded her symptoms.  Social History  Substance Use Topics  . Smoking status: Never Smoker   . Smokeless tobacco: Never Used  . Alcohol Use: 0.0 oz/week    0 Standard drinks or equivalent per week     Comment: occ.    Allergies  Allergen Reactions  . Contrast Media [Iodinated Diagnostic Agents] Anaphylaxis and Shortness Of Breath    Needed to be defibrillated and patient was pre-medicated with radiology 13 hour premeds.   . Augmentin [Amoxicillin-Pot Clavulanate] Other (See Comments)    "stomach hurt"  . Codeine Nausea And Vomiting    jittery  . Demerol [Meperidine]   . Iohexol      Code: RASH, Desc: PT STATES SHE IS ALLERGIC TO IV CONTRAST 05/28/06/RM, Onset Date: [id number]   . Morphine And Related Nausea And Vomiting    No orders of the defined types were placed in this encounter.    ROS  Per HPI   Objective   BP 112/76 mmHg  Pulse 82  Temp(Src) 98.2 F (36.8 C) (Oral)  Ht 5\' 6"  (1.676 m)  Wt 158 lb 1.6 oz (71.714 kg)  BMI 25.53 kg/m2   LMP 07/23/2014  Vital signs reviewed  General: Well appearing, no distress Musculoskeletal: Left hand with tender nodules on medial aspect of fifth and fourth digits. Diabetic foot exam documented Skin: Fungal infection of great toes bilaterally  Assessment and Plan    1. Type 2 diabetes mellitus with complication, unspecified long term insulin use status (HCC) Increase to Lantus 35 units at night. Recommend increase to Novolog 2-4 units. Asked to please bring logs next time she comes in. Initially told to follow-up in three months, but will contact to follow-up in one month since A1C is so much elevated (result obtained after patient left) - HgB A1c - Insulin Glargine (LANTUS SOLOSTAR) 100 UNIT/ML Solostar Pen; Inject 35 Units into the skin at bedtime.  Dispense: 15 mL; Refill: 0 - Ambulatory referral to Podiatry  2. Onychomycosis - Efinaconazole 10 % SOLN; Apply to affected toenails for 6 months  Dispense: 1 Bottle; Refill: 0 - Ambulatory referral to Podiatry  3. Pain of finger of left hand Unsure of etiology. Possibly related to chronic irritation. Recommend short course of NSAIDs. Will reevaluate.   4. Numbness in feet Likely secondary to diabetic neuropathy. Will increase gabapentin to 300mg  TID. Creatinine check before increase.

## 2015-10-04 NOTE — Patient Instructions (Signed)
Thank you for coming to see me today. It was a pleasure. Today we talked about:   Diabetes: Increase to Lantus 35units daily.   Numb feet: This is likely neuropathy: I will increase your gabapentin. I will also send you to the podiatrist. I will treat the nails with an antifungal solution  Hand: this appears to be arthritis. Please use ibuprofen 600mg  up to three times per day.  Please make an appointment to see me or your new doctor for follow-up. Me within the month or your new doctor in 3 months.  If you have any questions or concerns, please do not hesitate to call the office at [phone].  Sincerely,  Cordelia Poche, MD

## 2015-10-07 NOTE — Telephone Encounter (Signed)
Patient will come for labs.  

## 2015-10-07 NOTE — Telephone Encounter (Signed)
Left a message for patient to call back. 

## 2015-10-09 ENCOUNTER — Other Ambulatory Visit (INDEPENDENT_AMBULATORY_CARE_PROVIDER_SITE_OTHER): Payer: Medicaid Other

## 2015-10-09 DIAGNOSIS — R945 Abnormal results of liver function studies: Secondary | ICD-10-CM

## 2015-10-09 DIAGNOSIS — R7989 Other specified abnormal findings of blood chemistry: Secondary | ICD-10-CM | POA: Diagnosis not present

## 2015-10-09 LAB — HEPATIC FUNCTION PANEL
ALK PHOS: 289 U/L — AB (ref 39–117)
ALT: 47 U/L — AB (ref 0–35)
AST: 41 U/L — ABNORMAL HIGH (ref 0–37)
Albumin: 3.8 g/dL (ref 3.5–5.2)
BILIRUBIN DIRECT: 0.2 mg/dL (ref 0.0–0.3)
Total Bilirubin: 0.8 mg/dL (ref 0.2–1.2)
Total Protein: 7.4 g/dL (ref 6.0–8.3)

## 2015-10-10 ENCOUNTER — Other Ambulatory Visit: Payer: Self-pay | Admitting: *Deleted

## 2015-10-10 DIAGNOSIS — R7989 Other specified abnormal findings of blood chemistry: Secondary | ICD-10-CM

## 2015-10-10 DIAGNOSIS — R945 Abnormal results of liver function studies: Secondary | ICD-10-CM

## 2015-10-24 ENCOUNTER — Telehealth: Payer: Self-pay

## 2015-10-24 NOTE — Telephone Encounter (Signed)
Pt asking for something to help with the pain in her hand. Advil isn't working. (she has an appt on June 28th) Ottis Stain, CMA

## 2015-10-24 NOTE — Telephone Encounter (Signed)
Made an appt for pt on June 28th at [facility], [place]

## 2015-10-24 NOTE — Telephone Encounter (Signed)
-----   Message from Mariel Aloe, MD sent at 10/23/2015  7:51 PM EDT ----- Please have patient schedule an appointment to see me on the 28th if she is able. This will be to discuss her diabetes. Because her A1C is high, I would rather see her before sooner rather than her next doctor see her later. Thanks!

## 2015-10-25 ENCOUNTER — Encounter: Payer: Self-pay | Admitting: Cardiology

## 2015-10-25 MED ORDER — IBUPROFEN 600 MG PO TABS: 600.0000 mg | ORAL_TABLET | Freq: Three times a day (TID) | ORAL | Status: DC | PRN

## 2015-10-25 NOTE — Telephone Encounter (Signed)
Refilled ibuprofen

## 2015-10-28 ENCOUNTER — Telehealth: Payer: Self-pay | Admitting: *Deleted

## 2015-10-28 LAB — CUP PACEART REMOTE DEVICE CHECK
Battery Voltage: 3.08 V
Brady Statistic RV Percent Paced: 0.01 %
Date Time Interrogation Session: 20170502101703
HIGH POWER IMPEDANCE MEASURED VALUE: 51 Ohm
HighPow Impedance: 70 Ohm
Implantable Lead Model: 6947
Lead Channel Impedance Value: 342 Ohm
Lead Channel Impedance Value: 437 Ohm
Lead Channel Pacing Threshold Amplitude: 1.375 V
Lead Channel Sensing Intrinsic Amplitude: 6.75 mV
Lead Channel Sensing Intrinsic Amplitude: 6.75 mV
Lead Channel Setting Sensing Sensitivity: 0.3 mV
MDC IDC LEAD IMPLANT DT: 20080828
MDC IDC LEAD LOCATION: 753860
MDC IDC MSMT BATTERY REMAINING LONGEVITY: 134 mo
MDC IDC MSMT LEADCHNL RV PACING THRESHOLD PULSEWIDTH: 0.4 ms
MDC IDC SET LEADCHNL RV PACING AMPLITUDE: 3 V
MDC IDC SET LEADCHNL RV PACING PULSEWIDTH: 0.4 ms

## 2015-10-28 NOTE — Telephone Encounter (Signed)
Contacted pt and scheduled an appointment with doctor on white team for tomorrow since Dr. Teryl Lucy was not available per Dr. Jerline Pain. Katharina Caper, Dujuan Stankowski D, [place]

## 2015-10-28 NOTE — Telephone Encounter (Signed)
Patient needs to schedule an appointment if her pain is not being adequately controlled with ibuprofen.  Algis Greenhouse. Jerline Pain, [facility] Resident PGY-2 10/28/2015 10:21 AM

## 2015-10-28 NOTE — Telephone Encounter (Signed)
Patient called back and states that her pain is not being relieved by the ibuprofen.  She is already taking it as prescribed and it's "barely taking the edge off".  She would like to know if she can get something stronger or will she need to have an appt to discuss this.  Jazmin Hartsell,CMA

## 2015-10-28 NOTE — Telephone Encounter (Signed)
Pt is unable to make appointment tomorrow.  She wants to know if she can take the ibuprofen every 6 hours instead of every 8 hours. Kensie Susman, Salome Spotted, CMA

## 2015-10-28 NOTE — Telephone Encounter (Signed)
Will forward to MD to advise. Delance Weide,CMA  

## 2015-10-29 ENCOUNTER — Ambulatory Visit: Payer: Medicaid Other | Admitting: Family Medicine

## 2015-10-29 NOTE — Telephone Encounter (Signed)
LVM to call office back to inform pt of below. Zimmerman Rumple, [name], CMA  

## 2015-10-29 NOTE — Telephone Encounter (Signed)
Every 6 hours is fine as long as she doesn't exceed more than 3200mg  a day. This should not be taken long term as it has several adverse side effects including kidney injury and stomach ulcers. She needs to make an appointment as soon as possible.  Algis Greenhouse. Jerline Pain, [facility] Resident PGY-2 10/29/2015 8:31 AM

## 2015-10-31 NOTE — Telephone Encounter (Signed)
Tried to contact pt and phone only rang and then just stopped ringing with no option of LVM. [name], [name], [place]

## 2015-11-08 NOTE — Telephone Encounter (Signed)
LVM for pt to call, will wait for return call. Ottis Stain, CMA

## 2015-11-12 ENCOUNTER — Ambulatory Visit (INDEPENDENT_AMBULATORY_CARE_PROVIDER_SITE_OTHER): Payer: Medicaid Other | Admitting: Podiatry

## 2015-11-12 ENCOUNTER — Encounter: Payer: Self-pay | Admitting: Podiatry

## 2015-11-12 VITALS — BP 137/92 | HR 77 | Resp 12

## 2015-11-12 DIAGNOSIS — L608 Other nail disorders: Secondary | ICD-10-CM

## 2015-11-12 DIAGNOSIS — E0842 Diabetes mellitus due to underlying condition with diabetic polyneuropathy: Secondary | ICD-10-CM | POA: Diagnosis not present

## 2015-11-12 DIAGNOSIS — L609 Nail disorder, unspecified: Secondary | ICD-10-CM | POA: Diagnosis not present

## 2015-11-12 NOTE — Progress Notes (Signed)
   Subjective:    Patient ID: [name], female    DOB: [birth year], 45 y.o.   MRN: [id number]  HPI   This patient presents today complaining of elongated uncomfortable toenails for the past 3 months making shoe wearing walking uncomfortable. Patient has difficulty trimming nails and requests toenail debridement. She denies any recent professional care for this problem  Patient is a diabetic and denies history of skin ulceration, claudication or amputation    Review of Systems  HENT: Positive for hearing loss and sinus pressure.   Cardiovascular: Positive for leg swelling.       Objective:   Physical Exam  Orientated 3  Vascular: No peripheral edema bilaterally DP pulses 2/4 bilaterally PT pulses 2/4 bilaterally Capillary reflex immediate bilaterally  Neurological: Sensation to 10 g monofilament wire intact 3/5 bilaterally Vibratory sensation reactive bilaterally Ankle reflex equal and reactive bilaterally  Dermatological: No open skin lesions bilaterally The toenails elongated, incurvated 6-10  Musculoskeletal: Patient has unstable gait using cane HAV deformity left greater than right This note restriction ankle, subtalar, midtarsal joints bilaterally      Assessment & Plan:   Assessment: Diabetic with peripheral neuropathy Gait disturbance Incurvated toenails 6-10  Plan: Today reviewed the results of exam with patient today and offered nail debridement and she verbally consents. The toenails 6-10 are debrided mechanically at elective without any bleeding  Reappoint 3 months

## 2015-11-12 NOTE — Patient Instructions (Signed)
Diabetes and Foot Care Diabetes may cause you to have problems because of poor blood supply (circulation) to your feet and legs. This may cause the skin on your feet to become thinner, break easier, and heal more slowly. Your skin may become dry, and the skin may peel and crack. You may also have nerve damage in your legs and feet causing decreased feeling in them. You may not notice minor injuries to your feet that could lead to infections or more serious problems. Taking care of your feet is one of the most important things you can do for yourself.  HOME CARE INSTRUCTIONS  Wear shoes at all times, even in the house. Do not go barefoot. Bare feet are easily injured.  Check your feet daily for blisters, cuts, and redness. If you cannot see the bottom of your feet, use a mirror or ask someone for help.  Wash your feet with warm water (do not use hot water) and mild soap. Then pat your feet and the areas between your toes until they are completely dry. Do not soak your feet as this can dry your skin.  Apply a moisturizing lotion or petroleum jelly (that does not contain alcohol and is unscented) to the skin on your feet and to dry, brittle toenails. Do not apply lotion between your toes.  Trim your toenails straight across. Do not dig under them or around the cuticle. File the edges of your nails with an emery board or nail file.  Do not cut corns or calluses or try to remove them with medicine.  Wear clean socks or stockings every day. Make sure they are not too tight. Do not wear knee-high stockings since they may decrease blood flow to your legs.  Wear shoes that fit properly and have enough cushioning. To break in new shoes, wear them for just a few hours a day. This prevents you from injuring your feet. Always look in your shoes before you put them on to be sure there are no objects inside.  Do not cross your legs. This may decrease the blood flow to your feet.  If you find a minor scrape,  cut, or break in the skin on your feet, keep it and the skin around it clean and dry. These areas may be cleansed with mild soap and water. Do not cleanse the area with peroxide, alcohol, or iodine.  When you remove an adhesive bandage, be sure not to damage the skin around it.  If you have a wound, look at it several times a day to make sure it is healing.  Do not use heating pads or hot water bottles. They may burn your skin. If you have lost feeling in your feet or legs, you may not know it is happening until it is too late.  Make sure your health care provider performs a complete foot exam at least annually or more often if you have foot problems. Report any cuts, sores, or bruises to your health care provider immediately. SEEK MEDICAL CARE IF:   You have an injury that is not healing.  You have cuts or breaks in the skin.  You have an ingrown nail.  You notice redness on your legs or feet.  You feel burning or tingling in your legs or feet.  You have pain or cramps in your legs and feet.  Your legs or feet are numb.  Your feet always feel cold. SEEK IMMEDIATE MEDICAL CARE IF:   There is increasing redness,   swelling, or pain in or around a wound.  There is a red line that goes up your leg.  Pus is coming from a wound.  You develop a fever or as directed by your health care provider.  You notice a bad smell coming from an ulcer or wound.   This information is not intended to replace advice given to you by your health care provider. Make sure you discuss any questions you have with your health care provider.   Document Released: 05/01/2000 Document Revised: 01/04/2013 Document Reviewed: 10/11/2012 Elsevier Interactive Patient Education 2016 [employer]  

## 2015-11-13 ENCOUNTER — Encounter: Payer: Self-pay | Admitting: Family Medicine

## 2015-11-13 ENCOUNTER — Ambulatory Visit (INDEPENDENT_AMBULATORY_CARE_PROVIDER_SITE_OTHER): Payer: Medicaid Other | Admitting: Family Medicine

## 2015-11-13 VITALS — BP 136/85 | HR 78 | Temp 98.0°F | Ht 66.0 in | Wt 166.6 lb

## 2015-11-13 DIAGNOSIS — M79642 Pain in left hand: Secondary | ICD-10-CM | POA: Diagnosis not present

## 2015-11-13 DIAGNOSIS — M25562 Pain in left knee: Secondary | ICD-10-CM | POA: Diagnosis not present

## 2015-11-13 DIAGNOSIS — M25552 Pain in left hip: Secondary | ICD-10-CM

## 2015-11-13 DIAGNOSIS — E118 Type 2 diabetes mellitus with unspecified complications: Secondary | ICD-10-CM

## 2015-11-13 MED ORDER — INSULIN GLARGINE 100 UNIT/ML SOLOSTAR PEN: 40.0000 [IU] | PEN_INJECTOR | Freq: Every day | SUBCUTANEOUS | Status: DC

## 2015-11-13 MED ORDER — NAPROXEN 500 MG PO TABS: 500.0000 mg | ORAL_TABLET | Freq: Two times a day (BID) | ORAL | Status: DC

## 2015-11-13 NOTE — Patient Instructions (Signed)
Thank you for coming to see me today. It was a pleasure. Today we talked about:   Joint pains: I will prescribe the naproxen. Take this twice daily as needed with meals. He can try Tylenol 650 mg 3 times a day as needed as well. I will get some x-rays of your joints to make sure there is nothing bad going on.  Diabetes: I will increase your Lantus to 40 units at nighttime. I would like to start NovoLog, but if not able to check with him once per daily might need to hold this for now. We really need to get her A1c down. I think he will need another agent, but this can be discussed at your next visit.  Please make an appointment to see Dr. Juleen China in one month.  If you have any questions or concerns, please do not hesitate to call the office at [phone].  Sincerely,  Cordelia Poche, MD

## 2015-11-13 NOTE — Progress Notes (Signed)
Subjective    [name] is a 45 y.o. female that presents for a follow-up visit for:   1. Joint pain: She has multiple joint pain in her knees, hips and hands. Pain has worsened over the past few weeks. She reports having some issues gripping. She has no falls but feels very weak in general. No injuries to her joints. Prednisone is not helping with symptoms. She has taken Ibuprofen 600mg  which has not helped either.  2. Diabetes: Patient is adherent with Lantus 35 units at night. She reports blood sugars in 250 to 330s. No hypoglycemic episodes. Patient is still on prednisone but is being tapered down.  Social History  Substance Use Topics  . Smoking status: Never Smoker   . Smokeless tobacco: Never Used  . Alcohol Use: 0.0 oz/week    0 Standard drinks or equivalent per week     Comment: occ.    Allergies  Allergen Reactions  . Contrast Media [Iodinated Diagnostic Agents] Anaphylaxis and Shortness Of Breath    Needed to be defibrillated and patient was pre-medicated with radiology 13 hour premeds.   . Augmentin [Amoxicillin-Pot Clavulanate] Other (See Comments)    "stomach hurt"  . Codeine Nausea And Vomiting    jittery  . Demerol [Meperidine]   . Iohexol      Code: RASH, Desc: PT STATES SHE IS ALLERGIC TO IV CONTRAST 05/28/06/RM, Onset Date: [id number]   . Morphine And Related Nausea And Vomiting    No orders of the defined types were placed in this encounter.    ROS  Per HPI   Objective   BP 136/85 mmHg  Pulse 78  Temp(Src) 98 F (36.7 C) (Oral)  Ht 5\' 6"  (1.676 m)  Wt 166 lb 9.6 oz (75.569 kg)  BMI 26.90 kg/m2  LMP 07/23/2014  Vital signs reviewed  General: Well appearing, no distress Musculoskeletal:  Left hand: PIP of fourth and fifth digits are slightly tender with no erythema. No obvious deformity.  Left knee: Patellar crepitus. Full flexion and extension. Negative McMurray, valgus/varus stress. Medial and lateral joint line tenderness.  Right  knee: Mild medial and lateral joint line tenderness. Otherwise normal exam. Left hip: tenderness over greater trochanter.  Flexion limited to about 45 degrees secondary to pain. Unable to perform FABER and FADIR secondary to pain. 3/5 strength of flexion and abduction  Assessment and Plan    Diabetes mellitus, type 2 Patient needs better control but is hesitant to try new medication because of cost. Will increase Lantus to 40 units. I want to start Novolog to control sugars in the day time. Will hold off for next visit. Patient is also going to start tapering down prednisone which should help with control.   1. Type 2 diabetes mellitus with complication, unspecified long term insulin use status (HCC) - Insulin Glargine (LANTUS SOLOSTAR) 100 UNIT/ML Solostar Pen; Inject 40 Units into the skin at bedtime.  Dispense: 30 mL; Refill: 0  2. Left knee pain Unsure if pathology is in hip or knee. Possibly both. Will likely need physical therapy to strengthen muscles of the hips as they are very weak - DG Knee 3 Views Left; Future - naproxen (NAPROSYN) 500 MG tablet; Take 1 tablet (500 mg total) by mouth 2 (two) times daily with a meal.  Dispense: 10 tablet; Refill: 0  3. Hip pain, left Unsure if pathology is in hip or knee. Possibly both. Will likely need physical therapy to strengthen muscles of the hips as  they are very weak - DG HIPS BILAT WITH PELVIS 3-4 VIEWS; Future - naproxen (NAPROSYN) 500 MG tablet; Take 1 tablet (500 mg total) by mouth 2 (two) times daily with a meal.  Dispense: 10 tablet; Refill: 0  4. Pain of left hand Suspect osteoarthritis. - DG Hand Complete Left; Future - naproxen (NAPROSYN) 500 MG tablet; Take 1 tablet (500 mg total) by mouth 2 (two) times daily with a meal.  Dispense: 10 tablet; Refill: 0

## 2015-11-14 NOTE — Assessment & Plan Note (Signed)
Patient needs better control but is hesitant to try new medication because of cost. Will increase Lantus to 40 units. I want to start Novolog to control sugars in the day time. Will hold off for next visit. Patient is also going to start tapering down prednisone which should help with control.

## 2015-11-20 ENCOUNTER — Ambulatory Visit
Admission: RE | Admit: 2015-11-20 | Discharge: 2015-11-20 | Disposition: A | Discharge status: Home / Self Care | Payer: Medicaid Other | Source: Ambulatory Visit | Attending: Family Medicine | Admitting: Family Medicine

## 2015-11-20 DIAGNOSIS — M25552 Pain in left hip: Secondary | ICD-10-CM

## 2015-11-20 DIAGNOSIS — M25562 Pain in left knee: Secondary | ICD-10-CM

## 2015-11-20 DIAGNOSIS — M79642 Pain in left hand: Secondary | ICD-10-CM

## 2015-11-21 ENCOUNTER — Telehealth: Payer: Self-pay | Admitting: *Deleted

## 2015-11-21 NOTE — Telephone Encounter (Signed)
Left a message for patient to call back. 

## 2015-11-21 NOTE — Telephone Encounter (Signed)
-----   Message from Hulan Saas, RN sent at 10/10/2015  9:44 AM EDT ----- Call and remind patient due for LFT for SA on 11/25/15. Lab in.

## 2015-11-22 ENCOUNTER — Telehealth: Payer: Self-pay | Admitting: Gastroenterology

## 2015-11-22 NOTE — Telephone Encounter (Signed)
See additional note. 

## 2015-11-22 NOTE — Telephone Encounter (Signed)
Pt aware.

## 2015-11-25 ENCOUNTER — Other Ambulatory Visit (INDEPENDENT_AMBULATORY_CARE_PROVIDER_SITE_OTHER): Payer: Medicaid Other

## 2015-11-25 DIAGNOSIS — R7989 Other specified abnormal findings of blood chemistry: Secondary | ICD-10-CM

## 2015-11-25 DIAGNOSIS — R945 Abnormal results of liver function studies: Secondary | ICD-10-CM

## 2015-11-25 LAB — HEPATIC FUNCTION PANEL
ALBUMIN: 3.9 g/dL (ref 3.5–5.2)
ALT: 24 U/L (ref 0–35)
AST: 23 U/L (ref 0–37)
Alkaline Phosphatase: 207 U/L — ABNORMAL HIGH (ref 39–117)
Bilirubin, Direct: 0.2 mg/dL (ref 0.0–0.3)
TOTAL PROTEIN: 7.9 g/dL (ref 6.0–8.3)
Total Bilirubin: 0.9 mg/dL (ref 0.2–1.2)

## 2015-11-28 ENCOUNTER — Other Ambulatory Visit: Payer: Self-pay

## 2015-11-28 MED ORDER — LACTULOSE 10 GM/15ML PO SOLN: ORAL | Status: DC

## 2015-11-29 ENCOUNTER — Encounter: Payer: Self-pay | Admitting: Pulmonary Disease

## 2015-11-29 ENCOUNTER — Ambulatory Visit (INDEPENDENT_AMBULATORY_CARE_PROVIDER_SITE_OTHER): Payer: Medicaid Other | Admitting: Pulmonary Disease

## 2015-11-29 VITALS — BP 144/82 | HR 80 | Ht 66.0 in | Wt 161.4 lb

## 2015-11-29 DIAGNOSIS — D862 Sarcoidosis of lung with sarcoidosis of lymph nodes: Secondary | ICD-10-CM

## 2015-11-29 MED ORDER — SULFAMETHOXAZOLE-TRIMETHOPRIM 800-160 MG PO TABS
1.0000 | ORAL_TABLET | ORAL | Status: DC
Start: 2015-11-29 — End: 2017-03-03

## 2015-11-29 MED ORDER — AZITHROMYCIN 250 MG PO TABS: ORAL_TABLET | ORAL | Status: DC

## 2015-11-29 NOTE — Patient Instructions (Addendum)
It is nice to meet you today. We will prescribe a z-pack for your upper respiratory infection. When z-pack is finished, please start Bactrim 160 mg three times a week. Please take probiotic with antibiotics. Rest and hydrate. Try zantac 150 mg before bed for itching. You can also try calamine lotion. Decrease your prednisone to 30 mg daily until next visit with either Dr. Vaughan Browner or Dr. Havery Moros when they will guide you on further weaning. Labs in 1 month ( BMET) Follow up in 3 months with Dr. Vaughan Browner Please contact office for sooner follow up if symptoms do not improve or worsen or seek emergency care

## 2015-11-29 NOTE — Progress Notes (Signed)
History of Present Illness [name] is a 45 y.o. female with Stage III sarcoidosis, asthmatic bronchitis, and chronic cough followed by Dr. Posey Pronto patient of Dr. Joya Gaskins.  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 Asthmatic bronchitis Chronic cough Ventricular tachycardia s/p AICD. Long QT syndrome Left pleural effusion 03/2015>> Thoracentesis >>exudative    7/14/20173 Month follow up visit: Pt. Presents to the office today for 3 month follow up. She was last seen by Dr. Vaughan Browner on 08/29/2015. She has recently been diagnosed with liver sarcoid ( Biopsy confirmed) and is currently on Prednisone 40 mg daily. Care is being managed by both Dr. Vaughan Browner and Dr. Havery Moros. Plan was to stabilize LFT's on prednisone and then slowly taper . LFT's are slowly normalizing. Her Breathing was at baseline until earlier this week, when she started coughing up green dry mucus. She has fatigue, exertional dyspnea and is itching, but no sarcoid rash. She has not changed soap, or laundry detergent. She does have a runny nose with this.She states fever of 100.3 yesterday. Afebrile today. She states that she is not wheezing. She is compliant with her Qvar and Flonase every day, as well as with her prednisone.She states she has left sided coldness, hands and legs. She has been falling,  in the last 3 weeks she has had 2 falls, with increasing dizziness.She does walk with a cane.She has had an eye exam recently.    Plan: at last visit.>> - Continue prednisone at 40 mg daily for now. - Start calcium + VitD - Return to clinic in 3 month  Tests  DATA: PFTs (08/29/14) FVC 2.98 (85%) FEV1 2.42 (85%) F/F 81  IMAGING: CT scan (01/21/15) 1. There is a spectrum of findings in the thorax that is most likely related to the patient's history of sarcoidosis. Specifically, there is a perilymphatic distribution of multiple tiny subcentimeter pulmonary nodules, in addition  to mediastinal lymphadenopathy. 2. Dilatation of the pulmonic trunk (3.6 cm in diameter), suggestive of pulmonary arterial hypertension. 3. Splenomegaly. 4. Probable hepatomegaly.  CXR (03/03/15) Moderate left pleural effusion. Adjacent left lower lobe atelectasis versus infection. Possible lingular airspace disease as well. New enlargement of the cardiopericardial silhouette, which could relate to cardiomegaly and/or pericardial effusion.  CXR (06/13/15) Cardiomegaly, mild CHF, improved left effusion  LABS: Echo 01/28/15 EF 60-65%, PAP 36.  ACE level 01/22/15 - 177.   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 Long Q-T syndrome   . Palpitation   . Seizure disorder ([place])     none recently  . Abdominal pain, periumbilic   . Esophageal stricture 2005/2009    esophageal strictures dilated 2005, 2009  . Sarcoidosis ([place])   . Allergic rhinitis   . Bronchitis   . GERD (gastroesophageal reflux disease) 2005  . Asthma   . Fibromyalgia   . Stroke ([place])     QUESTIONABLE TIA   . Neuropathy in diabetes ([place])   . Seizures ([place])   . Cancer ([place])     Nodules in lungs pt believes were cancerous pt unsure  . Arthritis   . Internal hemorrhoids 2010    2011 band ligation of int rrhoids.  . Eye hemorrhage     bilateral  . Fibroid, uterine   . Automatic implantable cardioverter-defibrillator in situ 2006, replaced 2008    [employer]TRONIC  .  DM (diabetes mellitus) ([place]) 1997/1998    type 1, initially gestational but soon after child birth developed IDDM  . History of blood transfusion years ago     Past surgical hx, Family hx, Social hx all reviewed.  Current Outpatient Prescriptions on File Prior to Visit  Medication Sig  . acetaminophen (TYLENOL) 500 MG tablet Take 1,000 mg by mouth  every 6 (six) hours as needed for mild pain or fever. Reported on 07/22/2015  . beclomethasone (QVAR) 80 MCG/ACT inhaler Inhale 2 puffs into the lungs 2 (two) times daily.  . clonazePAM (KLONOPIN) 0.5 MG tablet Take 1 tablet (0.5 mg total) by mouth daily.  . cycloSPORINE (RESTASIS) 0.05 % ophthalmic emulsion Place 1 drop into both eyes 2 (two) times daily.  . fluticasone (FLONASE) 50 MCG/ACT nasal spray Place 2 sprays into both nostrils daily as needed for allergies.   Marland Kitchen gabapentin (NEURONTIN) 300 MG capsule Take 1 capsule (300 mg total) by mouth 3 (three) times daily.  . Insulin Glargine (LANTUS SOLOSTAR) 100 UNIT/ML Solostar Pen Inject 40 Units into the skin at bedtime.  Marland Kitchen lactulose (CHRONULAC) 10 GM/15ML solution Take 30 ml TID  . Linaclotide (LINZESS) 145 MCG CAPS capsule Take 1 capsule (145 mcg total) by mouth daily.  . naproxen (NAPROSYN) 500 MG tablet Take 1 tablet (500 mg total) by mouth 2 (two) times daily with a meal.  . NOVOLOG FLEXPEN 100 UNIT/ML FlexPen Inject 4-8 Units into the skin 3 (three) times daily.  . pantoprazole (PROTONIX) 40 MG tablet Take 1 tablet (40 mg total) by mouth daily at 6 (six) AM.  . PAZEO 0.7 % SOLN instill 1 drop into both eyes once daily  . potassium chloride SA (K-DUR,KLOR-CON) 20 MEQ tablet Take 1 tablet (20 mEq total) by mouth daily.  . predniSONE (DELTASONE) 10 MG tablet Take 4 tablets (40 mg total) by mouth daily with breakfast.  . propranolol (INDERAL) 80 MG tablet Take 1 tablet (80 mg total) by mouth 2 (two) times daily.  . cyclobenzaprine (FLEXERIL) 10 MG tablet Take 10 mg by mouth 3 (three) times daily as needed for muscle spasms. Reported on 11/29/2015  . meclizine (ANTIVERT) 25 MG tablet Take 12.5 mg by mouth 3 (three) times daily as needed for dizziness (take 1/2 to 1 tablet by mouth as needed for dizziness). Reported on 11/29/2015  . ondansetron (ZOFRAN) 4 MG tablet Take 1 tablet (4 mg total) by mouth every 6 (six) hours as needed for nausea or  vomiting. (Patient not taking: Reported on 11/29/2015)   No current facility-administered medications on file prior to visit.     Allergies  Allergen Reactions  . Contrast Media [Iodinated Diagnostic Agents] Anaphylaxis and Shortness Of Breath    Needed to be defibrillated and patient was pre-medicated with radiology 13 hour premeds.   . Augmentin [Amoxicillin-Pot Clavulanate] Other (See Comments)    "stomach hurt"  . Codeine Nausea And Vomiting    jittery  . Demerol [Meperidine]   . Iohexol      Code: RASH, Desc: PT STATES SHE IS ALLERGIC TO IV CONTRAST 05/28/06/RM, Onset Date: [id number]   . Morphine And Related Nausea And Vomiting    Review Of Systems:  Constitutional:   No  weight loss, night sweats,  Fevers, chills, fatigue, or  lassitude.  HEENT:   No headaches,  Difficulty swallowing,  Tooth/dental problems, or  Sore throat,                No sneezing, itching, ear ache,  nasal congestion, post nasal drip,   CV:  No chest pain,  Orthopnea, PND, swelling in lower extremities, anasarca, dizziness, palpitations, syncope.   GI  No heartburn, indigestion, abdominal pain, nausea, vomiting, diarrhea, change in bowel habits, loss of appetite, bloody stools.   Resp: No shortness of breath with exertion or at rest.  No excess mucus, no productive cough,  No non-productive cough,  No coughing up of blood.  No change in color of mucus.  No wheezing.  No chest wall deformity  Skin: no rash or lesions.  GU: no dysuria, change in color of urine, no urgency or frequency.  No flank pain, no hematuria   MS:  No joint pain or swelling.  No decreased range of motion.  No back pain.  Psych:  No change in mood or affect. No depression or anxiety.  No memory loss.   Vital Signs BP 144/82 mmHg  Pulse 80  Ht 5\' 6"  (1.676 m)  Wt 161 lb 6.4 oz (73.211 kg)  BMI 26.06 kg/m2  SpO2 95%  LMP 07/23/2014   Physical Exam:  General- No distress,  A&Ox3, ill appearing ENT: No sinus tenderness, TM  clear, pale nasal mucosa, no oral exudate,no post nasal drip, no LAN Cardiac: S1, S2, regular rate and rhythm, no murmur Chest: No wheeze/ rales/ dullness; no accessory muscle use, no nasal flaring, no sternal retractions Abd.: Soft Non-tender Ext: No clubbing cyanosis, edema Neuro:  normal strength Skin: No rashes, warm and dry Psych: normal mood and behavior   Assessment/Plan  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 When z-pack is finished, please start Bactrim 160 mg three times a week. Please take probiotic with antibiotics. Rest and hydrate. Try zantac 150 mg before bed for itching. You can also try calamine lotion. Decrease your prednisone to 30 mg daily until next visit with either Dr. Vaughan Browner or Dr. Havery Moros when they will guide you on further weaning. Labs in 1 month ( BMET) Follow up in 3 months with Dr. Vaughan Browner Please contact office for sooner follow up if symptoms do not improve or worsen or seek emergency care       Magdalen Spatz, NP 11/29/2015  2:24 PM   Attending note: I have seen and examined the patient with nurse practitioner/resident and agree with the note. History, labs and imaging reviewed.  Marshell Garfinkel MD [facility] Pager [phone] If no answer or after 3pm call: [phone] 12/03/2015, 1:50 PM

## 2015-11-29 NOTE — Assessment & Plan Note (Signed)
Sarcoid of the lung and Liver Prednisone 40 mg daily ( Until LFT's correct) Plan: We will prescribe a z-pack for your upper respiratory infection. When z-pack is finished, please start Bactrim 160 mg three times a week. Please take probiotic with antibiotics. Rest and hydrate. Try zantac 150 mg before bed for itching. You can also try calamine lotion. Decrease your prednisone to 30 mg daily until next visit with either Dr. Vaughan Browner or Dr. Havery Moros when they will guide you on further weaning. Labs in 1 month ( BMET) Follow up in 3 months with Dr. Vaughan Browner Please contact office for sooner follow up if symptoms do not improve or worsen or seek emergency care

## 2015-12-12 ENCOUNTER — Encounter: Payer: Self-pay | Admitting: Internal Medicine

## 2015-12-12 ENCOUNTER — Ambulatory Visit (INDEPENDENT_AMBULATORY_CARE_PROVIDER_SITE_OTHER): Payer: Medicaid Other | Admitting: Internal Medicine

## 2015-12-12 VITALS — BP 136/82 | HR 70 | Temp 98.0°F | Ht 66.0 in | Wt 165.2 lb

## 2015-12-12 DIAGNOSIS — Z794 Long term (current) use of insulin: Secondary | ICD-10-CM | POA: Diagnosis not present

## 2015-12-12 DIAGNOSIS — E118 Type 2 diabetes mellitus with unspecified complications: Secondary | ICD-10-CM | POA: Diagnosis not present

## 2015-12-12 MED ORDER — MECLIZINE HCL 25 MG PO TABS: 12.5000 mg | ORAL_TABLET | Freq: Three times a day (TID) | ORAL | 0 refills | Status: DC | PRN

## 2015-12-12 MED ORDER — ACCU-CHEK SOFTCLIX LANCETS MISC: 12 refills | Status: DC

## 2015-12-12 MED ORDER — ACCU-CHEK AVIVA PLUS W/DEVICE KIT: PACK | 0 refills | Status: DC

## 2015-12-12 MED ORDER — CYCLOBENZAPRINE HCL 10 MG PO TABS: 10.0000 mg | ORAL_TABLET | Freq: Three times a day (TID) | ORAL | 0 refills | Status: DC | PRN

## 2015-12-12 MED ORDER — ACCU-CHEK SOFTCLIX LANCET DEV MISC
0 refills | Status: DC
Start: 2015-12-12 — End: 2016-10-20

## 2015-12-12 MED ORDER — GLUCOSE BLOOD VI STRP: ORAL_STRIP | 12 refills | Status: DC

## 2015-12-12 MED ORDER — ONDANSETRON HCL 4 MG PO TABS: 4.0000 mg | ORAL_TABLET | Freq: Four times a day (QID) | ORAL | 0 refills | Status: DC | PRN

## 2015-12-12 NOTE — Progress Notes (Signed)
Subjective:    [name] - 45 y.o. female MRN [id number]  Date of birth: [birth year]  HPI  [name] is here for follow up of chronic medical conditions.   Diabetes mellitus, Type 2, Worsening Control  Lantus increased to 40u from 35u at last office visit. Currently on Prednisone therapy for Sarcoidosis.  Disease Monitoring  Meter stopped working and doesn't have money to get a new one. Has not checked in 9 days.  Blood Sugar Ranges: Fasting - 200s; Random - 200s-305. Polyuria: Yes  Visual problems: stable, reports h/o diabetic eye disease and needs prescription eye glasses   Urine Microalbumin Never completed, not on Ace or Arb therapy   Last A1C: 12.7 (May 2017)   Medication Compliance: yes  Medication Side Effects Hypoglycemia: no   Preventitive Health Care Eye Exam: Saw Dr. Einar Gip this year.  Foot Exam: UTD   Health Maintenance Due  Topic Date Due  . OPHTHALMOLOGY EXAM  10/13/1981  . URINE MICROALBUMIN  10/13/1981  . TETANUS/TDAP  10/14/1990  . PAP SMEAR  10/13/1992    -  reports that she has never smoked. She has never used smokeless tobacco. - Review of Systems: Per HPI. - Past Medical History: Patient Active Problem List   Diagnosis Date Noted  . Sarcoidosis ([place])   . Gait instability 02/12/2015  . Abnormal liver function tests 02/08/2015  . 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 Fall   . Chronic fatigue 01/01/2015  . Nausea without vomiting 12/11/2014  . Dyspnea 12/10/2014  . Hypokalemia 11/24/2014  . Pressure ulcer 11/23/2014  . Weight loss   . Nausea with vomiting   . Elevated alkaline phosphatase level   . Slow transit constipation   . Torsades de pointes ([place]) 11/22/2014  . Traumatic hematoma of head 11/22/2014  . Long QT interval 11/06/2014  . Cough 09/03/2011  . INTERNAL HEMORRHOIDS WITHOUT MENTION COMP 02/25/2010  . PSVT  02/24/2010  . PRECORDIAL PAIN 08/22/2009  . Palpitations 10/05/2008  . Automatic implantable cardioverter-defibrillator in situ 10/05/2008  . SEIZURE DISORDER 09/26/2008  . Constipation 02/08/2008  . ABDOMINAL PAIN, PERIUMBILICAL [id number]  . Sarcoidosis, stage 3 ([place]) 03/15/2007  . Diabetes mellitus, type 2 ([place]) 03/15/2007  . Essential hypertension 03/15/2007  . SYNDROME, LONG QT 03/15/2007  . ALLERGIC RHINITIS 03/15/2007  . OBSTRUCTIVE CHRONIC BRONCHITIS 03/15/2007  . GERD 03/15/2007  . ESOPHAGITIS 02/05/2004  . ESOPHAGEAL STRICTURE 02/05/2004   - Medications: reviewed and updated    Objective:   Physical Exam BP 136/82   Pulse 70   Temp 98 F (36.7 C) (Oral)   Ht 5\' 6"  (1.676 m)   Wt 165 lb 3.2 oz (74.9 kg)   LMP 07/23/2014   BMI 26.66 kg/m  Gen: alert, cooperative with exam, appears chronically ill but NAD  CV: RRR, good S1/S2, no murmur, no edema, capillary refill brisk  Resp: CTABL, no wheezes, non-labored  Assessment & Plan:   Diabetes mellitus, type 2 Uncontrolled with worsening A1C at last check. Patient reports compliance with Lantus 40 units and SSI with Novolog TID. She did not bring a blood sugar log to today's visit so unable to adjust insulin if needed.  -urine microalbumin ordered today, consider starting Ace therapy based on results  -continue Lantus and Novolog  -patient reports diabetic eye exam but no records in EMR, record request completed today  -new meter and supplies ordered for patient  -patient to return within 1 month with blood  sugar log     Phill Myron, D.O. 12/12/2015, 10:54 AM PGY-2, [place]

## 2015-12-12 NOTE — Assessment & Plan Note (Signed)
Uncontrolled with worsening A1C at last check. Patient reports compliance with Lantus 40 units and SSI with Novolog TID. She did not bring a blood sugar log to today's visit so unable to adjust insulin if needed.  -urine microalbumin ordered today, consider starting Ace therapy based on results  -continue Lantus and Novolog  -patient reports diabetic eye exam but no records in EMR, record request completed today  -new meter and supplies ordered for patient  -patient to return within 1 month with blood sugar log

## 2015-12-12 NOTE — Addendum Note (Signed)
Addended by: Derl Barrow on: 12/12/2015 11:33 AM   Modules accepted: Orders

## 2015-12-12 NOTE — Patient Instructions (Signed)
Please return within the month with a blood sugar log once you pick up your new meter that we have sent in.

## 2015-12-13 LAB — MICROALBUMIN / CREATININE URINE RATIO
Creatinine, Urine: 93 mg/dL (ref 20–320)
MICROALB/CREAT RATIO: 28 ug/mg{creat} (ref <30)
Microalb, Ur: 2.6 mg/dL

## 2015-12-17 ENCOUNTER — Ambulatory Visit (INDEPENDENT_AMBULATORY_CARE_PROVIDER_SITE_OTHER): Payer: Medicaid Other | Admitting: *Deleted

## 2015-12-17 DIAGNOSIS — I4581 Long QT syndrome: Secondary | ICD-10-CM

## 2015-12-17 NOTE — Progress Notes (Signed)
Remote ICD transmission.   

## 2015-12-24 ENCOUNTER — Encounter: Payer: Self-pay | Admitting: Cardiology

## 2016-01-01 LAB — CUP PACEART REMOTE DEVICE CHECK
Battery Remaining Longevity: 133 mo
Battery Voltage: 3.06 V
HighPow Impedance: 46 Ohm
HighPow Impedance: 70 Ohm
Implantable Lead Implant Date: 20080828
Implantable Lead Location: 753860
Implantable Lead Model: 6947
Lead Channel Impedance Value: 399 Ohm
Lead Channel Pacing Threshold Amplitude: 1.375 V
Lead Channel Pacing Threshold Pulse Width: 0.4 ms
Lead Channel Sensing Intrinsic Amplitude: 5 mV
Lead Channel Setting Pacing Amplitude: 3 V
Lead Channel Setting Sensing Sensitivity: 0.3 mV
MDC IDC MSMT LEADCHNL RV IMPEDANCE VALUE: 323 Ohm
MDC IDC MSMT LEADCHNL RV SENSING INTR AMPL: 5 mV
MDC IDC SESS DTM: 20170801042208
MDC IDC SET LEADCHNL RV PACING PULSEWIDTH: 0.4 ms
MDC IDC STAT BRADY RV PERCENT PACED: 0.01 %

## 2016-01-06 ENCOUNTER — Telehealth: Payer: Self-pay | Admitting: Gastroenterology

## 2016-01-07 ENCOUNTER — Other Ambulatory Visit: Payer: Self-pay

## 2016-01-07 NOTE — Telephone Encounter (Signed)
No answer

## 2016-01-09 NOTE — Telephone Encounter (Signed)
Got a voicemail this time. Left a message to call back if she is still having any problem with her nausea.

## 2016-01-10 ENCOUNTER — Ambulatory Visit: Payer: Medicaid Other | Admitting: Internal Medicine

## 2016-01-14 ENCOUNTER — Telehealth: Payer: Self-pay | Admitting: Gastroenterology

## 2016-01-14 MED ORDER — ONDANSETRON HCL 4 MG PO TABS: 4.0000 mg | ORAL_TABLET | Freq: Four times a day (QID) | ORAL | 1 refills | Status: DC | PRN

## 2016-01-14 NOTE — Telephone Encounter (Signed)
Patient requesting a refill of zofran.  She is having nausea.  This was previously prescribed by Dr. Deatra Ina.  Ok to send in refill?

## 2016-01-14 NOTE — Telephone Encounter (Signed)
I left a message for the patient that rx was sent.  She is asked to call back for any additional questions or concerns.

## 2016-01-14 NOTE — Telephone Encounter (Signed)
Yes that's fine, thanks

## 2016-01-23 ENCOUNTER — Ambulatory Visit (INDEPENDENT_AMBULATORY_CARE_PROVIDER_SITE_OTHER): Payer: Medicaid Other | Admitting: Gastroenterology

## 2016-01-23 ENCOUNTER — Encounter: Payer: Self-pay | Admitting: Gastroenterology

## 2016-01-23 ENCOUNTER — Other Ambulatory Visit (INDEPENDENT_AMBULATORY_CARE_PROVIDER_SITE_OTHER): Payer: Medicaid Other

## 2016-01-23 ENCOUNTER — Encounter (INDEPENDENT_AMBULATORY_CARE_PROVIDER_SITE_OTHER): Payer: Self-pay

## 2016-01-23 VITALS — BP 132/88 | HR 84 | Ht 66.0 in | Wt 166.6 lb

## 2016-01-23 DIAGNOSIS — D8689 Sarcoidosis of other sites: Secondary | ICD-10-CM

## 2016-01-23 DIAGNOSIS — R110 Nausea: Secondary | ICD-10-CM | POA: Diagnosis not present

## 2016-01-23 DIAGNOSIS — R519 Headache, unspecified: Secondary | ICD-10-CM

## 2016-01-23 DIAGNOSIS — D869 Sarcoidosis, unspecified: Secondary | ICD-10-CM

## 2016-01-23 DIAGNOSIS — R51 Headache: Secondary | ICD-10-CM | POA: Diagnosis not present

## 2016-01-23 DIAGNOSIS — Z23 Encounter for immunization: Secondary | ICD-10-CM | POA: Diagnosis not present

## 2016-01-23 LAB — HEPATIC FUNCTION PANEL
ALBUMIN: 4.1 g/dL (ref 3.5–5.2)
ALT: 25 U/L (ref 0–35)
AST: 21 U/L (ref 0–37)
Alkaline Phosphatase: 158 U/L — ABNORMAL HIGH (ref 39–117)
Bilirubin, Direct: 0.2 mg/dL (ref 0.0–0.3)
Total Bilirubin: 0.8 mg/dL (ref 0.2–1.2)
Total Protein: 7.9 g/dL (ref 6.0–8.3)

## 2016-01-23 NOTE — Progress Notes (Signed)
HPI :  45 y/o female patient with a  history of type 1 diabetes, long QT syndrome, status post ICD. SVT, status post ablation. 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 She previously had a [place] with elastography done otherwise showing a heterogenous liver without mass lesion, with elastography reported with F3-F4 score with high risk of fibrosis. She has had labs for chronic liver diseases which have been unremarkable other than elevated total IgG, with mild positive SMA, and negative ANA. 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 Portal pressures were normal.   She has been treated with a prolonged course of prednisone since her diagnosis, following with Dr. Kimber Relic of pulmonary. Her LAEs have significantly improved since being on steroids. AP peaked around 1047 in July 2016. Most recent AP was 207 about 2 months ago, with normal ALT. She continues to take prednisone 15m daily. She reports he breathing is stable. She is on bactrum three times per week for PCP prophylaxis. She had a URI a few months, leading to zpack use.   She otherwise endorses longstanding chronic nausea, she has been given some Zofran which has helped somewhat but it still bothers her. She takes it once per day. She is nauseated with most things she eats. She thinks her weight is stable. She is taking protonix once daily. She is taking Linzess for chronic constipatoin, which works well for her. She will have a BM once every 2 days. She takes lactulose three times per day if needed. No blood in the stools. She reports she does not take any NSAIDs. She endorses headaches frequently, anywhere from a few per week to a few per day.  She denies any new visual changes. No sensory or motor deficits she is aware of.   Prior workup: 10/24/14 Upper endoscopy: Normal, no varices noted 10/25/14  oral contrast only CT abdomen pelvis: Probable mild hepatosplenomegaly. Enlarged uterus containing large left leiomyoma.  12/03/14 Gastric emptying study normal.    Past Medical History:  Diagnosis Date  . Abdominal pain, periumbilic   . Allergic rhinitis   . Arthritis   . Asthma   . Automatic implantable cardioverter-defibrillator in situ 2006, replaced 2008   MEDTRONIC  . Bronchitis   . Cancer ([facility]    Nodules in lungs pt believes were cancerous pt unsure  . DM (diabetes mellitus) ([facility] 1997/1998   type 1, initially gestational but soon after child birth developed IDDM  . Esophageal stricture 2005/2009   esophageal strictures dilated 2005, 2009  . Eye hemorrhage    bilateral  . Fibroid, uterine   . Fibromyalgia   . GERD (gastroesophageal reflux disease) 2005  . History of blood transfusion years ago  . Internal hemorrhoids 2010   2011 band ligation of int rrhoids.  . Long Q-T syndrome   . Neuropathy in diabetes ([facility]   . Palpitation   . Sarcoidosis ([facility]   . Seizure disorder ([facility]    none recently  . Seizures ([facility]   . Stroke ([facility]    QUESTIONABLE TIA      Past Surgical History:  Procedure Laterality Date  . AUTOMATIC IMPLANTABLE CARDIAC DEFIBRILLATOR SITU  2006,    ICD-Medtronic   Remote - Yes   . CERVIX REMOVAL    . DILITATION & CURRETTAGE/HYSTROSCOPY WITH HYDROTHERMAL ABLATION N/A 06/07/2013   Procedure: DILATATION & CURETTAGE/HYSTEROSCOPY WITH attempted HYDROTHERMAL ABLATION;  Surgeon: Frederico Hamman, MD;  Location: [facility];  Service: Gynecology;  Laterality: N/A;  . EP IMPLANTABLE DEVICE N/A 04/04/2015   Procedure:  ICD Generator Changeout;  Surgeon: Evans Lance, MD;  Location: [facility];  Service: Cardiovascular;  Laterality: N/A;  . ESOPHAGOGASTRODUODENOSCOPY (EGD) WITH PROPOFOL N/A 10/24/2014   Procedure: ESOPHAGOGASTRODUODENOSCOPY (EGD) WITH PROPOFOL;  Surgeon: Inda Castle, MD;  Location: [facility];  Service: Endoscopy;  Laterality:  N/A;  . EXPLORATORY LAPAROTOMY  11/06/14   evacuation of post TAH/BSO hematoma  . HAND SURGERY Right    x 2  . HEMORRHOID BANDING  03/2010  . SALPINGECTOMY    . SVT ablation    . TOTAL ABDOMINAL HYSTERECTOMY W/ BILATERAL SALPINGOOPHORECTOMY  11/06/14   at [facility]. for menorrhagia, pelvic pain and enlarging uterine fibroids  . TUBAL LIGATION    . VIDEO BRONCHOSCOPY Bilateral 01/24/2015   Procedure: VIDEO BRONCHOSCOPY WITHOUT FLUORO;  Surgeon: Marshell Garfinkel, MD;  Location: [facility];  Service: Cardiopulmonary;  Laterality: Bilateral;   Family History  Problem Relation Age of Onset  . Lung cancer Maternal Uncle     x2  . Breast cancer Maternal Aunt     x2  . Stomach cancer Paternal Uncle   . Stomach cancer Paternal Grandfather   . Stomach cancer Paternal Grandmother   . Colon cancer Neg Hx    Social History  Substance Use Topics  . Smoking status: Never Smoker  . Smokeless tobacco: Never Used  . Alcohol use 0.0 oz/week     Comment: occ.   Current Outpatient Prescriptions  Medication Sig Dispense Refill  . ACCU-CHEK SOFTCLIX LANCETS lancets Test Blood Sugar twice a day.  ICD-10 code E11.9. 100 each 12  . acetaminophen (TYLENOL) 500 MG tablet Take 1,000 mg by mouth every 6 (six) hours as needed for mild pain or fever. Reported on 07/22/2015    . beclomethasone (QVAR) 80 MCG/ACT inhaler Inhale 2 puffs into the lungs 2 (two) times daily. 1 Inhaler 5  . Blood Glucose Monitoring Suppl (ACCU-CHEK AVIVA PLUS) w/Device KIT Test blood sugar once fasting and once ICD-10  Code  E11.9 1 kit 0  . cyclobenzaprine (FLEXERIL) 10 MG tablet Take 1 tablet (10 mg total) by mouth 3 (three) times daily as needed for muscle spasms. Reported on 11/29/2015 30 tablet 0  . cycloSPORINE (RESTASIS) 0.05 % ophthalmic emulsion Place 1 drop into both eyes 2 (two) times daily. 0.4 mL 0  . fluticasone (FLONASE) 50 MCG/ACT nasal spray Place 2 sprays into both nostrils daily as needed for allergies.     Marland Kitchen gabapentin  (NEURONTIN) 300 MG capsule Take 1 capsule (300 mg total) by mouth 3 (three) times daily. 90 capsule 2  . glucose blood (ACCU-CHEK AVIVA PLUS) test strip Test Blood Sugar twice a day. ICD-10 code E11.9 100 each 12  . ibuprofen (ADVIL,MOTRIN) 600 MG tablet Take 1 tablet by mouth every 8 (eight) hours as needed.  0  . Insulin Glargine (LANTUS SOLOSTAR) 100 UNIT/ML Solostar Pen Inject 40 Units into the skin at bedtime. 30 mL 0  . lactulose (CHRONULAC) 10 GM/15ML solution Take 30 ml TID 2700 mL 2  . Lancet Devices (ACCU-CHEK SOFTCLIX) lancets Test Blood Sugar twice a day. ICD-10 code E11.9. 1 each 0  . Linaclotide (LINZESS) 145 MCG CAPS capsule Take 1 capsule (145 mcg total) by mouth daily. 30 capsule 3  . meclizine (ANTIVERT) 25 MG tablet Take 0.5 tablets (12.5 mg total) by mouth 3 (three) times daily  as needed for dizziness. 30 tablet 0  . NOVOLOG FLEXPEN 100 UNIT/ML FlexPen Inject 4-8 Units into the skin 3 (three) times daily. 15 mL 0  . ondansetron (ZOFRAN) 4 MG tablet Take 1 tablet (4 mg total) by mouth every 6 (six) hours as needed for nausea or vomiting. 60 tablet 1  . pantoprazole (PROTONIX) 40 MG tablet Take 1 tablet (40 mg total) by mouth daily at 6 (six) AM. 30 tablet 2  . PAZEO 0.7 % SOLN instill 1 drop into both eyes once daily  0  . potassium chloride SA (K-DUR,KLOR-CON) 20 MEQ tablet Take 1 tablet (20 mEq total) by mouth daily. 30 tablet 2  . predniSONE (DELTASONE) 10 MG tablet Take 4 tablets (40 mg total) by mouth daily with breakfast. 120 tablet 3  . propranolol (INDERAL) 80 MG tablet Take 1 tablet (80 mg total) by mouth 2 (two) times daily. 30 tablet 1  . sulfamethoxazole-trimethoprim (BACTRIM DS) 800-160 MG tablet Take 1 tablet by mouth 3 (three) times a week. 12 tablet 5   No current facility-administered medications for this visit.    Allergies  Allergen Reactions  . Contrast Media [Iodinated Diagnostic Agents] Anaphylaxis and Shortness Of Breath    Needed to be defibrillated  and patient was pre-medicated with radiology 13 hour premeds.   . Augmentin [Amoxicillin-Pot Clavulanate] Other (See Comments)    "stomach hurt"  . Codeine Nausea And Vomiting    jittery  . Demerol [Meperidine]   . Iohexol      Code: RASH, Desc: PT STATES SHE IS ALLERGIC TO IV CONTRAST 05/28/06/RM, Onset Date: [id number]   . Morphine And Related Nausea And Vomiting     Review of Systems: All systems reviewed and negative except where noted in HPI.    Lab Results  Component Value Date   ALT 24 11/25/2015   AST 23 11/25/2015   ALKPHOS 207 (H) 11/25/2015   BILITOT 0.9 11/25/2015      Physical Exam: BP 132/88   Pulse 84   Ht _0  (1.676 m)   Wt 166 lb 9.6 oz (75.6 kg)   LMP 07/23/2014   BMI 26.89 kg/m  Constitutional: Pleasant,well-developed, female in no acute distress. HEENT: Normocephalic and atraumatic. Conjunctivae are normal. No scleral icterus. Neck supple.  Cardiovascular: Normal rate, regular rhythm.  Pulmonary/chest: Effort normal and breath sounds normal. No wheezing, rales or rhonchi. Abdominal: Soft, nondistended, nontender. Bowel sounds active throughout. There are no masses palpable. No hepatomegaly. Extremities: no edema Lymphadenopathy: No cervical adenopathy noted. Neurological: Alert and oriented to person place and time. Skin: Skin is warm and dry. No rashes noted. Psychiatric: Normal mood and affect. Behavior is normal.   ASSESSMENT AND PLAN: 45 y/o female with extensive history as outlined above, presenting for reassessment of the following:  Hepatic / pulmonary sarcoidosis - confirmed with liver biopsy, no evidence of cirrhosis or portal hypertension to date. Her AP peaked at over 1000 last summer, and has downtrended nicely with prednisone, last around 200 in July, with normal ALT. Will recheck LAEs today. If they have normalized will discuss slow taper with Dr. Kimber Relic, pending if her pulmonary status is able to tolerate it. She has been on this  dose for almost 6 months. Otherwise she was not immune to hep A / B and was referred for vaccination previously. Regarding long term steroid use she should take daily vitamin D / calcium supplement, and will follow up with primary care regarding DEXA.  Chronic nausea / dyspepsia /  headaches - prior workup with EGD, CT scans, gastric emptying study unremarkable. Using low dose Zofran PRN which helps. She otherwise reports chronic headaches which bother her frequently. She has no obvious neurologic deficits however neurologic sarcoid could present with chronic nausea / headaches. Ideally MRI brain would be considered but she cannot have an MRI due to her ICD. I discussed her case with radiology who recommended CT head with and without contrast in this light. Otherwise discussed other options such as Reglan and phenergan which she declined, wishes to minimize medications given prolonged QT. She can try FDgard to see if this helps, as well as trial of vitamin B6 supplement at 13m day   SCarolina Cellar MD [facility] Pager [phone]

## 2016-01-23 NOTE — Patient Instructions (Signed)
Your physician has requested that you go to the basement for the following lab work before leaving today:  LFTs  We will call you after Dr. Havery Moros has a conversation with radiology

## 2016-02-03 ENCOUNTER — Other Ambulatory Visit: Payer: Self-pay

## 2016-02-03 DIAGNOSIS — R7989 Other specified abnormal findings of blood chemistry: Secondary | ICD-10-CM

## 2016-02-03 DIAGNOSIS — R945 Abnormal results of liver function studies: Secondary | ICD-10-CM

## 2016-02-05 ENCOUNTER — Telehealth: Payer: Self-pay | Admitting: Gastroenterology

## 2016-02-05 DIAGNOSIS — R519 Headache, unspecified: Secondary | ICD-10-CM

## 2016-02-05 DIAGNOSIS — R110 Nausea: Secondary | ICD-10-CM

## 2016-02-05 DIAGNOSIS — R51 Headache: Secondary | ICD-10-CM

## 2016-02-05 NOTE — Telephone Encounter (Signed)
Yes. I had initially wanted an MRI brain ordered but she cannot have this (ICD in place, not MRI compatible). Radiology has recommended a CT of the head with and without contrast. Can you please order this for her? Thanks

## 2016-02-05 NOTE — Telephone Encounter (Signed)
Dr. Havery Moros, were you going to order additional imaging on her?

## 2016-02-05 NOTE — Telephone Encounter (Signed)
Patient with anaphylaxis to IVP dye - per patient she required resuscitation.  Discussed with Dr. Havery Moros patient needs IV contrast if she is willing to have CT and radiologist will perform with known allergy.  Erline Levine at [facility] CT contacted, can't have at [facility] must be scheduled at the hospital with prednisone protocol and permission of the radiologist.  Patient notified of all the above and she wishes to hold off for now.  She will discuss with her family and call back if she wants to proceed.

## 2016-02-10 ENCOUNTER — Telehealth: Payer: Self-pay | Admitting: Gastroenterology

## 2016-02-10 NOTE — Telephone Encounter (Signed)
Left message to call back  

## 2016-02-10 NOTE — Telephone Encounter (Signed)
I spoke with the patient. She is very scared to do the CT with contrast. She states "I died last time". She asks to be hospitalized. She also asks if any conversation with Dr Lovena Le has occurred. She feels he should be included in the conversation.

## 2016-02-11 ENCOUNTER — Other Ambulatory Visit: Payer: Self-pay

## 2016-02-11 DIAGNOSIS — R519 Headache, unspecified: Secondary | ICD-10-CM

## 2016-02-11 DIAGNOSIS — R51 Headache: Principal | ICD-10-CM

## 2016-02-11 DIAGNOSIS — R112 Nausea with vomiting, unspecified: Secondary | ICD-10-CM

## 2016-02-11 NOTE — Telephone Encounter (Signed)
Cardiologist is Dr Crissie Sickles at [phone]. I relayed the information about why CT contrast is needed. She has not stated her decision. Still scared of this.

## 2016-02-11 NOTE — Telephone Encounter (Signed)
I spoke with the radiologist previously about her case. To evaluate for neurosarcoidosis, she would need either an MRI brain or CT head with contrast. She cannot have an MRI due to ICD and has a severe allergy to contrast. I don't think a noncontrast CT would be able to fully rule this out. Is Dr. Lovena Le her primary care or another subspecialist, I am happy to discuss with him if I can have his information. Thanks

## 2016-02-11 NOTE — Telephone Encounter (Signed)
Beth can you please let her know that I don't think Dr. Lovena Le would be her point of contact for this issue, this is not a cardiology issue. I think if she is continuing to have headaches frequently this needs to be addressed and could be related to her nausea -  If she wishes she can see her primary care for her headaches or she can be referred to Neurology for further evaluation if she has not seen them yet. Thanks

## 2016-02-11 NOTE — Telephone Encounter (Signed)
Discussed other options with the patient. She is agreeable to seeing neurology due to the complexity of this issue. A referral is made to Neurology.

## 2016-02-13 NOTE — Progress Notes (Signed)
This encounter was created in error - please disregard.

## 2016-02-18 ENCOUNTER — Ambulatory Visit (INDEPENDENT_AMBULATORY_CARE_PROVIDER_SITE_OTHER): Payer: Medicaid Other | Admitting: Podiatry

## 2016-02-18 ENCOUNTER — Encounter: Payer: Self-pay | Admitting: Podiatry

## 2016-02-18 VITALS — BP 127/87 | HR 95 | Resp 16

## 2016-02-18 DIAGNOSIS — L608 Other nail disorders: Secondary | ICD-10-CM

## 2016-02-18 DIAGNOSIS — E0842 Diabetes mellitus due to underlying condition with diabetic polyneuropathy: Secondary | ICD-10-CM | POA: Diagnosis not present

## 2016-02-18 NOTE — Progress Notes (Signed)
   Subjective:    Patient ID: [name], female    DOB: Dec 06, 1970, 45 y.o.   MRN: [id number]  HPI    Review of Systems  Musculoskeletal: Positive for arthralgias and back pain.  All other systems reviewed and are negative.      Objective:   Physical Exam        Assessment & Plan:

## 2016-02-18 NOTE — Progress Notes (Signed)
Patient ID: [name], female   DOB: [birth year], 45 y.o.   MRN: [id number]    Subjective: This patient presents today requesting toenail debridement. Also in the past 4-5 days she complaining of discomfort in the distal right hallux which is uncomfortable with direct shoe pressure and to direct palpation. She recalls one previous history of this problem     Physical Exam  Orientated 3  Vascular: No peripheral edema bilaterally DP pulses 2/4 bilaterally PT pulses 2/4 bilaterally Capillary reflex immediate bilaterally  Neurological: Sensation to 10 g monofilament wire intact 3/5 bilaterally Vibratory sensation reactive bilaterally Ankle reflex equal and reactive bilaterally  Dermatological: No open skin lesions bilaterally The toenails elongated, incurvated 6-10 The distal right hallux nail has some dried blood at the very distal aspect of the toe beneath the nail and after debridement to dry blood is totally removed and there is a small corn-like growth in the area which was debrided. There is no erythema, edema, drainage surrounding this area Musculoskeletal: Patient has unstable gait using cane HAV deformity left greater than right This note restriction ankle, subtalar, midtarsal joints bilaterally  Assessment: Diabetic with peripheral neuropathy Gait disturbance Incurvated toenails 6-10 Keratoses distal right hallux without a clinical sign of infection  Plan: Today reviewed the results of exam with patient today and offered nail debridement and she verbally consents. The toenails 6-10 are debrided mechanically at elective without any bleeding The distal right nail was debrided without any bleeding. Topical antibiotic ointment applied to the area. Patient advised to observe the areas for any sign of increased pain, swelling, redness and present to the emergency department or contact our office.  Reappoint 3 months  Reappoint 3 months

## 2016-02-18 NOTE — Patient Instructions (Signed)
At the end of the right toe at the edge of the nail and skin there were some dried blood and a corn in that area. I trim that area out and put some topical antibiotic ointment. No drainage or warmth noted. Apply topical antibiotic ointment and Band-Aid until comfortable. If she knows any sudden increased pain, swelling, redness present to emergency department or contact us  Diabetes and Foot Care Diabetes may cause you to have problems because of poor blood supply (circulation) to your feet and legs. This may cause the skin on your feet to become thinner, break easier, and heal more slowly. Your skin may become dry, and the skin may peel and crack. You may also have nerve damage in your legs and feet causing decreased feeling in them. You may not notice minor injuries to your feet that could lead to infections or more serious problems. Taking care of your feet is one of the most important things you can do for yourself.  HOME CARE INSTRUCTIONS  Wear shoes at all times, even in the house. Do not go barefoot. Bare feet are easily injured.  Check your feet daily for blisters, cuts, and redness. If you cannot see the bottom of your feet, use a mirror or ask someone for help.  Wash your feet with warm water (do not use hot water) and mild soap. Then pat your feet and the areas between your toes until they are completely dry. Do not soak your feet as this can dry your skin.  Apply a moisturizing lotion or petroleum jelly (that does not contain alcohol and is unscented) to the skin on your feet and to dry, brittle toenails. Do not apply lotion between your toes.  Trim your toenails straight across. Do not dig under them or around the cuticle. File the edges of your nails with an emery board or nail file.  Do not cut corns or calluses or try to remove them with medicine.  Wear clean socks or stockings every day. Make sure they are not too tight. Do not wear knee-high stockings since they may decrease blood  flow to your legs.  Wear shoes that fit properly and have enough cushioning. To break in new shoes, wear them for just a few hours a day. This prevents you from injuring your feet. Always look in your shoes before you put them on to be sure there are no objects inside.  Do not cross your legs. This may decrease the blood flow to your feet.  If you find a minor scrape, cut, or break in the skin on your feet, keep it and the skin around it clean and dry. These areas may be cleansed with mild soap and water. Do not cleanse the area with peroxide, alcohol, or iodine.  When you remove an adhesive bandage, be sure not to damage the skin around it.  If you have a wound, look at it several times a day to make sure it is healing.  Do not use heating pads or hot water bottles. They may burn your skin. If you have lost feeling in your feet or legs, you may not know it is happening until it is too late.  Make sure your health care provider performs a complete foot exam at least annually or more often if you have foot problems. Report any cuts, sores, or bruises to your health care provider immediately. SEEK MEDICAL CARE IF:   You have an injury that is not healing.  You have  cuts or breaks in the skin.  You have an ingrown nail.  You notice redness on your legs or feet.  You feel burning or tingling in your legs or feet.  You have pain or cramps in your legs and feet.  Your legs or feet are numb.  Your feet always feel cold. SEEK IMMEDIATE MEDICAL CARE IF:   There is increasing redness, swelling, or pain in or around a wound.  There is a red line that goes up your leg.  Pus is coming from a wound.  You develop a fever or as directed by your health care provider.  You notice a bad smell coming from an ulcer or wound.   This information is not intended to replace advice given to you by your health care provider. Make sure you discuss any questions you have with your health care  provider.   Document Released: 05/01/2000 Document Revised: 01/04/2013 Document Reviewed: 10/11/2012 Elsevier Interactive Patient Education [employer].

## 2016-02-20 ENCOUNTER — Other Ambulatory Visit: Payer: Self-pay

## 2016-02-20 ENCOUNTER — Telehealth: Payer: Self-pay | Admitting: Gastroenterology

## 2016-02-20 DIAGNOSIS — R51 Headache: Principal | ICD-10-CM

## 2016-02-20 DIAGNOSIS — D869 Sarcoidosis, unspecified: Secondary | ICD-10-CM

## 2016-02-20 DIAGNOSIS — R519 Headache, unspecified: Secondary | ICD-10-CM

## 2016-02-20 NOTE — Telephone Encounter (Signed)
Message routed to  [facility]. jf

## 2016-02-20 NOTE — Telephone Encounter (Signed)
Left the patient a message. Neurology has her referral. I have not put the referral in correctly. Neurologist is reviewing. Dr Havery Moros is aware.

## 2016-02-24 ENCOUNTER — Telehealth: Payer: Self-pay | Admitting: Gastroenterology

## 2016-02-25 ENCOUNTER — Other Ambulatory Visit: Payer: Self-pay

## 2016-02-25 DIAGNOSIS — R51 Headache: Principal | ICD-10-CM

## 2016-02-25 DIAGNOSIS — R519 Headache, unspecified: Secondary | ICD-10-CM

## 2016-02-25 NOTE — Telephone Encounter (Signed)
Scheduled for 05/06/16 with Dr Alda Lea. Please advise.

## 2016-02-25 NOTE — Telephone Encounter (Signed)
I am happy to refer her to any Neurologist in the area. Is there another in [place] with a shorter wait time?

## 2016-02-25 NOTE — Telephone Encounter (Signed)
Left message for patient to call. I put through a referral to [facility] in [place]. She is scheduled for 03/05/16 @ 9:30, to arrive at 9:00. There phone number is [phone].

## 2016-03-02 ENCOUNTER — Encounter: Payer: Self-pay | Admitting: Pulmonary Disease

## 2016-03-02 ENCOUNTER — Ambulatory Visit (INDEPENDENT_AMBULATORY_CARE_PROVIDER_SITE_OTHER): Payer: Medicaid Other | Admitting: Pulmonary Disease

## 2016-03-02 VITALS — BP 130/70 | HR 96 | Ht 66.5 in | Wt 166.8 lb

## 2016-03-02 DIAGNOSIS — D862 Sarcoidosis of lung with sarcoidosis of lymph nodes: Secondary | ICD-10-CM | POA: Diagnosis not present

## 2016-03-02 DIAGNOSIS — R0600 Dyspnea, unspecified: Secondary | ICD-10-CM | POA: Diagnosis not present

## 2016-03-02 NOTE — Patient Instructions (Signed)
Continue using the prednisone at 30 mg. We will review your neurologist assessment for neurosarcoid before making any changes to the dose.  Return in 1 month

## 2016-03-02 NOTE — Progress Notes (Signed)
[name]    [id number]    [birth year]  Primary Care Physician:Catherine Danley Danker, DO  Referring Physician: Nicolette Bang, DO [address] 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 Asthmatic bronchitis Chronic cough Ventricular tachycardia s/p AICD. Long QT syndrome Left pleural effusion May 07, 2015. Improved>> Thoracentesis >>exudative   HPI: [name] is 45 year old with stage III sarcoidosis, asthmatic bronchitis, chronic cough. She is a former patient of Dr. Joya Gaskins. She was diagnosed around 2003. Is not clear if she had a tissue diagnosis. There is record of a transbronchial biopsy in 2003 but no granulomas were seen. She had been on and off systemic prednisone. She was admitted in September 2016 with sarcoidosis flare. Bronchial with BAL showed 10K staph aureus which was thought to be a contaminant. She was given Solu-Medrol 60 q6 and then discharged on a slow prednisone taper starting at 60 mg. She is also being followed by cardiology for prolonged QT syndrome, family history of sudden death. Her ICD was changed in May 07, 2015.   She underwent a thoracentesis in November 16 which showed exudative left-sided predominant effusion, and cultures were negative. Repeat CXR showed improvement in her effusion.   Interim History: She she had a liver biopsy early 2017 that showed hepatic sarcoidosis with elevated LFTs. She's been on a prolonged steroid taper with good response and her last LFTs have normalized. We reduced her prednisone to 30 mg in July with a plan to continue the slow taper of prednisone. She is currently complaining of generalized fatigue, body ache, headaches. She has been referred to neurology for evaluation of neurologic sarcoidosis. She does not have any specific pulmonary complaints. She denies any cough, sputum production, wheeze, dyspnea.  Outpatient  Encounter Prescriptions as of 03/02/2016  Medication Sig  . ACCU-CHEK SOFTCLIX LANCETS lancets Test Blood Sugar twice a day.  ICD-10 code E11.9.  Marland Kitchen acetaminophen (TYLENOL) 500 MG tablet Take 1,000 mg by mouth every 6 (six) hours as needed for mild pain or fever. Reported on 07/22/2015  . beclomethasone (QVAR) 80 MCG/ACT inhaler Inhale 2 puffs into the lungs 2 (two) times daily.  . Blood Glucose Monitoring Suppl (ACCU-CHEK AVIVA PLUS) w/Device KIT Test blood sugar once fasting and once ICD-10  Code  E11.9  . cyclobenzaprine (FLEXERIL) 10 MG tablet Take 1 tablet (10 mg total) by mouth 3 (three) times daily as needed for muscle spasms. Reported on 11/29/2015  . cycloSPORINE (RESTASIS) 0.05 % ophthalmic emulsion Place 1 drop into both eyes 2 (two) times daily.  . fluticasone (FLONASE) 50 MCG/ACT nasal spray Place 2 sprays into both nostrils daily as needed for allergies.   Marland Kitchen gabapentin (NEURONTIN) 300 MG capsule Take 1 capsule (300 mg total) by mouth 3 (three) times daily.  Marland Kitchen glucose blood (ACCU-CHEK AVIVA PLUS) test strip Test Blood Sugar twice a day. ICD-10 code E11.9  . ibuprofen (ADVIL,MOTRIN) 600 MG tablet Take 1 tablet by mouth every 8 (eight) hours as needed.  . Insulin Glargine (LANTUS SOLOSTAR) 100 UNIT/ML Solostar Pen Inject 40 Units into the skin at bedtime.  Marland Kitchen lactulose (CHRONULAC) 10 GM/15ML solution Take 30 ml TID  . Lancet Devices (ACCU-CHEK SOFTCLIX) lancets Test Blood Sugar twice a day. ICD-10 code E11.9.  . Linaclotide (LINZESS) 145 MCG CAPS capsule Take 1 capsule (145 mcg total) by mouth daily.  . meclizine (ANTIVERT) 25 MG tablet Take 0.5 tablets (12.5 mg total)  by mouth 3 (three) times daily as needed for dizziness.  Marland Kitchen NOVOLOG FLEXPEN 100 UNIT/ML FlexPen Inject 4-8 Units into the skin 3 (three) times daily.  . ondansetron (ZOFRAN) 4 MG tablet Take 1 tablet (4 mg total) by mouth every 6 (six) hours as needed for nausea or vomiting.  . pantoprazole (PROTONIX) 40 MG tablet Take 1  tablet (40 mg total) by mouth daily at 6 (six) AM.  . PAZEO 0.7 % SOLN instill 1 drop into both eyes once daily  . potassium chloride SA (K-DUR,KLOR-CON) 20 MEQ tablet Take 1 tablet (20 mEq total) by mouth daily.  . predniSONE (DELTASONE) 10 MG tablet Take 30 mg by mouth daily with breakfast.  . propranolol (INDERAL) 80 MG tablet Take 1 tablet (80 mg total) by mouth 2 (two) times daily.  Marland Kitchen sulfamethoxazole-trimethoprim (BACTRIM DS) 800-160 MG tablet Take 1 tablet by mouth 3 (three) times a week.  . [DISCONTINUED] predniSONE (DELTASONE) 10 MG tablet Take 4 tablets (40 mg total) by mouth daily with breakfast. (Patient taking differently: Take 30 mg by mouth daily with breakfast. )   No facility-administered encounter medications on file as of 03/02/2016.     Allergies as of 03/02/2016 - Review Complete 03/02/2016  Allergen Reaction Noted  . Contrast media [iodinated diagnostic agents] Anaphylaxis and Shortness Of Breath 05/30/2013  . Augmentin [amoxicillin-pot clavulanate] Other (See Comments) 10/23/2014  . Codeine Nausea And Vomiting   . Demerol [meperidine]  07/22/2015  . Iohexol  05/28/2006  . Morphine and related Nausea And Vomiting 05/30/2013    Past Medical History:  Diagnosis Date  . Abdominal pain, periumbilic   . Allergic rhinitis   . Arthritis   . Asthma   . Automatic implantable cardioverter-defibrillator in situ 2006, replaced 2008   [employer]  . Bronchitis   . Cancer ([place])    Nodules in lungs pt believes were cancerous pt unsure  . DM (diabetes mellitus) ([place]) 1997/1998   type 1, initially gestational but soon after child birth developed IDDM  . Esophageal stricture 2005/2009   esophageal strictures dilated 2005, 2009  . Eye hemorrhage    bilateral  . Fibroid, uterine   . Fibromyalgia   . GERD (gastroesophageal reflux disease) 2005  . History of blood transfusion years ago  . Internal hemorrhoids 2010   2011 band ligation of int rrhoids.  . Long Q-T syndrome    . Neuropathy in diabetes ([place])   . Palpitation   . Sarcoidosis ([place])   . Seizure disorder ([place])    none recently  . Seizures ([place])   . Stroke ([place])    QUESTIONABLE TIA     Past Surgical History:  Procedure Laterality Date  . AUTOMATIC IMPLANTABLE CARDIAC DEFIBRILLATOR SITU  2006,    ICD-Medtronic   Remote - Yes   . CERVIX REMOVAL    . DILITATION & CURRETTAGE/HYSTROSCOPY WITH HYDROTHERMAL ABLATION N/A 06/07/2013   Procedure: DILATATION & CURETTAGE/HYSTEROSCOPY WITH attempted HYDROTHERMAL ABLATION;  Surgeon: Frederico Hamman, MD;  Location: [facility];  Service: Gynecology;  Laterality: N/A;  . EP IMPLANTABLE DEVICE N/A 04/04/2015   Procedure:  ICD Generator Changeout;  Surgeon: Evans Lance, MD;  Location: [facility];  Service: Cardiovascular;  Laterality: N/A;  . ESOPHAGOGASTRODUODENOSCOPY (EGD) WITH PROPOFOL N/A 10/24/2014   Procedure: ESOPHAGOGASTRODUODENOSCOPY (EGD) WITH PROPOFOL;  Surgeon: Inda Castle, MD;  Location: [facility];  Service: Endoscopy;  Laterality: N/A;  . EXPLORATORY LAPAROTOMY  11/06/14   evacuation of post TAH/BSO hematoma  . HAND  SURGERY Right    x 2  . HEMORRHOID BANDING  03/2010  . SALPINGECTOMY    . SVT ablation    . TOTAL ABDOMINAL HYSTERECTOMY W/ BILATERAL SALPINGOOPHORECTOMY  11/06/14   at [facility]. for menorrhagia, pelvic pain and enlarging uterine fibroids  . TUBAL LIGATION    . VIDEO BRONCHOSCOPY Bilateral 01/24/2015   Procedure: VIDEO BRONCHOSCOPY WITHOUT FLUORO;  Surgeon: Marshell Garfinkel, MD;  Location: [facility];  Service: Cardiopulmonary;  Laterality: Bilateral;    Family History  Problem Relation Age of Onset  . Lung cancer Maternal Uncle     x2  . Breast cancer Maternal Aunt     x2  . Stomach cancer Paternal Uncle   . Stomach cancer Paternal Grandfather   . Stomach cancer Paternal Grandmother   . Colon cancer Neg Hx     Social History   Social History  . Marital status: Single    Spouse name: N/A  . Number of children:  N/A  . Years of education: N/A   Occupational History  . Not on file.   Social History Main Topics  . Smoking status: Never Smoker  . Smokeless tobacco: Never Used  . Alcohol use 0.0 oz/week     Comment: occ.  . Drug use: No  . Sexual activity: No   Other Topics Concern  . Not on file   Social History Narrative  . No narrative on file     Review of systems: Review of Systems  Constitutional: Negative for fever and chills.  HENT: Negative.   Eyes: Negative for blurred vision.  Respiratory: as per HPI  Cardiovascular: Negative for chest pain and palpitations.  Gastrointestinal: Negative for vomiting, diarrhea, blood per rectum. Genitourinary: Negative for dysuria, urgency, frequency and hematuria.  Musculoskeletal: Negative for myalgias, back pain and joint pain.  Skin: Negative for itching and rash.  Neurological: Negative for dizziness, tremors, focal weakness, seizures and loss of consciousness.  Endo/Heme/Allergies: Negative for environmental allergies.  Psychiatric/Behavioral: Negative for depression, suicidal ideas and hallucinations.  All other systems reviewed and are negative.   Physical Exam: Blood pressure 130/70, pulse 96, height 5' 6.5" (1.689 m), weight 166 lb 12.8 oz (75.7 kg), last menstrual period 07/23/2014, SpO2 97 %. Gen:      No acute distress HEENT:  EOMI, sclera anicteric Neck:     No masses; no thyromegaly Lungs:    Clear to auscultation bilaterally; normal respiratory effort CV:         Regular rate and rhythm; no murmurs Abd:      + bowel sounds; soft, non-tender; no palpable masses, no distension Ext:    No edema; adequate peripheral perfusion Skin:      Warm and dry; no rash Neuro: alert and oriented x 3 Psych: normal mood and affect  Data Reviewed: PFTs (08/29/14) FVC 2.98 (85%) FEV1 2.42 (85%) F/F 81  IMAGING: CT scan (01/21/15) 1. There is a spectrum of findings in the thorax that is most likely related to the patient's history  of sarcoidosis. Specifically, there is a perilymphatic distribution of multiple tiny subcentimeter pulmonary nodules, in addition to mediastinal lymphadenopathy. 2. Dilatation of the pulmonic trunk (3.6 cm in diameter), suggestive of pulmonary arterial hypertension. 3. Splenomegaly. 4. Probable hepatomegaly.  CXR (03/03/15) Moderate left pleural effusion. Adjacent left lower lobe atelectasis versus infection. Possible lingular airspace disease as well. New enlargement of the cardiopericardial silhouette, which could relate to cardiomegaly and/or pericardial effusion.  CXR (06/13/15) Cardiomegaly, mild CHF, improved left effusion  LABS:  Echo 01/28/15 EF 60-65%, PAP 36.  ACE level 01/22/15 - 177.   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  Assessment:  #1 Sarcoidosis, stage III She continues on prednisone at 30 mg for now. She has tolerated the recent reduction in dose in July 2017. I have discussed her case with Dr. Havery Moros. Since her LFTs are better and there is no clear evidence of flareup of her pulmonary sarcoid our plan is to continue this taper.However I'll hold off further reduction in dose since she is scheduled to see [facility] neurology for evaluation of neurosarcoid. If neurosarcoid is confirmed then we may have to reevaluate her immunosuppression with aggressive care. However if there is no evidence of neurosarcoid I will reduce the dose further to 20 mg. She continues on OTC vitamin D supplementation and PCP prophylaxis with Bactrim.  #2 Asthmatic bronchitis. She is doing well on the Qvar inhaler. I'll continue the same.  #3 Cough She does have chronic nonproductive cough.She is already on Flonase nasal spray for rhinitis and Protonix for GERD.    Plan/Recommendations: - Continue prednisone at 30 mg for now. -  Revaluate prednisone dose after neruro assessment - Continue Ca, Vit and bactrim    Marshell Garfinkel MD [facility] Pager [phone] 03/02/2016, 9:26 AM  CC: Laqueta Due MD

## 2016-03-05 ENCOUNTER — Ambulatory Visit (INDEPENDENT_AMBULATORY_CARE_PROVIDER_SITE_OTHER): Payer: Medicaid Other | Admitting: Neurology

## 2016-03-05 ENCOUNTER — Encounter: Payer: Self-pay | Admitting: Neurology

## 2016-03-05 VITALS — BP 120/74 | HR 82 | Resp 16 | Ht 66.5 in | Wt 169.0 lb

## 2016-03-05 DIAGNOSIS — G40A09 Absence epileptic syndrome, not intractable, without status epilepticus: Secondary | ICD-10-CM

## 2016-03-05 DIAGNOSIS — G4489 Other headache syndrome: Secondary | ICD-10-CM | POA: Insufficient documentation

## 2016-03-05 DIAGNOSIS — D860 Sarcoidosis of lung: Secondary | ICD-10-CM

## 2016-03-05 DIAGNOSIS — D869 Sarcoidosis, unspecified: Secondary | ICD-10-CM

## 2016-03-05 DIAGNOSIS — M5481 Occipital neuralgia: Secondary | ICD-10-CM | POA: Diagnosis not present

## 2016-03-05 DIAGNOSIS — R269 Unspecified abnormalities of gait and mobility: Secondary | ICD-10-CM

## 2016-03-05 DIAGNOSIS — R9431 Abnormal electrocardiogram [ECG] [EKG]: Secondary | ICD-10-CM

## 2016-03-05 NOTE — Progress Notes (Signed)
[facility]  PATIENT: [name] DOB: [birth year]  REFERRING DOCTOR OR PCP:  PCP Phill Myron; GI Dr. Vaughan Browner; Pulm Dr. Havery Moros SOURCE: patient, note from PCP., GI and Pulmonary, CT report, labs, CT images on PACS  _________________________________   HISTORICAL  CHIEF COMPLAINT:  Chief Complaint  Patient presents with  . Headache    [name] is here for eval of h/a's onset yrs. ago, worse in the last couple of mos.  Sts. she is having 2-3 h/a's per week, sometimes assiciated with n/v, light and sound sensitivity. Sts. no relief with otc Advil, Tylenol/fim    HISTORY OF PRESENT ILLNESS:  I had the pleasure seeing you patient, [name], at [facility] for neurologic consultation regarding her headaches. 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      Currently, she is experiencing headaches 2-3 days a week. They began to occur on a more regular basis about 2-3 months ago. Of note, when she was younger she did get intermittent migraine headaches but these had improved for many years. About 12 years ago she saw neurology, Dr. Jacolyn Reedy, for headaches and possible seizures.    She was placed on clonazepam.  She gets a headache, it is always on the left side and pounding. It is worse in the forehead but is seems to sometimes start at the side or back of the head and radiate forward. When she gets the headache she will have nausea, occasional vomiting, photophobia and phonophobia. Moving her head will worsen the pain. Laying down in a cold quiet dark room will sometimes help.     She was diagnosed with seizures 12-15 years ago based on episodes of memory loss and decreased responsiveness for a minute or so.   She reports an EEG was abnormal.    Seizures are also in uncles x 2, aunt, maternal grandmother and some grand-aunts.   As a teenager, she also began to have some  myoclonic episodes in the morning. She continues to have these though they are not as common as it used to be. He had a generalized tonic-clonic seizure.   She is not currently on any anti-epilepsy medication.   She has had several episodes of phasing out for 30 seconds or so, sometimes recognized by others.     Noncontrasted CT scan 11/22/2014 showed brain was normal.   It was done after she passed out and hit her head.   She has an AICD and has not had an MRI of the brain.  She had a long hospitalization to 2016 due to an lung involvement of the sarcoidosis. Since that time she has felt that her walking has been poor and she uses a cane for balance.  Reviewed notes from Dr. Juleen China, Dr. Vaughan Browner (GI) and from Dr. Havery Moros (pulmonologist).   She has stage III sarcoidosis with pulmonary and hepatic involvement. He has had pleural effusions. She also has long QT syndrome and had ventricular tachycardia and is status post an AICD.  Personally reviewed a CT scan of the head without from 11/22/2014. The brain was normal. That was performed after a head injury and she did have a small subcutaneous hematoma.       REVIEW OF SYSTEMS: Constitutional: No fevers, chills, sweats, or change in appetite Eyes: No visual changes, double vision, eye pain Ear, nose and throat: No hearing loss, ear pain, nasal congestion, sore throat Cardiovascular: No chest pain, palpitations  Respiratory: No shortness of breath at rest or with exertion.   No wheezes GastrointestinaI: No nausea, vomiting, diarrhea, abdominal pain, fecal incontinence Genitourinary: No dysuria, urinary retention or frequency.  No nocturia. Musculoskeletal: No neck pain, back pain Integumentary: No rash, pruritus, skin lesions Neurological: as above Psychiatric: No depression at this time.  No anxiety Endocrine: No palpitations, diaphoresis, change in appetite, change in weigh or increased thirst Hematologic/Lymphatic: No anemia, purpura,  petechiae. Allergic/Immunologic: No itchy/runny eyes, nasal congestion, recent allergic reactions, rashes  ALLERGIES: Allergies  Allergen Reactions  . Contrast Media [Iodinated Diagnostic Agents] Anaphylaxis and Shortness Of Breath    Needed to be defibrillated and patient was pre-medicated with radiology 13 hour premeds.   . Augmentin [Amoxicillin-Pot Clavulanate] Other (See Comments)    "stomach hurt"  . Codeine Nausea And Vomiting    jittery  . Demerol [Meperidine]   . Iohexol      Code: RASH, Desc: PT STATES SHE IS ALLERGIC TO IV CONTRAST 05/28/06/RM, Onset Date: [id number]   . Morphine And Related Nausea And Vomiting    HOME MEDICATIONS:  Current Outpatient Prescriptions:  .  ACCU-CHEK SOFTCLIX LANCETS lancets, Test Blood Sugar twice a day.  ICD-10 code E11.9., Disp: 100 each, Rfl: 12 .  acetaminophen (TYLENOL) 500 MG tablet, Take 1,000 mg by mouth every 6 (six) hours as needed for mild pain or fever. Reported on 07/22/2015, Disp: , Rfl:  .  beclomethasone (QVAR) 80 MCG/ACT inhaler, Inhale 2 puffs into the lungs 2 (two) times daily., Disp: 1 Inhaler, Rfl: 5 .  Blood Glucose Monitoring Suppl (ACCU-CHEK AVIVA PLUS) w/Device KIT, Test blood sugar once fasting and once ICD-10  Code  E11.9, Disp: 1 kit, Rfl: 0 .  cyclobenzaprine (FLEXERIL) 10 MG tablet, Take 1 tablet (10 mg total) by mouth 3 (three) times daily as needed for muscle spasms. Reported on 11/29/2015, Disp: 30 tablet, Rfl: 0 .  cycloSPORINE (RESTASIS) 0.05 % ophthalmic emulsion, Place 1 drop into both eyes 2 (two) times daily., Disp: 0.4 mL, Rfl: 0 .  fluticasone (FLONASE) 50 MCG/ACT nasal spray, Place 2 sprays into both nostrils daily as needed for allergies. , Disp: , Rfl:  .  gabapentin (NEURONTIN) 300 MG capsule, Take 1 capsule (300 mg total) by mouth 3 (three) times daily., Disp: 90 capsule, Rfl: 2 .  glucose blood (ACCU-CHEK AVIVA PLUS) test strip, Test Blood Sugar twice a day. ICD-10 code E11.9, Disp: 100 each, Rfl: 12 .   ibuprofen (ADVIL,MOTRIN) 600 MG tablet, Take 1 tablet by mouth every 8 (eight) hours as needed., Disp: , Rfl: 0 .  Insulin Glargine (LANTUS SOLOSTAR) 100 UNIT/ML Solostar Pen, Inject 40 Units into the skin at bedtime., Disp: 30 mL, Rfl: 0 .  lactulose (CHRONULAC) 10 GM/15ML solution, Take 30 ml TID, Disp: 2700 mL, Rfl: 2 .  Lancet Devices (ACCU-CHEK SOFTCLIX) lancets, Test Blood Sugar twice a day. ICD-10 code E11.9., Disp: 1 each, Rfl: 0 .  Linaclotide (LINZESS) 145 MCG CAPS capsule, Take 1 capsule (145 mcg total) by mouth daily., Disp: 30 capsule, Rfl: 3 .  meclizine (ANTIVERT) 25 MG tablet, Take 0.5 tablets (12.5 mg total) by mouth 3 (three) times daily as needed for dizziness., Disp: 30 tablet, Rfl: 0 .  NOVOLOG FLEXPEN 100 UNIT/ML FlexPen, Inject 4-8 Units into the skin 3 (three) times daily., Disp: 15 mL, Rfl: 0 .  ondansetron (ZOFRAN) 4 MG tablet, Take 1 tablet (4 mg total) by mouth every 6 (six) hours as needed for nausea or vomiting., Disp: 60  tablet, Rfl: 1 .  pantoprazole (PROTONIX) 40 MG tablet, Take 1 tablet (40 mg total) by mouth daily at 6 (six) AM., Disp: 30 tablet, Rfl: 2 .  PAZEO 0.7 % SOLN, instill 1 drop into both eyes once daily, Disp: , Rfl: 0 .  potassium chloride SA (K-DUR,KLOR-CON) 20 MEQ tablet, Take 1 tablet (20 mEq total) by mouth daily., Disp: 30 tablet, Rfl: 2 .  predniSONE (DELTASONE) 10 MG tablet, Take 30 mg by mouth daily with breakfast., Disp: , Rfl:  .  propranolol (INDERAL) 80 MG tablet, Take 1 tablet (80 mg total) by mouth 2 (two) times daily., Disp: 30 tablet, Rfl: 1 .  sulfamethoxazole-trimethoprim (BACTRIM DS) 800-160 MG tablet, Take 1 tablet by mouth 3 (three) times a week., Disp: 12 tablet, Rfl: 5  PAST MEDICAL HISTORY: Past Medical History:  Diagnosis Date  . Abdominal pain, periumbilic   . Allergic rhinitis   . Arthritis   . Asthma   . Automatic implantable cardioverter-defibrillator in situ 2006, replaced 2008   MEDTRONIC  . Bronchitis   . Cancer  ([place])    Nodules in lungs pt believes were cancerous pt unsure  . DM (diabetes mellitus) ([place]) 1997/1998   type 1, initially gestational but soon after child birth developed IDDM  . Esophageal stricture 2005/2009   esophageal strictures dilated 2005, 2009  . Eye hemorrhage    bilateral  . Fibroid, uterine   . Fibromyalgia   . GERD (gastroesophageal reflux disease) 2005  . History of blood transfusion years ago  . Internal hemorrhoids 2010   2011 band ligation of int rrhoids.  . Long Q-T syndrome   . Neuropathy in diabetes ([place])   . Palpitation   . Sarcoidosis ([place])   . Seizure disorder ([place])    none recently  . Seizures ([place])   . Stroke ([place])    QUESTIONABLE TIA     PAST SURGICAL HISTORY: Past Surgical History:  Procedure Laterality Date  . AUTOMATIC IMPLANTABLE CARDIAC DEFIBRILLATOR SITU  2006,    ICD-Medtronic   Remote - Yes   . CERVIX REMOVAL    . DILITATION & CURRETTAGE/HYSTROSCOPY WITH HYDROTHERMAL ABLATION N/A 06/07/2013   Procedure: DILATATION & CURETTAGE/HYSTEROSCOPY WITH attempted HYDROTHERMAL ABLATION;  Surgeon: Frederico Hamman, MD;  Location: [facility];  Service: Gynecology;  Laterality: N/A;  . EP IMPLANTABLE DEVICE N/A 04/04/2015   Procedure:  ICD Generator Changeout;  Surgeon: Evans Lance, MD;  Location: [place];  Service: Cardiovascular;  Laterality: N/A;  . ESOPHAGOGASTRODUODENOSCOPY (EGD) WITH PROPOFOL N/A 10/24/2014   Procedure: ESOPHAGOGASTRODUODENOSCOPY (EGD) WITH PROPOFOL;  Surgeon: Inda Castle, MD;  Location: [facility];  Service: Endoscopy;  Laterality: N/A;  . EXPLORATORY LAPAROTOMY  11/06/14   evacuation of post TAH/BSO hematoma  . HAND SURGERY Right    x 2  . HEMORRHOID BANDING  03/2010  . SALPINGECTOMY    . SVT ablation    . TOTAL ABDOMINAL HYSTERECTOMY W/ BILATERAL SALPINGOOPHORECTOMY  11/06/14   at [facility]. for menorrhagia, pelvic pain and enlarging uterine fibroids  . TUBAL LIGATION    . VIDEO BRONCHOSCOPY Bilateral 01/24/2015    Procedure: VIDEO BRONCHOSCOPY WITHOUT FLUORO;  Surgeon: Marshell Garfinkel, MD;  Location: [place];  Service: Cardiopulmonary;  Laterality: Bilateral;    FAMILY HISTORY: Family History  Problem Relation Age of Onset  . Heart attack Mother   . Lung cancer Maternal Uncle     x2  . Breast cancer Maternal Aunt     x2  . Stomach  cancer Paternal Uncle   . Stomach cancer Paternal Grandfather   . Stomach cancer Paternal Grandmother   . Diabetes Father   . Positive PPD/TB Exposure Father   . Glaucoma Father   . Colon cancer Neg Hx     SOCIAL HISTORY:  Social History   Social History  . Marital status: Single    Spouse name: N/A  . Number of children: N/A  . Years of education: N/A   Occupational History  . Not on file.   Social History Main Topics  . Smoking status: Never Smoker  . Smokeless tobacco: Never Used  . Alcohol use 0.0 oz/week     Comment: occ.  . Drug use: No  . Sexual activity: No   Other Topics Concern  . Not on file   Social History Narrative  . No narrative on file     PHYSICAL EXAM  Vitals:   03/05/16 0947  BP: 120/74  Pulse: 82  Resp: 16  Weight: 169 lb (76.7 kg)  Height: 5' 6.5" (1.689 m)    Body mass index is 26.87 kg/m.   General: The patient is well-developed and well-nourished and in no acute distress  Eyes:  Funduscopic exam shows normal optic discs and retinal vessels.  Neck: The neck is supple, no carotid bruits are noted.  The neck is very tender at the left occiput and pain radiates forward.  Cardiovascular: The heart has a regular rate and rhythm with a normal S1 and S2. There were no murmurs, gallops or rubs. Lungs are clear to auscultation.  Skin: Extremities are without significant edema.  Musculoskeletal:  Back is nontender  Neurologic Exam  Mental status: The patient is alert and oriented x 3 at the time of the examination. The patient has apparent normal recent and remote memory, with an apparently normal  attention span and concentration ability.   Speech is normal.  Cranial nerves: Extraocular movements are full. Pupils are equal, round, and reactive to light and accomodation.  Visual fields are full.  Facial symmetry is present. There is good facial sensation to soft touch bilaterally.Facial strength is normal.  Trapezius and sternocleidomastoid strength is normal. No dysarthria is noted.  The tongue is midline, and the patient has symmetric elevation of the soft palate. No obvious hearing deficits are noted.  Motor:  Muscle bulk is normal.   Tone is normal. Strength is  5 / 5 in all 4 extremities.   Sensory: Sensory testing is intact to pinprick, soft touch and vibration sensation in arms, hands and proximal legs.   She has reduced vibration and touch sensation in her toes.  Coordination: Cerebellar testing reveals good finger-nose-finger and heel-to-shin bilaterally.  Gait and station: Station is normal.   Gait is mildly wide and tandem is wide. Romberg is negative.   Reflexes: Deep tendon reflexes are symmetric and normal bilaterally.   Plantar responses are flexor.    DIAGNOSTIC DATA (LABS, IMAGING, TESTING) - I reviewed patient records, labs, notes, testing and imaging myself where available.  Lab Results  Component Value Date   WBC 4.3 08/02/2015   HGB 14.8 08/02/2015   HCT 41.9 08/02/2015   MCV 83.6 08/02/2015   PLT 335 08/02/2015      Component Value Date/Time   NA 133 (L) 06/17/2015 1145   K 4.4 06/17/2015 1145   CL 96 06/17/2015 1145   CO2 24 06/17/2015 1145   GLUCOSE 398 (H) 06/17/2015 1145   BUN 10 06/17/2015 1145   CREATININE 0.56  06/17/2015 1145   CREATININE 0.41 (L) 03/28/2015 0848   CALCIUM 10.2 06/17/2015 1145   PROT 7.9 01/23/2016 1226   ALBUMIN 4.1 01/23/2016 1226   AST 21 01/23/2016 1226   ALT 25 01/23/2016 1226   ALKPHOS 158 (H) 01/23/2016 1226   BILITOT 0.8 01/23/2016 1226   GFRNONAA >60 03/03/2015 1148   GFRAA >60 03/03/2015 1148   Lab Results    Component Value Date   CHOL 166 03/29/2015   HDL 14 (L) 12/24/2014   LDLCALC 61 12/24/2014   TRIG 236 (H) 12/24/2014   CHOLHDL 8.7 (H) 12/24/2014   Lab Results  Component Value Date   HGBA1C 12.7 10/04/2015   No results found for: VITAMINB12 Lab Results  Component Value Date   TSH 1.16 06/17/2015       ASSESSMENT AND PLAN  Sarcoidosis, stage 3 (HCC)  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 She is very tender over the left occiput, over the splenius capitis muscle and the occipital nerve. I did an left occipital nerve block with 60 mg Depo-Medrol in 3 mL Marcaine. Pain was improved a few minutes later. Hopefully, this will greatly help the headache frequency and severity. If it does not, I would consider a prophylactic agent, but we'll need to make sure that it would not affect her cardiac or liver issues. She also has a history of seizures with staring spells starting when she was young but no convulsions. There is a strong family history of staring spells. 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 We will check an EEG to better determine treatment. I'll try to pick a medication that will help the headaches as well as seizures.   I don't have a good expiration for her gait disturbance. 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   Due to her AICD,  MRI of the brain and spine would be difficult and carry risk.   However if symptoms worsen we may need to consider this though I would need input from cardiology and make sure that the AICD turned down before and reset afterwards.  She will return to see me in 6-8 weeks or sooner if there are new or worsening  neurologic symptoms.  Thank you for asking me to see [name]. Please let me know if I can be of further assistance with her or other patients in the future.   Imran Nuon A. Felecia Shelling, MD, PhD 31/49/7026, 37:85 AM Certified in Neurology, Clinical Neurophysiology, Sleep Medicine, Pain Medicine and Neuroimaging  [facility] [address], [place], [place] [address] [phone]

## 2016-03-10 ENCOUNTER — Encounter: Payer: Self-pay | Admitting: Internal Medicine

## 2016-03-13 ENCOUNTER — Telehealth: Payer: Self-pay | Admitting: Gastroenterology

## 2016-03-13 ENCOUNTER — Other Ambulatory Visit: Payer: Self-pay

## 2016-03-13 NOTE — Telephone Encounter (Signed)
Given she has prolonged QT interval wouldn't recommend Reglan. She can use low dose Zofran 4mg  q8-12 H prn. Advise patient to drink fluids every 15-20 mins small sips to maintain hydration, if she is unable to tolerate any PO intake, may need to go to ER for IV hydration and control of symptoms.

## 2016-03-13 NOTE — Telephone Encounter (Signed)
Patient called she reports that today she woke up with nausea, vomited 1 x, took her Zofran @ 9:15 and is still nauseated. Also reports diarrhea, green colored. Denies fever or abdominal pain, only discomfort. She reports her BS at 308, takes lactulose but has not had it today. Has had her Protonix and her insulin. Patient also reports that one time earlier this week had blood in her stool. Please advise.

## 2016-03-13 NOTE — Telephone Encounter (Signed)
Spoke to patient she will continue to try to drink fluids, advised if unable to tolerate then go to ER for evaluation and symptom managed. Patient understands to also continue her Zofran 4 mg q8-12 hours prn.

## 2016-03-20 ENCOUNTER — Encounter: Payer: Self-pay | Admitting: Internal Medicine

## 2016-03-20 ENCOUNTER — Ambulatory Visit (INDEPENDENT_AMBULATORY_CARE_PROVIDER_SITE_OTHER): Payer: Medicaid Other | Admitting: Internal Medicine

## 2016-03-20 ENCOUNTER — Ambulatory Visit (INDEPENDENT_AMBULATORY_CARE_PROVIDER_SITE_OTHER): Payer: Medicaid Other | Admitting: Neurology

## 2016-03-20 VITALS — BP 190/100 | HR 76 | Ht 66.5 in | Wt 170.4 lb

## 2016-03-20 DIAGNOSIS — Z95810 Presence of automatic (implantable) cardiac defibrillator: Secondary | ICD-10-CM | POA: Diagnosis not present

## 2016-03-20 DIAGNOSIS — G40B09 Juvenile myoclonic epilepsy, not intractable, without status epilepticus: Secondary | ICD-10-CM

## 2016-03-20 DIAGNOSIS — I4581 Long QT syndrome: Secondary | ICD-10-CM

## 2016-03-20 DIAGNOSIS — G40A09 Absence epileptic syndrome, not intractable, without status epilepticus: Secondary | ICD-10-CM | POA: Diagnosis not present

## 2016-03-20 MED ORDER — PROPRANOLOL HCL 80 MG PO TABS: 80.0000 mg | ORAL_TABLET | Freq: Two times a day (BID) | ORAL | 11 refills | Status: DC

## 2016-03-20 NOTE — Patient Instructions (Addendum)
Medication Instructions:  Your physician recommends that you continue on your current medications as directed. Please refer to the Current Medication list given to you today.   Labwork: None Ordered   Testing/Procedures: None Ordered   Follow-Up: Your physician wants you to follow-up in: 1 year with Dr. Taylor. You will receive a reminder letter in the mail two months in advance. If you don't receive a letter, please call our office to schedule the follow-up appointment.  Remote monitoring is used to monitor your ICD from home. This monitoring reduces the number of office visits required to check your device to one time per year. It allows us to keep an eye on the functioning of your device to ensure it is working properly. You are scheduled for a device check from home on 06/22/16. You may send your transmission at any time that day. If you have a wireless device, the transmission will be sent automatically. After your physician reviews your transmission, you will receive a postcard with your next transmission date.    Any Other Special Instructions Will Be Listed Below (If Applicable).     If you need a refill on your cardiac medications before your next appointment, please call your pharmacy.   

## 2016-03-20 NOTE — Progress Notes (Signed)
HPI [name] returns today for followup. She is a pleasant 45year-old woman with a history of long QT syndrome and a very strong family history of sudden cardiac death, status post ICD implantation. She also has a history of SVT and is status post catheter ablation. She has had VF and received appropriate ICD shocks in the past. She underwent ICD generator change out 16 months ago. She has had no additional VT and no ICD shocks.  She has been seen by pulmonary for her sarcoid and had her dose of prednisone adjusted. She has begun to gain some weight back.  Allergies  Allergen Reactions  . Contrast Media [Iodinated Diagnostic Agents] Anaphylaxis and Shortness Of Breath    Needed to be defibrillated and patient was pre-medicated with radiology 13 hour premeds.   . Augmentin [Amoxicillin-Pot Clavulanate] Other (See Comments)    "stomach hurt"  . Codeine Nausea And Vomiting    jittery  . Demerol [Meperidine]   . Iohexol      Code: RASH, Desc: PT STATES SHE IS ALLERGIC TO IV CONTRAST 05/28/06/RM, Onset Date: [id number]   . Morphine And Related Nausea And Vomiting     Current Outpatient Prescriptions  Medication Sig Dispense Refill  . ACCU-CHEK SOFTCLIX LANCETS lancets Test Blood Sugar twice a day.  ICD-10 code E11.9. 100 each 12  . acetaminophen (TYLENOL) 500 MG tablet Take 1,000 mg by mouth every 6 (six) hours as needed for mild pain or fever. Reported on 07/22/2015    . beclomethasone (QVAR) 80 MCG/ACT inhaler Inhale 2 puffs into the lungs 2 (two) times daily. 1 Inhaler 5  . Blood Glucose Monitoring Suppl (ACCU-CHEK AVIVA PLUS) w/Device KIT Test blood sugar once fasting and once ICD-10  Code  E11.9 1 kit 0  . cyclobenzaprine (FLEXERIL) 10 MG tablet Take 1 tablet (10 mg total) by mouth 3 (three) times daily as needed for muscle spasms. Reported on 11/29/2015 30 tablet 0  . cycloSPORINE (RESTASIS) 0.05 % ophthalmic emulsion Place 1 drop into both eyes 2 (two) times daily. 0.4 mL 0  . fluticasone  (FLONASE) 50 MCG/ACT nasal spray Place 2 sprays into both nostrils daily as needed for allergies.     Marland Kitchen gabapentin (NEURONTIN) 300 MG capsule Take 1 capsule (300 mg total) by mouth 3 (three) times daily. 90 capsule 2  . glucose blood (ACCU-CHEK AVIVA PLUS) test strip Test Blood Sugar twice a day. ICD-10 code E11.9 100 each 12  . ibuprofen (ADVIL,MOTRIN) 600 MG tablet Take 1 tablet by mouth every 8 (eight) hours as needed.  0  . Insulin Glargine (LANTUS SOLOSTAR) 100 UNIT/ML Solostar Pen Inject 40 Units into the skin at bedtime. 30 mL 0  . lactulose (CHRONULAC) 10 GM/15ML solution Take 30 ml TID 2700 mL 2  . Lancet Devices (ACCU-CHEK SOFTCLIX) lancets Test Blood Sugar twice a day. ICD-10 code E11.9. 1 each 0  . Linaclotide (LINZESS) 145 MCG CAPS capsule Take 1 capsule (145 mcg total) by mouth daily. 30 capsule 3  . meclizine (ANTIVERT) 25 MG tablet Take 0.5 tablets (12.5 mg total) by mouth 3 (three) times daily as needed for dizziness. 30 tablet 0  . NOVOLOG FLEXPEN 100 UNIT/ML FlexPen Inject 4-8 Units into the skin 3 (three) times daily. 15 mL 0  . ondansetron (ZOFRAN) 4 MG tablet Take 1 tablet (4 mg total) by mouth every 6 (six) hours as needed for nausea or vomiting. 60 tablet 1  . pantoprazole (PROTONIX) 40 MG tablet Take 1 tablet (40 mg total)  by mouth daily at 6 (six) AM. 30 tablet 2  . PAZEO 0.7 % SOLN instill 1 drop into both eyes once daily  0  . potassium chloride SA (K-DUR,KLOR-CON) 20 MEQ tablet Take 1 tablet (20 mEq total) by mouth daily. 30 tablet 2  . predniSONE (DELTASONE) 10 MG tablet Take 30 mg by mouth daily with breakfast.    . propranolol (INDERAL) 80 MG tablet Take 1 tablet (80 mg total) by mouth 2 (two) times daily. 30 tablet 1  . sulfamethoxazole-trimethoprim (BACTRIM DS) 800-160 MG tablet Take 1 tablet by mouth 3 (three) times a week. 12 tablet 5   No current facility-administered medications for this visit.      Past Medical History:  Diagnosis Date  . Abdominal pain,  periumbilic   . Allergic rhinitis   . Arthritis   . Asthma   . Automatic implantable cardioverter-defibrillator in situ 2006, replaced 2008   MEDTRONIC  . Bronchitis   . Cancer (HCC)    Nodules in lungs pt believes were cancerous pt unsure  . DM (diabetes mellitus) (HCC) 1997/1998   type 1, initially gestational but soon after child birth developed IDDM  . Esophageal stricture 2005/2009   esophageal strictures dilated 2005, 2009  . Eye hemorrhage    bilateral  . Fibroid, uterine   . Fibromyalgia   . GERD (gastroesophageal reflux disease) 2005  . History of blood transfusion years ago  . Internal hemorrhoids 2010   2011 band ligation of int rrhoids.  . Long Q-T syndrome   . Neuropathy in diabetes (HCC)   . Palpitation   . Sarcoidosis (HCC)   . Seizure disorder (HCC)    none recently  . Seizures (HCC)   . Stroke (HCC)    QUESTIONABLE TIA     ROS:   All systems reviewed and negative except as noted in the HPI.   Past Surgical History:  Procedure Laterality Date  . AUTOMATIC IMPLANTABLE CARDIAC DEFIBRILLATOR SITU  2006,    ICD-Medtronic   Remote - Yes   . CERVIX REMOVAL    . DILITATION & CURRETTAGE/HYSTROSCOPY WITH HYDROTHERMAL ABLATION N/A 06/07/2013   Procedure: DILATATION & CURETTAGE/HYSTEROSCOPY WITH attempted HYDROTHERMAL ABLATION;  Surgeon: Bernard A Marshall, MD;  Location: [facility];  Service: Gynecology;  Laterality: N/A;  . EP IMPLANTABLE DEVICE N/A 04/04/2015   Procedure:  ICD Generator Changeout;  Surgeon: Gregg W Taylor, MD;  Location: [facility];  Service: Cardiovascular;  Laterality: N/A;  . ESOPHAGOGASTRODUODENOSCOPY (EGD) WITH PROPOFOL N/A 10/24/2014   Procedure: ESOPHAGOGASTRODUODENOSCOPY (EGD) WITH PROPOFOL;  Surgeon: Robert D Kaplan, MD;  Location: [facility];  Service: Endoscopy;  Laterality: N/A;  . EXPLORATORY LAPAROTOMY  11/06/14   evacuation of post TAH/BSO hematoma  . HAND SURGERY Right    x 2  . HEMORRHOID BANDING  03/2010  .  SALPINGECTOMY    . SVT ablation    . TOTAL ABDOMINAL HYSTERECTOMY W/ BILATERAL SALPINGOOPHORECTOMY  11/06/14   at [facility]. for menorrhagia, pelvic pain and enlarging uterine fibroids  . TUBAL LIGATION    . VIDEO BRONCHOSCOPY Bilateral 01/24/2015   Procedure: VIDEO BRONCHOSCOPY WITHOUT FLUORO;  Surgeon: Praveen Mannam, MD;  Location: [facility];  Service: Cardiopulmonary;  Laterality: Bilateral;     Family History  Problem Relation Age of Onset  . Heart attack Mother   . Lung cancer Maternal Uncle     x2  . Breast cancer Maternal Aunt     x2  . Stomach cancer Paternal Uncle   .   Stomach cancer Paternal Grandfather   . Stomach cancer Paternal Grandmother   . Diabetes Father   . Positive PPD/TB Exposure Father   . Glaucoma Father   . Colon cancer Neg Hx      Social History   Social History  . Marital status: Single    Spouse name: N/A  . Number of children: N/A  . Years of education: N/A   Occupational History  . Not on file.   Social History Main Topics  . Smoking status: Never Smoker  . Smokeless tobacco: Never Used  . Alcohol use 0.0 oz/week     Comment: occ.  . Drug use: No  . Sexual activity: No   Other Topics Concern  . Not on file   Social History Narrative  . No narrative on file     BP (!) 190/100   Pulse 76   Ht 5' 6.5" (1.689 m)   Wt 170 lb 6.4 oz (77.3 kg)   LMP 07/23/2014   SpO2 99%   BMI 27.09 kg/m  BP 150/90 by me Physical Exam:  stable appearing 45 year old woman, NAD HEENT: Unremarkable Neck:  6 cm JVD, no thyromegally Back:  No CVA tenderness Lungs:  Clear with no wheezes, rales, or rhonchi. HEART:  Regular rate rhythm, no murmurs, no rubs, no clicks Abd:  soft, positive bowel sounds, no organomegally, no rebound, no guarding Ext:  2 plus pulses, no edema, no cyanosis, no clubbing Skin:  No rashes no nodules Neuro:  CN II through XII intact, motor grossly intact  ECG - nsr with right axis and IRBBB  DEVICE  Normal device  function.  See PaceArt for details.   Assess/Plan: 1. Long QT - she will continue her propranolol 2. Chronic diastolic heart failure - her EF is normal by echo a year ago and her symptoms are stable. She is encouraged to maintain a low sodium diet. 3. ICD - her medtronic ICD is working normally. 4. Sarcodosis - her symptoms are better. She is still on fairly high dose predisone. No obvious cardiac involvement based on her last echo.  Nicol Herbig,M.D. 1. Long QT - she is stable. She is taking Doxycycline. I do not see any worsening QT prolongation on ECG show this should be ok. 2. VF/PMVT - she has had no additional arrhythmias. No change in meds. She will continue her beta blocker. 3. Weight loss - it appears to have stabilized. Etiology remains obscure. Could all be do to sarcoid?  Mikle Bosworth.D.

## 2016-03-23 ENCOUNTER — Other Ambulatory Visit: Payer: Self-pay | Admitting: Neurology

## 2016-03-23 DIAGNOSIS — G40B09 Juvenile myoclonic epilepsy, not intractable, without status epilepticus: Secondary | ICD-10-CM | POA: Insufficient documentation

## 2016-03-23 MED ORDER — LAMOTRIGINE 50 MG PO TBDP: ORAL_TABLET | ORAL | 11 refills | Status: DC

## 2016-03-23 NOTE — Progress Notes (Signed)
   [facility]  EEG (ELECTROENCEPHALOGRAM) REPORT   STUDY DATE: 03/20/2016 PATIENT NAME: [name] DOB: [birth year] MRN: [id number]  ORDERING CLINICIAN: Jeanne Diefendorf A. Felecia Shelling, MD. PhD  TECHNOLOGIST: Laretta Alstrom TECHNIQUE: Electroencephalogram was recorded utilizing standard 10-20 system of lead placement and reformatted into average and bipolar montages.  RECORDING TIME: 35:46 minutes  CLINICAL INFORMATION: 45 year old woman with history of staring spells and strong family history of epilepsy.  FINDINGS:  There was an 11-12 Hz posterior dominant rhythm with intermixed activity more anteriorly. There was a mild excess of beta activity.    The patient became drowsy but did not enter sleep.    There were several 3-6 second bursts of generalized 3 Hz polyspike wave activity. Also there were some temporal sharp waves.   EKG channel showed NSR.   There was a normal photic stimulation response. Hyperventilation and recovery did not significantly change the rhythms.  IMPRESSION: This is an abnormal EEG several runs of 3 Hz polyspike wave activity not associated with any clinical change. This is consistent with juvenile myoclonic epilepsy.      INTERPRETING PHYSICIAN:   Jahron Hunsinger A. Felecia Shelling, MD, PhD Certified in Neurology, [place] [facility], Sleep Medicine, Pain Medicine and Neuroimaging  [facility] [address] [place], [place] [address] [phone]

## 2016-03-24 ENCOUNTER — Telehealth: Payer: Self-pay | Admitting: *Deleted

## 2016-03-24 NOTE — Telephone Encounter (Signed)
Spoke with [name] and reviewed EEG results with her and explained RAS has sent Lamcital titration to [facility] on [address]. --she verbalized understanding of same.  F/U appt. with RAS on 04-15-16 confirmed/fim

## 2016-03-24 NOTE — Telephone Encounter (Signed)
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 I will call in lamotrigine 50 mg as follows: One half pill daily 7 days, one pill twice a day 7 days then 1 pill twice a day.

## 2016-03-24 NOTE — Telephone Encounter (Signed)
LMTC./fim 

## 2016-03-25 ENCOUNTER — Telehealth: Payer: Self-pay | Admitting: Pulmonary Disease

## 2016-03-25 NOTE — Telephone Encounter (Signed)
Please let [name] know that I reviewed the neurologists note. There does not seem to be concern for neurosarcoid. Ask her to reduce prednisone dose to 20 mg/day. Make sure she has an appointment to see me in 2 months. Thanks.

## 2016-03-25 NOTE — Telephone Encounter (Signed)
lmomtcb x1 

## 2016-03-27 NOTE — Telephone Encounter (Signed)
Pt returned phone call...contact number [phone].[name]

## 2016-03-27 NOTE — Telephone Encounter (Signed)
Spoke with pt, aware of below recs.  appt moved out 2 months.  Nothing further needed.

## 2016-03-27 NOTE — Telephone Encounter (Signed)
lmtcb for pt.  

## 2016-03-31 LAB — CUP PACEART INCLINIC DEVICE CHECK
Battery Remaining Longevity: 132 mo
Battery Voltage: 3.05 V
HIGH POWER IMPEDANCE MEASURED VALUE: 76 Ohm
HighPow Impedance: 51 Ohm
Implantable Lead Model: 6947
Implantable Pulse Generator Implant Date: 20161117
Lead Channel Impedance Value: 342 Ohm
Lead Channel Sensing Intrinsic Amplitude: 8.875 mV
Lead Channel Setting Pacing Amplitude: 3 V
Lead Channel Setting Pacing Pulse Width: 0.4 ms
Lead Channel Setting Sensing Sensitivity: 0.3 mV
MDC IDC LEAD IMPLANT DT: 20080828
MDC IDC LEAD LOCATION: 753860
MDC IDC MSMT LEADCHNL RV IMPEDANCE VALUE: 399 Ohm
MDC IDC MSMT LEADCHNL RV PACING THRESHOLD AMPLITUDE: 1.25 V
MDC IDC MSMT LEADCHNL RV PACING THRESHOLD PULSEWIDTH: 0.4 ms
MDC IDC SESS DTM: 20171103182959
MDC IDC STAT BRADY RV PERCENT PACED: 0.01 %

## 2016-04-13 ENCOUNTER — Ambulatory Visit: Payer: Medicaid Other | Admitting: Pulmonary Disease

## 2016-04-15 ENCOUNTER — Encounter: Payer: Self-pay | Admitting: Neurology

## 2016-04-15 ENCOUNTER — Ambulatory Visit (INDEPENDENT_AMBULATORY_CARE_PROVIDER_SITE_OTHER): Payer: Medicaid Other | Admitting: Neurology

## 2016-04-15 VITALS — BP 116/76 | HR 74 | Resp 14 | Ht 66.5 in | Wt 170.0 lb

## 2016-04-15 DIAGNOSIS — D860 Sarcoidosis of lung: Secondary | ICD-10-CM

## 2016-04-15 DIAGNOSIS — G4489 Other headache syndrome: Secondary | ICD-10-CM | POA: Diagnosis not present

## 2016-04-15 DIAGNOSIS — R269 Unspecified abnormalities of gait and mobility: Secondary | ICD-10-CM

## 2016-04-15 DIAGNOSIS — D869 Sarcoidosis, unspecified: Secondary | ICD-10-CM | POA: Diagnosis not present

## 2016-04-15 DIAGNOSIS — G40B09 Juvenile myoclonic epilepsy, not intractable, without status epilepticus: Secondary | ICD-10-CM

## 2016-04-15 MED ORDER — LAMOTRIGINE 100 MG PO TABS: 100.0000 mg | ORAL_TABLET | Freq: Every day | ORAL | 5 refills | Status: DC

## 2016-04-15 MED ORDER — CLONAZEPAM 0.5 MG PO TABS: 0.5000 mg | ORAL_TABLET | Freq: Two times a day (BID) | ORAL | 3 refills | Status: DC | PRN

## 2016-04-15 NOTE — Progress Notes (Signed)
[facility]  PATIENT: [name] DOB: 09/03/1970  REFERRING DOCTOR OR PCP:  PCP Phill Myron; GI Dr. Vaughan Browner; Pulm Dr. Havery Moros SOURCE: patient, note from PCP., GI and Pulmonary, CT report, labs, CT images on PACS  _________________________________   HISTORICAL  CHIEF COMPLAINT:  Chief Complaint  Patient presents with  . Headache    Sts. h/a's are same frequency (2-3 per week), and same severity.  Sts. she only received a few hrs. relief from ONB given at last ov.  Sts. gait is about the same.  Sts. she started Lamictal as rx'd, with only 2 missed doses.  Sts. she continues to have staring spells./fim  . Gait Disturbance  . Seizures    HISTORY OF PRESENT ILLNESS:  [name] is a 45 year old woman with Stage III sarcoidosis, headaches and staring spells.      Seizures:  I personally reviewed the EEG performed earlier this month showing that she had 3 Hz spike-wave activity intermittently. 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 I started lamotrigine and she is currently on 50 mg twice a day but has not noted any change in her spells.  Seizure History:   She was diagnosed with seizures 12-15 years ago based on episodes of memory loss and decreased responsiveness for a minute or so.   She reports an EEG was abnormal.    Seizures are also in uncles x 2, aunt, maternal grandmother and some grand-aunts.   As a teenager, she also began to have some myoclonic episodes in the morning. She continues to have these.   She has never had a GTC seizure.   EEG November 2017 shows 3 Hz spike wave activity intermittently consistent with juvenile myoclonic epilepsy.  HA:   She is still experiencing headaches 2-3 days a week. When she gets a headache, it is always on the left side and pounding. It is worse in the forehead but is seems to sometimes start at the side or back of the head and radiate forward. When she gets the headache  she will have nausea, occasional vomiting, photophobia and phonophobia. Moving her head will worsen the pain. Laying down in a cold quiet dark room will sometimes help.     She has had some headaches since adolescence but they are more common now.       Noncontrasted CT scan 11/22/2014 showed brain was normal.   It was done after she passed out and hit her head.   She has an AICD and has not had an MRI of the brain.   REVIEW OF SYSTEMS: Constitutional: No fevers, chills, sweats, or change in appetite Eyes: No visual changes, double vision, eye pain Ear, nose and throat: No hearing loss, ear pain, nasal congestion, sore throat Cardiovascular: No chest pain, palpitations Respiratory: No shortness of breath at rest or with exertion.   No wheezes GastrointestinaI: No nausea, vomiting, diarrhea, abdominal pain, fecal incontinence Genitourinary: No dysuria, urinary retention or frequency.  No nocturia. Musculoskeletal: No neck pain, back pain Integumentary: No rash, pruritus, skin lesions Neurological: as above Psychiatric: No depression at this time.  No anxiety Endocrine: No palpitations, diaphoresis, change in appetite, change in weigh or increased thirst Hematologic/Lymphatic: No anemia, purpura, petechiae. Allergic/Immunologic: No itchy/runny eyes, nasal congestion, recent allergic reactions, rashes  ALLERGIES: Allergies  Allergen Reactions  . Contrast Media [Iodinated Diagnostic Agents] Anaphylaxis and Shortness Of Breath    Needed to be defibrillated and patient was pre-medicated with radiology 13 hour  premeds.   . Augmentin [Amoxicillin-Pot Clavulanate] Other (See Comments)    "stomach hurt"  . Codeine Nausea And Vomiting    jittery  . Demerol [Meperidine]   . Iohexol      Code: RASH, Desc: PT STATES SHE IS ALLERGIC TO IV CONTRAST 05/28/06/RM, Onset Date: [id number]   . Morphine And Related Nausea And Vomiting    HOME MEDICATIONS:  Current Outpatient Prescriptions:  .   ACCU-CHEK SOFTCLIX LANCETS lancets, Test Blood Sugar twice a day.  ICD-10 code E11.9., Disp: 100 each, Rfl: 12 .  acetaminophen (TYLENOL) 500 MG tablet, Take 1,000 mg by mouth every 6 (six) hours as needed for mild pain or fever. Reported on 07/22/2015, Disp: , Rfl:  .  beclomethasone (QVAR) 80 MCG/ACT inhaler, Inhale 2 puffs into the lungs 2 (two) times daily., Disp: 1 Inhaler, Rfl: 5 .  Blood Glucose Monitoring Suppl (ACCU-CHEK AVIVA PLUS) w/Device KIT, Test blood sugar once fasting and once ICD-10  Code  E11.9, Disp: 1 kit, Rfl: 0 .  cyclobenzaprine (FLEXERIL) 10 MG tablet, Take 1 tablet (10 mg total) by mouth 3 (three) times daily as needed for muscle spasms. Reported on 11/29/2015, Disp: 30 tablet, Rfl: 0 .  cycloSPORINE (RESTASIS) 0.05 % ophthalmic emulsion, Place 1 drop into both eyes 2 (two) times daily., Disp: 0.4 mL, Rfl: 0 .  fluticasone (FLONASE) 50 MCG/ACT nasal spray, Place 2 sprays into both nostrils daily as needed for allergies. , Disp: , Rfl:  .  gabapentin (NEURONTIN) 300 MG capsule, Take 1 capsule (300 mg total) by mouth 3 (three) times daily., Disp: 90 capsule, Rfl: 2 .  glucose blood (ACCU-CHEK AVIVA PLUS) test strip, Test Blood Sugar twice a day. ICD-10 code E11.9, Disp: 100 each, Rfl: 12 .  ibuprofen (ADVIL,MOTRIN) 600 MG tablet, Take 1 tablet by mouth every 8 (eight) hours as needed., Disp: , Rfl: 0 .  Insulin Glargine (LANTUS SOLOSTAR) 100 UNIT/ML Solostar Pen, Inject 40 Units into the skin at bedtime., Disp: 30 mL, Rfl: 0 .  lactulose (CHRONULAC) 10 GM/15ML solution, Take 30 ml TID, Disp: 2700 mL, Rfl: 2 .  LamoTRIgine 50 MG TBDP, 1/2 pill daily x 7 days, then 1/2 pill bid x 7 days, then 1 pill po bid, Disp: 60 tablet, Rfl: 11 .  Lancet Devices (ACCU-CHEK SOFTCLIX) lancets, Test Blood Sugar twice a day. ICD-10 code E11.9., Disp: 1 each, Rfl: 0 .  Linaclotide (LINZESS) 145 MCG CAPS capsule, Take 1 capsule (145 mcg total) by mouth daily., Disp: 30 capsule, Rfl: 3 .  meclizine  (ANTIVERT) 25 MG tablet, Take 0.5 tablets (12.5 mg total) by mouth 3 (three) times daily as needed for dizziness., Disp: 30 tablet, Rfl: 0 .  NOVOLOG FLEXPEN 100 UNIT/ML FlexPen, Inject 4-8 Units into the skin 3 (three) times daily., Disp: 15 mL, Rfl: 0 .  ondansetron (ZOFRAN) 4 MG tablet, Take 1 tablet (4 mg total) by mouth every 6 (six) hours as needed for nausea or vomiting., Disp: 60 tablet, Rfl: 1 .  pantoprazole (PROTONIX) 40 MG tablet, Take 1 tablet (40 mg total) by mouth daily at 6 (six) AM., Disp: 30 tablet, Rfl: 2 .  PAZEO 0.7 % SOLN, instill 1 drop into both eyes once daily, Disp: , Rfl: 0 .  potassium chloride SA (K-DUR,KLOR-CON) 20 MEQ tablet, Take 1 tablet (20 mEq total) by mouth daily., Disp: 30 tablet, Rfl: 2 .  predniSONE (DELTASONE) 10 MG tablet, Take 30 mg by mouth daily with breakfast., Disp: , Rfl:  .  propranolol (INDERAL) 80 MG tablet, Take 1 tablet (80 mg total) by mouth 2 (two) times daily., Disp: 30 tablet, Rfl: 11 .  sulfamethoxazole-trimethoprim (BACTRIM DS) 800-160 MG tablet, Take 1 tablet by mouth 3 (three) times a week., Disp: 12 tablet, Rfl: 5  PAST MEDICAL HISTORY: Past Medical History:  Diagnosis Date  . Abdominal pain, periumbilic   . Allergic rhinitis   . Arthritis   . Asthma   . Automatic implantable cardioverter-defibrillator in situ 2006, replaced 2008   [employer]TRONIC  . Bronchitis   . Cancer ([place])    Nodules in lungs pt believes were cancerous pt unsure  . DM (diabetes mellitus) ([place]) 1997/1998   type 1, initially gestational but soon after child birth developed IDDM  . Esophageal stricture 2005/2009   esophageal strictures dilated 2005, 2009  . Eye hemorrhage    bilateral  . Fibroid, uterine   . Fibromyalgia   . GERD (gastroesophageal reflux disease) 2005  . History of blood transfusion years ago  . Internal hemorrhoids 2010   2011 band ligation of int rrhoids.  . Long Q-T syndrome   . Neuropathy in diabetes ([place])   . Palpitation   .  Sarcoidosis ([place])   . Seizure disorder ([place])    none recently  . Seizures ([place])   . Stroke ([place])    QUESTIONABLE TIA     PAST SURGICAL HISTORY: Past Surgical History:  Procedure Laterality Date  . AUTOMATIC IMPLANTABLE CARDIAC DEFIBRILLATOR SITU  2006,    ICD-Medtronic   Remote - Yes   . CERVIX REMOVAL    . DILITATION & CURRETTAGE/HYSTROSCOPY WITH HYDROTHERMAL ABLATION N/A 06/07/2013   Procedure: DILATATION & CURETTAGE/HYSTEROSCOPY WITH attempted HYDROTHERMAL ABLATION;  Surgeon: Frederico Hamman, MD;  Location: [facility];  Service: Gynecology;  Laterality: N/A;  . EP IMPLANTABLE DEVICE N/A 04/04/2015   Procedure:  ICD Generator Changeout;  Surgeon: Evans Lance, MD;  Location: [facility];  Service: Cardiovascular;  Laterality: N/A;  . ESOPHAGOGASTRODUODENOSCOPY (EGD) WITH PROPOFOL N/A 10/24/2014   Procedure: ESOPHAGOGASTRODUODENOSCOPY (EGD) WITH PROPOFOL;  Surgeon: Inda Castle, MD;  Location: [facility];  Service: Endoscopy;  Laterality: N/A;  . EXPLORATORY LAPAROTOMY  11/06/14   evacuation of post TAH/BSO hematoma  . HAND SURGERY Right    x 2  . HEMORRHOID BANDING  03/2010  . SALPINGECTOMY    . SVT ablation    . TOTAL ABDOMINAL HYSTERECTOMY W/ BILATERAL SALPINGOOPHORECTOMY  11/06/14   at [facility]. for menorrhagia, pelvic pain and enlarging uterine fibroids  . TUBAL LIGATION    . VIDEO BRONCHOSCOPY Bilateral 01/24/2015   Procedure: VIDEO BRONCHOSCOPY WITHOUT FLUORO;  Surgeon: Marshell Garfinkel, MD;  Location: [facility];  Service: Cardiopulmonary;  Laterality: Bilateral;    FAMILY HISTORY: Family History  Problem Relation Age of Onset  . Heart attack Mother   . Lung cancer Maternal Uncle     x2  . Breast cancer Maternal Aunt     x2  . Stomach cancer Paternal Uncle   . Stomach cancer Paternal Grandfather   . Stomach cancer Paternal Grandmother   . Diabetes Father   . Positive PPD/TB Exposure Father   . Glaucoma Father   . Colon cancer Neg Hx     SOCIAL  HISTORY:  Social History   Social History  . Marital status: Single    Spouse name: N/A  . Number of children: N/A  . Years of education: N/A   Occupational History  . Not on file.   Social History  Main Topics  . Smoking status: Never Smoker  . Smokeless tobacco: Never Used  . Alcohol use 0.0 oz/week     Comment: occ.  . Drug use: No  . Sexual activity: No   Other Topics Concern  . Not on file   Social History Narrative  . No narrative on file     PHYSICAL EXAM  Vitals:   04/15/16 1004  BP: 116/76  Pulse: 74  Resp: 14  Weight: 170 lb (77.1 kg)  Height: 5' 6.5" (1.689 m)    Body mass index is 27.03 kg/m.   General: The patient is well-developed and well-nourished and in no acute distress  Neck: The neck is supple, no carotid bruits are noted.  The neck is very tender at the left occiput and pain radiates forward.   Neurologic Exam  Mental status: The patient is alert and oriented x 3 at the time of the examination. The patient has apparent normal recent and remote memory, with an apparently normal attention span and concentration ability.   Speech is normal.  Cranial nerves: Extraocular movements are full.   Facial symmetry is present. There is good facial sensation to soft touch bilaterally.Facial strength is normal.  Trapezius and sternocleidomastoid strength is normal. No dysarthria is noted.  The tongue is midline, and the patient has symmetric elevation of the soft palate. No obvious hearing deficits are noted.  Motor:  Muscle bulk is normal.   Tone is normal. Strength is  5 / 5 in all 4 extremities.   Sensory: Sensory testing is intact to  soft touch in arms, hands and proximal legs.      Coordination: Cerebellar testing reveals good finger-nose-finger bilaterally.  Gait and station: Station is normal.   Gait is mildly wide and tandem is wide. Romberg is negative.   Reflexes: Deep tendon reflexes are symmetric and normal bilaterally.       DIAGNOSTIC DATA (LABS, IMAGING, TESTING) - I reviewed patient records, labs, notes, testing and imaging myself where available.  Lab Results  Component Value Date   WBC 4.3 08/02/2015   HGB 14.8 08/02/2015   HCT 41.9 08/02/2015   MCV 83.6 08/02/2015   PLT 335 08/02/2015      Component Value Date/Time   NA 133 (L) 06/17/2015 1145   K 4.4 06/17/2015 1145   CL 96 06/17/2015 1145   CO2 24 06/17/2015 1145   GLUCOSE 398 (H) 06/17/2015 1145   BUN 10 06/17/2015 1145   CREATININE 0.56 06/17/2015 1145   CREATININE 0.41 (L) 03/28/2015 0848   CALCIUM 10.2 06/17/2015 1145   PROT 7.9 01/23/2016 1226   ALBUMIN 4.1 01/23/2016 1226   AST 21 01/23/2016 1226   ALT 25 01/23/2016 1226   ALKPHOS 158 (H) 01/23/2016 1226   BILITOT 0.8 01/23/2016 1226   GFRNONAA >60 03/03/2015 1148   GFRAA >60 03/03/2015 1148   Lab Results  Component Value Date   CHOL 166 03/29/2015   HDL 14 (L) 12/24/2014   LDLCALC 61 12/24/2014   TRIG 236 (H) 12/24/2014   CHOLHDL 8.7 (H) 12/24/2014   Lab Results  Component Value Date   HGBA1C 12.7 10/04/2015   No results found for: VITAMINB12 Lab Results  Component Value Date   TSH 1.16 06/17/2015       ASSESSMENT AND PLAN  Nonintractable juvenile myoclonic epilepsy without status epilepticus ([place])  Sarcoidosis, stage 3 ([facility])  Gait disturbance  Other headache syndrome     1.   Increase lamotrigine to 100 mg  by mouth twice a day and add clonazepam 0.5 mg by mouth twice a day for the juvenile myoclonic epilepsy.   Hopefully this will also help her headaches. 2.   I've asked her to call us back in a few weeks if she does not note any improvement. 3.   She will return for a follow-up evaluation 3 months or call sooner if there are new or worsening neurologic symptoms. Mykira Hofmeister A. Felecia Shelling, MD, PhD 70/34/0352, 48:18 AM Certified in Neurology, Clinical Neurophysiology, Sleep Medicine, Pain Medicine and Neuroimaging  [facility] [address] [place], [place] [address] [phone]

## 2016-04-21 ENCOUNTER — Other Ambulatory Visit: Payer: Self-pay | Admitting: *Deleted

## 2016-04-22 MED ORDER — NOVOLOG FLEXPEN 100 UNIT/ML ~~LOC~~ SOPN: 4.0000 [IU] | PEN_INJECTOR | Freq: Three times a day (TID) | SUBCUTANEOUS | 0 refills | Status: DC

## 2016-05-06 ENCOUNTER — Ambulatory Visit: Payer: Medicaid Other | Admitting: Neurology

## 2016-05-19 ENCOUNTER — Ambulatory Visit (INDEPENDENT_AMBULATORY_CARE_PROVIDER_SITE_OTHER): Payer: Medicaid Other | Admitting: Podiatry

## 2016-05-19 ENCOUNTER — Encounter: Payer: Self-pay | Admitting: Podiatry

## 2016-05-19 VITALS — BP 143/87 | HR 76 | Resp 14

## 2016-05-19 DIAGNOSIS — B351 Tinea unguium: Secondary | ICD-10-CM

## 2016-05-19 DIAGNOSIS — M79676 Pain in unspecified toe(s): Secondary | ICD-10-CM | POA: Diagnosis not present

## 2016-05-19 DIAGNOSIS — E0842 Diabetes mellitus due to underlying condition with diabetic polyneuropathy: Secondary | ICD-10-CM

## 2016-05-19 DIAGNOSIS — L608 Other nail disorders: Secondary | ICD-10-CM

## 2016-05-19 NOTE — Progress Notes (Signed)
Patient ID: [name], female   DOB: 11/14/1970, 46 y.o.   MRN: [id number]    Orientated 3  Vascular: No peripheral edema bilaterally DP pulses 2/4 bilaterally PT pulses 2/4 bilaterally Capillary reflex immediate bilaterally  Neurological: Sensation to 10 g monofilament wire intact 3/5 bilaterally Vibratory sensation reactive bilaterally Ankle reflex equal and reactive bilaterally  Dermatological: No open skin lesions bilaterally The toenails elongated, incurvated 6-10 The distal right hallux nail has some dried blood at the very distal aspect of the toe beneath the nail and after debridement to dry blood is totally removed and there is a small corn-like growth in the area which was debrided. There is no erythema, edema, drainage surrounding this area Musculoskeletal: Patient has unstable gait using cane HAV deformity left greater than right This note restriction ankle, subtalar, midtarsal joints bilaterally  Assessment: Diabetic with peripheral neuropathy Gait disturbance Incurvated toenails 6-10 Keratoses distal right hallux without a clinical sign of infection  Plan: Today reviewed the results of exam with patient today and offered nail debridement and she verbally consents. The toenails 6-10 are debrided mechanically at elective without any bleeding The distal right nail was debrided without any bleeding. Topical antibiotic ointment applied to the area. Patient advised to observe the areas for any sign of increased pain, swelling, redness and present to the emergency department or contact our office.  Reappoint 3 months

## 2016-05-19 NOTE — Patient Instructions (Signed)
Put Vaseline on the end of the right toenail area to soften the corn that's beneath the end of the right big toenail daily and cover with a Band-Aid until comfortable  Diabetes and Foot Care Diabetes may cause you to have problems because of poor blood supply (circulation) to your feet and legs. This may cause the skin on your feet to become thinner, break easier, and heal more slowly. Your skin may become dry, and the skin may peel and crack. You may also have nerve damage in your legs and feet causing decreased feeling in them. You may not notice minor injuries to your feet that could lead to infections or more serious problems. Taking care of your feet is one of the most important things you can do for yourself. Follow these instructions at home:  Wear shoes at all times, even in the house. Do not go barefoot. Bare feet are easily injured.  Check your feet daily for blisters, cuts, and redness. If you cannot see the bottom of your feet, use a mirror or ask someone for help.  Wash your feet with warm water (do not use hot water) and mild soap. Then pat your feet and the areas between your toes until they are completely dry. Do not soak your feet as this can dry your skin.  Apply a moisturizing lotion or petroleum jelly (that does not contain alcohol and is unscented) to the skin on your feet and to dry, brittle toenails. Do not apply lotion between your toes.  Trim your toenails straight across. Do not dig under them or around the cuticle. File the edges of your nails with an emery board or nail file.  Do not cut corns or calluses or try to remove them with medicine.  Wear clean socks or stockings every day. Make sure they are not too tight. Do not wear knee-high stockings since they may decrease blood flow to your legs.  Wear shoes that fit properly and have enough cushioning. To break in new shoes, wear them for just a few hours a day. This prevents you from injuring your feet. Always look in  your shoes before you put them on to be sure there are no objects inside.  Do not cross your legs. This may decrease the blood flow to your feet.  If you find a minor scrape, cut, or break in the skin on your feet, keep it and the skin around it clean and dry. These areas may be cleansed with mild soap and water. Do not cleanse the area with peroxide, alcohol, or iodine.  When you remove an adhesive bandage, be sure not to damage the skin around it.  If you have a wound, look at it several times a day to make sure it is healing.  Do not use heating pads or hot water bottles. They may burn your skin. If you have lost feeling in your feet or legs, you may not know it is happening until it is too late.  Make sure your health care provider performs a complete foot exam at least annually or more often if you have foot problems. Report any cuts, sores, or bruises to your health care provider immediately. Contact a health care provider if:  You have an injury that is not healing.  You have cuts or breaks in the skin.  You have an ingrown nail.  You notice redness on your legs or feet.  You feel burning or tingling in your legs or feet.  You have pain or cramps in your legs and feet.  Your legs or feet are numb.  Your feet always feel cold. Get help right away if:  There is increasing redness, swelling, or pain in or around a wound.  There is a red line that goes up your leg.  Pus is coming from a wound.  You develop a fever or as directed by your health care provider.  You notice a bad smell coming from an ulcer or wound. This information is not intended to replace advice given to you by your health care provider. Make sure you discuss any questions you have with your health care provider. Document Released: 05/01/2000 Document Revised: 10/10/2015 Document Reviewed: 10/11/2012 Elsevier Interactive Patient Education  2017 [employer].

## 2016-06-02 ENCOUNTER — Other Ambulatory Visit (INDEPENDENT_AMBULATORY_CARE_PROVIDER_SITE_OTHER): Payer: Medicaid Other

## 2016-06-02 ENCOUNTER — Ambulatory Visit (INDEPENDENT_AMBULATORY_CARE_PROVIDER_SITE_OTHER): Payer: Medicaid Other | Admitting: Pulmonary Disease

## 2016-06-02 ENCOUNTER — Ambulatory Visit (INDEPENDENT_AMBULATORY_CARE_PROVIDER_SITE_OTHER)
Admission: RE | Admit: 2016-06-02 | Discharge: 2016-06-02 | Disposition: A | Discharge status: Home / Self Care | Payer: Medicaid Other | Source: Ambulatory Visit | Attending: Pulmonary Disease | Admitting: Pulmonary Disease

## 2016-06-02 ENCOUNTER — Encounter: Payer: Self-pay | Admitting: Pulmonary Disease

## 2016-06-02 VITALS — BP 132/80 | HR 83 | Ht 66.0 in | Wt 172.4 lb

## 2016-06-02 DIAGNOSIS — D862 Sarcoidosis of lung with sarcoidosis of lymph nodes: Secondary | ICD-10-CM | POA: Diagnosis not present

## 2016-06-02 DIAGNOSIS — J90 Pleural effusion, not elsewhere classified: Secondary | ICD-10-CM

## 2016-06-02 LAB — BASIC METABOLIC PANEL
BUN: 6 mg/dL (ref 6–23)
CALCIUM: 9.4 mg/dL (ref 8.4–10.5)
CO2: 30 mEq/L (ref 19–32)
Chloride: 93 mEq/L — ABNORMAL LOW (ref 96–112)
Creatinine, Ser: 0.65 mg/dL (ref 0.40–1.20)
GFR: 126.97 mL/min (ref >60.00)
GLUCOSE: 412 mg/dL — AB (ref 70–99)
Potassium: 3.4 mEq/L — ABNORMAL LOW (ref 3.5–5.1)
SODIUM: 131 meq/L — AB (ref 135–145)

## 2016-06-02 MED ORDER — BECLOMETHASONE DIPROPIONATE 80 MCG/ACT IN AERS: 2.0000 | INHALATION_SPRAY | Freq: Two times a day (BID) | RESPIRATORY_TRACT | 5 refills | Status: DC

## 2016-06-02 MED ORDER — PREDNISONE 10 MG PO TABS: 10.0000 mg | ORAL_TABLET | Freq: Every day | ORAL | 3 refills | Status: DC

## 2016-06-02 MED ORDER — NAPROXEN 375 MG PO TABS: 375.0000 mg | ORAL_TABLET | Freq: Two times a day (BID) | ORAL | 3 refills | Status: DC

## 2016-06-02 NOTE — Patient Instructions (Signed)
We will put in an order for chest x-ray, CBC and CMP today Descendent refills for prednisone 10 mg per day, Qvar and a prescription for naproxen. I have filled out the forms for handicap parking.  Return to clinic in 2 months.

## 2016-06-02 NOTE — Progress Notes (Signed)
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 Asthmatic bronchitis Chronic cough Ventricular tachycardia s/p AICD. Long QT syndrome Left pleural effusion 04/21/2015. Improved>> Thoracentesis >>exudative   HPI: [name] is 46 year old with stage III sarcoidosis, asthmatic bronchitis, chronic cough. She is a former patient of Dr. Joya Gaskins. She was diagnosed around 2003. Is not clear if she had a tissue diagnosis. There is record of a transbronchial biopsy in 2003 but no granulomas were seen. She had been on and off systemic prednisone. She was admitted in September 2016 with sarcoidosis flare. Bronchial with BAL showed 10K staph aureus which was thought to be a contaminant. She was given Solu-Medrol 60 q6 and then discharged on a slow prednisone taper starting at 60 mg. She is also being followed by cardiology for prolonged QT syndrome, family history of sudden death. Her ICD was changed in 2015-04-21.   She underwent a thoracentesis in November 16 which showed exudative left-sided predominant effusion, and cultures were negative. Repeat CXR showed improvement in her effusion.   Interim History: She she had a liver biopsy early 2017 that showed hepatic sarcoidosis with elevated LFTs. She's been on a prolonged steroid taper with good response and her last LFTs have normalized. We reduced her prednisone to 30 mg in July and then to instructed her to reduce the dose to 20 mg in 2016/04/20. But for some reason she has actually reduced the dose to 10 mg/day. 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 She is currently on lamotrigine and clonazepam.    She still has generalized fatigue, body ache, headaches without change. She denies any cough, sputum production, dyspnea, wheezing. She feels that her dyspnea is slightly worse but is not overly concerned about that at present.                           Outpatient Encounter Prescriptions as of 06/02/2016  Medication Sig  . ACCU-CHEK SOFTCLIX LANCETS lancets Test Blood Sugar twice a day.  ICD-10 code E11.9.  Marland Kitchen acetaminophen (TYLENOL) 500 MG tablet Take 1,000 mg by mouth every 6 (six) hours as needed for mild pain or fever. Reported on 07/22/2015  . beclomethasone (QVAR) 80 MCG/ACT inhaler Inhale 2 puffs into the lungs 2 (two) times daily.  . Blood Glucose Monitoring Suppl (ACCU-CHEK AVIVA PLUS) w/Device KIT Test blood sugar once fasting and once ICD-10  Code  E11.9  . clonazePAM (KLONOPIN) 0.5 MG tablet Take 1 tablet (0.5 mg total) by mouth 2 (two) times daily as needed for anxiety.  . cyclobenzaprine (FLEXERIL) 10 MG tablet Take 1 tablet (10 mg total) by mouth 3 (three) times daily as needed for muscle spasms. Reported on 11/29/2015  . cycloSPORINE (RESTASIS) 0.05 % ophthalmic emulsion Place 1 drop into both eyes 2 (two) times daily.  . fluticasone (FLONASE) 50 MCG/ACT nasal spray Place 2 sprays into both nostrils daily as needed for allergies.   Marland Kitchen gabapentin (NEURONTIN) 300 MG capsule Take 1 capsule (300 mg total) by mouth 3 (three) times daily.  Marland Kitchen glucose blood (ACCU-CHEK AVIVA PLUS) test strip Test Blood Sugar twice a day. ICD-10 code  E11.9  . ibuprofen (ADVIL,MOTRIN) 600 MG tablet Take 1 tablet by mouth every 8 (eight) hours as needed.  . Insulin Glargine (LANTUS SOLOSTAR) 100 UNIT/ML Solostar Pen Inject 40 Units into the skin at bedtime.  Marland Kitchen lactulose (CHRONULAC) 10 GM/15ML solution Take 30 ml TID  . lamoTRIgine (LAMICTAL) 100 MG tablet Take 1 tablet (100 mg total) by mouth daily.  Elmore Guise Devices (ACCU-CHEK SOFTCLIX) lancets Test Blood Sugar twice a day. ICD-10 code E11.9.  . Linaclotide  (LINZESS) 145 MCG CAPS capsule Take 1 capsule (145 mcg total) by mouth daily.  . meclizine (ANTIVERT) 25 MG tablet Take 0.5 tablets (12.5 mg total) by mouth 3 (three) times daily as needed for dizziness.  Marland Kitchen NOVOLOG FLEXPEN 100 UNIT/ML FlexPen Inject 4-8 Units into the skin 3 (three) times daily.  . ondansetron (ZOFRAN) 4 MG tablet Take 1 tablet (4 mg total) by mouth every 6 (six) hours as needed for nausea or vomiting.  . pantoprazole (PROTONIX) 40 MG tablet Take 1 tablet (40 mg total) by mouth daily at 6 (six) AM.  . PAZEO 0.7 % SOLN instill 1 drop into both eyes once daily  . potassium chloride SA (K-DUR,KLOR-CON) 20 MEQ tablet Take 1 tablet (20 mEq total) by mouth daily.  . predniSONE (DELTASONE) 10 MG tablet Take 30 mg by mouth daily with breakfast.  . propranolol (INDERAL) 80 MG tablet Take 1 tablet (80 mg total) by mouth 2 (two) times daily.  Marland Kitchen sulfamethoxazole-trimethoprim (BACTRIM DS) 800-160 MG tablet Take 1 tablet by mouth 3 (three) times a week.   No facility-administered encounter medications on file as of 06/02/2016.     Allergies as of 06/02/2016 - Review Complete 06/02/2016  Allergen Reaction Noted  . Contrast media [iodinated diagnostic agents] Anaphylaxis and Shortness Of Breath 05/30/2013  . Augmentin [amoxicillin-pot clavulanate] Other (See Comments) 10/23/2014  . Codeine Nausea And Vomiting   . Demerol [meperidine]  07/22/2015  . Iohexol  05/28/2006  . Morphine and related Nausea And Vomiting 05/30/2013    Past Medical History:  Diagnosis Date  . Abdominal pain, periumbilic   . Allergic rhinitis   . Arthritis   . Asthma   . Automatic implantable cardioverter-defibrillator in situ 2006, replaced 2008   MEDTRONIC  . Bronchitis   . Cancer ([place])    Nodules in lungs pt believes were cancerous pt unsure  . DM (diabetes mellitus) ([place]) 1997/1998   type 1, initially gestational but soon after child birth developed IDDM  . Esophageal stricture 2005/2009   esophageal  strictures dilated 2005, 2009  . Eye hemorrhage    bilateral  . Fibroid, uterine   . Fibromyalgia   . GERD (gastroesophageal reflux disease) 2005  . History of blood transfusion years ago  . Internal hemorrhoids 2010   2011 band ligation of int rrhoids.  . Long Q-T syndrome   . Neuropathy in diabetes ([place])   . Palpitation   . Sarcoidosis ([place])   . Seizure disorder ([place])    none recently  . Seizures ([place])   . Stroke ([place])    QUESTIONABLE TIA     Past Surgical History:  Procedure Laterality Date  . AUTOMATIC IMPLANTABLE CARDIAC DEFIBRILLATOR SITU  2006,    ICD-Medtronic   Remote - Yes   . CERVIX REMOVAL    . DILITATION & CURRETTAGE/HYSTROSCOPY WITH HYDROTHERMAL ABLATION N/A 06/07/2013   Procedure: DILATATION & CURETTAGE/HYSTEROSCOPY WITH attempted HYDROTHERMAL ABLATION;  Surgeon: Frederico Hamman, MD;  Location: [facility];  Service: Gynecology;  Laterality: N/A;  .  EP IMPLANTABLE DEVICE N/A 04/04/2015   Procedure:  ICD Generator Changeout;  Surgeon: Evans Lance, MD;  Location: [facility];  Service: Cardiovascular;  Laterality: N/A;  . ESOPHAGOGASTRODUODENOSCOPY (EGD) WITH PROPOFOL N/A 10/24/2014   Procedure: ESOPHAGOGASTRODUODENOSCOPY (EGD) WITH PROPOFOL;  Surgeon: Inda Castle, MD;  Location: [facility];  Service: Endoscopy;  Laterality: N/A;  . EXPLORATORY LAPAROTOMY  11/06/14   evacuation of post TAH/BSO hematoma  . HAND SURGERY Right    x 2  . HEMORRHOID BANDING  03/2010  . SALPINGECTOMY    . SVT ablation    . TOTAL ABDOMINAL HYSTERECTOMY W/ BILATERAL SALPINGOOPHORECTOMY  11/06/14   at [facility]. for menorrhagia, pelvic pain and enlarging uterine fibroids  . TUBAL LIGATION    . VIDEO BRONCHOSCOPY Bilateral 01/24/2015   Procedure: VIDEO BRONCHOSCOPY WITHOUT FLUORO;  Surgeon: Marshell Garfinkel, MD;  Location: [facility];  Service: Cardiopulmonary;  Laterality: Bilateral;    Family History  Problem Relation Age of Onset  . Heart attack Mother   . Lung cancer Maternal  Uncle     x2  . Breast cancer Maternal Aunt     x2  . Stomach cancer Paternal Uncle   . Stomach cancer Paternal Grandfather   . Stomach cancer Paternal Grandmother   . Diabetes Father   . Positive PPD/TB Exposure Father   . Glaucoma Father   . Colon cancer Neg Hx     Social History   Social History  . Marital status: Single    Spouse name: N/A  . Number of children: N/A  . Years of education: N/A   Occupational History  . Not on file.   Social History Main Topics  . Smoking status: Never Smoker  . Smokeless tobacco: Never Used  . Alcohol use 0.0 oz/week     Comment: occ.  . Drug use: No  . Sexual activity: No   Other Topics Concern  . Not on file   Social History Narrative  . No narrative on file   Review of systems: Review of Systems  Constitutional: Negative for fever and chills.  HENT: Negative.   Eyes: Negative for blurred vision.  Respiratory: as per HPI  Cardiovascular: Negative for chest pain and palpitations.  Gastrointestinal: Negative for vomiting, diarrhea, blood per rectum. Genitourinary: Negative for dysuria, urgency, frequency and hematuria.  Musculoskeletal: Negative for myalgias, back pain and joint pain.  Skin: Negative for itching and rash.  Neurological: Negative for dizziness, tremors, focal weakness, seizures and loss of consciousness.  Endo/Heme/Allergies: Negative for environmental allergies.  Psychiatric/Behavioral: Negative for depression, suicidal ideas and hallucinations.  All other systems reviewed and are negative.  Physical Exam: Blood pressure 132/80, pulse 83, height 5' 6" (1.676 m), weight 172 lb 6.4 oz (78.2 kg), last menstrual period 07/23/2014, SpO2 97 %. Gen:      No acute distress HEENT:  EOMI, sclera anicteric Neck:     No masses; no thyromegaly Lungs:    Clear to auscultation bilaterally; normal respiratory effort CV:         Regular rate and rhythm; no murmurs Abd:      + bowel sounds; soft, non-tender; no palpable  masses, no distension Ext:    No edema; adequate peripheral perfusion Skin:      Warm and dry; no rash Neuro: alert and oriented x 3 Psych: normal mood and affect  Data Reviewed: PFTs (08/29/14) FVC 2.98 (85%) FEV1 2.42 (85%) F/F 81  IMAGING: CT scan (01/21/15) 1. There is a spectrum of  findings in the thorax that is most likely related to the patient's history of sarcoidosis. Specifically, there is a perilymphatic distribution of multiple tiny subcentimeter pulmonary nodules, in addition to mediastinal lymphadenopathy. 2. Dilatation of the pulmonic trunk (3.6 cm in diameter), suggestive of pulmonary arterial hypertension. 3. Splenomegaly. 4. Probable hepatomegaly.  CXR (03/03/15) Moderate left pleural effusion. Adjacent left lower lobe atelectasis versus infection. Possible lingular airspace disease as well. New enlargement of the cardiopericardial silhouette, which could relate to cardiomegaly and/or pericardial effusion.  CXR (06/13/15) Cardiomegaly, mild CHF, improved left effusion  All images reviewed.  LABS: Echo 01/28/15 EF 60-65%, PAP 36.  ACE level 01/22/15 - 177.   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 Hepatic Function Latest Ref Rng & Units 01/23/2016 11/25/2015 10/09/2015  Total Protein 6.0 - 8.3 g/dL 7.9 7.9 7.4  Albumin 3.5 - 5.2 g/dL 4.1 3.9 3.8  AST 0 - 37 U/L 21 23 41(H)  ALT 0 - 35 U/L 25 24 47(H)  Alk Phosphatase 39 - 117 U/L 158(H) 207(H) 289(H)  Total Bilirubin 0.2 - 1.2 mg/dL 0.8 0.9 0.8  Bilirubin, Direct 0.0 - 0.3 mg/dL 0.2 0.2 0.2    Assessment:  #1 Sarcoidosis, stage III. She is currently on a slow steroid taper. Prednisone is at 10 mg per day. We will reassess with a chest x-ray and liver function tests. She'll continue the prednisone at current level at her follow-up in 2 months to determine if  she'll tolerate another reduction in dose.  I have discussed her case with Dr. Havery Moros in past. Since her LFTs are better and there is no clear evidence of flareup of her pulmonary sarcoid our plan is to continue this taper. She continues on OTC vitamin D supplementation and PCP prophylaxis with Bactrim.  #2 Asthmatic bronchitis. She is doing well on the Qvar inhaler. We will send in refills  #3 Cough She does have chronic nonproductive cough.She is already on Flonase nasal spray for rhinitis and Protonix for GERD.  #4 Chronic generalized body ache She has responded to naproxen in the past. We've given a prescription for when necessary naproxen.    Plan/Recommendations: - Continue prednisone at 10 mg for now. - Continue qvar - CXR, CBC and CMP labs today.  - Continue Ca, Vit D and bactrim     Marshell Garfinkel MD [facility] Pager [phone] 06/02/2016, 10:25 AM  CC: Laqueta Due MD

## 2016-06-10 ENCOUNTER — Telehealth: Payer: Self-pay | Admitting: Pulmonary Disease

## 2016-06-10 DIAGNOSIS — D869 Sarcoidosis, unspecified: Principal | ICD-10-CM

## 2016-06-10 DIAGNOSIS — D860 Sarcoidosis of lung: Secondary | ICD-10-CM

## 2016-06-10 NOTE — Progress Notes (Signed)
Lmtcb on pt voicemail.

## 2016-06-10 NOTE — Telephone Encounter (Signed)
lmomtcb x1 

## 2016-06-11 NOTE — Progress Notes (Signed)
See 06/10/16 phone note. 

## 2016-06-11 NOTE — Telephone Encounter (Signed)
Per cxr results: Result Notes   Notes Recorded by Tyson Dense, RN on 06/10/2016 at 11:29 AM EST Lmtcb on pt voicemail. ------  Notes Recorded by Marshell Garfinkel, MD on 06/04/2016 at 9:50 AM EST Please let the patient know that her CXR is normal. Her labs were also reviewed. There is an increase in her glucose to 400s. Ask her to increase the insulin dose and follow with her primary care.  I wanted LFTs with a comprehensive met panel but she got the basic met panel only. Please reorder a comprehensive metabolic panel. Thanks   Called spoke with patient and discussed results/recs as stated by PM.  Pt voiced her understanding and will be able to come by tomorrow 1.26.18 for the CBCD and LFTs that were not drawn at ov (the CBCD and CMET were indeed ordered at the visit but not drawn by the lab).  Pt also stated that she will communicate with her PCP regarding her elevated blood sugar. Orders placed; pt is aware we will call her once results are available.  Nothing further needed; will sign off.

## 2016-06-15 ENCOUNTER — Other Ambulatory Visit (INDEPENDENT_AMBULATORY_CARE_PROVIDER_SITE_OTHER): Payer: Medicaid Other

## 2016-06-15 DIAGNOSIS — D869 Sarcoidosis, unspecified: Secondary | ICD-10-CM | POA: Diagnosis not present

## 2016-06-15 DIAGNOSIS — D860 Sarcoidosis of lung: Secondary | ICD-10-CM

## 2016-06-15 LAB — CBC WITH DIFFERENTIAL/PLATELET
BASOS ABS: 0.1 10*3/uL (ref 0.0–0.1)
BASOS PCT: 1.4 % (ref 0.0–3.0)
Eosinophils Absolute: 0.1 10*3/uL (ref 0.0–0.7)
Eosinophils Relative: 1.6 % (ref 0.0–5.0)
HEMATOCRIT: 42.4 % (ref 36.0–46.0)
Hemoglobin: 14.7 g/dL (ref 12.0–15.0)
LYMPHS ABS: 3.4 10*3/uL (ref 0.7–4.0)
LYMPHS PCT: 45.7 % (ref 12.0–46.0)
MCHC: 34.7 g/dL (ref 30.0–36.0)
MCV: 87.3 fl (ref 78.0–100.0)
MONOS PCT: 8.1 % (ref 3.0–12.0)
Monocytes Absolute: 0.6 10*3/uL (ref 0.1–1.0)
NEUTROS ABS: 3.2 10*3/uL (ref 1.4–7.7)
NEUTROS PCT: 43.2 % (ref 43.0–77.0)
PLATELETS: 273 10*3/uL (ref 150.0–400.0)
RBC: 4.86 Mil/uL (ref 3.87–5.11)
RDW: 12.3 % (ref 11.5–15.5)
WBC: 7.5 10*3/uL (ref 4.0–10.5)

## 2016-06-15 LAB — HEPATIC FUNCTION PANEL
ALBUMIN: 4.1 g/dL (ref 3.5–5.2)
ALK PHOS: 120 U/L — AB (ref 39–117)
ALT: 13 U/L (ref 0–35)
AST: 17 U/L (ref 0–37)
Bilirubin, Direct: 0.2 mg/dL (ref 0.0–0.3)
TOTAL PROTEIN: 7.7 g/dL (ref 6.0–8.3)
Total Bilirubin: 0.6 mg/dL (ref 0.2–1.2)

## 2016-06-17 ENCOUNTER — Encounter: Payer: Self-pay | Admitting: Internal Medicine

## 2016-06-17 ENCOUNTER — Ambulatory Visit (INDEPENDENT_AMBULATORY_CARE_PROVIDER_SITE_OTHER): Payer: Medicaid Other | Admitting: Internal Medicine

## 2016-06-17 VITALS — BP 142/86 | HR 78 | Temp 98.4°F | Wt 176.0 lb

## 2016-06-17 DIAGNOSIS — R29898 Other symptoms and signs involving the musculoskeletal system: Secondary | ICD-10-CM | POA: Diagnosis not present

## 2016-06-17 LAB — TSH: TSH: 1.27 m[IU]/L

## 2016-06-17 NOTE — Progress Notes (Signed)
   Subjective:    [name] - 46 y.o. female MRN [id number]  Date of birth: 01-21-1971  HPI  [name] is here for leg weakness.  Leg Weakness: Bilaterally. Reports this has been occurring for several years and that she helps use cane to ambulate. However, over the past month this has been worsening. She has needed assistance a couple times getting on and off the toilet as well as getting her legs into bed. She reports chronic tingling/numbness of her toes followed by podiatry secondary to diabetic neuropathy but denies new sensory changes in the lower extremities. No weakness or numbness of her upper extremities. No back pain present. No bowel or bladder incontinence. No speech changes or confusion.   -  reports that she has never smoked. She has never used smokeless tobacco. - Review of Systems: Per HPI. - Past Medical History: Patient Active Problem List   Diagnosis Date Noted  . Juvenile myoclonic epilepsy ([place]) 03/23/2016  . Occipital neuralgia of left side 03/05/2016  . Other headache syndrome 03/05/2016  . Sarcoidosis ([place])   . Gait disturbance 02/12/2015  . Abnormal liver function tests 02/08/2015  . 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 Fall   . Chronic fatigue 01/01/2015  . Nausea without vomiting 12/11/2014  . Dyspnea 12/10/2014  . Hypokalemia 11/24/2014  . Pressure ulcer 11/23/2014  . Weight loss   . Nausea with vomiting   . Elevated alkaline phosphatase level   . Slow transit constipation   . Torsades de pointes ([place]) 11/22/2014  . Traumatic hematoma of head 11/22/2014  . Long QT interval 11/06/2014  . Cough 09/03/2011  . INTERNAL HEMORRHOIDS WITHOUT MENTION COMP 02/25/2010  . PSVT 02/24/2010  . PRECORDIAL PAIN 08/22/2009  . Palpitations 10/05/2008  . Automatic implantable cardioverter-defibrillator in situ 10/05/2008  . Absence seizure disorder ([place]) 09/26/2008  . Constipation 02/08/2008  . ABDOMINAL PAIN,  PERIUMBILICAL 123456  . Sarcoidosis, stage 3 ([place]) 03/15/2007  . Diabetes mellitus, type 2 ([place]) 03/15/2007  . Essential hypertension 03/15/2007  . SYNDROME, LONG QT 03/15/2007  . ALLERGIC RHINITIS 03/15/2007  . OBSTRUCTIVE CHRONIC BRONCHITIS 03/15/2007  . GERD 03/15/2007  . ESOPHAGITIS 02/05/2004  . ESOPHAGEAL STRICTURE 02/05/2004   - Medications: reviewed and updated   Objective:   Physical Exam BP (!) 142/86   Pulse 78   Temp 98.4 F (36.9 C) (Oral)   Wt 176 lb (79.8 kg)   LMP 07/23/2014   BMI 28.41 kg/m  Gen: NAD, alert, cooperative with exam, well-appearing Lower extremities: Patella reflex 1/4 bilaterally. Achilles reflex 2/4 bilaterally. Sensation to lower extremities grossly intact. Strength 4+/5 bilaterally. Able to bear weight but walks with a slow, antalgic gait.     Assessment & Plan:   No problem-specific Assessment & Plan notes found for this encounter.    Phill Myron, D.O. 06/17/2016, 2:22 PM PGY-2, [place]

## 2016-06-17 NOTE — Patient Instructions (Addendum)
We will check some labs today and I have referred you to physical therapy.   If your symptoms worsen significantly or you have any incontinence please go the ER immediately.

## 2016-06-17 NOTE — Assessment & Plan Note (Signed)
Precepted patient with Dr. Gwendlyn Deutscher who also evaluated patient and found no reason for patient to have emergent medical care. Doubt CVA given bilateral lower extremity weakness for prolonged time period without other neurological changes. Doubt cauda equina given lack of back pain, saddle anesthesia, and incontinence. Will obtain labs to evaluate for myositis and autoimmune causes of weakness. May be related to prolonged deconditioning. PT referral placed. Patient to follow up in clinic in one week.

## 2016-06-18 LAB — RHEUMATOID FACTOR

## 2016-06-18 LAB — ANA: Anti Nuclear Antibody(ANA): POSITIVE — AB

## 2016-06-18 LAB — CK: Total CK: 71 U/L (ref 7–177)

## 2016-06-18 LAB — ANTI-NUCLEAR AB-TITER (ANA TITER): ANA Titer 1: 1:80 {titer} — ABNORMAL HIGH

## 2016-06-18 LAB — SEDIMENTATION RATE: Sed Rate: 22 mm/hr — ABNORMAL HIGH (ref 0–20)

## 2016-06-18 LAB — C-REACTIVE PROTEIN: CRP: 9.7 mg/L — ABNORMAL HIGH (ref <8.0)

## 2016-06-18 NOTE — Progress Notes (Signed)
LMOMTCB x 1 

## 2016-06-19 ENCOUNTER — Telehealth: Payer: Self-pay | Admitting: Internal Medicine

## 2016-06-19 ENCOUNTER — Telehealth: Payer: Self-pay | Admitting: Pulmonary Disease

## 2016-06-19 DIAGNOSIS — R768 Other specified abnormal immunological findings in serum: Secondary | ICD-10-CM

## 2016-06-19 NOTE — Telephone Encounter (Signed)
Pt called back and she is aware of lab results per PM. Nothing further is needed.

## 2016-06-19 NOTE — Telephone Encounter (Signed)
lmomtcb x1 

## 2016-06-19 NOTE — Telephone Encounter (Signed)
Returned patient's call and was able to discuss lab results.   Phill Myron, D.O. 06/19/2016, 3:35 PM PGY-2, [place]

## 2016-06-19 NOTE — Telephone Encounter (Signed)
Attempted to call patient regarding lab results. Have placed referral to rheumatology for elevated ANA titer of 1:80 in addition to elevated CRP and ESR in the setting of lower extremity weakness. Per chart review, patient has reported diagnosis of lupus many years ago but has never been followed by rheumatology.   Phill Myron, D.O. 06/19/2016, 1:38 PM PGY-2, [place]

## 2016-06-19 NOTE — Telephone Encounter (Signed)
Pt would like for you to call her back please. ep

## 2016-06-22 ENCOUNTER — Telehealth: Payer: Self-pay | Admitting: Cardiology

## 2016-06-22 ENCOUNTER — Ambulatory Visit (INDEPENDENT_AMBULATORY_CARE_PROVIDER_SITE_OTHER): Payer: Medicaid Other | Admitting: *Deleted

## 2016-06-22 DIAGNOSIS — I4581 Long QT syndrome: Secondary | ICD-10-CM

## 2016-06-22 NOTE — Telephone Encounter (Signed)
Spoke with pt and reminded pt of remote transmission that is due today. Pt verbalized understanding.   

## 2016-06-23 ENCOUNTER — Ambulatory Visit: Payer: Medicaid Other | Admitting: Internal Medicine

## 2016-06-23 LAB — CUP PACEART REMOTE DEVICE CHECK
HIGH POWER IMPEDANCE MEASURED VALUE: 46 Ohm
HIGH POWER IMPEDANCE MEASURED VALUE: 71 Ohm
Implantable Lead Implant Date: 20080828
Implantable Lead Location: 753860
Lead Channel Pacing Threshold Amplitude: 1.375 V
Lead Channel Pacing Threshold Pulse Width: 0.4 ms
Lead Channel Sensing Intrinsic Amplitude: 5.625 mV
Lead Channel Setting Pacing Pulse Width: 0.4 ms
MDC IDC MSMT BATTERY REMAINING LONGEVITY: 130 mo
MDC IDC MSMT BATTERY VOLTAGE: 3.04 V
MDC IDC MSMT LEADCHNL RV IMPEDANCE VALUE: 342 Ohm
MDC IDC MSMT LEADCHNL RV IMPEDANCE VALUE: 399 Ohm
MDC IDC MSMT LEADCHNL RV SENSING INTR AMPL: 5.625 mV
MDC IDC PG IMPLANT DT: 20161117
MDC IDC SESS DTM: 20180205205010
MDC IDC SET LEADCHNL RV PACING AMPLITUDE: 3 V
MDC IDC SET LEADCHNL RV SENSING SENSITIVITY: 0.3 mV
MDC IDC STAT BRADY RV PERCENT PACED: 0.01 %

## 2016-06-23 NOTE — Progress Notes (Signed)
Remote ICD transmission.   

## 2016-06-24 ENCOUNTER — Encounter: Payer: Self-pay | Admitting: Cardiology

## 2016-06-25 ENCOUNTER — Telehealth: Payer: Self-pay | Admitting: Internal Medicine

## 2016-06-25 NOTE — Telephone Encounter (Signed)
Dr Estanislado Pandy denied the referral

## 2016-06-29 ENCOUNTER — Ambulatory Visit: Payer: Medicaid Other | Admitting: Physical Therapy

## 2016-07-08 ENCOUNTER — Ambulatory Visit (INDEPENDENT_AMBULATORY_CARE_PROVIDER_SITE_OTHER): Payer: Medicaid Other | Admitting: Internal Medicine

## 2016-07-08 ENCOUNTER — Encounter: Payer: Self-pay | Admitting: Internal Medicine

## 2016-07-08 VITALS — BP 122/70 | HR 69 | Temp 98.6°F | Ht 66.0 in | Wt 175.0 lb

## 2016-07-08 DIAGNOSIS — Z794 Long term (current) use of insulin: Secondary | ICD-10-CM

## 2016-07-08 DIAGNOSIS — E118 Type 2 diabetes mellitus with unspecified complications: Secondary | ICD-10-CM

## 2016-07-08 DIAGNOSIS — R29898 Other symptoms and signs involving the musculoskeletal system: Secondary | ICD-10-CM

## 2016-07-08 LAB — POCT GLYCOSYLATED HEMOGLOBIN (HGB A1C): Hemoglobin A1C: 11

## 2016-07-08 NOTE — Patient Instructions (Signed)
Good to see you today [name]! We will touch base with you after the rheumatologist appointment.   Please make an appointment with Dr. Valentina Lucks, pharmacist, to discuss your diabetes. I would try to schedule with him in the next 1-2 weeks. Please bring a blood sugar log to this visit.

## 2016-07-08 NOTE — Progress Notes (Signed)
Subjective:    [name] - 46 y.o. female MRN [id number]  Date of birth: [birth year]  HPI  [name] is here for follow up of chronic medical conditions.   T2DM: Patient is taking Lantus 35 units nightly. Takes Novolog twice per day with her largest meals. Usually takes 3 units but sometimes takes 4 units based on how she is feeling. Does not monitor her CBGs frequently and does not present with a log today. Reports that she has previously been on Metformin and Amaryl but they were discontinued due to GI upset. She was on Januvia a very long time ago. Endorses polyuria and noctruia. Denies nausea/vomiting.   Lower extremity weakness: Has improved somewhat from previous office visit. Reports she only needed help getting out of bed once since she last saw me. Denies lower back pain, saddle anesthesia, and bowel/bladder incontinence.    -  reports that she has never smoked. She has never used smokeless tobacco. - Review of Systems: Per HPI. - Past Medical History: Patient Active Problem List   Diagnosis Date Noted  . Lower extremity weakness 06/17/2016  . Juvenile myoclonic epilepsy ([place]) 03/23/2016  . Occipital neuralgia of left side 03/05/2016  . Other headache syndrome 03/05/2016  . Sarcoidosis ([place])   . Gait disturbance 02/12/2015  . Abnormal liver function tests 02/08/2015  . 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 Fall   . Chronic fatigue 01/01/2015  . Nausea without vomiting 12/11/2014  . Dyspnea 12/10/2014  . Hypokalemia 11/24/2014  . Pressure ulcer 11/23/2014  . Weight loss   . Nausea with vomiting   . Elevated alkaline phosphatase level   . Slow transit constipation   . Torsades de pointes ([place]) 11/22/2014  . Traumatic hematoma of head 11/22/2014  . Long QT interval 11/06/2014  . Cough 09/03/2011  . INTERNAL HEMORRHOIDS WITHOUT MENTION COMP 02/25/2010  . PSVT 02/24/2010  . PRECORDIAL PAIN 08/22/2009  . Palpitations  10/05/2008  . Automatic implantable cardioverter-defibrillator in situ 10/05/2008  . Absence seizure disorder ([place]) 09/26/2008  . Constipation 02/08/2008  . ABDOMINAL PAIN, PERIUMBILICAL 123456  . Sarcoidosis, stage 3 ([place]) 03/15/2007  . Diabetes mellitus, type 2 ([place]) 03/15/2007  . Essential hypertension 03/15/2007  . SYNDROME, LONG QT 03/15/2007  . ALLERGIC RHINITIS 03/15/2007  . OBSTRUCTIVE CHRONIC BRONCHITIS 03/15/2007  . GERD 03/15/2007  . ESOPHAGITIS 02/05/2004  . ESOPHAGEAL STRICTURE 02/05/2004   - Medications: reviewed and updated   Objective:   Physical Exam BP 122/70   Pulse 69   Temp 98.6 F (37 C) (Oral)   Ht 5\' 6"  (1.676 m)   Wt 175 lb (79.4 kg)   LMP 07/23/2014   BMI 28.25 kg/m  Gen: NAD, alert, cooperative with exam, well-appearing CV: RRR, good S1/S2, no murmur, no edema, capillary refill brisk  Resp: CTABL, no wheezes, non-labored Lower extremities: Sensation to lower extremities grossly intact. Strength 4+/5 bilaterally. Able to bear weight. Walks with assistance of a cane.   Assessment & Plan:   Diabetes mellitus, type 2 Uncontrolled with A1C of 11. Patient could likely benefit from dose adjustment of her insulin and closer dose equality between long acting and short acting insulin. Have asked patient to schedule appointment with Dr. Valentina Lucks for further medication management. Emphasized importance of blood sugar log for at least 1 week prior to this visit.   Lower extremity weakness With elevated ANA and reported history of Lupus. Patient has appointment with rheumatology on 3/1. No red  flags on history or exam. Will follow up after rheum visit. May be able to reorder PT referral depending on rheum evaluation.     Phill Myron, D.O. 07/08/2016, 4:28 PM PGY-2, [place]

## 2016-07-08 NOTE — Assessment & Plan Note (Signed)
Uncontrolled with A1C of 11. Patient could likely benefit from dose adjustment of her insulin and closer dose equality between long acting and short acting insulin. Have asked patient to schedule appointment with Dr. Valentina Lucks for further medication management. Emphasized importance of blood sugar log for at least 1 week prior to this visit.

## 2016-07-08 NOTE — Assessment & Plan Note (Signed)
With elevated ANA and reported history of Lupus. Patient has appointment with rheumatology on 3/1. No red flags on history or exam. Will follow up after rheum visit. May be able to reorder PT referral depending on rheum evaluation.

## 2016-07-16 ENCOUNTER — Ambulatory Visit: Payer: Medicaid Other | Admitting: Neurology

## 2016-07-16 DIAGNOSIS — R7982 Elevated C-reactive protein (CRP): Secondary | ICD-10-CM | POA: Insufficient documentation

## 2016-07-16 DIAGNOSIS — R269 Unspecified abnormalities of gait and mobility: Secondary | ICD-10-CM | POA: Diagnosis not present

## 2016-07-16 DIAGNOSIS — R768 Other specified abnormal immunological findings in serum: Secondary | ICD-10-CM | POA: Diagnosis not present

## 2016-07-20 ENCOUNTER — Encounter: Payer: Self-pay | Admitting: Neurology

## 2016-07-20 ENCOUNTER — Ambulatory Visit (INDEPENDENT_AMBULATORY_CARE_PROVIDER_SITE_OTHER): Payer: Medicaid Other | Admitting: Neurology

## 2016-07-20 VITALS — BP 130/86 | HR 74 | Resp 16 | Ht 66.0 in | Wt 175.0 lb

## 2016-07-20 DIAGNOSIS — R269 Unspecified abnormalities of gait and mobility: Secondary | ICD-10-CM | POA: Diagnosis not present

## 2016-07-20 DIAGNOSIS — R29898 Other symptoms and signs involving the musculoskeletal system: Secondary | ICD-10-CM | POA: Diagnosis not present

## 2016-07-20 DIAGNOSIS — R5382 Chronic fatigue, unspecified: Secondary | ICD-10-CM | POA: Diagnosis not present

## 2016-07-20 DIAGNOSIS — G40B09 Juvenile myoclonic epilepsy, not intractable, without status epilepticus: Secondary | ICD-10-CM

## 2016-07-20 DIAGNOSIS — D869 Sarcoidosis, unspecified: Secondary | ICD-10-CM

## 2016-07-20 DIAGNOSIS — M542 Cervicalgia: Secondary | ICD-10-CM | POA: Diagnosis not present

## 2016-07-20 MED ORDER — LEVETIRACETAM 500 MG PO TABS: 500.0000 mg | ORAL_TABLET | Freq: Two times a day (BID) | ORAL | 5 refills | Status: DC

## 2016-07-20 MED ORDER — LAMOTRIGINE 150 MG PO TABS: 150.0000 mg | ORAL_TABLET | Freq: Two times a day (BID) | ORAL | 5 refills | Status: DC

## 2016-07-20 NOTE — Progress Notes (Signed)
[facility]  PATIENT: [name] DOB: [birth year]  REFERRING DOCTOR OR Loretta:  [name]; [name]; [name] SOURCE: patient, note from Loretta., Loretta and Pulmonary, CT report, labs, CT images on PACS  _________________________________   HISTORICAL  CHIEF COMPLAINT:  Chief Complaint  Patient presents with  . Headaches    Sts. headaches are more frequent, tend to linger on the left side of her head.  No h/a today..  Sts. she increased Lamictal to 167m bid, but is still having staring spells.  Sts. the other night, she sort of jerked herself awake, found that she had bitten her lip.  No incontinence/fim  . Seizures    HISTORY OF PRESENT ILLNESS:  [name] a 46year old woman with Stage III sarcoidosis, headaches and staring spells.      Seizures:  She is currently on lamotrigine 100 mg twice a day.  She reports continued staring spells, no better than initially. She had one episode out of sleep where she jerked and bit her lip but no incontinence.   EEG 3 Hz spike-wave activity intermittently. 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   Seizure History:   She was diagnosed with seizures 12-15 years ago based on episodes of memory loss and decreased responsiveness for a minute or so.   She reports an EEG was abnormal.    Seizures are also in uncles x 2, aunt, maternal grandmother and some grand-aunts.   As a teenager, she also began to have some myoclonic episodes in the morning. She continues to have these.   She has never had a GTC seizure.   EEG November 2017 shows 3 Hz spike wave activity intermittently consistent with juvenile myoclonic epilepsy.  Sarcoid:  She had pulmonary sarcoid.  She sees LGarment/textile technologist   Weakness/neck pain:   She notes more weakness in her legs, left > right.   She sometimes drags the left leg.   She also notes pain in her legs, worse with sitting.   She notes some  numbness in her feet and needs to keep eyes open to walk (x years).    She has had some urinary frequency.   She has had some neck pain.  HA:   She is still experiencing headaches 2-3 days a week. The rest of the time she often has a dull pain on the left.  When she gets a headache, it is always on the left side and pounding. More severe pain is in the forehead and the side or back of the head and radiate forward. When she gets the headache she will have nausea, occasional vomiting, photophobia and phonophobia. Moving her head will worsen the pain.   Sometimes she has nausea without headache.   Laying down in a cold quiet dark room will sometimes help.     She has had some headaches since adolescence but they are more common now.     An occipital nerve block has not helped much in the past.  Noncontrasted CT scan 11/22/2014 showed brain was normal.   It was done after she passed out and hit her head.   She has an AICD and has not had an MRI of the brain.      REVIEW OF SYSTEMS: Constitutional: No fevers, chills, sweats, or change in appetite Eyes: No visual changes, double vision, eye pain Ear, nose and throat: No hearing loss, ear pain, nasal congestion, sore throat Cardiovascular: No chest  pain, palpitations Respiratory: No shortness of breath at rest or with exertion.   No wheezes.  Has sarcoid GastrointestinaI: No nausea, vomiting, diarrhea, abdominal pain, fecal incontinence Genitourinary: No dysuria, urinary retention or frequency.  No nocturia. Musculoskeletal: No neck pain, back pain Integumentary: No rash, pruritus, skin lesions Neurological: as above Psychiatric: No depression at this time.  No anxiety Endocrine: No palpitations, diaphoresis, change in appetite, change in weigh or increased thirst Hematologic/Lymphatic: No anemia, purpura, petechiae. Allergic/Immunologic: No itchy/runny eyes, nasal congestion, recent allergic reactions, rashes  ALLERGIES: Allergies  Allergen  Reactions  . Contrast Media [Iodinated Diagnostic Agents] Anaphylaxis and Shortness Of Breath    Needed to be defibrillated and patient was pre-medicated with radiology 13 hour premeds.   . Augmentin [Amoxicillin-Pot Clavulanate] Other (See Comments)    "stomach hurt"  . Codeine Nausea And Vomiting    jittery  . Demerol [Meperidine]   . Iohexol      Code: RASH, Desc: PT STATES SHE IS ALLERGIC TO IV CONTRAST 05/28/06/RM, Onset Date: [id number]   . Morphine And Related Nausea And Vomiting    HOME MEDICATIONS:  Current Outpatient Prescriptions:  .  ACCU-CHEK SOFTCLIX LANCETS lancets, Test Blood Sugar twice a day.  ICD-10 code E11.9., Disp: 100 each, Rfl: 12 .  acetaminophen (TYLENOL) 500 MG tablet, Take 1,000 mg by mouth every 6 (six) hours as needed for mild pain or fever. Reported on 07/22/2015, Disp: , Rfl:  .  beclomethasone (QVAR) 80 MCG/ACT inhaler, Inhale 2 puffs into the lungs 2 (two) times daily., Disp: 1 Inhaler, Rfl: 5 .  Blood Glucose Monitoring Suppl (ACCU-CHEK AVIVA PLUS) w/Device KIT, Test blood sugar once fasting and once ICD-10  Code  E11.9, Disp: 1 kit, Rfl: 0 .  clonazePAM (KLONOPIN) 0.5 MG tablet, Take 1 tablet (0.5 mg total) by mouth 2 (two) times daily as needed for anxiety., Disp: 60 tablet, Rfl: 3 .  cyclobenzaprine (FLEXERIL) 10 MG tablet, Take 1 tablet (10 mg total) by mouth 3 (three) times daily as needed for muscle spasms. Reported on 11/29/2015, Disp: 30 tablet, Rfl: 0 .  cycloSPORINE (RESTASIS) 0.05 % ophthalmic emulsion, Place 1 drop into both eyes 2 (two) times daily., Disp: 0.4 mL, Rfl: 0 .  fluticasone (FLONASE) 50 MCG/ACT nasal spray, Place 2 sprays into both nostrils daily as needed for allergies. , Disp: , Rfl:  .  gabapentin (NEURONTIN) 300 MG capsule, Take 1 capsule (300 mg total) by mouth 3 (three) times daily., Disp: 90 capsule, Rfl: 2 .  glucose blood (ACCU-CHEK AVIVA PLUS) test strip, Test Blood Sugar twice a day. ICD-10 code E11.9, Disp: 100 each, Rfl:  12 .  ibuprofen (ADVIL,MOTRIN) 600 MG tablet, Take 1 tablet by mouth every 8 (eight) hours as needed., Disp: , Rfl: 0 .  Insulin Glargine (LANTUS SOLOSTAR) 100 UNIT/ML Solostar Pen, Inject 40 Units into the skin at bedtime., Disp: 30 mL, Rfl: 0 .  lactulose (CHRONULAC) 10 GM/15ML solution, Take 30 ml TID, Disp: 2700 mL, Rfl: 2 .  lamoTRIgine (LAMICTAL) 150 MG tablet, Take 1 tablet (150 mg total) by mouth 2 (two) times daily., Disp: 60 tablet, Rfl: 5 .  Lancet Devices (ACCU-CHEK SOFTCLIX) lancets, Test Blood Sugar twice a day. ICD-10 code E11.9., Disp: 1 each, Rfl: 0 .  levETIRAcetam (KEPPRA) 500 MG tablet, Take 1 tablet (500 mg total) by mouth 2 (two) times daily., Disp: 60 tablet, Rfl: 5 .  Linaclotide (LINZESS) 145 MCG CAPS capsule, Take 1 capsule (145 mcg total) by mouth daily., Disp:  30 capsule, Rfl: 3 .  meclizine (ANTIVERT) 25 MG tablet, Take 0.5 tablets (12.5 mg total) by mouth 3 (three) times daily as needed for dizziness., Disp: 30 tablet, Rfl: 0 .  naproxen (NAPROSYN) 375 MG tablet, Take 1 tablet (375 mg total) by mouth 2 (two) times daily with a meal., Disp: 60 tablet, Rfl: 3 .  NOVOLOG FLEXPEN 100 UNIT/ML FlexPen, Inject 4-8 Units into the skin 3 (three) times daily., Disp: 15 mL, Rfl: 0 .  ondansetron (ZOFRAN) 4 MG tablet, Take 1 tablet (4 mg total) by mouth every 6 (six) hours as needed for nausea or vomiting., Disp: 60 tablet, Rfl: 1 .  pantoprazole (PROTONIX) 40 MG tablet, Take 1 tablet (40 mg total) by mouth daily at 6 (six) AM., Disp: 30 tablet, Rfl: 2 .  PAZEO 0.7 % SOLN, instill 1 drop into both eyes once daily, Disp: , Rfl: 0 .  potassium chloride SA (K-DUR,KLOR-CON) 20 MEQ tablet, Take 1 tablet (20 mEq total) by mouth daily., Disp: 30 tablet, Rfl: 2 .  predniSONE (DELTASONE) 10 MG tablet, Take 1 tablet (10 mg total) by mouth daily with breakfast., Disp: 30 tablet, Rfl: 3 .  propranolol (INDERAL) 80 MG tablet, Take 1 tablet (80 mg total) by mouth 2 (two) times daily., Disp: 30  tablet, Rfl: 11 .  sulfamethoxazole-trimethoprim (BACTRIM DS) 800-160 MG tablet, Take 1 tablet by mouth 3 (three) times a week., Disp: 12 tablet, Rfl: 5  PAST MEDICAL HISTORY: Past Medical History:  Diagnosis Date  . Abdominal pain, periumbilic   . Allergic rhinitis   . Arthritis   . Asthma   . Automatic implantable cardioverter-defibrillator in situ 2006, replaced 2008   [employer]  . Bronchitis   . Cancer ([place])    Nodules in lungs pt believes were cancerous pt unsure  . DM (diabetes mellitus) ([place]) 1997/1998   type 1, initially gestational but soon after child birth developed IDDM  . Esophageal stricture 2005/2009   esophageal strictures dilated 2005, 2009  . Eye hemorrhage    bilateral  . Fibroid, uterine   . Fibromyalgia   . GERD (gastroesophageal reflux disease) 2005  . History of blood transfusion years ago  . Internal hemorrhoids 2010   2011 band ligation of int rrhoids.  . Long Q-T syndrome   . Neuropathy in diabetes ([place])   . Palpitation   . Sarcoidosis ([place])   . Seizure disorder ([place])    none recently  . Seizures ([place])   . Stroke ([place])    QUESTIONABLE TIA     PAST SURGICAL HISTORY: Past Surgical History:  Procedure Laterality Date  . AUTOMATIC IMPLANTABLE CARDIAC DEFIBRILLATOR SITU  2006,    ICD-Medtronic   Remote - Yes   . CERVIX REMOVAL    . DILITATION & CURRETTAGE/HYSTROSCOPY WITH HYDROTHERMAL ABLATION N/A 06/07/2013   Procedure: DILATATION & CURETTAGE/HYSTEROSCOPY WITH attempted HYDROTHERMAL ABLATION;  Surgeon: Frederico Hamman, MD;  Location: [facility];  Service: Gynecology;  Laterality: N/A;  . EP IMPLANTABLE DEVICE N/A 04/04/2015   Procedure:  ICD Generator Changeout;  Surgeon: Evans Lance, MD;  Location: [facility];  Service: Cardiovascular;  Laterality: N/A;  . ESOPHAGOGASTRODUODENOSCOPY (EGD) WITH PROPOFOL N/A 10/24/2014   Procedure: ESOPHAGOGASTRODUODENOSCOPY (EGD) WITH PROPOFOL;  Surgeon: Inda Castle, MD;  Location: [facility];   Service: Endoscopy;  Laterality: N/A;  . EXPLORATORY LAPAROTOMY  11/06/14   evacuation of post TAH/BSO hematoma  . HAND SURGERY Right    x 2  . HEMORRHOID BANDING  03/2010  . SALPINGECTOMY    . SVT ablation    . TOTAL ABDOMINAL HYSTERECTOMY W/ BILATERAL SALPINGOOPHORECTOMY  11/06/14   at [facility] for menorrhagia, pelvic pain and enlarging uterine fibroids  . TUBAL LIGATION    . VIDEO BRONCHOSCOPY Bilateral 01/24/2015   Procedure: VIDEO BRONCHOSCOPY WITHOUT FLUORO;  Surgeon: Marshell Garfinkel, MD;  Location: [facility];  Service: Cardiopulmonary;  Laterality: Bilateral;    FAMILY HISTORY: Family History  Problem Relation Age of Onset  . Heart attack Mother   . Lung cancer Maternal Uncle     x2  . Breast cancer Maternal Aunt     x2  . Stomach cancer Paternal Uncle   . Stomach cancer Paternal Grandfather   . Stomach cancer Paternal Grandmother   . Diabetes Father   . Positive PPD/TB Exposure Father   . Glaucoma Father   . Colon cancer Neg Hx     SOCIAL HISTORY:  Social History   Social History  . Marital status: Single    Spouse name: N/A  . Number of children: N/A  . Years of education: N/A   Occupational History  . Not on file.   Social History Main Topics  . Smoking status: Never Smoker  . Smokeless tobacco: Never Used  . Alcohol use 0.0 oz/week     Comment: occ.  . Drug use: No  . Sexual activity: No   Other Topics Concern  . Not on file   Social History Narrative  . No narrative on file     PHYSICAL EXAM  Vitals:   07/20/16 1144  BP: 130/86  Pulse: 74  Resp: 16  Weight: 175 lb (79.4 kg)  Height: _0  (1.676 m)    Body mass index is 28.25 kg/m.   General: The patient is well-developed and well-nourished and in no acute distress  Neck: The neck is supple, no carotid bruits are noted.  The neck is very tender at the left occiput and pain radiates forward.   Neurologic Exam  Mental status: The patient is alert and oriented x 3 at the time of  the examination. The patient has apparent normal recent and remote memory, with an apparently normal attention span and concentration ability.   Speech is normal.  Cranial nerves: Extraocular movements are full.   Facial symmetry is present. There is good facial sensation to soft touch bilaterally.Facial strength is normal.  Trapezius and sternocleidomastoid strength is normal. No dysarthria is noted.  The tongue is midline, and the patient has symmetric elevation of the soft palate. No obvious hearing deficits are noted.  Motor:  Muscle bulk is normal.   Tone is normal. Strength is  4+/5 proximally in legs and in toe extension and 5 / 5 in other muscles.   Sensory: Sensory testing is intact to  soft touch in arms, hands and proximal legs.      Coordination: Cerebellar testing reveals good finger-nose-finger bilaterally.  Gait and station: She is only able to rise up once from a chair without using her arms. Station is normal.   Gait is mildly wide and tandem is wide. Romberg is negative.   Reflexes: Deep tendon reflexes are symmetric and normal bilaterally.      DIAGNOSTIC DATA (LABS, IMAGING, TESTING) - I reviewed patient records, labs, notes, testing and imaging myself where available.  Lab Results  Component Value Date   WBC 7.5 06/15/2016   HGB 14.7 06/15/2016   HCT 42.4 06/15/2016   MCV 87.3 06/15/2016   PLT  273.0 06/15/2016      Component Value Date/Time   NA 131 (L) 06/02/2016 1059   K 3.4 (L) 06/02/2016 1059   CL 93 (L) 06/02/2016 1059   CO2 30 06/02/2016 1059   GLUCOSE 412 (H) 06/02/2016 1059   BUN 6 06/02/2016 1059   CREATININE 0.65 06/02/2016 1059   CREATININE 0.41 (L) 03/28/2015 0848   CALCIUM 9.4 06/02/2016 1059   PROT 7.7 06/15/2016 1140   ALBUMIN 4.1 06/15/2016 1140   AST 17 06/15/2016 1140   ALT 13 06/15/2016 1140   ALKPHOS 120 (H) 06/15/2016 1140   BILITOT 0.6 06/15/2016 1140   GFRNONAA >60 03/03/2015 1148   GFRAA >60 03/03/2015 1148   Lab Results    Component Value Date   CHOL 166 03/29/2015   HDL 14 (L) 12/24/2014   LDLCALC 61 12/24/2014   TRIG 236 (H) 12/24/2014   CHOLHDL 8.7 (H) 12/24/2014   Lab Results  Component Value Date   HGBA1C 11 07/08/2016   No results found for: VITAMINB12 Lab Results  Component Value Date   TSH 1.27 06/17/2016       ASSESSMENT AND PLAN  Nonintractable juvenile myoclonic epilepsy without status epilepticus (HCC)  Weakness of both lower extremities - Plan: CK, Acetylcholine receptor, binding, CT CERVICAL SPINE WO CONTRAST  Chronic fatigue  Gait disturbance - Plan: CT CERVICAL SPINE WO CONTRAST  Sarcoidosis (HCC)  Neck pain - Plan: CT CERVICAL SPINE WO CONTRAST    1.   Increase  lamotrigine to 150 mg twice a day and add Keppra 500 mg by mouth twice a day for the juvenile myoclonic epilepsy.   Hopefully this will also help her headaches. 2.   Continue other medications. 3.   CT scan of the cervical spine to evaluate her neck pain, leg weakness and gait disorder.  She is unable to get an MRI due to an AICD.   We will also check creatine kinase and an acetylcholine receptor antibody. 4.   She will return for a follow-up evaluation 3 months or call sooner if there are new or worsening neurologic symptoms. Dezyre Hoefer A. Felecia Shelling, MD, PhD 07/18/6120, 4:49 PM Certified in Neurology, Clinical Neurophysiology, Sleep Medicine, Pain Medicine and Neuroimaging  [facility] [address] [place], [place] [address] [phone]

## 2016-07-21 LAB — ACETYLCHOLINE RECEPTOR, BINDING

## 2016-07-21 LAB — CK: CK TOTAL: 100 U/L (ref 24–173)

## 2016-07-22 ENCOUNTER — Telehealth: Payer: Self-pay | Admitting: *Deleted

## 2016-07-22 NOTE — Telephone Encounter (Signed)
-----   Message from Britt Bottom, MD sent at 07/21/2016  5:38 PM EST ----- Please let her knlow the labwork for the muscle weakness was normal

## 2016-07-22 NOTE — Telephone Encounter (Signed)
LMOM that per RAS, labs checking for muscle weakness are normal.  She does not need to return this call unless she has questions/fim

## 2016-07-28 ENCOUNTER — Ambulatory Visit (INDEPENDENT_AMBULATORY_CARE_PROVIDER_SITE_OTHER)
Admission: RE | Admit: 2016-07-28 | Discharge: 2016-07-28 | Disposition: A | Discharge status: Home / Self Care | Payer: Medicaid Other | Source: Ambulatory Visit | Attending: Pulmonary Disease | Admitting: Pulmonary Disease

## 2016-07-28 ENCOUNTER — Ambulatory Visit (INDEPENDENT_AMBULATORY_CARE_PROVIDER_SITE_OTHER): Payer: Medicaid Other | Admitting: Pulmonary Disease

## 2016-07-28 ENCOUNTER — Encounter: Payer: Self-pay | Admitting: Pulmonary Disease

## 2016-07-28 VITALS — BP 130/76 | HR 84 | Ht 66.0 in | Wt 180.4 lb

## 2016-07-28 DIAGNOSIS — R059 Cough, unspecified: Secondary | ICD-10-CM

## 2016-07-28 DIAGNOSIS — R0602 Shortness of breath: Secondary | ICD-10-CM

## 2016-07-28 DIAGNOSIS — R05 Cough: Secondary | ICD-10-CM | POA: Diagnosis not present

## 2016-07-28 LAB — POCT INFLUENZA A/B
Influenza A, POC: NEGATIVE
Influenza B, POC: NEGATIVE

## 2016-07-28 MED ORDER — LEVOFLOXACIN 750 MG PO TABS: 750.0000 mg | ORAL_TABLET | Freq: Every day | ORAL | 0 refills | Status: DC

## 2016-07-28 NOTE — Progress Notes (Signed)
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 Asthmatic bronchitis Chronic cough Ventricular tachycardia s/p AICD. Long QT syndrome Left pleural effusion April 02, 2015. Improved>> Thoracentesis >>exudative   HPI: [name] is 46 year old with stage III sarcoidosis, asthmatic bronchitis, chronic cough. She is a former patient of Dr. Joya Gaskins. She was diagnosed around 2003. There is record of a transbronchial biopsy in 2003 but no granulomas were seen. She had been on and off systemic prednisone. She was admitted in September 2016 with sarcoidosis flare. Bronchial with BAL showed 10K staph aureus which was thought to be a contaminant. She was given Solu-Medrol 60 q6 and then discharged on a slow prednisone taper starting at 60 mg. She is also being followed by cardiology for prolonged QT syndrome, family history of sudden death. Her ICD was changed in Apr 02, 2015.   She underwent a thoracentesis in November 16 which showed exudative left-sided predominant effusion, and cultures were negative. Repeat CXR showed improvement in her effusion. She she had a liver biopsy early 2017 that showed hepatic sarcoidosis with elevated LFTs. She's been on a prolonged steroid taper with good response and her last LFTs have normalized. We reduced her prednisone to 30 mg in July and then to instructed her to reduce the dose to 20 mg in 04-01-2016. But for some reason she has actually reduced the dose to 10 mg/day. 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 She is currently on lamotrigine and clonazepam.    Interim History: Continuing on the slow prednisone taper. Her  prednisone was reduced to 10 mg earlier this year. She reports that her breathing has worsened since then with complaints of increased dyspnea, productive cough, clear white mucus production, wheezing, chest tightness. She reports temp of 101 at home.                         Outpatient Encounter Prescriptions as of 07/28/2016  Medication Sig  . ACCU-CHEK SOFTCLIX LANCETS lancets Test Blood Sugar twice a day.  ICD-10 code E11.9.  Marland Kitchen acetaminophen (TYLENOL) 500 MG tablet Take 1,000 mg by mouth every 6 (six) hours as needed for mild pain or fever. Reported on 07/22/2015  . beclomethasone (QVAR) 80 MCG/ACT inhaler Inhale 2 puffs into the lungs 2 (two) times daily.  . Blood Glucose Monitoring Suppl (ACCU-CHEK AVIVA PLUS) w/Device KIT Test blood sugar once fasting and once ICD-10  Code  E11.9  . clonazePAM (KLONOPIN) 0.5 MG tablet Take 1 tablet (0.5 mg total) by mouth 2 (two) times daily as needed for anxiety.  . cycloSPORINE (RESTASIS) 0.05 % ophthalmic emulsion Place 1 drop into both eyes 2 (two) times daily.  . fluticasone (FLONASE) 50 MCG/ACT nasal spray Place 2 sprays into both nostrils daily as needed for allergies.   Marland Kitchen gabapentin (NEURONTIN) 300 MG capsule Take 1 capsule (300 mg total) by mouth 3 (three) times daily.  Marland Kitchen glucose blood (ACCU-CHEK AVIVA PLUS) test strip Test Blood Sugar twice a day. ICD-10 code E11.9  . Insulin Glargine (LANTUS SOLOSTAR) 100 UNIT/ML Solostar Pen Inject 40 Units into the skin at bedtime.  Marland Kitchen lactulose (CHRONULAC) 10 GM/15ML solution Take 30  ml TID  . lamoTRIgine (LAMICTAL) 150 MG tablet Take 1 tablet (150 mg total) by mouth 2 (two) times daily.  Elmore Guise Devices (ACCU-CHEK SOFTCLIX) lancets Test Blood Sugar twice a day. ICD-10 code E11.9.  . levETIRAcetam (KEPPRA) 500 MG tablet Take 1 tablet (500 mg total) by mouth 2 (two) times daily.  . Linaclotide (LINZESS) 145 MCG CAPS capsule Take 1 capsule (145 mcg total) by mouth daily.  . meclizine (ANTIVERT) 25 MG tablet Take  0.5 tablets (12.5 mg total) by mouth 3 (three) times daily as needed for dizziness.  . naproxen (NAPROSYN) 375 MG tablet Take 1 tablet (375 mg total) by mouth 2 (two) times daily with a meal.  . NOVOLOG FLEXPEN 100 UNIT/ML FlexPen Inject 4-8 Units into the skin 3 (three) times daily.  . ondansetron (ZOFRAN) 4 MG tablet Take 1 tablet (4 mg total) by mouth every 6 (six) hours as needed for nausea or vomiting.  . pantoprazole (PROTONIX) 40 MG tablet Take 1 tablet (40 mg total) by mouth daily at 6 (six) AM.  . PAZEO 0.7 % SOLN instill 1 drop into both eyes once daily  . potassium chloride SA (K-DUR,KLOR-CON) 20 MEQ tablet Take 1 tablet (20 mEq total) by mouth daily.  . predniSONE (DELTASONE) 10 MG tablet Take 1 tablet (10 mg total) by mouth daily with breakfast.  . propranolol (INDERAL) 80 MG tablet Take 1 tablet (80 mg total) by mouth 2 (two) times daily.  Marland Kitchen sulfamethoxazole-trimethoprim (BACTRIM DS) 800-160 MG tablet Take 1 tablet by mouth 3 (three) times a week.  . [DISCONTINUED] cyclobenzaprine (FLEXERIL) 10 MG tablet Take 1 tablet (10 mg total) by mouth 3 (three) times daily as needed for muscle spasms. Reported on 11/29/2015  . [DISCONTINUED] ibuprofen (ADVIL,MOTRIN) 600 MG tablet Take 1 tablet by mouth every 8 (eight) hours as needed.   No facility-administered encounter medications on file as of 07/28/2016.     Allergies as of 07/28/2016 - Review Complete 07/28/2016  Allergen Reaction Noted  . Contrast media [iodinated diagnostic agents] Anaphylaxis and Shortness Of Breath 05/30/2013  . Augmentin [amoxicillin-pot clavulanate] Other (See Comments) 10/23/2014  . Codeine Nausea And Vomiting   . Demerol [meperidine]  07/22/2015  . Iohexol  05/28/2006  . Morphine and related Nausea And Vomiting 05/30/2013    Past Medical History:  Diagnosis Date  . Abdominal pain, periumbilic   . Allergic rhinitis   . Arthritis   . Asthma   . Automatic implantable cardioverter-defibrillator in situ 2006,  replaced 2008   MEDTRONIC  . Bronchitis   . Cancer ([facility])    Nodules in lungs pt believes were cancerous pt unsure  . DM (diabetes mellitus) ([facility]) 1997/1998   type 1, initially gestational but soon after child birth developed IDDM  . Esophageal stricture 2005/2009   esophageal strictures dilated 2005, 2009  . Eye hemorrhage    bilateral  . Fibroid, uterine   . Fibromyalgia   . GERD (gastroesophageal reflux disease) 2005  . History of blood transfusion years ago  . Internal hemorrhoids 2010   2011 band ligation of int rrhoids.  . Long Q-T syndrome   . Neuropathy in diabetes ([facility])   . Palpitation   . Sarcoidosis ([facility])   . Seizure disorder ([facility])    none recently  . Seizures ([facility])   . Stroke ([facility])    QUESTIONABLE TIA     Past Surgical History:  Procedure Laterality Date  . AUTOMATIC IMPLANTABLE CARDIAC DEFIBRILLATOR SITU  2006,    ICD-Medtronic  Remote - Yes   . CERVIX REMOVAL    . DILITATION & CURRETTAGE/HYSTROSCOPY WITH HYDROTHERMAL ABLATION N/A 06/07/2013   Procedure: DILATATION & CURETTAGE/HYSTEROSCOPY WITH attempted HYDROTHERMAL ABLATION;  Surgeon: Frederico Hamman, MD;  Location: [facility];  Service: Gynecology;  Laterality: N/A;  . EP IMPLANTABLE DEVICE N/A 04/04/2015   Procedure:  ICD Generator Changeout;  Surgeon: Evans Lance, MD;  Location: [facility];  Service: Cardiovascular;  Laterality: N/A;  . ESOPHAGOGASTRODUODENOSCOPY (EGD) WITH PROPOFOL N/A 10/24/2014   Procedure: ESOPHAGOGASTRODUODENOSCOPY (EGD) WITH PROPOFOL;  Surgeon: Inda Castle, MD;  Location: [facility];  Service: Endoscopy;  Laterality: N/A;  . EXPLORATORY LAPAROTOMY  11/06/14   evacuation of post TAH/BSO hematoma  . HAND SURGERY Right    x 2  . HEMORRHOID BANDING  03/2010  . SALPINGECTOMY    . SVT ablation    . TOTAL ABDOMINAL HYSTERECTOMY W/ BILATERAL SALPINGOOPHORECTOMY  11/06/14   at [facility]. for menorrhagia, pelvic pain and enlarging uterine fibroids  . TUBAL LIGATION    . VIDEO  BRONCHOSCOPY Bilateral 01/24/2015   Procedure: VIDEO BRONCHOSCOPY WITHOUT FLUORO;  Surgeon: Marshell Garfinkel, MD;  Location: [facility];  Service: Cardiopulmonary;  Laterality: Bilateral;    Family History  Problem Relation Age of Onset  . Heart attack Mother   . Lung cancer Maternal Uncle     x2  . Breast cancer Maternal Aunt     x2  . Stomach cancer Paternal Uncle   . Stomach cancer Paternal Grandfather   . Stomach cancer Paternal Grandmother   . Diabetes Father   . Positive PPD/TB Exposure Father   . Glaucoma Father   . Colon cancer Neg Hx     Social History   Social History  . Marital status: Single    Spouse name: N/A  . Number of children: N/A  . Years of education: N/A   Occupational History  . Not on file.   Social History Main Topics  . Smoking status: Never Smoker  . Smokeless tobacco: Never Used  . Alcohol use 0.0 oz/week     Comment: occ.  . Drug use: No  . Sexual activity: No   Other Topics Concern  . Not on file   Social History Narrative  . No narrative on file   Review of systems: Review of Systems  Constitutional: Negative for fever and chills.  HENT: Negative.   Eyes: Negative for blurred vision.  Respiratory: as per HPI  Cardiovascular: Negative for chest pain and palpitations.  Gastrointestinal: Negative for vomiting, diarrhea, blood per rectum. Genitourinary: Negative for dysuria, urgency, frequency and hematuria.  Musculoskeletal: Negative for myalgias, back pain and joint pain.  Skin: Negative for itching and rash.  Neurological: Negative for dizziness, tremors, focal weakness, seizures and loss of consciousness.  Endo/Heme/Allergies: Negative for environmental allergies.  Psychiatric/Behavioral: Negative for depression, suicidal ideas and hallucinations.  All other systems reviewed and are negative.  Physical Exam: Blood pressure 130/76, pulse 84, height _0  (1.676 m), weight 180 lb 6.4 oz (81.8 kg), last menstrual period  07/23/2014, SpO2 98 %. Gen:      No acute distress HEENT:  EOMI, sclera anicteric Neck:     No masses; no thyromegaly Lungs:    Clear to auscultation bilaterally; normal respiratory effort CV:         Regular rate and rhythm; no murmurs Abd:      + bowel sounds; soft, non-tender; no palpable masses, no distension Ext:    No edema; adequate  peripheral perfusion Skin:      Warm and dry; no rash Neuro: alert and oriented x 3 Psych: normal mood and affect  Data Reviewed: PFTs (08/29/14) FVC 2.98 (85%) FEV1 2.42 (85%) F/F 81  IMAGING: CT scan (01/21/15) 1. There is a spectrum of findings in the thorax that is most likely related to the patient's history of sarcoidosis. Specifically, there is a perilymphatic distribution of multiple tiny subcentimeter pulmonary nodules, in addition to mediastinal lymphadenopathy. 2. Dilatation of the pulmonic trunk (3.6 cm in diameter), suggestive of pulmonary arterial hypertension. 3. Splenomegaly. 4. Probable hepatomegaly.  CXR (03/03/15) Moderate left pleural effusion. Adjacent left lower lobe atelectasis versus infection. Possible lingular airspace disease as well. New enlargement of the cardiopericardial silhouette, which could relate to cardiomegaly and/or pericardial effusion.  CXR (06/13/15) Cardiomegaly, mild CHF, improved left effusion  All images reviewed.  LABS: Echo 01/28/15 EF 60-65%, PAP 36.  ACE level 01/22/15 - 177.   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  Hepatic Function Latest Ref Rng & Units 06/15/2016 01/23/2016 11/25/2015  Total Protein 6.0 - 8.3 g/dL 7.7 7.9 7.9  Albumin 3.5 - 5.2 g/dL 4.1 4.1 3.9  AST 0 - 37 U/L _0 ALT 0 - 35 U/L _1 Alk Phosphatase 39 - 117 U/L 120(H) 158(H) 207(H)  Total Bilirubin 0.2 - 1.2 mg/dL 0.6 0.8 0.9  Bilirubin, Direct 0.0 - 0.3  mg/dL 0.2 0.2 0.2    Assessment:  #1 Sarcoidosis, stage III. No record of granulomas on lung biopsy but liver biopsy does show granulomas. She is currently on a slow steroid taper with prednisone is at 10 mg per day. She presents with worsening respiratory symptoms. It's unclear if this is worsening of her sarcoid or a URI. I'll evaluate the getting a chest x-ray, flu test and give her an antibiotic course with Levaquin for 7 days. Close follow-up in 2 weeks. If there is no improvement then we may need to consider alternative treatments for sarcoid.  I have discussed her case with Dr. Havery Moros in past. Her LFTs have improved with steroids. We will continue to monitor. She continues on OTC vitamin D supplementation and PCP prophylaxis with Bactrim.  #2 Asthmatic bronchitis. Continue Qvar inhaler  #3 Cough She is already on Flonase nasal spray for rhinitis and Protonix for GERD.  #4 Chronic generalized body ache Continue naproxen as needed   Plan/Recommendations: - Continue prednisone at 10 mg for now. - Continue qvar - CXR - Levaquin    Marshell Garfinkel MD [facility] Pager [phone] 07/28/2016, 10:33 AM  CC: Laqueta Due MD

## 2016-07-28 NOTE — Patient Instructions (Addendum)
The flu test today is negative. We will get a chest x-ray and start you on Levaquin 750 mg a day for 7 days Return to clinic with me in 1-2 weeks

## 2016-07-30 ENCOUNTER — Ambulatory Visit
Admission: RE | Admit: 2016-07-30 | Discharge: 2016-07-30 | Disposition: A | Discharge status: Home / Self Care | Payer: Medicaid Other | Source: Ambulatory Visit | Attending: Neurology | Admitting: Neurology

## 2016-07-30 DIAGNOSIS — R269 Unspecified abnormalities of gait and mobility: Secondary | ICD-10-CM

## 2016-07-30 DIAGNOSIS — R29898 Other symptoms and signs involving the musculoskeletal system: Secondary | ICD-10-CM

## 2016-07-30 DIAGNOSIS — M542 Cervicalgia: Secondary | ICD-10-CM

## 2016-07-31 ENCOUNTER — Telehealth: Payer: Self-pay | Admitting: Pulmonary Disease

## 2016-07-31 NOTE — Telephone Encounter (Signed)
Pt requesting cxr results from 07/28/16. PM please advise. Thanks.

## 2016-07-31 NOTE — Telephone Encounter (Signed)
I have reviewed the X ray. There are changes of chronic sarcoid with possibly a new pneumonia. Ask her to finish the antibiotics. We will repeat CXR at time of return visit.  PM

## 2016-07-31 NOTE — Telephone Encounter (Signed)
Patient is returning phone call.  °

## 2016-07-31 NOTE — Telephone Encounter (Signed)
lmtcb x1 for pt. 

## 2016-08-03 ENCOUNTER — Telehealth: Payer: Self-pay | Admitting: *Deleted

## 2016-08-03 NOTE — Telephone Encounter (Signed)
-----   Message from Britt Bottom, MD sent at 08/03/2016  2:09 PM EDT ----- Please let him know that the CT scan of the cervical spine looked normal.

## 2016-08-03 NOTE — Telephone Encounter (Signed)
Spoke with pt, aware of results/recs.  Nothing further needed.  

## 2016-08-03 NOTE — Telephone Encounter (Signed)
LMOM for pt. that per RAS, CT of c/s appears normal.  She does not need to return this call unless she has questions/fim

## 2016-08-03 NOTE — Telephone Encounter (Signed)
[phone] pt calling back

## 2016-08-03 NOTE — Telephone Encounter (Signed)
lmomtcb x1 

## 2016-08-13 ENCOUNTER — Encounter: Payer: Self-pay | Admitting: Pulmonary Disease

## 2016-08-13 ENCOUNTER — Ambulatory Visit (INDEPENDENT_AMBULATORY_CARE_PROVIDER_SITE_OTHER): Payer: Medicaid Other | Admitting: Pulmonary Disease

## 2016-08-13 VITALS — BP 130/76 | HR 80 | Ht 66.0 in | Wt 176.0 lb

## 2016-08-13 DIAGNOSIS — R059 Cough, unspecified: Secondary | ICD-10-CM

## 2016-08-13 DIAGNOSIS — R05 Cough: Secondary | ICD-10-CM

## 2016-08-13 DIAGNOSIS — D869 Sarcoidosis, unspecified: Secondary | ICD-10-CM | POA: Diagnosis not present

## 2016-08-13 DIAGNOSIS — R0602 Shortness of breath: Secondary | ICD-10-CM

## 2016-08-13 NOTE — Patient Instructions (Signed)
We will reduce your prednisone dose to 7.5 mg daily  Return to clinic in 1-2 months. We will check some blood work and chest x-ray on her return visit.

## 2016-08-13 NOTE — Addendum Note (Signed)
Addended by: Maryanna Shape A on: 08/13/2016 02:09 PM   Modules accepted: Orders

## 2016-08-13 NOTE — Progress Notes (Signed)
[name]    [id number]    Jun 08, 1970  Primary Care Physician:Catherine Danley Danker, DO  Referring Physician: Nicolette Bang, DO [address] 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 Asthmatic bronchitis Chronic cough Ventricular tachycardia s/p AICD. Long QT syndrome Left pleural effusion April 30, 2015. Improved>> Thoracentesis >>exudative   HPI: [name] is 46 year old with stage III sarcoidosis, asthmatic bronchitis, chronic cough. She is a former patient of Dr. Joya Gaskins. She was diagnosed around 2003. There is record of a transbronchial biopsy in 2003 but no granulomas were seen. She had been on and off systemic prednisone. She was admitted in September 2016 with sarcoidosis flare. Bronchial with BAL showed 10K staph aureus which was thought to be a contaminant. She was given Solu-Medrol 60 q6 and then discharged on a slow prednisone taper starting at 60 mg. She is also being followed by cardiology for prolonged QT syndrome, family history of sudden death. Her ICD was changed in 2015/04/30.   She underwent a thoracentesis in November 16 which showed exudative left-sided predominant effusion, and cultures were negative. Repeat CXR showed improvement in her effusion. She she had a liver biopsy early 2017 that showed hepatic sarcoidosis with elevated LFTs. She's been on a prolonged steroid taper with good response and her last LFTs have normalized. We reduced her prednisone to 30 mg in July and then to instructed her to reduce the dose to 20 mg in April 29, 2016. But for some reason she has actually reduced the dose to 10 mg/day. 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 She is currently on lamotrigine and clonazepam.    Interim History: At last visit we evaluated her for  respiratory issues with an x-ray which showed possible pneumonia. She got a course of Levaquin with improvement in symptoms. She continues on the prednisone at 10 mg. She states that her breathing has improved but she still has some dyspnea on exertion, fatigue, cough. She does not report any fevers, chills, chest pain.                         Outpatient Encounter Prescriptions as of 08/13/2016  Medication Sig  . ACCU-CHEK SOFTCLIX LANCETS lancets Test Blood Sugar twice a day.  ICD-10 code E11.9.  Marland Kitchen acetaminophen (TYLENOL) 500 MG tablet Take 1,000 mg by mouth every 6 (six) hours as needed for mild pain or fever. Reported on 07/22/2015  . beclomethasone (QVAR) 80 MCG/ACT inhaler Inhale 2 puffs into the lungs 2 (two) times daily.  . Blood Glucose Monitoring Suppl (ACCU-CHEK AVIVA PLUS) w/Device KIT Test blood sugar once fasting and once ICD-10  Code  E11.9  . clonazePAM (KLONOPIN) 0.5 MG tablet Take 1 tablet (0.5 mg total) by mouth 2 (two) times daily as needed for anxiety.  . cycloSPORINE (RESTASIS) 0.05 % ophthalmic emulsion Place 1 drop into both eyes 2 (two) times daily.  . fluticasone (FLONASE) 50 MCG/ACT nasal spray Place 2 sprays into both nostrils daily as needed for allergies.   Marland Kitchen gabapentin (NEURONTIN) 300 MG capsule Take 1 capsule (300 mg total) by mouth 3 (three) times daily.  Marland Kitchen glucose blood (ACCU-CHEK AVIVA PLUS) test strip Test Blood Sugar twice a day. ICD-10 code E11.9  . Insulin Glargine (LANTUS SOLOSTAR) 100 UNIT/ML Solostar Pen Inject 40 Units  into the skin at bedtime.  Marland Kitchen lactulose (CHRONULAC) 10 GM/15ML solution Take 30 ml TID  . lamoTRIgine (LAMICTAL) 150 MG tablet Take 1 tablet (150 mg total) by mouth 2 (two) times daily.  Elmore Guise Devices (ACCU-CHEK SOFTCLIX) lancets Test Blood Sugar twice a day. ICD-10 code E11.9.  . levETIRAcetam (KEPPRA) 500 MG tablet Take 1 tablet (500 mg total) by mouth 2 (two) times daily.  . Linaclotide (LINZESS) 145 MCG CAPS capsule Take 1 capsule (145  mcg total) by mouth daily.  . meclizine (ANTIVERT) 25 MG tablet Take 0.5 tablets (12.5 mg total) by mouth 3 (three) times daily as needed for dizziness.  . naproxen (NAPROSYN) 375 MG tablet Take 1 tablet (375 mg total) by mouth 2 (two) times daily with a meal.  . NOVOLOG FLEXPEN 100 UNIT/ML FlexPen Inject 4-8 Units into the skin 3 (three) times daily.  . ondansetron (ZOFRAN) 4 MG tablet Take 1 tablet (4 mg total) by mouth every 6 (six) hours as needed for nausea or vomiting.  . pantoprazole (PROTONIX) 40 MG tablet Take 1 tablet (40 mg total) by mouth daily at 6 (six) AM.  . PAZEO 0.7 % SOLN instill 1 drop into both eyes once daily  . potassium chloride SA (K-DUR,KLOR-CON) 20 MEQ tablet Take 1 tablet (20 mEq total) by mouth daily.  . predniSONE (DELTASONE) 10 MG tablet Take 1 tablet (10 mg total) by mouth daily with breakfast.  . propranolol (INDERAL) 80 MG tablet Take 1 tablet (80 mg total) by mouth 2 (two) times daily.  Marland Kitchen sulfamethoxazole-trimethoprim (BACTRIM DS) 800-160 MG tablet Take 1 tablet by mouth 3 (three) times a week.  . [DISCONTINUED] levofloxacin (LEVAQUIN) 750 MG tablet Take 1 tablet (750 mg total) by mouth daily.   No facility-administered encounter medications on file as of 08/13/2016.     Allergies as of 08/13/2016 - Review Complete 08/13/2016  Allergen Reaction Noted  . Contrast media [iodinated diagnostic agents] Anaphylaxis and Shortness Of Breath 05/30/2013  . Augmentin [amoxicillin-pot clavulanate] Other (See Comments) 10/23/2014  . Codeine Nausea And Vomiting   . Demerol [meperidine]  07/22/2015  . Iohexol  05/28/2006  . Morphine and related Nausea And Vomiting 05/30/2013    Past Medical History:  Diagnosis Date  . Abdominal pain, periumbilic   . Allergic rhinitis   . Arthritis   . Asthma   . Automatic implantable cardioverter-defibrillator in situ 2006, replaced 2008   MEDTRONIC  . Bronchitis   . Cancer ([facility])    Nodules in lungs pt believes were cancerous pt  unsure  . DM (diabetes mellitus) ([facility]) 1997/1998   type 1, initially gestational but soon after child birth developed IDDM  . Esophageal stricture 2005/2009   esophageal strictures dilated 2005, 2009  . Eye hemorrhage    bilateral  . Fibroid, uterine   . Fibromyalgia   . GERD (gastroesophageal reflux disease) 2005  . History of blood transfusion years ago  . Internal hemorrhoids 2010   2011 band ligation of int rrhoids.  . Long Q-T syndrome   . Neuropathy in diabetes ([facility])   . Palpitation   . Sarcoidosis ([facility])   . Seizure disorder ([facility])    none recently  . Seizures ([facility])   . Stroke ([facility])    QUESTIONABLE TIA     Past Surgical History:  Procedure Laterality Date  . AUTOMATIC IMPLANTABLE CARDIAC DEFIBRILLATOR SITU  2006,    ICD-Medtronic   Remote - Yes   . CERVIX REMOVAL    . DILITATION &  CURRETTAGE/HYSTROSCOPY WITH HYDROTHERMAL ABLATION N/A 06/07/2013   Procedure: DILATATION & CURETTAGE/HYSTEROSCOPY WITH attempted HYDROTHERMAL ABLATION;  Surgeon: Frederico Hamman, MD;  Location: [facility];  Service: Gynecology;  Laterality: N/A;  . EP IMPLANTABLE DEVICE N/A 04/04/2015   Procedure:  ICD Generator Changeout;  Surgeon: Evans Lance, MD;  Location: [facility];  Service: Cardiovascular;  Laterality: N/A;  . ESOPHAGOGASTRODUODENOSCOPY (EGD) WITH PROPOFOL N/A 10/24/2014   Procedure: ESOPHAGOGASTRODUODENOSCOPY (EGD) WITH PROPOFOL;  Surgeon: Inda Castle, MD;  Location: [facility];  Service: Endoscopy;  Laterality: N/A;  . EXPLORATORY LAPAROTOMY  11/06/14   evacuation of post TAH/BSO hematoma  . HAND SURGERY Right    x 2  . HEMORRHOID BANDING  03/2010  . SALPINGECTOMY    . SVT ablation    . TOTAL ABDOMINAL HYSTERECTOMY W/ BILATERAL SALPINGOOPHORECTOMY  11/06/14   at [facility]. for menorrhagia, pelvic pain and enlarging uterine fibroids  . TUBAL LIGATION    . VIDEO BRONCHOSCOPY Bilateral 01/24/2015   Procedure: VIDEO BRONCHOSCOPY WITHOUT FLUORO;  Surgeon: Marshell Garfinkel, MD;   Location: [facility];  Service: Cardiopulmonary;  Laterality: Bilateral;    Family History  Problem Relation Age of Onset  . Heart attack Mother   . Lung cancer Maternal Uncle     x2  . Breast cancer Maternal Aunt     x2  . Stomach cancer Paternal Uncle   . Stomach cancer Paternal Grandfather   . Stomach cancer Paternal Grandmother   . Diabetes Father   . Positive PPD/TB Exposure Father   . Glaucoma Father   . Colon cancer Neg Hx     Social History   Social History  . Marital status: Single    Spouse name: N/A  . Number of children: N/A  . Years of education: N/A   Occupational History  . Not on file.   Social History Main Topics  . Smoking status: Never Smoker  . Smokeless tobacco: Never Used  . Alcohol use 0.0 oz/week     Comment: occ.  . Drug use: No  . Sexual activity: No   Other Topics Concern  . Not on file   Social History Narrative  . No narrative on file   Review of systems: Review of Systems  Constitutional: Negative for fever and chills.  HENT: Negative.   Eyes: Negative for blurred vision.  Respiratory: as per HPI  Cardiovascular: Negative for chest pain and palpitations.  Gastrointestinal: Negative for vomiting, diarrhea, blood per rectum. Genitourinary: Negative for dysuria, urgency, frequency and hematuria.  Musculoskeletal: Negative for myalgias, back pain and joint pain.  Skin: Negative for itching and rash.  Neurological: Negative for dizziness, tremors, focal weakness, seizures and loss of consciousness.  Endo/Heme/Allergies: Negative for environmental allergies.  Psychiatric/Behavioral: Negative for depression, suicidal ideas and hallucinations.  All other systems reviewed and are negative.  Physical Exam: Blood pressure 130/76, pulse 84, height 5' 6"  (1.676 m), weight 180 lb 6.4 oz (81.8 kg), last menstrual period 07/23/2014, SpO2 98 %. Gen:      No acute distress HEENT:  EOMI, sclera anicteric Neck:     No masses; no  thyromegaly Lungs:    Clear to auscultation bilaterally; normal respiratory effort CV:         Regular rate and rhythm; no murmurs Abd:      + bowel sounds; soft, non-tender; no palpable masses, no distension Ext:    No edema; adequate peripheral perfusion Skin:      Warm and dry; no rash Neuro:  alert and oriented x 3 Psych: normal mood and affect  Data Reviewed: PFTs (08/29/14) FVC 2.98 (85%) FEV1 2.42 (85%) F/F 81  IMAGING: CT scan (01/21/15) 1. There is a spectrum of findings in the thorax that is most likely related to the patient's history of sarcoidosis. Specifically, there is a perilymphatic distribution of multiple tiny subcentimeter pulmonary nodules, in addition to mediastinal lymphadenopathy. 2. Dilatation of the pulmonic trunk (3.6 cm in diameter), suggestive of pulmonary arterial hypertension. 3. Splenomegaly. 4. Probable hepatomegaly.  CXR (03/03/15) Moderate left pleural effusion. Adjacent left lower lobe atelectasis versus infection. Possible lingular airspace disease as well. New enlargement of the cardiopericardial silhouette, which could relate to cardiomegaly and/or pericardial effusion.  CXR (06/13/15) Cardiomegaly, mild CHF, improved left effusion   CXR (07/28/16) Mild infrahilar opacity on chronic fibrotic changes suggestive of pneumonia  I have reviewed all images personally.  LABS: Echo 01/28/15 EF 60-65%, PAP 36.  ACE level 01/22/15 - 177.   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  Hepatic Function Latest Ref Rng & Units 06/15/2016 01/23/2016 11/25/2015  Total Protein 6.0 - 8.3 g/dL 7.7 7.9 7.9  Albumin 3.5 - 5.2 g/dL 4.1 4.1 3.9  AST 0 - 37 U/L 17 21 23   ALT 0 - 35 U/L 13 25 24   Alk Phosphatase 39 - 117 U/L 120(H) 158(H) 207(H)  Total Bilirubin 0.2 - 1.2 mg/dL 0.6 0.8 0.9  Bilirubin, Direct 0.0 -  0.3 mg/dL 0.2 0.2 0.2    Assessment:  #1 Sarcoidosis, stage III. No record of granulomas on lung biopsy but liver biopsy does show granulomas. She is currently on a slow steroid taper with prednisone is at 10 mg per day. Treated with Levaquin at last visit and appears to have improved We reduce prednisone to 7.5 mg. She'll return in 1-2 months with a follow-up chest x-ray and labs She continues on OTC vitamin D supplementation and PCP prophylaxis with Bactrim.  #2 Asthmatic bronchitis. Continue Qvar inhaler  #3 Cough Continue Flonase for rhinitis and Protonix for GERD  Plan/Recommendations: - Reduce prednisone to 7.5 mg - Continue Qvar inhaler     Marshell Garfinkel MD [facility] Pager [phone] 08/13/2016, 1:46 PM  CC: Laqueta Due MD

## 2016-08-18 ENCOUNTER — Ambulatory Visit: Payer: Medicaid Other | Admitting: Podiatry

## 2016-08-26 ENCOUNTER — Telehealth: Payer: Self-pay | Admitting: Internal Medicine

## 2016-08-26 NOTE — Telephone Encounter (Signed)
Left a message noting her A1c of 11 and request a call back to reschedule. - [name]

## 2016-09-21 ENCOUNTER — Ambulatory Visit (INDEPENDENT_AMBULATORY_CARE_PROVIDER_SITE_OTHER): Payer: Medicaid Other | Admitting: *Deleted

## 2016-09-21 DIAGNOSIS — I4581 Long QT syndrome: Secondary | ICD-10-CM

## 2016-09-22 ENCOUNTER — Encounter: Payer: Self-pay | Admitting: Internal Medicine

## 2016-09-22 ENCOUNTER — Ambulatory Visit (INDEPENDENT_AMBULATORY_CARE_PROVIDER_SITE_OTHER): Payer: Medicaid Other | Admitting: Internal Medicine

## 2016-09-22 ENCOUNTER — Telehealth: Payer: Self-pay | Admitting: Neurology

## 2016-09-22 VITALS — BP 142/78 | HR 88 | Temp 98.3°F | Ht 66.0 in | Wt 176.4 lb

## 2016-09-22 DIAGNOSIS — D860 Sarcoidosis of lung: Secondary | ICD-10-CM | POA: Diagnosis not present

## 2016-09-22 DIAGNOSIS — R51 Headache: Secondary | ICD-10-CM | POA: Diagnosis present

## 2016-09-22 DIAGNOSIS — E113293 Type 2 diabetes mellitus with mild nonproliferative diabetic retinopathy without macular edema, bilateral: Secondary | ICD-10-CM | POA: Diagnosis not present

## 2016-09-22 DIAGNOSIS — R519 Headache, unspecified: Secondary | ICD-10-CM

## 2016-09-22 LAB — CUP PACEART REMOTE DEVICE CHECK
Brady Statistic RV Percent Paced: 0 %
Date Time Interrogation Session: 20180507083824
HIGH POWER IMPEDANCE MEASURED VALUE: 51 Ohm
HIGH POWER IMPEDANCE MEASURED VALUE: 77 Ohm
Implantable Lead Implant Date: 20080828
Implantable Lead Model: 6947
Lead Channel Impedance Value: 342 Ohm
Lead Channel Pacing Threshold Amplitude: 1.625 V
Lead Channel Sensing Intrinsic Amplitude: 5.125 mV
Lead Channel Setting Pacing Pulse Width: 0.4 ms
MDC IDC LEAD LOCATION: 753860
MDC IDC MSMT BATTERY REMAINING LONGEVITY: 128 mo
MDC IDC MSMT BATTERY VOLTAGE: 3.04 V
MDC IDC MSMT LEADCHNL RV IMPEDANCE VALUE: 437 Ohm
MDC IDC MSMT LEADCHNL RV PACING THRESHOLD PULSEWIDTH: 0.4 ms
MDC IDC MSMT LEADCHNL RV SENSING INTR AMPL: 5.125 mV
MDC IDC PG IMPLANT DT: 20161117
MDC IDC SET LEADCHNL RV PACING AMPLITUDE: 3.25 V
MDC IDC SET LEADCHNL RV SENSING SENSITIVITY: 0.3 mV

## 2016-09-22 MED ORDER — METHYLPREDNISOLONE ACETATE 40 MG/ML IJ SUSP
40.0000 mg | Freq: Once | INTRAMUSCULAR | Status: AC
  Administered 2016-09-22: 40 mg via INTRAMUSCULAR

## 2016-09-22 MED ORDER — KETOROLAC TROMETHAMINE 60 MG/2ML IM SOLN
60.0000 mg | Freq: Once | INTRAMUSCULAR | Status: AC
  Administered 2016-09-22: 60 mg via INTRAMUSCULAR

## 2016-09-22 NOTE — Progress Notes (Signed)
Remote ICD transmission.   

## 2016-09-22 NOTE — Telephone Encounter (Signed)
Patient called office requesting to be seen today with Dr. Felecia Shelling due to bad headache going on for 2 weeks.  Patient states she is taking tylenol 500mg  and its not going anything at all.  Please call

## 2016-09-22 NOTE — Progress Notes (Signed)
Subjective:    [name] - 46 y.o. female MRN [id number]  Date of birth: 1971/01/09  HPI  [name] is here for SDA for headache.  HEADACHE  Headache started >7 days ago. Has history of migraines but this pain feels very different.  Pain is interfering with daily activities.  Severity:  Very severe.  Location: Right temple extending across side of right head.  Medications tried: Tylenol  Head trauma: No Sudden onset: No. Started gradually and has been getting worse.  Previous similar headaches: No Taking blood thinners: No  History of cancer: No   Symptoms Nose congestion stuffiness:  Yes. Has allergies. Using Flonase and Benadryl.  Nausea vomiting: Yes. Emesis x1.  Photophobia: No  Noise sensitivity: no Double vision or loss of vision: Dark spots in right eyes, vision is blurry  Fever: No  Neck Stiffness: No  Trouble walking or speaking: no   -  reports that she has never smoked. She has never used smokeless tobacco. - Review of Systems: Per HPI. - Past Medical History: Patient Active Problem List   Diagnosis Date Noted  . Neck pain 07/20/2016  . Lower extremity weakness 06/17/2016  . Juvenile myoclonic epilepsy ([place]) 03/23/2016  . Occipital neuralgia of left side 03/05/2016  . Other headache syndrome 03/05/2016  . Gait disturbance 02/12/2015  . 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 Chronic fatigue 01/01/2015  . Nausea without vomiting 12/11/2014  . Dyspnea 12/10/2014  . Weight loss   . Long QT interval 11/06/2014  . PSVT 02/24/2010  . PRECORDIAL PAIN 08/22/2009  . Palpitations 10/05/2008  . Automatic implantable cardioverter-defibrillator in situ 10/05/2008  . Absence seizure disorder ([facility]) 09/26/2008  . Constipation 02/08/2008  . Sarcoidosis, stage 3 03/15/2007  . Diabetes mellitus, type 2 ([place]) 03/15/2007  . Essential hypertension 03/15/2007  . SYNDROME, LONG QT 03/15/2007  . ALLERGIC RHINITIS 03/15/2007    . OBSTRUCTIVE CHRONIC BRONCHITIS 03/15/2007  . ESOPHAGITIS 02/05/2004  . ESOPHAGEAL STRICTURE 02/05/2004   - Medications: reviewed and updated   Objective:   Physical Exam BP (!) 142/78   Pulse 88   Temp 98.3 F (36.8 C) (Oral)   Ht 5\' 6"  (1.676 m)   Wt 176 lb 6.4 oz (80 kg)   LMP 07/23/2014   SpO2 95%   BMI 28.47 kg/m  Gen: NAD, appears to be in pain but non-toxic  HEENT: NCAT, PERRL, clear conjunctiva, oropharynx clear, supple neck, significant pain to palpation over right temple, no TTP over left temple, no maxillary or frontal sinus tenderness, full ROM at neck  CV: RRR, good S1/S2, no murmur Resp: CTABL, no wheezes, non-labored Neuro: CN II-XII grossly intact. Strength equal and intact in all extremities. Speech normal.  Psych: good insight, alert and oriented    Assessment & Plan:   1. Intractable headache, unspecified chronicity pattern, unspecified headache type Although patient has history of migraines, she reports that this is not how her classic migraine typically presents. Recent negative cervical spine CT without findings of degenerative changes or impingement, so doubt cervical process related to headache. No neuro findings on exam or focal changes on history that would be concerning for CVA. Given finding of significant TTP over right temporal artery and report of right sided vision changes, am concerned for GCA.  -scheduled a visit at [facility] for today, 5/8 at 13:00 for further evaluation and management  - ketorolac (TORADOL) injection 60 mg; Inject 2 mLs (60 mg total)  into the muscle once. - methylPREDNISolone acetate (DEPO-MEDROL) injection 40 mg; Inject 1 mL (40 mg total) into the muscle once.    Phill Myron, D.O. 09/22/2016, 10:39 AM PGY-2, [place]

## 2016-09-22 NOTE — Telephone Encounter (Signed)
I have spoken with [name] this morning.  She sts. she has had increased h/a's over the last 2 wks.  Saw her pcp this morning and he gave steroid inj. and some pain inj.  Sts h/a is now easing up.  Denies sz. activity.  She will call back if h/a's worsen again/fim

## 2016-09-22 NOTE — Patient Instructions (Addendum)
Please go to the eye appointment scheduled for today 1:00 pm. I am concerned that you may have Temporal Arteritis. We have given you a pain shot and a steroid shot at your visit today. Please ask the eye doctor to send your records from your visit today back to me at the [facility].  [facility]  [address], [place], [place] [address] [phone]  Take Care,   Dr. Juleen China

## 2016-09-25 ENCOUNTER — Other Ambulatory Visit: Payer: Self-pay | Admitting: Physician Assistant

## 2016-09-25 ENCOUNTER — Encounter: Payer: Self-pay | Admitting: Cardiology

## 2016-10-06 ENCOUNTER — Ambulatory Visit (INDEPENDENT_AMBULATORY_CARE_PROVIDER_SITE_OTHER): Payer: Medicaid Other | Admitting: Pulmonary Disease

## 2016-10-06 ENCOUNTER — Encounter: Payer: Self-pay | Admitting: Pulmonary Disease

## 2016-10-06 VITALS — BP 132/76 | HR 87 | Ht 66.0 in | Wt 179.4 lb

## 2016-10-06 DIAGNOSIS — D862 Sarcoidosis of lung with sarcoidosis of lymph nodes: Secondary | ICD-10-CM | POA: Diagnosis not present

## 2016-10-06 NOTE — Progress Notes (Signed)
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 Asthmatic bronchitis Chronic cough Ventricular tachycardia s/p AICD. Long QT syndrome Left pleural effusion 05-12-2015. Improved>> Thoracentesis >>exudative   HPI: [name] is 46 year old with stage III sarcoidosis, asthmatic bronchitis, chronic cough. She is a former patient of Dr. Joya Gaskins. She was diagnosed around 2003. There is record of a transbronchial biopsy in 2003 but no granulomas were seen. She had been on and off systemic prednisone. She was admitted in September 2016 with sarcoidosis flare. Bronchial with BAL showed 10K staph aureus which was thought to be a contaminant. She was given Solu-Medrol 60 q6 and then discharged on a slow prednisone taper starting at 60 mg. She is also being followed by cardiology for prolonged QT syndrome, family history of sudden death. Her ICD was changed in 05/12/15.   She underwent a thoracentesis in November 16 which showed exudative left-sided predominant effusion, and cultures were negative. Repeat CXR showed improvement in her effusion. She she had a liver biopsy early 2017 that showed hepatic sarcoidosis with elevated LFTs. She's been on a prolonged steroid taper with good response and her last LFTs have normalized. We reduced her prednisone to 30 mg in July and then to instructed her to reduce the dose to 20 mg in 05/11/2016. But for some reason she has actually reduced the dose to 10 mg/day. 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 She is currently on lamotrigine and clonazepam.    Interim History: Prednisone reduced to 7.5 mg daily at  last visit. She is tolerating this well. She reports chronic fatigue, dyspnea which is unchanged from before.                         Outpatient Encounter Prescriptions as of 10/06/2016  Medication Sig  . ACCU-CHEK SOFTCLIX LANCETS lancets Test Blood Sugar twice a day.  ICD-10 code E11.9.  Marland Kitchen acetaminophen (TYLENOL) 500 MG tablet Take 1,000 mg by mouth every 6 (six) hours as needed for mild pain or fever. Reported on 07/22/2015  . beclomethasone (QVAR) 80 MCG/ACT inhaler Inhale 2 puffs into the lungs 2 (two) times daily.  . Blood Glucose Monitoring Suppl (ACCU-CHEK AVIVA PLUS) w/Device KIT Test blood sugar once fasting and once ICD-10  Code  E11.9  . clonazePAM (KLONOPIN) 0.5 MG tablet Take 1 tablet (0.5 mg total) by mouth 2 (two) times daily as needed for anxiety.  . cycloSPORINE (RESTASIS) 0.05 % ophthalmic emulsion Place 1 drop into both eyes 2 (two) times daily.  . fluticasone (FLONASE) 50 MCG/ACT nasal spray Place 2 sprays into both nostrils daily as needed for allergies.   Marland Kitchen gabapentin (NEURONTIN) 300 MG capsule Take 1 capsule (300 mg total) by mouth 3 (three) times daily.  Marland Kitchen glucose blood (ACCU-CHEK AVIVA PLUS) test strip Test Blood Sugar twice a day. ICD-10 code E11.9  . Insulin Glargine (LANTUS SOLOSTAR) 100 UNIT/ML Solostar Pen Inject 40 Units into the skin at bedtime.  Marland Kitchen lactulose (CHRONULAC) 10 GM/15ML solution Take 30 ml TID  . lamoTRIgine (LAMICTAL) 150 MG tablet Take 1 tablet (150 mg total) by mouth 2 (two) times daily.  Marland Kitchen  Lancet Devices (ACCU-CHEK SOFTCLIX) lancets Test Blood Sugar twice a day. ICD-10 code E11.9.  . levETIRAcetam (KEPPRA) 500 MG tablet Take 1 tablet (500 mg total) by mouth 2 (two) times daily.  . Linaclotide (LINZESS) 145 MCG CAPS capsule Take 1 capsule (145 mcg total) by mouth daily.  . meclizine (ANTIVERT) 25 MG tablet Take 0.5 tablets (12.5 mg total) by mouth 3 (three) times daily as needed for dizziness.  . naproxen (NAPROSYN) 375 MG tablet Take 1 tablet (375 mg  total) by mouth 2 (two) times daily with a meal.  . NOVOLOG FLEXPEN 100 UNIT/ML FlexPen Inject 4-8 Units into the skin 3 (three) times daily.  . pantoprazole (PROTONIX) 40 MG tablet Take 1 tablet (40 mg total) by mouth daily at 6 (six) AM.  . PAZEO 0.7 % SOLN instill 1 drop into both eyes once daily  . potassium chloride SA (K-DUR,KLOR-CON) 20 MEQ tablet take 1 tablet by mouth once daily  . predniSONE (DELTASONE) 5 MG tablet Take by mouth daily with breakfast. 7.60m daily  . propranolol (INDERAL) 80 MG tablet Take 1 tablet (80 mg total) by mouth 2 (two) times daily.  .Marland Kitchensulfamethoxazole-trimethoprim (BACTRIM DS) 800-160 MG tablet Take 1 tablet by mouth 3 (three) times a week.  . [DISCONTINUED] ondansetron (ZOFRAN) 4 MG tablet Take 1 tablet (4 mg total) by mouth every 6 (six) hours as needed for nausea or vomiting.  . [DISCONTINUED] predniSONE (DELTASONE) 10 MG tablet Take 1 tablet (10 mg total) by mouth daily with breakfast.   No facility-administered encounter medications on file as of 10/06/2016.     Allergies as of 10/06/2016 - Review Complete 10/06/2016  Allergen Reaction Noted  . Contrast media [iodinated diagnostic agents] Anaphylaxis and Shortness Of Breath 05/30/2013  . Augmentin [amoxicillin-pot clavulanate] Other (See Comments) 10/23/2014  . Codeine Nausea And Vomiting   . Demerol [meperidine]  07/22/2015  . Iohexol  05/28/2006  . Morphine and related Nausea And Vomiting 05/30/2013    Past Medical History:  Diagnosis Date  . Abdominal pain, periumbilic   . Allergic rhinitis   . Arthritis   . Asthma   . Automatic implantable cardioverter-defibrillator in situ 2006, replaced 2008   [employer]  . Bronchitis   . Cancer ([facility]    Nodules in lungs pt believes were cancerous pt unsure  . DM (diabetes mellitus) ([facility] 1997/1998   type 1, initially gestational but soon after child birth developed IDDM  . Esophageal stricture 2005/2009   esophageal strictures dilated 2005, 2009  .  Eye hemorrhage    bilateral  . Fibroid, uterine   . Fibromyalgia   . GERD (gastroesophageal reflux disease) 2005  . History of blood transfusion years ago  . Internal hemorrhoids 2010   2011 band ligation of int rrhoids.  . Long Q-T syndrome   . Neuropathy in diabetes ([facility]   . Palpitation   . Sarcoidosis   . Seizure disorder ([facility] [place]    none recently  . Seizures ([facility]   . Stroke ([facility]    QUESTIONABLE TIA     Past Surgical History:  Procedure Laterality Date  . AUTOMATIC IMPLANTABLE CARDIAC DEFIBRILLATOR SITU  2006,    ICD-Medtronic   Remote - Yes   . CERVIX REMOVAL    . DILITATION & CURRETTAGE/HYSTROSCOPY WITH HYDROTHERMAL ABLATION N/A 06/07/2013   Procedure: DILATATION & CURETTAGE/HYSTEROSCOPY WITH attempted HYDROTHERMAL ABLATION;  Surgeon: BFrederico Hamman MD;  [facility];  Service: Gynecology;  Laterality: N/A;  . EP IMPLANTABLE DEVICE N/A 04/04/2015  Procedure:  ICD Generator Changeout;  Surgeon: Evans Lance, MD;  Location: [facility];  Service: Cardiovascular;  Laterality: N/A;  . ESOPHAGOGASTRODUODENOSCOPY (EGD) WITH PROPOFOL N/A 10/24/2014   Procedure: ESOPHAGOGASTRODUODENOSCOPY (EGD) WITH PROPOFOL;  Surgeon: Inda Castle, MD;  Location: [facility];  Service: Endoscopy;  Laterality: N/A;  . EXPLORATORY LAPAROTOMY  11/06/14   evacuation of post TAH/BSO hematoma  . HAND SURGERY Right    x 2  . HEMORRHOID BANDING  03/2010  . SALPINGECTOMY    . SVT ablation    . TOTAL ABDOMINAL HYSTERECTOMY W/ BILATERAL SALPINGOOPHORECTOMY  11/06/14   at [facility]. for menorrhagia, pelvic pain and enlarging uterine fibroids  . TUBAL LIGATION    . VIDEO BRONCHOSCOPY Bilateral 01/24/2015   Procedure: VIDEO BRONCHOSCOPY WITHOUT FLUORO;  Surgeon: Marshell Garfinkel, MD;  Location: [facility];  Service: Cardiopulmonary;  Laterality: Bilateral;    Family History  Problem Relation Age of Onset  . Heart attack Mother   . Lung cancer Maternal Uncle        x2  . Breast cancer  Maternal Aunt        x2  . Stomach cancer Paternal Uncle   . Stomach cancer Paternal Grandfather   . Stomach cancer Paternal Grandmother   . Diabetes Father   . Positive PPD/TB Exposure Father   . Glaucoma Father   . Colon cancer Neg Hx     Social History   Social History  . Marital status: Single    Spouse name: N/A  . Number of children: N/A  . Years of education: N/A   Occupational History  . Not on file.   Social History Main Topics  . Smoking status: Never Smoker  . Smokeless tobacco: Never Used  . Alcohol use 0.0 oz/week     Comment: occ.  . Drug use: No  . Sexual activity: No   Other Topics Concern  . Not on file   Social History Narrative  . No narrative on file   Review of systems: Review of Systems  Constitutional: Negative for fever and chills.  HENT: Negative.   Eyes: Negative for blurred vision.  Respiratory: as per HPI  Cardiovascular: Negative for chest pain and palpitations.  Gastrointestinal: Negative for vomiting, diarrhea, blood per rectum. Genitourinary: Negative for dysuria, urgency, frequency and hematuria.  Musculoskeletal: Negative for myalgias, back pain and joint pain.  Skin: Negative for itching and rash.  Neurological: Negative for dizziness, tremors, focal weakness, seizures and loss of consciousness.  Endo/Heme/Allergies: Negative for environmental allergies.  Psychiatric/Behavioral: Negative for depression, suicidal ideas and hallucinations.  All other systems reviewed and are negative.  Physical Exam: Blood pressure 132/76, pulse 87, height 5' 6"  (1.676 m), weight 179 lb 6.4 oz (81.4 kg), last menstrual period 07/23/2014, SpO2 97 %. Gen:      No acute distress HEENT:  EOMI, sclera anicteric Neck:     No masses; no thyromegaly Lungs:    Clear to auscultation bilaterally; normal respiratory effort CV:         Regular rate and rhythm; no murmurs Abd:      + bowel sounds; soft, non-tender; no palpable masses, no distension Ext:     No edema; adequate peripheral perfusion Skin:      Warm and dry; no rash Neuro: alert and oriented x 3 Psych: normal mood and affect  Data Reviewed: PFTs (08/29/14) FVC 2.98 (85%) FEV1 2.42 (85%) F/F 81  IMAGING: CT scan (01/21/15) 1. There is a spectrum of findings  in the thorax that is most likely related to the patient's history of sarcoidosis. Specifically, there is a perilymphatic distribution of multiple tiny subcentimeter pulmonary nodules, in addition to mediastinal lymphadenopathy. 2. Dilatation of the pulmonic trunk (3.6 cm in diameter), suggestive of pulmonary arterial hypertension. 3. Splenomegaly. 4. Probable hepatomegaly.  CXR (03/03/15) Moderate left pleural effusion. Adjacent left lower lobe atelectasis versus infection. Possible lingular airspace disease as well. New enlargement of the cardiopericardial silhouette, which could relate to cardiomegaly and/or pericardial effusion.  CXR (06/13/15) Cardiomegaly, mild CHF, improved left effusion   CXR (07/28/16) Mild infrahilar opacity on chronic fibrotic changes suggestive of pneumonia  I have reviewed all images personally.  LABS: Echo 01/28/15 EF 60-65%, PAP 36.  ACE level 01/22/15 - 177.   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  Hepatic Function Latest Ref Rng & Units 06/15/2016 01/23/2016 11/25/2015  Total Protein 6.0 - 8.3 g/dL 7.7 7.9 7.9  Albumin 3.5 - 5.2 g/dL 4.1 4.1 3.9  AST 0 - 37 U/L 17 21 23   ALT 0 - 35 U/L 13 25 24   Alk Phosphatase 39 - 117 U/L 120(H) 158(H) 207(H)  Total Bilirubin 0.2 - 1.2 mg/dL 0.6 0.8 0.9  Bilirubin, Direct 0.0 - 0.3 mg/dL 0.2 0.2 0.2    Assessment:  #1 Sarcoidosis, stage III. No record of granulomas on lung biopsy but liver biopsy does show granulomas. She is currently on a slow steroid taper with prednisone is at 7.5 mg  per day. We will reduce prednisone to 7.5 mg. She'll return in 2 months with a follow-up chest x-ray and labs She continues on OTC vitamin D supplementation and PCP prophylaxis with Bactrim.  #2 Asthmatic bronchitis. Continue Qvar inhaler  #3 Cough Continue Flonase for rhinitis and Protonix for GERD  Plan/Recommendations: - Reduce prednisone to 7.5 mg - Continue Qvar inhaler     Marshell Garfinkel MD [facility] Pager [phone] 10/06/2016, 3:16 PM  CC: Laqueta Due MD

## 2016-10-06 NOTE — Patient Instructions (Signed)
Reduce the prednisone 5 mg daily  Return to clinic in 3 months. Please call us sooner if there is any change in your symptoms.

## 2016-10-13 IMAGING — CR DG CHEST 2V
2 series · 2 of 2 positions shown · non-contrast
Comparison: 09/03/2011

CLINICAL DATA: Chest tightness for 1 week.  Cough and congestion.

EXAM:
CHEST  2 VIEW

[view not recorded (1 of 2)]
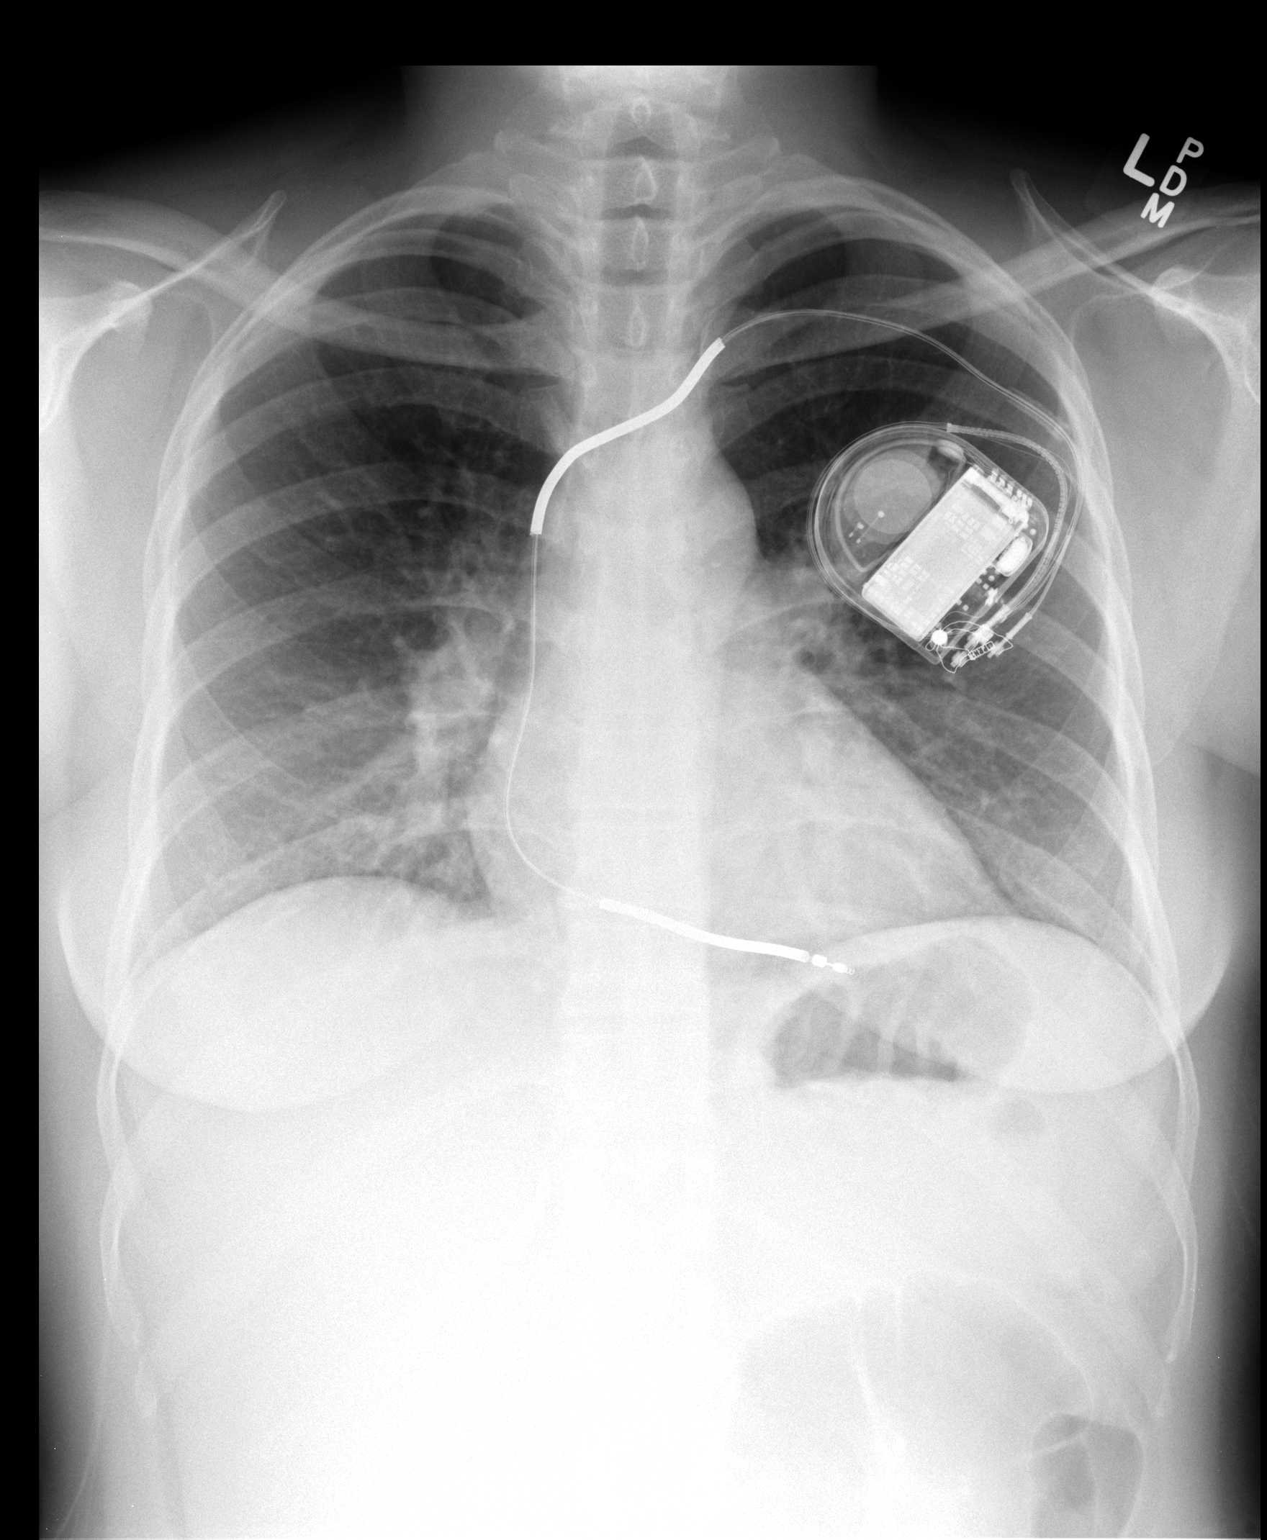

[view not recorded (2 of 2)]
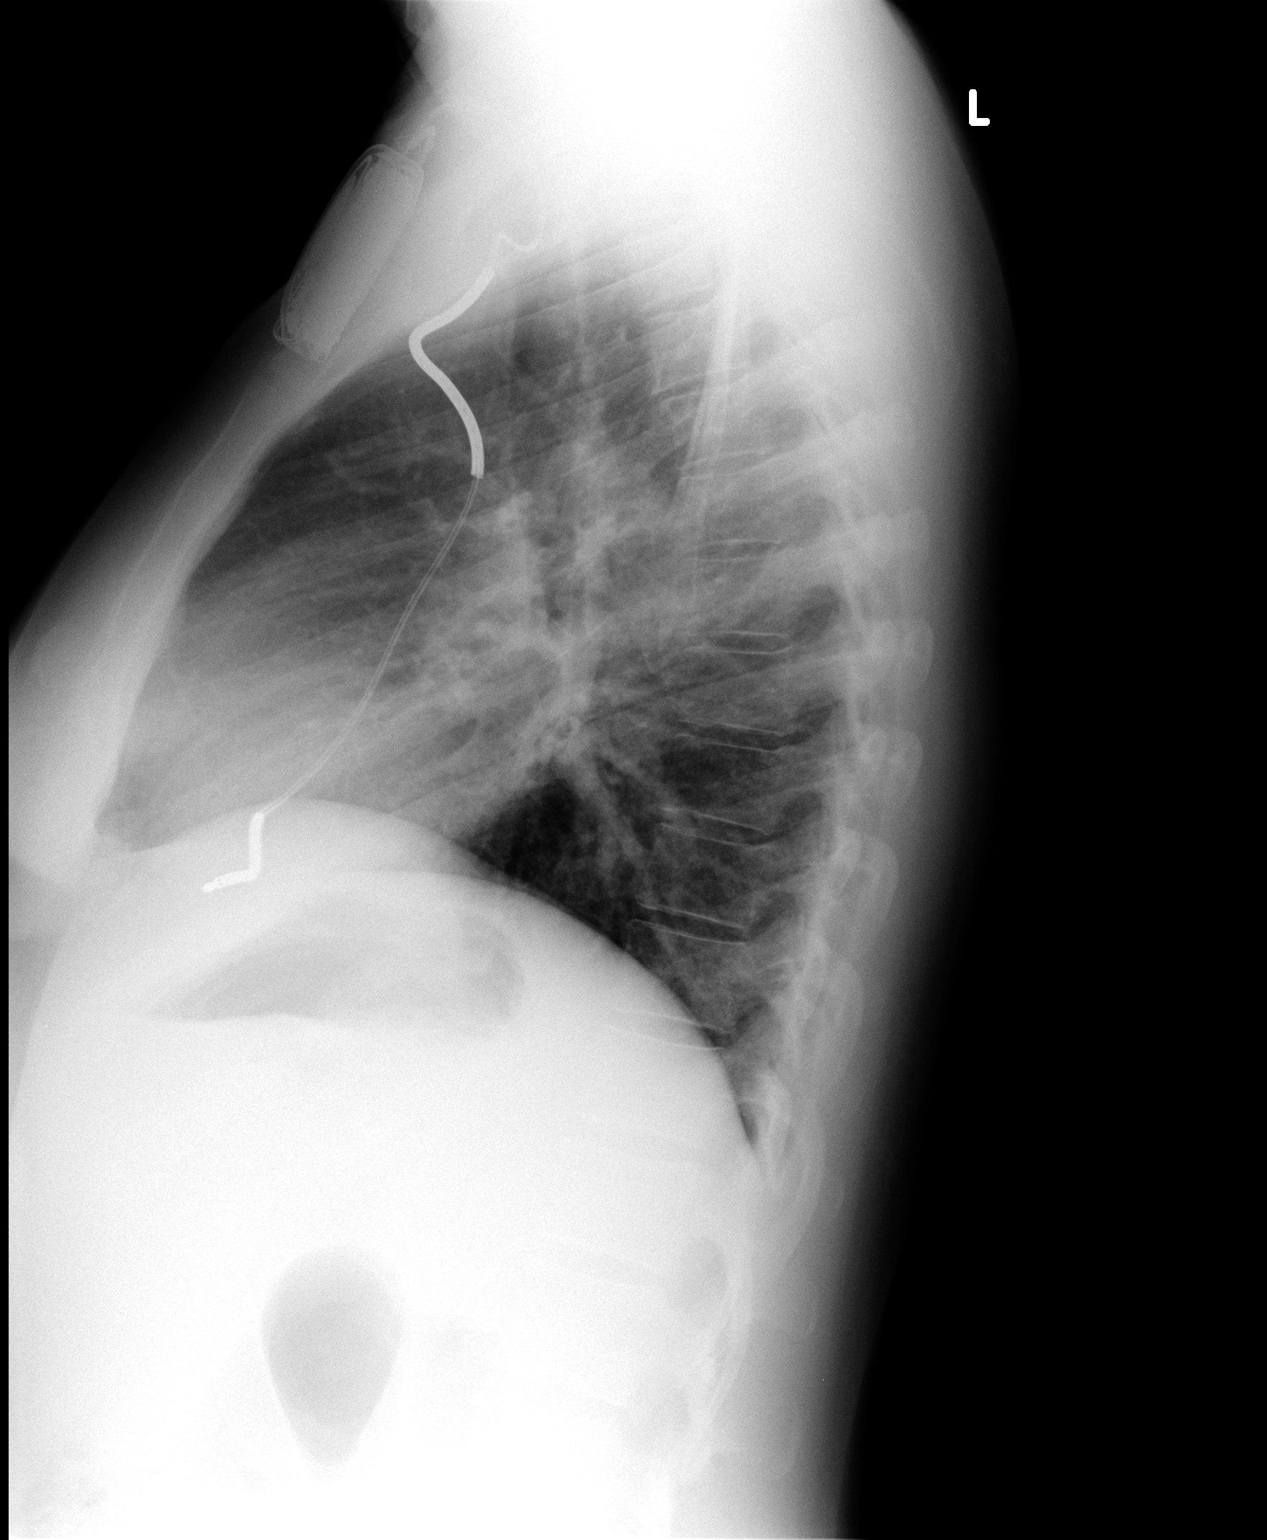

[2 of 2 positions shown; findings below may reference images not displayed]

FINDINGS: Stable position of the left cardiac ICD. Heart size is normal. Both
lungs are clear without airspace disease or edema. The trachea is
midline. Negative for pleural effusions. Bony structures are
unremarkable.
IMPRESSION: No active cardiopulmonary disease.

## 2016-10-14 ENCOUNTER — Telehealth: Payer: Self-pay | Admitting: Internal Medicine

## 2016-10-14 NOTE — Telephone Encounter (Signed)
Did not answer. Left message with last A1c and request she call back - Mesha Guinyard

## 2016-10-15 NOTE — Telephone Encounter (Signed)
Pt says her glucometer has been recalled.  She needs a new monitor and strips.  Medicaid will not pay for the strips since the meter has been recalled.  Please advise

## 2016-10-20 ENCOUNTER — Ambulatory Visit: Payer: Medicaid Other | Admitting: Pharmacist

## 2016-10-20 MED ORDER — GLUCOSE BLOOD VI STRP: ORAL_STRIP | 12 refills | Status: DC

## 2016-10-20 MED ORDER — ACCU-CHEK SOFTCLIX LANCET DEV MISC: 0 refills | Status: AC

## 2016-10-20 MED ORDER — ACCU-CHEK AVIVA PLUS W/DEVICE KIT: 1.0000 | PACK | Freq: Every day | 0 refills | Status: DC

## 2016-10-20 MED ORDER — ACCU-CHEK SOFTCLIX LANCETS MISC: 12 refills | Status: DC

## 2016-10-20 NOTE — Telephone Encounter (Signed)
LVM for pt to call office back to inform her of below. Zimmerman Rumple, [name], CMA  

## 2016-10-20 NOTE — Telephone Encounter (Signed)
I have sent in the diabetic meter and supplies that we have listed that Medicaid covers.   Phill Myron, D.O. 10/20/2016, 9:47 AM PGY-2, [place]

## 2016-10-30 ENCOUNTER — Telehealth: Payer: Self-pay | Admitting: Internal Medicine

## 2016-10-30 NOTE — Telephone Encounter (Signed)
N/a. Left message requesting pt to call back and schedule DM f/u. - Mesha Guinyard

## 2016-11-03 ENCOUNTER — Encounter (INDEPENDENT_AMBULATORY_CARE_PROVIDER_SITE_OTHER): Payer: Self-pay

## 2016-11-03 ENCOUNTER — Encounter: Payer: Self-pay | Admitting: Gastroenterology

## 2016-11-03 ENCOUNTER — Ambulatory Visit (INDEPENDENT_AMBULATORY_CARE_PROVIDER_SITE_OTHER): Payer: Medicaid Other | Admitting: Gastroenterology

## 2016-11-03 ENCOUNTER — Other Ambulatory Visit (INDEPENDENT_AMBULATORY_CARE_PROVIDER_SITE_OTHER): Payer: Medicaid Other

## 2016-11-03 VITALS — BP 150/80 | HR 80 | Ht 66.0 in | Wt 178.0 lb

## 2016-11-03 DIAGNOSIS — D869 Sarcoidosis, unspecified: Secondary | ICD-10-CM | POA: Diagnosis not present

## 2016-11-03 DIAGNOSIS — G40909 Epilepsy, unspecified, not intractable, without status epilepticus: Secondary | ICD-10-CM

## 2016-11-03 DIAGNOSIS — R51 Headache: Secondary | ICD-10-CM

## 2016-11-03 DIAGNOSIS — G8929 Other chronic pain: Secondary | ICD-10-CM

## 2016-11-03 DIAGNOSIS — R110 Nausea: Secondary | ICD-10-CM | POA: Diagnosis not present

## 2016-11-03 DIAGNOSIS — D8689 Sarcoidosis of other sites: Secondary | ICD-10-CM

## 2016-11-03 LAB — CBC WITH DIFFERENTIAL/PLATELET
BASOS ABS: 0.1 10*3/uL (ref 0.0–0.1)
BASOS PCT: 1.9 % (ref 0.0–3.0)
EOS ABS: 0.1 10*3/uL (ref 0.0–0.7)
Eosinophils Relative: 1.5 % (ref 0.0–5.0)
HCT: 43.4 % (ref 36.0–46.0)
Hemoglobin: 14.9 g/dL (ref 12.0–15.0)
LYMPHS ABS: 2.7 10*3/uL (ref 0.7–4.0)
Lymphocytes Relative: 37.9 % (ref 12.0–46.0)
MCHC: 34.3 g/dL (ref 30.0–36.0)
MCV: 86.7 fl (ref 78.0–100.0)
Monocytes Absolute: 0.5 10*3/uL (ref 0.1–1.0)
Monocytes Relative: 7.1 % (ref 3.0–12.0)
NEUTROS ABS: 3.7 10*3/uL (ref 1.4–7.7)
NEUTROS PCT: 51.6 % (ref 43.0–77.0)
PLATELETS: 253 10*3/uL (ref 150.0–400.0)
RBC: 5 Mil/uL (ref 3.87–5.11)
RDW: 12.8 % (ref 11.5–15.5)
WBC: 7.2 10*3/uL (ref 4.0–10.5)

## 2016-11-03 LAB — COMPREHENSIVE METABOLIC PANEL
ALBUMIN: 4.3 g/dL (ref 3.5–5.2)
ALK PHOS: 113 U/L (ref 39–117)
ALT: 16 U/L (ref 0–35)
AST: 17 U/L (ref 0–37)
BILIRUBIN TOTAL: 0.7 mg/dL (ref 0.2–1.2)
BUN: 6 mg/dL (ref 6–23)
CALCIUM: 9.7 mg/dL (ref 8.4–10.5)
CO2: 28 meq/L (ref 19–32)
CREATININE: 0.7 mg/dL (ref 0.40–1.20)
Chloride: 97 mEq/L (ref 96–112)
GFR: 116.34 mL/min (ref >60.00)
Glucose, Bld: 455 mg/dL — ABNORMAL HIGH (ref 70–99)
Potassium: 3.9 mEq/L (ref 3.5–5.1)
Sodium: 133 mEq/L — ABNORMAL LOW (ref 135–145)
TOTAL PROTEIN: 7.5 g/dL (ref 6.0–8.3)

## 2016-11-03 NOTE — Progress Notes (Signed)
HPI :  46 y/o female patient with a  history of type 1 diabetes, long QT syndrome, status post ICD. SVT, status post ablation. stable pulmonary nodules, IBS, fibromyalgia, peripheral neuropathy, here for follow up for hepatic / pulmonary sarcoidosis and chronic nausea. She previously had a [place] with elastography done otherwise showing a heterogenous liver without mass lesion, with elastography reported with F3-F4 score with high risk of fibrosis. She has had labs for chronic liver diseases which have been unremarkable other than elevated total IgG, with mild positive SMA, and negative ANA. 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 Portal pressures were normal.   She has been treated with a prolonged course of prednisone since her diagnosis, following with Dr. Kimber Relic of pulmonary. Since being on this regimen her AP has downtrended nicely. At the time of her last visit she complained of severe headaches and ongoing nausea. Recommended MRI of the head but she was not a candidate for this due to ICD in place. Discussed CT head with contrast for headaches bu she has anaphylaxis to IVP dye and did not want to proceed.    She was referred to Neurology. 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 Placed on lamotrigine and Keppra 5109m BID.   She reports she has been having severe headaches which is bothering her and very frustrating for her. She is looking for second opinion from another Neurologist. She still has frequent headaches. She is having them daily, severity fluctuates. Last week it was severe. She is nauseated frequently. She is not vomiting. She is taking zofran 2-3 times per day, she doesn't think it helps too much. She was just seen by opthomology yesterday, and told her she had "increased pressure" in her eyes and was told she needed to see Neurology. She is taking protonix once daily. She is taking prednisone 533monce  daily. She reports her breathing is stable. She is taking naproxen PRN for arthritis, takes it rarely. She took some FD gard which did help somewhat in the past.    She is not having any rectal bleeding or changes in bowel habits. No prior colon cancer screening   Prior workup: 10/24/14 Upper endoscopy:Normal, no varices noted 10/25/14 oral contrast only CT abdomen pelvis: Probable mild hepatosplenomegaly. Enlarged uterus containing large left leiomyoma.  12/03/14 Gastric emptying study normal Colonoscopy 2006 - normal Flex sig 2010 - hemorrhoids Flex sig 2011 - banded hemorrhoids   Past Medical History:  Diagnosis Date  . Abdominal pain, periumbilic   . Allergic rhinitis   . Arthritis   . Asthma   . Automatic implantable cardioverter-defibrillator in situ 2006, replaced 2008   [employer]TRONIC  . Bronchitis   . Cancer ([facility]   Nodules in lungs pt believes were cancerous pt unsure  . DM [place]diabetes mellitus) ([facility]   type 1, initially gestational but soon after child birth developed IDDM  . Esophageal stricture 2005/2009   esophageal strictures dilated 2005, 2009  . Eye hemorrhage    bilateral  . Fibroid, uterine   . Fibromyalgia   . GERD (gastroesophageal reflux disease) 2005  . History of blood transfusion years ago  . Internal hemorrhoids 2010   2011 band ligation of int rrhoids.  . Long Q-T syndrome   . Neuropathy in diabetes ([facility]  . Palpitation   . Sarcoidosis   . Seizure disorder ([facility]   none recently  . Seizures (  [place])   . Stroke ([place])    QUESTIONABLE TIA      Past Surgical History:  Procedure Laterality Date  . AUTOMATIC IMPLANTABLE CARDIAC DEFIBRILLATOR SITU  2006,    ICD-Medtronic   Remote - Yes   . CERVIX REMOVAL    . DILITATION & CURRETTAGE/HYSTROSCOPY WITH HYDROTHERMAL ABLATION N/A 06/07/2013   Procedure: DILATATION & CURETTAGE/HYSTEROSCOPY WITH attempted HYDROTHERMAL ABLATION;  Surgeon: Frederico Hamman, MD;  Location: [facility];  Service: Gynecology;   Laterality: N/A;  . EP IMPLANTABLE DEVICE N/A 04/04/2015   Procedure:  ICD Generator Changeout;  Surgeon: Evans Lance, MD;  Location: [facility];  Service: Cardiovascular;  Laterality: N/A;  . ESOPHAGOGASTRODUODENOSCOPY (EGD) WITH PROPOFOL N/A 10/24/2014   Procedure: ESOPHAGOGASTRODUODENOSCOPY (EGD) WITH PROPOFOL;  Surgeon: Inda Castle, MD;  Location: [facility];  Service: Endoscopy;  Laterality: N/A;  . EXPLORATORY LAPAROTOMY  11/06/14   evacuation of post TAH/BSO hematoma  . HAND SURGERY Right    x 2  . HEMORRHOID BANDING  03/2010  . SALPINGECTOMY    . SVT ablation    . TOTAL ABDOMINAL HYSTERECTOMY W/ BILATERAL SALPINGOOPHORECTOMY  11/06/14   at [facility]. for menorrhagia, pelvic pain and enlarging uterine fibroids  . TUBAL LIGATION    . VIDEO BRONCHOSCOPY Bilateral 01/24/2015   Procedure: VIDEO BRONCHOSCOPY WITHOUT FLUORO;  Surgeon: Marshell Garfinkel, MD;  Location: [facility];  Service: Cardiopulmonary;  Laterality: Bilateral;   Family History  Problem Relation Age of Onset  . Heart attack Mother   . Lung cancer Maternal Uncle        x2  . Breast cancer Maternal Aunt        x2  . Stomach cancer Paternal Uncle   . Stomach cancer Paternal Grandfather   . Stomach cancer Paternal Grandmother   . Diabetes Father   . Positive PPD/TB Exposure Father   . Glaucoma Father   . Colon cancer Neg Hx    Social History  Substance Use Topics  . Smoking status: Never Smoker  . Smokeless tobacco: Never Used  . Alcohol use 0.0 oz/week     Comment: occ.   Current Outpatient Prescriptions  Medication Sig Dispense Refill  . ACCU-CHEK SOFTCLIX LANCETS lancets Use as instructed 100 each 12  . acetaminophen (TYLENOL) 500 MG tablet Take 1,000 mg by mouth every 6 (six) hours as needed for mild pain or fever. Reported on 07/22/2015    . beclomethasone (QVAR) 80 MCG/ACT inhaler Inhale 2 puffs into the lungs 2 (two) times daily. 1 Inhaler 5  . Blood Glucose Monitoring Suppl (ACCU-CHEK AVIVA  PLUS) w/Device KIT 1 Device by Does not apply route daily. 1 kit 0  . clonazePAM (KLONOPIN) 0.5 MG tablet Take 1 tablet (0.5 mg total) by mouth 2 (two) times daily as needed for anxiety. 60 tablet 3  . cycloSPORINE (RESTASIS) 0.05 % ophthalmic emulsion Place 1 drop into both eyes 2 (two) times daily. 0.4 mL 0  . fluticasone (FLONASE) 50 MCG/ACT nasal spray Place 2 sprays into both nostrils daily as needed for allergies.     Marland Kitchen gabapentin (NEURONTIN) 300 MG capsule Take 1 capsule (300 mg total) by mouth 3 (three) times daily. 90 capsule 2  . glucose blood (ACCU-CHEK AVIVA PLUS) test strip Use as instructed 100 each 12  . Insulin Glargine (LANTUS SOLOSTAR) 100 UNIT/ML Solostar Pen Inject 40 Units into the skin at bedtime. 30 mL 0  . lactulose (CHRONULAC) 10 GM/15ML solution Take 30 ml TID 2700 mL 2  .  lamoTRIgine (LAMICTAL) 150 MG tablet Take 1 tablet (150 mg total) by mouth 2 (two) times daily. 60 tablet 5  . Lancet Devices (ACCU-CHEK SOFTCLIX) lancets Use as instructed 1 each 0  . levETIRAcetam (KEPPRA) 500 MG tablet Take 1 tablet (500 mg total) by mouth 2 (two) times daily. 60 tablet 5  . Linaclotide (LINZESS) 145 MCG CAPS capsule Take 1 capsule (145 mcg total) by mouth daily. 30 capsule 3  . meclizine (ANTIVERT) 25 MG tablet Take 0.5 tablets (12.5 mg total) by mouth 3 (three) times daily as needed for dizziness. 30 tablet 0  . naproxen (NAPROSYN) 375 MG tablet Take 1 tablet (375 mg total) by mouth 2 (two) times daily with a meal. 60 tablet 3  . NOVOLOG FLEXPEN 100 UNIT/ML FlexPen Inject 4-8 Units into the skin 3 (three) times daily. 15 mL 0  . pantoprazole (PROTONIX) 40 MG tablet Take 1 tablet (40 mg total) by mouth daily at 6 (six) AM. 30 tablet 2  . PAZEO 0.7 % SOLN instill 1 drop into both eyes once daily  0  . potassium chloride SA (K-DUR,KLOR-CON) 20 MEQ tablet take 1 tablet by mouth once daily 30 tablet 2  . predniSONE (DELTASONE) 5 MG tablet Take by mouth daily with breakfast. 7.'5mg'$  daily     . propranolol (INDERAL) 80 MG tablet Take 1 tablet (80 mg total) by mouth 2 (two) times daily. 30 tablet 11  . sulfamethoxazole-trimethoprim (BACTRIM DS) 800-160 MG tablet Take 1 tablet by mouth 3 (three) times a week. 12 tablet 5   No current facility-administered medications for this visit.    Allergies  Allergen Reactions  . Contrast Media [Iodinated Diagnostic Agents] Anaphylaxis and Shortness Of Breath    Needed to be defibrillated and patient was pre-medicated with radiology 13 hour premeds.   . Augmentin [Amoxicillin-Pot Clavulanate] Other (See Comments)    "stomach hurt"  . Codeine Nausea And Vomiting    jittery  . Demerol [Meperidine]   . Iohexol      Code: RASH, Desc: PT STATES SHE IS ALLERGIC TO IV CONTRAST 05/28/06/RM, Onset Date: [id number]   . Morphine And Related Nausea And Vomiting     Review of Systems: All systems reviewed and negative except where noted in HPI.   Lab Results  Component Value Date   WBC 7.2 11/03/2016   HGB 14.9 11/03/2016   HCT 43.4 11/03/2016   MCV 86.7 11/03/2016   PLT 253.0 11/03/2016    Lab Results  Component Value Date   ALT 16 11/03/2016   AST 17 11/03/2016   ALKPHOS 113 11/03/2016   BILITOT 0.7 11/03/2016     Physical Exam: BP (!) 150/80   Pulse 80   Ht '5\' 6"'$  (1.676 m)   Wt 178 lb (80.7 kg)   LMP 07/23/2014   BMI 28.73 kg/m  Constitutional: Pleasant,well-developed, female in no acute distress. HEENT: Normocephalic and atraumatic. Conjunctivae are normal. No scleral icterus. Neck supple.  Cardiovascular: Normal rate, regular rhythm.  Pulmonary/chest: Effort normal and breath sounds normal. No wheezing, rales or rhonchi. Abdominal: Soft, nondistended, nontender.. There are no masses palpable. Extremities: no edema Lymphadenopathy: No cervical adenopathy noted. Neurological: Alert and oriented to person place and time. Skin: Skin is warm and dry. No rashes noted. Psychiatric: Normal mood and affect. Behavior is  normal.   ASSESSMENT AND PLAN: 46 year old female with hepatic and pulmonary sarcoidosis, here for follow-up.  Hepatic sarcoidosis - has responded very nicely to prolonged taper course of steroids per Dr. Kimber Relic.  Her AP and ALT have normalized. Further taper per Dr. Kimber Relic for her pulmonary disease but normal LFTs is excellent finding at this point. She did not have cirrhosis on her prior liver biopsy. I would monitor LFTs every 3 months at this point.   Chronic nausea / headaches - I think nausea is related to her poorly controlled headaches. She was diagnosed with a seizure disorder, has not been able to obtain CNS imaging due to ICD / contrast allergy. Question of possible neurosarcoidosis? She is quite frustrated by her ongoing headaches / symptoms and wants another opinion from Neurology. I will try to set this up for her with [facility]. Given her reported findings on eye exam have asked them to see her as soon as possible. Otherwise given prolonged QT have avoided reglan and phenergan. She has responded to FD gard in the past, provided her some additional samples for today. She agreed.   Colon cancer screening - due for routine screening. Do not think she can tolerate prep and needs headache issues addressed first. Once that issue has been stablized she can follow up and schedule colonoscopy. She agreed.   [place] Cellar, MD [facility] Pager [phone]

## 2016-11-03 NOTE — Patient Instructions (Addendum)
If you are age 45 or older, your body mass index should be between 23-30. Your Body mass index is 28.73 kg/m. If this is out of the aforementioned range listed, please consider follow up with your Primary Care Provider.  If you are age 88 or younger, your body mass index should be between 19-25. Your Body mass index is 28.73 kg/m. If this is out of the aformentioned range listed, please consider follow up with your Primary Care Provider.   Your physician has requested that you go to the basement for the following lab work before leaving today:  CBC, CMET

## 2016-11-04 ENCOUNTER — Other Ambulatory Visit: Payer: Self-pay

## 2016-11-04 DIAGNOSIS — D8689 Sarcoidosis of other sites: Secondary | ICD-10-CM

## 2016-11-04 DIAGNOSIS — D869 Sarcoidosis, unspecified: Secondary | ICD-10-CM

## 2016-11-05 ENCOUNTER — Ambulatory Visit (INDEPENDENT_AMBULATORY_CARE_PROVIDER_SITE_OTHER): Payer: Medicaid Other | Admitting: Neurology

## 2016-11-05 ENCOUNTER — Encounter: Payer: Self-pay | Admitting: Neurology

## 2016-11-05 VITALS — BP 134/86 | HR 83 | Ht 66.0 in | Wt 176.0 lb

## 2016-11-05 DIAGNOSIS — G932 Benign intracranial hypertension: Secondary | ICD-10-CM

## 2016-11-05 DIAGNOSIS — R519 Headache, unspecified: Secondary | ICD-10-CM

## 2016-11-05 DIAGNOSIS — R51 Headache: Secondary | ICD-10-CM

## 2016-11-05 DIAGNOSIS — G40B09 Juvenile myoclonic epilepsy, not intractable, without status epilepticus: Secondary | ICD-10-CM

## 2016-11-05 NOTE — Patient Instructions (Addendum)
1. Schedule head CT without contrast 2. We will get records from Dr. Tama High and see what he meant, if indicated, we will get you scheduled for a spinal tap 3. Continue all your current medications 4. Minimize Tylenol intake to 2-3 a week to avoid rebound headaches 5. Follow-up in 4 months, call for any changes  Seizure Precautions: 1. If medication has been prescribed for you to prevent seizures, take it exactly as directed.  Do not stop taking the medicine without talking to your doctor first, even if you have not had a seizure in a long time.   2. Avoid activities in which a seizure would cause danger to yourself or to others.  Don't operate dangerous machinery, swim alone, or climb in high or dangerous places, such as on ladders, roofs, or girders.  Do not drive unless your doctor says you may.  3. If you have any warning that you may have a seizure, lay down in a safe place where you can't hurt yourself.    4.  No driving for 6 months from last seizure, as per [facility].   Please refer to the following link on the [place] website for more information: [email or web address]   5.  Maintain good sleep hygiene. Avoid alcohol.  6.  Notify your neurology if you are planning pregnancy or if you become pregnant.  7.  Contact your doctor if you have any problems that may be related to the medicine you are taking.  8.  Call 911 and bring the patient back to the ED if:        A.  The seizure lasts longer than 5 minutes.       B.  The patient doesn't awaken shortly after the seizure  C.  The patient has new problems such as difficulty seeing, speaking or moving  D.  The patient was injured during the seizure  E.  The patient has a temperature over 102 F (39C)  F.  The patient vomited and now is having trouble breathing

## 2016-11-05 NOTE — Progress Notes (Signed)
NEUROLOGY CONSULTATION NOTE  [name] MRN: [id number] DOB: 07/01/1970  Referring provider: Dr. Leona Cellar Primary care provider: Dr. Phill Myron  Reason for consult:  headaches  Dear Dr Havery Moros:  Thank you for your kind referral of [name] for consultation of the above symptoms. Although her history is well known to you, please allow me to reiterate it for the purpose of our medical record. Records and images were personally reviewed where available.  HISTORY OF PRESENT ILLNESS: This is a 46 year old ambidextrous left-hand dominant woman with a history of sarcoidosis (pulmonary and hepatic), diabetes, ventricular tachycardia s/p AICD placement, long Q-T syndrome, juvenile myoclonic epilepsy, presenting for second opinion. She had been seeing neurologist Dr. Felecia Shelling and states that she grew up never treated for seizures but from time to time was told she had symptoms that sounded like seizure. She started seeing Neurology in her late 31s when she had episodes where she would briefly blackout and become confused, not knowing what was going on. She would have quick body jerks but no convulsive activity. She was taking clonazepam at that time but was not on medication when she started seeing Dr. Felecia Shelling in October 2017. An EEG done 03/20/2016 showed several 3-6 second bursts of generalized 3 Hz polyspike wave activity. There were also some temporal sharp waves seen. EEG reported as consistent with juvenile myoclonic epilepsy. She was started on Lamotrigine in November 2017, increased to 192m BID. Clonazepam 0.51mBID was added in November 2017, but per records it is prn for anxiety. Keppra 50027mID was added in March 2018 for seizure and headache prophylaxis. She reports last blackout episode was in March 2018. She continues to have myoclonic jerks a few times a week affecting her arms. They can occur anytime during the day. She denies any convulsions but has woken up  with her tongue bitten and her bed wet. Last episode of waking up with tongue bite was last week. She reports a history of bad headaches since elementary school, but now she feels her head is full with constant 4/10 pain waxing and waning in severity to 10/10 3-4 times a month. Pain is usually on the left parietal or vertex region, sometimes with pressure or throbbing on the left side. She has nausea and vomiting, occasional photo and phonophobia. She takes Tylenol on occasion, a small bottle lasts her a month, but recently she has not been taking anything. Occipital nerve block did not help in the past. She usually gets 3-4 hours of sleep due to chronic neck and back pain. She reports her mind is always wandering. She has a history of fibromyalgia and her body is always sore. She has diabetic neuropathy, and reports that there is always some tingling or numbness somewhere in her arms and legs. She has occasional constipation. She reports a fall last Wednesday. She denies any significant head injuries. She has been taking gabapentin for neuropathy for the past 4-5 years. She has occasional anxiety. She feels like her left leg drags more. She had seen ophthalmologist Dr. ChrTama Highd was told there was "increased pressure," records will be requested for review. She denies any olfactory/gustatory hallucinations, deja vu, rising epigastric sensation. Her mother had migraines, maternal grandfather had migraines.   Epilepsy Risk Factors:  There is a strong family history of seizures in her maternal grandfather, maternal uncles. She had a normal birth and early development.  There is no history of febrile convulsions, CNS infections such as meningitis/encephalitis,  significant traumatic brain injury, neurosurgical procedures.   I personally reviewed head CT without contrast done 11/2014 which did not show any acute changes.   PAST MEDICAL HISTORY: Past Medical History:  Diagnosis Date  . Abdominal pain,  periumbilic   . Allergic rhinitis   . Arthritis   . Asthma   . Automatic implantable cardioverter-defibrillator in situ 2006, replaced 2008   [employer]  . Bronchitis   . Cancer ([place])    Nodules in lungs pt believes were cancerous pt unsure  . DM (diabetes mellitus) ([place]) 1997/1998   type 1, initially gestational but soon after child birth developed IDDM  . Esophageal stricture 2005/2009   esophageal strictures dilated 2005, 2009  . Eye hemorrhage    bilateral  . Fibroid, uterine   . Fibromyalgia   . GERD (gastroesophageal reflux disease) 2005  . History of blood transfusion years ago  . Internal hemorrhoids 2010   2011 band ligation of int rrhoids.  . Long Q-T syndrome   . Neuropathy in diabetes ([place])   . Palpitation   . Sarcoidosis   . Seizure disorder ([place])    none recently  . Seizures ([place])   . Stroke ([place])    QUESTIONABLE TIA     PAST SURGICAL HISTORY: Past Surgical History:  Procedure Laterality Date  . AUTOMATIC IMPLANTABLE CARDIAC DEFIBRILLATOR SITU  2006,    ICD-Medtronic   Remote - Yes   . CERVIX REMOVAL    . DILITATION & CURRETTAGE/HYSTROSCOPY WITH HYDROTHERMAL ABLATION N/A 06/07/2013   Procedure: DILATATION & CURETTAGE/HYSTEROSCOPY WITH attempted HYDROTHERMAL ABLATION;  Surgeon: Frederico Hamman, MD;  Location: [facility];  Service: Gynecology;  Laterality: N/A;  . EP IMPLANTABLE DEVICE N/A 04/04/2015   Procedure:  ICD Generator Changeout;  Surgeon: Evans Lance, MD;  Location: [facility];  Service: Cardiovascular;  Laterality: N/A;  . ESOPHAGOGASTRODUODENOSCOPY (EGD) WITH PROPOFOL N/A 10/24/2014   Procedure: ESOPHAGOGASTRODUODENOSCOPY (EGD) WITH PROPOFOL;  Surgeon: Inda Castle, MD;  Location: [facility];  Service: Endoscopy;  Laterality: N/A;  . EXPLORATORY LAPAROTOMY  11/06/14   evacuation of post TAH/BSO hematoma  . HAND SURGERY Right    x 2  . HEMORRHOID BANDING  03/2010  . SALPINGECTOMY    . SVT ablation    . TOTAL ABDOMINAL HYSTERECTOMY W/  BILATERAL SALPINGOOPHORECTOMY  11/06/14   at [facility]. for menorrhagia, pelvic pain and enlarging uterine fibroids  . TUBAL LIGATION    . VIDEO BRONCHOSCOPY Bilateral 01/24/2015   Procedure: VIDEO BRONCHOSCOPY WITHOUT FLUORO;  Surgeon: Marshell Garfinkel, MD;  Location: [facility];  Service: Cardiopulmonary;  Laterality: Bilateral;    MEDICATIONS: Current Outpatient Prescriptions on File Prior to Visit  Medication Sig Dispense Refill  . ACCU-CHEK SOFTCLIX LANCETS lancets Use as instructed 100 each 12  . acetaminophen (TYLENOL) 500 MG tablet Take 1,000 mg by mouth every 6 (six) hours as needed for mild pain or fever. Reported on 07/22/2015    . beclomethasone (QVAR) 80 MCG/ACT inhaler Inhale 2 puffs into the lungs 2 (two) times daily. 1 Inhaler 5  . Blood Glucose Monitoring Suppl (ACCU-CHEK AVIVA PLUS) w/Device KIT 1 Device by Does not apply route daily. 1 kit 0  . clonazePAM (KLONOPIN) 0.5 MG tablet Take 1 tablet (0.5 mg total) by mouth 2 (two) times daily as needed for anxiety. 60 tablet 3  . cycloSPORINE (RESTASIS) 0.05 % ophthalmic emulsion Place 1 drop into both eyes 2 (two) times daily. 0.4 mL 0  . fluticasone (FLONASE) 50 MCG/ACT nasal spray Place  2 sprays into both nostrils daily as needed for allergies.     Marland Kitchen gabapentin (NEURONTIN) 300 MG capsule Take 1 capsule (300 mg total) by mouth 3 (three) times daily. 90 capsule 2  . glucose blood (ACCU-CHEK AVIVA PLUS) test strip Use as instructed 100 each 12  . Insulin Glargine (LANTUS SOLOSTAR) 100 UNIT/ML Solostar Pen Inject 40 Units into the skin at bedtime. 30 mL 0  . lactulose (CHRONULAC) 10 GM/15ML solution Take 30 ml TID 2700 mL 2  . lamoTRIgine (LAMICTAL) 150 MG tablet Take 1 tablet (150 mg total) by mouth 2 (two) times daily. 60 tablet 5  . Lancet Devices (ACCU-CHEK SOFTCLIX) lancets Use as instructed 1 each 0  . levETIRAcetam (KEPPRA) 500 MG tablet Take 1 tablet (500 mg total) by mouth 2 (two) times daily. 60 tablet 5  . Linaclotide (LINZESS)  145 MCG CAPS capsule Take 1 capsule (145 mcg total) by mouth daily. 30 capsule 3  . meclizine (ANTIVERT) 25 MG tablet Take 0.5 tablets (12.5 mg total) by mouth 3 (three) times daily as needed for dizziness. 30 tablet 0  . naproxen (NAPROSYN) 375 MG tablet Take 1 tablet (375 mg total) by mouth 2 (two) times daily with a meal. 60 tablet 3  . NOVOLOG FLEXPEN 100 UNIT/ML FlexPen Inject 4-8 Units into the skin 3 (three) times daily. 15 mL 0  . pantoprazole (PROTONIX) 40 MG tablet Take 1 tablet (40 mg total) by mouth daily at 6 (six) AM. 30 tablet 2  . PAZEO 0.7 % SOLN instill 1 drop into both eyes once daily  0  . potassium chloride SA (K-DUR,KLOR-CON) 20 MEQ tablet take 1 tablet by mouth once daily 30 tablet 2  . predniSONE (DELTASONE) 5 MG tablet Take by mouth daily with breakfast. 7.'5mg'$  daily    . propranolol (INDERAL) 80 MG tablet Take 1 tablet (80 mg total) by mouth 2 (two) times daily. 30 tablet 11  . sulfamethoxazole-trimethoprim (BACTRIM DS) 800-160 MG tablet Take 1 tablet by mouth 3 (three) times a week. 12 tablet 5   No current facility-administered medications on file prior to visit.     ALLERGIES: Allergies  Allergen Reactions  . Contrast Media [Iodinated Diagnostic Agents] Anaphylaxis and Shortness Of Breath    Needed to be defibrillated and patient was pre-medicated with radiology 13 hour premeds.   . Augmentin [Amoxicillin-Pot Clavulanate] Other (See Comments)    "stomach hurt"  . Codeine Nausea And Vomiting    jittery  . Demerol [Meperidine]   . Iohexol      Code: RASH, Desc: PT STATES SHE IS ALLERGIC TO IV CONTRAST 05/28/06/RM, Onset Date: [id number]   . Morphine And Related Nausea And Vomiting    FAMILY HISTORY: Family History  Problem Relation Age of Onset  . Heart attack Mother   . Lung cancer Maternal Uncle        x2  . Breast cancer Maternal Aunt        x2  . Stomach cancer Paternal Uncle   . Stomach cancer Paternal Grandfather   . Stomach cancer Paternal  Grandmother   . Diabetes Father   . Positive PPD/TB Exposure Father   . Glaucoma Father   . Colon cancer Neg Hx     SOCIAL HISTORY: Social History   Social History  . Marital status: Single    Spouse name: N/A  . Number of children: N/A  . Years of education: N/A   Occupational History  . Not on file.   Social  History Main Topics  . Smoking status: Never Smoker  . Smokeless tobacco: Never Used  . Alcohol use 0.0 oz/week     Comment: occ.  . Drug use: No  . Sexual activity: No   Other Topics Concern  . Not on file   Social History Narrative  . No narrative on file    REVIEW OF SYSTEMS: Constitutional: No fevers, chills, or sweats, no generalized fatigue, change in appetite Eyes: No visual changes, double vision, eye pain Ear, nose and throat: No hearing loss, ear pain, nasal congestion, sore throat Cardiovascular: No chest pain, palpitations Respiratory:  No shortness of breath at rest or with exertion, wheezes GastrointestinaI: + nausea, vomiting, no diarrhea, abdominal pain, fecal incontinence Genitourinary:  No dysuria, urinary retention or frequency Musculoskeletal:  + neck pain, back pain Integumentary: No rash, pruritus, skin lesions Neurological: as above Psychiatric: No depression, insomnia, anxiety Endocrine: No palpitations, fatigue, diaphoresis, mood swings, change in appetite, change in weight, increased thirst Hematologic/Lymphatic:  No anemia, purpura, petechiae. Allergic/Immunologic: no itchy/runny eyes, nasal congestion, recent allergic reactions, rashes  PHYSICAL EXAM: Vitals:   11/05/16 1038  BP: 134/86  Pulse: 83   General: No acute distress Head:  Normocephalic/atraumatic Eyes: unable to visualize fundi Neck: supple, no paraspinal tenderness, full range of motion Back: No paraspinal tenderness Heart: regular rate and rhythm Lungs: Clear to auscultation bilaterally. Vascular: No carotid bruits. Skin/Extremities: No rash, no  edema Neurological Exam: Mental status: alert and oriented to person, place, and time, no dysarthria or aphasia, Fund of knowledge is appropriate.  Recent and remote memory are intact.  Attention and concentration are normal.    Able to name objects and repeat phrases. Cranial nerves: CN I: not tested CN II: pupils equal, round and reactive to light, visual fields intact, unable to visualize fundi CN III, IV, VI:  full range of motion, no nystagmus, no ptosis CN V: facial sensation intact CN VII: upper and lower face symmetric CN VIII: hearing intact to finger rub CN IX, X: gag intact, uvula midline CN XI: sternocleidomastoid and trapezius muscles intact CN XII: tongue midline Bulk & Tone: normal, no fasciculations. Motor: 5/5 throughout with no pronator drift. Sensation: decreased cold and pin on left UE, decreased pin and vibration to ankles bilaterally. Romberg test negative Deep Tendon Reflexes: +2 throughout except for absent ankle jerks, no ankle clonus Plantar responses: downgoing bilaterally Cerebellar: no incoordination on finger to nose, heel to shin. No dysdiadochokinesia Gait: narrow-based and steady, able to tandem walk adequately. Tremor: none  IMPRESSION: This is a 46 year old ambidextrous left-hand dominant woman with a history of sarcoidosis (pulmonary and hepatic), diabetes, ventricular tachycardia s/p AICD placement, long Q-T syndrome, juvenile myoclonic epilepsy, presenting for second opinion of headaches and seizures. She reports episodes of blacking out and myoclonic jerks, EEG done at Dr. Garth Bigness office reported generalized 3 Hz polyspike wave discharges consistent with a generalized epilepsy. She is on Lamotrigine 13m BID and Keppra 5065mBID and continues to report myoclonic jerks. Last blackout was in March 2018. Her main concern are the headaches, she has daily headaches that wax and wane in intensity, there are some migrainous features to the headaches, however  she reports her eye doctor told her there was "increased pressure," raising the question of pseudotumor cerebri. Head CT with and without contrast will be ordered to assess for underlying structural abnormality. Records from Dr. ShManuella Ghaziill be requested for review, if there is evidence of papilledema, we will do a lumbar puncture  to measure opening pressure. She agrees to wait for review before starting medication, Topamax is both a seizure medication, as well as headache preventative that can also help with increased intracranial pressure. She was advised to minimize Tylenol intake to 2-2 a week to avoid rebound headaches. [place] driving laws were discussed with the patient, and she knows to stop driving after a seizure, until 6 months seizure-free. She will follow-up in 4 months and knows to call for any changes.   Thank you for allowing me to participate in the care of this patient. Please do not hesitate to call for any questions or concerns.   Ellouise Newer, M.D.  CC: Dr. Havery Moros, Dr. Juleen China

## 2016-11-09 ENCOUNTER — Encounter: Payer: Self-pay | Admitting: Neurology

## 2016-11-09 DIAGNOSIS — R51 Headache: Secondary | ICD-10-CM

## 2016-11-09 DIAGNOSIS — R519 Headache, unspecified: Secondary | ICD-10-CM | POA: Insufficient documentation

## 2016-11-11 ENCOUNTER — Ambulatory Visit
Admission: RE | Admit: 2016-11-11 | Discharge: 2016-11-11 | Disposition: A | Discharge status: Home / Self Care | Payer: Medicaid Other | Source: Ambulatory Visit | Attending: Neurology | Admitting: Neurology

## 2016-11-11 ENCOUNTER — Telehealth: Payer: Self-pay

## 2016-11-11 DIAGNOSIS — G932 Benign intracranial hypertension: Secondary | ICD-10-CM

## 2016-11-11 NOTE — Telephone Encounter (Signed)
Called Dr. Trena Platt office.  Awaiting records via fax

## 2016-11-11 NOTE — Telephone Encounter (Signed)
-----   Message from Cameron Sprang, MD sent at 11/11/2016 10:56 AM EDT ----- Regarding: RE: ophtho notes review Have we received ophtho notes from Dr. Manuella Ghazi?   Thanks Santiago Glad  ----- Message ----- From: Cameron Sprang, MD Sent: 11/09/2016   3:02 PM To: Cameron Sprang, MD Subject: ophtho notes review                            reminder

## 2016-11-13 ENCOUNTER — Telehealth: Payer: Self-pay | Admitting: Neurology

## 2016-11-13 NOTE — Telephone Encounter (Signed)
Left VM about normal head CT and no evidence of papilledema on ophtho notes. Would proceed with starting Topamax as we discussed in office, instructed patient to call on Monday for instructions.

## 2016-11-17 ENCOUNTER — Other Ambulatory Visit: Payer: Self-pay

## 2016-11-17 MED ORDER — TOPIRAMATE 25 MG PO TABS: ORAL_TABLET | ORAL | 6 refills | Status: DC

## 2016-11-17 NOTE — Telephone Encounter (Signed)
Pls let her know I sent the Rx to [facility] for Topamax 25mg  qhs x 1 week, then increase to 2 tabs qhs. Pls let her know that sometimes people have numbness and tingling in their hands and feet when first starting the medication, this usually goes away as body gets used to medication. Sodas can taste different. There is a less than 1% risk for kidney stones, she should drink lots of water, which we need to do anyway. Thanks

## 2016-11-17 NOTE — Telephone Encounter (Signed)
Patient wants to know if we called in the topamax yet

## 2016-11-17 NOTE — Telephone Encounter (Signed)
LMOM relaying message below.  

## 2016-11-19 ENCOUNTER — Ambulatory Visit: Payer: Medicaid Other | Admitting: Neurology

## 2016-11-19 ENCOUNTER — Telehealth: Payer: Self-pay | Admitting: Cardiology

## 2016-11-19 NOTE — Telephone Encounter (Signed)
Patient called wanting to know if she could fly and what is the protocol for airport security. I informed pt that she could fly and just to make sure she had her ID card proving that she had and ICD and show that to airport security and that airport security had protocols in place for patients w/ ICD. Pt verbalized understanding.

## 2016-11-23 ENCOUNTER — Encounter: Payer: Self-pay | Admitting: Neurology

## 2016-12-21 ENCOUNTER — Ambulatory Visit (INDEPENDENT_AMBULATORY_CARE_PROVIDER_SITE_OTHER): Payer: Medicaid Other | Admitting: *Deleted

## 2016-12-21 DIAGNOSIS — I4581 Long QT syndrome: Secondary | ICD-10-CM | POA: Diagnosis not present

## 2016-12-22 NOTE — Progress Notes (Signed)
Remote ICD transmission.   

## 2016-12-23 ENCOUNTER — Encounter: Payer: Self-pay | Admitting: Cardiology

## 2016-12-30 ENCOUNTER — Telehealth: Payer: Self-pay | Admitting: Internal Medicine

## 2016-12-30 ENCOUNTER — Ambulatory Visit (INDEPENDENT_AMBULATORY_CARE_PROVIDER_SITE_OTHER): Payer: Medicaid Other | Admitting: Family Medicine

## 2016-12-30 DIAGNOSIS — H538 Other visual disturbances: Secondary | ICD-10-CM | POA: Diagnosis present

## 2016-12-30 MED ORDER — MECLIZINE HCL 32 MG PO TABS: 32.0000 mg | ORAL_TABLET | Freq: Three times a day (TID) | ORAL | 0 refills | Status: DC | PRN

## 2016-12-30 NOTE — Patient Instructions (Addendum)
It was great to meet you today!  Your meclizine prescription has been renewed.  Please take this medication as needed for dizziness.  If your symptoms become worse, please contact us or your neurologist's office.  If you experience difficulty speaking, sudden, one-sided weakness, or facial drooping, please go to the Emergency Department.  We will see you in three months for a follow-up on your symptoms as well as for your diabetes.  Take care, Dr. Shan Levans

## 2016-12-30 NOTE — Telephone Encounter (Signed)
Patient calling, states that she felt like she was going to faint while driving on this past Friday. Patient states that she did not have any "warning" that she was going to pass out. Patient is not sure what she needs to do, patient still does not feel well.

## 2016-12-30 NOTE — Telephone Encounter (Signed)
Spoke with [name], informed her that her transmission was received and there were no episodes and that her device was functioning normally, asked if she had a way to check her BP, she stated that she did not and that her primary care provider was supposed to be "giving her a prescription" she stated that she was going to follow up with her primary care provider

## 2016-12-30 NOTE — Assessment & Plan Note (Addendum)
DDx is broad and includes arrhythmia (ruled out d/t ICD monitor), stroke/TIA (no focal deficits observed), hypoglycemia, absence seizure, migraine, medication effect, orthostatic hypotension, vagal response, and vertigo.  The most life-threatening causes - arrhythmia and stoke - are very unlikely in this case  We will prescribe meclizine and have her follow up with [facility] or her neurologist if symptoms persist.  She was advised to go to the ED if she develops chest pain, palpitations that do not resolve, focal weakness, slurred speech, or facial asymmetry.

## 2016-12-30 NOTE — Progress Notes (Signed)
Subjective:    [name] - 46 y.o. female MRN [id number]  Date of birth: Jun 27, 1970  HPI  [name] is here for a recent episode of blurry vision while driving.  She says that the episode had a sudden onset and occurred about five minutes after she began to drive.  She was able to stop at her aunt's house, and her vision improved about 10 minutes later.  This was accompanied by a feeling of light-headedness and a sensation that the room was spinning.  She called her cardiologist afterward to ask if her ICD had recorded an arrhythmia, and it was interrogated and found to have no abnormal recordings.  She denies focal weakness, slurred speech, or facial asymmetry.  She says that she has had chronic migraine headaches but denies having a headache at the time or afterward.  She never lost consciousness and denies palpitations.  She has never had low blood sugars at home - she always runs high and has never experienced hypoglycemia except for when she was in the hospital.       Health Maintenance:  Health Maintenance Due  Topic Date Due  . OPHTHALMOLOGY EXAM  10/13/1981  . TETANUS/TDAP  10/14/1990  . FOOT EXAM  10/03/2016  . URINE MICROALBUMIN  12/11/2016  . INFLUENZA VACCINE  12/16/2016    -  reports that she has never smoked. She has never used smokeless tobacco. - Review of Systems: Per HPI. - Past Medical History: Patient Active Problem List   Diagnosis Date Noted  . Blurry vision, bilateral 12/30/2016  . Chronic daily headache 11/09/2016  . Neck pain 07/20/2016  . Lower extremity weakness 06/17/2016  . Juvenile myoclonic epilepsy ([place]) 03/23/2016  . Occipital neuralgia of left side 03/05/2016  . Other headache syndrome 03/05/2016  . Gait disturbance 02/12/2015  . 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 Chronic fatigue 01/01/2015  . Nausea without vomiting 12/11/2014  . Dyspnea 12/10/2014  . Weight loss   . Long QT interval  11/06/2014  . PSVT 02/24/2010  . PRECORDIAL PAIN 08/22/2009  . Palpitations 10/05/2008  . Automatic implantable cardioverter-defibrillator in situ 10/05/2008  . Absence seizure disorder ([place]) 09/26/2008  . Constipation 02/08/2008  . Sarcoidosis, stage 3 03/15/2007  . Diabetes mellitus, type 2 ([place]) 03/15/2007  . Essential hypertension 03/15/2007  . SYNDROME, LONG QT 03/15/2007  . ALLERGIC RHINITIS 03/15/2007  . OBSTRUCTIVE CHRONIC BRONCHITIS 03/15/2007  . ESOPHAGITIS 02/05/2004  . ESOPHAGEAL STRICTURE 02/05/2004   - Medications: reviewed and updated   Objective:   Physical Exam BP (!) 138/92 (BP Location: Left Arm, Patient Position: Sitting, Cuff Size: Normal)   Pulse 78   Temp 98.4 F (36.9 C) (Oral)   Wt 177 lb 9.6 oz (80.6 kg)   LMP 07/23/2014   SpO2 96%   BMI 28.67 kg/m  Gen: NAD, alert, cooperative with exam, well-appearing HEENT: NCAT, PERRL, clear conjunctiva, supple neck Fundoscopic: No gross deficits seen, some vessels visualized, no retinal detachment noted CV: RRR, good S1/S2, no murmur, no edema, capillary refill brisk  Resp: CTABL, no wheezes, non-labored Neuro: no gross deficits. No dizziness elicited with Dix-Hallpike maneuver. Psych: good insight, alert and oriented        Assessment & Plan:   Blurry vision, bilateral DDx is broad and includes arrhythmia (ruled out d/t ICD monitor), stroke/TIA (no focal deficits observed), hypoglycemia, absence seizure, migraine, medication effect, orthostatic hypotension, vagal response, and vertigo.  The most life-threatening causes - arrhythmia  and stoke - are very unlikely in this case  We will prescribe meclizine and have her follow up with [facility] or her neurologist if symptoms persist.  She was advised to go to the ED if she develops chest pain, palpitations that do not resolve, focal weakness, slurred speech, or facial asymmetry.    Maia Breslow, M.D. 12/30/2016, 5:13 PM PGY-1, [place]

## 2016-12-30 NOTE — Telephone Encounter (Signed)
Requested that pt send in a manual transmission, pt stated that she would and would call the device clinic.

## 2017-01-04 ENCOUNTER — Other Ambulatory Visit: Payer: Self-pay | Admitting: *Deleted

## 2017-01-05 MED ORDER — INSULIN GLARGINE 100 UNIT/ML SOLOSTAR PEN: 40.0000 [IU] | PEN_INJECTOR | Freq: Every day | SUBCUTANEOUS | 5 refills | Status: DC

## 2017-01-07 LAB — CUP PACEART REMOTE DEVICE CHECK
Brady Statistic RV Percent Paced: 0.01 %
Date Time Interrogation Session: 20180806052403
HIGH POWER IMPEDANCE MEASURED VALUE: 68 Ohm
HighPow Impedance: 46 Ohm
Implantable Lead Implant Date: 20080828
Implantable Lead Location: 753860
Implantable Pulse Generator Implant Date: 20161117
Lead Channel Impedance Value: 323 Ohm
Lead Channel Pacing Threshold Amplitude: 1.625 V
Lead Channel Pacing Threshold Pulse Width: 0.4 ms
Lead Channel Sensing Intrinsic Amplitude: 3.75 mV
MDC IDC MSMT BATTERY REMAINING LONGEVITY: 126 mo
MDC IDC MSMT BATTERY VOLTAGE: 3.04 V
MDC IDC MSMT LEADCHNL RV IMPEDANCE VALUE: 380 Ohm
MDC IDC MSMT LEADCHNL RV SENSING INTR AMPL: 3.75 mV
MDC IDC SET LEADCHNL RV PACING AMPLITUDE: 3.25 V
MDC IDC SET LEADCHNL RV PACING PULSEWIDTH: 0.4 ms
MDC IDC SET LEADCHNL RV SENSING SENSITIVITY: 0.3 mV

## 2017-01-08 ENCOUNTER — Encounter: Payer: Self-pay | Admitting: Gastroenterology

## 2017-01-11 ENCOUNTER — Ambulatory Visit: Payer: Medicaid Other | Admitting: Pulmonary Disease

## 2017-01-12 ENCOUNTER — Encounter: Payer: Self-pay | Admitting: Pulmonary Disease

## 2017-01-12 ENCOUNTER — Ambulatory Visit (INDEPENDENT_AMBULATORY_CARE_PROVIDER_SITE_OTHER): Payer: Medicaid Other | Admitting: Pulmonary Disease

## 2017-01-12 VITALS — BP 122/60 | HR 73 | Ht 66.0 in | Wt 178.6 lb

## 2017-01-12 DIAGNOSIS — D862 Sarcoidosis of lung with sarcoidosis of lymph nodes: Secondary | ICD-10-CM | POA: Diagnosis not present

## 2017-01-12 NOTE — Patient Instructions (Signed)
Stop taking the prednisone and monitor symptoms. Please call us if there is any worsening of her dyspnea, fatigue.  Return to clinic in 3-4 months.

## 2017-01-12 NOTE — Progress Notes (Signed)
[name]    [id number]    June 02, 1970  Primary Care Physician:Wallace, Bayard Beaver, DO  Referring Physician: Nicolette Bang, DO [address] 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 Asthmatic bronchitis Chronic cough Ventricular tachycardia s/p AICD. Long QT syndrome Left pleural effusion April 30, 2015. Improved>> Thoracentesis >>exudative   HPI: [name] is 46 year old with stage III sarcoidosis, asthmatic bronchitis, chronic cough. She is a former patient of Dr. Joya Gaskins. She was diagnosed around 2003. There is record of a transbronchial biopsy in 2003 but no granulomas were seen. She had been on and off systemic prednisone. She was admitted in September 2016 with sarcoidosis flare. Bronchial with BAL showed 10K staph aureus which was thought to be a contaminant. She was given Solu-Medrol 60 q6 and then discharged on a slow prednisone taper starting at 60 mg. She is also being followed by cardiology for prolonged QT syndrome, family history of sudden death. Her ICD was changed in 2015/04/30.   She underwent a thoracentesis in November 16 which showed exudative left-sided predominant effusion, and cultures were negative. Repeat CXR showed improvement in her effusion. She she had a liver biopsy early 2017 that showed hepatic sarcoidosis with elevated LFTs. She's been on a prolonged steroid taper with good response and her last LFTs have normalized. We reduced her prednisone to 30 mg in July and then to instructed her to reduce the dose to 20 mg in 29-Apr-2016. But for some reason she has actually reduced the dose to 10 mg/day. 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 She is currently on lamotrigine and clonazepam.    Interim History: Prednisone reduced to 2.5 mg daily at  last visit. She is tolerating this well. She reports chronic fatigue, dyspnea which is unchanged from before. She's had a second opinion for constant headaches at the bar neurology and has been started on Topamax. She's had brief episode of blurry vision earlier this month of unclear etiology. This has not recurred since then.                         Outpatient Encounter Prescriptions as of 01/12/2017  Medication Sig  . ACCU-CHEK SOFTCLIX LANCETS lancets Use as instructed  . acetaminophen (TYLENOL) 500 MG tablet Take 1,000 mg by mouth every 6 (six) hours as needed for mild pain or fever. Reported on 07/22/2015  . beclomethasone (QVAR) 80 MCG/ACT inhaler Inhale 2 puffs into the lungs 2 (two) times daily.  . Blood Glucose Monitoring Suppl (ACCU-CHEK AVIVA PLUS) w/Device KIT 1 Device by Does not apply route daily.  . clonazePAM (KLONOPIN) 0.5 MG tablet Take 1 tablet (0.5 mg total) by mouth 2 (two) times daily as needed for anxiety.  . fluticasone (FLONASE) 50 MCG/ACT nasal spray Place 2 sprays into both nostrils daily as needed for allergies.   Marland Kitchen gabapentin (NEURONTIN) 300 MG capsule Take 1 capsule (300 mg total) by mouth 3 (three) times daily.  Marland Kitchen glucose blood (ACCU-CHEK AVIVA PLUS) test strip Use as instructed  . Insulin Glargine (LANTUS SOLOSTAR) 100 UNIT/ML Solostar Pen Inject 40 Units into the skin at bedtime.  Marland Kitchen lactulose (CHRONULAC) 10 GM/15ML solution Take 30 ml TID  . lamoTRIgine (LAMICTAL) 150 MG tablet Take 1 tablet (150 mg total) by mouth 2 (two) times  daily.  . Lancet Devices (ACCU-CHEK SOFTCLIX) lancets Use as instructed  . levETIRAcetam (KEPPRA) 500 MG tablet Take 1 tablet (500 mg total) by mouth 2 (two) times daily.  . Linaclotide (LINZESS) 145 MCG CAPS capsule Take 1 capsule (145 mcg total) by mouth daily.  . meclizine (ANTIVERT) 32 MG tablet Take 1 tablet (32 mg total) by mouth 3 (three) times daily as needed.  . naproxen (NAPROSYN) 375 MG tablet Take 1 tablet (375 mg total) by mouth  2 (two) times daily with a meal.  . NOVOLOG FLEXPEN 100 UNIT/ML FlexPen Inject 4-8 Units into the skin 3 (three) times daily.  . pantoprazole (PROTONIX) 40 MG tablet Take 1 tablet (40 mg total) by mouth daily at 6 (six) AM.  . potassium chloride SA (K-DUR,KLOR-CON) 20 MEQ tablet take 1 tablet by mouth once daily  . predniSONE (DELTASONE) 5 MG tablet Take by mouth daily with breakfast. 7.34m daily  . propranolol (INDERAL) 80 MG tablet Take 1 tablet (80 mg total) by mouth 2 (two) times daily.  .Marland Kitchensulfamethoxazole-trimethoprim (BACTRIM DS) 800-160 MG tablet Take 1 tablet by mouth 3 (three) times a week.  . topiramate (TOPAMAX) 25 MG tablet Take 1 tablet at night for 1 week, then increase to 2 tabs at night  . [DISCONTINUED] cycloSPORINE (RESTASIS) 0.05 % ophthalmic emulsion Place 1 drop into both eyes 2 (two) times daily.  . [DISCONTINUED] PAZEO 0.7 % SOLN instill 1 drop into both eyes once daily   No facility-administered encounter medications on file as of 01/12/2017.     Allergies as of 01/12/2017 - Review Complete 01/12/2017  Allergen Reaction Noted  . Contrast media [iodinated diagnostic agents] Anaphylaxis and Shortness Of Breath 05/30/2013  . Augmentin [amoxicillin-pot clavulanate] Other (See Comments) 10/23/2014  . Codeine Nausea And Vomiting   . Demerol [meperidine]  07/22/2015  . Iohexol  05/28/2006  . Morphine and related Nausea And Vomiting 05/30/2013    Past Medical History:  Diagnosis Date  . Abdominal pain, periumbilic   . Allergic rhinitis   . Arthritis   . Asthma   . Automatic implantable cardioverter-defibrillator in situ 2006, replaced 2008   [employer]TRONIC  . Bronchitis   . Cancer ([facility]    Nodules in lungs pt believes were cancerous pt unsure  . DM (diabetes mellitus) ([facility] 1997/1998   type 1, initially gestational but soon after child birth developed IDDM  . Esophageal stricture 2005/2009   esophageal strictures dilated 2005, 2009  . Eye hemorrhage    bilateral  .  Fibroid, uterine   . Fibromyalgia   . GERD (gastroesophageal reflux disease) 2005  . History of blood transfusion years ago  . Internal hemorrhoids 2010   2011 band ligation of int rrhoids.  . Long Q-T syndrome   . Neuropathy in diabetes ([facility]   . Palpitation   . Sarcoidosis   . Seizure disorder ([facility]    none recently  . Seizures ([facility]   . Stroke ([facility]    QUESTIONABLE TIA     Past Surgical History:  Procedure Laterality Date  . AUTOMATIC IMPLANTABLE CARDIAC DEFIBRILLATOR SITU  2006,    ICD-Medtronic   Remote - Yes   . CERVIX REMOVAL    . DILITATION & CURRETTAGE/HYSTROSCOPY WITH HYDROTHERMAL ABLATION N/A 06/07/2013   Procedure: DILATATION & CURETTAGE/HYSTEROSCOPY WITH attempted HYDROTHERMAL ABLATION;  Surgeon: BFrederico Hamman MD;  [facility];  Service: Gynecology;  Laterality: N/A;  . EP IMPLANTABLE DEVICE N/A 04/04/2015   Procedure:  ICD Generator Changeout;  Surgeon:  Evans Lance, MD;  Location: [facility];  Service: Cardiovascular;  Laterality: N/A;  . ESOPHAGOGASTRODUODENOSCOPY (EGD) WITH PROPOFOL N/A 10/24/2014   Procedure: ESOPHAGOGASTRODUODENOSCOPY (EGD) WITH PROPOFOL;  Surgeon: Inda Castle, MD;  Location: [facility];  Service: Endoscopy;  Laterality: N/A;  . EXPLORATORY LAPAROTOMY  11/06/14   evacuation of post TAH/BSO hematoma  . HAND SURGERY Right    x 2  . HEMORRHOID BANDING  03/2010  . SALPINGECTOMY    . SVT ablation    . TOTAL ABDOMINAL HYSTERECTOMY W/ BILATERAL SALPINGOOPHORECTOMY  11/06/14   at [facility] for menorrhagia, pelvic pain and enlarging uterine fibroids  . TUBAL LIGATION    . VIDEO BRONCHOSCOPY Bilateral 01/24/2015   Procedure: VIDEO BRONCHOSCOPY WITHOUT FLUORO;  Surgeon: Marshell Garfinkel, MD;  Location: [facility];  Service: Cardiopulmonary;  Laterality: Bilateral;    Family History  Problem Relation Age of Onset  . Heart attack Mother   . Lung cancer Maternal Uncle        x2  . Breast cancer Maternal Aunt        x2  . Stomach  cancer Paternal Uncle   . Stomach cancer Paternal Grandfather   . Stomach cancer Paternal Grandmother   . Diabetes Father   . Positive PPD/TB Exposure Father   . Glaucoma Father   . Colon cancer Neg Hx     Social History   Social History  . Marital status: Single    Spouse name: N/A  . Number of children: N/A  . Years of education: N/A   Occupational History  . Not on file.   Social History Main Topics  . Smoking status: Never Smoker  . Smokeless tobacco: Never Used  . Alcohol use 0.0 oz/week     Comment: occ.  . Drug use: No  . Sexual activity: No   Other Topics Concern  . Not on file   Social History Narrative  . No narrative on file   Review of systems: Review of Systems  Constitutional: Negative for fever and chills.  HENT: Negative.   Eyes: Negative for blurred vision.  Respiratory: as per HPI  Cardiovascular: Negative for chest pain and palpitations.  Gastrointestinal: Negative for vomiting, diarrhea, blood per rectum. Genitourinary: Negative for dysuria, urgency, frequency and hematuria.  Musculoskeletal: Negative for myalgias, back pain and joint pain.  Skin: Negative for itching and rash.  Neurological: Negative for dizziness, tremors, focal weakness, seizures and loss of consciousness.  Endo/Heme/Allergies: Negative for environmental allergies.  Psychiatric/Behavioral: Negative for depression, suicidal ideas and hallucinations.  All other systems reviewed and are negative.  Physical Exam: Blood pressure 122/60, pulse 73, height 5' 6"  (1.676 m), weight 81 kg (178 lb 9.6 oz), last menstrual period 07/23/2014, SpO2 95 %. Gen:      No acute distress HEENT:  EOMI, sclera anicteric Neck:     No masses; no thyromegaly Lungs:    Clear to auscultation bilaterally; normal respiratory effort CV:         Regular rate and rhythm; no murmurs Abd:      + bowel sounds; soft, non-tender; no palpable masses, no distension Ext:    No edema; adequate peripheral  perfusion Skin:      Warm and dry; no rash Neuro: alert and oriented x 3 Psych: normal mood and affect  Data Reviewed: PFTs (08/29/14) FVC 2.98 (85%) FEV1 2.42 (85%) F/F 81  IMAGING: CT scan (01/21/15) 1. There is a spectrum of findings in the thorax that is most likely  related to the patient's history of sarcoidosis. Specifically, there is a perilymphatic distribution of multiple tiny subcentimeter pulmonary nodules, in addition to mediastinal lymphadenopathy. 2. Dilatation of the pulmonic trunk (3.6 cm in diameter), suggestive of pulmonary arterial hypertension. 3. Splenomegaly. 4. Probable hepatomegaly.  CXR (03/03/15) Moderate left pleural effusion. Adjacent left lower lobe atelectasis versus infection. Possible lingular airspace disease as well. New enlargement of the cardiopericardial silhouette, which could relate to cardiomegaly and/or pericardial effusion.  CXR (06/13/15) Cardiomegaly, mild CHF, improved left effusion   CXR (07/28/16) Mild infrahilar opacity on chronic fibrotic changes suggestive of pneumonia  I have reviewed all images personally.  LABS: Echo 01/28/15 EF 60-65%, PAP 36.  ACE level 01/22/15 - 177.   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  Hepatic Function Latest Ref Rng & Units 11/03/2016 06/15/2016 01/23/2016  Total Protein 6.0 - 8.3 g/dL 7.5 7.7 7.9  Albumin 3.5 - 5.2 g/dL 4.3 4.1 4.1  AST 0 - 37 U/L 17 17 21   ALT 0 - 35 U/L 16 13 25   Alk Phosphatase 39 - 117 U/L 113 120(H) 158(H)  Total Bilirubin 0.2 - 1.2 mg/dL 0.7 0.6 0.8  Bilirubin, Direct 0.0 - 0.3 mg/dL - 0.2 0.2    Assessment:  #1 Sarcoidosis, stage III. No record of granulomas on lung biopsy but liver biopsy does show granulomas. She is currently on a slow steroid taper with prednisone is at 2.5 mg per day. I have asked her to stop  the prednisone altogether. We will continue to monitor symptoms and lung imaging. She continues on OTC vitamin D supplementation and PCP prophylaxis with Bactrim for now. If she remains off the steroids then we can stop the Bactrim.  #2 Asthmatic bronchitis. Continue Qvar inhaler  #3 Cough Continue Flonase for rhinitis and Protonix for GERD  Plan/Recommendations: - Stop presnisone - Continue Qvar inhaler     Marshell Garfinkel MD [facility] Pager [phone] 01/12/2017, 4:08 PM  CC: Loretta Due MD

## 2017-01-15 ENCOUNTER — Other Ambulatory Visit: Payer: Self-pay | Admitting: Internal Medicine

## 2017-01-15 NOTE — Telephone Encounter (Signed)
Pt is calling because we forgot to call in prescription for her needles. Can we call these in. jw

## 2017-01-19 NOTE — Telephone Encounter (Signed)
LVM for pt to call the office. Spoke to [employer]. Lancets and test strips have refills. They will get them ready for her to pick up. If she needs something else she needs to call the pharmacy and they will send [place] a request. Ottis Stain, CMA

## 2017-01-19 NOTE — Telephone Encounter (Signed)
Please have the pharmacy send the request for the diabetic supplies this patient needs.   Phill Myron, D.O. 01/19/2017, 11:26 AM PGY-3, [place]

## 2017-02-15 ENCOUNTER — Telehealth: Payer: Self-pay | Admitting: Gastroenterology

## 2017-02-15 ENCOUNTER — Other Ambulatory Visit: Payer: Self-pay

## 2017-02-15 ENCOUNTER — Encounter: Payer: Self-pay | Admitting: Internal Medicine

## 2017-02-15 DIAGNOSIS — D869 Sarcoidosis, unspecified: Secondary | ICD-10-CM

## 2017-02-15 NOTE — Telephone Encounter (Signed)
Called patient back and left information on vm, asked her to call back if not better or if she has further questions.

## 2017-02-15 NOTE — Telephone Encounter (Signed)
I have reviewed her chart  I see that in the past she has taken ondansetron but that and other anti-emetics do raise some risk of having heart rhythm problems (she has long QT syndrome) so we should avoid these medications.  Sounds like and I hope she is getting somewhat better.  She should stay on liquids and BRAT diet and see how she does over time -   If not resolving call back

## 2017-02-15 NOTE — Telephone Encounter (Signed)
Routed to DOD, patient of Dr. Havery Moros, patient states that starting last Friday, she has had abdominal cramping, nausea and vomiting x 2 yesterday. She feels the IBgard is not helping. She did have some saltine crackers yesterday and fried potatoes, then got sick.  She has been able to keep liquids down today, has not tried to eat anything. She does state her blood sugars are running at her normal high, which has been that way since starting lactulose. She is out of her zofran.

## 2017-02-16 ENCOUNTER — Encounter: Payer: Self-pay | Admitting: Neurology

## 2017-02-16 ENCOUNTER — Ambulatory Visit (INDEPENDENT_AMBULATORY_CARE_PROVIDER_SITE_OTHER): Payer: Medicaid Other | Admitting: Neurology

## 2017-02-16 VITALS — BP 120/80 | HR 70 | Ht 66.0 in | Wt 177.0 lb

## 2017-02-16 DIAGNOSIS — G40B09 Juvenile myoclonic epilepsy, not intractable, without status epilepticus: Secondary | ICD-10-CM

## 2017-02-16 DIAGNOSIS — G43009 Migraine without aura, not intractable, without status migrainosus: Secondary | ICD-10-CM | POA: Diagnosis not present

## 2017-02-16 MED ORDER — LEVETIRACETAM 500 MG PO TABS: 500.0000 mg | ORAL_TABLET | Freq: Two times a day (BID) | ORAL | 5 refills | Status: DC

## 2017-02-16 MED ORDER — TOPIRAMATE 25 MG PO TABS: ORAL_TABLET | ORAL | 6 refills | Status: DC

## 2017-02-16 MED ORDER — LAMOTRIGINE 150 MG PO TABS: 150.0000 mg | ORAL_TABLET | Freq: Two times a day (BID) | ORAL | 5 refills | Status: DC

## 2017-02-16 NOTE — Patient Instructions (Signed)
1. Continue all your medications 2. Keep a calendar of your headaches 3. Follow-up in 5-6 months, call for any changes  Seizure Precautions: 1. If medication has been prescribed for you to prevent seizures, take it exactly as directed.  Do not stop taking the medicine without talking to your doctor first, even if you have not had a seizure in a long time.   2. Avoid activities in which a seizure would cause danger to yourself or to others.  Don't operate dangerous machinery, swim alone, or climb in high or dangerous places, such as on ladders, roofs, or girders.  Do not drive unless your doctor says you may.  3. If you have any warning that you may have a seizure, lay down in a safe place where you can't hurt yourself.    4.  No driving for 6 months from last seizure, as per [facility].   Please refer to the following link on the [place] website for more information: http://[email or web address].org/[email or web address]/driving/drivingu.cfm   5.  Maintain good sleep hygiene. Avoid alcohol  6.  Notify your neurology if you are planning pregnancy or if you become pregnant.  7.  Contact your doctor if you have any problems that may be related to the medicine you are taking.  8.  Call 911 and bring the patient back to the ED if:        A.  The seizure lasts longer than 5 minutes.       B.  The patient doesn't awaken shortly after the seizure  C.  The patient has new problems such as difficulty seeing, speaking or moving  D.  The patient was injured during the seizure  E.  The patient has a temperature over 102 F (39C)  F.  The patient vomited and now is having trouble breathing

## 2017-02-16 NOTE — Progress Notes (Signed)
NEUROLOGY FOLLOW UP OFFICE NOTE  [name] [id number] Feb 28, 1971  HISTORY OF PRESENT ILLNESS: I had the pleasure of seeing [name] in follow-up in the neurology clinic on 02/16/2017.  The patient was last seen 3 months ago for juvenile myoclonic epilepsy and frequent headaches. Records and images were personally reviewed where available.  I personally reviewed head CT without contrast done 11/11/16 which was normal. Her ophthalmologist's notes were reviewed, no evidence of papilledema. She was started on Topamax last July, currently on 23m qhs. She reports that the headaches are better, she has on average 2 a week, sometimes she would go 2 weeks with only one headache. She has noticed a change in taste with sodas with the Topamax, but otherwise no significant side effects. She denies any convulsions or blackouts, she has not woken up with tongue bite or incontinence. She states she has no more jerks than usual. Her vision is unchanged. She fell down steps 2 weeks ago with no significant injuries.   HPI 11/05/2016: This is a 46year old ambidextrous left-hand dominant woman with a history of sarcoidosis (pulmonary and hepatic), diabetes, ventricular tachycardia s/p AICD placement, long Q-T syndrome, juvenile myoclonic epilepsy, with seizures and headaches. She had been seeing neurologist Dr. SFelecia Shellingand states that she grew up never treated for seizures but from time to time was told she had symptoms that sounded like seizure. She started seeing Neurology in her late 46swhen she had episodes where she would briefly blackout and become confused, not knowing what was going on. She would have quick body jerks but no convulsive activity. She was taking clonazepam at that time but was not on medication when she started seeing Dr. SFelecia Shellingin October 2017. An EEG done 03/20/2016 showed several 3-6 second bursts of generalized 3 Hz polyspike wave activity. There were also some temporal sharp waves seen.  EEG reported as consistent with juvenile myoclonic epilepsy. She was started on Lamotrigine in November 2017, increased to 1022mBID. Clonazepam 0.1m92mID was added in November 2017, but per records it is prn for anxiety. Keppra 500m47mD was added in March 2018 for seizure and headache prophylaxis. She reports last blackout episode was in March 2018. She continues to have myoclonic jerks a few times a week affecting her arms. They can occur anytime during the day. She denies any convulsions but has woken up with her tongue bitten and her bed wet. Last episode of waking up with tongue bite was last week. She reports a history of bad headaches since elementary school, but now she feels her head is full with constant 4/10 pain waxing and waning in severity to 10/10 3-4 times a month. Pain is usually on the left parietal or vertex region, sometimes with pressure or throbbing on the left side. She has nausea and vomiting, occasional photo and phonophobia. She takes Tylenol on occasion, a small bottle lasts her a month, but recently she has not been taking anything. Occipital nerve block did not help in the past. She usually gets 3-4 hours of sleep due to chronic neck and back pain. She reports her mind is always wandering. She has a history of fibromyalgia and her body is always sore. She has diabetic neuropathy, and reports that there is always some tingling or numbness somewhere in her arms and legs. She has occasional constipation. She reports a fall last Wednesday. She denies any significant head injuries. She has been taking gabapentin for neuropathy for the past 4-5 years.  She has occasional anxiety. She feels like her left leg drags more. She had seen ophthalmologist Dr. Tama High and was told there was "increased pressure," records will be requested for review. She denies any olfactory/gustatory hallucinations, deja vu, rising epigastric sensation. Her mother had migraines, maternal grandfather had  migraines.   Epilepsy Risk Factors:  There is a strong family history of seizures in her maternal grandfather, maternal uncles. She had a normal birth and early development.  There is no history of febrile convulsions, CNS infections such as meningitis/encephalitis, significant traumatic brain injury, neurosurgical procedures.   I personally reviewed head CT without contrast done 11/2014 which did not show any acute changes.   PAST MEDICAL HISTORY: Past Medical History:  Diagnosis Date  . Abdominal pain, periumbilic   . Allergic rhinitis   . Arthritis   . Asthma   . Automatic implantable cardioverter-defibrillator in situ 2006, replaced 2008   [employer]  . Bronchitis   . Cancer ([place])    Nodules in lungs pt believes were cancerous pt unsure  . DM (diabetes mellitus) ([place]) 1997/1998   type 1, initially gestational but soon after child birth developed IDDM  . Esophageal stricture 2005/2009   esophageal strictures dilated 2005, 2009  . Eye hemorrhage    bilateral  . Fibroid, uterine   . Fibromyalgia   . GERD (gastroesophageal reflux disease) 2005  . History of blood transfusion years ago  . Internal hemorrhoids 2010   2011 band ligation of int rrhoids.  . Long Q-T syndrome   . Neuropathy in diabetes ([place])   . Palpitation   . Sarcoidosis   . Seizure disorder ([place])    none recently  . Stroke ([place])    QUESTIONABLE TIA     MEDICATIONS: Current Outpatient Prescriptions on File Prior to Visit  Medication Sig Dispense Refill  . ACCU-CHEK SOFTCLIX LANCETS lancets Use as instructed 100 each 12  . acetaminophen (TYLENOL) 500 MG tablet Take 1,000 mg by mouth every 6 (six) hours as needed for mild pain or fever. Reported on 07/22/2015    . beclomethasone (QVAR) 80 MCG/ACT inhaler Inhale 2 puffs into the lungs 2 (two) times daily. 1 Inhaler 5  . Blood Glucose Monitoring Suppl (ACCU-CHEK AVIVA PLUS) w/Device KIT 1 Device by Does not apply route daily. 1 kit 0  . clonazePAM (KLONOPIN) 0.5  MG tablet Take 1 tablet (0.5 mg total) by mouth 2 (two) times daily as needed for anxiety. 60 tablet 3  . fluticasone (FLONASE) 50 MCG/ACT nasal spray Place 2 sprays into both nostrils daily as needed for allergies.     Marland Kitchen gabapentin (NEURONTIN) 300 MG capsule Take 1 capsule (300 mg total) by mouth 3 (three) times daily. 90 capsule 2  . glucose blood (ACCU-CHEK AVIVA PLUS) test strip Use as instructed 100 each 12  . Insulin Glargine (LANTUS SOLOSTAR) 100 UNIT/ML Solostar Pen Inject 40 Units into the skin at bedtime. 30 mL 5  . lactulose (CHRONULAC) 10 GM/15ML solution Take 30 ml TID 2700 mL 2  . lamoTRIgine (LAMICTAL) 150 MG tablet Take 1 tablet (150 mg total) by mouth 2 (two) times daily. 60 tablet 5  . Lancet Devices (ACCU-CHEK SOFTCLIX) lancets Use as instructed 1 each 0  . levETIRAcetam (KEPPRA) 500 MG tablet Take 1 tablet (500 mg total) by mouth 2 (two) times daily. 60 tablet 5  . Linaclotide (LINZESS) 145 MCG CAPS capsule Take 1 capsule (145 mcg total) by mouth daily. 30 capsule 3  . meclizine (ANTIVERT) 32 MG  tablet Take 1 tablet (32 mg total) by mouth 3 (three) times daily as needed. 30 tablet 0  . naproxen (NAPROSYN) 375 MG tablet Take 1 tablet (375 mg total) by mouth 2 (two) times daily with a meal. 60 tablet 3  . NOVOLOG FLEXPEN 100 UNIT/ML FlexPen Inject 4-8 Units into the skin 3 (three) times daily. 15 mL 0  . pantoprazole (PROTONIX) 40 MG tablet Take 1 tablet (40 mg total) by mouth daily at 6 (six) AM. 30 tablet 2  . potassium chloride SA (K-DUR,KLOR-CON) 20 MEQ tablet take 1 tablet by mouth once daily 30 tablet 2  . predniSONE (DELTASONE) 5 MG tablet Take by mouth daily with breakfast. 7.'5mg'$  daily    . propranolol (INDERAL) 80 MG tablet Take 1 tablet (80 mg total) by mouth 2 (two) times daily. 30 tablet 11  . sulfamethoxazole-trimethoprim (BACTRIM DS) 800-160 MG tablet Take 1 tablet by mouth 3 (three) times a week. 12 tablet 5  . topiramate (TOPAMAX) 25 MG tablet Take 1 tablet at  night for 1 week, then increase to 2 tabs at night 60 tablet 6   No current facility-administered medications on file prior to visit.     ALLERGIES: Allergies  Allergen Reactions  . Contrast Media [Iodinated Diagnostic Agents] Anaphylaxis and Shortness Of Breath    Needed to be defibrillated and patient was pre-medicated with radiology 13 hour premeds.   . Augmentin [Amoxicillin-Pot Clavulanate] Other (See Comments)    "stomach hurt"  . Codeine Nausea And Vomiting    jittery  . Demerol [Meperidine]   . Iohexol      Code: RASH, Desc: PT STATES SHE IS ALLERGIC TO IV CONTRAST 05/28/06/RM, Onset Date: [id number]   . Morphine And Related Nausea And Vomiting    FAMILY HISTORY: Family History  Problem Relation Age of Onset  . Heart attack Mother   . Lung cancer Maternal Uncle        x2  . Breast cancer Maternal Aunt        x2  . Stomach cancer Paternal Uncle   . Stomach cancer Paternal Grandfather   . Stomach cancer Paternal Grandmother   . Diabetes Father   . Positive PPD/TB Exposure Father   . Glaucoma Father   . Colon cancer Neg Hx     SOCIAL HISTORY: Social History   Social History  . Marital status: Single    Spouse name: N/A  . Number of children: N/A  . Years of education: N/A   Occupational History  . Not on file.   Social History Main Topics  . Smoking status: Never Smoker  . Smokeless tobacco: Never Used  . Alcohol use 0.0 oz/week     Comment: occ.  . Drug use: No  . Sexual activity: No   Other Topics Concern  . Not on file   Social History Narrative  . No narrative on file    REVIEW OF SYSTEMS: Constitutional: No fevers, chills, or sweats, no generalized fatigue, change in appetite Eyes: No visual changes, double vision, eye pain Ear, nose and throat: No hearing loss, ear pain, nasal congestion, sore throat Cardiovascular: No chest pain, palpitations Respiratory:  No shortness of breath at rest or with exertion, wheezes GastrointestinaI: No  nausea, vomiting, diarrhea, abdominal pain, fecal incontinence Genitourinary:  No dysuria, urinary retention or frequency Musculoskeletal:  + neck pain, back pain Integumentary: No rash, pruritus, skin lesions Neurological: as above Psychiatric: No depression, insomnia, anxiety Endocrine: No palpitations, fatigue, diaphoresis, mood swings, change in  appetite, change in weight, increased thirst Hematologic/Lymphatic:  No anemia, purpura, petechiae. Allergic/Immunologic: no itchy/runny eyes, nasal congestion, recent allergic reactions, rashes  PHYSICAL EXAM: Vitals:   02/16/17 1200  BP: 120/80  Pulse: 70  SpO2: 97%   General: No acute distress Head:  Normocephalic/atraumatic Neck: supple, no paraspinal tenderness, full range of motion Heart:  Regular rate and rhythm Lungs:  Clear to auscultation bilaterally Back: No paraspinal tenderness Skin/Extremities: No rash, no edema Neurological Exam: alert and oriented to person, place, and time. No aphasia or dysarthria. Fund of knowledge is appropriate.  Recent and remote memory are intact.  Attention and concentration are normal.    Able to name objects and repeat phrases. Cranial nerves: Pupils equal, round, reactive to light.  Fundoscopic exam unremarkable, no papilledema. Extraocular movements intact with no nystagmus. Visual fields full. Facial sensation intact. No facial asymmetry. Tongue, uvula, palate midline.  Motor: Bulk and tone normal, muscle strength 5/5 throughout with no pronator drift.  Sensation to light touch intact.  No extinction to double simultaneous stimulation.  Deep tendon reflexes 2+ throughout, toes downgoing.  Finger to nose testing intact.  Gait narrow-based and steady, able to tandem walk adequately.  Romberg negative.  IMPRESSION: This is a 46 year old ambidextrous left-hand dominant woman with a history of sarcoidosis (pulmonary and hepatic), diabetes, ventricular tachycardia s/p AICD placement, long Q-T syndrome,  with juvenile myoclonic epilepsy and migraines. She reports episodes of blacking out and myoclonic jerks, EEG done at Dr. Garth Bigness office reported generalized 3 Hz polyspike wave discharges consistent with a generalized epilepsy. She is on Lamotrigine 175m BID and Keppra 5032mBID and continued to report myoclonic jerks. Last blackout was in March 2018. Her main concern were the migraines, they have improved significantly with low dose Topamax 5043mhs. She wants to stay on current dose for now. Refills were sent in for her medications. She will keep a calendar of her headaches, we may further uptitrate Topamax in the future if needed, and potentially be able to taper Keppra to minimize polypharmacy. Gabapentin may worsen seizures in patients with primary generalized epilepsy, continue to monitor for need in the future. She knows to minimize Tylenol intake to 2-2 a week to avoid rebound headaches. She is aware of [place] driving laws to stop driving after a seizure, until 6 months seizure-free. She will follow-up in 5-6 months and knows to call for any changes  Thank you for allowing me to participate in her care.  Please do not hesitate to call for any questions or concerns.  The duration of this appointment visit was 25 minutes of face-to-face time with the patient.  Greater than 50% of this time was spent in counseling, explanation of diagnosis, planning of further management, and coordination of care.   KarEllouise Newer.D.   CC: Dr. WalJuleen China

## 2017-03-03 ENCOUNTER — Other Ambulatory Visit (HOSPITAL_COMMUNITY)
Admission: RE | Admit: 2017-03-03 | Discharge: 2017-03-03 | Disposition: A | Discharge status: Home / Self Care | Payer: Medicaid Other | Source: Ambulatory Visit | Attending: Obstetrics | Admitting: Obstetrics

## 2017-03-03 ENCOUNTER — Encounter: Payer: Self-pay | Admitting: Obstetrics

## 2017-03-03 ENCOUNTER — Ambulatory Visit (INDEPENDENT_AMBULATORY_CARE_PROVIDER_SITE_OTHER): Payer: Medicaid Other | Admitting: Obstetrics

## 2017-03-03 VITALS — BP 159/91 | HR 79 | Ht 66.0 in | Wt 181.4 lb

## 2017-03-03 DIAGNOSIS — B3731 Acute candidiasis of vulva and vagina: Secondary | ICD-10-CM

## 2017-03-03 DIAGNOSIS — M1990 Unspecified osteoarthritis, unspecified site: Secondary | ICD-10-CM | POA: Insufficient documentation

## 2017-03-03 DIAGNOSIS — K219 Gastro-esophageal reflux disease without esophagitis: Secondary | ICD-10-CM | POA: Insufficient documentation

## 2017-03-03 DIAGNOSIS — M797 Fibromyalgia: Secondary | ICD-10-CM | POA: Diagnosis not present

## 2017-03-03 DIAGNOSIS — B373 Candidiasis of vulva and vagina: Secondary | ICD-10-CM | POA: Insufficient documentation

## 2017-03-03 DIAGNOSIS — Z1239 Encounter for other screening for malignant neoplasm of breast: Secondary | ICD-10-CM

## 2017-03-03 DIAGNOSIS — Z01411 Encounter for gynecological examination (general) (routine) with abnormal findings: Secondary | ICD-10-CM | POA: Diagnosis not present

## 2017-03-03 DIAGNOSIS — J45909 Unspecified asthma, uncomplicated: Secondary | ICD-10-CM | POA: Insufficient documentation

## 2017-03-03 DIAGNOSIS — Z113 Encounter for screening for infections with a predominantly sexual mode of transmission: Secondary | ICD-10-CM | POA: Diagnosis not present

## 2017-03-03 DIAGNOSIS — Z833 Family history of diabetes mellitus: Secondary | ICD-10-CM | POA: Insufficient documentation

## 2017-03-03 DIAGNOSIS — E1040 Type 1 diabetes mellitus with diabetic neuropathy, unspecified: Secondary | ICD-10-CM | POA: Insufficient documentation

## 2017-03-03 DIAGNOSIS — G40909 Epilepsy, unspecified, not intractable, without status epilepticus: Secondary | ICD-10-CM | POA: Diagnosis not present

## 2017-03-03 DIAGNOSIS — Z885 Allergy status to narcotic agent status: Secondary | ICD-10-CM | POA: Diagnosis not present

## 2017-03-03 DIAGNOSIS — Z01419 Encounter for gynecological examination (general) (routine) without abnormal findings: Secondary | ICD-10-CM | POA: Diagnosis not present

## 2017-03-03 DIAGNOSIS — R1032 Left lower quadrant pain: Secondary | ICD-10-CM

## 2017-03-03 DIAGNOSIS — Z882 Allergy status to sulfonamides status: Secondary | ICD-10-CM | POA: Diagnosis not present

## 2017-03-03 DIAGNOSIS — Z7689 Persons encountering health services in other specified circumstances: Secondary | ICD-10-CM

## 2017-03-03 DIAGNOSIS — Z91041 Radiographic dye allergy status: Secondary | ICD-10-CM | POA: Diagnosis not present

## 2017-03-03 DIAGNOSIS — D869 Sarcoidosis, unspecified: Secondary | ICD-10-CM | POA: Diagnosis not present

## 2017-03-03 DIAGNOSIS — Z79899 Other long term (current) drug therapy: Secondary | ICD-10-CM | POA: Insufficient documentation

## 2017-03-03 DIAGNOSIS — Z95810 Presence of automatic (implantable) cardiac defibrillator: Secondary | ICD-10-CM | POA: Diagnosis not present

## 2017-03-03 DIAGNOSIS — Z0000 Encounter for general adult medical examination without abnormal findings: Secondary | ICD-10-CM

## 2017-03-03 DIAGNOSIS — Z794 Long term (current) use of insulin: Secondary | ICD-10-CM | POA: Insufficient documentation

## 2017-03-03 LAB — POCT URINALYSIS DIPSTICK
BILIRUBIN UA: NEGATIVE
Blood, UA: NEGATIVE
Glucose, UA: 2
KETONES UA: 5
Nitrite, UA: NEGATIVE
PH UA: 6 (ref 5.0–8.0)
Spec Grav, UA: 1.015 (ref 1.010–1.025)
Urobilinogen, UA: 0.2 E.U./dL

## 2017-03-03 MED ORDER — VITAFOL PO TABS: 1.0000 | ORAL_TABLET | Freq: Every day | ORAL | 11 refills | Status: DC

## 2017-03-03 MED ORDER — FLUCONAZOLE 200 MG PO TABS: 200.0000 mg | ORAL_TABLET | ORAL | 0 refills | Status: DC

## 2017-03-03 NOTE — Progress Notes (Signed)
Subjective:        [name] is a 46 y.o. female here for a routine exam.  Current complaints: LLQ pain.  S/P TAH / BS.Marland Kitchen    Personal health questionnaire:  Is patient Ashkenazi Jewish, have a family history of breast and/or ovarian cancer: no Is there a family history of uterine cancer diagnosed at age < 66, gastrointestinal cancer, urinary tract cancer, family member who is a Field seismologist syndrome-associated carrier: no Is the patient overweight and hypertensive, family history of diabetes, personal history of gestational diabetes, preeclampsia or PCOS: no Is patient over 57, have PCOS,  family history of premature CHD under age 43, diabetes, smoke, have hypertension or peripheral artery disease:  no At any time, has a partner hit, kicked or otherwise hurt or frightened you?: no Over the past 2 weeks, have you felt down, depressed or hopeless?: no Over the past 2 weeks, have you felt little interest or pleasure in doing things?:no   Gynecologic History Patient's last menstrual period was 07/23/2014. Contraception: status post hysterectomy Last Pap: 2015. Results were: normal Last mammogram: 2013. Results were: normal  Obstetric History OB History  Gravida Para Term Preterm AB Living  _0 SAB TAB Ectopic Multiple Live Births  2       4    # Outcome Date GA Lbr Len/2nd Weight Sex Delivery Anes PTL Lv  6 Term 1998        LIV  5 Term 1996        LIV  4 SAB 1995          3 Term 1993        LIV  2 SAB 1992          1 Term 1990        DEC      Past Medical History:  Diagnosis Date  . Abdominal pain, periumbilic   . Allergic rhinitis   . Arthritis   . Asthma   . Automatic implantable cardioverter-defibrillator in situ 2006, replaced 2008   MEDTRONIC  . Bronchitis   . Cancer ([place])    Nodules in lungs pt believes were cancerous pt unsure  . DM (diabetes mellitus) ([place]) 1997/1998   type 1, initially gestational but soon after child birth developed IDDM  .  Esophageal stricture 2005/2009   esophageal strictures dilated 2005, 2009  . Eye hemorrhage    bilateral  . Fibroid, uterine   . Fibromyalgia   . GERD (gastroesophageal reflux disease) 2005  . History of blood transfusion years ago  . Internal hemorrhoids 2010   2011 band ligation of int rrhoids.  . Long Q-T syndrome   . Neuropathy in diabetes ([place])   . Palpitation   . Sarcoidosis   . Seizure disorder ([place])    none recently  . Stroke ([place])    QUESTIONABLE TIA     Past Surgical History:  Procedure Laterality Date  . AUTOMATIC IMPLANTABLE CARDIAC DEFIBRILLATOR SITU  2006,    ICD-Medtronic   Remote - Yes   . CERVIX REMOVAL    . DILITATION & CURRETTAGE/HYSTROSCOPY WITH HYDROTHERMAL ABLATION N/A 06/07/2013   Procedure: DILATATION & CURETTAGE/HYSTEROSCOPY WITH attempted HYDROTHERMAL ABLATION;  Surgeon: Frederico Hamman, MD;  Location: [facility];  Service: Gynecology;  Laterality: N/A;  . EP IMPLANTABLE DEVICE N/A 04/04/2015   Procedure:  ICD Generator Changeout;  Surgeon: Evans Lance, MD;  Location: [facility];  Service: Cardiovascular;  Laterality:  N/A;  . ESOPHAGOGASTRODUODENOSCOPY (EGD) WITH PROPOFOL N/A 10/24/2014   Procedure: ESOPHAGOGASTRODUODENOSCOPY (EGD) WITH PROPOFOL;  Surgeon: Inda Castle, MD;  Location: [facility];  Service: Endoscopy;  Laterality: N/A;  . EXPLORATORY LAPAROTOMY  11/06/14   evacuation of post TAH/BSO hematoma  . HAND SURGERY Right    x 2  . HEMORRHOID BANDING  03/2010  . SALPINGECTOMY    . SVT ablation    . TOTAL ABDOMINAL HYSTERECTOMY W/ BILATERAL SALPINGOOPHORECTOMY  11/06/14   at [facility]. for menorrhagia, pelvic pain and enlarging uterine fibroids  . TUBAL LIGATION    . VIDEO BRONCHOSCOPY Bilateral 01/24/2015   Procedure: VIDEO BRONCHOSCOPY WITHOUT FLUORO;  Surgeon: Marshell Garfinkel, MD;  Location: [facility];  Service: Cardiopulmonary;  Laterality: Bilateral;     Current Outpatient Prescriptions:  .  ACCU-CHEK SOFTCLIX LANCETS lancets, Use  as instructed, Disp: 100 each, Rfl: 12 .  acetaminophen (TYLENOL) 500 MG tablet, Take 1,000 mg by mouth every 6 (six) hours as needed for mild pain or fever. Reported on 07/22/2015, Disp: , Rfl:  .  beclomethasone (QVAR) 80 MCG/ACT inhaler, Inhale 2 puffs into the lungs 2 (two) times daily., Disp: 1 Inhaler, Rfl: 5 .  Blood Glucose Monitoring Suppl (ACCU-CHEK AVIVA PLUS) w/Device KIT, 1 Device by Does not apply route daily., Disp: 1 kit, Rfl: 0 .  clonazePAM (KLONOPIN) 0.5 MG tablet, Take 1 tablet (0.5 mg total) by mouth 2 (two) times daily as needed for anxiety., Disp: 60 tablet, Rfl: 3 .  fluticasone (FLONASE) 50 MCG/ACT nasal spray, Place 2 sprays into both nostrils daily as needed for allergies. , Disp: , Rfl:  .  gabapentin (NEURONTIN) 300 MG capsule, Take 1 capsule (300 mg total) by mouth 3 (three) times daily., Disp: 90 capsule, Rfl: 2 .  glucose blood (ACCU-CHEK AVIVA PLUS) test strip, Use as instructed, Disp: 100 each, Rfl: 12 .  Insulin Glargine (LANTUS SOLOSTAR) 100 UNIT/ML Solostar Pen, Inject 40 Units into the skin at bedtime., Disp: 30 mL, Rfl: 5 .  lactulose (CHRONULAC) 10 GM/15ML solution, Take 30 ml TID, Disp: 2700 mL, Rfl: 2 .  lamoTRIgine (LAMICTAL) 150 MG tablet, Take 1 tablet (150 mg total) by mouth 2 (two) times daily., Disp: 60 tablet, Rfl: 5 .  Lancet Devices (ACCU-CHEK SOFTCLIX) lancets, Use as instructed, Disp: 1 each, Rfl: 0 .  levETIRAcetam (KEPPRA) 500 MG tablet, Take 1 tablet (500 mg total) by mouth 2 (two) times daily., Disp: 60 tablet, Rfl: 5 .  Linaclotide (LINZESS) 145 MCG CAPS capsule, Take 1 capsule (145 mcg total) by mouth daily., Disp: 30 capsule, Rfl: 3 .  meclizine (ANTIVERT) 32 MG tablet, Take 1 tablet (32 mg total) by mouth 3 (three) times daily as needed., Disp: 30 tablet, Rfl: 0 .  naproxen (NAPROSYN) 375 MG tablet, Take 1 tablet (375 mg total) by mouth 2 (two) times daily with a meal., Disp: 60 tablet, Rfl: 3 .  NOVOLOG FLEXPEN 100 UNIT/ML FlexPen, Inject  4-8 Units into the skin 3 (three) times daily., Disp: 15 mL, Rfl: 0 .  pantoprazole (PROTONIX) 40 MG tablet, Take 1 tablet (40 mg total) by mouth daily at 6 (six) AM., Disp: 30 tablet, Rfl: 2 .  potassium chloride SA (K-DUR,KLOR-CON) 20 MEQ tablet, take 1 tablet by mouth once daily, Disp: 30 tablet, Rfl: 2 .  predniSONE (DELTASONE) 5 MG tablet, Take by mouth daily with breakfast. 7.56m daily, Disp: , Rfl:  .  propranolol (INDERAL) 80 MG tablet, Take 1 tablet (80 mg total) by mouth  2 (two) times daily., Disp: 30 tablet, Rfl: 11 .  topiramate (TOPAMAX) 25 MG tablet, Take 2 tabs at night, Disp: 60 tablet, Rfl: 6 .  fluconazole (DIFLUCAN) 200 MG tablet, Take 1 tablet (200 mg total) by mouth every other day., Disp: 3 tablet, Rfl: 0 .  Iron-Vitamins (VITAFOL) TABS, Take 1 tablet by mouth daily before breakfast., Disp: 1 tablet, Rfl: 11 Allergies  Allergen Reactions  . Contrast Media [Iodinated Diagnostic Agents] Anaphylaxis and Shortness Of Breath    Needed to be defibrillated and patient was pre-medicated with radiology 13 hour premeds.   . Augmentin [Amoxicillin-Pot Clavulanate] Other (See Comments)    "stomach hurt"  . Codeine Nausea And Vomiting    jittery  . Demerol [Meperidine]   . Iohexol      Code: RASH, Desc: PT STATES SHE IS ALLERGIC TO IV CONTRAST 05/28/06/RM, Onset Date: [id number]   . Morphine And Related Nausea And Vomiting    Social History  Substance Use Topics  . Smoking status: Never Smoker  . Smokeless tobacco: Never Used  . Alcohol use 0.0 oz/week     Comment: None since 2015-was occasional    Family History  Problem Relation Age of Onset  . Heart attack Mother   . Lung cancer Maternal Uncle        x2  . Breast cancer Maternal Aunt        x2  . Stomach cancer Paternal Uncle   . Stomach cancer Paternal Grandfather   . Stomach cancer Paternal Grandmother   . Diabetes Father   . Positive PPD/TB Exposure Father   . Glaucoma Father   . Colon cancer Neg Hx        Review of Systems  Constitutional: negative for fatigue and weight loss Respiratory: negative for cough and wheezing Cardiovascular: negative for chest pain, fatigue and palpitations Gastrointestinal: negative for abdominal pain and change in bowel habits Musculoskeletal:negative for myalgias Neurological: negative for gait problems and tremors Behavioral/Psych: negative for abusive relationship, depression Endocrine: negative for temperature intolerance    Genitourinary:negative for abnormal menstrual periods, genital lesions, hot flashes, sexual problems and vaginal discharge Integument/breast: negative for breast lump, breast tenderness, nipple discharge and skin lesion(s)    Objective:       BP (!) 159/91   Pulse 79   Ht _0  (1.676 m)   Wt 181 lb 6.4 oz (82.3 kg)   LMP 07/23/2014   BMI 29.28 kg/m  General:   alert  Skin:   no rash or abnormalities  Lungs:   clear to auscultation bilaterally  Heart:   regular rate and rhythm, S1, S2 normal, no murmur, click, rub or gallop  Breasts:   normal without suspicious masses, skin or nipple changes or axillary nodes  Abdomen:  normal findings: no organomegaly, soft, non-tender and no hernia  Pelvis:  External genitalia: normal general appearance Urinary system: urethral meatus normal and bladder without fullness, nontender Vaginal: normal without tenderness, induration or masses Cervix: normal appearance Adnexa: normal bimanual exam Uterus: anteverted and non-tender, normal size   Lab Review Urine pregnancy test Labs reviewed yes Radiologic studies reviewed yes  50% of 20 min visit spent on counseling and coordination of care.    Assessment and Plan:     1. Well woman exam with routine gynecological exam Rx: - Iron-Vitamins (VITAFOL) TABS; Take 1 tablet by mouth daily before breakfast.  Dispense: 1 tablet; Refill: 11  2. Encounter for assessment of sexually transmitted disease exposure Rx: - Cervicovaginal  ancillary only - Hepatitis B surface antigen - Hepatitis C antibody - RPR  3. Left lower quadrant pain Rx: - POCT urinalysis dipstick - Urine Culture - US PELVIC COMPLETE WITH TRANSVAGINAL; Future  4. Candida vaginitis Rx: - fluconazole (DIFLUCAN) 200 MG tablet; Take 1 tablet (200 mg total) by mouth every other day.  Dispense: 3 tablet; Refill: 0  5. Screening breast examination Rx: - MM Digital Screening; Future   Plan:    Education reviewed: calcium supplements, depression evaluation, low fat, low cholesterol diet, safe sex/STD prevention, self breast exams and weight bearing exercise. Contraception: status post hysterectomy. Mammogram ordered. Follow up in: 1 year.   Meds ordered this encounter  Medications  . fluconazole (DIFLUCAN) 200 MG tablet    Sig: Take 1 tablet (200 mg total) by mouth every other day.    Dispense:  3 tablet    Refill:  0  . Iron-Vitamins (VITAFOL) TABS    Sig: Take 1 tablet by mouth daily before breakfast.    Dispense:  1 tablet    Refill:  11   Orders Placed This Encounter  Procedures  . Urine Culture  . US PELVIC COMPLETE WITH TRANSVAGINAL    Standing Status:   Future    Standing Expiration Date:   05/03/2018    Order Specific Question:   Reason for Exam (SYMPTOM  OR DIAGNOSIS REQUIRED)    Answer:   Pelvic pain - LLQ.    Order Specific Question:   Preferred imaging location?    Answer:   [facility]  . MM Digital Screening    Standing Status:   Future    Standing Expiration Date:   05/03/2018    Order Specific Question:   Reason for Exam (SYMPTOM  OR DIAGNOSIS REQUIRED)    Answer:   Screening    Order Specific Question:   Is the patient pregnant?    Answer:   No    Order Specific Question:   Preferred imaging location?    Answer:   [facility]  . Hepatitis B surface antigen  . Hepatitis C antibody  . RPR  . POCT urinalysis dipstick

## 2017-03-04 LAB — HEPATITIS C ANTIBODY

## 2017-03-04 LAB — CERVICOVAGINAL ANCILLARY ONLY
Bacterial vaginitis: POSITIVE — AB
CANDIDA VAGINITIS: NEGATIVE
Chlamydia: NEGATIVE
Neisseria Gonorrhea: NEGATIVE
Trichomonas: NEGATIVE

## 2017-03-04 LAB — RPR: RPR: NONREACTIVE

## 2017-03-04 LAB — HEPATITIS B SURFACE ANTIGEN: Hepatitis B Surface Ag: NEGATIVE

## 2017-03-05 ENCOUNTER — Other Ambulatory Visit: Payer: Self-pay | Admitting: Obstetrics

## 2017-03-05 DIAGNOSIS — B9689 Other specified bacterial agents as the cause of diseases classified elsewhere: Secondary | ICD-10-CM

## 2017-03-05 DIAGNOSIS — N760 Acute vaginitis: Principal | ICD-10-CM

## 2017-03-05 LAB — URINE CULTURE

## 2017-03-05 MED ORDER — SECNIDAZOLE 2 G PO PACK: 1.0000 | PACK | Freq: Once | ORAL | 2 refills | Status: AC

## 2017-03-11 ENCOUNTER — Ambulatory Visit (HOSPITAL_COMMUNITY)
Admission: RE | Admit: 2017-03-11 | Discharge: 2017-03-11 | Disposition: A | Discharge status: Home / Self Care | Payer: Medicaid Other | Source: Ambulatory Visit | Attending: Obstetrics | Admitting: Obstetrics

## 2017-03-11 DIAGNOSIS — R1032 Left lower quadrant pain: Secondary | ICD-10-CM | POA: Diagnosis present

## 2017-03-15 ENCOUNTER — Telehealth: Payer: Self-pay | Admitting: Pediatrics

## 2017-03-15 NOTE — Telephone Encounter (Signed)
Pt called requesting RX change on Solosec and Vitafol. She states [place] will not pay for them.  I submitted PA for Solosec. Would you like to change Vitafol or do PA?

## 2017-03-16 MED ORDER — PRENATAL PLUS 27-1 MG PO TABS: 1.0000 | ORAL_TABLET | Freq: Every day | ORAL | 12 refills | Status: DC

## 2017-03-16 NOTE — Telephone Encounter (Signed)
Change Vitafol to Prenatal Plus.

## 2017-03-16 NOTE — Telephone Encounter (Signed)
I called patient and advised rx's should be filled with regular copays at this time. She voiced understanding and agreed with plan.

## 2017-03-17 ENCOUNTER — Telehealth: Payer: Self-pay | Admitting: Pediatrics

## 2017-03-17 ENCOUNTER — Other Ambulatory Visit: Payer: Self-pay | Admitting: Obstetrics

## 2017-03-17 DIAGNOSIS — B9689 Other specified bacterial agents as the cause of diseases classified elsewhere: Secondary | ICD-10-CM

## 2017-03-17 DIAGNOSIS — N760 Acute vaginitis: Principal | ICD-10-CM

## 2017-03-17 MED ORDER — METRONIDAZOLE 500 MG PO TABS: 500.0000 mg | ORAL_TABLET | Freq: Two times a day (BID) | ORAL | 2 refills | Status: DC

## 2017-03-17 NOTE — Telephone Encounter (Signed)
Flagyl Rx for BV 

## 2017-03-17 NOTE — Telephone Encounter (Signed)
PA required for Solosec, will not be covered d/t no previous failed treatment.  OK to change rx to Tindamax or Flagyl?  Pt has extensive medication list. Please advise.

## 2017-03-17 NOTE — Telephone Encounter (Signed)
I spoke to patient and advised of medication change. She voiced understanding and agreed with plan.

## 2017-03-22 ENCOUNTER — Ambulatory Visit (INDEPENDENT_AMBULATORY_CARE_PROVIDER_SITE_OTHER): Payer: Medicaid Other | Admitting: *Deleted

## 2017-03-22 DIAGNOSIS — I4581 Long QT syndrome: Secondary | ICD-10-CM | POA: Diagnosis not present

## 2017-03-22 NOTE — Progress Notes (Signed)
Remote ICD transmission.   

## 2017-03-24 ENCOUNTER — Encounter: Payer: Self-pay | Admitting: Cardiology

## 2017-03-24 LAB — CUP PACEART REMOTE DEVICE CHECK
Brady Statistic RV Percent Paced: 0.01 %
HIGH POWER IMPEDANCE MEASURED VALUE: 50 Ohm
HighPow Impedance: 74 Ohm
Implantable Lead Implant Date: 20080828
Implantable Lead Model: 6947
Implantable Pulse Generator Implant Date: 20161117
Lead Channel Impedance Value: 323 Ohm
Lead Channel Pacing Threshold Amplitude: 1.625 V
Lead Channel Setting Pacing Pulse Width: 0.4 ms
Lead Channel Setting Sensing Sensitivity: 0.3 mV
MDC IDC LEAD LOCATION: 753860
MDC IDC MSMT BATTERY REMAINING LONGEVITY: 124 mo
MDC IDC MSMT BATTERY VOLTAGE: 3.03 V
MDC IDC MSMT LEADCHNL RV IMPEDANCE VALUE: 399 Ohm
MDC IDC MSMT LEADCHNL RV PACING THRESHOLD PULSEWIDTH: 0.4 ms
MDC IDC MSMT LEADCHNL RV SENSING INTR AMPL: 6.5 mV
MDC IDC MSMT LEADCHNL RV SENSING INTR AMPL: 6.5 mV
MDC IDC SESS DTM: 20181105111707
MDC IDC SET LEADCHNL RV PACING AMPLITUDE: 3.25 V

## 2017-03-26 ENCOUNTER — Ambulatory Visit: Payer: Medicaid Other

## 2017-03-31 ENCOUNTER — Encounter: Payer: Self-pay | Admitting: Internal Medicine

## 2017-03-31 ENCOUNTER — Ambulatory Visit: Payer: Medicaid Other | Admitting: Internal Medicine

## 2017-03-31 VITALS — BP 126/88 | HR 77 | Ht 66.0 in | Wt 177.2 lb

## 2017-03-31 DIAGNOSIS — Z95810 Presence of automatic (implantable) cardiac defibrillator: Secondary | ICD-10-CM | POA: Diagnosis not present

## 2017-03-31 DIAGNOSIS — I4581 Long QT syndrome: Secondary | ICD-10-CM | POA: Diagnosis not present

## 2017-03-31 MED ORDER — PROPRANOLOL HCL 80 MG PO TABS: 80.0000 mg | ORAL_TABLET | Freq: Two times a day (BID) | ORAL | 11 refills | Status: DC

## 2017-03-31 NOTE — Patient Instructions (Signed)
Medication Instructions:  Your physician recommends that you continue on your current medications as directed. Please refer to the Current Medication list given to you today.  Labwork: None ordered.  Testing/Procedures: None ordered.  Follow-Up: Your physician wants you to follow-up in: one year with Dr. Lovena Le.   You will receive a reminder letter in the mail two months in advance. If you don't receive a letter, please call our office to schedule the follow-up appointment.  Remote monitoring is used to monitor your ICD from home. This monitoring reduces the number of office visits required to check your device to one time per year. It allows [place] to keep an eye on the functioning of your device to ensure it is working properly. You are scheduled for a device check from home on 06/21/2016. You may send your transmission at any time that day. If you have a wireless device, the transmission will be sent automatically. After your physician reviews your transmission, you will receive a postcard with your next transmission date.    Any Other Special Instructions Will Be Listed Below (If Applicable).     If you need a refill on your cardiac medications before your next appointment, please call your pharmacy.

## 2017-03-31 NOTE — Progress Notes (Signed)
HPI [name] returns today for ongoing evaluation and management of long QT and VF, s/p ICD insertion. She lost over 60 lbs due to sarcoidosis and has been successfully treated and gained back about 40 lbs. She denies chest pain or sob. No syncope.  Allergies  Allergen Reactions  . Contrast Media [Iodinated Diagnostic Agents] Anaphylaxis and Shortness Of Breath    Needed to be defibrillated and patient was pre-medicated with radiology 13 hour premeds.   . Augmentin [Amoxicillin-Pot Clavulanate] Other (See Comments)    "stomach hurt"  . Codeine Nausea And Vomiting    jittery  . Demerol [Meperidine]   . Iohexol      Code: RASH, Desc: PT STATES SHE IS ALLERGIC TO IV CONTRAST 05/28/06/RM, Onset Date: [id number]   . Morphine And Related Nausea And Vomiting     Current Outpatient Medications  Medication Sig Dispense Refill  . ACCU-CHEK SOFTCLIX LANCETS lancets Use as instructed 100 each 12  . acetaminophen (TYLENOL) 500 MG tablet Take 1,000 mg by mouth every 6 (six) hours as needed for mild pain or fever. Reported on 07/22/2015    . beclomethasone (QVAR) 80 MCG/ACT inhaler Inhale 2 puffs into the lungs 2 (two) times daily. 1 Inhaler 5  . Blood Glucose Monitoring Suppl (ACCU-CHEK AVIVA PLUS) w/Device KIT 1 Device by Does not apply route daily. 1 kit 0  . clonazePAM (KLONOPIN) 0.5 MG tablet Take 1 tablet (0.5 mg total) by mouth 2 (two) times daily as needed for anxiety. 60 tablet 3  . cyclobenzaprine (FLEXERIL) 10 MG tablet Take 10 mg as needed by mouth.    . cycloSPORINE (RESTASIS) 0.05 % ophthalmic emulsion Place 1 drop daily into both eyes.    . fluconazole (DIFLUCAN) 200 MG tablet Take 1 tablet (200 mg total) by mouth every other day. 3 tablet 0  . fluticasone (FLONASE) 50 MCG/ACT nasal spray Place 2 sprays into both nostrils daily as needed for allergies.     Marland Kitchen gabapentin (NEURONTIN) 300 MG capsule Take 1 capsule (300 mg total) by mouth 3 (three) times daily. 90 capsule 2  .  glucose blood (ACCU-CHEK AVIVA PLUS) test strip Use as instructed 100 each 12  . Insulin Glargine (LANTUS SOLOSTAR) 100 UNIT/ML Solostar Pen Inject 40 Units into the skin at bedtime. 30 mL 5  . lactulose (CHRONULAC) 10 GM/15ML solution Take 30 ml TID 2700 mL 2  . lamoTRIgine (LAMICTAL) 150 MG tablet Take 1 tablet (150 mg total) by mouth 2 (two) times daily. 60 tablet 5  . Lancet Devices (ACCU-CHEK SOFTCLIX) lancets Use as instructed 1 each 0  . levETIRAcetam (KEPPRA) 500 MG tablet Take 1 tablet (500 mg total) by mouth 2 (two) times daily. 60 tablet 5  . Linaclotide (LINZESS) 145 MCG CAPS capsule Take 1 capsule (145 mcg total) by mouth daily. 30 capsule 3  . lubiprostone (AMITIZA) 8 MCG capsule Take 8 mcg as needed by mouth.    . meclizine (ANTIVERT) 32 MG tablet Take 1 tablet (32 mg total) by mouth 3 (three) times daily as needed. 30 tablet 0  . naproxen (NAPROSYN) 375 MG tablet Take 1 tablet (375 mg total) by mouth 2 (two) times daily with a meal. 60 tablet 3  . NOVOLOG FLEXPEN 100 UNIT/ML FlexPen Inject 4-8 Units into the skin 3 (three) times daily. 15 mL 0  . Olopatadine HCl 0.2 % SOLN Place 1 drop as needed into both eyes.    Marland Kitchen ondansetron (ZOFRAN) 4 MG tablet Take 4  mg as needed by mouth.    . pantoprazole (PROTONIX) 40 MG tablet Take 1 tablet (40 mg total) by mouth daily at 6 (six) AM. 30 tablet 2  . potassium chloride SA (K-DUR,KLOR-CON) 20 MEQ tablet take 1 tablet by mouth once daily 30 tablet 2  . predniSONE (DELTASONE) 5 MG tablet Take by mouth daily with breakfast. 7.58m daily    . prenatal vitamin w/FE, FA (PRENATAL 1 + 1) 27-1 MG TABS tablet Take 1 tablet by mouth daily at 12 noon. 30 each 12  . propranolol (INDERAL) 80 MG tablet Take 1 tablet (80 mg total) 2 (two) times daily by mouth. 30 tablet 11  . topiramate (TOPAMAX) 25 MG tablet Take 2 tabs at night 60 tablet 6   No current facility-administered medications for this visit.      Past Medical History:  Diagnosis Date  .  Abdominal pain, periumbilic   . Allergic rhinitis   . Arthritis   . Asthma   . Automatic implantable cardioverter-defibrillator in situ 2006, replaced 2008   MEDTRONIC  . Bronchitis   . Cancer ([place]    Nodules in lungs pt believes were cancerous pt unsure  . [place] 1997[place]   type 1, initially gestational but soon after child birth developed IDDM  . Esophageal stricture 2005/2009   esophageal strictures dilated 2005, 2009  . Eye hemorrhage    bilateral  . Fibroid, uterine   . Fibromyalgia   . GERD (gastroesophageal reflux disease) 2005  . History of blood transfusion years ago  . Internal hemorrhoids 2010   2011 band ligation of int rrhoids.  . Long Q-T syndrome   . [place]uropathy in diabetes ([place]   . Palpitation   . Sarcoidosis   . [place] ([place]    none recently  . [place]    QUESTIONABLE TIA     ROS:   All systems reviewed and negative except as noted in the HPI.   Past Surgical History:  Procedure Laterality Date  . AUTOMATIC IMPLANTABLE CARDIAC DEFIBRILLATOR SITU  2006,    ICD-Medtronic   Remote - Yes   . CERVIX REMOVAL    . EXPLORATORY LAPAROTOMY  11/06/14   evacuation of post TAH/BSO hematoma  . HAND SURGERY Right    x 2  . HEMORRHOID BANDING  03/2010  . SALPINGECTOMY    . SVT ablation    . TOTAL ABDOMINAL HYSTERECTOMY W/ BILATERAL SALPINGOOPHORECTOMY  11[facility]06/14   [facility], pelvic pain and enlarging uterine fibroids  . TUBAL LIGATION       Family History  Problem Relation Age of Onset  . Heart attack Mother   . Lung cancer Maternal Uncle        x2  . Breast cancer Maternal Aunt        x2  . Stomach cancer Paternal Uncle   . Stomach cancer Paternal Grandfather   . Stomach cancer Paternal Grandmother   . Diabetes Father   . Positive PPD/TB Exposure Father   . Glaucoma Father   . Colon cancer Neg Hx      Social History   Socioeconomic History  . Marital status: Single    Spouse name: Not on  file  . Number of children: Not on file  . Years of education: Not on file  . Highest education level: Not on file  Social Needs  . Financial resource strain: Not on file  . Food insecurity - worry: Not on file  .  Food insecurity - inability: Not on file  . Transportation needs - medical: Not on file  . Transportation needs - non-medical: Not on file  Occupational History  . Not on file  Tobacco Use  . Smoking status: Never Smoker  . Smokeless tobacco: Never Used  Substance and Sexual Activity  . Alcohol use: Yes    Alcohol/week: 0.0 oz    Comment: None since 2015-was occasional  . Drug use: No  . Sexual activity: Yes    Birth control/protection: Condom  Other Topics Concern  . Not on file  Social History Narrative  . Not on file     BP 126/88   Pulse 77   Ht 5' 6" (1.676 m)   Wt 177 lb 3.2 oz (80.4 kg)   LMP 07/23/2014   SpO2 96%   BMI 28.60 kg/m   Physical Exam:  Well appearing NAD HEENT: Unremarkable Neck:  6 cm JVD, no thyromegally Lymphatics:  No adenopathy Back:  No CVA tenderness Lungs:  Clear with no wheezes HEART:  Regular rate rhythm, no murmurs, no rubs, no clicks Abd:  soft, positive bowel sounds, no organomegally, no rebound, no guarding Ext:  2 plus pulses, no edema, no cyanosis, no clubbing Skin:  No rashes no nodules Neuro:  CN II through XII intact, motor grossly intact   DEVICE  Normal device function.  See PaceArt for details.   Assess/Plan: 1. VT/VF - she has had no recurrent arrhythmias. No change in meds. She will continue her propranolol. 2. Long QT - she did not have an ECG today. She will continue her Beta blocker 3. ICD - her Medtronic device is working normally. Will recheck in several months. 4. Sarcoidosis - no evidence of cardiac involvement. She remains on low dose steroids.  Mikle Bosworth.D.

## 2017-04-01 ENCOUNTER — Telehealth: Payer: Self-pay

## 2017-04-01 NOTE — Telephone Encounter (Signed)
Contacted pt to advise of PA approval, no answer, left vm.

## 2017-04-05 ENCOUNTER — Other Ambulatory Visit (INDEPENDENT_AMBULATORY_CARE_PROVIDER_SITE_OTHER): Payer: Medicaid Other

## 2017-04-05 ENCOUNTER — Other Ambulatory Visit: Payer: Self-pay

## 2017-04-05 ENCOUNTER — Telehealth: Payer: Self-pay | Admitting: Gastroenterology

## 2017-04-05 DIAGNOSIS — K625 Hemorrhage of anus and rectum: Secondary | ICD-10-CM

## 2017-04-05 LAB — CBC WITH DIFFERENTIAL/PLATELET
BASOS ABS: 0.1 10*3/uL (ref 0.0–0.1)
Basophils Relative: 1.2 % (ref 0.0–3.0)
Eosinophils Absolute: 0.1 10*3/uL (ref 0.0–0.7)
Eosinophils Relative: 1.7 % (ref 0.0–5.0)
HCT: 42.9 % (ref 36.0–46.0)
Hemoglobin: 14.8 g/dL (ref 12.0–15.0)
LYMPHS ABS: 3.6 10*3/uL (ref 0.7–4.0)
Lymphocytes Relative: 50.3 % — ABNORMAL HIGH (ref 12.0–46.0)
MCHC: 34.4 g/dL (ref 30.0–36.0)
MCV: 86.9 fl (ref 78.0–100.0)
MONO ABS: 0.6 10*3/uL (ref 0.1–1.0)
MONOS PCT: 7.7 % (ref 3.0–12.0)
NEUTROS PCT: 39.1 % — AB (ref 43.0–77.0)
Neutro Abs: 2.8 10*3/uL (ref 1.4–7.7)
Platelets: 249 10*3/uL (ref 150.0–400.0)
RBC: 4.94 Mil/uL (ref 3.87–5.11)
RDW: 12.4 % (ref 11.5–15.5)
WBC: 7.2 10*3/uL (ref 4.0–10.5)

## 2017-04-05 NOTE — Telephone Encounter (Signed)
Patient advised to get a CBC prior to her appointment. Lab order placed. She will call office if symptoms worsen.

## 2017-04-05 NOTE — Telephone Encounter (Signed)
Okay. Have her monitor symptoms and obtain CBC prior to her appointment with me on 11/21. Hopefully no further symptoms. I suspect this is hemorrhoidal bleeding given her history of this, but will evaluate her in clinic. If she has significant recurrent she should contact us in the interim. Thanks

## 2017-04-05 NOTE — Telephone Encounter (Signed)
Patient states that on Friday, 11/16, she had a lot of blood in bm, none on Saturday, and little yesterday. None today. She denies any lightheadedness, or SOB. She does not have a fever, is able to eat and drink as normal. States her abdominal pain is stable. She does have a follow up visit in office with Dr. Havery Moros on 11/21.

## 2017-04-07 ENCOUNTER — Ambulatory Visit: Payer: Medicaid Other | Admitting: Gastroenterology

## 2017-04-07 ENCOUNTER — Encounter: Payer: Self-pay | Admitting: Gastroenterology

## 2017-04-07 ENCOUNTER — Other Ambulatory Visit (INDEPENDENT_AMBULATORY_CARE_PROVIDER_SITE_OTHER): Payer: Medicaid Other

## 2017-04-07 VITALS — BP 122/78 | HR 68 | Ht 66.0 in | Wt 177.4 lb

## 2017-04-07 DIAGNOSIS — D869 Sarcoidosis, unspecified: Secondary | ICD-10-CM

## 2017-04-07 DIAGNOSIS — D8689 Sarcoidosis of other sites: Secondary | ICD-10-CM

## 2017-04-07 DIAGNOSIS — K648 Other hemorrhoids: Secondary | ICD-10-CM | POA: Diagnosis not present

## 2017-04-07 DIAGNOSIS — Z1211 Encounter for screening for malignant neoplasm of colon: Secondary | ICD-10-CM | POA: Diagnosis not present

## 2017-04-07 DIAGNOSIS — K625 Hemorrhage of anus and rectum: Secondary | ICD-10-CM | POA: Diagnosis not present

## 2017-04-07 DIAGNOSIS — K5900 Constipation, unspecified: Secondary | ICD-10-CM

## 2017-04-07 LAB — HEPATIC FUNCTION PANEL
ALT: 16 U/L (ref 0–35)
AST: 18 U/L (ref 0–37)
Albumin: 4.2 g/dL (ref 3.5–5.2)
Alkaline Phosphatase: 108 U/L (ref 39–117)
BILIRUBIN TOTAL: 0.8 mg/dL (ref 0.2–1.2)
Bilirubin, Direct: 0.1 mg/dL (ref 0.0–0.3)
TOTAL PROTEIN: 7.8 g/dL (ref 6.0–8.3)

## 2017-04-07 MED ORDER — LINACLOTIDE 290 MCG PO CAPS: 290.0000 ug | ORAL_CAPSULE | Freq: Every day | ORAL | 0 refills | Status: DC

## 2017-04-07 MED ORDER — SUPREP BOWEL PREP KIT 17.5-3.13-1.6 GM/177ML PO SOLN: ORAL | 0 refills | Status: DC

## 2017-04-07 NOTE — Progress Notes (Signed)
HPI :  16 y/ofemale patient with a history of type 1 diabetes, long QT syndrome, status post ICD. SVT, status post ablation. stable pulmonary nodules, IBS, fibromyalgia, peripheral neuropathy, here for follow up for hepatic / pulmonary sarcoidosis and chronic constipation, and rectal bleeding.  She previously had a [place] with elastography done otherwise showing a heterogenous liver without mass lesion, with elastography reported with F3-F4 score with high risk of fibrosis. She has had labs for chronic liver diseases which have been unremarkable other than elevated total IgG, with mild positive SMA, and negative ANA.She had a liver biopsy 08/02/15 - results c/w hepatic sarcoidosis with fibrotic changes but no evidence of cirrhosis. Portal pressures werenormal.   She's been doing fairly well since her last visit. Referred to Neurology for management of headaches. Have avoided reglan and phenergan due to prolonged QT. Gave trial of FD gard. She states her headaches are better controlled on her present regimen and her nausea is also better controlled. Her last liver function testing in June was normal, she is no longer on any steroids for hepatic sarcoidosis. She reports her breathing is stable.  Since her last visit she reported 2 episodes of bright red blood in stool over the past week. She has chronic constipation and is taking Linzess 145 g once daily. She states this is generally worked well for her in the past however is not working as well recently. She is having a bowel movement every 3-4 days on average. She reports having 2 episodes of passing bright red blood in this toilet as well as small amount of blood clot, overall moderate volume of blood. She reported this was painless. He denies any perianal or abdominal pain. Last episode of bleeding was 5 days ago and was a scant amount. She has a history of hemorrhoids as needed banding in the past.   Priorworkup: 10/24/14 Upper endoscopy:Normal, no  varices noted 10/25/14 oral contrast only CT abdomen pelvis: Probable mild hepatosplenomegaly. Enlarged uterus containing large left leiomyoma.  12/03/14 Gastric emptying study normal Colonoscopy 2006 - normal Flex sig 2010 - hemorrhoids Flex sig 2011 - banded hemorrhoids   Past Medical History:  Diagnosis Date  . Abdominal pain, periumbilic   . Allergic rhinitis   . Arthritis   . Asthma   . Automatic implantable cardioverter-defibrillator in situ 2006, replaced 2008   [employer]ON[employer]  . Bronchitis   . Cancer ([place])    Nodules in lungs pt believes were cancerous pt unsure  . DM (diabetes mellitus) ([place]) 1997/1998   type 1, initially gestational but soon after child birth developed IDDM  . Esophageal stricture 2005/2009   esophageal strictures dilated 2005, 2009  . Eye hemorrhage    bilateral  . Fibroid, uterine   . Fibromyalgia   . GERD (gastroesophageal reflux disease) 2005  . History of blood transfusion years ago  . Internal hemorrhoids 2010   2011 band ligation of int rrhoids.  . Long Q-T syndrome   . Neuropathy in diabetes ([place])   . Palpitation   . Sarcoidosis   . Seizure disorder ([place])    none recently  . Stroke ([place])    QUESTIONABLE TIA      Past Surgical History:  Procedure Laterality Date  . AUTOMATIC IMPLANTABLE CARDIAC DEFIBRILLATOR SITU  2006,    ICD-Medtronic   Remote - Yes   . CERVIX REMOVAL    . DILITATION & CURRETTAGE/HYSTROSCOPY WITH HYDROTHERMAL ABLATION N/A 06/07/2013   Procedure: DILATATION & CURETTAGE/HYSTEROSCOPY WITH attempted HYDROTHERMAL ABLATION;  Surgeon: Frederico Hamman, MD;  Location: [facility];  Service: Gynecology;  Laterality: N/A;  . EP IMPLANTABLE DEVICE N/A 04/04/2015   Procedure:  ICD Generator Changeout;  Surgeon: Evans Lance, MD;  Location: [facility];  Service: Cardiovascular;  Laterality: N/A;  . ESOPHAGOGASTRODUODENOSCOPY (EGD) WITH PROPOFOL N/A 10/24/2014   Procedure: ESOPHAGOGASTRODUODENOSCOPY (EGD) WITH PROPOFOL;   Surgeon: Inda Castle, MD;  Location: [facility];  Service: Endoscopy;  Laterality: N/A;  . EXPLORATORY LAPAROTOMY  11/06/14   evacuation of post TAH/BSO hematoma  . HAND SURGERY Right    x 2  . HEMORRHOID BANDING  03/2010  . SALPINGECTOMY    . SVT ablation    . TOTAL ABDOMINAL HYSTERECTOMY W/ BILATERAL SALPINGOOPHORECTOMY  11/06/14   at [facility]. for menorrhagia, pelvic pain and enlarging uterine fibroids  . TUBAL LIGATION    . VIDEO BRONCHOSCOPY Bilateral 01/24/2015   Procedure: VIDEO BRONCHOSCOPY WITHOUT FLUORO;  Surgeon: Marshell Garfinkel, MD;  Location: [facility];  Service: Cardiopulmonary;  Laterality: Bilateral;   Family History  Problem Relation Age of Onset  . Heart attack Mother   . Lung cancer Maternal Uncle        x2  . Breast cancer Maternal Aunt        x2  . Stomach cancer Paternal Uncle   . Stomach cancer Paternal Grandfather   . Stomach cancer Paternal Grandmother   . Diabetes Father   . Positive PPD/TB Exposure Father   . Glaucoma Father   . Colon cancer Neg Hx    Social History   Tobacco Use  . Smoking status: Never Smoker  . Smokeless tobacco: Never Used  Substance Use Topics  . Alcohol use: Yes    Alcohol/week: 0.0 oz    Comment: None since 2015-was occasional  . Drug use: No   Current Outpatient Medications  Medication Sig Dispense Refill  . ACCU-CHEK SOFTCLIX LANCETS lancets Use as instructed 100 each 12  . acetaminophen (TYLENOL) 500 MG tablet Take 1,000 mg by mouth every 6 (six) hours as needed for mild pain or fever. Reported on 07/22/2015    . beclomethasone (QVAR) 80 MCG/ACT inhaler Inhale 2 puffs into the lungs 2 (two) times daily. 1 Inhaler 5  . Blood Glucose Monitoring Suppl (ACCU-CHEK AVIVA PLUS) w/Device KIT 1 Device by Does not apply route daily. 1 kit 0  . clonazePAM (KLONOPIN) 0.5 MG tablet Take 1 tablet (0.5 mg total) by mouth 2 (two) times daily as needed for anxiety. 60 tablet 3  . cyclobenzaprine (FLEXERIL) 10 MG tablet Take 10 mg as  needed by mouth.    . cycloSPORINE (RESTASIS) 0.05 % ophthalmic emulsion Place 1 drop daily into both eyes.    . fluconazole (DIFLUCAN) 200 MG tablet Take 1 tablet (200 mg total) by mouth every other day. 3 tablet 0  . fluticasone (FLONASE) 50 MCG/ACT nasal spray Place 2 sprays into both nostrils daily as needed for allergies.     Marland Kitchen gabapentin (NEURONTIN) 300 MG capsule Take 1 capsule (300 mg total) by mouth 3 (three) times daily. 90 capsule 2  . glucose blood (ACCU-CHEK AVIVA PLUS) test strip Use as instructed 100 each 12  . Insulin Glargine (LANTUS SOLOSTAR) 100 UNIT/ML Solostar Pen Inject 40 Units into the skin at bedtime. 30 mL 5  . lactulose (CHRONULAC) 10 GM/15ML solution Take 30 ml TID 2700 mL 2  . lamoTRIgine (LAMICTAL) 150 MG tablet Take 1 tablet (150 mg total) by mouth 2 (two) times daily. 60 tablet  5  . Lancet Devices (ACCU-CHEK SOFTCLIX) lancets Use as instructed 1 each 0  . levETIRAcetam (KEPPRA) 500 MG tablet Take 1 tablet (500 mg total) by mouth 2 (two) times daily. 60 tablet 5  . Linaclotide (LINZESS) 145 MCG CAPS capsule Take 1 capsule (145 mcg total) by mouth daily. 30 capsule 3  . lubiprostone (AMITIZA) 8 MCG capsule Take 8 mcg as needed by mouth.    . meclizine (ANTIVERT) 32 MG tablet Take 1 tablet (32 mg total) by mouth 3 (three) times daily as needed. 30 tablet 0  . naproxen (NAPROSYN) 375 MG tablet Take 1 tablet (375 mg total) by mouth 2 (two) times daily with a meal. 60 tablet 3  . NOVOLOG FLEXPEN 100 UNIT/ML FlexPen Inject 4-8 Units into the skin 3 (three) times daily. 15 mL 0  . Olopatadine HCl 0.2 % SOLN Place 1 drop as needed into both eyes.    Marland Kitchen ondansetron (ZOFRAN) 4 MG tablet Take 4 mg as needed by mouth.    . pantoprazole (PROTONIX) 40 MG tablet Take 1 tablet (40 mg total) by mouth daily at 6 (six) AM. 30 tablet 2  . potassium chloride SA (K-DUR,KLOR-CON) 20 MEQ tablet take 1 tablet by mouth once daily 30 tablet 2  . predniSONE (DELTASONE) 5 MG tablet Take by mouth  daily with breakfast. 7.3m daily    . prenatal vitamin w/FE, FA (PRENATAL 1 + 1) 27-1 MG TABS tablet Take 1 tablet by mouth daily at 12 noon. 30 each 12  . propranolol (INDERAL) 80 MG tablet Take 1 tablet (80 mg total) 2 (two) times daily by mouth. 30 tablet 11  . topiramate (TOPAMAX) 25 MG tablet Take 2 tabs at night 60 tablet 6   No current facility-administered medications for this visit.    Allergies  Allergen Reactions  . Contrast Media [Iodinated Diagnostic Agents] Anaphylaxis and Shortness Of Breath    Needed to be defibrillated and patient was pre-medicated with radiology 13 hour premeds.   . Augmentin [Amoxicillin-Pot Clavulanate] Other (See Comments)    "stomach hurt"  . Codeine Nausea And Vomiting    jittery  . Demerol [Meperidine]   . Iohexol      Code: RASH, Desc: PT STATES SHE IS ALLERGIC TO IV CONTRAST 05/28/06/RM, Onset Date: [id number]  . Morphine And Related Nausea And Vomiting     Review of Systems: All systems reviewed and negative except where noted in HPI.    [place]elvic Complete With Transvaginal  Result Date: 03/11/2017 CLINICAL DATA:  Worsening left pelvic and lower quadrant pain for 2 months. Previous TAH-BSO. EXAM: TRANSABDOMINAL AND TRANSVAGINAL ULTRASOUND OF PELVIS TECHNIQUE: Both transabdominal and transvaginal ultrasound examinations of the pelvis were performed. Transabdominal technique was performed for global imaging of the pelvis including uterus, ovaries, adnexal regions, and pelvic cul-de-sac. It was necessary to proceed with endovaginal exam following the transabdominal exam to visualize the adnexa. COMPARISON:  None FINDINGS: Uterus Measurements: Surgically absent. Unremarkable appearance of vaginal cuff. Endometrium Thickness: Surgically absent. Right ovary Measurements: Surgically absent. No adnexal mass identified. Left ovary Measurements: Surgically absent. No adnexal mass identified. Other findings Tiny amount of simple free fluid noted.  IMPRESSION: Prior TAH-BSO. No pelvic mass or other significant abnormality identified. Electronically Signed   By: JEarle GellM.D.   On: 03/11/2017 11:31    Physical Exam: Wt 177 lb 6.4 oz (80.5 kg)   LMP 07/23/2014   BMI 28.63 kg/m  Constitutional: Pleasant,well-developed, female in no acute distress. HEENT: Normocephalic  and atraumatic. Conjunctivae are normal. No scleral icterus. Neck supple.  Cardiovascular: Normal rate, regular rhythm.  Pulmonary/chest: Effort normal and breath sounds normal. No wheezing, rales or rhonchi. Abdominal: Soft, nondistended, nontender. There are no masses palpable. No hepatomegaly.  DRE / Anoscopy - no fissure, no mass, internal hemorrhoids on anoscopy Extremities: no edema Lymphadenopathy: No cervical adenopathy noted. Neurological: Alert and oriented to person place and time. Skin: Skin is warm and dry. No rashes noted. Psychiatric: Normal mood and affect. Behavior is normal.   ASSESSMENT AND PLAN: 46 year old female here for reassessment of the following issues:  Hepatic sarcoidosis - her liver and lungs responded very nicely to a prolonged course of steroids per Dr. Kimber Relic. Last LFTs in June were normal. She is now off steroids completely (chart says she takes 7.88m / day but she denies this). No prior evidence of cirrhosis on biopsy. Due for repeat LFTs today, I will let her know the results.  Constipation / rectal bleeding / internal hemorrhoids - worsening constipation recently despite Linzess and 145 g daily. In the setting she's had 2 episodes of bright red blood in stool. Anoscopy today shows internal hemorrhoids that are inflamed as the likely etiology. Recommend increasing Linzess to 290 g daily. If this does not help her constipation I asked her call me back to discuss other options. She can use MiraLAX as needed on top of this. She is due for colon cancer screening as outlined below, in light of the symptoms will proceed with colonoscopy.  If no other pathology noted and her hemorrhoids continue to bother her despite adequate treatment of constipation, I will offer her hemorrhoid banding. She agreed with the plan.  Colon cancer screening - due for screening exam at this time. In light of her bleeding symptoms recommend optical colonoscopy. I discussed risks benefits of colonoscopy and anesthesia and she wanted to proceed.  SCarolina Cellar MD [facility] Pager [phone] CC: WCaryl Never

## 2017-04-07 NOTE — Patient Instructions (Addendum)
If you are age 45 or older, your body mass index should be between 23-30. Your Body mass index is 28.63 kg/m. If this is out of the aforementioned range listed, please consider follow up with your Primary Care Provider.  If you are age 44 or younger, your body mass index should be between 19-25. Your Body mass index is 28.63 kg/m. If this is out of the aformentioned range listed, please consider follow up with your Primary Care Provider.   You have been scheduled for a colonoscopy. Please follow written instructions given to you at your visit today.  Please pick up your prep supplies at the pharmacy within the next 1-3 days. If you use inhalers (even only as needed), please bring them with you on the day of your procedure. Your physician has requested that you go to www.startemmi.com and enter the access code given to you at your visit today. This web site gives a general overview about your procedure. However, you should still follow specific instructions given to you by our office regarding your preparation for the procedure.  Your physician has requested that you go to the basement for the following lab work before leaving today: LFT  We have given you a sample of Linzess 240mcg today to try.  Thank you.

## 2017-04-12 NOTE — Progress Notes (Signed)
Lab results

## 2017-04-19 ENCOUNTER — Ambulatory Visit (AMBULATORY_SURGERY_CENTER): Payer: Medicaid Other | Admitting: Gastroenterology

## 2017-04-19 ENCOUNTER — Encounter: Payer: Self-pay | Admitting: Gastroenterology

## 2017-04-19 VITALS — BP 105/71 | HR 68 | Temp 96.4°F | Resp 18 | Ht 66.0 in | Wt 177.0 lb

## 2017-04-19 DIAGNOSIS — D125 Benign neoplasm of sigmoid colon: Secondary | ICD-10-CM | POA: Diagnosis not present

## 2017-04-19 DIAGNOSIS — Z1211 Encounter for screening for malignant neoplasm of colon: Secondary | ICD-10-CM

## 2017-04-19 DIAGNOSIS — D123 Benign neoplasm of transverse colon: Secondary | ICD-10-CM

## 2017-04-19 DIAGNOSIS — D122 Benign neoplasm of ascending colon: Secondary | ICD-10-CM | POA: Diagnosis not present

## 2017-04-19 MED ORDER — SODIUM CHLORIDE 0.9 % IV SOLN: 500.0000 mL | INTRAVENOUS | Status: DC

## 2017-04-19 NOTE — Progress Notes (Signed)
A/ox3 pleased with MAC, report to [employer]

## 2017-04-19 NOTE — Progress Notes (Signed)
Called to room to assist during endoscopic procedure.  Patient ID and intended procedure confirmed with present staff. Received instructions for my participation in the procedure from the performing physician.  

## 2017-04-19 NOTE — Op Note (Signed)
[facility] Patient Name: [name] Procedure Date: 04/19/2017 11:06 AM MRN: [id number] Endoscopist: Remo Lipps P. Coretha Creswell MD, MD Age: 46 Referring MD:  Date of Birth: 11/01/1970 Gender: Female Account #: [id number] Procedure:                Colonoscopy Indications:              Screening for colorectal malignant neoplasm, This                            is the patient's first colonoscopy Medicines:                Monitored Anesthesia Care Procedure:                Pre-Anesthesia Assessment:                           - Prior to the procedure, a History and Physical                            was performed, and patient medications and                            allergies were reviewed. The patient's tolerance of                            previous anesthesia was also reviewed. The risks                            and benefits of the procedure and the sedation                            options and risks were discussed with the patient.                            All questions were answered, and informed consent                            was obtained. Prior Anticoagulants: The patient has                            taken no previous anticoagulant or antiplatelet                            agents. ASA Grade Assessment: III - A patient with                            severe systemic disease. After reviewing the risks                            and benefits, the patient was deemed in                            satisfactory condition to undergo the procedure.  After obtaining informed consent, the colonoscope                            was passed under direct vision. Throughout the                            procedure, the patient's blood pressure, pulse, and                            oxygen saturations were monitored continuously. The                            Colonoscope was introduced through the anus and                            advanced to  the the terminal ileum, with                            identification of the appendiceal orifice and IC                            valve. The colonoscopy was performed without                            difficulty. The patient tolerated the procedure                            well. The quality of the bowel preparation was                            good. The terminal ileum, ileocecal valve,                            appendiceal orifice, and rectum were photographed. Scope In: 11:13:27 AM Scope Out: 11:34:06 AM Scope Withdrawal Time: 0 hours 17 minutes 45 seconds  Total Procedure Duration: 0 hours 20 minutes 39 seconds  Findings:                 The perianal and digital rectal examinations were                            normal.                           The terminal ileum appeared normal.                           A 6 mm polyp was found in the ascending colon. The                            polyp was sessile. The polyp was removed with a                            cold snare. Resection and retrieval were complete.  A 10 mm polyp was found in the hepatic flexure. The                            polyp was sessile. The polyp was removed with a                            cold snare. Resection and retrieval were complete.                           A 3 mm polyp was found in the sigmoid colon. The                            polyp was sessile. The polyp was removed with a                            cold snare. Resection and retrieval were complete.                           Internal hemorrhoids were found during retroflexion.                           The exam was otherwise without abnormality. Complications:            No immediate complications. Estimated blood loss:                            Minimal. Estimated Blood Loss:     Estimated blood loss was minimal. Impression:               - The examined portion of the ileum was normal.                           - One 6  mm polyp in the ascending colon, removed                            with a cold snare. Resected and retrieved.                           - One 10 mm polyp at the hepatic flexure, removed                            with a cold snare. Resected and retrieved.                           - One 3 mm polyp in the sigmoid colon, removed with                            a cold snare. Resected and retrieved.                           - Internal hemorrhoids.                           -  The examination was otherwise normal. Recommendation:           - Patient has a contact number available for                            emergencies. The signs and symptoms of potential                            delayed complications were discussed with the                            patient. Return to normal activities tomorrow.                            Written discharge instructions were provided to the                            patient.                           - Resume previous diet.                           - Continue present medications.                           - Continue Linzess for constipation, follow up for                            hemorrhoid banding if symptoms from hemorrhoids                            persist                           - Await pathology results.                           - Repeat colonoscopy is recommended for                            surveillance. The colonoscopy date will be                            determined after pathology results from today's                            exam become available for review.                           - No ibuprofen, naproxen, or other non-steroidal                            anti-inflammatory drugs for 2 weeks after polyp                            removal. Remo Lipps P. Xaine Sansom MD,  MD 04/19/2017 11:38:24 AM This report has been signed electronically.

## 2017-04-19 NOTE — Patient Instructions (Signed)
Impression/Recommendations:  Polyp handout given to patient. Hemorrhoid handout given to patient.  Resume previous diet. Continue present medications.  Continue Linzess for constipation.  Repeat colonoscopy recommended for surveillance.  Date to be determined after pathology results reviewed.  No ibuprofen, naproxen, or other NSAID drugs for 2 weeks.  Tylenol only until 05/03/2017.  YOU HAD AN ENDOSCOPIC PROCEDURE TODAY AT [facility]:   Refer to the procedure report that was given to you for any specific questions about what was found during the examination.  If the procedure report does not answer your questions, please call your gastroenterologist to clarify.  If you requested that your care partner not be given the details of your procedure findings, then the procedure report has been included in a sealed envelope for you to review at your convenience later.  YOU SHOULD EXPECT: Some feelings of bloating in the abdomen. Passage of more gas than usual.  Walking can help get rid of the air that was put into your GI tract during the procedure and reduce the bloating. If you had a lower endoscopy (such as a colonoscopy or flexible sigmoidoscopy) you may notice spotting of blood in your stool or on the toilet paper. If you underwent a bowel prep for your procedure, you may not have a normal bowel movement for a few days.  Please Note:  You might notice some irritation and congestion in your nose or some drainage.  This is from the oxygen used during your procedure.  There is no need for concern and it should clear up in a day or so.  SYMPTOMS TO REPORT IMMEDIATELY:   Following lower endoscopy (colonoscopy or flexible sigmoidoscopy):  Excessive amounts of blood in the stool  Significant tenderness or worsening of abdominal pains  Swelling of the abdomen that is new, acute  Fever of 100F or higher  For urgent or emergent issues, a gastroenterologist can be reached at any  hour by calling [phone].   DIET:  We do recommend a small meal at first, but then you may proceed to your regular diet.  Drink plenty of fluids but you should avoid alcoholic beverages for 24 hours.  ACTIVITY:  You should plan to take it easy for the rest of today and you should NOT DRIVE or use heavy machinery until tomorrow (because of the sedation medicines used during the test).    FOLLOW UP: Our staff will call the number listed on your records the next business day following your procedure to check on you and address any questions or concerns that you may have regarding the information given to you following your procedure. If we do not reach you, we will leave a message.  However, if you are feeling well and you are not experiencing any problems, there is no need to return our call.  We will assume that you have returned to your regular daily activities without incident.  If any biopsies were taken you will be contacted by phone or by letter within the next 1-3 weeks.  Please call us at [phone] if you have not heard about the biopsies in 3 weeks.    SIGNATURES/CONFIDENTIALITY: You and/or your care partner have signed paperwork which will be entered into your electronic medical record.  These signatures attest to the fact that that the information above on your After Visit Summary has been reviewed and is understood.  Full responsibility of the confidentiality of this discharge information lies with you and/or your care-partner.

## 2017-04-20 ENCOUNTER — Telehealth: Payer: Self-pay | Admitting: *Deleted

## 2017-04-20 NOTE — Telephone Encounter (Signed)
  Follow up Call-  Call back number 04/19/2017  Post procedure Call Back phone  # [phone]  Permission to leave phone message Yes  Some recent data might be hidden     Patient questions:  Do you have a fever, pain , or abdominal swelling? No. Pain Score  0 *  Have you tolerated food without any problems? Yes.    Have you been able to return to your normal activities? Yes.    Do you have any questions about your discharge instructions: Diet   No. Medications  No. Follow up visit  No.  Do you have questions or concerns about your Care? No.  Actions: * If pain score is 4 or above: No action needed, pain <4. Patient inquired as to why she can not take NSAIDS. Reviewed with patient.

## 2017-04-24 ENCOUNTER — Encounter: Payer: Self-pay | Admitting: Gastroenterology

## 2017-04-30 LAB — CUP PACEART INCLINIC DEVICE CHECK
HIGH POWER IMPEDANCE MEASURED VALUE: 70 Ohm
HighPow Impedance: 47 Ohm
Implantable Lead Implant Date: 20080828
Lead Channel Impedance Value: 323 Ohm
Lead Channel Pacing Threshold Amplitude: 1.5 V
Lead Channel Pacing Threshold Pulse Width: 0.4 ms
Lead Channel Sensing Intrinsic Amplitude: 5 mV
MDC IDC LEAD LOCATION: 753860
MDC IDC MSMT BATTERY REMAINING LONGEVITY: 124 mo
MDC IDC MSMT BATTERY VOLTAGE: 3.03 V
MDC IDC MSMT LEADCHNL RV IMPEDANCE VALUE: 380 Ohm
MDC IDC PG IMPLANT DT: 20161117
MDC IDC SESS DTM: 20181114175955
MDC IDC SET LEADCHNL RV PACING AMPLITUDE: 3 V
MDC IDC SET LEADCHNL RV PACING PULSEWIDTH: 0.4 ms
MDC IDC SET LEADCHNL RV SENSING SENSITIVITY: 0.3 mV
MDC IDC STAT BRADY RV PERCENT PACED: 0.01 %

## 2017-05-26 ENCOUNTER — Telehealth: Payer: Self-pay | Admitting: *Deleted

## 2017-05-26 ENCOUNTER — Other Ambulatory Visit: Payer: Self-pay | Admitting: Internal Medicine

## 2017-05-26 MED ORDER — INSULIN PEN NEEDLE 31G X 8 MM MISC: 3 refills | Status: DC

## 2017-05-26 NOTE — Progress Notes (Signed)
Sent in needles.   Phill Myron, D.O. 05/26/2017, 12:10 PM PGY-3, [place]

## 2017-05-26 NOTE — Progress Notes (Signed)
Pen needles are not listed on drug list. Will need to know appropriate size for re-order.   Phill Myron, D.O. 05/26/2017, 11:35 AM PGY-3, [place]

## 2017-05-26 NOTE — Telephone Encounter (Signed)
Patient left message on nurse line requesting refill on pen needles. Hubbard Hartshorn, RN, BSN

## 2017-05-26 NOTE — Progress Notes (Signed)
Pt stated that she did not know the exact number. She said the small ones that were ordered before. She said she would make it work whatever size is sent in.  She said that she is having to pay like 50 dollars for a box. Routing to PCP. Katharina Caper, [name], [place]

## 2017-05-26 NOTE — Addendum Note (Signed)
Addended by: Melina Schools on: 05/26/2017 12:10 PM   Modules accepted: Orders

## 2017-06-21 ENCOUNTER — Ambulatory Visit (INDEPENDENT_AMBULATORY_CARE_PROVIDER_SITE_OTHER): Payer: Medicaid Other | Admitting: *Deleted

## 2017-06-21 DIAGNOSIS — I4581 Long QT syndrome: Secondary | ICD-10-CM | POA: Diagnosis not present

## 2017-06-21 DIAGNOSIS — Z95810 Presence of automatic (implantable) cardiac defibrillator: Secondary | ICD-10-CM

## 2017-06-21 NOTE — Progress Notes (Signed)
Remote ICD transmission.   

## 2017-06-22 ENCOUNTER — Encounter: Payer: Self-pay | Admitting: Cardiology

## 2017-07-11 LAB — CUP PACEART REMOTE DEVICE CHECK
Battery Voltage: 3.03 V
Brady Statistic RV Percent Paced: 0 %
HIGH POWER IMPEDANCE MEASURED VALUE: 50 Ohm
HighPow Impedance: 79 Ohm
Implantable Lead Model: 6947
Lead Channel Impedance Value: 342 Ohm
Lead Channel Impedance Value: 399 Ohm
Lead Channel Sensing Intrinsic Amplitude: 7 mV
Lead Channel Setting Pacing Pulse Width: 0.4 ms
Lead Channel Setting Sensing Sensitivity: 0.3 mV
MDC IDC LEAD IMPLANT DT: 20080828
MDC IDC LEAD LOCATION: 753860
MDC IDC MSMT BATTERY REMAINING LONGEVITY: 122 mo
MDC IDC MSMT LEADCHNL RV PACING THRESHOLD AMPLITUDE: 1.375 V
MDC IDC MSMT LEADCHNL RV PACING THRESHOLD PULSEWIDTH: 0.4 ms
MDC IDC MSMT LEADCHNL RV SENSING INTR AMPL: 7 mV
MDC IDC PG IMPLANT DT: 20161117
MDC IDC SESS DTM: 20190204060113
MDC IDC SET LEADCHNL RV PACING AMPLITUDE: 3 V

## 2017-07-14 ENCOUNTER — Telehealth: Payer: Self-pay | Admitting: Internal Medicine

## 2017-07-14 NOTE — Telephone Encounter (Signed)
New Message   Pt has questions about an IVC filter and how many MRI's did she have done. Please call

## 2017-07-14 NOTE — Telephone Encounter (Signed)
Returned call to Pt.  Pt has 4 questions: 1.  How many MRI's has she had 2.  Does she have gandolenim poisoning? 3.  Did Dr. Lovena Le put in an IVC filter when he put in her ICD 4.  Does she have:  [place], GDD or NFF  Notified Pt only question this office can answer is that Dr. Lovena Le did not put in an IVC filter for her.  Advised her to call her PCP to follow up with her other questions.  Pt indicates understanding.

## 2017-07-21 ENCOUNTER — Other Ambulatory Visit: Payer: Self-pay

## 2017-07-21 ENCOUNTER — Ambulatory Visit: Payer: Medicaid Other | Admitting: Internal Medicine

## 2017-07-21 ENCOUNTER — Encounter: Payer: Self-pay | Admitting: Internal Medicine

## 2017-07-21 VITALS — BP 144/88 | HR 77 | Temp 99.0°F | Ht 66.0 in | Wt 182.2 lb

## 2017-07-21 DIAGNOSIS — G8929 Other chronic pain: Secondary | ICD-10-CM | POA: Diagnosis not present

## 2017-07-21 DIAGNOSIS — M546 Pain in thoracic spine: Secondary | ICD-10-CM

## 2017-07-21 MED ORDER — CYCLOBENZAPRINE HCL 10 MG PO TABS: 10.0000 mg | ORAL_TABLET | Freq: Three times a day (TID) | ORAL | 0 refills | Status: DC | PRN

## 2017-07-21 MED ORDER — GABAPENTIN 300 MG PO CAPS: 300.0000 mg | ORAL_CAPSULE | Freq: Three times a day (TID) | ORAL | 2 refills | Status: DC

## 2017-07-21 NOTE — Patient Instructions (Signed)
I believe your back pain is related to muscle tension. The left side of your upper back is much more tight and tense than the right side. I have prescribed a muscle relaxer. I recommend taking this at nighttime. I would also use heating pads, light massage, and muscle rubs over that area of pain.

## 2017-07-21 NOTE — Progress Notes (Signed)
   Subjective:    [name] - 47 y.o. female MRN [id number]  Date of birth: [birth year]  HPI  [name] is here for back pain.   BACK PAIN  Back pain began 2+ months ago. Located in left upper back. Pain is described as dull, tight sensation. Reports left upper back feels "more full" than right side.  Patient has tried Salonpas pain patches. Pain radiates nowhere. History of trauma or injury: no Patient believes might be causing their pain: unsure   Prior history of similar pain: no History of cancer: no Weak immune system:  no History of IV drug use: no History of steroid use: no  Symptoms Incontinence of bowel or bladder:  no Numbness of arm: no Weakness of arm: no  Fever: no Rest or Night pain: no Weight Loss:  no Rash: no   -  reports that  has never smoked. she has never used smokeless tobacco. - Review of Systems: Per HPI. - Past Medical History: Patient Active Problem List   Diagnosis Date Noted  . Migraine without aura and without status migrainosus, not intractable 02/16/2017  . Blurry vision, bilateral 12/30/2016  . Chronic daily headache 11/09/2016  . Neck pain 07/20/2016  . Lower extremity weakness 06/17/2016  . 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 Occipital neuralgia of left side 03/05/2016  . Other headache syndrome 03/05/2016  . Gait disturbance 02/12/2015  . 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 Chronic fatigue 01/01/2015  . Nausea without vomiting 12/11/2014  . Dyspnea 12/10/2014  . Weight loss   . Long QT interval 11/06/2014  . PSVT 02/24/2010  . PRECORDIAL PAIN 08/22/2009  . Palpitations 10/05/2008  . Automatic implantable cardioverter-defibrillator in situ 10/05/2008  . Absence seizure disorder ([place]) 09/26/2008  . Constipation 02/08/2008  . Sarcoidosis, stage 3 03/15/2007  . Diabetes mellitus, type 2 ([place]) 03/15/2007  . Essential hypertension 03/15/2007  . SYNDROME, LONG QT  03/15/2007  . ALLERGIC RHINITIS 03/15/2007  . OBSTRUCTIVE CHRONIC BRONCHITIS 03/15/2007  . ESOPHAGITIS 02/05/2004  . ESOPHAGEAL STRICTURE 02/05/2004   - Medications: reviewed and updated   Objective:   Physical Exam BP (!) 144/88   Pulse 77   Temp 99 F (37.2 C) (Oral)   Ht 5\' 6"  (1.676 m)   Wt 182 lb 3.2 oz (82.6 kg)   LMP 07/23/2014   SpO2 98%   BMI 29.41 kg/m  Gen: NAD, alert, cooperative with exam, well-appearing MSK: TTP over the left trapezius muscle. No TTP over the right side or thoracic spine. Left trapezius muscle feels ropey and tight compared to right. Full ROM at thoracic spine. Strength 5/5 in upper extremities.     Assessment & Plan:   1. Chronic left-sided thoracic back pain History and exam consistent with chronic muscle tension. No red flags. Will treat with Flexeril. Have also recommended muscle rubs, massage, and heat to the area. Return if symptoms fail to improve.  - cyclobenzaprine (FLEXERIL) 10 MG tablet; Take 1 tablet (10 mg total) by mouth 3 (three) times daily as needed for muscle spasms.  Dispense: 30 tablet; Refill: 0  Phill Myron, D.O. 07/21/2017, 11:07 AM PGY-3, [place]

## 2017-07-28 ENCOUNTER — Other Ambulatory Visit: Payer: Self-pay | Admitting: Internal Medicine

## 2017-07-28 LAB — HM DIABETES EYE EXAM

## 2017-08-04 ENCOUNTER — Ambulatory Visit: Payer: Medicaid Other | Admitting: Neurology

## 2017-08-04 ENCOUNTER — Other Ambulatory Visit: Payer: Self-pay

## 2017-08-04 ENCOUNTER — Encounter: Payer: Self-pay | Admitting: Neurology

## 2017-08-04 VITALS — BP 134/78 | HR 80 | Ht 66.5 in | Wt 185.0 lb

## 2017-08-04 DIAGNOSIS — G40B09 Juvenile myoclonic epilepsy, not intractable, without status epilepticus: Secondary | ICD-10-CM

## 2017-08-04 DIAGNOSIS — G43009 Migraine without aura, not intractable, without status migrainosus: Secondary | ICD-10-CM

## 2017-08-04 DIAGNOSIS — R251 Tremor, unspecified: Secondary | ICD-10-CM

## 2017-08-04 MED ORDER — LAMOTRIGINE 150 MG PO TABS: 150.0000 mg | ORAL_TABLET | Freq: Two times a day (BID) | ORAL | 5 refills | Status: DC

## 2017-08-04 MED ORDER — TOPIRAMATE 25 MG PO TABS: ORAL_TABLET | ORAL | 6 refills | Status: DC

## 2017-08-04 MED ORDER — LEVETIRACETAM 500 MG PO TABS: 500.0000 mg | ORAL_TABLET | Freq: Two times a day (BID) | ORAL | 5 refills | Status: DC

## 2017-08-04 NOTE — Patient Instructions (Signed)
1. Increase Topamax 25mg : take 3 tabs at night 2. Continue Keppra 500mg  twice a day and Lamotrigine 150mg  twice a day 3. Keep a calendar of your headaches 4. Follow-up in 5-6 months, call for any changes  Seizure Precautions: 1. If medication has been prescribed for you to prevent seizures, take it exactly as directed.  Do not stop taking the medicine without talking to your doctor first, even if you have not had a seizure in a long time.   2. Avoid activities in which a seizure would cause danger to yourself or to others.  Don't operate dangerous machinery, swim alone, or climb in high or dangerous places, such as on ladders, roofs, or girders.  Do not drive unless your doctor says you may.  3. If you have any warning that you may have a seizure, lay down in a safe place where you can't hurt yourself.    4.  No driving for 6 months from last seizure, as per [facility].   Please refer to the following link on the [place] website for more information: http://www.epilepsyfoundation.org/answerplace/Social/driving/drivingu.cfm   5.  Maintain good sleep hygiene. Avoid alcohol.  6.  Notify your neurology if you are planning pregnancy or if you become pregnant.  7.  Contact your doctor if you have any problems that may be related to the medicine you are taking.  8.  Call 911 and bring the patient back to the ED if:        A.  The seizure lasts longer than 5 minutes.       B.  The patient doesn't awaken shortly after the seizure  C.  The patient has new problems such as difficulty seeing, speaking or moving  D.  The patient was injured during the seizure  E.  The patient has a temperature over 102 F (39C)  F.  The patient vomited and now is having trouble breathing

## 2017-08-04 NOTE — Progress Notes (Signed)
NEUROLOGY FOLLOW UP OFFICE NOTE  [name] [id number] 07/17/1970  HISTORY OF PRESENT ILLNESS: I had the pleasure of seeing [name] in follow-up in the neurology clinic on 08/04/2017.  The patient was last seen 5 months ago for juvenile myoclonic epilepsy and frequent headaches. She had been taking Lamotrigine 144m BID and Keppra 5033mBID for the seizures. She was started on Topamax last July 2018 for migraines, currently on 5024mhs. She has noticed an improvement in migraines, she can go several weeks without headaches, but had a 2-week bout of increased migraines last week where she had to take Tylenol 6 times. She reports a lot of throbbing on the left side of her head. She does not sleep well due to back pain. She denies any convulsions or blackouts, she has not woken up with tongue bite or incontinence for a year. She continues to have occasional myoclonic jerks. She has noticed tremors in both hands when she is using them for prolonged periods such as eating dinner instead of just snacks, or when writing or using a mouse. No family history of tremors. She has had 2 falls since last visit, no associated body jerk or loss of consciousness, she hurt both knees.   HPI 11/05/2016: This is a [age]ar old ambidextrous left-hand dominant woman with a history of sarcoidosis (pulmonary and hepatic), diabetes, ventricular tachycardia s/p AICD placement, long Q-T syndrome, juvenile myoclonic epilepsy, with seizures and headaches. She had been seeing neurologist Dr. SatFelecia Shellingd states that she grew up never treated for seizures but from time to time was told she had symptoms that sounded like seizure. She started seeing Neurology in her late 20s60sen she had episodes where she would briefly blackout and become confused, not knowing what was going on. She would have quick body jerks but no convulsive activity. She was taking clonazepam at that time but was not on medication when she started seeing Dr.  SatFelecia Shelling October 2017. An EEG done 03/20/2016 showed several 3-6 second bursts of generalized 3 Hz polyspike wave activity. There were also some temporal sharp waves seen. EEG reported as consistent with juvenile myoclonic epilepsy. She was started on Lamotrigine in November 2017, increased to 100m4mD. Clonazepam 0.5mg 31m was added in November 2017, but per records it is prn for anxiety. Keppra 500mg 54mwas added in March 2018 for seizure and headache prophylaxis. She reports last blackout episode was in March 2018. She continues to have myoclonic jerks a few times a week affecting her arms. They can occur anytime during the day. She denies any convulsions but has woken up with her tongue bitten and her bed wet. Last episode of waking up with tongue bite was last week. She reports a history of bad headaches since elementary school, but now she feels her head is full with constant 4/10 pain waxing and waning in severity to 10/10 3-4 times a month. Pain is usually on the left parietal or vertex region, sometimes with pressure or throbbing on the left side. She has nausea and vomiting, occasional photo and phonophobia. She takes Tylenol on occasion, a small bottle lasts her a month, but recently she has not been taking anything. Occipital nerve block did not help in the past. She usually gets 3-4 hours of sleep due to chronic neck and back pain. She reports her mind is always wandering. She has a history of fibromyalgia and her body is always sore. She has diabetic neuropathy, and reports that there  is always some tingling or numbness somewhere in her arms and legs. She has occasional constipation. She reports a fall last Wednesday. She denies any significant head injuries. She has been taking gabapentin for neuropathy for the past 4-5 years. She has occasional anxiety. She feels like her left leg drags more. She had seen ophthalmologist Dr. Tama High and was told there was "increased pressure," records will  be requested for review. She denies any olfactory/gustatory hallucinations, deja vu, rising epigastric sensation. Her mother had migraines, maternal grandfather had migraines.   Epilepsy Risk Factors:  There is a strong family history of seizures in her maternal grandfather, maternal uncles. She had a normal birth and early development.  There is no history of febrile convulsions, CNS infections such as meningitis/encephalitis, significant traumatic brain injury, neurosurgical procedures.   I personally reviewed head CT without contrast done 11/2014 which did not show any acute changes.   PAST MEDICAL HISTORY: Past Medical History:  Diagnosis Date  . Abdominal pain, periumbilic   . Allergic rhinitis   . Allergy   . Anemia   . Anxiety   . Arthritis   . Asthma   . Automatic implantable cardioverter-defibrillator in situ 2006, replaced 2008   [employer]  . Bronchitis   . Cancer ([place])    Nodules in lungs pt believes were cancerous pt unsure  . COPD (chronic obstructive pulmonary disease) ([place])   . DM (diabetes mellitus) ([place]) 1997/1998   type 1, initially gestational but soon after child birth developed IDDM  . Esophageal stricture 2005/2009   esophageal strictures dilated 2005, 2009  . Eye hemorrhage    bilateral  . Fibroid, uterine   . Fibromyalgia   . GERD (gastroesophageal reflux disease) 2005  . History of blood transfusion years ago  . Hypertension   . Internal hemorrhoids 2010   2011 band ligation of int rrhoids.  . Long Q-T syndrome   . Neuropathy in diabetes ([place])   . Palpitation   . Sarcoidosis   . Seizure disorder ([place])    none recently  . Stroke ([place])    QUESTIONABLE TIA     MEDICATIONS: Current Outpatient Medications on File Prior to Visit  Medication Sig Dispense Refill  . ACCU-CHEK SOFTCLIX LANCETS lancets Use as instructed 100 each 12  . acetaminophen (TYLENOL) 500 MG tablet Take 1,000 mg by mouth every 6 (six) hours as needed for mild pain or fever.  Reported on 07/22/2015    . beclomethasone (QVAR) 80 MCG/ACT inhaler Inhale 2 puffs into the lungs 2 (two) times daily. 1 Inhaler 5  . Blood Glucose Monitoring Suppl (ACCU-CHEK AVIVA PLUS) w/Device KIT 1 Device by Does not apply route daily. 1 kit 0  . clonazePAM (KLONOPIN) 0.5 MG tablet Take 1 tablet (0.5 mg total) by mouth 2 (two) times daily as needed for anxiety. 60 tablet 3  . cyclobenzaprine (FLEXERIL) 10 MG tablet Take 1 tablet (10 mg total) by mouth 3 (three) times daily as needed for muscle spasms. 30 tablet 0  . cycloSPORINE (RESTASIS) 0.05 % ophthalmic emulsion Place 1 drop daily into both eyes.    . fluticasone (FLONASE) 50 MCG/ACT nasal spray Place 2 sprays into both nostrils daily as needed for allergies.     Marland Kitchen gabapentin (NEURONTIN) 300 MG capsule Take 1 capsule (300 mg total) by mouth 3 (three) times daily. 90 capsule 2  . glucose blood (ACCU-CHEK AVIVA PLUS) test strip Use as instructed 100 each 12  . Insulin Glargine (LANTUS SOLOSTAR) 100 UNIT/ML Solostar  Pen Inject 40 Units into the skin at bedtime. 30 mL 5  . Insulin Pen Needle 31G X 8 MM MISC BD UltraFine III Pen Needles. For use with insulin pen device. Inject insulin 6 x daily 200 each 3  . lactulose (CHRONULAC) 10 GM/15ML solution Take 30 ml TID 2700 mL 2  . lamoTRIgine (LAMICTAL) 150 MG tablet Take 1 tablet (150 mg total) by mouth 2 (two) times daily. 60 tablet 5  . Lancet Devices (ACCU-CHEK SOFTCLIX) lancets Use as instructed 1 each 0  . levETIRAcetam (KEPPRA) 500 MG tablet Take 1 tablet (500 mg total) by mouth 2 (two) times daily. 60 tablet 5  . linaclotide (LINZESS) 290 MCG CAPS capsule Take 1 capsule (290 mcg total) by mouth daily before breakfast. 12 capsule 0  . meclizine (ANTIVERT) 32 MG tablet Take 1 tablet (32 mg total) by mouth 3 (three) times daily as needed. 30 tablet 0  . naproxen (NAPROSYN) 375 MG tablet Take 1 tablet (375 mg total) by mouth 2 (two) times daily with a meal. 60 tablet 3  . NOVOLOG FLEXPEN 100  UNIT/ML FlexPen INJECT 4 TO 8 UNITS INTO THE SKIN 3 TIMES DAILY 15 mL 0  . Olopatadine HCl 0.2 % SOLN Place 1 drop as needed into both eyes.    Marland Kitchen ondansetron (ZOFRAN) 4 MG tablet Take 4 mg as needed by mouth.    . pantoprazole (PROTONIX) 40 MG tablet Take 1 tablet (40 mg total) by mouth daily at 6 (six) AM. 30 tablet 2  . potassium chloride SA (K-DUR,KLOR-CON) 20 MEQ tablet take 1 tablet by mouth once daily 30 tablet 2  . predniSONE (DELTASONE) 5 MG tablet Take by mouth daily with breakfast. 7.60m daily    . prenatal vitamin w/FE, FA (PRENATAL 1 + 1) 27-1 MG TABS tablet Take 1 tablet by mouth daily at 12 noon. 30 each 12  . propranolol (INDERAL) 80 MG tablet Take 1 tablet (80 mg total) 2 (two) times daily by mouth. 30 tablet 11  . topiramate (TOPAMAX) 25 MG tablet Take 2 tabs at night 60 tablet 6   No current facility-administered medications on file prior to visit.     ALLERGIES: Allergies  Allergen Reactions  . Contrast Media [Iodinated Diagnostic Agents] Anaphylaxis and Shortness Of Breath    Needed to be defibrillated and patient was pre-medicated with radiology 13 hour premeds.   . Augmentin [Amoxicillin-Pot Clavulanate] Other (See Comments)    "stomach hurt"  . Codeine Nausea And Vomiting    jittery  . Demerol [Meperidine]   . Iohexol      Code: RASH, Desc: PT STATES SHE IS ALLERGIC TO IV CONTRAST 05/28/06/RM, Onset Date: [id number]  . Morphine And Related Nausea And Vomiting    FAMILY HISTORY: Family History  Problem Relation Age of Onset  . Heart attack Mother   . Lung cancer Maternal Uncle        x2  . Breast cancer Maternal Aunt        x2  . Stomach cancer Paternal Uncle   . Stomach cancer Paternal Grandfather   . Stomach cancer Paternal Grandmother   . Diabetes Father   . Positive PPD/TB Exposure Father   . Glaucoma Father   . Esophageal cancer Paternal Aunt   . Colon cancer Maternal Grandfather     SOCIAL HISTORY: Social History   Socioeconomic History  .  Marital status: Single    Spouse name: Not on file  . Number of children: Not on  file  . Years of education: Not on file  . Highest education level: Not on file  Social Needs  . Financial resource strain: Not on file  . Food insecurity - worry: Not on file  . Food insecurity - inability: Not on file  . Transportation needs - medical: Not on file  . Transportation needs - non-medical: Not on file  Occupational History  . Not on file  Tobacco Use  . Smoking status: Never Smoker  . Smokeless tobacco: Never Used  Substance and Sexual Activity  . Alcohol use: Yes    Alcohol/week: 0.0 oz    Comment: None since 2015-was occasional  . Drug use: No  . Sexual activity: Yes    Birth control/protection: Condom  Other Topics Concern  . Not on file  Social History Narrative  . Not on file    REVIEW OF SYSTEMS: Constitutional: No fevers, chills, or sweats, no generalized fatigue, change in appetite Eyes: No visual changes, double vision, eye pain Ear, nose and throat: No hearing loss, ear pain, nasal congestion, sore throat Cardiovascular: No chest pain, palpitations Respiratory:  No shortness of breath at rest or with exertion, wheezes GastrointestinaI: No nausea, vomiting, diarrhea, abdominal pain, fecal incontinence Genitourinary:  No dysuria, urinary retention or frequency Musculoskeletal:  + neck pain, back pain Integumentary: No rash, pruritus, skin lesions Neurological: as above Psychiatric: No depression, insomnia, anxiety Endocrine: No palpitations, fatigue, diaphoresis, mood swings, change in appetite, change in weight, increased thirst Hematologic/Lymphatic:  No anemia, purpura, petechiae. Allergic/Immunologic: no itchy/runny eyes, nasal congestion, recent allergic reactions, rashes  PHYSICAL EXAM: Vitals:   08/04/17 1138  BP: 134/78  Pulse: 80  SpO2: 98%   General: No acute distress Head:  Normocephalic/atraumatic Neck: supple, no paraspinal tenderness, full range  of motion Heart:  Regular rate and rhythm Lungs:  Clear to auscultation bilaterally Back: No paraspinal tenderness Skin/Extremities: No rash, no edema Neurological Exam: alert and oriented to person, place, and time. No aphasia or dysarthria. Fund of knowledge is appropriate.  Recent and remote memory are intact.  Attention and concentration are normal.    Able to name objects and repeat phrases. Cranial nerves: Pupils equal, round, reactive to light.  Fundoscopic exam unremarkable, no papilledema. Extraocular movements intact with no nystagmus. Visual fields full. Facial sensation intact. No facial asymmetry. Tongue, uvula, palate midline.  Motor: Bulk and tone normal, muscle strength 5/5 throughout with no pronator drift.  Sensation to light touch intact.  No extinction to double simultaneous stimulation.  Deep tendon reflexes 2+ throughout, toes downgoing.  Finger to nose testing intact.  Gait narrow-based and steady, difficulty with tandem walk.  Romberg negative. No tremor in office today, no cogwheeling. No tremor on handwriting, no micrographia, good spiral drawing (see attached).  IMPRESSION: This is a 47 year old ambidextrous left-hand dominant woman with a history of sarcoidosis (pulmonary and hepatic), diabetes, ventricular tachycardia s/p AICD placement, long Q-T syndrome, with juvenile myoclonic epilepsy and migraines. She reports episodes of blacking out and myoclonic jerks, EEG done at Dr. Garth Bigness office reported generalized 3 Hz polyspike wave discharges consistent with a generalized epilepsy. She is on Lamotrigine 126m BID and Keppra 5041mBID and continues to report occasional myoclonic jerks. Last blackout was in March 2018. She had an improvement in migraines with addition of Topamax, increase to 7550mhs, this may help with tremors as well. Tremors are intermittent, none in office today, possibly benign essential tremor, Topamax may help with this as well. She knows to  minimize Tylenol  intake to 2-2 a week to avoid rebound headaches. She felt addition of Keppra was more helpful, we may taper off Lamotrigine in the future to streamline her medications. She is aware of [place] driving laws to stop driving after a seizure, until 6 months seizure-free. She will follow-up in 5-6 months and knows to call for any changes  Thank you for allowing me to participate in her care.  Please do not hesitate to call for any questions or concerns.  The duration of this appointment visit was 25 minutes of face-to-face time with the patient.  Greater than 50% of this time was spent in counseling, explanation of diagnosis, planning of further management, and coordination of care.   Ellouise Newer, M.D.   CC: Dr. Juleen China

## 2017-08-10 ENCOUNTER — Telehealth: Payer: Self-pay

## 2017-08-10 DIAGNOSIS — D869 Sarcoidosis, unspecified: Secondary | ICD-10-CM

## 2017-08-10 DIAGNOSIS — D8689 Sarcoidosis of other sites: Secondary | ICD-10-CM

## 2017-08-10 DIAGNOSIS — K625 Hemorrhage of anus and rectum: Secondary | ICD-10-CM

## 2017-08-10 NOTE — Telephone Encounter (Signed)
Called pt and left detailed message about labs due and lab hours. Asked her to call me back to confirm understanding and willingness to go to lab this week, or next.

## 2017-08-10 NOTE — Telephone Encounter (Signed)
-----   Message from Roetta Sessions, [place] sent at 04/12/2017  4:15 PM EST ----- Regarding: LFTs due in late March LFTs due in late March 2019.  Last done in November 2018.  Jan can you let this patient know her LFTs are NORMAL. Excellent news. Repeat in 4 months. Thanks

## 2017-08-11 NOTE — Telephone Encounter (Signed)
Left voicemail for patient to call back. 

## 2017-08-13 NOTE — Telephone Encounter (Signed)
Lm for pt

## 2017-08-13 NOTE — Telephone Encounter (Signed)
Sent letter to pt regarding labs due.

## 2017-08-17 NOTE — Telephone Encounter (Signed)
Pt called back. She will go to the lab this week.  She is not feeling well.  Wondered if the hiatal hernia she has is getting worse (bigger?) and might want to come in for an appt.  She will see how her labs come back and will decide to perhaps see an APP.

## 2017-08-18 ENCOUNTER — Other Ambulatory Visit (INDEPENDENT_AMBULATORY_CARE_PROVIDER_SITE_OTHER): Payer: Medicaid Other

## 2017-08-18 DIAGNOSIS — K625 Hemorrhage of anus and rectum: Secondary | ICD-10-CM

## 2017-08-18 DIAGNOSIS — D8689 Sarcoidosis of other sites: Secondary | ICD-10-CM

## 2017-08-18 DIAGNOSIS — D869 Sarcoidosis, unspecified: Secondary | ICD-10-CM | POA: Diagnosis not present

## 2017-08-18 LAB — HEPATIC FUNCTION PANEL
ALT: 18 U/L (ref 0–35)
AST: 20 U/L (ref 0–37)
Albumin: 3.8 g/dL (ref 3.5–5.2)
Alkaline Phosphatase: 107 U/L (ref 39–117)
BILIRUBIN TOTAL: 1 mg/dL (ref 0.2–1.2)
Bilirubin, Direct: 0.2 mg/dL (ref 0.0–0.3)
Total Protein: 7.4 g/dL (ref 6.0–8.3)

## 2017-09-09 ENCOUNTER — Encounter: Payer: Self-pay | Admitting: Internal Medicine

## 2017-09-20 ENCOUNTER — Ambulatory Visit (INDEPENDENT_AMBULATORY_CARE_PROVIDER_SITE_OTHER): Payer: Medicaid Other | Admitting: *Deleted

## 2017-09-20 DIAGNOSIS — I4581 Long QT syndrome: Secondary | ICD-10-CM | POA: Diagnosis not present

## 2017-09-20 NOTE — Progress Notes (Signed)
Remote ICD transmission.   

## 2017-09-22 ENCOUNTER — Encounter: Payer: Self-pay | Admitting: Cardiology

## 2017-10-05 LAB — CUP PACEART REMOTE DEVICE CHECK
Battery Remaining Longevity: 120 mo
Battery Voltage: 3.03 V
Brady Statistic RV Percent Paced: 0.01 %
Date Time Interrogation Session: 20190506182249
HIGH POWER IMPEDANCE MEASURED VALUE: 84 Ohm
HighPow Impedance: 51 Ohm
Implantable Lead Location: 753860
Implantable Pulse Generator Implant Date: 20161117
Lead Channel Impedance Value: 380 Ohm
Lead Channel Impedance Value: 437 Ohm
Lead Channel Pacing Threshold Amplitude: 1.25 V
Lead Channel Sensing Intrinsic Amplitude: 4.375 mV
Lead Channel Sensing Intrinsic Amplitude: 4.375 mV
Lead Channel Setting Pacing Amplitude: 3 V
MDC IDC LEAD IMPLANT DT: 20080828
MDC IDC MSMT LEADCHNL RV PACING THRESHOLD PULSEWIDTH: 0.4 ms
MDC IDC SET LEADCHNL RV PACING PULSEWIDTH: 0.4 ms
MDC IDC SET LEADCHNL RV SENSING SENSITIVITY: 0.3 mV

## 2017-11-04 ENCOUNTER — Other Ambulatory Visit: Payer: Self-pay | Admitting: Internal Medicine

## 2017-12-20 ENCOUNTER — Encounter: Payer: Medicaid Other | Admitting: *Deleted

## 2017-12-22 ENCOUNTER — Telehealth: Payer: Self-pay

## 2017-12-22 NOTE — Telephone Encounter (Signed)
LMOVM reminding pt to send remote transmission.   

## 2017-12-28 ENCOUNTER — Encounter: Payer: Self-pay | Admitting: Cardiology

## 2018-01-04 ENCOUNTER — Ambulatory Visit (INDEPENDENT_AMBULATORY_CARE_PROVIDER_SITE_OTHER): Payer: Medicaid Other | Admitting: *Deleted

## 2018-01-04 DIAGNOSIS — I4581 Long QT syndrome: Secondary | ICD-10-CM

## 2018-01-04 NOTE — Progress Notes (Signed)
Remote ICD transmission.   

## 2018-01-07 ENCOUNTER — Other Ambulatory Visit: Payer: Self-pay

## 2018-01-07 ENCOUNTER — Telehealth: Payer: Self-pay | Admitting: Gastroenterology

## 2018-01-07 DIAGNOSIS — R197 Diarrhea, unspecified: Secondary | ICD-10-CM

## 2018-01-07 MED ORDER — LINACLOTIDE 290 MCG PO CAPS: 290.0000 ug | ORAL_CAPSULE | Freq: Every day | ORAL | 3 refills | Status: DC

## 2018-01-07 NOTE — Telephone Encounter (Signed)
Patient states that since early this morning she has been having lower abdominal pain, diarrhea mixed with mucus and blood. Upper abdominal bloating, some nausea. No fever. Patient states she needs a refill on her Linzess 290 mcg too.

## 2018-01-07 NOTE — Telephone Encounter (Signed)
[name] if this is acute onset diarrhea with mucous / blood, would send GI pathogen panel to evaluate for infection. If she has worsening symptoms over the weekend she will need to seek evaluation. Would hold off on immodium until the stool study comes back. Colonoscopy done in December showed a few polyps but nothing concerning. We can refill the Linzess but would hold it while she is having the diarrhea.

## 2018-01-07 NOTE — Telephone Encounter (Signed)
Patient advised of recommendations, Rx refill sent in. Advised to go to the UC or ED if worsening symptoms.

## 2018-01-10 ENCOUNTER — Other Ambulatory Visit: Payer: Medicaid Other

## 2018-01-12 ENCOUNTER — Other Ambulatory Visit: Payer: Medicaid Other

## 2018-01-12 DIAGNOSIS — R197 Diarrhea, unspecified: Secondary | ICD-10-CM | POA: Diagnosis not present

## 2018-01-13 LAB — GASTROINTESTINAL PATHOGEN PANEL PCR
C. difficile Tox A/B, PCR: NOT DETECTED
Campylobacter, PCR: NOT DETECTED
Cryptosporidium, PCR: NOT DETECTED
E COLI (ETEC) LT/ST, PCR: NOT DETECTED
E coli (STEC) stx1/stx2, PCR: NOT DETECTED
E coli 0157, PCR: NOT DETECTED
GIARDIA LAMBLIA, PCR: NOT DETECTED
NOROVIRUS, PCR: NOT DETECTED
Rotavirus A, PCR: NOT DETECTED
Salmonella, PCR: NOT DETECTED
Shigella, PCR: NOT DETECTED

## 2018-02-08 LAB — CUP PACEART REMOTE DEVICE CHECK
Battery Voltage: 3.03 V
Brady Statistic RV Percent Paced: 0.01 %
Date Time Interrogation Session: 20190820144508
HIGH POWER IMPEDANCE MEASURED VALUE: 53 Ohm
HIGH POWER IMPEDANCE MEASURED VALUE: 85 Ohm
Implantable Lead Location: 753860
Lead Channel Impedance Value: 380 Ohm
Lead Channel Impedance Value: 399 Ohm
Lead Channel Sensing Intrinsic Amplitude: 5.5 mV
Lead Channel Sensing Intrinsic Amplitude: 5.5 mV
MDC IDC LEAD IMPLANT DT: 20080828
MDC IDC MSMT BATTERY REMAINING LONGEVITY: 118 mo
MDC IDC MSMT LEADCHNL RV PACING THRESHOLD AMPLITUDE: 1.5 V
MDC IDC MSMT LEADCHNL RV PACING THRESHOLD PULSEWIDTH: 0.4 ms
MDC IDC PG IMPLANT DT: 20161117
MDC IDC SET LEADCHNL RV PACING AMPLITUDE: 3 V
MDC IDC SET LEADCHNL RV PACING PULSEWIDTH: 0.4 ms
MDC IDC SET LEADCHNL RV SENSING SENSITIVITY: 0.3 mV

## 2018-02-15 ENCOUNTER — Other Ambulatory Visit: Payer: Self-pay | Admitting: *Deleted

## 2018-02-15 MED ORDER — INSULIN GLARGINE 100 UNIT/ML SOLOSTAR PEN: 40.0000 [IU] | PEN_INJECTOR | Freq: Every day | SUBCUTANEOUS | 5 refills | Status: DC

## 2018-02-15 MED ORDER — NOVOLOG FLEXPEN 100 UNIT/ML ~~LOC~~ SOPN: PEN_INJECTOR | SUBCUTANEOUS | 0 refills | Status: DC

## 2018-02-17 ENCOUNTER — Other Ambulatory Visit: Payer: Self-pay

## 2018-02-17 ENCOUNTER — Telehealth: Payer: Self-pay

## 2018-02-17 ENCOUNTER — Encounter: Payer: Self-pay | Admitting: Neurology

## 2018-02-17 ENCOUNTER — Ambulatory Visit: Payer: Medicaid Other | Admitting: Neurology

## 2018-02-17 VITALS — BP 132/78 | HR 77 | Ht 66.0 in | Wt 178.0 lb

## 2018-02-17 DIAGNOSIS — G40B09 Juvenile myoclonic epilepsy, not intractable, without status epilepticus: Secondary | ICD-10-CM | POA: Diagnosis not present

## 2018-02-17 DIAGNOSIS — G43009 Migraine without aura, not intractable, without status migrainosus: Secondary | ICD-10-CM

## 2018-02-17 DIAGNOSIS — D869 Sarcoidosis, unspecified: Secondary | ICD-10-CM

## 2018-02-17 DIAGNOSIS — D8689 Sarcoidosis of other sites: Secondary | ICD-10-CM

## 2018-02-17 MED ORDER — TOPIRAMATE 100 MG PO TABS: 100.0000 mg | ORAL_TABLET | Freq: Two times a day (BID) | ORAL | 3 refills | Status: DC

## 2018-02-17 MED ORDER — LEVETIRACETAM 500 MG PO TABS: 500.0000 mg | ORAL_TABLET | Freq: Two times a day (BID) | ORAL | 3 refills | Status: DC

## 2018-02-17 MED ORDER — LAMOTRIGINE 150 MG PO TABS: 150.0000 mg | ORAL_TABLET | Freq: Two times a day (BID) | ORAL | 5 refills | Status: DC

## 2018-02-17 NOTE — Progress Notes (Signed)
NEUROLOGY FOLLOW UP OFFICE NOTE  ARVADA SEABORN [id number] 04/09/1971  HISTORY OF PRESENT ILLNESS: I had the pleasure of seeing [name] in follow-up in the neurology clinic on 02/17/2018.  The patient was last seen 46 months ago for juvenile myoclonic epilepsy and frequent headaches. She had good response to addition of Topamax, currently on 57m qhs, reporting 1-2 headaches a month with associated nausea. She had been taking Lamotrigine 158mBID and Keppra 50012mID for seizures. She denies any blackouts since March 2018, she has occasional myoclonic jerks but feels that "things are going pretty smoothly." She notices occasional twitch of her right index finger. She denies any staring/unresponsive episodes, gaps in time, focal numbness/tingling/weakness. She is also on gabapentin 300m7mD for "nerve damage, neuropathy, fibromyalgia." She ran out of clonazepam for anxiety. She denies any falls.  History on Initial Assessment 11/05/2016: This is a 47 y99r old ambidextrous left-hand dominant woman with a history of sarcoidosis (pulmonary and hepatic), diabetes, ventricular tachycardia s/p AICD placement, long Q-T syndrome, juvenile myoclonic epilepsy, with seizures and headaches. She had been seeing neurologist Dr. SateFelecia Shelling states that she grew up never treated for seizures but from time to time was told she had symptoms that sounded like seizure. She started seeing Neurology in her late 20s 74sn she had episodes where she would briefly blackout and become confused, not knowing what was going on. She would have quick body jerks but no convulsive activity. She was taking clonazepam at that time but was not on medication when she started seeing Dr. SateFelecia ShellingOctober 2017. An EEG done 03/20/2016 showed several 3-6 second bursts of generalized 3 Hz polyspike wave activity. There were also some temporal sharp waves seen. EEG reported as consistent with juvenile myoclonic epilepsy. She was started on  Lamotrigine in November 2017, increased to 100mg12m. Clonazepam 0.5mg B69mwas added in November 2017, but per records it is prn for anxiety. Keppra 500mg B86mas added in March 2018 for seizure and headache prophylaxis. She reports last blackout episode was in March 2018. She continues to have myoclonic jerks a few times a week affecting her arms. They can occur anytime during the day. She denies any convulsions but has woken up with her tongue bitten and her bed wet. Last episode of waking up with tongue bite was last week. She reports a history of bad headaches since elementary school, but now she feels her head is full with constant 4/10 pain waxing and waning in severity to 10/10 3-4 times a month. Pain is usually on the left parietal or vertex region, sometimes with pressure or throbbing on the left side. She has nausea and vomiting, occasional photo and phonophobia. She takes Tylenol on occasion, a small bottle lasts her a month, but recently she has not been taking anything. Occipital nerve block did not help in the past. She usually gets 3-4 hours of sleep due to chronic neck and back pain. She reports her mind is always wandering. She has a history of fibromyalgia and her body is always sore. She has diabetic neuropathy, and reports that there is always some tingling or numbness somewhere in her arms and legs. She has occasional constipation. She reports a fall last Wednesday. She denies any significant head injuries. She has been taking gabapentin for neuropathy for the past 4-5 years. She has occasional anxiety. She feels like her left leg drags more. She had seen ophthalmologist Dr. ChristoTama Highs told there was "increased pressure,"  records will be requested for review. She denies any olfactory/gustatory hallucinations, deja vu, rising epigastric sensation. Her mother had migraines, maternal grandfather had migraines.   Epilepsy Risk Factors:  There is a strong family history of seizures  in her maternal grandfather, maternal uncles. She had a normal birth and early development.  There is no history of febrile convulsions, CNS infections such as meningitis/encephalitis, significant traumatic brain injury, neurosurgical procedures.   I personally reviewed head CT without contrast done 11/2014 which did not show any acute changes.   PAST MEDICAL HISTORY: Past Medical History:  Diagnosis Date  . Abdominal pain, periumbilic   . Allergic rhinitis   . Allergy   . Anemia   . Anxiety   . Arthritis   . Asthma   . Automatic implantable cardioverter-defibrillator in situ 2006, replaced 2008   [employer]  . Bronchitis   . Cancer ([place])    Nodules in lungs pt believes were cancerous pt unsure  . COPD (chronic obstructive pulmonary disease) ([place])   . DM (diabetes mellitus) ([place]) 1997/1998   type 1, initially gestational but soon after child birth developed IDDM  . Esophageal stricture 2005/2009   esophageal strictures dilated 2005, 2009  . Eye hemorrhage    bilateral  . Fibroid, uterine   . Fibromyalgia   . GERD (gastroesophageal reflux disease) 2005  . History of blood transfusion years ago  . Hypertension   . Internal hemorrhoids 2010   2011 band ligation of int rrhoids.  . Long Q-T syndrome   . Neuropathy in diabetes ([place])   . Palpitation   . Sarcoidosis   . Seizure disorder ([place])    none recently  . Stroke ([place])    QUESTIONABLE TIA     MEDICATIONS: Current Outpatient Medications on File Prior to Visit  Medication Sig Dispense Refill  . ACCU-CHEK SOFTCLIX LANCETS lancets Use as instructed 100 each 12  . acetaminophen (TYLENOL) 500 MG tablet Take 1,000 mg by mouth every 6 (six) hours as needed for mild pain or fever. Reported on 07/22/2015    . beclomethasone (QVAR) 80 MCG/ACT inhaler Inhale 2 puffs into the lungs 2 (two) times daily. 1 Inhaler 5  . Blood Glucose Monitoring Suppl (ACCU-CHEK AVIVA PLUS) w/Device KIT 1 Device by Does not apply route daily. 1 kit 0  .  clonazePAM (KLONOPIN) 0.5 MG tablet Take 1 tablet (0.5 mg total) by mouth 2 (two) times daily as needed for anxiety. 60 tablet 3  . cyclobenzaprine (FLEXERIL) 10 MG tablet Take 1 tablet (10 mg total) by mouth 3 (three) times daily as needed for muscle spasms. 30 tablet 0  . cycloSPORINE (RESTASIS) 0.05 % ophthalmic emulsion Place 1 drop daily into both eyes.    . fluticasone (FLONASE) 50 MCG/ACT nasal spray Place 2 sprays into both nostrils daily as needed for allergies.     Marland Kitchen gabapentin (NEURONTIN) 300 MG capsule Take 1 capsule (300 mg total) by mouth 3 (three) times daily. 90 capsule 2  . glucose blood (ACCU-CHEK AVIVA PLUS) test strip Use as instructed 100 each 12  . Insulin Glargine (LANTUS SOLOSTAR) 100 UNIT/ML Solostar Pen Inject 40 Units into the skin at bedtime. 30 mL 5  . Insulin Pen Needle 31G X 8 MM MISC BD UltraFine III Pen Needles. For use with insulin pen device. Inject insulin 6 x daily 200 each 3  . lactulose (CHRONULAC) 10 GM/15ML solution Take 30 ml TID 2700 mL 2  . lamoTRIgine (LAMICTAL) 150 MG tablet Take 1 tablet (150  mg total) by mouth 2 (two) times daily. 60 tablet 5  . Lancet Devices (ACCU-CHEK SOFTCLIX) lancets Use as instructed 1 each 0  . levETIRAcetam (KEPPRA) 500 MG tablet Take 1 tablet (500 mg total) by mouth 2 (two) times daily. 60 tablet 5  . linaclotide (LINZESS) 290 MCG CAPS capsule Take 1 capsule (290 mcg total) by mouth daily before breakfast. 30 capsule 3  . meclizine (ANTIVERT) 32 MG tablet Take 1 tablet (32 mg total) by mouth 3 (three) times daily as needed. 30 tablet 0  . naproxen (NAPROSYN) 375 MG tablet Take 1 tablet (375 mg total) by mouth 2 (two) times daily with a meal. 60 tablet 3  . NOVOLOG FLEXPEN 100 UNIT/ML FlexPen INJECT 4 TO 8 UNITS INTO THE SKIN 3 TIMES DAILY 15 mL 0  . Olopatadine HCl 0.2 % SOLN Place 1 drop as needed into both eyes.    Marland Kitchen ondansetron (ZOFRAN) 4 MG tablet Take 4 mg as needed by mouth.    . pantoprazole (PROTONIX) 40 MG tablet  Take 1 tablet (40 mg total) by mouth daily at 6 (six) AM. 30 tablet 2  . potassium chloride SA (K-DUR,KLOR-CON) 20 MEQ tablet take 1 tablet by mouth once daily 30 tablet 2  . predniSONE (DELTASONE) 5 MG tablet Take by mouth daily with breakfast. 7.101m daily    . prenatal vitamin w/FE, FA (PRENATAL 1 + 1) 27-1 MG TABS tablet Take 1 tablet by mouth daily at 12 noon. 30 each 12  . propranolol (INDERAL) 80 MG tablet Take 1 tablet (80 mg total) 2 (two) times daily by mouth. 30 tablet 11  . topiramate (TOPAMAX) 25 MG tablet Take 3 tabs at night 90 tablet 6   No current facility-administered medications on file prior to visit.     ALLERGIES: Allergies  Allergen Reactions  . Contrast Media [Iodinated Diagnostic Agents] Anaphylaxis and Shortness Of Breath    Needed to be defibrillated and patient was pre-medicated with radiology 13 hour premeds.   . Augmentin [Amoxicillin-Pot Clavulanate] Other (See Comments)    "stomach hurt"  . Codeine Nausea And Vomiting    jittery  . Demerol [Meperidine]   . Iohexol      Code: RASH, Desc: PT STATES SHE IS ALLERGIC TO IV CONTRAST 05/28/06/RM, Onset Date: [id number]  . Morphine And Related Nausea And Vomiting    FAMILY HISTORY: Family History  Problem Relation Age of Onset  . Heart attack Mother   . Lung cancer Maternal Uncle        x2  . Breast cancer Maternal Aunt        x2  . Stomach cancer Paternal Uncle   . Stomach cancer Paternal Grandfather   . Stomach cancer Paternal Grandmother   . Diabetes Father   . Positive PPD/TB Exposure Father   . Glaucoma Father   . Esophageal cancer Paternal Aunt   . Colon cancer Maternal Grandfather     SOCIAL HISTORY: Social History   Socioeconomic History  . Marital status: Single    Spouse name: Not on file  . Number of children: Not on file  . Years of education: Not on file  . Highest education level: Not on file  Occupational History  . Not on file  Social Needs  . Financial resource strain: Not  on file  . Food insecurity:    Worry: Not on file    Inability: Not on file  . Transportation needs:    Medical: Not on file  Non-medical: Not on file  Tobacco Use  . Smoking status: Never Smoker  . Smokeless tobacco: Never Used  Substance and Sexual Activity  . Alcohol use: Yes    Alcohol/week: 0.0 standard drinks    Comment: None since 2015-was occasional  . Drug use: No  . Sexual activity: Yes    Birth control/protection: Condom  Lifestyle  . Physical activity:    Days per week: Not on file    Minutes per session: Not on file  . Stress: Not on file  Relationships  . Social connections:    Talks on phone: Not on file    Gets together: Not on file    Attends religious service: Not on file    Active member of club or organization: Not on file    Attends meetings of clubs or organizations: Not on file    Relationship status: Not on file  . Intimate partner violence:    Fear of current or ex partner: Not on file    Emotionally abused: Not on file    Physically abused: Not on file    Forced sexual activity: Not on file  Other Topics Concern  . Not on file  Social History Narrative  . Not on file    REVIEW OF SYSTEMS: Constitutional: No fevers, chills, or sweats, no generalized fatigue, change in appetite Eyes: No visual changes, double vision, eye pain Ear, nose and throat: No hearing loss, ear pain, nasal congestion, sore throat Cardiovascular: No chest pain, palpitations Respiratory:  No shortness of breath at rest or with exertion, wheezes GastrointestinaI: No nausea, vomiting, diarrhea, abdominal pain, fecal incontinence Genitourinary:  No dysuria, urinary retention or frequency Musculoskeletal:  + neck pain, back pain Integumentary: No rash, pruritus, skin lesions Neurological: as above Psychiatric: No depression, insomnia, anxiety Endocrine: No palpitations, fatigue, diaphoresis, mood swings, change in appetite, change in weight, increased  thirst Hematologic/Lymphatic:  No anemia, purpura, petechiae. Allergic/Immunologic: no itchy/runny eyes, nasal congestion, recent allergic reactions, rashes  PHYSICAL EXAM: Vitals:   02/17/18 0945  BP: 132/78  Pulse: 77  SpO2: 98%   General: No acute distress Head:  Normocephalic/atraumatic Neck: supple, no paraspinal tenderness, full range of motion Heart:  Regular rate and rhythm Lungs:  Clear to auscultation bilaterally Back: No paraspinal tenderness Skin/Extremities: No rash, no edema Neurological Exam: alert and oriented to person, place, and time. No aphasia or dysarthria. Fund of knowledge is appropriate.  Recent and remote memory are intact.  Attention and concentration are normal.    Able to name objects and repeat phrases. Cranial nerves: Pupils equal, round, reactive to light.  Extraocular movements intact with no nystagmus. Visual fields full. Facial sensation intact. No facial asymmetry. Tongue, uvula, palate midline.  Motor: Bulk and tone normal, muscle strength 5/5 throughout with no pronator drift.  Sensation to light touch intact.  No extinction to double simultaneous stimulation.  Finger to nose testing intact.  Gait narrow-based and steady, mild difficulty with tandem walk.  Romberg negative. No tremor today  IMPRESSION: This is a 47 year old ambidextrous left-hand dominant woman with a history of sarcoidosis (pulmonary and hepatic), diabetes, ventricular tachycardia s/p AICD placement, long Q-T syndrome, with juvenile myoclonic epilepsy and migraines. She reports episodes of blacking out and myoclonic jerks, EEG done at Dr. Garth Bigness office reported generalized 3 Hz polyspike wave discharges consistent with a generalized epilepsy. She had improvement in migraines with addition of Topamax, she is also on Lamotrigine 164m BID and Keppra 5037mBID. She is on  multiple medications, including gabapentin for neuropathy/fibromyalgia (this can worsen seizures in some patients with  primary generalized epilepsy, continue to monitor) and prn clonazepam. We discussed streamlining medications to reduce polypharmacy, she will increase Topamax to 117m qhs x 1 week, then to 1044mBID. After 2 weeks of increasing Topamax, she will start weaning off Lamictal by 1/2 tablet every week. Continue Keppra 50023mID. We discussed risks of breakthrough seizure with any medication adjustment, she knows to call for any changes. She is aware of [place] driving laws to stop driving after a seizure, until 6 months seizure-free. She will follow-up in 44 months and knows to call for any changes  Thank you for allowing me to participate in her care.  Please do not hesitate to call for any questions or concerns.  The duration of this appointment visit was 36m76mes of face-to-face time with the patient.  Greater than 50% of this time was spent in counseling, explanation of diagnosis, planning of further management, and coordination of care.   KareEllouise NewerD.   CC: Dr. BlanCriss Rosales

## 2018-02-17 NOTE — Telephone Encounter (Signed)
Letter sent to pt: Lab - AFP- due in October

## 2018-02-17 NOTE — Telephone Encounter (Signed)
-----   Message from Roetta Sessions, [place] sent at 08/18/2017  4:03 PM EDT ----- Regarding: LFTs due  LFTs due. DX Sarcoidosis with granulomatous hepatitis?

## 2018-02-17 NOTE — Patient Instructions (Signed)
1. Increase Topamax dose, we will change to Topamax 100mg  tablet: take 1 tablet every night for 1 week, then increase to 1 tablet twice a day  2. Continue Lamictal 150mg  twice a day, then 2 weeks after taking the Topamax twice a day, start weaning down Lamictal to 1/2 tablet in AM, 1 tablet in PM for 1 week, then reduce to 1/2 tablet twice a day for 1 week, then reduce to 1/2 tablet at night for a week, then stop  3. Continue Keppra 500mg  twice a day  4. Follow-up in 4 months, call for any changes  Seizure Precautions: 1. If medication has been prescribed for you to prevent seizures, take it exactly as directed.  Do not stop taking the medicine without talking to your doctor first, even if you have not had a seizure in a long time.   2. Avoid activities in which a seizure would cause danger to yourself or to others.  Don't operate dangerous machinery, swim alone, or climb in high or dangerous places, such as on ladders, roofs, or girders.  Do not drive unless your doctor says you may.  3. If you have any warning that you may have a seizure, lay down in a safe place where you can't hurt yourself.    4.  No driving for 6 months from last seizure, as per [facility].   Please refer to the following link on the [place] website for more information: http://www.epilepsyfoundation.org/answerplace/Social/driving/drivingu.cfm   5.  Maintain good sleep hygiene. Avoid alcohol   6.  Notify your neurology if you are planning pregnancy or if you become pregnant.  7.  Contact your doctor if you have any problems that may be related to the medicine you are taking.  8.  Call 911 and bring the patient back to the ED if:        A.  The seizure lasts longer than 5 minutes.       B.  The patient doesn't awaken shortly after the seizure  C.  The patient has new problems such as difficulty seeing, speaking or moving  D.  The patient was injured during the seizure  E.  The  patient has a temperature over 102 F (39C)  F.  The patient vomited and now is having trouble breathing

## 2018-02-21 ENCOUNTER — Ambulatory Visit: Payer: Medicaid Other | Admitting: Pharmacist

## 2018-02-21 ENCOUNTER — Telehealth: Payer: Self-pay

## 2018-02-21 ENCOUNTER — Encounter: Payer: Self-pay | Admitting: Pharmacist

## 2018-02-21 DIAGNOSIS — Z794 Long term (current) use of insulin: Secondary | ICD-10-CM | POA: Diagnosis not present

## 2018-02-21 DIAGNOSIS — E119 Type 2 diabetes mellitus without complications: Secondary | ICD-10-CM

## 2018-02-21 MED ORDER — GLUCOSE BLOOD VI STRP: ORAL_STRIP | 12 refills | Status: DC

## 2018-02-21 MED ORDER — ACCU-CHEK GUIDE W/DEVICE KIT: 1.0000 | PACK | Freq: Once | 0 refills | Status: AC

## 2018-02-21 MED ORDER — NOVOLOG FLEXPEN 100 UNIT/ML ~~LOC~~ SOPN: PEN_INJECTOR | SUBCUTANEOUS | 0 refills | Status: DC

## 2018-02-21 MED ORDER — ACCU-CHEK AVIVA PLUS W/DEVICE KIT: 1.0000 | PACK | Freq: Every day | 0 refills | Status: DC

## 2018-02-21 MED ORDER — INSULIN DEGLUDEC 200 UNIT/ML ~~LOC~~ SOPN: 30.0000 [IU] | PEN_INJECTOR | Freq: Every day | SUBCUTANEOUS | 0 refills | Status: DC

## 2018-02-21 NOTE — Patient Instructions (Signed)
Great to see you today.   Please STOP your Lantus   Start Tresiba 30 units in the moring, increase by 1 unit per day if blood sugar.   Take 5 units of NOVOLOG with each of your TWO large meals per day.   Follow up with Dr. Criss Rosales. Then f/u with pharmacy clinic in 4-6 weeks.

## 2018-02-21 NOTE — Assessment & Plan Note (Signed)
Diabetes longstanding for 20 years. Patient is adherent with medication. Control is suboptimal due to non-optimal insulin regimen.  A1C deferred today due to lack of control (excessive nocturia).  D/C Lantus due to increased itching with use.  -Initiated a trial of basal insulin Tresiba (insulin degludec). Patient will continue to titrate 1 unit every day if fasting CBGs > 100mg /dl until fasting CBGs reach goal or next visit.  -Increased dose of  Rapid Novolog (insulin asapart) to 5 units/meal x2 a day.  -Extensively discussed pathophysiology of DM, recommended lifestyle interventions, dietary effects on glycemic control -Counseled on s/sx of and management of hypoglycemia Alternative agents and combination therapy to be revisited at next PCP and Rx clinic visits.

## 2018-02-21 NOTE — Telephone Encounter (Signed)
Pharmacist called nurse line stating the Accu Chek Aviva Plus is coming off the market and she would like to go ahead and switch to [place]. Also, please write directions for use, "use as instructed," will not be covered by insurance.  Will forward to [facility] who saw the patient today and PCP.

## 2018-02-21 NOTE — Progress Notes (Signed)
    S:     Chief Complaint  Patient presents with  . Medication Management    diabetes    Patient arrives appearing well and not in distress.  Presents for diabetes evaluation, education, and management at the request of PCP, Dr Juleen China.  Patient was last seen by Primary Care Provider on 07/21/2017.   Patient reports Diabetes was diagnosed in 1997.   Insurance coverage/medication affordability: unknown  Patient reports adherence with medications but taking differently than prescribed. Only taking 2 units of Novolog and also taking 35 or 40 units of Lantus QPM (dose based on itching??).   Patient denies hypoglycemic events, reports knowing what to do if hypoglycemic.  Patient reported dietary habits: Eats 2 meals/day with snacks  Patient-reported exercise habits: unknown but reports gym membership   Patient denies neuropathy. Patient reports visual changes but only when hyperglycemic. Patient denies self foot exams.    O:  Physical Exam  Constitutional: She appears well-developed and well-nourished.  Skin: Skin is warm and dry.  Psychiatric: She has a normal mood and affect.  Vitals reviewed.    Review of Systems  Genitourinary: Positive for frequency.       4-5x nocturia reported.  All other systems reviewed and are negative.    Lab Results  Component Value Date   HGBA1C 11 07/08/2016   Vitals:   02/21/18 1102  BP: 128/76  Pulse: 76  SpO2: 98%    Lipid Panel     Component Value Date/Time   CHOL 166 03/29/2015 1209   TRIG 236 (H) 12/24/2014 1533   HDL 14 (L) 12/24/2014 1533   CHOLHDL 8.7 (H) 12/24/2014 1533   VLDL 47 (H) 12/24/2014 1533   LDLCALC 61 12/24/2014 1533    A/P: Diabetes longstanding for 20 years. Patient is adherent with medication. Control is suboptimal due to non-optimal insulin regimen.  A1C deferred today due to lack of control (excessive nocturia).  D/C Lantus due to increased itching with use.  -Initiated a trial of basal insulin  Tresiba (insulin degludec). Patient will continue to titrate 1 unit every day if fasting CBGs > 100mg /dl until fasting CBGs reach goal or next visit.  -Increased dose of  Rapid Novolog (insulin asapart) to 5 units/meal x2 a day.  -Extensively discussed pathophysiology of DM, recommended lifestyle interventions, dietary effects on glycemic control -Counseled on s/sx of and management of hypoglycemia Alternative agents and combination therapy to be revisited at next PCP and Rx clinic visits.   Written patient instructions provided.  Total time in face to face counseling 30 minutes.   Follow up Pharmacist/PCP Sherene Sires, DO Clinic Visit in 2 weeks.    Patient seen with Andee Poles, PharmD Candidate.

## 2018-03-08 ENCOUNTER — Ambulatory Visit (INDEPENDENT_AMBULATORY_CARE_PROVIDER_SITE_OTHER): Payer: Medicaid Other | Admitting: Family Medicine

## 2018-03-08 ENCOUNTER — Other Ambulatory Visit: Payer: Self-pay

## 2018-03-08 ENCOUNTER — Encounter: Payer: Self-pay | Admitting: Family Medicine

## 2018-03-08 VITALS — BP 126/80 | HR 61 | Temp 98.3°F | Ht 67.0 in | Wt 178.4 lb

## 2018-03-08 DIAGNOSIS — R9431 Abnormal electrocardiogram [ECG] [EKG]: Secondary | ICD-10-CM

## 2018-03-08 DIAGNOSIS — E119 Type 2 diabetes mellitus without complications: Secondary | ICD-10-CM | POA: Diagnosis not present

## 2018-03-08 DIAGNOSIS — M545 Low back pain: Secondary | ICD-10-CM | POA: Diagnosis not present

## 2018-03-08 DIAGNOSIS — R110 Nausea: Secondary | ICD-10-CM

## 2018-03-08 DIAGNOSIS — Z794 Long term (current) use of insulin: Secondary | ICD-10-CM | POA: Diagnosis not present

## 2018-03-08 DIAGNOSIS — K5900 Constipation, unspecified: Secondary | ICD-10-CM

## 2018-03-08 DIAGNOSIS — F411 Generalized anxiety disorder: Secondary | ICD-10-CM | POA: Diagnosis not present

## 2018-03-08 DIAGNOSIS — J449 Chronic obstructive pulmonary disease, unspecified: Secondary | ICD-10-CM | POA: Diagnosis not present

## 2018-03-08 DIAGNOSIS — K219 Gastro-esophageal reflux disease without esophagitis: Secondary | ICD-10-CM | POA: Diagnosis not present

## 2018-03-08 DIAGNOSIS — G8929 Other chronic pain: Secondary | ICD-10-CM

## 2018-03-08 LAB — POCT GLYCOSYLATED HEMOGLOBIN (HGB A1C): HBA1C, POC (CONTROLLED DIABETIC RANGE): 11.1 % — AB (ref 0.0–7.0)

## 2018-03-08 MED ORDER — POTASSIUM CHLORIDE CRYS ER 20 MEQ PO TBCR: 20.0000 meq | EXTENDED_RELEASE_TABLET | Freq: Every day | ORAL | 2 refills | Status: DC

## 2018-03-08 MED ORDER — NAPROXEN 375 MG PO TABS: 375.0000 mg | ORAL_TABLET | Freq: Two times a day (BID) | ORAL | 3 refills | Status: DC

## 2018-03-08 MED ORDER — INSULIN DEGLUDEC 200 UNIT/ML ~~LOC~~ SOPN: 40.0000 [IU] | PEN_INJECTOR | Freq: Every day | SUBCUTANEOUS | 2 refills | Status: DC

## 2018-03-08 MED ORDER — LIRAGLUTIDE 18 MG/3ML ~~LOC~~ SOPN: 0.6000 mg | PEN_INJECTOR | Freq: Every morning | SUBCUTANEOUS | 0 refills | Status: DC

## 2018-03-08 MED ORDER — CLONAZEPAM 0.5 MG PO TABS: 0.5000 mg | ORAL_TABLET | ORAL | 0 refills | Status: DC | PRN

## 2018-03-08 MED ORDER — LINACLOTIDE 290 MCG PO CAPS: 290.0000 ug | ORAL_CAPSULE | Freq: Every day | ORAL | 3 refills | Status: DC

## 2018-03-08 MED ORDER — BACLOFEN 10 MG PO TABS: 10.0000 mg | ORAL_TABLET | Freq: Two times a day (BID) | ORAL | 0 refills | Status: DC

## 2018-03-08 MED ORDER — PANTOPRAZOLE SODIUM 40 MG PO TBEC: 40.0000 mg | DELAYED_RELEASE_TABLET | Freq: Every day | ORAL | 2 refills | Status: DC

## 2018-03-08 MED ORDER — BECLOMETHASONE DIPROPIONATE 80 MCG/ACT IN AERS: 2.0000 | INHALATION_SPRAY | Freq: Two times a day (BID) | RESPIRATORY_TRACT | 5 refills | Status: DC

## 2018-03-09 ENCOUNTER — Telehealth: Payer: Self-pay | Admitting: *Deleted

## 2018-03-09 LAB — MICROALBUMIN / CREATININE URINE RATIO
Creatinine, Urine: 82.6 mg/dL
Microalb/Creat Ratio: 18.9 mg/g creat (ref 0.0–30.0)
Microalbumin, Urine: 15.6 ug/mL

## 2018-03-09 NOTE — Telephone Encounter (Signed)
Completed PA info in [place] for Victoza.  Status pending.  Will recheck status in 24 hours. Roger Kettles, Salome Spotted, CMA

## 2018-03-09 NOTE — Telephone Encounter (Signed)
Prior approval for Victoza completed via [employer].  Med approved for 03/09/2018 - 04/08/2018, only approved for 30 days because provider may change after this script.   [place] pharmacy informed.  Philomina Leon, Salome Spotted, CMA

## 2018-03-10 ENCOUNTER — Encounter: Payer: Self-pay | Admitting: Family Medicine

## 2018-03-10 NOTE — Assessment & Plan Note (Addendum)
Had itching on lantus so was switched to tresibe by pharmacy at last visit and that has resolved.  We titrating up due to continued hyperglycemia but stop going up because her outpatient pharmacy wouldn't refill her tresiba and she was trying to "stretch" the pen.  41u daily was still producing fasting cbgs ~300 per patient with 5u novolog TID w/ meals.  New plan: increase tresiba to 51u daily, continue to titrate up by 1u daily if fasting CBG>100 Continue novolog 5u w/ meals TID Start victoza 0.6 daily Has not tolerated metformin in past Hx of januvia but does not know why she was taken off of it  Patient to call in ~1wk to check in and report doses and fasting cbgs for new adjustment  A1C consistently ~11 shows very uncontrolled, microalbumin is wnl

## 2018-03-10 NOTE — Assessment & Plan Note (Signed)
Only a few times nausea per week, no vomiting.  Will refrain from zofran given hx of qt prolongation

## 2018-03-10 NOTE — Progress Notes (Signed)
Subjective:  [name] is a 47 y.o. female who presents to the [facility] today with a chief complaint of DM checkup.   HPI: DM: Had itching on lantus so was switched to tresibe by pharmacy at last visit and that has resolved.  We titrating up due to continued hyperglycemia but stop going up because her outpatient pharmacy wouldn't refill her tresiba and she was trying to "stretch" the pen.  41u daily was still producing fasting cbgs ~300 per patient with 5u novolog TID w/ meals.  Chronic bronchitis: taking meds as prescribed, needs refill.  No current respiratory complaints  Nausea w/o vomiting: hx of prolonged QT, this is sporadic so we will stop zofran.  This was discussed with patient.  No hematemesis/hemoptosis   Objective:  Physical Exam: BP 126/80   Pulse 61   Temp 98.3 F (36.8 C) (Oral)   Ht 5\' 7"  (1.702 m)   Wt 178 lb 6.4 oz (80.9 kg)   LMP 07/23/2014   SpO2 98%   BMI 27.94 kg/m   Gen: NAD, resting comfortably CV: RRR with no murmurs appreciated Pulm: NWOB, CTAB with no crackles, wheezes, or rhonchi GI: Normal bowel sounds present. Soft, Nontender, Nondistended. MSK: no edema, cyanosis, or clubbing noted Skin: warm, dry Neuro: grossly normal, moves all extremities Psych: Normal affect and thought content  Results for orders placed or performed in visit on 03/08/18 (from the past 72 hour(s))  POCT glycosylated hemoglobin (Hb A1C)     Status: Abnormal   Collection Time: 03/08/18  3:10 PM  Result Value Ref Range   Hemoglobin A1C     HbA1c POC (<> result, manual entry)     HbA1c, POC (prediabetic range)     HbA1c, POC (controlled diabetic range) 11.1 (A) 0.0 - 7.0 %  Microalbumin/Creatinine Ratio, Urine     Status: None   Collection Time: 03/08/18  4:07 PM  Result Value Ref Range   Creatinine, Urine 82.6 Not Estab. mg/dL   Microalbumin, Urine 15.6 Not Estab. ug/mL   Microalb/Creat Ratio 18.9 0.0 - 30.0 mg/g creat    Comment:                      Normal:                 0.0 -  30.0                      Albuminuria:          31.0 - 300.0                      Clinical albuminuria:       >300.0      Assessment/Plan:  Diabetes mellitus, type 2 (HCC) Had itching on lantus so was switched to tresibe by pharmacy at last visit and that has resolved.  We titrating up due to continued hyperglycemia but stop going up because her outpatient pharmacy wouldn't refill her tresiba and she was trying to "stretch" the pen.  41u daily was still producing fasting cbgs ~300 per patient with 5u novolog TID w/ meals.  New plan: increase tresiba to 51u daily, continue to titrate up by 1u daily if fasting CBG>100 Continue novolog 5u w/ meals TID Has not tolerated metformin in past Hx of januvia but does not know why she was taken off of it  Patient to call in ~1wk to check in and report doses and  fasting cbgs for new adjustment  A1C consistently ~11 shows very uncontrolled, microalbumin is wnl  Long QT interval Will hold zofran, if patient requests again we can check QT with ecg.  Declined today  Nausea without vomiting Only a few times nausea per week, no vomiting.  Will refrain from zofran given hx of qt prolongation   Sherene Sires, DO FAMILY MEDICINE RESIDENT - PGY2 03/10/2018 2:43 PM

## 2018-03-10 NOTE — Assessment & Plan Note (Signed)
Will hold zofran, if patient requests again we can check QT with ecg.  Declined today

## 2018-03-17 ENCOUNTER — Telehealth: Payer: Self-pay

## 2018-03-17 NOTE — Telephone Encounter (Signed)
Called and LM for pt reminding her she is due for labs.  Asked her to go before her appt with Dr. Loni Muse on 04-01-18

## 2018-03-25 ENCOUNTER — Other Ambulatory Visit (INDEPENDENT_AMBULATORY_CARE_PROVIDER_SITE_OTHER): Payer: Medicaid Other

## 2018-03-25 DIAGNOSIS — D869 Sarcoidosis, unspecified: Secondary | ICD-10-CM

## 2018-03-25 DIAGNOSIS — D8689 Sarcoidosis of other sites: Secondary | ICD-10-CM

## 2018-03-25 LAB — HEPATIC FUNCTION PANEL
ALK PHOS: 85 U/L (ref 39–117)
ALT: 13 U/L (ref 0–35)
AST: 16 U/L (ref 0–37)
Albumin: 4.1 g/dL (ref 3.5–5.2)
BILIRUBIN TOTAL: 0.6 mg/dL (ref 0.2–1.2)
Bilirubin, Direct: 0.1 mg/dL (ref 0.0–0.3)
Total Protein: 7.4 g/dL (ref 6.0–8.3)

## 2018-04-01 ENCOUNTER — Ambulatory Visit: Payer: Medicaid Other | Admitting: Gastroenterology

## 2018-04-01 ENCOUNTER — Other Ambulatory Visit (INDEPENDENT_AMBULATORY_CARE_PROVIDER_SITE_OTHER): Payer: Medicaid Other

## 2018-04-01 ENCOUNTER — Encounter: Payer: Self-pay | Admitting: Gastroenterology

## 2018-04-01 VITALS — BP 134/76 | HR 76 | Ht 66.5 in | Wt 184.1 lb

## 2018-04-01 DIAGNOSIS — R1032 Left lower quadrant pain: Secondary | ICD-10-CM | POA: Diagnosis not present

## 2018-04-01 DIAGNOSIS — D8689 Sarcoidosis of other sites: Secondary | ICD-10-CM | POA: Diagnosis not present

## 2018-04-01 DIAGNOSIS — K219 Gastro-esophageal reflux disease without esophagitis: Secondary | ICD-10-CM

## 2018-04-01 DIAGNOSIS — K5900 Constipation, unspecified: Secondary | ICD-10-CM | POA: Diagnosis not present

## 2018-04-01 LAB — CBC WITH DIFFERENTIAL/PLATELET
BASOS ABS: 0.1 10*3/uL (ref 0.0–0.1)
Basophils Relative: 1.9 % (ref 0.0–3.0)
Eosinophils Absolute: 0.1 10*3/uL (ref 0.0–0.7)
Eosinophils Relative: 1.1 % (ref 0.0–5.0)
HCT: 40.2 % (ref 36.0–46.0)
Hemoglobin: 14.1 g/dL (ref 12.0–15.0)
LYMPHS ABS: 2.7 10*3/uL (ref 0.7–4.0)
Lymphocytes Relative: 38.5 % (ref 12.0–46.0)
MCHC: 35.1 g/dL (ref 30.0–36.0)
MCV: 86.7 fl (ref 78.0–100.0)
MONOS PCT: 8.5 % (ref 3.0–12.0)
Monocytes Absolute: 0.6 10*3/uL (ref 0.1–1.0)
NEUTROS PCT: 50 % (ref 43.0–77.0)
Neutro Abs: 3.5 10*3/uL (ref 1.4–7.7)
PLATELETS: 229 10*3/uL (ref 150.0–400.0)
RBC: 4.63 Mil/uL (ref 3.87–5.11)
RDW: 12.8 % (ref 11.5–15.5)
WBC: 6.9 10*3/uL (ref 4.0–10.5)

## 2018-04-01 LAB — PROTIME-INR
INR: 1.1 ratio — ABNORMAL HIGH (ref 0.8–1.0)
Prothrombin Time: 12.5 s (ref 9.6–13.1)

## 2018-04-01 MED ORDER — LINACLOTIDE 290 MCG PO CAPS: 290.0000 ug | ORAL_CAPSULE | Freq: Every day | ORAL | 3 refills | Status: DC

## 2018-04-01 MED ORDER — PANTOPRAZOLE SODIUM 40 MG PO TBEC: 40.0000 mg | DELAYED_RELEASE_TABLET | Freq: Every day | ORAL | 3 refills | Status: DC

## 2018-04-01 NOTE — Progress Notes (Signed)
HPI :  47 year old female here for follow-up visit. She has a history of type 1 diabetes, long QT syndrome, status post ICD. SVT, status post ablation. stable pulmonary nodules, IBS, fibromyalgia, peripheral neuropathy, here for follow up for hepatic / pulmonary sarcoidosisand chronic constipation.  She previously had a [place] with elastography done otherwise showing a heterogenous liver without mass lesion, with elastography reported with F3-F4 score with high risk of fibrosis. She has had labs for chronic liver diseases which have been unremarkable other than elevated total IgG, with mild positive SMA, and negative ANA.She had a liver biopsy 08/02/15 - results c/w hepatic sarcoidosis with fibrotic changes but no evidence of cirrhosis. Portal pressures werenormal. She was treated with high dose steroids for her sarcoidosis and her LFTs have normalized. She reports her sarcoidosis appears pre-well-controlled. She has not been on steroids for a while and her liver function tests have remained completely normal.  She has been taking Linzess for her constipation dosed at 290 g once a day. This is working well for her bowels she does not have any problems. Her last colonoscopy was December of last year she had multiple adenomas removed, no other pathology.  Main issue today is her complaint of lower abdominal pain. When asked to localize this she points to her left inguinal region. This is positive positional and hurts when she sits up. When she stands up or lies down she has no pain. It's sharp pain. She denies any trauma to her hip or lower abdomen. No fevers. No nausea or vomiting, she is eating well. She has ran out of Protonix, she has not been noticing any reflux problems. She wonders if she should resume this. She's never had this type of discomfort before.  Priorworkup: 10/24/14 Upper endoscopy:Normal, no varices noted 10/25/14 oral contrast only CT abdomen pelvis: Probable mild  hepatosplenomegaly. Enlarged uterus containing large left leiomyoma.  12/03/14 Gastric emptying study normal Colonoscopy 2006 - normal Flex sig 2010 - hemorrhoids Flex sig 2011 - banded hemorrhoids Colonoscopy 04/19/2017 - 3 polyps removed, largest 84mm in size - adenomas, repeat in 3 years   Past Medical History:  Diagnosis Date  . Abdominal pain, periumbilic   . Allergic rhinitis   . Allergy   . Anemia   . Anxiety   . Arthritis   . Asthma   . Automatic implantable cardioverter-defibrillator in situ 2006, replaced 2008   [employer]  . Bronchitis   . Cancer ([place])    Nodules in lungs pt believes were cancerous pt unsure  . COPD (chronic obstructive pulmonary disease) ([place])   . DM (diabetes mellitus) ([place]) 1997/1998   type 1, initially gestational but soon after child birth developed IDDM  . Esophageal stricture 2005/2009   esophageal strictures dilated 2005, 2009  . Eye hemorrhage    bilateral  . Fibroid, uterine   . Fibromyalgia   . GERD (gastroesophageal reflux disease) 2005  . History of blood transfusion years ago  . Hypertension   . Internal hemorrhoids 2010   2011 band ligation of int rrhoids.  . Long Q-T syndrome   . Neuropathy in diabetes ([place])   . Palpitation   . Sarcoidosis   . Seizure disorder ([place])    none recently  . Stroke ([place])    QUESTIONABLE TIA      Past Surgical History:  Procedure Laterality Date  . AUTOMATIC IMPLANTABLE CARDIAC DEFIBRILLATOR SITU  2006,    ICD-Medtronic   Remote - Yes   . CERVIX REMOVAL    .  DILITATION & CURRETTAGE/HYSTROSCOPY WITH HYDROTHERMAL ABLATION N/A 06/07/2013   Procedure: DILATATION & CURETTAGE/HYSTEROSCOPY WITH attempted HYDROTHERMAL ABLATION;  Surgeon: Frederico Hamman, MD;  Location: [facility];  Service: Gynecology;  Laterality: N/A;  . EP IMPLANTABLE DEVICE N/A 04/04/2015   Procedure:  ICD Generator Changeout;  Surgeon: Evans Lance, MD;  Location: [facility];  Service: Cardiovascular;  Laterality: N/A;  .  ESOPHAGOGASTRODUODENOSCOPY (EGD) WITH PROPOFOL N/A 10/24/2014   Procedure: ESOPHAGOGASTRODUODENOSCOPY (EGD) WITH PROPOFOL;  Surgeon: Inda Castle, MD;  Location: [facility];  Service: Endoscopy;  Laterality: N/A;  . EXPLORATORY LAPAROTOMY  11/06/14   evacuation of post TAH/BSO hematoma  . HAND SURGERY Right    x 2  . HEMORRHOID BANDING  03/2010  . SALPINGECTOMY    . SVT ablation    . TOTAL ABDOMINAL HYSTERECTOMY W/ BILATERAL SALPINGOOPHORECTOMY  11/06/14   at [facility]. for menorrhagia, pelvic pain and enlarging uterine fibroids  . TUBAL LIGATION    . VIDEO BRONCHOSCOPY Bilateral 01/24/2015   Procedure: VIDEO BRONCHOSCOPY WITHOUT FLUORO;  Surgeon: Marshell Garfinkel, MD;  Location: [facility];  Service: Cardiopulmonary;  Laterality: Bilateral;   Family History  Problem Relation Age of Onset  . Heart attack Mother   . Lung cancer Maternal Uncle        x2  . Breast cancer Maternal Aunt        x2  . Stomach cancer Paternal Uncle   . Stomach cancer Paternal Grandfather   . Stomach cancer Paternal Grandmother   . Diabetes Father   . Positive PPD/TB Exposure Father   . Glaucoma Father   . Esophageal cancer Paternal Aunt   . Colon cancer Maternal Grandfather    Social History   Tobacco Use  . Smoking status: Never Smoker  . Smokeless tobacco: Never Used  Substance Use Topics  . Alcohol use: Yes    Alcohol/week: 0.0 standard drinks    Comment: None since 2015-was occasional  . Drug use: No   Current Outpatient Medications  Medication Sig Dispense Refill  . ACCU-CHEK SOFTCLIX LANCETS lancets Use as instructed 100 each 12  . acetaminophen (TYLENOL) 500 MG tablet Take 1,000 mg by mouth every 6 (six) hours as needed for mild pain or fever. Reported on 07/22/2015    . baclofen (LIORESAL) 10 MG tablet Take 1 tablet (10 mg total) by mouth 2 (two) times daily. 60 each 0  . beclomethasone (QVAR) 80 MCG/ACT inhaler Inhale 2 puffs into the lungs 2 (two) times daily. 1 Inhaler 5  . clonazePAM  (KLONOPIN) 0.5 MG tablet Take 1 tablet (0.5 mg total) by mouth every other day as needed for anxiety. 15 tablet 0  . cycloSPORINE (RESTASIS) 0.05 % ophthalmic emulsion Place 1 drop daily into both eyes.    . fluticasone (FLONASE) 50 MCG/ACT nasal spray Place 2 sprays into both nostrils daily as needed for allergies.     Marland Kitchen gabapentin (NEURONTIN) 300 MG capsule Take 1 capsule (300 mg total) by mouth 3 (three) times daily. 90 capsule 2  . glucose blood (ACCU-CHEK GUIDE) test strip Use as instructed 100 each 12  . Insulin Degludec (TRESIBA FLEXTOUCH) 200 UNIT/ML SOPN Inject 40-60 Units into the skin daily at 6 (six) AM. 1 pen 2  . Insulin Pen Needle 31G X 8 MM MISC BD UltraFine III Pen Needles. For use with insulin pen device. Inject insulin 6 x daily 200 each 3  . lactulose (CHRONULAC) 10 GM/15ML solution Take 30 ml TID 2700 mL 2  .  lamoTRIgine (LAMICTAL) 150 MG tablet Take 1 tablet (150 mg total) by mouth 2 (two) times daily. 60 tablet 5  . Lancet Devices (ACCU-CHEK SOFTCLIX) lancets Use as instructed 1 each 0  . levETIRAcetam (KEPPRA) 500 MG tablet Take 1 tablet (500 mg total) by mouth 2 (two) times daily. 180 tablet 3  . linaclotide (LINZESS) 290 MCG CAPS capsule Take 1 capsule (290 mcg total) by mouth daily before breakfast. 30 capsule 3  . meclizine (ANTIVERT) 32 MG tablet Take 1 tablet (32 mg total) by mouth 3 (three) times daily as needed. 30 tablet 0  . naproxen (NAPROSYN) 375 MG tablet Take 1 tablet (375 mg total) by mouth 2 (two) times daily with a meal. 60 tablet 3  . NOVOLOG FLEXPEN 100 UNIT/ML FlexPen Inject 5 units twice daily 15 mL 0  . Olopatadine HCl 0.2 % SOLN Place 1 drop as needed into both eyes.    . potassium chloride SA (K-DUR,KLOR-CON) 20 MEQ tablet Take 1 tablet (20 mEq total) by mouth daily. 30 tablet 2  . predniSONE (DELTASONE) 5 MG tablet Take by mouth daily with breakfast. 7.5mg  daily    . prenatal vitamin w/FE, FA (PRENATAL 1 + 1) 27-1 MG TABS tablet Take 1 tablet by  mouth daily at 12 noon. 30 each 12  . propranolol (INDERAL) 80 MG tablet Take 1 tablet (80 mg total) 2 (two) times daily by mouth. 30 tablet 11  . topiramate (TOPAMAX) 100 MG tablet Take 1 tablet (100 mg total) by mouth 2 (two) times daily. 180 tablet 3  . liraglutide (VICTOZA) 18 MG/3ML SOPN Inject 0.1 mLs (0.6 mg total) into the skin every morning for 10 doses. Then call your doctor to discuss your dose moving forward 1 mL 0  . pantoprazole (PROTONIX) 40 MG tablet Take 1 tablet (40 mg total) by mouth daily at 6 (six) AM. (Patient not taking: Reported on 04/01/2018) 30 tablet 2   No current facility-administered medications for this visit.    Allergies  Allergen Reactions  . Contrast Media [Iodinated Diagnostic Agents] Anaphylaxis and Shortness Of Breath    Needed to be defibrillated and patient was pre-medicated with radiology 13 hour premeds.   . Iohexol Anaphylaxis     Code: RASH, Desc: PT STATES SHE IS ALLERGIC TO IV CONTRAST 05/28/06/RM, Onset Date: [id number]   . Augmentin [Amoxicillin-Pot Clavulanate] Other (See Comments)    "stomach hurt"  . Codeine Nausea And Vomiting    jittery  . Demerol [Meperidine] Other (See Comments)    dysphoria  . Morphine And Related Nausea And Vomiting     Review of Systems: All systems reviewed and negative except where noted in HPI.   Lab Results  Component Value Date   ALT 13 03/25/2018   AST 16 03/25/2018   ALKPHOS 85 03/25/2018   BILITOT 0.6 03/25/2018    Lab Results  Component Value Date   WBC 6.9 04/01/2018   HGB 14.1 04/01/2018   HCT 40.2 04/01/2018   MCV 86.7 04/01/2018   PLT 229.0 04/01/2018    Lab Results  Component Value Date   INR 1.1 (H) 04/01/2018   INR 1.08 08/02/2015   INR 1.1 (H) 06/17/2015      Physical Exam: BP 134/76   Pulse 76   Ht 5' 6.5" (1.689 m)   Wt 184 lb 2 oz (83.5 kg)   LMP 07/23/2014   BMI 29.27 kg/m  Constitutional: Pleasant,well-developed, female in no acute distress. HEENT: Normocephalic  and atraumatic. Conjunctivae are normal.  No scleral icterus. Neck supple.  Cardiovascular: Normal rate, regular rhythm.  Pulmonary/chest: Effort normal and breath sounds normal. No wheezing, rales or rhonchi. Abdominal: standby [place] standby - Soft, nondistended, nontender in abdomen but tenderness to palpation in left inguinal canal and just superior to it. Positive carnett, no obvious hernia on exam today. .  There are no masses palpable. No hepatomegaly. Extremities: no edema Lymphadenopathy: No cervical adenopathy noted. Neurological: Alert and oriented to person place and time. Skin: Skin is warm and dry. No rashes noted. Psychiatric: Normal mood and affect. Behavior is normal.   ASSESSMENT AND PLAN: 47 year old female here for reassessment following issues:  Hepatic sarcoidosis - her liver and lungs have responded quite well to a prolonged course of steroids in recent years. Her LFTs are normal which is excellent news. She had no evidence of cirrhosis on prior biopsy although did have some fibrotic changes. Her CBC and INR normal. With her fibrotic changes she is at risk for cirrhosis in the future, I would monitor her LFTs every 6 months or so. We may consider interval imaging with ultrasound and see her as well. She should follow-up at least once yearly with me for this issue.  Left inguinal pain - acute pain starting 48 hours ago, this can be reproduced with palpation of the left inguinal area and superior to it. It's very positional and I suspect a musculoskeletal strain. I do not appreciate an obvious hernia on today's exam. She will try some NSAIDs and Flexeril at home, apply heating pad needed. If no improvement after the week and I asked her to contact me for reassessment.  Constipation - refilled Linzess, working well for her  GERD - she has stopped Protonix and not having much symptoms. She does not have to take this daily anymore, just recommend using it as  needed.  History of colon adenomas - due for repeat colonoscopy in 04/2020  [place] Cellar, MD [facility]

## 2018-04-01 NOTE — Patient Instructions (Addendum)
If you are age 46 or older, your body mass index should be between 23-30. Your Body mass index is 29.27 kg/m. If this is out of the aforementioned range listed, please consider follow up with your Primary Care Provider.  If you are age 8 or younger, your body mass index should be between 19-25. Your Body mass index is 29.27 kg/m. If this is out of the aformentioned range listed, please consider follow up with your Primary Care Provider.    We have sent the following medications to your pharmacy for you to pick up at your convenience: Linzess 29mcg  (We are also giving you samples today) Protonix 40mg   Please go to the lab in the basement of our building to have lab work done as you leave today. Hit "B" for basement when you get on the elevator.  When the doors open the lab is on your left.  We will call you with the results. Thank you.  Thank you for entrusting me with your care and for choosing [facility], Dr. Goodrich Cellar

## 2018-04-04 ENCOUNTER — Other Ambulatory Visit: Payer: Self-pay | Admitting: *Deleted

## 2018-04-04 DIAGNOSIS — E119 Type 2 diabetes mellitus without complications: Secondary | ICD-10-CM

## 2018-04-04 DIAGNOSIS — Z794 Long term (current) use of insulin: Principal | ICD-10-CM

## 2018-04-05 ENCOUNTER — Ambulatory Visit: Payer: Medicaid Other | Admitting: Obstetrics

## 2018-04-05 ENCOUNTER — Encounter: Payer: Self-pay | Admitting: Obstetrics

## 2018-04-05 ENCOUNTER — Other Ambulatory Visit (HOSPITAL_COMMUNITY)
Admission: RE | Admit: 2018-04-05 | Discharge: 2018-04-05 | Disposition: A | Discharge status: Home / Self Care | Payer: Medicaid Other | Source: Ambulatory Visit | Attending: Obstetrics | Admitting: Obstetrics

## 2018-04-05 ENCOUNTER — Ambulatory Visit (INDEPENDENT_AMBULATORY_CARE_PROVIDER_SITE_OTHER): Payer: Medicaid Other

## 2018-04-05 ENCOUNTER — Telehealth: Payer: Self-pay

## 2018-04-05 VITALS — BP 153/86 | HR 68 | Wt 183.0 lb

## 2018-04-05 DIAGNOSIS — B373 Candidiasis of vulva and vagina: Secondary | ICD-10-CM

## 2018-04-05 DIAGNOSIS — N898 Other specified noninflammatory disorders of vagina: Secondary | ICD-10-CM | POA: Diagnosis not present

## 2018-04-05 DIAGNOSIS — I4581 Long QT syndrome: Secondary | ICD-10-CM

## 2018-04-05 DIAGNOSIS — B3731 Acute candidiasis of vulva and vagina: Secondary | ICD-10-CM

## 2018-04-05 MED ORDER — GLUCOSE BLOOD VI STRP: ORAL_STRIP | 12 refills | Status: DC

## 2018-04-05 MED ORDER — INSULIN PEN NEEDLE 31G X 8 MM MISC: 3 refills | Status: DC

## 2018-04-05 MED ORDER — FLUCONAZOLE 150 MG PO TABS: 150.0000 mg | ORAL_TABLET | Freq: Once | ORAL | 4 refills | Status: DC

## 2018-04-05 MED ORDER — INSULIN DEGLUDEC 200 UNIT/ML ~~LOC~~ SOPN: 40.0000 [IU] | PEN_INJECTOR | Freq: Every day | SUBCUTANEOUS | 3 refills | Status: DC

## 2018-04-05 MED ORDER — ACCU-CHEK SOFTCLIX LANCETS MISC: 12 refills | Status: DC

## 2018-04-05 NOTE — Progress Notes (Signed)
Patient ID: [name], female   DOB: December 28, 1970, 47 y.o.   MRN: [id number]  Chief Complaint  Patient presents with  . Vaginal Discharge    HPI OMOLOLA Hicks is a 47 y.o. female.  Vaginal discharge with itching. HPI  Past Medical History:  Diagnosis Date  . Abdominal pain, periumbilic   . Allergic rhinitis   . Allergy   . Anemia   . Anxiety   . Arthritis   . Asthma   . Automatic implantable cardioverter-defibrillator in situ 2006, replaced 2008   MEDTRONIC  . Bronchitis   . Cancer ([place])    Nodules in lungs pt believes were cancerous pt unsure  . COPD (chronic obstructive pulmonary disease) ([place])   . DM (diabetes mellitus) ([place]) 1997/1998   type 1, initially gestational but soon after child birth developed IDDM  . Esophageal stricture 2005/2009   esophageal strictures dilated 2005, 2009  . Eye hemorrhage    bilateral  . Fibroid, uterine   . Fibromyalgia   . GERD (gastroesophageal reflux disease) 2005  . History of blood transfusion years ago  . Hypertension   . Internal hemorrhoids 2010   2011 band ligation of int rrhoids.  . Long Q-T syndrome   . Neuropathy in diabetes ([place])   . Palpitation   . Sarcoidosis   . Seizure disorder ([place])    none recently  . Stroke ([place])    QUESTIONABLE TIA     Past Surgical History:  Procedure Laterality Date  . AUTOMATIC IMPLANTABLE CARDIAC DEFIBRILLATOR SITU  2006,    ICD-Medtronic   Remote - Yes   . CERVIX REMOVAL    . DILITATION & CURRETTAGE/HYSTROSCOPY WITH HYDROTHERMAL ABLATION N/A 06/07/2013   Procedure: DILATATION & CURETTAGE/HYSTEROSCOPY WITH attempted HYDROTHERMAL ABLATION;  Surgeon: Frederico Hamman, MD;  Location: [facility];  Service: Gynecology;  Laterality: N/A;  . EP IMPLANTABLE DEVICE N/A 04/04/2015   Procedure:  ICD Generator Changeout;  Surgeon: Evans Lance, MD;  Location: [facility];  Service: Cardiovascular;  Laterality: N/A;  . ESOPHAGOGASTRODUODENOSCOPY (EGD) WITH PROPOFOL N/A 10/24/2014    Procedure: ESOPHAGOGASTRODUODENOSCOPY (EGD) WITH PROPOFOL;  Surgeon: Inda Castle, MD;  Location: [facility];  Service: Endoscopy;  Laterality: N/A;  . EXPLORATORY LAPAROTOMY  11/06/14   evacuation of post TAH/BSO hematoma  . HAND SURGERY Right    x 2  . HEMORRHOID BANDING  03/2010  . SALPINGECTOMY    . SVT ablation    . TOTAL ABDOMINAL HYSTERECTOMY W/ BILATERAL SALPINGOOPHORECTOMY  11/06/14   at [facility]. for menorrhagia, pelvic pain and enlarging uterine fibroids  . TUBAL LIGATION    . VIDEO BRONCHOSCOPY Bilateral 01/24/2015   Procedure: VIDEO BRONCHOSCOPY WITHOUT FLUORO;  Surgeon: Marshell Garfinkel, MD;  Location: [facility];  Service: Cardiopulmonary;  Laterality: Bilateral;    Family History  Problem Relation Age of Onset  . Heart attack Mother   . Lung cancer Maternal Uncle        x2  . Breast cancer Maternal Aunt        x2  . Stomach cancer Paternal Uncle   . Stomach cancer Paternal Grandfather   . Stomach cancer Paternal Grandmother   . Diabetes Father   . Positive PPD/TB Exposure Father   . Glaucoma Father   . Esophageal cancer Paternal Aunt   . Colon cancer Maternal Grandfather     Social History Social History   Tobacco Use  . Smoking status: Never Smoker  . Smokeless tobacco: Never Used  Substance  Use Topics  . Alcohol use: Yes    Alcohol/week: 0.0 standard drinks    Comment: None since 2015-was occasional  . Drug use: No    Allergies  Allergen Reactions  . Contrast Media [Iodinated Diagnostic Agents] Anaphylaxis and Shortness Of Breath    Needed to be defibrillated and patient was pre-medicated with radiology 13 hour premeds.   . Iohexol Anaphylaxis     Code: RASH, Desc: PT STATES SHE IS ALLERGIC TO IV CONTRAST 05/28/06/RM, Onset Date: [id number]   . Augmentin [Amoxicillin-Pot Clavulanate] Other (See Comments)    "stomach hurt"  . Codeine Nausea And Vomiting    jittery  . Demerol [Meperidine] Other (See Comments)    dysphoria  . Morphine And Related  Nausea And Vomiting    Current Outpatient Medications  Medication Sig Dispense Refill  . ACCU-CHEK SOFTCLIX LANCETS lancets Use as instructed 100 each 12  . acetaminophen (TYLENOL) 500 MG tablet Take 1,000 mg by mouth every 6 (six) hours as needed for mild pain or fever. Reported on 07/22/2015    . baclofen (LIORESAL) 10 MG tablet Take 1 tablet (10 mg total) by mouth 2 (two) times daily. 60 each 0  . beclomethasone (QVAR) 80 MCG/ACT inhaler Inhale 2 puffs into the lungs 2 (two) times daily. 1 Inhaler 5  . clonazePAM (KLONOPIN) 0.5 MG tablet Take 1 tablet (0.5 mg total) by mouth every other day as needed for anxiety. 15 tablet 0  . cycloSPORINE (RESTASIS) 0.05 % ophthalmic emulsion Place 1 drop daily into both eyes.    . fluticasone (FLONASE) 50 MCG/ACT nasal spray Place 2 sprays into both nostrils daily as needed for allergies.     Marland Kitchen gabapentin (NEURONTIN) 300 MG capsule Take 1 capsule (300 mg total) by mouth 3 (three) times daily. 90 capsule 2  . glucose blood (ACCU-CHEK GUIDE) test strip Use as instructed 100 each 12  . Insulin Degludec (TRESIBA FLEXTOUCH) 200 UNIT/ML SOPN Inject 40-60 Units into the skin daily at 6 (six) AM. 3 pen 3  . Insulin Pen Needle 31G X 8 MM MISC BD UltraFine III Pen Needles. For use with insulin pen device. Inject insulin 6 x daily 200 each 3  . lactulose (CHRONULAC) 10 GM/15ML solution Take 30 ml TID 2700 mL 2  . lamoTRIgine (LAMICTAL) 150 MG tablet Take 1 tablet (150 mg total) by mouth 2 (two) times daily. 60 tablet 5  . Lancet Devices (ACCU-CHEK SOFTCLIX) lancets Use as instructed 1 each 0  . levETIRAcetam (KEPPRA) 500 MG tablet Take 1 tablet (500 mg total) by mouth 2 (two) times daily. 180 tablet 3  . linaclotide (LINZESS) 290 MCG CAPS capsule Take 1 capsule (290 mcg total) by mouth daily before breakfast. 90 capsule 3  . meclizine (ANTIVERT) 32 MG tablet Take 1 tablet (32 mg total) by mouth 3 (three) times daily as needed. 30 tablet 0  . naproxen (NAPROSYN) 375 MG  tablet Take 1 tablet (375 mg total) by mouth 2 (two) times daily with a meal. 60 tablet 3  . NOVOLOG FLEXPEN 100 UNIT/ML FlexPen Inject 5 units twice daily 15 mL 0  . Olopatadine HCl 0.2 % SOLN Place 1 drop as needed into both eyes.    . pantoprazole (PROTONIX) 40 MG tablet Take 1 tablet (40 mg total) by mouth daily at 6 (six) AM. 90 tablet 3  . potassium chloride SA (K-DUR,KLOR-CON) 20 MEQ tablet Take 1 tablet (20 mEq total) by mouth daily. 30 tablet 2  . predniSONE (DELTASONE) 5 MG  tablet Take by mouth daily with breakfast. 7.5mg  daily    . prenatal vitamin w/FE, FA (PRENATAL 1 + 1) 27-1 MG TABS tablet Take 1 tablet by mouth daily at 12 noon. 30 each 12  . propranolol (INDERAL) 80 MG tablet Take 1 tablet (80 mg total) 2 (two) times daily by mouth. 30 tablet 11  . topiramate (TOPAMAX) 100 MG tablet Take 1 tablet (100 mg total) by mouth 2 (two) times daily. 180 tablet 3  . fluconazole (DIFLUCAN) 150 MG tablet Take 1 tablet (150 mg total) by mouth once for 1 dose. 1 tablet 4  . liraglutide (VICTOZA) 18 MG/3ML SOPN Inject 0.1 mLs (0.6 mg total) into the skin every morning for 10 doses. Then call your doctor to discuss your dose moving forward 1 mL 0   No current facility-administered medications for this visit.     Review of Systems Review of Systems Constitutional: negative for fatigue and weight loss Respiratory: negative for cough and wheezing Cardiovascular: negative for chest pain, fatigue and palpitations Gastrointestinal: negative for abdominal pain and change in bowel habits Genitourinary:POSITIVE for vaginal discharge with itching Integument/breast: negative for nipple discharge Musculoskeletal:negative for myalgias Neurological: negative for gait problems and tremors Behavioral/Psych: negative for abusive relationship, depression Endocrine: negative for temperature intolerance      Blood pressure (!) 153/86, pulse 68, weight 183 lb (83 kg), last menstrual period  07/23/2014.  Physical Exam Physical Exam           General:  Alert and no distress Abdomen:  normal findings: no organomegaly, soft, non-tender and no hernia  Pelvis:  External genitalia: normal general appearance Urinary system: urethral meatus normal and bladder without fullness, nontender Vaginal: normal without tenderness, induration or masses Cervix: normal appearance Adnexa: normal bimanual exam Uterus: anteverted and non-tender, normal size    50% of 15 min visit spent on counseling and coordination of care.   Data Reviewed Wet Prep  Assessment     1. Vaginal discharge Rx: - Cervicovaginal ancillary only  2. Candida vaginitis Rx: - fluconazole (DIFLUCAN) 150 MG tablet; Take 1 tablet (150 mg total) by mouth once for 1 dose.  Dispense: 1 tablet; Refill: 4    Plan  Follow up prn  No orders of the defined types were placed in this encounter.  Meds ordered this encounter  Medications  . fluconazole (DIFLUCAN) 150 MG tablet    Sig: Take 1 tablet (150 mg total) by mouth once for 1 dose.    Dispense:  1 tablet    Refill:  4     Shelly Bombard MD 04-05-2018

## 2018-04-05 NOTE — Progress Notes (Signed)
RGYN pt presents for problem visit today.  Pt has had Annual Exam with PCP.   Pt c/o possible yeast infection. Pt declines STD testing.

## 2018-04-05 NOTE — Telephone Encounter (Signed)
Received fax from [place] requesting prior authorization of Tresiba. Prior approval for [place] completed via [employer].  Med approved for 04/05/18 - 04/05/19.  Prior approval # [id number].  [facility] pharmacy informed.  Esau Grew, RN

## 2018-04-05 NOTE — Telephone Encounter (Signed)
LMOVM reminding pt to send remote transmission.   

## 2018-04-06 LAB — CERVICOVAGINAL ANCILLARY ONLY
BACTERIAL VAGINITIS: POSITIVE — AB
CANDIDA VAGINITIS: NEGATIVE

## 2018-04-06 NOTE — Progress Notes (Signed)
Remote ICD transmission.   

## 2018-04-07 ENCOUNTER — Ambulatory Visit: Payer: Medicaid Other | Admitting: Pharmacist

## 2018-04-07 ENCOUNTER — Other Ambulatory Visit: Payer: Self-pay | Admitting: Obstetrics

## 2018-04-07 DIAGNOSIS — B9689 Other specified bacterial agents as the cause of diseases classified elsewhere: Secondary | ICD-10-CM

## 2018-04-07 DIAGNOSIS — N760 Acute vaginitis: Principal | ICD-10-CM

## 2018-04-07 MED ORDER — METRONIDAZOLE 0.75 % VA GEL: 1.0000 | Freq: Two times a day (BID) | VAGINAL | 2 refills | Status: DC

## 2018-04-27 ENCOUNTER — Ambulatory Visit (INDEPENDENT_AMBULATORY_CARE_PROVIDER_SITE_OTHER): Payer: Medicaid Other | Admitting: Internal Medicine

## 2018-04-27 ENCOUNTER — Encounter: Payer: Self-pay | Admitting: Internal Medicine

## 2018-04-27 VITALS — BP 124/80 | HR 72 | Ht 66.0 in | Wt 182.0 lb

## 2018-04-27 DIAGNOSIS — Z95810 Presence of automatic (implantable) cardiac defibrillator: Secondary | ICD-10-CM | POA: Diagnosis not present

## 2018-04-27 DIAGNOSIS — I4581 Long QT syndrome: Secondary | ICD-10-CM

## 2018-04-27 NOTE — Patient Instructions (Signed)
Medication Instructions:  Your physician recommends that you continue on your current medications as directed. Please refer to the Current Medication list given to you today.  Labwork: None ordered.  Testing/Procedures: None ordered.  Follow-Up: Your physician wants you to follow-up in: one year with Dr. Lovena Le.   You will receive a reminder letter in the mail two months in advance. If you don't receive a letter, please call our office to schedule the follow-up appointment.  Remote monitoring is used to monitor your ICD from home. This monitoring reduces the number of office visits required to check your device to one time per year. It allows [place] to keep an eye on the functioning of your device to ensure it is working properly. You are scheduled for a device check from home on 07/05/2018. You may send your transmission at any time that day. If you have a wireless device, the transmission will be sent automatically. After your physician reviews your transmission, you will receive a postcard with your next transmission date.  Any Other Special Instructions Will Be Listed Below (If Applicable).  If you need a refill on your cardiac medications before your next appointment, please call your pharmacy.

## 2018-04-27 NOTE — Progress Notes (Signed)
HPI 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 She has HTN. She has sarcoidosis and is on steroids. She admits to dietary indiscretion. She has not had syncope. She has lost her common-law husband.  Allergies  Allergen Reactions  . Contrast Media [Iodinated Diagnostic Agents] Anaphylaxis and Shortness Of Breath    Needed to be defibrillated and patient was pre-medicated with radiology 13 hour premeds.   . Iohexol Anaphylaxis     Code: RASH, Desc: PT STATES SHE IS ALLERGIC TO IV CONTRAST 05/28/06/RM, Onset Date: [id number]   . Augmentin [Amoxicillin-Pot Clavulanate] Other (See Comments)    "stomach hurt"  . Codeine Nausea And Vomiting    jittery  . Demerol [Meperidine] Other (See Comments)    dysphoria  . Morphine And Related Nausea And Vomiting     Current Outpatient Medications  Medication Sig Dispense Refill  . ACCU-CHEK SOFTCLIX LANCETS lancets Use as instructed 100 each 12  . acetaminophen (TYLENOL) 500 MG tablet Take 1,000 mg by mouth every 6 (six) hours as needed for mild pain or fever. Reported on 07/22/2015    . baclofen (LIORESAL) 10 MG tablet Take 1 tablet (10 mg total) by mouth 2 (two) times daily. 60 each 0  . beclomethasone (QVAR) 80 MCG/ACT inhaler Inhale 2 puffs into the lungs 2 (two) times daily. 1 Inhaler 5  . clonazePAM (KLONOPIN) 0.5 MG tablet Take 1 tablet (0.5 mg total) by mouth every other day as needed for anxiety. 15 tablet 0  . cycloSPORINE (RESTASIS) 0.05 % ophthalmic emulsion Place 1 drop daily into both eyes.    . fluticasone (FLONASE) 50 MCG/ACT nasal spray Place 2 sprays into both nostrils daily as needed for allergies.     Marland Kitchen gabapentin (NEURONTIN) 300 MG capsule Take 1 capsule (300 mg total) by mouth 3 (three) times daily. 90 capsule 2  . glucose blood (ACCU-CHEK GUIDE) test strip Use as instructed 100 each 12  . Insulin Degludec (TRESIBA FLEXTOUCH) 200 UNIT/ML SOPN Inject 40-60 Units into the skin daily at 6 (six) AM. 3 pen  3  . Insulin Pen Needle 31G X 8 MM MISC BD UltraFine III Pen Needles. For use with insulin pen device. Inject insulin 6 x daily 200 each 3  . lamoTRIgine (LAMICTAL) 150 MG tablet Take 1 tablet (150 mg total) by mouth 2 (two) times daily. 60 tablet 5  . Lancet Devices (ACCU-CHEK SOFTCLIX) lancets Use as instructed 1 each 0  . levETIRAcetam (KEPPRA) 500 MG tablet Take 1 tablet (500 mg total) by mouth 2 (two) times daily. 180 tablet 3  . linaclotide (LINZESS) 290 MCG CAPS capsule Take 1 capsule (290 mcg total) by mouth daily before breakfast. 90 capsule 3  . meclizine (ANTIVERT) 32 MG tablet Take 1 tablet (32 mg total) by mouth 3 (three) times daily as needed. 30 tablet 0  . naproxen (NAPROSYN) 375 MG tablet Take 1 tablet (375 mg total) by mouth 2 (two) times daily with a meal. 60 tablet 3  . NOVOLOG FLEXPEN 100 UNIT/ML FlexPen Inject 5 units twice daily 15 mL 0  . Olopatadine HCl 0.2 % SOLN Place 1 drop as needed into both eyes.    . pantoprazole (PROTONIX) 40 MG tablet Take 1 tablet (40 mg total) by mouth daily at 6 (six) AM. 90 tablet 3  . potassium chloride SA (K-DUR,KLOR-CON) 20 MEQ tablet Take 1 tablet (20 mEq total) by mouth daily. 30 tablet 2  . prenatal vitamin w/FE, FA (  PRENATAL 1 + 1) 27-1 MG TABS tablet Take 1 tablet by mouth daily at 12 noon. 30 each 12  . propranolol (INDERAL) 80 MG tablet Take 1 tablet (80 mg total) 2 (two) times daily by mouth. 30 tablet 11  . liraglutide (VICTOZA) 18 MG/3ML SOPN Inject 0.1 mLs (0.6 mg total) into the skin every morning for 10 doses. Then call your doctor to discuss your dose moving forward 1 mL 0   No current facility-administered medications for this visit.      Past Medical History:  Diagnosis Date  . Abdominal pain, periumbilic   . Allergic rhinitis   . Allergy   . Anemia   . Anxiety   . Arthritis   . Asthma   . Automatic implantable cardioverter-defibrillator in situ 2006, replaced 2008   MEDTRONIC  . Bronchitis   . Cancer ([place])     Nodules in lungs pt believes were cancerous pt unsure  . COPD (chronic obstructive pulmonary disease) ([place])   . DM (diabetes mellitus) ([place]) 1997/1998   type 1, initially gestational but soon after child birth developed IDDM  . Esophageal stricture 2005/2009   esophageal strictures dilated 2005, 2009  . Eye hemorrhage    bilateral  . Fibroid, uterine   . Fibromyalgia   . GERD (gastroesophageal reflux disease) 2005  . History of blood transfusion years ago  . Hypertension   . Internal hemorrhoids 2010   2011 band ligation of int rrhoids.  . Long Q-T syndrome   . Neuropathy in diabetes ([place])   . Palpitation   . Sarcoidosis   . Seizure disorder ([place])    none recently  . Stroke ([place])    QUESTIONABLE TIA     ROS:   All systems reviewed and negative except as noted in the HPI.   Past Surgical History:  Procedure Laterality Date  . AUTOMATIC IMPLANTABLE CARDIAC DEFIBRILLATOR SITU  2006,    ICD-Medtronic   Remote - Yes   . CERVIX REMOVAL    . DILITATION & CURRETTAGE/HYSTROSCOPY WITH HYDROTHERMAL ABLATION N/A 06/07/2013   Procedure: DILATATION & CURETTAGE/HYSTEROSCOPY WITH attempted HYDROTHERMAL ABLATION;  Surgeon: Frederico Hamman, MD;  Location: [facility];  Service: Gynecology;  Laterality: N/A;  . EP IMPLANTABLE DEVICE N/A 04/04/2015   Procedure:  ICD Generator Changeout;  Surgeon: Evans Lance, MD;  Location: [facility];  Service: Cardiovascular;  Laterality: N/A;  . ESOPHAGOGASTRODUODENOSCOPY (EGD) WITH PROPOFOL N/A 10/24/2014   Procedure: ESOPHAGOGASTRODUODENOSCOPY (EGD) WITH PROPOFOL;  Surgeon: Inda Castle, MD;  [facility];  Service: Endoscopy;  Laterality: N/A;  . EXPLORATORY LAPAROTOMY  11/06/14   evacuation of post TAH/BSO hematoma  . HAND SURGERY Right    x 2  . HEMORRHOID BANDING  03/2010  . SALPINGECTOMY    . SVT ablation    . TOTAL ABDOMINAL HYSTERECTOMY W/ BILATERAL SALPINGOOPHORECTOMY  11/06/14   at [facility]. for menorrhagia, pelvic pain and  enlarging uterine fibroids  . TUBAL LIGATION    . VIDEO BRONCHOSCOPY Bilateral 01/24/2015   Procedure: VIDEO BRONCHOSCOPY WITHOUT FLUORO;  Surgeon: Marshell Garfinkel, MD;  Location: [facility];  Service: Cardiopulmonary;  Laterality: Bilateral;     Family History  Problem Relation Age of Onset  . Heart attack Mother   . Lung cancer Maternal Uncle        x2  . Breast cancer Maternal Aunt        x2  . Stomach cancer Paternal Uncle   . Stomach cancer Paternal Grandfather   .  Stomach cancer Paternal Grandmother   . Diabetes Father   . Positive PPD/TB Exposure Father   . Glaucoma Father   . Esophageal cancer Paternal Aunt   . Colon cancer Maternal Grandfather      Social History   Socioeconomic History  . Marital status: Single    Spouse name: Not on file  . Number of children: Not on file  . Years of education: Not on file  . Highest education level: Not on file  Occupational History  . Not on file  Social Needs  . Financial resource strain: Not on file  . Food insecurity:    Worry: Not on file    Inability: Not on file  . Transportation needs:    Medical: Not on file    Non-medical: Not on file  Tobacco Use  . Smoking status: Never Smoker  . Smokeless tobacco: Never Used  Substance and Sexual Activity  . Alcohol use: Yes    Alcohol/week: 0.0 standard drinks    Comment: None since 2015-was occasional  . Drug use: No  . Sexual activity: Yes    Birth control/protection: Condom  Lifestyle  . Physical activity:    Days per week: Not on file    Minutes per session: Not on file  . Stress: Not on file  Relationships  . Social connections:    Talks on phone: Not on file    Gets together: Not on file    Attends religious service: Not on file    Active member of club or organization: Not on file    Attends meetings of clubs or organizations: Not on file    Relationship status: Not on file  . Intimate partner violence:    Fear of current or ex partner: Not on file     Emotionally abused: Not on file    Physically abused: Not on file    Forced sexual activity: Not on file  Other Topics Concern  . Not on file  Social History Narrative  . Not on file     BP 124/80   Pulse 72   Ht 5\' 6"  (1.676 m)   Wt 182 lb (82.6 kg)   LMP 07/23/2014   BMI 29.38 kg/m   Physical Exam:  Well appearing NAD HEENT: Unremarkable Neck:  No JVD, no thyromegally Lymphatics:  No adenopathy Back:  No CVA tenderness Lungs:  Clear with no wheeze HEART:  Regular rate rhythm, no murmurs, no rubs, no clicks Abd:  soft, positive bowel sounds, no organomegally, no rebound, no guarding Ext:  2 plus pulses, no edema, no cyanosis, no clubbing Skin:  No rashes no nodules Neuro:  CN II through XII intact, motor grossly intact  EKG - NSR with long QT  DEVICE  Normal device function.  See PaceArt for details.   Assess/Plan: 1. 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 She has had a couple of NS episodes. 2. ICD - her medtronic ICD is working normally.  3. Sob - she admits to sodium indiscretion. I have asked her to avoid this.  Mikle Bosworth.D.

## 2018-05-06 LAB — CUP PACEART INCLINIC DEVICE CHECK
Date Time Interrogation Session: 20191220144540
Implantable Lead Implant Date: 20080828
Implantable Lead Location: 753860
Implantable Lead Model: 6947
MDC IDC PG IMPLANT DT: 20161117

## 2018-05-21 ENCOUNTER — Other Ambulatory Visit: Payer: Self-pay | Admitting: Obstetrics

## 2018-06-01 LAB — CUP PACEART REMOTE DEVICE CHECK
Battery Remaining Longevity: 115 mo
Battery Voltage: 3.02 V
Date Time Interrogation Session: 20191120014723
HighPow Impedance: 42 Ohm
HighPow Impedance: 64 Ohm
Implantable Lead Implant Date: 20080828
Implantable Lead Location: 753860
Implantable Lead Model: 6947
Implantable Pulse Generator Implant Date: 20161117
Lead Channel Pacing Threshold Amplitude: 1.625 V
Lead Channel Pacing Threshold Pulse Width: 0.4 ms
Lead Channel Sensing Intrinsic Amplitude: 4 mV
Lead Channel Sensing Intrinsic Amplitude: 4 mV
Lead Channel Setting Pacing Amplitude: 3.25 V
Lead Channel Setting Pacing Pulse Width: 0.4 ms
Lead Channel Setting Sensing Sensitivity: 0.3 mV
MDC IDC MSMT LEADCHNL RV IMPEDANCE VALUE: 323 Ohm
MDC IDC MSMT LEADCHNL RV IMPEDANCE VALUE: 380 Ohm
MDC IDC STAT BRADY RV PERCENT PACED: 0.01 %

## 2018-06-27 ENCOUNTER — Ambulatory Visit: Payer: Medicaid Other | Admitting: Neurology

## 2018-07-05 ENCOUNTER — Ambulatory Visit (INDEPENDENT_AMBULATORY_CARE_PROVIDER_SITE_OTHER): Payer: Medicaid Other

## 2018-07-05 DIAGNOSIS — I4581 Long QT syndrome: Secondary | ICD-10-CM | POA: Diagnosis not present

## 2018-07-06 LAB — CUP PACEART REMOTE DEVICE CHECK
Battery Remaining Longevity: 112 mo
Battery Voltage: 3.03 V
Brady Statistic RV Percent Paced: 0 %
Date Time Interrogation Session: 20200218072703
HighPow Impedance: 53 Ohm
HighPow Impedance: 85 Ohm
Implantable Lead Implant Date: 20080828
Implantable Lead Location: 753860
Implantable Pulse Generator Implant Date: 20161117
Lead Channel Impedance Value: 342 Ohm
Lead Channel Impedance Value: 437 Ohm
Lead Channel Pacing Threshold Amplitude: 1.25 V
Lead Channel Pacing Threshold Pulse Width: 0.4 ms
Lead Channel Sensing Intrinsic Amplitude: 3.875 mV
Lead Channel Sensing Intrinsic Amplitude: 3.875 mV
Lead Channel Setting Pacing Amplitude: 2.5 V
Lead Channel Setting Pacing Pulse Width: 0.4 ms
Lead Channel Setting Sensing Sensitivity: 0.3 mV

## 2018-07-13 NOTE — Progress Notes (Signed)
Remote ICD transmission.   

## 2018-07-18 ENCOUNTER — Encounter: Payer: Self-pay | Admitting: Cardiology

## 2018-07-29 ENCOUNTER — Other Ambulatory Visit: Payer: Self-pay

## 2018-07-29 ENCOUNTER — Ambulatory Visit: Payer: Medicaid Other | Admitting: Family Medicine

## 2018-07-29 ENCOUNTER — Encounter: Payer: Self-pay | Admitting: Family Medicine

## 2018-07-29 VITALS — BP 122/84 | HR 77 | Temp 98.5°F | Ht 66.0 in | Wt 186.0 lb

## 2018-07-29 DIAGNOSIS — M25552 Pain in left hip: Secondary | ICD-10-CM

## 2018-07-29 DIAGNOSIS — Z794 Long term (current) use of insulin: Secondary | ICD-10-CM | POA: Diagnosis not present

## 2018-07-29 DIAGNOSIS — E119 Type 2 diabetes mellitus without complications: Secondary | ICD-10-CM | POA: Diagnosis not present

## 2018-07-29 LAB — POCT GLYCOSYLATED HEMOGLOBIN (HGB A1C): HbA1c, POC (controlled diabetic range): 10.8 % — AB (ref 0.0–7.0)

## 2018-07-29 MED ORDER — INSULIN DEGLUDEC 200 UNIT/ML ~~LOC~~ SOPN: 60.0000 [IU] | PEN_INJECTOR | Freq: Every day | SUBCUTANEOUS | 3 refills | Status: DC

## 2018-07-29 MED ORDER — METFORMIN HCL ER 500 MG PO TB24: 500.0000 mg | ORAL_TABLET | Freq: Every day | ORAL | 0 refills | Status: DC

## 2018-07-29 MED ORDER — NOVOLOG FLEXPEN 100 UNIT/ML ~~LOC~~ SOPN: PEN_INJECTOR | SUBCUTANEOUS | 0 refills | Status: DC

## 2018-07-29 NOTE — Patient Instructions (Signed)
It was a pleasure to see you today! Thank you for choosing [facility] for your primary care. [name] was seen for diabetes management.  Today we talked about your diabetes management.  We increased [name] to 60 units/day.  We have also increased your NovoLog as recorded in the computer system because you have been taking 12 units with each meal.  We want you to continue taking this 12 units unless you find that you have a blood sugar under 150 at which point you should not to take the NovoLog and call and talk to [place] about changing some of her dosing.  I would like to have you come in and see our nutritionist as well as schedule a follow-up in roughly a week to talk with her pharmacy team about trying to dial in your medicine regimen.  In relation to your hip pain redness into the sports medicine clinic because of possibly have some bursitis, which they may be able to treat with an injection or with physical therapy.   Please bring all your medications to every doctors visit   Sign up for My Chart to have easy access to your labs results, and communication with your Primary care physician.     Please check-out at the front desk before leaving the clinic.     Best,  Dr. Sherene Sires FAMILY MEDICINE RESIDENT - PGY2 07/29/2018 2:09 PM

## 2018-07-30 LAB — BASIC METABOLIC PANEL
BUN/Creatinine Ratio: 6 — ABNORMAL LOW (ref 9–23)
BUN: 4 mg/dL — ABNORMAL LOW (ref 6–24)
CALCIUM: 9.5 mg/dL (ref 8.7–10.2)
CO2: 25 mmol/L (ref 20–29)
Chloride: 99 mmol/L (ref 96–106)
Creatinine, Ser: 0.62 mg/dL (ref 0.57–1.00)
GFR calc Af Amer: 125 mL/min/{1.73_m2} (ref >59)
GFR calc non Af Amer: 108 mL/min/{1.73_m2} (ref >59)
Glucose: 219 mg/dL — ABNORMAL HIGH (ref 65–99)
Potassium: 4 mmol/L (ref 3.5–5.2)
Sodium: 137 mmol/L (ref 134–144)

## 2018-07-31 DIAGNOSIS — M25552 Pain in left hip: Secondary | ICD-10-CM | POA: Insufficient documentation

## 2018-07-31 NOTE — Assessment & Plan Note (Signed)
Patient with pain or trochanter of left hip.  This is been problematic enough to the point where she has been favoring this leg.   Suspect potential trochanteric bursitis.  Instructed to try rest and NSAIDs.  We will also send to sports medicine for consideration for injection as patient says she would be interested in this.

## 2018-07-31 NOTE — Progress Notes (Signed)
Subjective:  [name] is a 48 y.o. female who presents to the [facility] today with a chief complaint of diabetes control.   HPI: Pain of left hip joint Patient with pain or trochanter of left hip.  This is been problematic enough to the point where she has been favoring this leg.   No known recent fevers or significant injuries.  No paresthesias or paralysis complaints.  Diabetes mellitus, type 2 ([place])  had been subjectively taking 50 to 60 units of Tresiba.  Most blood sugars well over 200, often in the 300s Despite charting of NovoLog at 5 units per meal was taking 12.  Still taking Victoza 0.6 daily.  Has not tolerated metformin in past, but would like to try one more time is in the XR formulation.  Lowest recorded blood sugar was 177, this was from memory she did not bring her glucometer with her.  She would like to meet with nutrition to try and help control her diabetes  Objective:  Physical Exam: BP 122/84   Pulse 77   Temp 98.5 F (36.9 C) (Oral)   Ht 5\' 6"  (1.676 m)   Wt 186 lb (84.4 kg)   LMP 07/23/2014   SpO2 97%   BMI 30.02 kg/m   Gen: NAD, resting comfortably CV: RRR with no murmurs appreciated Pulm: NWOB, CTAB with no crackles, wheezes, or rhonchi GI: Normal bowel sounds present. Soft, Nontender, Nondistended. MSK: No pain on internal and external rotation of femur, does have pain on palpation of her left trochanteric bursa.  No pain over the left knee. Skin: warm, dry Neuro: grossly normal, moves all extremities Psych: Normal affect and thought content  Results for orders placed or performed in visit on 07/29/18 (from the past 72 hour(s))  POCT glycosylated hemoglobin (Hb A1C)     Status: Abnormal   Collection Time: 07/29/18  1:49 PM  Result Value Ref Range   Hemoglobin A1C     HbA1c POC (<> result, manual entry)     HbA1c, POC (prediabetic range)     HbA1c, POC (controlled diabetic range) 10.8 (A) 0.0 - 7.0 %  Basic Metabolic Panel     Status: Abnormal    Collection Time: 07/29/18  2:14 PM  Result Value Ref Range   Glucose 219 (H) 65 - 99 mg/dL   BUN 4 (L) 6 - 24 mg/dL   Creatinine, Ser 0.62 0.57 - 1.00 mg/dL   GFR calc non Af Amer 108 >59 mL/min/1.73   GFR calc Af Amer 125 >59 mL/min/1.73   BUN/Creatinine Ratio 6 (L) 9 - 23   Sodium 137 134 - 144 mmol/L   Potassium 4.0 3.5 - 5.2 mmol/L   Chloride 99 96 - 106 mmol/L   CO2 25 20 - 29 mmol/L   Calcium 9.5 8.7 - 10.2 mg/dL     Assessment/Plan:  Pain of left hip joint Patient with pain or trochanter of left hip.  This is been problematic enough to the point where she has been favoring this leg.   Suspect potential trochanteric bursitis.  Instructed to try rest and NSAIDs.  We will also send to sports medicine for consideration for injection as patient says she would be interested in this.  Diabetes mellitus, type 2 ([place]) 07/27/18 had been subjectively taking 50 to 60 units of Tresiba.  Increase tresiba to 60u daily. Despite charting of NovoLog at 5 units per meal was taking 12, charted dose corrected to show 12 units with each meal  as long as her blood sugars are above 150 victoza 0.6 daily Has not tolerated metformin in past, but would like to try one more time is in the XR formulation Hx of januvia but does not know why she was taken off of it  Needs to schedule in a few weeks with pharmacy or PCP to discuss further titration, will also be sent to nutrition.   Sherene Sires, DO FAMILY MEDICINE RESIDENT - PGY2 07/31/2018 3:33 PM

## 2018-07-31 NOTE — Assessment & Plan Note (Addendum)
07/27/18 had been subjectively taking 50 to 60 units of Tresiba.  Increase tresiba to 60u daily. Despite charting of NovoLog at 5 units per meal was taking 12, charted dose corrected to show 12 units with each meal as long as her blood sugars are above 150 victoza 0.6 daily Has not tolerated metformin in past, but would like to try one more time is in the XR formulation Hx of januvia but does not know why she was taken off of it  Needs to schedule in a few weeks with pharmacy or PCP to discuss further titration, will also be sent to nutrition.

## 2018-08-05 ENCOUNTER — Ambulatory Visit: Payer: Medicaid Other | Admitting: *Deleted

## 2018-08-09 ENCOUNTER — Telehealth: Payer: Self-pay | Admitting: Pharmacist

## 2018-08-09 NOTE — Telephone Encounter (Signed)
Call to assess glycemic control and need for visit on 3/27   Plan to try again on 3/26 and determine need for visit on 3/27

## 2018-08-12 ENCOUNTER — Ambulatory Visit: Payer: Medicaid Other | Admitting: Pharmacist

## 2018-08-17 ENCOUNTER — Other Ambulatory Visit: Payer: Self-pay | Admitting: Internal Medicine

## 2018-09-28 ENCOUNTER — Telehealth: Payer: Self-pay

## 2018-09-28 DIAGNOSIS — D8689 Sarcoidosis of other sites: Secondary | ICD-10-CM

## 2018-09-28 NOTE — Telephone Encounter (Signed)
-----   Message from Roetta Sessions, [place] sent at 04/04/2018 10:58 AM EST ----- Regarding: LFTs due Patient due for LFTs in 09-2018.

## 2018-09-28 NOTE — Telephone Encounter (Signed)
Letter sent to pt to go to the lab for LFTs

## 2018-10-04 ENCOUNTER — Other Ambulatory Visit: Payer: Self-pay

## 2018-10-04 ENCOUNTER — Ambulatory Visit (INDEPENDENT_AMBULATORY_CARE_PROVIDER_SITE_OTHER): Payer: Medicaid Other | Admitting: *Deleted

## 2018-10-04 DIAGNOSIS — I4581 Long QT syndrome: Secondary | ICD-10-CM

## 2018-10-05 ENCOUNTER — Other Ambulatory Visit (INDEPENDENT_AMBULATORY_CARE_PROVIDER_SITE_OTHER): Payer: Medicaid Other

## 2018-10-05 DIAGNOSIS — D8689 Sarcoidosis of other sites: Secondary | ICD-10-CM

## 2018-10-05 LAB — CUP PACEART REMOTE DEVICE CHECK
Battery Remaining Longevity: 109 mo
Battery Voltage: 3.01 V
Brady Statistic RV Percent Paced: 0 %
Date Time Interrogation Session: 20200519211138
HighPow Impedance: 47 Ohm
HighPow Impedance: 77 Ohm
Implantable Lead Implant Date: 20080828
Implantable Lead Location: 753860
Implantable Lead Model: 6947
Implantable Pulse Generator Implant Date: 20161117
Lead Channel Impedance Value: 380 Ohm
Lead Channel Impedance Value: 399 Ohm
Lead Channel Pacing Threshold Amplitude: 1.375 V
Lead Channel Pacing Threshold Pulse Width: 0.4 ms
Lead Channel Sensing Intrinsic Amplitude: 3.625 mV
Lead Channel Sensing Intrinsic Amplitude: 3.625 mV
Lead Channel Setting Pacing Amplitude: 2.75 V
Lead Channel Setting Pacing Pulse Width: 0.4 ms
Lead Channel Setting Sensing Sensitivity: 0.3 mV

## 2018-10-05 LAB — HEPATIC FUNCTION PANEL
ALT: 20 U/L (ref 0–35)
AST: 18 U/L (ref 0–37)
Albumin: 3.8 g/dL (ref 3.5–5.2)
Alkaline Phosphatase: 96 U/L (ref 39–117)
Bilirubin, Direct: 0.1 mg/dL (ref 0.0–0.3)
Total Bilirubin: 0.6 mg/dL (ref 0.2–1.2)
Total Protein: 7.2 g/dL (ref 6.0–8.3)

## 2018-10-06 ENCOUNTER — Other Ambulatory Visit: Payer: Self-pay

## 2018-10-06 DIAGNOSIS — E119 Type 2 diabetes mellitus without complications: Secondary | ICD-10-CM

## 2018-10-06 DIAGNOSIS — Z794 Long term (current) use of insulin: Secondary | ICD-10-CM

## 2018-10-06 MED ORDER — INSULIN DEGLUDEC 200 UNIT/ML ~~LOC~~ SOPN: 60.0000 [IU] | PEN_INJECTOR | Freq: Every day | SUBCUTANEOUS | 3 refills | Status: DC

## 2018-10-07 ENCOUNTER — Telehealth: Payer: Self-pay | Admitting: *Deleted

## 2018-10-07 NOTE — Telephone Encounter (Signed)
Completed PA info in [employer] for [place].  Status pending.  Will recheck status in 24 hours. Christen Bame, CMA

## 2018-10-07 NOTE — Telephone Encounter (Signed)
Prior approval for [place] completed via [employer].  Med approved for 10/07/2018 - 10/02/2019 .Prior approval # [id number].  [facility] informed.  Christen Bame, CMA

## 2018-10-13 ENCOUNTER — Encounter: Payer: Self-pay | Admitting: Cardiology

## 2018-10-13 NOTE — Progress Notes (Signed)
Remote ICD transmission.   

## 2019-01-03 ENCOUNTER — Ambulatory Visit (INDEPENDENT_AMBULATORY_CARE_PROVIDER_SITE_OTHER): Payer: Medicaid Other | Admitting: *Deleted

## 2019-01-03 DIAGNOSIS — I4581 Long QT syndrome: Secondary | ICD-10-CM

## 2019-01-04 ENCOUNTER — Telehealth: Payer: Self-pay

## 2019-01-04 NOTE — Telephone Encounter (Signed)
Left message for patient to remind of missed remote transmission.  

## 2019-01-08 LAB — CUP PACEART REMOTE DEVICE CHECK
Battery Remaining Longevity: 105 mo
Battery Voltage: 3.01 V
Brady Statistic RV Percent Paced: 0.01 %
Date Time Interrogation Session: 20200821024225
HighPow Impedance: 51 Ohm
HighPow Impedance: 78 Ohm
Implantable Lead Implant Date: 20080828
Implantable Lead Location: 753860
Implantable Lead Model: 6947
Implantable Pulse Generator Implant Date: 20161117
Lead Channel Impedance Value: 342 Ohm
Lead Channel Impedance Value: 399 Ohm
Lead Channel Pacing Threshold Amplitude: 1.5 V
Lead Channel Pacing Threshold Pulse Width: 0.4 ms
Lead Channel Sensing Intrinsic Amplitude: 4.875 mV
Lead Channel Sensing Intrinsic Amplitude: 4.875 mV
Lead Channel Setting Pacing Amplitude: 3 V
Lead Channel Setting Pacing Pulse Width: 0.4 ms
Lead Channel Setting Sensing Sensitivity: 0.3 mV

## 2019-01-12 ENCOUNTER — Telehealth: Payer: Self-pay | Admitting: Family Medicine

## 2019-01-12 ENCOUNTER — Encounter: Payer: Self-pay | Admitting: Cardiology

## 2019-01-12 DIAGNOSIS — E119 Type 2 diabetes mellitus without complications: Secondary | ICD-10-CM

## 2019-01-12 DIAGNOSIS — Z794 Long term (current) use of insulin: Secondary | ICD-10-CM

## 2019-01-12 NOTE — Progress Notes (Signed)
Remote ICD transmission.   

## 2019-01-12 NOTE — Telephone Encounter (Signed)
Pt called and said that Dr. Einar Gip which is her eye doctor told her to have a renewed referral sent to his office. She is needing a diabetic eye exam. He said this has to be done every two years.

## 2019-01-12 NOTE — Telephone Encounter (Signed)
referal placed for DM eye exam at request of Dr. Einar Gip  -Dr. Criss Rosales

## 2019-01-17 ENCOUNTER — Ambulatory Visit: Payer: Medicaid Other | Admitting: Family Medicine

## 2019-01-17 ENCOUNTER — Other Ambulatory Visit: Payer: Self-pay

## 2019-01-17 VITALS — BP 122/70 | HR 72

## 2019-01-17 DIAGNOSIS — R2232 Localized swelling, mass and lump, left upper limb: Secondary | ICD-10-CM

## 2019-01-17 NOTE — Patient Instructions (Signed)
It was a pleasure to see you today! Thank you for choosing [facility] for your primary care. [name] was seen for painful nodule in arm. Come back to the clinic if you need anything.   Today we talked about the painful knot in your left arm and the pain in your left shoulder.  Given that you have that nodule we are going to get an ultrasound of this in order a few blood test.  We will results to you once they come in.   Please bring all your medications to every doctors visit   Sign up for My Chart to have easy access to your labs results, and communication with your Primary care physician.     Please check-out at the front desk before leaving the clinic.     Best,  Dr. Sherene Sires FAMILY MEDICINE RESIDENT - PGY3 01/17/2019 2:45 PM

## 2019-01-18 ENCOUNTER — Ambulatory Visit
Admission: RE | Admit: 2019-01-18 | Discharge: 2019-01-18 | Disposition: A | Discharge status: Home / Self Care | Payer: Medicaid Other | Source: Ambulatory Visit | Attending: Family Medicine | Admitting: Family Medicine

## 2019-01-18 DIAGNOSIS — R2232 Localized swelling, mass and lump, left upper limb: Secondary | ICD-10-CM | POA: Insufficient documentation

## 2019-01-18 LAB — CK: Total CK: 89 U/L (ref 32–182)

## 2019-01-18 LAB — MYOGLOBIN, SERUM: Myoglobin: 31 ng/mL (ref 25–58)

## 2019-01-18 NOTE — Progress Notes (Signed)
    Subjective:  [name] is a 48 y.o. female who presents to the [facility] today with a chief complaint of left arm nodule and shoulder pain.   HPI: Nodule of skin of left upper extremity Patient with complaint of months of painful nodule on left arm immediately distal to the lateral deltoid.  Also complains of tightness and subjective swelling in left trapezius and shoulder.  No known specific injury or trigger, no skin changes.  She has tried baclofen with no improvement  Objective:  Physical Exam: BP 122/70   Pulse 72   LMP 07/23/2014   SpO2 95%   Gen: NAD, conversing comfortably CV: RRR with no murmurs appreciated Pulm: NWOB, CTAB with no crackles, wheezes, or rhonchi GI: Normal bowel sounds present. Soft, Nontender, Nondistended. MSK: Palpable painful nodule approximately 2 inches in abnormal shape, lateral on the left arm and distal to the deltoid proximally middle of the humerus.  This is painful to touch but there are no skin changes, no warmth or erythema.  Nodule does appear to be mobile.  There is also increased density in the left trapezius although no visible asymmetry or swelling despite patient subjective complaints. Skin: warm, dry Neuro: grossly normal, moves all extremities Psych: Normal affect and thought content  No results found for this or any previous visit (from the past 72 hour(s)).   Assessment/Plan:  Nodule of skin of left upper extremity Patient with complaint of months of painful nodule on left arm immediately distal to the lateral deltoid.  Also complains of tightness and subjective swelling in left trapezius and shoulder.  No known specific injury or trigger, no skin changes.  Given history of sarcoid will pull CK and myoglobin for concern of myositis, will also order ultrasound of nodule on the left arm distal to the deltoid.  Return precautions discussed   Sherene Sires, [place] - PGY3 01/18/2019 8:02 AM

## 2019-01-18 NOTE — Assessment & Plan Note (Signed)
Patient with complaint of months of painful nodule on left arm immediately distal to the lateral deltoid.  Also complains of tightness and subjective swelling in left trapezius and shoulder.  No known specific injury or trigger, no skin changes.  Given history of sarcoid will pull CK and myoglobin for concern of myositis, will also order ultrasound of nodule on the left arm distal to the deltoid.  Return precautions discussed

## 2019-01-19 ENCOUNTER — Telehealth: Payer: Self-pay | Admitting: Family Medicine

## 2019-01-19 DIAGNOSIS — E119 Type 2 diabetes mellitus without complications: Secondary | ICD-10-CM | POA: Diagnosis not present

## 2019-01-19 LAB — HM DIABETES EYE EXAM

## 2019-01-19 NOTE — Telephone Encounter (Signed)
Please call patient, " Dr. Criss Rosales wanted [place] to call you and let you know that your testing from your recent clinic visit did not show anything abnormal at all that would explain the pain in your arm and shoulder.  When he was looking through your chart further he noticed that you have not seen pulmonology (Dr. Vaughan Browner the lung doctor) for a few years and their last note said that they wanted to see you again in 3 or 4 months.  He like to suggest that you call them and get a follow-up appointment and explained the symptoms because there is a chance that it could be related to sarcoid.  If you need to talk further we can help you schedule an appointment to talk with Dr. Criss Rosales as soon as you are both available."  -Dr. Criss Rosales

## 2019-01-19 NOTE — Telephone Encounter (Signed)
Tried to contact pt to inform her of below and there was no answer and VM was full.  If pt calls back please inform her and assist in scheduling an appointment if she would like to talk to Dr. Criss Rosales further.April Zimmerman Rumple, CMA

## 2019-02-09 ENCOUNTER — Other Ambulatory Visit: Payer: Self-pay

## 2019-02-09 ENCOUNTER — Telehealth: Payer: Self-pay

## 2019-02-09 DIAGNOSIS — Z20822 Contact with and (suspected) exposure to covid-19: Secondary | ICD-10-CM

## 2019-02-09 DIAGNOSIS — R6889 Other general symptoms and signs: Secondary | ICD-10-CM | POA: Diagnosis not present

## 2019-02-09 NOTE — Telephone Encounter (Addendum)
Pt had OV on 9/1, calling to get blood test results. Pt has been taking the ibuprofen but it is not helping. Pt would like something stronger for pain. Pt can't come in for an appointment as she is being quarantined due to exposure to COVID. She was tested yesterday. Pt can be reached [phone]  Ottis Stain, CMA

## 2019-02-10 ENCOUNTER — Telehealth: Payer: Self-pay | Admitting: Family Medicine

## 2019-02-10 LAB — NOVEL CORONAVIRUS, NAA: SARS-CoV-2, NAA: DETECTED — AB

## 2019-02-10 NOTE — Telephone Encounter (Signed)
Patient called and left hippa compliant voicemail to tell them that the coronavirus test to come back positive they were advised to self isolate for at least 14 days past the date of the test plus at least 3 days past the resolution of symptoms.  They are also advised to consider getting off a member living the house tested in a few days.  They were advised that if they feel they are sick enough to require the emergency department they should go into the ED, but that otherwise they should stay home and stay as isolated as possible  They were also told that they want to have a longer conversation about this result that they can call and schedule a telephone visit with our clinic.  Dr. Criss Rosales

## 2019-02-10 NOTE — Telephone Encounter (Signed)
Patient returns PCP phone call to nurse line. I informed her of positive result and to self isolate for 14 days. Patient stated she is not experiencing and symptoms of the virus at this time. ED precautions given and all questions answered.

## 2019-02-13 ENCOUNTER — Telehealth: Payer: Self-pay | Admitting: *Deleted

## 2019-02-13 NOTE — Telephone Encounter (Signed)
.   Pt given result; the pt says that she was informed by her MD's office that she was positive; she is not having any new symptoms; she is under MD's care for ongoing symptoms that are consistent with COVID, and she has COPD; instructions given to pt: remain in self-quarantine until they meet the "Non-Test Criteria for Ending Self-Isolation". Non-Test Criteria for Ending Self-Isolation All persons with fever and respiratory symptoms should isolate themselves until ALL conditions listed below are met: - at least 10 days since symptoms onset - AND 3 consecutive days fever free without antipyretics (acetaminophen [Tylenol] or ibuprofen [Advil]) - AND improvement in respiratory symptoms . If the patient develops respiratory issues/distress, seek medical care in the Emergency Department, call 911, reports symptoms and report COVID-19 positive test. Patient Instructions . Instruct the patient to continue to utilize over-the-counter medications for fever (ibuprofen and/or Tylenol) and cough (cough medicine and/or sore throat lozenges). Wannetta Sender the patient to wear a mask around people and follow good infection prevention techniques. . Patient to should only leave home to seek medical care. . Instruct patient to send family for food, prescriptions or medicines; or use delivery service.  . If the patient must leave the home, they must wear a mask in public. . Instruct patient to limit contact with immediate family members or caregivers in the home, and use mask, social distancing, and handwashing to decrease risk to patients. o Please continue good preventive care measures, including frequent hand washing, avoid touching your face, cover coughs/sneezes with tissue or into elbow, stay out of crowds and keep a 6-foot distance from others.   Instruct patient and family to clean hard surfaces touched by patient frequently with household cleaning products; pt also informed the [facility] Dept will be  notified;she verbalized understanding.

## 2019-03-15 ENCOUNTER — Encounter: Payer: Self-pay | Admitting: Family Medicine

## 2019-04-04 ENCOUNTER — Encounter: Payer: Medicaid Other | Admitting: *Deleted

## 2019-04-04 ENCOUNTER — Telehealth: Payer: Self-pay

## 2019-04-04 ENCOUNTER — Other Ambulatory Visit: Payer: Self-pay

## 2019-04-04 DIAGNOSIS — D8689 Sarcoidosis of other sites: Secondary | ICD-10-CM

## 2019-04-04 DIAGNOSIS — D869 Sarcoidosis, unspecified: Secondary | ICD-10-CM

## 2019-04-04 NOTE — Telephone Encounter (Signed)
-----   Message from Hughie Closs, RN sent at 10/06/2018  9:41 AM EDT ----- Patient needs  6 month LFT put in Epic and pt. Called to come in. Due 04/08/19

## 2019-04-04 NOTE — Telephone Encounter (Signed)
Left message for patient to please come into our lab for 6 month repeat labs. Mon-Fri. Between 7:30am-4:30pm. And to please call back at [phone] if any questions about this

## 2019-04-05 ENCOUNTER — Other Ambulatory Visit (INDEPENDENT_AMBULATORY_CARE_PROVIDER_SITE_OTHER): Payer: Medicaid Other

## 2019-04-05 DIAGNOSIS — D869 Sarcoidosis, unspecified: Secondary | ICD-10-CM

## 2019-04-05 DIAGNOSIS — D8689 Sarcoidosis of other sites: Secondary | ICD-10-CM

## 2019-04-05 LAB — HEPATIC FUNCTION PANEL
ALT: 15 U/L (ref 0–35)
AST: 14 U/L (ref 0–37)
Albumin: 4.2 g/dL (ref 3.5–5.2)
Alkaline Phosphatase: 98 U/L (ref 39–117)
Bilirubin, Direct: 0.1 mg/dL (ref 0.0–0.3)
Total Bilirubin: 0.8 mg/dL (ref 0.2–1.2)
Total Protein: 8 g/dL (ref 6.0–8.3)

## 2019-04-06 ENCOUNTER — Telehealth: Payer: Self-pay

## 2019-04-06 NOTE — Telephone Encounter (Signed)
Left message for patient to remind of missed remote transmission.  

## 2019-04-06 NOTE — Telephone Encounter (Signed)
-----   Message from Yetta Flock, MD sent at 04/05/2019  5:07 PM EST ----- Sherlynn Stalls can you let the patient know her LFTs are stable / normal, which is good news. She is due for an office follow up if you can help coordinate if possible. Thanks

## 2019-04-06 NOTE — Telephone Encounter (Signed)
Attempted to call patient, mail box if full. Will try later

## 2019-04-10 ENCOUNTER — Other Ambulatory Visit: Payer: Self-pay

## 2019-04-10 ENCOUNTER — Telehealth: Payer: Self-pay | Admitting: Gastroenterology

## 2019-04-10 ENCOUNTER — Telehealth: Payer: Self-pay | Admitting: Pulmonary Disease

## 2019-04-10 ENCOUNTER — Telehealth: Payer: Self-pay

## 2019-04-10 DIAGNOSIS — K5900 Constipation, unspecified: Secondary | ICD-10-CM

## 2019-04-10 MED ORDER — LINACLOTIDE 290 MCG PO CAPS: 290.0000 ug | ORAL_CAPSULE | Freq: Every day | ORAL | 3 refills | Status: DC

## 2019-04-10 NOTE — Telephone Encounter (Signed)
Attempted calling patient again, was able to leave a message this time. Asked patient to please call back

## 2019-04-10 NOTE — Telephone Encounter (Signed)
LMTCB  Last seen 2018. Will need OV for refills. Labs will need to be addressed by the ordering provider.

## 2019-04-11 NOTE — Telephone Encounter (Signed)
LMTCB

## 2019-04-12 NOTE — Telephone Encounter (Signed)
LMTCB

## 2019-04-17 ENCOUNTER — Other Ambulatory Visit: Payer: Self-pay | Admitting: Obstetrics

## 2019-04-17 DIAGNOSIS — B9689 Other specified bacterial agents as the cause of diseases classified elsewhere: Secondary | ICD-10-CM

## 2019-04-17 DIAGNOSIS — B373 Candidiasis of vulva and vagina: Secondary | ICD-10-CM

## 2019-04-17 DIAGNOSIS — B3731 Acute candidiasis of vulva and vagina: Secondary | ICD-10-CM

## 2019-04-17 NOTE — Telephone Encounter (Signed)
LMTCB

## 2019-04-18 NOTE — Telephone Encounter (Signed)
Closing per triage protocol as pt has been left several msgs

## 2019-05-15 ENCOUNTER — Other Ambulatory Visit: Payer: Self-pay

## 2019-05-15 DIAGNOSIS — E119 Type 2 diabetes mellitus without complications: Secondary | ICD-10-CM

## 2019-05-15 MED ORDER — TRESIBA FLEXTOUCH 200 UNIT/ML ~~LOC~~ SOPN: 60.0000 [IU] | PEN_INJECTOR | Freq: Every day | SUBCUTANEOUS | 3 refills | Status: DC

## 2019-05-17 ENCOUNTER — Other Ambulatory Visit: Payer: Self-pay

## 2019-05-17 DIAGNOSIS — Z794 Long term (current) use of insulin: Secondary | ICD-10-CM

## 2019-05-17 DIAGNOSIS — E119 Type 2 diabetes mellitus without complications: Secondary | ICD-10-CM

## 2019-05-17 MED ORDER — TRESIBA FLEXTOUCH 200 UNIT/ML ~~LOC~~ SOPN: 60.0000 [IU] | PEN_INJECTOR | Freq: Every day | SUBCUTANEOUS | 3 refills | Status: DC

## 2019-05-18 ENCOUNTER — Ambulatory Visit: Payer: Medicaid Other | Admitting: Gastroenterology

## 2019-06-21 ENCOUNTER — Other Ambulatory Visit (HOSPITAL_COMMUNITY)
Admission: RE | Admit: 2019-06-21 | Discharge: 2019-06-21 | Disposition: A | Discharge status: Home / Self Care | Payer: Medicaid Other | Source: Ambulatory Visit | Attending: Obstetrics | Admitting: Obstetrics

## 2019-06-21 ENCOUNTER — Other Ambulatory Visit: Payer: Self-pay

## 2019-06-21 ENCOUNTER — Encounter: Payer: Self-pay | Admitting: Obstetrics

## 2019-06-21 ENCOUNTER — Ambulatory Visit: Payer: Medicaid Other | Admitting: Obstetrics

## 2019-06-21 VITALS — BP 141/83 | HR 75 | Wt 183.0 lb

## 2019-06-21 DIAGNOSIS — Z9079 Acquired absence of other genital organ(s): Secondary | ICD-10-CM | POA: Diagnosis not present

## 2019-06-21 DIAGNOSIS — B3731 Acute candidiasis of vulva and vagina: Secondary | ICD-10-CM

## 2019-06-21 DIAGNOSIS — B373 Candidiasis of vulva and vagina: Secondary | ICD-10-CM

## 2019-06-21 DIAGNOSIS — Z90710 Acquired absence of both cervix and uterus: Secondary | ICD-10-CM | POA: Diagnosis not present

## 2019-06-21 DIAGNOSIS — N898 Other specified noninflammatory disorders of vagina: Secondary | ICD-10-CM | POA: Insufficient documentation

## 2019-06-21 DIAGNOSIS — Z90722 Acquired absence of ovaries, bilateral: Secondary | ICD-10-CM

## 2019-06-21 MED ORDER — FLUCONAZOLE 200 MG PO TABS: 200.0000 mg | ORAL_TABLET | ORAL | 2 refills | Status: DC

## 2019-06-21 NOTE — Progress Notes (Signed)
Patient ID: [name], female   DOB: 19-Sep-1970, 49 y.o.   MRN: [id number]  Chief Complaint  Patient presents with  . GYN    HPI [name] is a 49 y.o. female.  Vaginal discharge and itching after using MetroGel for BV. HPI  Past Medical History:  Diagnosis Date  . Abdominal pain, periumbilic   . Allergic rhinitis   . Allergy   . Anemia   . Anxiety   . Arthritis   . Asthma   . Automatic implantable cardioverter-defibrillator in situ 2006, replaced 2008   MEDTRONIC  . Bronchitis   . Cancer ([place])    Nodules in lungs pt believes were cancerous pt unsure  . COPD (chronic obstructive pulmonary disease) ([place])   . DM (diabetes mellitus) ([place]) 1997/1998   type 1, initially gestational but soon after child birth developed IDDM  . Esophageal stricture 2005/2009   esophageal strictures dilated 2005, 2009  . Eye hemorrhage    bilateral  . Fibroid, uterine   . Fibromyalgia   . GERD (gastroesophageal reflux disease) 2005  . History of blood transfusion years ago  . Hypertension   . Internal hemorrhoids 2010   2011 band ligation of int rrhoids.  . Long Q-T syndrome   . Neuropathy in diabetes ([place])   . Palpitation   . Sarcoidosis   . Seizure disorder ([place])    none recently  . Stroke ([place])    QUESTIONABLE TIA     Past Surgical History:  Procedure Laterality Date  . AUTOMATIC IMPLANTABLE CARDIAC DEFIBRILLATOR SITU  2006,    ICD-Medtronic   Remote - Yes   . CERVIX REMOVAL    . DILITATION & CURRETTAGE/HYSTROSCOPY WITH HYDROTHERMAL ABLATION N/A 06/07/2013   Procedure: DILATATION & CURETTAGE/HYSTEROSCOPY WITH attempted HYDROTHERMAL ABLATION;  Surgeon: Frederico Hamman, MD;  Location: [facility];  Service: Gynecology;  Laterality: N/A;  . EP IMPLANTABLE DEVICE N/A 04/04/2015   Procedure:  ICD Generator Changeout;  Surgeon: Evans Lance, MD;  Location: [facility];  Service: Cardiovascular;  Laterality: N/A;  . ESOPHAGOGASTRODUODENOSCOPY (EGD) WITH PROPOFOL N/A  10/24/2014   Procedure: ESOPHAGOGASTRODUODENOSCOPY (EGD) WITH PROPOFOL;  Surgeon: Inda Castle, MD;  Location: [facility];  Service: Endoscopy;  Laterality: N/A;  . EXPLORATORY LAPAROTOMY  11/06/14   evacuation of post TAH/BSO hematoma  . HAND SURGERY Right    x 2  . HEMORRHOID BANDING  03/2010  . SALPINGECTOMY    . SVT ablation    . TOTAL ABDOMINAL HYSTERECTOMY W/ BILATERAL SALPINGOOPHORECTOMY  11/06/14   at [facility]. for menorrhagia, pelvic pain and enlarging uterine fibroids  . TUBAL LIGATION    . VIDEO BRONCHOSCOPY Bilateral 01/24/2015   Procedure: VIDEO BRONCHOSCOPY WITHOUT FLUORO;  Surgeon: Marshell Garfinkel, MD;  Location: [facility];  Service: Cardiopulmonary;  Laterality: Bilateral;    Family History  Problem Relation Age of Onset  . Heart attack Mother   . Lung cancer Maternal Uncle        x2  . Breast cancer Maternal Aunt        x2  . Stomach cancer Paternal Uncle   . Stomach cancer Paternal Grandfather   . Stomach cancer Paternal Grandmother   . Diabetes Father   . Positive PPD/TB Exposure Father   . Glaucoma Father   . Esophageal cancer Paternal Aunt   . Colon cancer Maternal Grandfather     Social History Social History   Tobacco Use  . Smoking status: Never Smoker  . Smokeless tobacco:  Never Used  Substance Use Topics  . Alcohol use: Yes    Alcohol/week: 0.0 standard drinks    Comment: None since 2015-was occasional  . Drug use: No    Allergies  Allergen Reactions  . Contrast Media [Iodinated Diagnostic Agents] Anaphylaxis and Shortness Of Breath    Needed to be defibrillated and patient was pre-medicated with radiology 13 hour premeds.   . Iohexol Anaphylaxis     Code: RASH, Desc: PT STATES SHE IS ALLERGIC TO IV CONTRAST 05/28/06/RM, Onset Date: [id number]   . Augmentin [Amoxicillin-Pot Clavulanate] Other (See Comments)    "stomach hurt"  . Codeine Nausea And Vomiting    jittery  . Demerol [Meperidine] Other (See Comments)    dysphoria  . Morphine And  Related Nausea And Vomiting    Current Outpatient Medications  Medication Sig Dispense Refill  . ACCU-CHEK SOFTCLIX LANCETS lancets Use as instructed 100 each 12  . acetaminophen (TYLENOL) 500 MG tablet Take 1,000 mg by mouth every 6 (six) hours as needed for mild pain or fever. Reported on 07/22/2015    . baclofen (LIORESAL) 10 MG tablet Take 1 tablet (10 mg total) by mouth 2 (two) times daily. 60 each 0  . beclomethasone (QVAR) 80 MCG/ACT inhaler Inhale 2 puffs into the lungs 2 (two) times daily. 1 Inhaler 5  . clonazePAM (KLONOPIN) 0.5 MG tablet Take 1 tablet (0.5 mg total) by mouth every other day as needed for anxiety. 15 tablet 0  . cycloSPORINE (RESTASIS) 0.05 % ophthalmic emulsion Place 1 drop daily into both eyes.    . fluticasone (FLONASE) 50 MCG/ACT nasal spray Place 2 sprays into both nostrils daily as needed for allergies.     Marland Kitchen gabapentin (NEURONTIN) 300 MG capsule Take 1 capsule (300 mg total) by mouth 3 (three) times daily. 90 capsule 2  . glucose blood (ACCU-CHEK GUIDE) test strip Use as instructed 100 each 12  . Insulin Degludec (TRESIBA FLEXTOUCH) 200 UNIT/ML SOPN Inject 60 Units into the skin daily at 6 (six) AM. 3 pen 3  . Insulin Pen Needle 31G X 8 MM MISC BD UltraFine III Pen Needles. For use with insulin pen device. Inject insulin 6 x daily 200 each 3  . lamoTRIgine (LAMICTAL) 150 MG tablet Take 1 tablet (150 mg total) by mouth 2 (two) times daily. 60 tablet 5  . Lancet Devices (ACCU-CHEK SOFTCLIX) lancets Use as instructed 1 each 0  . levETIRAcetam (KEPPRA) 500 MG tablet Take 1 tablet (500 mg total) by mouth 2 (two) times daily. 180 tablet 3  . linaclotide (LINZESS) 290 MCG CAPS capsule Take 1 capsule (290 mcg total) by mouth daily before breakfast. 90 capsule 3  . meclizine (ANTIVERT) 32 MG tablet Take 1 tablet (32 mg total) by mouth 3 (three) times daily as needed. 30 tablet 0  . metroNIDAZOLE (METROGEL) 0.75 % vaginal gel PLACE 1 APPLICATORFUL VAGINALLY TWICE DAILY 70 g  2  . naproxen (NAPROSYN) 375 MG tablet Take 1 tablet (375 mg total) by mouth 2 (two) times daily with a meal. 60 tablet 3  . NOVOLOG FLEXPEN 100 UNIT/ML FlexPen Inject 12u up to 3 times per day with meals as long as sugar is >150 15 mL 0  . Olopatadine HCl 0.2 % SOLN Place 1 drop as needed into both eyes.    . pantoprazole (PROTONIX) 40 MG tablet Take 1 tablet (40 mg total) by mouth daily at 6 (six) AM. 90 tablet 3  . potassium chloride SA (K-DUR,KLOR-CON) 20 MEQ tablet  Take 1 tablet (20 mEq total) by mouth daily. 30 tablet 2  . Prenatal Vit-Fe Fumarate-FA (PREPLUS) 27-1 MG TABS Take 1 tablet by mouth daily before breakfast. 30 tablet 11  . propranolol (INDERAL) 80 MG tablet TAKE 1 TABLET BY MOUTH TWICE A DAY 30 tablet 11  . fluconazole (DIFLUCAN) 200 MG tablet Take 1 tablet (200 mg total) by mouth every 3 (three) days. 3 tablet 2  . liraglutide (VICTOZA) 18 MG/3ML SOPN Inject 0.1 mLs (0.6 mg total) into the skin every morning for 10 doses. Then call your doctor to discuss your dose moving forward 1 mL 0  . metFORMIN (GLUCOPHAGE XR) 500 MG 24 hr tablet Take 1 tablet (500 mg total) by mouth daily with breakfast for 30 days. 30 tablet 0   No current facility-administered medications for this visit.    Review of Systems Review of Systems Constitutional: negative for fatigue and weight loss Respiratory: negative for cough and wheezing Cardiovascular: negative for chest pain, fatigue and palpitations Gastrointestinal: negative for abdominal pain and change in bowel habits Genitourinary: positive for vaginal discharge and itching Integument/breast: negative for nipple discharge Musculoskeletal:negative for myalgias Neurological: negative for gait problems and tremors Behavioral/Psych: negative for abusive relationship, depression Endocrine: negative for temperature intolerance      Blood pressure (!) 141/83, pulse 75, weight 183 lb (83 kg), last menstrual period 07/23/2014.  Physical Exam           General: Alert and no distress Abdomen:  normal findings: no organomegaly, soft, non-tender and no hernia  Pelvis:  External genitalia: normal general appearance Urinary system: urethral meatus normal and bladder without fullness, nontender Vaginal: normal without Alert tenderness, induration or masses Cervix: normal appearance Adnexa: normal bimanual exam Uterus: anteverted and non-tender, normal size    50% of 15 min visit spent on counseling and coordination of care.   Data Reviewed Wet Prep  Assessment     1. Candida vaginitis Rx: - fluconazole (DIFLUCAN) 200 MG tablet; Take 1 tablet (200 mg total) by mouth every 3 (three) days.  Dispense: 3 tablet; Refill: 2  2. Vaginal discharge Rx - Cervicovaginal ancillary only( [place])  3. Status post TAH-BSO     Plan    Follow up prn  No orders of the defined types were placed in this encounter.  Meds ordered this encounter  Medications  . fluconazole (DIFLUCAN) 200 MG tablet    Sig: Take 1 tablet (200 mg total) by mouth every 3 (three) days.    Dispense:  3 tablet    Refill:  2     Shelly Bombard, MD 06/21/2019 4:30 PM

## 2019-06-21 NOTE — Progress Notes (Signed)
Patient is in the office, states she was recently treated with metrogel and now has vaginal irritation and white discharge.

## 2019-06-23 LAB — CERVICOVAGINAL ANCILLARY ONLY
Bacterial Vaginitis (gardnerella): NEGATIVE
Candida Glabrata: NEGATIVE
Candida Vaginitis: NEGATIVE
Chlamydia: NEGATIVE
Comment: NEGATIVE
Comment: NEGATIVE
Comment: NEGATIVE
Comment: NEGATIVE
Comment: NEGATIVE
Comment: NORMAL
Neisseria Gonorrhea: NEGATIVE
Trichomonas: NEGATIVE

## 2019-06-29 ENCOUNTER — Other Ambulatory Visit: Payer: Self-pay

## 2019-06-29 ENCOUNTER — Observation Stay (HOSPITAL_COMMUNITY)
Admission: EM | Admit: 2019-06-29 | Discharge: 2019-06-30 | Disposition: A | Discharge status: Home / Self Care | Payer: Medicaid Other | Attending: Internal Medicine | Admitting: Internal Medicine

## 2019-06-29 ENCOUNTER — Emergency Department (HOSPITAL_COMMUNITY): Payer: Medicaid Other

## 2019-06-29 ENCOUNTER — Encounter (HOSPITAL_COMMUNITY): Payer: Self-pay

## 2019-06-29 DIAGNOSIS — Z794 Long term (current) use of insulin: Secondary | ICD-10-CM | POA: Diagnosis not present

## 2019-06-29 DIAGNOSIS — K2100 Gastro-esophageal reflux disease with esophagitis, without bleeding: Secondary | ICD-10-CM | POA: Insufficient documentation

## 2019-06-29 DIAGNOSIS — Z95810 Presence of automatic (implantable) cardiac defibrillator: Secondary | ICD-10-CM | POA: Diagnosis not present

## 2019-06-29 DIAGNOSIS — J449 Chronic obstructive pulmonary disease, unspecified: Secondary | ICD-10-CM | POA: Insufficient documentation

## 2019-06-29 DIAGNOSIS — G40A09 Absence epileptic syndrome, not intractable, without status epilepticus: Secondary | ICD-10-CM | POA: Diagnosis present

## 2019-06-29 DIAGNOSIS — Z20822 Contact with and (suspected) exposure to covid-19: Secondary | ICD-10-CM | POA: Diagnosis not present

## 2019-06-29 DIAGNOSIS — I152 Hypertension secondary to endocrine disorders: Secondary | ICD-10-CM | POA: Diagnosis present

## 2019-06-29 DIAGNOSIS — R4701 Aphasia: Secondary | ICD-10-CM | POA: Insufficient documentation

## 2019-06-29 DIAGNOSIS — M329 Systemic lupus erythematosus, unspecified: Secondary | ICD-10-CM | POA: Diagnosis not present

## 2019-06-29 DIAGNOSIS — R072 Precordial pain: Secondary | ICD-10-CM | POA: Diagnosis not present

## 2019-06-29 DIAGNOSIS — R2689 Other abnormalities of gait and mobility: Secondary | ICD-10-CM | POA: Diagnosis not present

## 2019-06-29 DIAGNOSIS — Z79899 Other long term (current) drug therapy: Secondary | ICD-10-CM | POA: Diagnosis not present

## 2019-06-29 DIAGNOSIS — Z880 Allergy status to penicillin: Secondary | ICD-10-CM | POA: Insufficient documentation

## 2019-06-29 DIAGNOSIS — H538 Other visual disturbances: Secondary | ICD-10-CM

## 2019-06-29 DIAGNOSIS — Z7951 Long term (current) use of inhaled steroids: Secondary | ICD-10-CM | POA: Insufficient documentation

## 2019-06-29 DIAGNOSIS — H539 Unspecified visual disturbance: Secondary | ICD-10-CM | POA: Diagnosis not present

## 2019-06-29 DIAGNOSIS — D649 Anemia, unspecified: Secondary | ICD-10-CM | POA: Diagnosis not present

## 2019-06-29 DIAGNOSIS — E1140 Type 2 diabetes mellitus with diabetic neuropathy, unspecified: Secondary | ICD-10-CM | POA: Insufficient documentation

## 2019-06-29 DIAGNOSIS — Z8249 Family history of ischemic heart disease and other diseases of the circulatory system: Secondary | ICD-10-CM | POA: Insufficient documentation

## 2019-06-29 DIAGNOSIS — E119 Type 2 diabetes mellitus without complications: Secondary | ICD-10-CM

## 2019-06-29 DIAGNOSIS — R531 Weakness: Secondary | ICD-10-CM | POA: Diagnosis not present

## 2019-06-29 DIAGNOSIS — D869 Sarcoidosis, unspecified: Secondary | ICD-10-CM | POA: Insufficient documentation

## 2019-06-29 DIAGNOSIS — Z881 Allergy status to other antibiotic agents status: Secondary | ICD-10-CM | POA: Diagnosis not present

## 2019-06-29 DIAGNOSIS — R29818 Other symptoms and signs involving the nervous system: Secondary | ICD-10-CM | POA: Diagnosis not present

## 2019-06-29 DIAGNOSIS — M797 Fibromyalgia: Secondary | ICD-10-CM | POA: Diagnosis not present

## 2019-06-29 DIAGNOSIS — M542 Cervicalgia: Secondary | ICD-10-CM

## 2019-06-29 DIAGNOSIS — Z888 Allergy status to other drugs, medicaments and biological substances status: Secondary | ICD-10-CM | POA: Diagnosis not present

## 2019-06-29 DIAGNOSIS — Z833 Family history of diabetes mellitus: Secondary | ICD-10-CM | POA: Insufficient documentation

## 2019-06-29 DIAGNOSIS — R5382 Chronic fatigue, unspecified: Secondary | ICD-10-CM | POA: Diagnosis present

## 2019-06-29 DIAGNOSIS — I10 Essential (primary) hypertension: Secondary | ICD-10-CM | POA: Diagnosis present

## 2019-06-29 DIAGNOSIS — R270 Ataxia, unspecified: Secondary | ICD-10-CM | POA: Insufficient documentation

## 2019-06-29 DIAGNOSIS — G43009 Migraine without aura, not intractable, without status migrainosus: Secondary | ICD-10-CM | POA: Diagnosis present

## 2019-06-29 DIAGNOSIS — R29898 Other symptoms and signs involving the musculoskeletal system: Secondary | ICD-10-CM

## 2019-06-29 DIAGNOSIS — M1990 Unspecified osteoarthritis, unspecified site: Secondary | ICD-10-CM | POA: Diagnosis not present

## 2019-06-29 DIAGNOSIS — Z8673 Personal history of transient ischemic attack (TIA), and cerebral infarction without residual deficits: Secondary | ICD-10-CM | POA: Diagnosis not present

## 2019-06-29 DIAGNOSIS — R269 Unspecified abnormalities of gait and mobility: Secondary | ICD-10-CM | POA: Diagnosis present

## 2019-06-29 LAB — CBC
HCT: 39.5 % (ref 36.0–46.0)
Hemoglobin: 13.5 g/dL (ref 12.0–15.0)
MCH: 30.1 pg (ref 26.0–34.0)
MCHC: 34.2 g/dL (ref 30.0–36.0)
MCV: 88.2 fL (ref 80.0–100.0)
Platelets: 223 10*3/uL (ref 150–400)
RBC: 4.48 MIL/uL (ref 3.87–5.11)
RDW: 12 % (ref 11.5–15.5)
WBC: 8 10*3/uL (ref 4.0–10.5)
nRBC: 0 % (ref 0.0–0.2)

## 2019-06-29 LAB — DIFFERENTIAL
Abs Immature Granulocytes: 0.03 10*3/uL (ref 0.00–0.07)
Basophils Absolute: 0.1 10*3/uL (ref 0.0–0.1)
Basophils Relative: 1 %
Eosinophils Absolute: 0.1 10*3/uL (ref 0.0–0.5)
Eosinophils Relative: 1 %
Immature Granulocytes: 0 %
Lymphocytes Relative: 40 %
Lymphs Abs: 3.2 10*3/uL (ref 0.7–4.0)
Monocytes Absolute: 0.6 10*3/uL (ref 0.1–1.0)
Monocytes Relative: 8 %
Neutro Abs: 4 10*3/uL (ref 1.7–7.7)
Neutrophils Relative %: 50 %

## 2019-06-29 LAB — COMPREHENSIVE METABOLIC PANEL
ALT: 18 U/L (ref 0–44)
AST: 24 U/L (ref 15–41)
Albumin: 3.6 g/dL (ref 3.5–5.0)
Alkaline Phosphatase: 82 U/L (ref 38–126)
Anion gap: 12 (ref 5–15)
BUN: 5 mg/dL — ABNORMAL LOW (ref 6–20)
CO2: 22 mmol/L (ref 22–32)
Calcium: 9.3 mg/dL (ref 8.9–10.3)
Chloride: 102 mmol/L (ref 98–111)
Creatinine, Ser: 0.71 mg/dL (ref 0.44–1.00)
GFR calc Af Amer: >60 mL/min (ref >60)
GFR calc non Af Amer: >60 mL/min (ref >60)
Glucose, Bld: 342 mg/dL — ABNORMAL HIGH (ref 70–99)
Potassium: 3.6 mmol/L (ref 3.5–5.1)
Sodium: 136 mmol/L (ref 135–145)
Total Bilirubin: 0.9 mg/dL (ref 0.3–1.2)
Total Protein: 7.3 g/dL (ref 6.5–8.1)

## 2019-06-29 LAB — I-STAT CHEM 8, ED
BUN: 4 mg/dL — ABNORMAL LOW (ref 6–20)
Calcium, Ion: 1.1 mmol/L — ABNORMAL LOW (ref 1.15–1.40)
Chloride: 101 mmol/L (ref 98–111)
Creatinine, Ser: 0.4 mg/dL — ABNORMAL LOW (ref 0.44–1.00)
Glucose, Bld: 330 mg/dL — ABNORMAL HIGH (ref 70–99)
HCT: 38 % (ref 36.0–46.0)
Hemoglobin: 12.9 g/dL (ref 12.0–15.0)
Potassium: 3.5 mmol/L (ref 3.5–5.1)
Sodium: 136 mmol/L (ref 135–145)
TCO2: 24 mmol/L (ref 22–32)

## 2019-06-29 LAB — PROTIME-INR
INR: 1 (ref 0.8–1.2)
Prothrombin Time: 13.5 seconds (ref 11.4–15.2)

## 2019-06-29 LAB — I-STAT BETA HCG BLOOD, ED (MC, WL, AP ONLY): I-stat hCG, quantitative: <5 m[IU]/mL (ref <5)

## 2019-06-29 LAB — CBG MONITORING, ED: Glucose-Capillary: 346 mg/dL — ABNORMAL HIGH (ref 70–99)

## 2019-06-29 LAB — APTT: aPTT: 30 seconds (ref 24–36)

## 2019-06-29 MED ORDER — LEVETIRACETAM IN NACL 1000 MG/100ML IV SOLN
1000.0000 mg | Freq: Once | INTRAVENOUS | Status: AC
  Administered 2019-06-29: 1000 mg via INTRAVENOUS
  Filled 2019-06-29: qty 100

## 2019-06-29 MED ORDER — LACTATED RINGERS IV BOLUS
1000.0000 mL | Freq: Once | INTRAVENOUS | Status: AC
  Administered 2019-06-29: 1000 mL via INTRAVENOUS

## 2019-06-29 MED ORDER — VALPROATE SODIUM 500 MG/5ML IV SOLN: 500.0000 mg | Freq: Once | INTRAVENOUS | Status: DC

## 2019-06-29 MED ORDER — ACETAMINOPHEN 500 MG PO TABS
1000.0000 mg | ORAL_TABLET | Freq: Once | ORAL | Status: AC
  Administered 2019-06-29: 1000 mg via ORAL
  Filled 2019-06-29: qty 2

## 2019-06-29 MED ORDER — SODIUM CHLORIDE 0.9% FLUSH
3.0000 mL | Freq: Once | INTRAVENOUS | Status: DC
Start: 2019-06-29 — End: 2019-06-30

## 2019-06-29 MED ORDER — MAGNESIUM SULFATE 2 GM/50ML IV SOLN
2.0000 g | Freq: Once | INTRAVENOUS | Status: AC
  Administered 2019-06-29: 2 g via INTRAVENOUS
  Filled 2019-06-29: qty 50

## 2019-06-29 MED ORDER — METHYLPREDNISOLONE SODIUM SUCC 125 MG IJ SOLR
125.0000 mg | Freq: Once | INTRAMUSCULAR | Status: AC
  Administered 2019-06-29: 125 mg via INTRAVENOUS
  Filled 2019-06-29: qty 2

## 2019-06-29 NOTE — ED Triage Notes (Signed)
Daughter reported sudden onset aphasia this evening , LSN 7pm this evening , evaluated by EDP at arrival - Code Stroke  paged by Network engineer , transported to CT scan with RN/EMT.

## 2019-06-29 NOTE — ED Notes (Deleted)
Patient verbalizes understanding of discharge instructions. Opportunity for questioning and answers were provided. Armband removed by staff, pt discharged from ED.  

## 2019-06-29 NOTE — ED Notes (Signed)
Called code stroke 

## 2019-06-29 NOTE — ED Provider Notes (Signed)
[facility]GENC[facility] DEPARTMENT Provider Note   CSN: [id number] Arrival date & time: 06/29/19  2027  An emergency department physician performed an initial assessment on this suspected stroke patient at 2349.  History Chief Complaint  Patient presents with  . Code Stroke    [name] is a 49 y.o. female.  Past medical history of lupus, sarcoidosis, Long QT with defibrillator.    The history is provided by a relative. The history is limited by the condition of the patient.  Neurologic Problem This is a new problem. Episode onset: 7PM tonight Last known normal. The problem occurs constantly. The problem has not changed since onset.Associated symptoms include headaches. Associated symptoms comments: Speech difficulty, altered mental status, generalized weakness.. Nothing aggravates the symptoms. Nothing relieves the symptoms. She has tried nothing for the symptoms.  Per daughter pt with difficulty with speech, generalized weakness, altered mental status at [facility], daughter had been texting with pt and seemed to be normal just prior to this.       Past Medical History:  Diagnosis Date  . Abdominal pain, periumbilic   . Allergic rhinitis   . Allergy   . Anemia   . Anxiety   . Arthritis   . Asthma   . Automatic implantable cardioverter-defibrillator in situ 2006, replaced 2008   MEDTRONIC  . Bronchitis   . Cancer ([place])    Nodules in lungs pt believes were cancerous pt unsure  . COPD (chronic obstructive pulmonary disease) ([place])   . DM (diabetes mellitus) ([place]) 1997/1998   type 1, initially gestational but soon after child birth developed IDDM  . Esophageal stricture 2005/2009   esophageal strictures dilated 2005, 2009  . Eye hemorrhage    bilateral  . Fibroid, uterine   . Fibromyalgia   . GERD (gastroesophageal reflux disease) 2005  . History of blood transfusion years ago  . Hypertension   . Internal hemorrhoids 2010   2011 band ligation of int  rrhoids.  . Long Q-T syndrome   . Neuropathy in diabetes ([place])   . Palpitation   . Sarcoidosis   . Seizure disorder ([place])    none recently  . Stroke ([place])    QUESTIONABLE TIA     Patient Active Problem List   Diagnosis Date Noted  . Nodule of skin of left upper extremity 01/18/2019  . Pain of left hip joint 07/31/2018  . Migraine without aura and without status migrainosus, not intractable 02/16/2017  . Blurry vision, bilateral 12/30/2016  . Chronic daily headache 11/09/2016  . Neck pain 07/20/2016  . Lower extremity weakness 06/17/2016  . 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 Occipital neuralgia of left side 03/05/2016  . Other headache syndrome 03/05/2016  . Gait disturbance 02/12/2015  . 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 Chronic fatigue 01/01/2015  . Nausea without vomiting 12/11/2014  . Dyspnea 12/10/2014  . Weight loss   . Long QT interval 11/06/2014  . PSVT 02/24/2010  . PRECORDIAL PAIN 08/22/2009  . Palpitations 10/05/2008  . Automatic implantable cardioverter-defibrillator in situ 10/05/2008  . Absence seizure disorder ([place]) 09/26/2008  . Constipation 02/08/2008  . Sarcoidosis, stage 3 ([place]) 03/15/2007  . Diabetes mellitus, type 2 ([place]) 03/15/2007  . Essential hypertension 03/15/2007  . SYNDROME, LONG QT 03/15/2007  . ALLERGIC RHINITIS 03/15/2007  . OBSTRUCTIVE CHRONIC BRONCHITIS 03/15/2007  . ESOPHAGITIS 02/05/2004  . ESOPHAGEAL STRICTURE 02/05/2004    Past Surgical History:  Procedure Laterality Date  .  AUTOMATIC IMPLANTABLE CARDIAC DEFIBRILLATOR SITU  2006,    ICD-Medtronic   Remote - Yes   . CERVIX REMOVAL    . DILITATION & CURRETTAGE/HYSTROSCOPY WITH HYDROTHERMAL ABLATION N/A 06/07/2013   Procedure: DILATATION & CURETTAGE/HYSTEROSCOPY WITH attempted HYDROTHERMAL ABLATION;  Surgeon: Frederico Hamman, MD;  Location: [facility];  Service: Gynecology;  Laterality: N/A;  . EP IMPLANTABLE DEVICE N/A 04/04/2015    Procedure:  ICD Generator Changeout;  Surgeon: Evans Lance, MD;  Location: [facility];  Service: Cardiovascular;  Laterality: N/A;  . ESOPHAGOGASTRODUODENOSCOPY (EGD) WITH PROPOFOL N/A 10/24/2014   Procedure: ESOPHAGOGASTRODUODENOSCOPY (EGD) WITH PROPOFOL;  Surgeon: Inda Castle, MD;  Location: [facility];  Service: Endoscopy;  Laterality: N/A;  . EXPLORATORY LAPAROTOMY  11/06/14   evacuation of post TAH/BSO hematoma  . HAND SURGERY Right    x 2  . HEMORRHOID BANDING  03/2010  . SALPINGECTOMY    . SVT ablation    . TOTAL ABDOMINAL HYSTERECTOMY W/ BILATERAL SALPINGOOPHORECTOMY  11/06/14   at [facility] for menorrhagia, pelvic pain and enlarging uterine fibroids  . TUBAL LIGATION    . VIDEO BRONCHOSCOPY Bilateral 01/24/2015   Procedure: VIDEO BRONCHOSCOPY WITHOUT FLUORO;  Surgeon: Marshell Garfinkel, MD;  Location: [facility];  Service: Cardiopulmonary;  Laterality: Bilateral;     OB History    Gravida  6   Para  4   Term  4   Preterm      AB  2   Living  3     SAB  2   TAB      Ectopic      Multiple      Live Births  4           Family History  Problem Relation Age of Onset  . Heart attack Mother   . Lung cancer Maternal Uncle        x2  . Breast cancer Maternal Aunt        x2  . Stomach cancer Paternal Uncle   . Stomach cancer Paternal Grandfather   . Stomach cancer Paternal Grandmother   . Diabetes Father   . Positive PPD/TB Exposure Father   . Glaucoma Father   . Esophageal cancer Paternal Aunt   . Colon cancer Maternal Grandfather     Social History   Tobacco Use  . Smoking status: Never Smoker  . Smokeless tobacco: Never Used  Substance Use Topics  . Alcohol use: Yes    Alcohol/week: 0.0 standard drinks    Comment: None since 2015-was occasional  . Drug use: No    Home Medications Prior to Admission medications   Medication Sig Start Date End Date Taking? Authorizing Provider  ACCU-CHEK SOFTCLIX LANCETS lancets Use as instructed  04/05/18   Sherene Sires, DO  acetaminophen (TYLENOL) 500 MG tablet Take 1,000 mg by mouth every 6 (six) hours as needed for mild pain or fever. Reported on 07/22/2015    [provider]  baclofen (LIORESAL) 10 MG tablet Take 1 tablet (10 mg total) by mouth 2 (two) times daily. 03/08/18   Sherene Sires, DO  beclomethasone (QVAR) 80 MCG/ACT inhaler Inhale 2 puffs into the lungs 2 (two) times daily. 03/08/18   Sherene Sires, DO  clonazePAM (KLONOPIN) 0.5 MG tablet Take 1 tablet (0.5 mg total) by mouth every other day as needed for anxiety. 03/08/18   Sherene Sires, DO  cycloSPORINE (RESTASIS) 0.05 % ophthalmic emulsion Place 1 drop daily into both eyes.    [provider]  fluconazole (DIFLUCAN) 200 MG tablet Take 1 tablet (200 mg total) by mouth every 3 (three) days. 06/21/19   Shelly Bombard, MD  fluticasone (FLONASE) 50 MCG/ACT nasal spray Place 2 sprays into both nostrils daily as needed for allergies.     [provider]  gabapentin (NEURONTIN) 300 MG capsule Take 1 capsule (300 mg total) by mouth 3 (three) times daily. 07/21/17   Nicolette Bang, DO  glucose blood (ACCU-CHEK GUIDE) test strip Use as instructed 04/05/18   Sherene Sires, DO  Insulin Degludec (TRESIBA FLEXTOUCH) 200 UNIT/ML SOPN Inject 60 Units into the skin daily at 6 (six) AM. 05/17/19   Lockamy, Timothy, DO  Insulin Pen Needle 31G X 8 MM MISC BD UltraFine III Pen Needles. For use with insulin pen device. Inject insulin 6 x daily 04/05/18   Sherene Sires, DO  lamoTRIgine (LAMICTAL) 150 MG tablet Take 1 tablet (150 mg total) by mouth 2 (two) times daily. 02/17/18   Cameron Sprang, MD  Lancet Devices [facility]) lancets Use as instructed 10/20/16   Nicolette Bang, DO  levETIRAcetam (KEPPRA) 500 MG tablet Take 1 tablet (500 mg total) by mouth 2 (two) times daily. 02/17/18   Cameron Sprang, MD  linaclotide Rolan Lipa) 290 MCG CAPS capsule Take 1 capsule (290 mcg total) by mouth daily before  breakfast. 04/10/19   Armbruster, Carlota Raspberry, MD  liraglutide (VICTOZA) 18 MG/3ML SOPN Inject 0.1 mLs (0.6 mg total) into the skin every morning for 10 doses. Then call your doctor to discuss your dose moving forward 03/08/18 03/18/18  Sherene Sires, DO  meclizine (ANTIVERT) 32 MG tablet Take 1 tablet (32 mg total) by mouth 3 (three) times daily as needed. 12/30/16   Kathrene Alu, MD  metFORMIN (GLUCOPHAGE XR) 500 MG 24 hr tablet Take 1 tablet (500 mg total) by mouth daily with breakfast for 30 days. 07/29/18 08/28/18  Sherene Sires, DO  metroNIDAZOLE (METROGEL) 0.75 % vaginal gel PLACE 1 APPLICATORFUL VAGINALLY TWICE DAILY 04/17/19   Shelly Bombard, MD  naproxen (NAPROSYN) 375 MG tablet Take 1 tablet (375 mg total) by mouth 2 (two) times daily with a meal. 03/08/18   Bland, Scott, DO  NOVOLOG FLEXPEN 100 UNIT/ML FlexPen Inject 12u up to 3 times per day with meals as long as sugar is >150 07/29/18   Sherene Sires, DO  Olopatadine HCl 0.2 % SOLN Place 1 drop as needed into both eyes.    [provider]  pantoprazole (PROTONIX) 40 MG tablet Take 1 tablet (40 mg total) by mouth daily at 6 (six) AM. 04/01/18   Armbruster, Carlota Raspberry, MD  potassium chloride SA (K-DUR,KLOR-CON) 20 MEQ tablet Take 1 tablet (20 mEq total) by mouth daily. 03/08/18   Sherene Sires, DO  Prenatal Vit-Fe Fumarate-FA (PREPLUS) 27-1 MG TABS Take 1 tablet by mouth daily before breakfast. 05/23/18   Shelly Bombard, MD  propranolol (INDERAL) 80 MG tablet TAKE 1 TABLET BY MOUTH TWICE A DAY 08/17/18   Evans Lance, MD    Allergies    Contrast media [iodinated diagnostic agents], Iohexol, Augmentin [amoxicillin-pot clavulanate], Codeine, Demerol [meperidine], and Morphine and related  Review of Systems   Review of Systems  Unable to perform ROS: Mental status change  Neurological: Positive for headaches.    Physical Exam Updated Vital Signs BP (!) 149/84   Pulse 85   Resp 17   Ht 5\' 7"  (1.702 m)   Wt 83.9 kg   LMP  07/23/2014   SpO2 98%   BMI 28.97 kg/m   Physical Exam Vitals and nursing note reviewed.  Constitutional:      General: She is in acute distress.     Appearance: She is well-developed.  HENT:     Head: Normocephalic and atraumatic.     Mouth/Throat:     Mouth: Mucous membranes are moist.     Pharynx: Oropharynx is clear.  Eyes:     General: No scleral icterus.    Conjunctiva/sclera: Conjunctivae normal.  Cardiovascular:     Rate and Rhythm: Normal rate and regular rhythm.     Pulses: Normal pulses.     Heart sounds: No murmur.  Pulmonary:     Effort: Pulmonary effort is normal. No respiratory distress.     Breath sounds: Normal breath sounds.  Abdominal:     Palpations: Abdomen is soft.     Tenderness: There is no abdominal tenderness.  Musculoskeletal:     Cervical back: Neck supple.  Skin:    General: Skin is warm and dry.  Neurological:     Mental Status: She is alert.     Cranial Nerves: No cranial nerve deficit.     Sensory: No sensory deficit.     Motor: Weakness (4/5 strength throughout, no obvious focal weakness) present.     Coordination: Coordination abnormal.     Gait: Gait abnormal.     Comments: Limited exam secondary to altered mental status     ED Results / Procedures / Treatments   Labs (all labs ordered are listed, but only abnormal results are displayed) Labs Reviewed  COMPREHENSIVE METABOLIC PANEL - Abnormal; Notable for the following components:      Result Value   Glucose, Bld 342 (*)    BUN 5 (*)    All other components within normal limits  CBG MONITORING, ED - Abnormal; Notable for the following components:   Glucose-Capillary 346 (*)    All other components within normal limits  I-STAT CHEM 8, ED - Abnormal; Notable for the following components:   BUN 4 (*)    Creatinine, Ser 0.40 (*)    Glucose, Bld 330 (*)    Calcium, Ion 1.10 (*)    All other components within normal limits  PROTIME-INR  APTT  CBC  DIFFERENTIAL  CBG  MONITORING, ED  I-STAT BETA HCG BLOOD, ED ([facility], [facility], [facility] ONLY)    EKG None  Radiology CT HEAD CODE STROKE WO CONTRAST  Result Date: 06/29/2019 CLINICAL DATA:  Code stroke.  Aphasia EXAM: CT HEAD WITHOUT CONTRAST TECHNIQUE: Contiguous axial images were obtained from the base of the skull through the vertex without intravenous contrast. COMPARISON:  11/11/2016 head CT FINDINGS: Brain: There is no mass, hemorrhage or extra-axial collection. The size and configuration of the ventricles and extra-axial CSF spaces are normal. The brain parenchyma is normal, without evidence of acute or chronic infarction. Vascular: No abnormal hyperdensity of the major intracranial arteries or dural venous sinuses. No intracranial atherosclerosis. Skull: The visualized skull base, calvarium and extracranial soft tissues are normal. Sinuses/Orbits: No fluid levels or advanced mucosal thickening of the visualized paranasal sinuses. No mastoid or middle ear effusion. The orbits are normal. ASPECTS [facility] Stroke Program Early CT Score) - Ganglionic level infarction (caudate, lentiform nuclei, internal capsule, insula, M1-M3 cortex): 7 - Supraganglionic infarction (M4-M6 cortex): 3 Total score (0-10 with 10 being normal): 10 IMPRESSION: 1. No acute abnormality 2. ASPECTS is 10 3. These results were communicated to Dr. Amie Portland  at 9:12 pm on 06/29/2019 by text page via the [facility] messaging system. Electronically Signed   By: Ulyses Jarred M.D.   On: 06/29/2019 21:13    Procedures Procedures (including critical care time)  Medications Ordered in ED   ED Course  I have reviewed the triage vital signs and the nursing notes.  Pertinent labs & imaging results that were available during my care of the patient were reviewed by me and considered in my medical decision making (see chart for details).    MDM Rules/Calculators/A&P                      The patient is a 49 year old female with past medical history of  sarcoidosis, lupus, fibromyalgia, seizures, TIA, long QT syndrome status post defibrillator, migraines, diabetes who presents to the ED for altered mental status and difficulty with speech.  Patient was a code stroke upon arrival, immediately evaluated on arrival and found to have intact airway, difficulty with speech, difficulty following commands, immediately sent to the CT scanner and neurology saw her at the scanner.  Differential includes CVA, complex migraine, psychogenic symptoms, delirium.  CT without evidence of acute bleed or infarct.  Per neurology most likely complex migraine vs psychogenic weakness.  Given fluids, magnesium, tylenol, solu-medrol for headache.  Differed compazine/reglan due to long qt syndrome.    Rechecked the pt who continues to not be at her mental status baseline.  Attempted ambulation, pt with unsteady gait and daughter concerned for her safety caring for her at home at this time. Will consult hospital medicine for admission and further evaluation.     Final Clinical Impression(s) / ED Diagnoses Final diagnoses:  Blurry vision, bilateral  Neck pain  Weakness of both lower extremities  Precordial pain    Rx / DC Orders ED Discharge Orders    None       Michelena Culmer, [place], MD 06/30/19 1947    Lucrezia Starch, MD 06/30/19 2300

## 2019-06-29 NOTE — Consult Note (Addendum)
Neurology Consultation  Reason for Consult: Code stroke for aphasia Referring Physician: Dr. Roslynn Amble  CC: Less responsive, unable to talk  History is obtained from: Patient's daughter, chart  HPI: [name] is a 49 y.o. female past medical history of sarcoid, lupus, fibromyalgia, seizures-possibly JME versus generalized epilepsy, questionable TIA, long QT syndrome, V. tach status post pacemaker placement, migraines, diabetes, presented to the emergency room for sudden onset of inability to talk and decreased responsiveness. According to the daughter, she received a text message from her mother saying that she is not feeling well around 7 PM saying that she is not feeling well.  On baseline days, she has some good days and bad days where she requires help with daily work some the family on her bad days and is able to do most of her ADLs independently on her good days.  No recent illnesses or sicknesses.  Granddaughter got home, the patient was very responsive and not able to talk.  She could not follow her.  Brought into the emergency room, evaluated by ED provider and control called because the patient was not talking or following commands-concern for stroke.  She was seen and examined in the CT scanner On initial encounter in the CT, she did not provide much history but later was able to answer appropriately to questions and was nonfocal. No seizures were witnessed but there was report of urinary incontinence. Patient reports of severe headaches after completing imaging and feeling more awake.  LKW: Unclear-to the best of ability 7 PM on 06/29/2019 tpa given?: no, nonfocal exam Premorbid modified Rankin scale (mRS): 2  ROS: Review of systems was performed and is negative except as noted in the HPI.    Past Medical History:  Diagnosis Date  . Abdominal pain, periumbilic   . Allergic rhinitis   . Allergy   . Anemia   . Anxiety   . Arthritis   . Asthma   . Automatic implantable  cardioverter-defibrillator in situ 2006, replaced 2008   [employer]  . Bronchitis   . Cancer ([place])    Nodules in lungs pt believes were cancerous pt unsure  . COPD (chronic obstructive pulmonary disease) ([place])   . DM (diabetes mellitus) ([place]) 1997/1998   type 1, initially gestational but soon after child birth developed IDDM  . Esophageal stricture 2005/2009   esophageal strictures dilated 2005, 2009  . Eye hemorrhage    bilateral  . Fibroid, uterine   . Fibromyalgia   . GERD (gastroesophageal reflux disease) 2005  . History of blood transfusion years ago  . Hypertension   . Internal hemorrhoids 2010   2011 band ligation of int rrhoids.  . Long Q-T syndrome   . Neuropathy in diabetes ([place])   . Palpitation   . Sarcoidosis   . Seizure disorder ([place])    none recently  . Stroke ([place])    QUESTIONABLE TIA     Family History  Problem Relation Age of Onset  . Heart attack Mother   . Lung cancer Maternal Uncle        x2  . Breast cancer Maternal Aunt        x2  . Stomach cancer Paternal Uncle   . Stomach cancer Paternal Grandfather   . Stomach cancer Paternal Grandmother   . Diabetes Father   . Positive PPD/TB Exposure Father   . Glaucoma Father   . Esophageal cancer Paternal Aunt   . Colon cancer Maternal Grandfather     Social History:  reports that she has never smoked. She has never used smokeless tobacco. She reports current alcohol use. She reports that she does not use drugs.  Medications  Current Facility-Administered Medications:  .  acetaminophen (TYLENOL) tablet 1,000 mg, 1,000 mg, Oral, Once, Yong Channel, [place], MD .  magnesium sulfate IVPB 2 g 50 mL, 2 g, Intravenous, Once, Yong Channel, [place], MD .  methylPREDNISolone sodium succinate (SOLU-MEDROL) 125 mg/2 mL injection 125 mg, 125 mg, Intravenous, Once, Dykstra, Richard S, MD .  sodium chloride flush (NS) 0.9 % injection 3 mL, 3 mL, Intravenous, Once, Lucrezia Starch, MD  Current Outpatient Medications:  .   ACCU-CHEK SOFTCLIX LANCETS lancets, Use as instructed, Disp: 100 each, Rfl: 12 .  acetaminophen (TYLENOL) 500 MG tablet, Take 1,000 mg by mouth every 6 (six) hours as needed for mild pain or fever. Reported on 07/22/2015, Disp: , Rfl:  .  baclofen (LIORESAL) 10 MG tablet, Take 1 tablet (10 mg total) by mouth 2 (two) times daily., Disp: 60 each, Rfl: 0 .  beclomethasone (QVAR) 80 MCG/ACT inhaler, Inhale 2 puffs into the lungs 2 (two) times daily., Disp: 1 Inhaler, Rfl: 5 .  clonazePAM (KLONOPIN) 0.5 MG tablet, Take 1 tablet (0.5 mg total) by mouth every other day as needed for anxiety., Disp: 15 tablet, Rfl: 0 .  cycloSPORINE (RESTASIS) 0.05 % ophthalmic emulsion, Place 1 drop daily into both eyes., Disp: , Rfl:  .  fluconazole (DIFLUCAN) 200 MG tablet, Take 1 tablet (200 mg total) by mouth every 3 (three) days., Disp: 3 tablet, Rfl: 2 .  fluticasone (FLONASE) 50 MCG/ACT nasal spray, Place 2 sprays into both nostrils daily as needed for allergies. , Disp: , Rfl:  .  gabapentin (NEURONTIN) 300 MG capsule, Take 1 capsule (300 mg total) by mouth 3 (three) times daily., Disp: 90 capsule, Rfl: 2 .  glucose blood (ACCU-CHEK GUIDE) test strip, Use as instructed, Disp: 100 each, Rfl: 12 .  Insulin Degludec (TRESIBA FLEXTOUCH) 200 UNIT/ML SOPN, Inject 60 Units into the skin daily at 6 (six) AM., Disp: 3 pen, Rfl: 3 .  Insulin Pen Needle 31G X 8 MM MISC, BD UltraFine III Pen Needles. For use with insulin pen device. Inject insulin 6 x daily, Disp: 200 each, Rfl: 3 .  lamoTRIgine (LAMICTAL) 150 MG tablet, Take 1 tablet (150 mg total) by mouth 2 (two) times daily., Disp: 60 tablet, Rfl: 5 .  Lancet Devices (ACCU-CHEK SOFTCLIX) lancets, Use as instructed, Disp: 1 each, Rfl: 0 .  levETIRAcetam (KEPPRA) 500 MG tablet, Take 1 tablet (500 mg total) by mouth 2 (two) times daily., Disp: 180 tablet, Rfl: 3 .  linaclotide (LINZESS) 290 MCG CAPS capsule, Take 1 capsule (290 mcg total) by mouth daily before breakfast., Disp:  90 capsule, Rfl: 3 .  liraglutide (VICTOZA) 18 MG/3ML SOPN, Inject 0.1 mLs (0.6 mg total) into the skin every morning for 10 doses. Then call your doctor to discuss your dose moving forward, Disp: 1 mL, Rfl: 0 .  meclizine (ANTIVERT) 32 MG tablet, Take 1 tablet (32 mg total) by mouth 3 (three) times daily as needed., Disp: 30 tablet, Rfl: 0 .  metFORMIN (GLUCOPHAGE XR) 500 MG 24 hr tablet, Take 1 tablet (500 mg total) by mouth daily with breakfast for 30 days., Disp: 30 tablet, Rfl: 0 .  metroNIDAZOLE (METROGEL) 0.75 % vaginal gel, PLACE 1 APPLICATORFUL VAGINALLY TWICE DAILY, Disp: 70 g, Rfl: 2 .  naproxen (NAPROSYN) 375 MG tablet, Take 1 tablet (375 mg total) by mouth  2 (two) times daily with a meal., Disp: 60 tablet, Rfl: 3 .  NOVOLOG FLEXPEN 100 UNIT/ML FlexPen, Inject 12u up to 3 times per day with meals as long as sugar is >150, Disp: 15 mL, Rfl: 0 .  Olopatadine HCl 0.2 % SOLN, Place 1 drop as needed into both eyes., Disp: , Rfl:  .  pantoprazole (PROTONIX) 40 MG tablet, Take 1 tablet (40 mg total) by mouth daily at 6 (six) AM., Disp: 90 tablet, Rfl: 3 .  potassium chloride SA (K-DUR,KLOR-CON) 20 MEQ tablet, Take 1 tablet (20 mEq total) by mouth daily., Disp: 30 tablet, Rfl: 2 .  Prenatal Vit-Fe Fumarate-FA (PREPLUS) 27-1 MG TABS, Take 1 tablet by mouth daily before breakfast., Disp: 30 tablet, Rfl: 11 .  propranolol (INDERAL) 80 MG tablet, TAKE 1 TABLET BY MOUTH TWICE A DAY, Disp: 30 tablet, Rfl: 11  Exam: Current vital signs: BP (!) 149/84   Pulse 85   Resp 17   Ht 5\' 7"  (1.702 m)   Wt 83.9 kg   LMP 07/23/2014   SpO2 98%   BMI 28.97 kg/m  Vital signs in last 24 hours: Pulse Rate:  [85-88] 85 (02/11 2115) Resp:  [16-17] 17 (02/11 2115) BP: (149-180)/(84-113) 149/84 (02/11 2115) SpO2:  [95 %-98 %] 98 % (02/11 2132) Weight:  [83.9 kg] 83.9 kg (02/11 2132) General: Patient was awake but not answering any questions initially.  Later on started answering some questions as documented  below. HEENT: Normocephalic atraumatic Lungs: Clear to auscultation Cardiovascular: Regular rhythm Abdomen nondistended nontender Neurological exam Mental status: She is awake but did not answer any questions.  On repeated questioning, she started answering questions and have very low tone.  She oriented to place person and time. Hypophonia but no aphasia or dysarthria although there is a little bit of exacerbated lisp in her voice which I am not sure is baseline or new. She is able to follow all commands. Cranial nerves: Pupils equal round react light, extraocular movements intact, visual fields full, face appears symmetric, tongue and palate midline, shoulder shrug intact. Motor exam: She is able to move all 4 extremities-initially did not move any of her extremities but later on started moving all of them antigravity. Sensory exam intact to light touch Coordination: Difficult to perform because of patient's participation but no obvious dysmetria noted on reach for objects. Gait testing deferred at this time. NIHSS 1a Level of Conscious.: 0 1b LOC Questions: 0 1c LOC Commands: 0 2 Best Gaze: 0 3 Visual: 0 4 Facial Palsy: 0 5a Motor Arm - left: 1 5b Motor Arm - Right: 1 6a Motor Leg - Left: 1 6b Motor Leg - Right: 1 7 Limb Ataxia: 0 8 Sensory: 0 9 Best Language: 0 10 Dysarthria: 1 11 Extinct. and Inatten.: 0 TOTAL: 5   Labs I have reviewed labs in epic and the results pertinent to this consultation are: CBC    Component Value Date/Time   WBC 8.0 06/29/2019 2052   RBC 4.48 06/29/2019 2052   HGB 12.9 06/29/2019 2101   HCT 38.0 06/29/2019 2101   PLT 223 06/29/2019 2052   MCV 88.2 06/29/2019 2052   MCH 30.1 06/29/2019 2052   MCHC 34.2 06/29/2019 2052   RDW 12.0 06/29/2019 2052   LYMPHSABS 3.2 06/29/2019 2052   MONOABS 0.6 06/29/2019 2052   EOSABS 0.1 06/29/2019 2052   BASOSABS 0.1 06/29/2019 2052    CMP     Component Value Date/Time   NA 136 06/29/2019  2101   NA  137 07/29/2018 1414   K 3.5 06/29/2019 2101   CL 101 06/29/2019 2101   CO2 22 06/29/2019 2052   GLUCOSE 330 (H) 06/29/2019 2101   BUN 4 (L) 06/29/2019 2101   BUN 4 (L) 07/29/2018 1414   CREATININE 0.40 (L) 06/29/2019 2101   CREATININE 0.41 (L) 03/28/2015 0848   CALCIUM 9.3 06/29/2019 2052   PROT 7.3 06/29/2019 2052   ALBUMIN 3.6 06/29/2019 2052   AST 24 06/29/2019 2052   ALT 18 06/29/2019 2052   ALKPHOS 82 06/29/2019 2052   BILITOT 0.9 06/29/2019 2052   GFRNONAA >60 06/29/2019 2052   GFRAA >60 06/29/2019 2052    Imaging I have reviewed the images obtained:  CT-scan of the brain-no acute changes. CTA could not be performed due to patient's listed anaphylaxis to iodinated contrast and low suspicion for this being an LVO. MRI brain-unclear if the AICD is compatible-not performed.  Assessment: 49 year old with above past medical history presented for inability to talk and respond to questions with last known normal at 7 PM on 06/29/2019.  Initially did not participate much in the exam but later on reported a severe headache and started participating with coaching raising concerns for either a psychogenic etiology versus a complex migraine. No true focality noted on exam.  Low suspicion for LVO hence a CT angiogram was not performed because he has a listed anaphylaxis to the iodinated contrast material. MRI unable to be performed due to AICD at this time. Most likely complex migraine versus psychogenic weakness.  Recommendations: Treat with migraine cocktail and IV fluids. If symptoms improve when she is at baseline, she can be discharged home with outpatient neurology follow-up. Give her a loading dose of Keppra in case this was a seizure followed by postictal phase although no history of seizure being witnessed and the fact that she has not had seizure in a while and has been stable on her home medications. Continue home antiepileptics without change for now. Maintain seizure  precautions Plan discussed with the ED provider and the patient's daughter and patient at bedside.  Please call neurology with questions.  -- Amie Portland, MD [facility] Pager: [phone] If 7pm to 7am, please call on call as listed on AMION.

## 2019-06-30 ENCOUNTER — Telehealth: Payer: Self-pay | Admitting: Internal Medicine

## 2019-06-30 ENCOUNTER — Encounter (HOSPITAL_COMMUNITY): Payer: Self-pay | Admitting: Internal Medicine

## 2019-06-30 DIAGNOSIS — R2681 Unsteadiness on feet: Secondary | ICD-10-CM

## 2019-06-30 DIAGNOSIS — R270 Ataxia, unspecified: Secondary | ICD-10-CM | POA: Diagnosis not present

## 2019-06-30 LAB — GLUCOSE, CAPILLARY
Glucose-Capillary: 318 mg/dL — ABNORMAL HIGH (ref 70–99)
Glucose-Capillary: 314 mg/dL — ABNORMAL HIGH (ref 70–99)

## 2019-06-30 LAB — COMPREHENSIVE METABOLIC PANEL
ALT: 23 U/L (ref 0–44)
AST: 30 U/L (ref 15–41)
Albumin: 3.6 g/dL (ref 3.5–5.0)
Alkaline Phosphatase: 95 U/L (ref 38–126)
Anion gap: 15 (ref 5–15)
BUN: 5 mg/dL — ABNORMAL LOW (ref 6–20)
CO2: 19 mmol/L — ABNORMAL LOW (ref 22–32)
Calcium: 9.3 mg/dL (ref 8.9–10.3)
Chloride: 102 mmol/L (ref 98–111)
Creatinine, Ser: 0.68 mg/dL (ref 0.44–1.00)
GFR calc Af Amer: >60 mL/min (ref >60)
GFR calc non Af Amer: >60 mL/min (ref >60)
Glucose, Bld: 328 mg/dL — ABNORMAL HIGH (ref 70–99)
Potassium: 4.2 mmol/L (ref 3.5–5.1)
Sodium: 136 mmol/L (ref 135–145)
Total Bilirubin: 0.9 mg/dL (ref 0.3–1.2)
Total Protein: 7.6 g/dL (ref 6.5–8.1)

## 2019-06-30 LAB — CBC
HCT: 43.8 % (ref 36.0–46.0)
Hemoglobin: 14.6 g/dL (ref 12.0–15.0)
MCH: 29.9 pg (ref 26.0–34.0)
MCHC: 33.3 g/dL (ref 30.0–36.0)
MCV: 89.6 fL (ref 80.0–100.0)
Platelets: 254 10*3/uL (ref 150–400)
RBC: 4.89 MIL/uL (ref 3.87–5.11)
RDW: 12.1 % (ref 11.5–15.5)
WBC: 9.2 10*3/uL (ref 4.0–10.5)
nRBC: 0 % (ref 0.0–0.2)

## 2019-06-30 LAB — SARS CORONAVIRUS 2 (TAT 6-24 HRS): SARS Coronavirus 2: NEGATIVE

## 2019-06-30 LAB — HEMOGLOBIN A1C
Hgb A1c MFr Bld: 9.1 % — ABNORMAL HIGH (ref 4.8–5.6)
Mean Plasma Glucose: 214.47 mg/dL

## 2019-06-30 LAB — HIV ANTIBODY (ROUTINE TESTING W REFLEX): HIV Screen 4th Generation wRfx: NONREACTIVE

## 2019-06-30 MED ORDER — LEVETIRACETAM 500 MG PO TABS
500.0000 mg | ORAL_TABLET | Freq: Two times a day (BID) | ORAL | Status: DC
  Administered 2019-06-30: 500 mg via ORAL
  Filled 2019-06-30: qty 1

## 2019-06-30 MED ORDER — SODIUM CHLORIDE 0.9% FLUSH
3.0000 mL | Freq: Two times a day (BID) | INTRAVENOUS | Status: DC
  Administered 2019-06-30: 3 mL via INTRAVENOUS

## 2019-06-30 MED ORDER — INSULIN ASPART 100 UNIT/ML ~~LOC~~ SOLN
0.0000 [IU] | Freq: Three times a day (TID) | SUBCUTANEOUS | Status: DC
  Administered 2019-06-30 (×2): 7 [IU] via SUBCUTANEOUS

## 2019-06-30 MED ORDER — METFORMIN HCL ER 500 MG PO TB24
500.0000 mg | ORAL_TABLET | Freq: Every day | ORAL | Status: DC
  Administered 2019-06-30: 500 mg via ORAL
  Filled 2019-06-30: qty 1

## 2019-06-30 MED ORDER — POTASSIUM CHLORIDE CRYS ER 20 MEQ PO TBCR: 20.0000 meq | EXTENDED_RELEASE_TABLET | ORAL | Status: DC

## 2019-06-30 MED ORDER — BECLOMETHASONE DIPROPIONATE 80 MCG/ACT IN AERS: 2.0000 | INHALATION_SPRAY | Freq: Two times a day (BID) | RESPIRATORY_TRACT | Status: DC

## 2019-06-30 MED ORDER — SODIUM CHLORIDE 0.9 % IV SOLN: 250.0000 mL | INTRAVENOUS | Status: DC | PRN

## 2019-06-30 MED ORDER — PROPRANOLOL HCL 80 MG PO TABS
80.0000 mg | ORAL_TABLET | Freq: Two times a day (BID) | ORAL | Status: DC
  Administered 2019-06-30: 10:00:00 80 mg via ORAL
  Filled 2019-06-30 (×2): qty 1

## 2019-06-30 MED ORDER — ACETAMINOPHEN 325 MG PO TABS: 650.0000 mg | ORAL_TABLET | Freq: Four times a day (QID) | ORAL | Status: DC | PRN

## 2019-06-30 MED ORDER — SODIUM CHLORIDE 0.9% FLUSH: 3.0000 mL | INTRAVENOUS | Status: DC | PRN

## 2019-06-30 MED ORDER — CAMPHOR-MENTHOL-METHYL SAL 3.1-6-10 % EX PTCH: 1.0000 | MEDICATED_PATCH | Freq: Every day | CUTANEOUS | Status: DC | PRN

## 2019-06-30 MED ORDER — MUSCLE RUB 10-15 % EX CREA: TOPICAL_CREAM | CUTANEOUS | Status: DC | PRN

## 2019-06-30 MED ORDER — INSULIN GLARGINE 100 UNIT/ML ~~LOC~~ SOLN
50.0000 [IU] | Freq: Every day | SUBCUTANEOUS | Status: DC
  Filled 2019-06-30: qty 0.5

## 2019-06-30 MED ORDER — ENOXAPARIN SODIUM 40 MG/0.4ML ~~LOC~~ SOLN
40.0000 mg | Freq: Every day | SUBCUTANEOUS | Status: DC
  Administered 2019-06-30: 40 mg via SUBCUTANEOUS
  Filled 2019-06-30: qty 0.4

## 2019-06-30 MED ORDER — LEVETIRACETAM 500 MG PO TABS: 500.0000 mg | ORAL_TABLET | Freq: Two times a day (BID) | ORAL | Status: DC

## 2019-06-30 MED ORDER — INSULIN DETEMIR 100 UNIT/ML ~~LOC~~ SOLN
50.0000 [IU] | Freq: Every day | SUBCUTANEOUS | Status: DC
  Administered 2019-06-30: 14:00:00 50 [IU] via SUBCUTANEOUS
  Filled 2019-06-30: qty 0.5

## 2019-06-30 MED ORDER — NAPROXEN 375 MG PO TABS
375.0000 mg | ORAL_TABLET | Freq: Two times a day (BID) | ORAL | Status: DC
  Administered 2019-06-30: 375 mg via ORAL
  Filled 2019-06-30 (×2): qty 1

## 2019-06-30 MED ORDER — CYCLOSPORINE 0.05 % OP EMUL
1.0000 [drp] | Freq: Every day | OPHTHALMIC | Status: DC
  Administered 2019-06-30: 1 [drp] via OPHTHALMIC
  Filled 2019-06-30: qty 1

## 2019-06-30 MED ORDER — PREPLUS 27-1 MG PO TABS: 1.0000 | ORAL_TABLET | Freq: Every day | ORAL | Status: DC

## 2019-06-30 MED ORDER — ACETAMINOPHEN 650 MG RE SUPP: 650.0000 mg | Freq: Four times a day (QID) | RECTAL | Status: DC | PRN

## 2019-06-30 MED ORDER — FLUTICASONE PROPIONATE 50 MCG/ACT NA SUSP
2.0000 | Freq: Every day | NASAL | Status: DC | PRN
  Filled 2019-06-30: qty 16

## 2019-06-30 MED ORDER — LAMOTRIGINE 150 MG PO TABS
150.0000 mg | ORAL_TABLET | Freq: Two times a day (BID) | ORAL | Status: DC
  Administered 2019-06-30: 150 mg via ORAL
  Filled 2019-06-30 (×2): qty 1

## 2019-06-30 MED ORDER — INSULIN DEGLUDEC 200 UNIT/ML ~~LOC~~ SOPN: 60.0000 [IU] | PEN_INJECTOR | Freq: Every day | SUBCUTANEOUS | Status: DC

## 2019-06-30 MED ORDER — BUDESONIDE 0.25 MG/2ML IN SUSP
0.2500 mg | Freq: Two times a day (BID) | RESPIRATORY_TRACT | Status: DC
  Administered 2019-06-30: 0.25 mg via RESPIRATORY_TRACT
  Filled 2019-06-30: qty 2

## 2019-06-30 MED ORDER — PRENATAL MULTIVITAMIN CH
1.0000 | ORAL_TABLET | Freq: Every day | ORAL | Status: DC
  Administered 2019-06-30: 1 via ORAL
  Filled 2019-06-30: qty 1

## 2019-06-30 MED ORDER — BACLOFEN 10 MG PO TABS
10.0000 mg | ORAL_TABLET | Freq: Two times a day (BID) | ORAL | Status: DC | PRN
  Filled 2019-06-30: qty 1

## 2019-06-30 MED ORDER — INSULIN ASPART 100 UNIT/ML ~~LOC~~ SOLN: 0.0000 [IU] | Freq: Every day | SUBCUTANEOUS | Status: DC

## 2019-06-30 MED ORDER — OLOPATADINE HCL 0.1 % OP SOLN
1.0000 [drp] | Freq: Every day | OPHTHALMIC | Status: DC | PRN
  Filled 2019-06-30: qty 5

## 2019-06-30 MED ORDER — LEVETIRACETAM 750 MG PO TABS
750.0000 mg | ORAL_TABLET | Freq: Two times a day (BID) | ORAL | Status: DC
  Filled 2019-06-30: qty 1

## 2019-06-30 NOTE — H&P (Signed)
TRH H&P    Patient Demographics:    [name], is a 49 y.o. female  MRN: [id number]  DOB - 02-29-1971  Admit Date - 06/29/2019  Referring MD/NP/PA:  Martinique Hunter  Outpatient Primary MD for the patient is Sherene Sires, DO  Patient coming from:  home  Chief complaint- slurred speech    HPI:    [name]  is a 49 y.o. female,  w hypertension, Dm2, h/o stroke,seizure do,  Long QT, h/o SVT ablation, s/p ICD sarcoidosis, Jerrye Bushy, esophageal stricture, Anemia,  Copd, apparently presents wth c/o slurred speech around 8pm,  Pt states left side was numb, felt like she was dragging her feet. Slight disoriented per her daughter.  Pt states headache L side of head.  Went away when was given seizure medicine.   Pt denies incontinence , but apparently per neurology had some incontinence.   Pt was evaluated by neurology in ED, and thought that the patients symptoms were not consistent with acute stroke.  CTA unable to be performed due to iv dye allergy, and MRI unable to be performed due to ICD   Pt was given solumedrol 125mg  iv x1, keppra 1000mg  iv x1, and magnesium sulfate 2gm iv x1 and LR 1 L iv x1, and was improving but had some gait instability and ED didn't think that it was safe to send patient home, and requested obs admission.      Review of systems:    In addition to the HPI above,  No Fever-chills, No Headache, No changes with Vision or hearing, No problems swallowing food or Liquids, No Chest pain, Cough or Shortness of Breath, No Abdominal pain, No Nausea or Vomiting, bowel movements are regular, No Blood in stool or Urine, No dysuria, No new skin rashes or bruises, No new joints pains-aches,  No new weakness, tingling, numbness in any extremity, No recent weight gain or loss, No polyuria, polydypsia or polyphagia, No significant Mental Stressors.  All other systems reviewed and are  negative.    Past History of the following :    Past Medical History:  Diagnosis Date  . Abdominal pain, periumbilic   . Allergic rhinitis   . Allergy   . Anemia   . Anxiety   . Arthritis   . Asthma   . Automatic implantable cardioverter-defibrillator in situ 2006, replaced 2008   [employer]  . Bronchitis   . Cancer ([place])    Nodules in lungs pt believes were cancerous pt unsure  . COPD (chronic obstructive pulmonary disease) ([place])   . DM (diabetes mellitus) ([employer]) 1997/1998   type 1, initially gestational but soon after child birth developed IDDM  . Esophageal stricture 2005/2009   esophageal strictures dilated 2005, 2009  . Eye hemorrhage    bilateral  . Fibroid, uterine   . Fibromyalgia   . GERD (gastroesophageal reflux disease) 2005  . History of blood transfusion years ago  . Hypertension   . Internal hemorrhoids 2010   2011 band ligation of int rrhoids.  . Long Q-T syndrome   .  Neuropathy in diabetes ([place])   . Palpitation   . Sarcoidosis   . Seizure disorder ([place])    none recently  . Stroke ([place])    QUESTIONABLE TIA       Past Surgical History:  Procedure Laterality Date  . AUTOMATIC IMPLANTABLE CARDIAC DEFIBRILLATOR SITU  2006,    ICD-Medtronic   Remote - Yes   . CERVIX REMOVAL    . DILITATION & CURRETTAGE/HYSTROSCOPY WITH HYDROTHERMAL ABLATION N/A 06/07/2013   Procedure: DILATATION & CURETTAGE/HYSTEROSCOPY WITH attempted HYDROTHERMAL ABLATION;  Surgeon: Frederico Hamman, MD;  Location: [facility];  Service: Gynecology;  Laterality: N/A;  . EP IMPLANTABLE DEVICE N/A 04/04/2015   Procedure:  ICD Generator Changeout;  Surgeon: Evans Lance, MD;  Location: [facility];  Service: Cardiovascular;  Laterality: N/A;  . ESOPHAGOGASTRODUODENOSCOPY (EGD) WITH PROPOFOL N/A 10/24/2014   Procedure: ESOPHAGOGASTRODUODENOSCOPY (EGD) WITH PROPOFOL;  Surgeon: Inda Castle, MD;  Location: [facility];  Service: Endoscopy;  Laterality: N/A;  . EXPLORATORY LAPAROTOMY   11/06/14   evacuation of post TAH/BSO hematoma  . HAND SURGERY Right    x 2  . HEMORRHOID BANDING  03/2010  . SALPINGECTOMY    . SVT ablation    . TOTAL ABDOMINAL HYSTERECTOMY W/ BILATERAL SALPINGOOPHORECTOMY  11/06/14   at [facility]. for menorrhagia, pelvic pain and enlarging uterine fibroids  . TUBAL LIGATION    . VIDEO BRONCHOSCOPY Bilateral 01/24/2015   Procedure: VIDEO BRONCHOSCOPY WITHOUT FLUORO;  Surgeon: Marshell Garfinkel, MD;  Location: [facility];  Service: Cardiopulmonary;  Laterality: Bilateral;      Social History:      Social History   Tobacco Use  . Smoking status: Never Smoker  . Smokeless tobacco: Never Used  Substance Use Topics  . Alcohol use: Yes    Alcohol/week: 0.0 standard drinks    Comment: None since 2015-was occasional       Family History :     Family History  Problem Relation Age of Onset  . Heart attack Mother   . Lung cancer Maternal Uncle        x2  . Breast cancer Maternal Aunt        x2  . Stomach cancer Paternal Uncle   . Stomach cancer Paternal Grandfather   . Stomach cancer Paternal Grandmother   . Diabetes Father   . Positive PPD/TB Exposure Father   . Glaucoma Father   . Esophageal cancer Paternal Aunt   . Colon cancer Maternal Grandfather        Home Medications:   Prior to Admission medications   Medication Sig Start Date End Date Taking? Authorizing Provider  acetaminophen (TYLENOL) 500 MG tablet Take 1,000 mg by mouth every 6 (six) hours as needed for mild pain or fever. Reported on 07/22/2015   Yes [provider]  baclofen (LIORESAL) 10 MG tablet Take 1 tablet (10 mg total) by mouth 2 (two) times daily. Patient taking differently: Take 10 mg by mouth 2 (two) times daily as needed (back pain).  03/08/18  Yes Bland, Scott, DO  beclomethasone (QVAR) 80 MCG/ACT inhaler Inhale 2 puffs into the lungs 2 (two) times daily. 03/08/18  Yes Bland, Scott, DO  Camphor-Menthol-Methyl Sal (SALONPAS) 3.05-23-08 % PTCH Apply 1 patch  topically daily as needed (pain).   Yes [provider]  cycloSPORINE (RESTASIS) 0.05 % ophthalmic emulsion Place 1 drop daily into both eyes.   Yes [provider]  fluconazole (DIFLUCAN) 200 MG tablet Take 1 tablet (200 mg total)  by mouth every 3 (three) days. 06/21/19  Yes Shelly Bombard, MD  fluticasone Asencion Islam) 50 MCG/ACT nasal spray Place 2 sprays into both nostrils daily as needed for allergies.    Yes [provider]  Insulin Degludec (TRESIBA FLEXTOUCH) 200 UNIT/ML SOPN Inject 60 Units into the skin daily at 6 (six) AM. 05/17/19  Yes Lockamy, Timothy, DO  lamoTRIgine (LAMICTAL) 150 MG tablet Take 1 tablet (150 mg total) by mouth 2 (two) times daily. 02/17/18  Yes Cameron Sprang, MD  levETIRAcetam (KEPPRA) 500 MG tablet Take 1 tablet (500 mg total) by mouth 2 (two) times daily. 02/17/18  Yes Cameron Sprang, MD  metFORMIN (GLUCOPHAGE-XR) 500 MG 24 hr tablet Take 500 mg by mouth daily with breakfast.   Yes [provider]  metroNIDAZOLE (METROGEL) 0.75 % vaginal gel PLACE 1 APPLICATORFUL VAGINALLY TWICE DAILY Patient taking differently: Place 1 Applicatorful vaginally daily as needed (irritation).  04/17/19  Yes Shelly Bombard, MD  naproxen (NAPROSYN) 375 MG tablet Take 1 tablet (375 mg total) by mouth 2 (two) times daily with a meal. 03/08/18  Yes Bland, Scott, DO  NOVOLOG FLEXPEN 100 UNIT/ML FlexPen Inject 12u up to 3 times per day with meals as long as sugar is >150 07/29/18  Yes Bland, Scott, DO  Olopatadine HCl 0.7 % SOLN Place 1 drop into both eyes daily as needed (eye allergies).    Yes [provider]  potassium chloride SA (K-DUR,KLOR-CON) 20 MEQ tablet Take 1 tablet (20 mEq total) by mouth daily. Patient taking differently: Take 20 mEq by mouth every other day.  03/08/18  Yes Sherene Sires, DO  Prenatal Vit-Fe Fumarate-FA (PREPLUS) 27-1 MG TABS Take 1 tablet by mouth daily before breakfast. 05/23/18  Yes Shelly Bombard, MD  propranolol  (INDERAL) 80 MG tablet TAKE 1 TABLET BY MOUTH TWICE A DAY Patient taking differently: Take 80 mg by mouth 2 (two) times daily.  08/17/18  Yes Evans Lance, MD  ACCU-CHEK SOFTCLIX LANCETS lancets Use as instructed 04/05/18   Sherene Sires, DO  glucose blood (ACCU-CHEK GUIDE) test strip Use as instructed 04/05/18   Sherene Sires, DO  Insulin Pen Needle 31G X 8 MM MISC BD UltraFine III Pen Needles. For use with insulin pen device. Inject insulin 6 x daily 04/05/18   Sherene Sires, DO  Lancet Devices [facility]) lancets Use as instructed 10/20/16   Nicolette Bang, DO     Allergies:     Allergies  Allergen Reactions  . Contrast Media [Iodinated Diagnostic Agents] Anaphylaxis and Shortness Of Breath    Needed to be defibrillated and patient was pre-medicated with radiology 13 hour premeds.   . Iohexol Anaphylaxis     Code: RASH, Desc: PT STATES SHE IS ALLERGIC TO IV CONTRAST 05/28/06/RM, Onset Date: [id number]   . Augmentin [Amoxicillin-Pot Clavulanate] Other (See Comments)    "stomach hurt"  . Codeine Nausea And Vomiting    jittery  . Demerol [Meperidine] Other (See Comments)    dysphoria  . Morphine And Related Nausea And Vomiting     Physical Exam:   Vitals  Blood pressure (!) 145/95, pulse 81, resp. rate (!) 21, height 5\' 7"  (1.702 m), weight 83.9 kg, last menstrual period 07/23/2014, SpO2 94 %.  1.  General: axoxo3  2. Psychiatric: euthymic  3. Neurologic: Speech fluent, cn 2-12 intact, reflexes 2+ symmetric, diffuse with no clonus, motor 5/5 in all 4 ext No pronator drift Good finger to nose  4. HEENMT:  Anicteric, pupils 1.57mm symmetric, direct, consensual intact eomi Neck: no jvd  5. Respiratory : CTAB  6. Cardiovascular : rrr s1, s2, no m/g/r,  icd on left upper shoulder  7. Gastrointestinal:  Abd: soft, nt, nd, +bs  8. Skin:  Ext: no c/c/e,  No rash,   9.Musculoskeletal:  Good ROM    Data Review:    CBC Recent Labs  Lab  06/29/19 2052 06/29/19 2101  WBC 8.0  --   HGB 13.5 12.9  HCT 39.5 38.0  PLT 223  --   MCV 88.2  --   MCH 30.1  --   MCHC 34.2  --   RDW 12.0  --   LYMPHSABS 3.2  --   MONOABS 0.6  --   EOSABS 0.1  --   BASOSABS 0.1  --    ------------------------------------------------------------------------------------------------------------------  Results for orders placed or performed during the hospital encounter of 06/29/19 (from the past 48 hour(s))  CBG monitoring, ED     Status: Abnormal   Collection Time: 06/29/19  8:43 PM  Result Value Ref Range   Glucose-Capillary 346 (H) 70 - 99 mg/dL  Protime-INR     Status: None   Collection Time: 06/29/19  8:52 PM  Result Value Ref Range   Prothrombin Time 13.5 11.4 - 15.2 seconds   INR 1.0 0.8 - 1.2    Comment: (NOTE) INR goal varies based on device and disease states. Performed at [facility], [place] [address] [place], [place] [address]   APTT     Status: None   Collection Time: 06/29/19  8:52 PM  Result Value Ref Range   aPTT 30 24 - 36 seconds    Comment: Performed at [facility] [address], [place], [place] [address]  CBC     Status: None   Collection Time: 06/29/19  8:52 PM  Result Value Ref Range   WBC 8.0 4.0 - 10.5 K/uL   RBC 4.48 3.87 - 5.11 MIL/uL   Hemoglobin 13.5 12.0 - 15.0 g/dL   HCT 39.5 36.0 - 46.0 %   MCV 88.2 80.0 - 100.0 fL   MCH 30.1 26.0 - 34.0 pg   MCHC 34.2 30.0 - 36.0 g/dL   RDW 12.0 11.5 - 15.5 %   Platelets 223 150 - 400 K/uL   nRBC 0.0 0.0 - 0.2 %    Comment: Performed at [facility], [place] [address] [place], [place] [address]  Differential     Status: None   Collection Time: 06/29/19  8:52 PM  Result Value Ref Range   Neutrophils Relative % 50 %   Neutro Abs 4.0 1.7 - 7.7 K/uL   Lymphocytes Relative 40 %   Lymphs Abs 3.2 0.7 - 4.0 K/uL   Monocytes Relative 8 %   Monocytes Absolute 0.6 0.1 - 1.0 K/uL   Eosinophils Relative 1 %   Eosinophils Absolute 0.1 0.0 - 0.5 K/uL    Basophils Relative 1 %   Basophils Absolute 0.1 0.0 - 0.1 K/uL   Immature Granulocytes 0 %   Abs Immature Granulocytes 0.03 0.00 - 0.07 K/uL    Comment: Performed at [facility] [address] [place], [place] [address]  Comprehensive metabolic panel     Status: Abnormal   Collection Time: 06/29/19  8:52 PM  Result Value Ref Range   Sodium 136 135 - 145 mmol/L   Potassium 3.6 3.5 - 5.1 mmol/L   Chloride 102 98 -  111 mmol/L   CO2 22 22 - 32 mmol/L   Glucose, Bld 342 (H) 70 - 99 mg/dL   BUN 5 (L) 6 - 20 mg/dL   Creatinine, Ser 0.71 0.44 - 1.00 mg/dL   Calcium 9.3 8.9 - 10.3 mg/dL   Total Protein 7.3 6.5 - 8.1 g/dL   Albumin 3.6 3.5 - 5.0 g/dL   AST 24 15 - 41 U/L   ALT 18 0 - 44 U/L   Alkaline Phosphatase 82 38 - 126 U/L   Total Bilirubin 0.9 0.3 - 1.2 mg/dL   GFR calc non Af Amer >60 >60 mL/min   GFR calc Af Amer >60 >60 mL/min   Anion gap 12 5 - 15    Comment: Performed at [facility], [place] [address] [place], [place] [address]  I-Stat beta hCG blood, ED     Status: None   Collection Time: 06/29/19  8:59 PM  Result Value Ref Range   I-stat hCG, quantitative <5.0 <5 mIU/mL   Comment 3            Comment:   GEST. AGE      CONC.  (mIU/mL)   <=1 WEEK        5 - 50     2 WEEKS       50 - 500     3 WEEKS       100 - 10,000     4 WEEKS     1,000 - 30,000        FEMALE AND NON-PREGNANT FEMALE:     LESS THAN 5 mIU/mL   I-stat chem 8, ED     Status: Abnormal   Collection Time: 06/29/19  9:01 PM  Result Value Ref Range   Sodium 136 135 - 145 mmol/L   Potassium 3.5 3.5 - 5.1 mmol/L   Chloride 101 98 - 111 mmol/L   BUN 4 (L) 6 - 20 mg/dL   Creatinine, Ser 0.40 (L) 0.44 - 1.00 mg/dL   Glucose, Bld 330 (H) 70 - 99 mg/dL   Calcium, Ion 1.10 (L) 1.15 - 1.40 mmol/L   TCO2 24 22 - 32 mmol/L   Hemoglobin 12.9 12.0 - 15.0 g/dL   HCT 38.0 36.0 - 46.0 %    Chemistries  Recent Labs  Lab 06/29/19 2052 06/29/19 2101  NA 136 136  K 3.6 3.5  CL 102 101  CO2 22  --    GLUCOSE 342* 330*  BUN 5* 4*  CREATININE 0.71 0.40*  CALCIUM 9.3  --   AST 24  --   ALT 18  --   ALKPHOS 82  --   BILITOT 0.9  --    ------------------------------------------------------------------------------------------------------------------  ------------------------------------------------------------------------------------------------------------------ GFR: Estimated Creatinine Clearance: 96.8 mL/min (A) (by C-G formula based on SCr of 0.4 mg/dL (L)). Liver Function Tests: Recent Labs  Lab 06/29/19 2052  AST 24  ALT 18  ALKPHOS 82  BILITOT 0.9  PROT 7.3  ALBUMIN 3.6   No results for input(s): LIPASE, AMYLASE in the last 168 hours. No results for input(s): AMMONIA in the last 168 hours. Coagulation Profile: Recent Labs  Lab 06/29/19 2052  INR 1.0   Cardiac Enzymes: No results for input(s): CKTOTAL, CKMB, CKMBINDEX, TROPONINI in the last 168 hours. BNP (last 3 results) No results for input(s): PROBNP in the last 8760 hours. HbA1C: No results for input(s): HGBA1C in the last 72 hours. CBG: Recent Labs  Lab 06/29/19 2043  GLUCAP 346*  Lipid Profile: No results for input(s): CHOL, HDL, LDLCALC, TRIG, CHOLHDL, LDLDIRECT in the last 72 hours. Thyroid Function Tests: No results for input(s): TSH, T4TOTAL, FREET4, T3FREE, THYROIDAB in the last 72 hours. Anemia Panel: No results for input(s): VITAMINB12, FOLATE, FERRITIN, TIBC, IRON, RETICCTPCT in the last 72 hours.  --------------------------------------------------------------------------------------------------------------- Urine analysis:    Component Value Date/Time   COLORURINE YELLOW 01/19/2015 2202   APPEARANCEUR CLEAR 01/19/2015 2202   LABSPEC 1.006 01/19/2015 2202   PHURINE 6.5 01/19/2015 2202   GLUCOSEU NEGATIVE 01/19/2015 2202   HGBUR NEGATIVE 01/19/2015 2202   BILIRUBINUR Neg 03/03/2017 1101   KETONESUR NEGATIVE 01/19/2015 2202   PROTEINUR Trace 03/03/2017 1101   PROTEINUR NEGATIVE  01/19/2015 2202   UROBILINOGEN 0.2 03/03/2017 1101   UROBILINOGEN 1.0 01/19/2015 2202   NITRITE Neg 03/03/2017 1101   NITRITE NEGATIVE 01/19/2015 2202   LEUKOCYTESUR Trace (A) 03/03/2017 1101      Imaging Results:    CT HEAD CODE STROKE WO CONTRAST  Result Date: 06/29/2019 CLINICAL DATA:  Code stroke.  Aphasia EXAM: CT HEAD WITHOUT CONTRAST TECHNIQUE: Contiguous axial images were obtained from the base of the skull through the vertex without intravenous contrast. COMPARISON:  11/11/2016 head CT FINDINGS: Brain: There is no mass, hemorrhage or extra-axial collection. The size and configuration of the ventricles and extra-axial CSF spaces are normal. The brain parenchyma is normal, without evidence of acute or chronic infarction. Vascular: No abnormal hyperdensity of the major intracranial arteries or dural venous sinuses. No intracranial atherosclerosis. Skull: The visualized skull base, calvarium and extracranial soft tissues are normal. Sinuses/Orbits: No fluid levels or advanced mucosal thickening of the visualized paranasal sinuses. No mastoid or middle ear effusion. The orbits are normal. ASPECTS [facility] Stroke Program Early CT Score) - Ganglionic level infarction (caudate, lentiform nuclei, internal capsule, insula, M1-M3 cortex): 7 - Supraganglionic infarction (M4-M6 cortex): 3 Total score (0-10 with 10 being normal): 10 IMPRESSION: 1. No acute abnormality 2. ASPECTS is 10 3. These results were communicated to Dr. Amie Portland at 9:12 pm on 06/29/2019 by text page via the [facility] messaging system. Electronically Signed   By: Ulyses Jarred M.D.   On: 06/29/2019 21:13    ekg nsr at 90, RAD, prolonged QD, no st-t changes c/w ischemia   Assessment & Plan:    Active Problems:   Ataxia  Gait instability PT to evaluate and tx  Slurred speech/headache, Decreased responsiveness, ? Incontinence  ddx Complex Migraine, Seizure Pt doing much better.  Please d/w neurology in Am regarding , pt  noting some left sided weakness w her above symptoms  Seizure do Cont keppra 500mg  po bid Cont Lamictal 150mg  po bid  Migraines Cont propranolol Pt told that can't just stop naproxen due to risk of rebound headache  Arthritis/ FM Cont Naproxen  Dm2 Cont Metformin Cont Tresiba fsbs ac and qhs, ISS  Copd Cont Qvar     DVT Prophylaxis-   Lovenox - SCDs  AM Labs Ordered, also please review Full Orders  Family Communication: Admission, patients condition and plan of care including tests being ordered have been discussed with the patient who indicate understanding and agree with the plan and Code Status.  Code Status:  FULL CODE per patient, daughter present with patient in ED  Admission status: Observation/: Based on patients clinical presentation and evaluation of above clinical data, I have made determination that patient meets observation criteria at this time.   Time spent in minutes : 55 minutes   Andres Ege.D  on 06/30/2019 at 1:27 AM

## 2019-06-30 NOTE — Telephone Encounter (Signed)
Mother of the patient called. The patient ended up going to the hospital last night because her whole left side was twisted and she couldn't talk. She wasn't able to have a CT with contrast because she is allergic to the dye, but she did have a scan done to determine if it was a stroke. The mother wanted to know if Dr, Lovena Le could check in on the patient in the hospital if possible.   The mother also states that the patient has been without a home remote device. The patient has reached out to [employer], but no replacement has been sent yet.

## 2019-06-30 NOTE — Evaluation (Signed)
Physical Therapy Evaluation Patient Details Name: [name] MRN: [id number] DOB: 01/15/1971 Today's Date: 06/30/2019   History of Present Illness  Pt adm with difficulty talking and decr responsiveness. Unable to have CT due to allergy of contrast and unable to have MRI due to ICD. Neurology saw and most likely complex migraine vs psychogenic weakness. PMH - sarcoid, lupus, fibromyalgia, seizures, long QT, ICD, DM.  Clinical Impression  Pt presents to PT with slightly unsteady gait. Use of rollator allowed pt to amb adequately in hallway and room. From PT standpoint pt okay to return home. Recommend HHPT to work toward pt returning to independence and rollator for home use.     Follow Up Recommendations Home health PT;Supervision - Intermittent    Equipment Recommendations  Other (comment)(rollator)    Recommendations for Other Services       Precautions / Restrictions Precautions Precautions: None      Mobility  Bed Mobility Overal bed mobility: Independent                Transfers Overall transfer level: Modified independent Equipment used: None;4-wheeled walker             General transfer comment: Slight incr time to rise but no loss of balance  Ambulation/Gait Ambulation/Gait assistance: Supervision;Modified independent (Device/Increase time) Gait Distance (Feet): 150 Feet Assistive device: 4-wheeled walker Gait Pattern/deviations: Step-through pattern;Decreased stride length;Shuffle Gait velocity: decr Gait velocity interpretation: 1.31 - 2.62 ft/sec, indicative of limited community ambulator General Gait Details: slow gait with decr foot clearance bilaterally, no loss of balance  Stairs            Wheelchair Mobility    Modified Rankin (Stroke Patients Only)       Balance Overall balance assessment: Needs assistance Sitting-balance support: No upper extremity supported;Feet supported Sitting balance-Leahy Scale: Normal      Standing balance support: No upper extremity supported;During functional activity Standing balance-Leahy Scale: Good                               Pertinent Vitals/Pain Pain Assessment: No/denies pain    Home Living Family/patient expects to be discharged to:: Private residence Living Arrangements: Alone Available Help at Discharge: Family;Available PRN/intermittently Type of Home: House Home Access: Stairs to enter Entrance Stairs-Rails: Right Entrance Stairs-Number of Steps: 2 Home Layout: One level Home Equipment: Cane - single point      Prior Function Level of Independence: Independent         Comments: Has a cane but hasn't needed until last few days.      Hand Dominance        Extremity/Trunk Assessment   Upper Extremity Assessment Upper Extremity Assessment: Overall WFL for tasks assessed    Lower Extremity Assessment Lower Extremity Assessment: Generalized weakness       Communication   Communication: No difficulties  Cognition Arousal/Alertness: Awake/alert Behavior During Therapy: WFL for tasks assessed/performed Overall Cognitive Status: Within Functional Limits for tasks assessed                                        General Comments      Exercises     Assessment/Plan    PT Assessment All further PT needs can be met in the next venue of care  PT Problem List Decreased strength;Decreased balance;Decreased mobility         PT Treatment Interventions      PT Goals (Current goals can be found in the Care Plan section)  Acute Rehab PT Goals PT Goal Formulation: All assessment and education complete, DC therapy    Frequency     Barriers to discharge        Co-evaluation               AM-PAC PT "6 Clicks" Mobility  Outcome Measure Help needed turning from your back to your side while in a flat bed without using bedrails?: None Help needed moving from lying on your back to sitting on the side of  a flat bed without using bedrails?: None Help needed moving to and from a bed to a chair (including a wheelchair)?: None Help needed standing up from a chair using your arms (e.g., wheelchair or bedside chair)?: None Help needed to walk in hospital room?: None Help needed climbing 3-5 steps with a railing? : A Little 6 Click Score: 23    End of Session Equipment Utilized During Treatment: Gait belt Activity Tolerance: Patient tolerated treatment well Patient left: in bed;with call bell/phone within reach;with family/visitor present Nurse Communication: Mobility status PT Visit Diagnosis: Unsteadiness on feet (R26.81);Muscle weakness (generalized) (M62.81)    Time: 6812-7517 PT Time Calculation (min) (ACUTE ONLY): 14 min   Charges:   PT Evaluation $PT Eval Low Complexity: [place] Pager [phone] Office [place] 06/30/2019, 11:10 AM

## 2019-06-30 NOTE — ED Notes (Signed)
Pt ambulated in hallway with assistance.

## 2019-06-30 NOTE — Progress Notes (Signed)
Reason for consult: Weakness, Headache  Subjective: Patient is complaining of mild headache.  Still complains of generalized weakness states that she had trouble walking with PT/ OT.    ROS: negative except above   Examination  Vital signs in last 24 hours: Temp:  [97.8 F (36.6 C)-98.1 F (36.7 C)] 97.8 F (36.6 C) (02/12 1218) Pulse Rate:  [75-88] 77 (02/12 1218) Resp:  [13-21] 16 (02/12 1218) BP: (108-180)/(70-113) 118/73 (02/12 1218) SpO2:  [91 %-98 %] 96 % (02/12 1218) Weight:  [83.6 kg-83.9 kg] 83.6 kg (02/12 0810)  General: lying in bed CVS: pulse-normal rate and rhythm RS: breathing comfortably Extremities: normal   Neuro: MS: Alert, oriented, follows commands CN: pupils equal and reactive,  EOMI, face symmetric, tongue midline, normal sensation over face, Motor: 5/5 strength in all 4 extremities Reflexes: 2+ bilaterally over patella, biceps, plantars: flexor Coordination: normal Gait: not tested  Basic Metabolic Panel: Recent Labs  Lab 06/29/19 09-04-2050 06/29/19 09/03/2099 06/30/19 0404  NA 136 136 136  K 3.6 3.5 4.2  CL 102 101 102  CO2 22  --  19*  GLUCOSE 342* 330* 328*  BUN 5* 4* 5*  CREATININE 0.71 0.40* 0.68  CALCIUM 9.3  --  9.3    CBC: Recent Labs  Lab 06/29/19 September 04, 2050 06/29/19 September 03, 2099 06/30/19 0404  WBC 8.0  --  9.2  NEUTROABS 4.0  --   --   HGB 13.5 12.9 14.6  HCT 39.5 38.0 43.8  MCV 88.2  --  89.6  PLT 223  --  254     Coagulation Studies: Recent Labs    06/29/19 09/04/50  LABPROT 13.5  INR 1.0    Imaging Reviewed: CT head: no acute findings   ASSESSMENT AND PLAN  49 year old female with history of juvenile myoclonic epilepsy, history of questionable TIA, long QT syndrome and V. tach status post pacemaker placement, migraines, diabetes brought to the emergency department by her daughter after she suddenly felt weak all over and had difficulty with speaking.  Daughter also states subjective weakness on her left side and her left face was  "drawn up and twisted".She then also developed severe headache following this.  Per neurology note, there was mention of report of incontinence but patient  And daughter clarifies she had no incontinence.    Code stroke was called and assessed by my colleague Dr. Rory Percy, patient at that time had only generalized weakness, did not participate much on exam and had a hypophonic voice. Felt presentation was either complex migraine versus psychogenic versus seizure and patient was given loading dose of Keppra and admitted for PT eval.  On my assessment today patient did not have any focal weakness, lower extremity weakness however this was appeared to be from poor effort and able to raise leg> 10 seconds without drift after encouragement.     Recommendations -Because it is unclear if this was a seizure, would not increase home antiepileptic agents.   -No driving for 6 months and seizure precautions   Recommend outpatient neurology follow-up with her epilepsy doctor Dr. Delice Lesch 1-2 weeks    Zaryia Markel [facility] Pager Number [phone] For questions after 7pm please refer to AMION to reach the Neurologist on call

## 2019-06-30 NOTE — Discharge Summary (Signed)
Is a patient at [facility]!!    Physician Discharge Summary  [name] [id number] DOB: Sep 16, 1970 DOA: 06/29/2019  PCP: Sherene Sires, DO  Admit date: 06/29/2019 Discharge date: 06/30/2019  Admitted From: home Discharge disposition: home   Recommendations for Outpatient Follow-Up:   1. Follow-up with primary care provider 1 to 2 weeks for evaluation of diabetes control 2. Outpatient physical therapy arranged 3. No driving for 6 months  Discharge Diagnosis:   Principal Problem:   Gait instability Active Problems:   Migraine without aura and without status migrainosus, not intractable   Ataxia   Diabetes mellitus, type 2 (HCC)   Essential hypertension   Absence seizure disorder (HCC)   Chronic fatigue    Discharge Condition: Improved.  Diet recommendation: Low sodium, heart healthy.  Carbohydrate-modified.  .  Wound care: None.  Code status: Full.   History of Present Illness:   [name]  is a 49 y.o. female,  w hypertension, Dm2, h/o stroke,seizure do,  Long QT, h/o SVT ablation, s/p ICD sarcoidosis, Jerrye Bushy, esophageal stricture, Anemia,  Copd, presented 06/30/19 wth c/o slurred speech around 8pm,  Pt stated left side was numb, felt like she was dragging her feet. Slight disoriented per her daughter.  Pt stated headache L side of head.  Pt denied incontinence , but apparently per neurology had some incontinence.   Pt was evaluated by neurology in ED, and thought that the patients symptoms were not consistent with acute stroke.  CTA unable to be performed due to iv dye allergy, and MRI unable to be performed due to ICD   Pt was given solumedrol 125mg  iv x1, keppra 1000mg  iv x1, and magnesium sulfate 2gm iv x1 and LR 1 L iv x1, and was improving but had some gait instability and ED didn't think that it was safe to send patient home, and requested obs admission.     Hospital Course by Problem:    Gait instability. Hx of same and uses a cane at home.  Evaluated by physical therapy who recommended outpatient PT. PT to evaluate and tx  Slurred speech/headache, Decreased responsiveness.  Evaluated by neurology who opined ddx Complex Migraine, Seizure versus psychogenic.  Not likely seizure given H&P.  Did recommend to continue her home antiepileptic medications.  At discharge patient is back at baseline.  Of note upon presentation code stroke was called she was assessed by neurology who felt presentation was either complex migraine versus psychogenic versus seizure and she was given a loading dose of Keppra and admitted for physical therapy evaluation.  She was evaluated again on day of discharge neurology who noted patient had no focal weaknesses lower extremity weakness appeared to be from poor effort and able to raise leg greater than 10 seconds without drift after encouragement.  Discharge neurology recommends 10 doing her home meds no driving for 6 months and follow-up with neurology 1 to 2 weeks  Seizure do.Cont keppra 500mg  po bid Cont Lamictal 150mg  po bid see above  Migraines resolved at discharge Cont propranolol  Arthritis/ FM Cont Naproxen  Dm2 uncontrolled.  Hemoglobin A1c 9.1.  Occasions include Metformin and Tresiba.  We will continue this regimen at discharge.  Patient counseled/educated regarding importance of improved diabetes control.  Follow-up with PCP 1 to 2 weeks  Copd Cont Qvar     Medical Consultants:   [facility]   Discharge Exam:   Vitals:   06/30/19 0844 06/30/19 1218  BP:  118/73  Pulse: 79 77  Resp: 16 16  Temp:  97.8 F (36.6 C)  SpO2: 98% 96%   Vitals:   06/30/19 0700 06/30/19 0810 06/30/19 0844 06/30/19 1218  BP: 108/70 138/90  118/73  Pulse: 75 80 79 77  Resp: 17 16 16 16   Temp:  98.1 F (36.7 C)  97.8 F (36.6 C)  TempSrc:  Oral  Oral  SpO2: 97% 95% 98% 96%  Weight:  83.6 kg    Height:  5\' 7"  (1.702 m)       General exam: Appears calm and comfortable.  Respiratory system: Clear to auscultation. Respiratory effort normal. Cardiovascular system: S1 & S2 heard, RRR. No JVD,  rubs, gallops or clicks. No murmurs. Gastrointestinal system: Abdomen is nondistended, soft and nontender. No organomegaly or masses felt. Normal bowel sounds heard. Central nervous system: Alert and oriented. No focal neurological deficits. Extremities: No clubbing,  or cyanosis. No edema. Skin: No rashes, lesions or ulcers. Psychiatry: Judgement and insight appear normal. Mood & affect appropriate.    The results of significant diagnostics from this hospitalization (including imaging, microbiology, ancillary and laboratory) are listed below for reference.     Procedures and Diagnostic Studies:   CT HEAD CODE STROKE WO CONTRAST  Result Date: 06/29/2019 CLINICAL DATA:  Code stroke.  Aphasia EXAM: CT HEAD WITHOUT CONTRAST TECHNIQUE: Contiguous axial images were obtained from the base of the skull through the vertex without intravenous contrast. COMPARISON:  11/11/2016 head CT FINDINGS: Brain: There is no mass, hemorrhage or extra-axial collection. The size and configuration of the ventricles and extra-axial CSF spaces are normal. The brain parenchyma is normal, without evidence of acute or chronic infarction. Vascular: No abnormal hyperdensity of the major intracranial arteries or dural venous sinuses. No intracranial atherosclerosis. Skull: The visualized skull base, calvarium and extracranial soft tissues are normal. Sinuses/Orbits: No fluid levels or advanced mucosal thickening of the visualized paranasal sinuses. No mastoid or middle ear effusion. The orbits are normal. ASPECTS [facility] Stroke Program Early CT Score) - Ganglionic level infarction (caudate, lentiform nuclei, internal capsule, insula, M1-M3 cortex): 7 - Supraganglionic infarction (M4-M6 cortex): 3 Total score (0-10 with 10 being normal): 10 IMPRESSION: 1. No  acute abnormality 2. ASPECTS is 10 3. These results were communicated to Dr. Amie Portland at 9:12 pm on 06/29/2019 by text page via the [facility] messaging system. Electronically Signed   By: Ulyses Jarred M.D.   On: 06/29/2019 21:13     Labs:   Basic Metabolic Panel: Recent Labs  Lab 06/29/19 2052 06/29/19 2052 06/29/19 2101 06/30/19 0404  NA 136  --  136 136  K 3.6   < > 3.5 4.2  CL 102  --  101 102  CO2 22  --   --  19*  GLUCOSE 342*  --  330* 328*  BUN 5*  --  4* 5*  CREATININE 0.71  --  0.40* 0.68  CALCIUM 9.3  --   --  9.3   < > = values in this interval not displayed.   GFR Estimated Creatinine Clearance: 96.6 mL/min (by C-G formula based on SCr of 0.68 mg/dL). Liver Function Tests: Recent Labs  Lab 06/29/19 2052 06/30/19 0404  AST 24 30  ALT 18 23  ALKPHOS 82 95  BILITOT 0.9 0.9  PROT 7.3 7.6  ALBUMIN 3.6 3.6   No results for input(s): LIPASE, AMYLASE in the last 168 hours. No results for input(s): AMMONIA in the last 168 hours. Coagulation profile Recent Labs  Lab 06/29/19 2052  INR 1.0    CBC: Recent Labs  Lab 06/29/19 2052 06/29/19 2101 06/30/19 0404  WBC 8.0  --  9.2  NEUTROABS 4.0  --   --   HGB 13.5 12.9 14.6  HCT 39.5 38.0 43.8  MCV 88.2  --  89.6  PLT 223  --  254   Cardiac Enzymes: No results for input(s): CKTOTAL, CKMB, CKMBINDEX, TROPONINI in the last 168 hours. BNP: Invalid input(s): POCBNP CBG: Recent Labs  Lab 06/29/19 2043 06/30/19 0818 06/30/19 1111  GLUCAP 346* 318* 314*   D-Dimer No results for input(s): DDIMER in the last 72 hours. Hgb A1c Recent Labs    06/30/19 0404  HGBA1C 9.1*   Lipid Profile No results for input(s): CHOL, HDL, LDLCALC, TRIG, CHOLHDL, LDLDIRECT in the last 72 hours. Thyroid function studies No results for input(s): TSH, T4TOTAL, T3FREE, THYROIDAB in the last 72 hours.  Invalid input(s): FREET3 Anemia work up No results for input(s): VITAMINB12, FOLATE, FERRITIN, TIBC, IRON, RETICCTPCT in  the last 72 hours. Microbiology Recent Results (from the past 240 hour(s))  SARS CORONAVIRUS 2 (TAT 6-24 HRS) Nasopharyngeal Nasopharyngeal Swab     Status: None   Collection Time: 06/30/19  1:45 AM   Specimen: Nasopharyngeal Swab  Result Value Ref Range Status   SARS Coronavirus 2 NEGATIVE NEGATIVE Final    Comment: (NOTE) SARS-CoV-2 target nucleic acids are NOT DETECTED. The SARS-CoV-2 RNA is generally detectable in upper and lower respiratory specimens during the acute phase of infection. Negative results do not preclude SARS-CoV-2 infection, do not rule out co-infections with other pathogens, and should not be used as the sole basis for treatment or other patient management decisions. Negative results must be combined with clinical observations, patient history, and epidemiological information. The expected result is Negative. Fact Sheet for Patients: SugarRoll.be Fact Sheet for Healthcare Providers: https://www.woods-mathews.com/ This test is not yet approved or cleared by the [place] FDA and  has been authorized for detection and/or diagnosis of SARS-CoV-2 by FDA under an Emergency Use Authorization (EUA). This EUA will remain  in effect (meaning this test can be used) for the duration of the COVID-19 declaration under Section 56 4(b)(1) of the Act, 21 U.S.C. section 360bbb-3(b)(1), unless the authorization is terminated or revoked sooner. Performed at [facility], [place] [address] [place], [place] [address]      Discharge Instructions:   Discharge Instructions    Ambulatory referral to Physical Therapy   Complete by: As directed    Iontophoresis - 4 mg/ml of dexamethasone: No   T.E.N.S. Unit Evaluation and Dispense as Indicated: No   Call MD for:  persistant dizziness or light-headedness   Complete by: As directed    Call MD for:  severe uncontrolled pain   Complete by: As directed    Call MD for:  temperature  >100.4   Complete by: As directed    Diet - low sodium heart healthy   Complete by: As directed    Discharge instructions   Complete by: As directed    Take medications as prescribed Follow up with PCP 1-2 weeks for evaluation of diabetes control   Increase activity slowly   Complete by: As directed      Allergies as of 06/30/2019      Reactions   Contrast Media [iodinated Diagnostic Agents] Anaphylaxis, Shortness Of Breath   Needed to be defibrillated and patient was pre-medicated with radiology 13 hour premeds.    Iohexol Anaphylaxis    Code: RASH,  Desc: PT STATES SHE IS ALLERGIC TO IV CONTRAST 05/28/06/RM, Onset Date: [id number]   Augmentin [amoxicillin-pot Clavulanate] Other (See Comments)   "stomach hurt"   Codeine Nausea And Vomiting   jittery   Demerol [meperidine] Other (See Comments)   dysphoria   Morphine And Related Nausea And Vomiting   Lantus [insulin Glargine]    Patient reports itching      Medication List    TAKE these medications   accu-chek softclix lancets Use as instructed   Accu-Chek Softclix Lancets lancets Use as instructed   acetaminophen 500 MG tablet Commonly known as: TYLENOL Take 1,000 mg by mouth every 6 (six) hours as needed for mild pain or fever. Reported on 07/22/2015   baclofen 10 MG tablet Commonly known as: LIORESAL Take 1 tablet (10 mg total) by mouth 2 (two) times daily. What changed:   when to take this  reasons to take this   beclomethasone 80 MCG/ACT inhaler Commonly known as: QVAR Inhale 2 puffs into the lungs 2 (two) times daily.   cycloSPORINE 0.05 % ophthalmic emulsion Commonly known as: RESTASIS Place 1 drop daily into both eyes.   fluconazole 200 MG tablet Commonly known as: Diflucan Take 1 tablet (200 mg total) by mouth every 3 (three) days.   fluticasone 50 MCG/ACT nasal spray Commonly known as: FLONASE Place 2 sprays into both nostrils daily as needed for allergies.   glucose blood test strip Commonly  known as: Accu-Chek Guide Use as instructed   Insulin Pen Needle 31G X 8 MM Misc BD UltraFine III Pen Needles. For use with insulin pen device. Inject insulin 6 x daily   lamoTRIgine 150 MG tablet Commonly known as: LAMICTAL Take 1 tablet (150 mg total) by mouth 2 (two) times daily.   levETIRAcetam 500 MG tablet Commonly known as: KEPPRA Take 1 tablet (500 mg total) by mouth 2 (two) times daily.   metFORMIN 500 MG 24 hr tablet Commonly known as: GLUCOPHAGE-XR Take 500 mg by mouth daily with breakfast.   metroNIDAZOLE 0.75 % vaginal gel Commonly known as: METROGEL PLACE 1 APPLICATORFUL VAGINALLY TWICE DAILY What changed: See the new instructions.   naproxen 375 MG tablet Commonly known as: Naprosyn Take 1 tablet (375 mg total) by mouth 2 (two) times daily with a meal.   NovoLOG FlexPen 100 UNIT/ML FlexPen Generic drug: insulin aspart Inject 12u up to 3 times per day with meals as long as sugar is >150   Olopatadine HCl 0.7 % Soln Place 1 drop into both eyes daily as needed (eye allergies).   potassium chloride SA 20 MEQ tablet Commonly known as: KLOR-CON Take 1 tablet (20 mEq total) by mouth daily. What changed: when to take this   PrePLUS 27-1 MG Tabs Take 1 tablet by mouth daily before breakfast.   propranolol 80 MG tablet Commonly known as: INDERAL TAKE 1 TABLET BY MOUTH TWICE A DAY   Salonpas 3.05-23-08 % Ptch Generic drug: Camphor-Menthol-Methyl Sal Apply 1 patch topically daily as needed (pain).   Tyler Aas FlexTouch 200 UNIT/ML Sopn Generic drug: Insulin Degludec Inject 60 Units into the skin daily at 6 (six) AM.            Durable Medical Equipment  (From admission, onward)         Start     Ordered   06/30/19 1407  For home use only DME 4 wheeled rolling walker with seat  Once    Question:  Patient needs a walker to treat with the following  condition  Answer:  Weakness   06/30/19 1406         Follow-up Information    E. Lopez. Schedule an appointment as soon as possible for a visit.   Specialty: Rehabilitation Contact information: [address] [place] [id number] [place] [id number] [phone]           Time coordinating discharge: 45 minutes  Signed:  Radene Gunning NP  [facility] 06/30/2019, 2:31 PM

## 2019-06-30 NOTE — TOC Initial Note (Signed)
Transition of Care [facility]) - Initial/Assessment Note    Patient Details  Name: [name] MRN: [id number] Date of Birth: 02-06-1971  [facility] CM/SW Contact:    Marilu Favre, RN Phone Number: 06/30/2019, 2:10 PM  Clinical Narrative:                 Spoke to patient and daughter at bedside.  PT recommending HHPT . NCM called very home health agency on Medicare.gov list for patient's address and unable to find accepting agency. Patient and daughter voiced understanding. Discussed OP PT , patient and daughter agreeable. Order entered for Neuro Rehab   Expected Discharge Plan: Home/Self Care Barriers to Discharge: Continued Medical Work up   Patient Goals and CMS Choice Patient states their goals for this hospitalization and ongoing recovery are:: to go home CMS Medicare.gov Compare Post Acute Care list provided to:: Patient Choice offered to / list presented to : Patient  Expected Discharge Plan and Services Expected Discharge Plan: Home/Self Care   Discharge Planning Services: CM Consult   Living arrangements for the past 2 months: Single Family Home                 DME Arranged: Walker rolling with seat DME Agency: [employer] Date DME Agency Contacted: 06/30/19 Time DME Agency Contacted: [id number] Representative spoke with at DME Agency: [place] Arrangements/Services Living arrangements for the past 2 months: [place]   Patient language and need for interpreter reviewed:: Yes Do you feel safe going back to the place where you live?: Yes      Need for Family Participation in Patient Care: Yes (Comment) Care giver support system in place?: Yes (comment)   Criminal Activity/Legal Involvement Pertinent to Current Situation/Hospitalization: No - Comment as needed  Activities of Daily Living Home Assistive Devices/Equipment: CBG Meter ADL Screening (condition at time of admission) Patient's cognitive ability adequate  to safely complete daily activities?: Yes Is the patient deaf or have difficulty hearing?: No Does the patient have difficulty seeing, even when wearing glasses/contacts?: No Does the patient have difficulty concentrating, remembering, or making decisions?: No Patient able to express need for assistance with ADLs?: Yes Does the patient have difficulty dressing or bathing?: No Independently performs ADLs?: Yes (appropriate for developmental age) Does the patient have difficulty walking or climbing stairs?: Yes Weakness of Legs: Both Weakness of Arms/Hands: Both  Permission Sought/Granted   Permission granted to share information with : No              Emotional Assessment Appearance:: Appears stated age Attitude/Demeanor/Rapport: Engaged Affect (typically observed): Accepting Orientation: : Oriented to Place, Oriented to Self, Oriented to  Time, Oriented to Situation Alcohol / Substance Use: Not Applicable Psych Involvement: No (comment)  Admission diagnosis:  Ataxia [R27.0] Patient Active Problem List   Diagnosis Date Noted  . Ataxia 06/30/2019  . Nodule of skin of left upper extremity 01/18/2019  . Pain of left hip joint 07/31/2018  . Migraine without aura and without status migrainosus, not intractable 02/16/2017  . Blurry vision, bilateral 12/30/2016  . Chronic daily headache 11/09/2016  . Neck pain 07/20/2016  . Lower extremity weakness 06/17/2016  . Juvenile myoclonic epilepsy ([place]) 03/23/2016  . Occipital neuralgia of left side 03/05/2016  . Other headache syndrome 03/05/2016  . Gait instability 02/12/2015  . 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 Chronic fatigue 01/01/2015  .  Nausea without vomiting 12/11/2014  . Dyspnea 12/10/2014  . Weight loss   . Long QT interval 11/06/2014  . PSVT 02/24/2010  . PRECORDIAL PAIN 08/22/2009  . Palpitations 10/05/2008  . Automatic implantable cardioverter-defibrillator in situ 10/05/2008  .  Absence seizure disorder ([place]) 09/26/2008  . Constipation 02/08/2008  . Sarcoidosis, stage 3 ([place]) 03/15/2007  . Diabetes mellitus, type 2 ([place]) 03/15/2007  . Essential hypertension 03/15/2007  . SYNDROME, LONG QT 03/15/2007  . ALLERGIC RHINITIS 03/15/2007  . OBSTRUCTIVE CHRONIC BRONCHITIS 03/15/2007  . ESOPHAGITIS 02/05/2004  . ESOPHAGEAL STRICTURE 02/05/2004   PCP:  Sherene Sires, DO Pharmacy:   [facility] [place], [place] [address] Phone: [phone] Fax: [phone]  [facility] [phone] - [place], [place] - [address] [place] [address] Phone: [phone] Fax: [phone]     Social Determinants of Health (SDOH) Interventions    Readmission Risk Interventions No flowsheet data found.

## 2019-06-30 NOTE — Telephone Encounter (Signed)
Advised Dr. Lovena Le of incoming message.  Forwarded message to device clinic to follow up on remote monitoring.

## 2019-06-30 NOTE — Progress Notes (Signed)
[name] to be D/C'd Home per MD order.  Discussed with the patient and all questions fully answered.   VSS, Skin clean, dry and intact without evidence of skin break down, no evidence of skin tears noted. IV catheter discontinued intact. Site without signs and symptoms of complications. Dressing and pressure applied.   An After Visit Summary was printed and given to the patient.    D/C education completed with patient/family including follow up instructions, medication list, d/c activities limitations if indicated, with other d/c instructions as indicated by MD - patient able to verbalize understanding, all questions fully answered.    Patient instructed to return to ED, call 911, or call MD for any changes in condition.    Patient escorted via [place], and D/C home via private car.

## 2019-06-30 NOTE — ED Notes (Signed)
Ambulated to bathroom.  Needed one person assist.  States she has improved since arrival but still not strong enough to walk on her own

## 2019-06-30 NOTE — Progress Notes (Addendum)
Inpatient Diabetes Program Recommendations  AACE/ADA: New Consensus Statement on Inpatient Glycemic Control (2015)  Target Ranges:  Prepandial:   less than 140 mg/dL      Peak postprandial:   less than 180 mg/dL (1-2 hours)      Critically ill patients:  140 - 180 mg/dL   Results for [name], [name] (MRN [id number]) as of 06/30/2019 07:18  Ref. Range 06/29/2019 20:43  Glucose-Capillary Latest Ref Range: 70 - 99 mg/dL 346 (H)  125 mg Solumedrol given at 10:33pm   Results for [name], [name] (MRN [id number]) as of 06/30/2019 12:27  Ref. Range 06/30/2019 08:18 06/30/2019 11:11  Glucose-Capillary Latest Ref Range: 70 - 99 mg/dL 318 (H)  7 units NOVOLOG  314 (H)  7 units NOVOLOG    Results for [name], [name] (MRN [id number]) as of 06/30/2019 09:48  Ref. Range 06/30/2019 04:04  Hemoglobin A1C Latest Ref Range: 4.8 - 5.6 % 9.1 ((214 mg/dl)   Admit with: Slurred Speech  History: DM (type 1 per notes), CVA, Sarcoidosis, COPD  Home DM Meds: Tresiba 60 units Daily       Novolog 12 units TID if CBG >150       Metformin 500 mg Daily  Current Orders: Novolog Sensitive Correction Scale/ SSI (0-9 units) TID AC + HS      Insulin Degludec 60 units Daily (Signed and Held)      Metformin 500 mg Daily (Signed and Held)   [facility]     MD- Will need to switch basal insulin to Lantus since hospital does not carry Insulin degludec Tyler Aas)  Please make sure pt starts her basal insulin this AM (Type 1 diabetic)  Please also add Hemoglobin A1c to current labs    Addendum 2:40pm--Spoke with patient about her current A1c of 9.1%.   Explained what an A1c is and what it measures.  Reminded patient that her goal A1c is 7% or less per ADA standards to prevent both acute and long-term complications.  Explained to patient the extreme importance of good glucose control at home.  Encouraged patient to check her CBGs at least TID [facility] and to record all CBGs in a logbook for  her PCP to review.  Pt did not have any questions for me regarding her care.  Plans to make follow up appt with her PCP soon after d/c.  Has Insulin and CBG meter and supplies at home.  Remembers to rotate insulin injection sites.       --Will follow patient during hospitalization--  Wyn Quaker RN, MSN, CDE Diabetes Coordinator Inpatient Glycemic Control Team Team Pager: [phone] (8a-5p)

## 2019-06-30 NOTE — Discharge Instructions (Signed)
Per [place] DMV statutes, patients with seizures are not allowed to drive until they have been seizure-free for six months.    Use caution when using heavy equipment or power tools. Avoid working on ladders or at heights. Take showers instead of baths. Ensure the water temperature is not too high on the home water heater. Do not go swimming alone. Do not lock yourself in a room alone (i.e. bathroom). When caring for infants or small children, sit down when holding, feeding, or changing them to minimize risk of injury to the child in the event you have a seizure. Maintain good sleep hygiene. Avoid alcohol.    If patient has another seizure, call 911 and bring them back to the ED if: A.  The seizure lasts longer than 5 minutes.      B.  The patient doesn't wake shortly after the seizure or has new problems such as difficulty seeing, speaking or moving following the seizure C.  The patient was injured during the seizure D.  The patient has a temperature over 102 F (39C) E.  The patient vomited during the seizure and now is having trouble breathing  

## 2019-06-30 NOTE — ED Notes (Signed)
Breakfast Ordered 

## 2019-06-30 NOTE — ED Notes (Signed)
Patient and family member given fluids per request.  Informed of continued wait for admission bed.  Denies any other needs or complaints at this time

## 2019-07-17 ENCOUNTER — Other Ambulatory Visit: Payer: Self-pay

## 2019-07-17 ENCOUNTER — Telehealth (INDEPENDENT_AMBULATORY_CARE_PROVIDER_SITE_OTHER): Payer: Medicaid Other | Admitting: Neurology

## 2019-07-17 ENCOUNTER — Encounter: Payer: Self-pay | Admitting: Neurology

## 2019-07-17 DIAGNOSIS — G40B09 Juvenile myoclonic epilepsy, not intractable, without status epilepticus: Secondary | ICD-10-CM | POA: Diagnosis not present

## 2019-07-17 DIAGNOSIS — R519 Headache, unspecified: Secondary | ICD-10-CM

## 2019-07-17 DIAGNOSIS — G459 Transient cerebral ischemic attack, unspecified: Secondary | ICD-10-CM

## 2019-07-17 MED ORDER — LAMOTRIGINE 150 MG PO TABS: 150.0000 mg | ORAL_TABLET | Freq: Two times a day (BID) | ORAL | 3 refills | Status: DC

## 2019-07-17 MED ORDER — LEVETIRACETAM 500 MG PO TABS: 500.0000 mg | ORAL_TABLET | Freq: Two times a day (BID) | ORAL | 3 refills | Status: DC

## 2019-07-17 MED ORDER — ASPIRIN EC 81 MG PO TBEC: 81.0000 mg | DELAYED_RELEASE_TABLET | Freq: Every day | ORAL | 11 refills | Status: AC

## 2019-07-17 NOTE — Progress Notes (Signed)
Virtual Visit via Video Note The purpose of this virtual visit is to provide medical care while limiting exposure to the novel coronavirus.    Consent was obtained for video visit:  Yes.   Answered questions that patient had about telehealth interaction:  Yes.   I discussed the limitations, risks, security and privacy concerns of performing an evaluation and management service by telemedicine. I also discussed with the patient that there may be a patient responsible charge related to this service. The patient expressed understanding and agreed to proceed.  Pt location: Home Physician Location: office Name of referring provider:  Sherene Sires, DO I connected with [name] at patients initiation/request on 07/17/2019 at  3:30 PM EST by video enabled telemedicine application and verified that I am speaking with the correct person using two identifiers. Pt MRN:  [id number] Pt DOB:  [birth year] Video Participants:  [name]   History of Present Illness:  The patient had a virtual video visit on 07/17/2019. She was last seen in the neurology clinic in October 2019 for juvenile myoclonic epilepsy and headaches. She is taking Lamotrigine 150mg  BID and Levetiracetam 500mg  BID. She was previously on Topiramate 75mg  qhs headache prophylaxis and gabapentin for nerve damage and fibromyalgia, but these are not on her medication list. She presents after hospitalization last 06/29/2019 when she started having slurred speech, left-sided numbness, disorientation. She had a left-sided headache. She went to the ER with unremarkable brain imaging, unable to do CTA due to IV dye allergy, MRI due to ICD. She reports that she suddenly felt very tired and tried to call 911, but could not understand how to do it, so she just called the last person she had called who was her daughter. When she got to the hospital, she could not talk or understand, it took a couple of hours before they could understand her. She  reports her mouth was twisted on the left side even when she could talk/understand. She reports her left arm and leg were weaker when they were trying to get her to the wheelchair, left side was deadweight. Right side moved a little but was weak. She had a headache while in the hospital, she felt this happened after she was given an IV, then after Keppra, headache resolved. She lives alone but family feels she should not because her memory has been worse. If she does not write down how to take her medication, even if it is on the bottle, she would not remember it. She has forgotten bills until she gets a reminder call. She has not been driving except for today's visit. Because she has been worried about her health, headaches have come back, but she was not having them prior to the ER visit. She reports aspirin burns her stomach in the past, no bleeding.    History on Initial Assessment 11/05/2016: This is a 49 year old ambidextrous left-hand dominant woman with a history of sarcoidosis (pulmonary and hepatic), diabetes, ventricular tachycardia s/p AICD placement, long Q-T syndrome, juvenile myoclonic epilepsy, with seizures and headaches. She had been seeing neurologist Dr. Felecia Shelling and states that she grew up never treated for seizures but from time to time was told she had symptoms that sounded like seizure. She started seeing Neurology in her late 11s when she had episodes where she would briefly blackout and become confused, not knowing what was going on. She would have quick body jerks but no convulsive activity. She was taking clonazepam at that time  but was not on medication when she started seeing Dr. Felecia Shelling in October 2017. An EEG done 03/20/2016 showed several 3-6 second bursts of generalized 3 Hz polyspike wave activity. There were also some temporal sharp waves seen. EEG reported as consistent with juvenile myoclonic epilepsy. She was started on Lamotrigine in November 2017, increased to 100mg  BID.  Clonazepam 0.5mg  BID was added in November 2017, but per records it is prn for anxiety. Keppra 500mg  BID was added in March 2018 for seizure and headache prophylaxis. She reports last blackout episode was in March 2018. She continues to have myoclonic jerks a few times a week affecting her arms. They can occur anytime during the day. She denies any convulsions but has woken up with her tongue bitten and her bed wet. Last episode of waking up with tongue bite was last week. She reports a history of bad headaches since elementary school, but now she feels her head is full with constant 4/10 pain waxing and waning in severity to 10/10 3-4 times a month. Pain is usually on the left parietal or vertex region, sometimes with pressure or throbbing on the left side. She has nausea and vomiting, occasional photo and phonophobia. She takes Tylenol on occasion, a small bottle lasts her a month, but recently she has not been taking anything. Occipital nerve block did not help in the past. She usually gets 3-4 hours of sleep due to chronic neck and back pain. She reports her mind is always wandering. She has a history of fibromyalgia and her body is always sore. She has diabetic neuropathy, and reports that there is always some tingling or numbness somewhere in her arms and legs. She has occasional constipation. She reports a fall last Wednesday. She denies any significant head injuries. She has been taking gabapentin for neuropathy for the past 4-5 years. She has occasional anxiety. She feels like her left leg drags more. She had seen ophthalmologist Dr. Tama High and was told there was "increased pressure," records will be requested for review. She denies any olfactory/gustatory hallucinations, deja vu, rising epigastric sensation. Her mother had migraines, maternal grandfather had migraines.   Epilepsy Risk Factors:  There is a strong family history of seizures in her maternal grandfather, maternal uncles. She had a  normal birth and early development.  There is no history of febrile convulsions, CNS infections such as meningitis/encephalitis, significant traumatic brain injury, neurosurgical procedures.   I personally reviewed head CT without contrast done 11/2014 which did not show any acute changes.  MEDICATIONS: Current Outpatient Medications on File Prior to Visit  Medication Sig Dispense Refill  . ACCU-CHEK SOFTCLIX LANCETS lancets Use as instructed 100 each 12  . acetaminophen (TYLENOL) 500 MG tablet Take 1,000 mg by mouth every 6 (six) hours as needed for mild pain or fever. Reported on 07/22/2015    . baclofen (LIORESAL) 10 MG tablet Take 1 tablet (10 mg total) by mouth 2 (two) times daily. (Patient taking differently: Take 10 mg by mouth 2 (two) times daily as needed (back pain). ) 60 each 0  . beclomethasone (QVAR) 80 MCG/ACT inhaler Inhale 2 puffs into the lungs 2 (two) times daily. 1 Inhaler 5  . Camphor-Menthol-Methyl Sal (SALONPAS) 3.05-23-08 % PTCH Apply 1 patch topically daily as needed (pain).    . cycloSPORINE (RESTASIS) 0.05 % ophthalmic emulsion Place 1 drop daily into both eyes.    . fluconazole (DIFLUCAN) 200 MG tablet Take 1 tablet (200 mg total) by mouth every 3 (three) days.  3 tablet 2  . fluticasone (FLONASE) 50 MCG/ACT nasal spray Place 2 sprays into both nostrils daily as needed for allergies.     Marland Kitchen glucose blood (ACCU-CHEK GUIDE) test strip Use as instructed 100 each 12  . Insulin Degludec (TRESIBA FLEXTOUCH) 200 UNIT/ML SOPN Inject 60 Units into the skin daily at 6 (six) AM. 3 pen 3  . Insulin Pen Needle 31G X 8 MM MISC BD UltraFine III Pen Needles. For use with insulin pen device. Inject insulin 6 x daily 200 each 3  . lamoTRIgine (LAMICTAL) 150 MG tablet Take 1 tablet (150 mg total) by mouth 2 (two) times daily. 60 tablet 5  . Lancet Devices (ACCU-CHEK SOFTCLIX) lancets Use as instructed 1 each 0  . levETIRAcetam (KEPPRA) 500 MG tablet Take 1 tablet (500 mg total) by mouth 2 (two)  times daily. 180 tablet 3  . metFORMIN (GLUCOPHAGE-XR) 500 MG 24 hr tablet Take 500 mg by mouth daily with breakfast.    . metroNIDAZOLE (METROGEL) 0.75 % vaginal gel PLACE 1 APPLICATORFUL VAGINALLY TWICE DAILY (Patient taking differently: Place 1 Applicatorful vaginally daily as needed (irritation). ) 70 g 2  . naproxen (NAPROSYN) 375 MG tablet Take 1 tablet (375 mg total) by mouth 2 (two) times daily with a meal. 60 tablet 3  . NOVOLOG FLEXPEN 100 UNIT/ML FlexPen Inject 12u up to 3 times per day with meals as long as sugar is >150 15 mL 0  . Olopatadine HCl 0.7 % SOLN Place 1 drop into both eyes daily as needed (eye allergies).     . potassium chloride SA (K-DUR,KLOR-CON) 20 MEQ tablet Take 1 tablet (20 mEq total) by mouth daily. (Patient taking differently: Take 20 mEq by mouth every other day. ) 30 tablet 2  . Prenatal Vit-Fe Fumarate-FA (PREPLUS) 27-1 MG TABS Take 1 tablet by mouth daily before breakfast. 30 tablet 11  . propranolol (INDERAL) 80 MG tablet TAKE 1 TABLET BY MOUTH TWICE A DAY (Patient taking differently: Take 80 mg by mouth 2 (two) times daily. ) 30 tablet 11   No current facility-administered medications on file prior to visit.    Observations/Objective:   GEN:  The patient appears stated age and is in NAD.  Neurological examination: Patient is awake, alert, oriented x 3. No aphasia or dysarthria. Intact fluency and comprehension. Remote and recent memory intact. Able to name and repeat. Cranial nerves: Extraocular movements intact with no nystagmus. No facial asymmetry. Motor: moves all extremities symmetrically, at least anti-gravity x 4. No incoordination on finger to nose testing. Gait: slow and cautious, no ataxia   Assessment and Plan:   This is a 49 yo ambidextrous left-hand dominant woman with a history of sarcoidosis (pulmonary and hepatic), diabetes, ventricular tachycardia s/p AICD placement, long Q-T syndrome, with juvenile myoclonic epilepsy and migraines. Prior  EEG at Dr. Garth Bigness office reported generalized 3 Hz polyspike wave discharges consistent with a generalized epilepsy. She is on Lamotrigine 150mg  BID and Levetiracetam 500mg  BID. She had a transient episode of left-sided weakness and aphasia alst 06/29/2019. Unable to do MRI due to ICD, unable to do CTA due to dye allergy. CT head did not show any acute changes. Etiology of symptoms unclear, considerations include TIA, complicated migraine, seizure. repeat EEG will be ordered. She is interested in speaking to her cardiologist about MRI-compatible device. Continue current medications for now. She reports GI upset with aspirin, but is agreeable to a trial of enteric coated ASA.  She is aware of  driving  laws to stop driving after a seizure, until 6 months seizure-free. She knows to go to the ER for any sudden change in symptoms. She will follow-up in 3-4 months and knows to call for any changes   Follow Up Instructions:   -I discussed the assessment and treatment plan with the patient. The patient was provided an opportunity to ask questions and all were answered. The patient agreed with the plan and demonstrated an understanding of the instructions.   The patient was advised to call back or seek an in-person evaluation if the symptoms worsen or if the condition fails to improve as anticipated.    Cameron Sprang, MD

## 2019-08-03 ENCOUNTER — Telehealth: Payer: Self-pay

## 2019-08-03 NOTE — Telephone Encounter (Signed)
I called [employer] and ordered the pt a monitor. They are going to have the monitor expedited to her.

## 2019-08-10 ENCOUNTER — Ambulatory Visit (INDEPENDENT_AMBULATORY_CARE_PROVIDER_SITE_OTHER): Payer: Medicaid Other | Admitting: *Deleted

## 2019-08-10 DIAGNOSIS — I4581 Long QT syndrome: Secondary | ICD-10-CM | POA: Diagnosis not present

## 2019-08-10 LAB — CUP PACEART REMOTE DEVICE CHECK
Battery Remaining Longevity: 97 mo
Battery Voltage: 3.01 V
Brady Statistic RV Percent Paced: 0.01 %
Date Time Interrogation Session: 20210325010107
HighPow Impedance: 51 Ohm
HighPow Impedance: 78 Ohm
Implantable Lead Implant Date: 20080828
Implantable Lead Location: 753860
Implantable Lead Model: 6947
Implantable Pulse Generator Implant Date: 20161117
Lead Channel Impedance Value: 323 Ohm
Lead Channel Impedance Value: 399 Ohm
Lead Channel Pacing Threshold Amplitude: 1.375 V
Lead Channel Pacing Threshold Pulse Width: 0.4 ms
Lead Channel Sensing Intrinsic Amplitude: 4.875 mV
Lead Channel Sensing Intrinsic Amplitude: 4.875 mV
Lead Channel Setting Pacing Amplitude: 2.75 V
Lead Channel Setting Pacing Pulse Width: 0.4 ms
Lead Channel Setting Sensing Sensitivity: 0.3 mV

## 2019-08-10 NOTE — Progress Notes (Signed)
ICD Remote  

## 2019-08-17 NOTE — Telephone Encounter (Signed)
complete

## 2019-08-24 ENCOUNTER — Ambulatory Visit (INDEPENDENT_AMBULATORY_CARE_PROVIDER_SITE_OTHER): Payer: Medicaid Other | Admitting: Family Medicine

## 2019-08-24 ENCOUNTER — Encounter: Payer: Self-pay | Admitting: Family Medicine

## 2019-08-24 ENCOUNTER — Other Ambulatory Visit: Payer: Self-pay

## 2019-08-24 VITALS — BP 148/84 | HR 82 | Wt 181.0 lb

## 2019-08-24 DIAGNOSIS — M722 Plantar fascial fibromatosis: Secondary | ICD-10-CM | POA: Diagnosis not present

## 2019-08-24 DIAGNOSIS — R634 Abnormal weight loss: Secondary | ICD-10-CM | POA: Diagnosis not present

## 2019-08-24 DIAGNOSIS — F439 Reaction to severe stress, unspecified: Secondary | ICD-10-CM

## 2019-08-24 NOTE — Patient Instructions (Signed)
It was a pleasure to see you today! Thank you for choosing [facility] for your primary care. [name] was seen for foot pain, emotional concerns and back pain. Come back to the clinic if there is anything we can do for you or you want the referals placed to PT or to the foot/ankle center.  If you are feeling suicidal or depression symptoms worsen please immediately go to:   24 Hour Availability [facility]  [address], [place], [place] [address]  [phone] or [phone]  . If you are thinking about harming yourself or having thoughts of suicide, or if you know someone who is, seek help right away. . Call your doctor or mental health care provider. . Call 911 or go to a hospital emergency room to get immediate help, or ask a friend or family member to help you do these things. . Call the [place] National Suicide Prevention Lifeline's toll-free, 24-hour hotline at 1-800-273-TALK (218) 555-1930) or TTY: 1-800-799-4 TTY (604)458-1820) to talk to a trained counselor. . If you are in crisis, make sure you are not left alone.  . If someone else is in crisis, make sure he or she is not left alone   Family Service of the [employer] (Domestic Violence, Rape & Victim Assistance [phone]  [facility]  [address], [place]  [address]               [phone] or [phone]  [place]    (ONLY from 8am-4pm)    [phone]  [facility] Mobile Crisis Unit (24/7)   8060072423  [place] National Suicide Hotline   684-867-7780 Diamantina Monks)   Therapy and Counseling Resources Most providers on this list will take Medicaid. Patients with commercial insurance or Medicare should contact their insurance company to get a list of in network providers.  [employer]  [address] [place], [place] [address]      [phone]  [place] [address] Suite [place],  [address]       [place] [address], [place], [place]    Jinny Blossom [facility] [address], [place], [place]  Family Solutions:  [place]  [facility]:  [place] [address] Loni Muse, [place]  [facility] (under & uninsured) [address] [place] [phone]    [email or web address]    [facility] [place]     Phone:  [phone]     [place]  [place] [address]      [place], [place] [address] [place]: [place], [place], [place]   [place] ([place] therapist) [address] [place] [address]   [place] [address]    [phone]    [facility]   [place],  [place], [place]   [place]  [place]  [address] [place], [place]        [phone]  Open Access/Walk In Clinic under & uninsured [place], To schedule an appointment call [phone]  [facility] [phone]):  Mon - Fri from 8 AM - 3 PM  Family Service of the [place],  ([place])   [place], [place]: [phone]) 8:30 - 12; 1 - 2:30  Family Service of the [place],  [place], [place]    [phone]):8:30 - 12; 2 - 3PM  [facility] [employer],  [address],  [place]; [phone]):   Mon - Fri 8 AM - 5 PM  [facility] [place]  MWF 12:30 to 3:00 or call to schedule an appointment  [phone]  Specific Provider options Psychology Today  [email or web address] 1. click  on find a therapist  2. enter your zip code 3. left side and select or tailor a therapist for your specific need.   [facility] Provider Directory [email or web address]  (Medicaid)   Follow all drop down to find a provider  [place] or [email or web address] [address], [place], [place] Recovery support and educational   In home counseling [place] Telephone: [phone]  office in [place] [email or web address]   Does not take reg. Medicaid or Medicare private insurance [facility], [place] health Choice, [facility], [place], [place], [place], [place] Health Choice  24- Hour Availability:  . [place] or [phone]  . Family Service of the [employer] [phone]  [facility] Crisis Service  [phone]   . [place]  [phone] (after hours)  . Therapeutic Alternative/Mobile Crisis   [phone]  . [place] National Suicide Hotline  4373119587 ([place])  . Call 911 or go to emergency room  . [employer]  [phone]);  [place] and [employer]   . [employer] ACCESS  [phone]); [place], [place], [place], [place], [place], [place], [place]      Please bring all your medications to every doctors visit   Sign up for My Chart to have easy access to your labs results, and communication with your Primary care physician.     Please check-out at the front desk before leaving the clinic.     Best,  Dr. Sherene Sires FAMILY MEDICINE RESIDENT - PGY3 08/24/2019 3:17 PM

## 2019-08-25 DIAGNOSIS — R4589 Other symptoms and signs involving emotional state: Secondary | ICD-10-CM | POA: Insufficient documentation

## 2019-08-25 DIAGNOSIS — M722 Plantar fascial fibromatosis: Secondary | ICD-10-CM | POA: Insufficient documentation

## 2019-08-25 DIAGNOSIS — F439 Reaction to severe stress, unspecified: Secondary | ICD-10-CM | POA: Insufficient documentation

## 2019-08-25 NOTE — Progress Notes (Signed)
    SUBJECTIVE:   CHIEF COMPLAINT / HPI:   Plantar fasciitis, right Significant point tenderness to anterior plantar surface of calcaneus.  No traumatic injury, no distal deficits and skin changes.  Stress at home Patient with significant chronic medical issues that she says have become the way on her emotionally.  She denies currently any notion of suicidal ideation and says that she does have plans and want to enjoy her life, she says it has been difficult for him to activities just due to the pain and that stress is weighing on her.  Weight loss Patient currently 180 pounds, she said that she was told by her cardiologist that her goal weight is 150.  She would like to talk to nutrition   PERTINENT  PMH / PSH:   OBJECTIVE:   BP (!) 148/84   Pulse 82   Wt 181 lb (82.1 kg)   LMP 07/23/2014   SpO2 99%   BMI 28.35 kg/m   General: Pleasant and in no acute distress, is walking with a limp Cardiac: Regular rate and rhythm Pulmonary: No increased work of breathing, no cough MSK: No tenderness to toes or dorsal surface of foot, ankle without pain or inflammation.  All distal sensation and perfusion is intact, there are no skin changes on the plantar surface of the calcaneus but there is very significant point tenderness consistent with plantar fasciitis  ASSESSMENT/PLAN:   Plantar fasciitis, right Significant point tenderness to anterior plantar surface of calcaneus.  No traumatic injury, no distal deficits and skin changes.  Expect plantar fasciitis, did discuss rolling with frozen water bottle as well as stretches and showed her where to purchase a night splint for work tonight if she wishes.  Will also start wearing shoes as well as walking around the house for support instead of barefoot.  If she calls back saying that these conservative measures have failed we can refer her to podiatry for consideration of potential steroid injection  Stress at home Patient with significant  chronic medical issues that she says have become the way on her emotionally.  She denies currently any notion of suicidal ideation and says that she does have plans and want to enjoy her life, she says it has been difficult for him to activities just due to the pain and that stress is weighing on her.  She said that she does not need therapy at this point and does not want to start medication because she does not feel that she is depressed just "tired ", she did except therapy contact information and understands I am providing suicide hotline just in case she gets to that state emotionally.  Weight loss Patient currently 180 pounds, she said that she was told by her cardiologist that her goal weight is 150.  She would like to talk to nutrition about ways to reach this goal and has accepted referral.     Sherene Sires, [place]

## 2019-08-25 NOTE — Assessment & Plan Note (Signed)
Patient currently 180 pounds, she said that she was told by her cardiologist that her goal weight is 150.  She would like to talk to nutrition about ways to reach this goal and has accepted referral.

## 2019-08-25 NOTE — Assessment & Plan Note (Signed)
Significant point tenderness to anterior plantar surface of calcaneus.  No traumatic injury, no distal deficits and skin changes.  Expect plantar fasciitis, did discuss rolling with frozen water bottle as well as stretches and showed her where to purchase a night splint for work tonight if she wishes.  Will also start wearing shoes as well as walking around the house for support instead of barefoot.  If she calls back saying that these conservative measures have failed we can refer her to podiatry for consideration of potential steroid injection

## 2019-08-25 NOTE — Assessment & Plan Note (Signed)
Patient with significant chronic medical issues that she says have become the way on her emotionally.  She denies currently any notion of suicidal ideation and says that she does have plans and want to enjoy her life, she says it has been difficult for him to activities just due to the pain and that stress is weighing on her.  She said that she does not need therapy at this point and does not want to start medication because she does not feel that she is depressed just "tired ", she did except therapy contact information and understands I am providing suicide hotline just in case she gets to that state emotionally.

## 2019-08-31 ENCOUNTER — Other Ambulatory Visit: Payer: Self-pay

## 2019-08-31 DIAGNOSIS — M545 Low back pain, unspecified: Secondary | ICD-10-CM

## 2019-08-31 DIAGNOSIS — G8929 Other chronic pain: Secondary | ICD-10-CM

## 2019-09-01 MED ORDER — NAPROXEN 375 MG PO TABS: 375.0000 mg | ORAL_TABLET | Freq: Two times a day (BID) | ORAL | 3 refills | Status: DC

## 2019-09-13 ENCOUNTER — Ambulatory Visit: Payer: Medicaid Other | Admitting: Gastroenterology

## 2019-09-13 ENCOUNTER — Encounter: Payer: Self-pay | Admitting: Gastroenterology

## 2019-09-13 VITALS — BP 140/80 | HR 75 | Temp 97.8°F | Ht 67.0 in | Wt 182.0 lb

## 2019-09-13 DIAGNOSIS — K219 Gastro-esophageal reflux disease without esophagitis: Secondary | ICD-10-CM

## 2019-09-13 DIAGNOSIS — D8689 Sarcoidosis of other sites: Secondary | ICD-10-CM | POA: Diagnosis not present

## 2019-09-13 DIAGNOSIS — R1032 Left lower quadrant pain: Secondary | ICD-10-CM | POA: Diagnosis not present

## 2019-09-13 DIAGNOSIS — K5909 Other constipation: Secondary | ICD-10-CM | POA: Diagnosis not present

## 2019-09-13 DIAGNOSIS — Z86010 Personal history of colonic polyps: Secondary | ICD-10-CM

## 2019-09-13 MED ORDER — PANTOPRAZOLE SODIUM 20 MG PO TBEC: 20.0000 mg | DELAYED_RELEASE_TABLET | Freq: Every day | ORAL | 3 refills | Status: DC | PRN

## 2019-09-13 MED ORDER — LINACLOTIDE 290 MCG PO CAPS: 290.0000 ug | ORAL_CAPSULE | Freq: Every day | ORAL | 1 refills | Status: DC

## 2019-09-13 NOTE — Progress Notes (Signed)
HPI :  49 year old female here for follow-up visit for hepatic sarcoidosis.  She also has a history of type 1 diabetes, prolonged QT syndrome, status post ICD, SVT status post ablation, history of epilepsy with questionable recent TIA.  She previously had a [place] with elastography done in 2017 showing a heterogenous liver without mass lesion, with elastography reported with F3-F4 score with high risk of fibrosis. She has had labs for chronic liver diseases which have been unremarkable other than elevated total IgG, with mild positive SMA, and negative ANA.She had a liver biopsy 08/02/15 - results c/w hepatic sarcoidosis with fibrotic changes but no evidence of cirrhosis. Portal pressures werenormal. At that time she was treated with high dose steroids for her sarcoidosis and her LFTs have since normalized.  Over the past few years her LFTs have remained normal and she has been doing really well.  She denies any new cardiopulmonary symptoms.  She is due to see her pulmonologist next week for sarcoidosis.  She has normal platelets.  No clinical signs of cirrhosis at this time.  She has not had an imaging of her liver in a few years.  She is taking Linzess for her constipation is working really well.  She has regular bowel movements.  No blood in her stools.  She has no family history of known colon cancer.  She is overdue for surveillance colonoscopy due to multiple adenomas removed in 04/2017, due for surveillance in December of this year.  She does complain of some intermittent pain in her left lower quadrant to left inguinal area.  At the time I saw her a few years ago this had been mostly inguinal pain.  She states it is now left lower quadrant inguinal, bothers her periodically.  No true triggers.  Not related to eating, not related to her bowel movements.  She otherwise has been taking Protonix as needed for GERD in the past.  She has been out of this since January.  Using Alka-Seltzer as needed  which has not been working as well.  She still gets occasional reflux symptoms that bother her.  She has not been using much of any NSAIDs, using Tylenol only as needed.  She is on a baby aspirin for questionable history of TIA per neurology.   Priorworkup: 10/24/14 Upper endoscopy:Normal, no varices noted 10/25/14 oral contrast only CT abdomen pelvis: Probable mild hepatosplenomegaly. Enlarged uterus containing large left leiomyoma.  12/03/14 Gastric emptying study normal Colonoscopy 2006 - normal Flex sig 2010 - hemorrhoids Flex sig 2011 - banded hemorrhoids Colonoscopy 04/19/2017 - 3 polyps removed, largest 63mm in size - adenomas, repeat in 3 years    [place] with elastography 07/04/15 - IMPRESSION: Contracted gallbladder.  Heterogeneous appearing liver with borderline splenic enlargement.  Median hepatic shear wave velocity is calculated at 2.62 m/sec.  Corresponding Metavir fibrosis score is F4 & some F3.  Risk of fibrosis is high.  Follow-up:  Advised   Past Medical History:  Diagnosis Date  . Abdominal pain, periumbilic   . Allergic rhinitis   . Allergy   . Anemia   . Anxiety   . Arthritis   . Asthma   . Automatic implantable cardioverter-defibrillator in situ 2006, replaced 2008   [employer]TRONIC  . Bronchitis   . Cancer ([place])    Nodules in lungs pt believes were cancerous pt unsure  . COPD (chronic obstructive pulmonary disease) ([place])   . DM (diabetes mellitus) ([place]) 1997/1998   type 1, initially gestational but soon after  child birth developed IDDM  . Esophageal stricture 2005/2009   esophageal strictures dilated 2005, 2009  . Eye hemorrhage    bilateral  . Fibroid, uterine   . Fibromyalgia   . GERD (gastroesophageal reflux disease) 2005  . History of blood transfusion years ago  . Hypertension   . Internal hemorrhoids 2010   2011 band ligation of int rrhoids.  . Long Q-T syndrome   . Neuropathy in diabetes ([place])   . Palpitation   . Sarcoidosis   .  Seizure disorder ([place])    none recently  . Stroke ([place])    QUESTIONABLE TIA      Past Surgical History:  Procedure Laterality Date  . AUTOMATIC IMPLANTABLE CARDIAC DEFIBRILLATOR SITU  2006,    ICD-Medtronic   Remote - Yes   . CERVIX REMOVAL    . DILITATION & CURRETTAGE/HYSTROSCOPY WITH HYDROTHERMAL ABLATION N/A 06/07/2013   Procedure: DILATATION & CURETTAGE/HYSTEROSCOPY WITH attempted HYDROTHERMAL ABLATION;  Surgeon: Frederico Hamman, MD;  Location: [facility];  Service: Gynecology;  Laterality: N/A;  . EP IMPLANTABLE DEVICE N/A 04/04/2015   Procedure:  ICD Generator Changeout;  Surgeon: Evans Lance, MD;  Location: [facility];  Service: Cardiovascular;  Laterality: N/A;  . ESOPHAGOGASTRODUODENOSCOPY (EGD) WITH PROPOFOL N/A 10/24/2014   Procedure: ESOPHAGOGASTRODUODENOSCOPY (EGD) WITH PROPOFOL;  Surgeon: Inda Castle, MD;  Location: [facility];  Service: Endoscopy;  Laterality: N/A;  . EXPLORATORY LAPAROTOMY  11/06/14   evacuation of post TAH/BSO hematoma  . HAND SURGERY Right    x 2  . HEMORRHOID BANDING  03/2010  . SALPINGECTOMY    . SVT ablation    . TOTAL ABDOMINAL HYSTERECTOMY W/ BILATERAL SALPINGOOPHORECTOMY  11/06/14   at [facility]. for menorrhagia, pelvic pain and enlarging uterine fibroids  . TUBAL LIGATION    . VIDEO BRONCHOSCOPY Bilateral 01/24/2015   Procedure: VIDEO BRONCHOSCOPY WITHOUT FLUORO;  Surgeon: Marshell Garfinkel, MD;  Location: [facility];  Service: Cardiopulmonary;  Laterality: Bilateral;   Family History  Problem Relation Age of Onset  . Heart attack Mother   . Lung cancer Maternal Uncle        x2  . Breast cancer Maternal Aunt        x2  . Stomach cancer Paternal Uncle   . Stomach cancer Paternal Grandfather   . Stomach cancer Paternal Grandmother   . Diabetes Father   . Positive PPD/TB Exposure Father   . Glaucoma Father   . Esophageal cancer Paternal Aunt   . Colon cancer Maternal Grandfather    Social History   Tobacco Use  . Smoking  status: Never Smoker  . Smokeless tobacco: Never Used  Substance Use Topics  . Alcohol use: Yes    Alcohol/week: 0.0 standard drinks    Comment: None since 2015-was occasional  . Drug use: Never   Current Outpatient Medications  Medication Sig Dispense Refill  . ACCU-CHEK SOFTCLIX LANCETS lancets Use as instructed 100 each 12  . acetaminophen (TYLENOL) 500 MG tablet Take 1,000 mg by mouth every 6 (six) hours as needed for mild pain or fever. Reported on 07/22/2015    . aspirin EC 81 MG tablet Take 1 tablet (81 mg total) by mouth daily. 30 tablet 11  . baclofen (LIORESAL) 10 MG tablet Take 1 tablet (10 mg total) by mouth 2 (two) times daily. (Patient taking differently: Take 10 mg by mouth 2 (two) times daily as needed (back pain). ) 60 each 0  . beclomethasone (QVAR) 80 MCG/ACT inhaler Inhale  2 puffs into the lungs 2 (two) times daily. 1 Inhaler 5  . Camphor-Menthol-Methyl Sal (SALONPAS) 3.05-23-08 % PTCH Apply 1 patch topically daily as needed (pain).    . cycloSPORINE (RESTASIS) 0.05 % ophthalmic emulsion Place 1 drop daily into both eyes.    . fluconazole (DIFLUCAN) 200 MG tablet Take 1 tablet (200 mg total) by mouth every 3 (three) days. 3 tablet 2  . fluticasone (FLONASE) 50 MCG/ACT nasal spray Place 2 sprays into both nostrils daily as needed for allergies.     Marland Kitchen glucose blood (ACCU-CHEK GUIDE) test strip Use as instructed 100 each 12  . Insulin Degludec (TRESIBA FLEXTOUCH) 200 UNIT/ML SOPN Inject 60 Units into the skin daily at 6 (six) AM. 3 pen 3  . Insulin Pen Needle 31G X 8 MM MISC BD UltraFine III Pen Needles. For use with insulin pen device. Inject insulin 6 x daily 200 each 3  . lamoTRIgine (LAMICTAL) 150 MG tablet Take 1 tablet (150 mg total) by mouth 2 (two) times daily. 180 tablet 3  . Lancet Devices (ACCU-CHEK SOFTCLIX) lancets Use as instructed 1 each 0  . levETIRAcetam (KEPPRA) 500 MG tablet Take 1 tablet (500 mg total) by mouth 2 (two) times daily. 180 tablet 3  . metFORMIN  (GLUCOPHAGE-XR) 500 MG 24 hr tablet Take 500 mg by mouth daily with breakfast.    . metroNIDAZOLE (METROGEL) 0.75 % vaginal gel Place 1 Applicatorful vaginally 2 (two) times daily as needed.    . naproxen (NAPROSYN) 375 MG tablet Take 1 tablet (375 mg total) by mouth 2 (two) times daily with a meal. 60 tablet 3  . NOVOLOG FLEXPEN 100 UNIT/ML FlexPen Inject 12u up to 3 times per day with meals as long as sugar is >150 15 mL 0  . Olopatadine HCl 0.7 % SOLN Place 1 drop into both eyes daily as needed (eye allergies).     . potassium chloride SA (K-DUR,KLOR-CON) 20 MEQ tablet Take 1 tablet (20 mEq total) by mouth daily. (Patient taking differently: Take 20 mEq by mouth every other day. ) 30 tablet 2  . Prenatal Vit-Fe Fumarate-FA (PREPLUS) 27-1 MG TABS Take 1 tablet by mouth daily before breakfast. 30 tablet 11  . propranolol (INDERAL) 80 MG tablet TAKE 1 TABLET BY MOUTH TWICE A DAY (Patient taking differently: Take 80 mg by mouth 2 (two) times daily. ) 30 tablet 11   No current facility-administered medications for this visit.   Allergies  Allergen Reactions  . Contrast Media [Iodinated Diagnostic Agents] Anaphylaxis and Shortness Of Breath    Needed to be defibrillated and patient was pre-medicated with radiology 13 hour premeds.   . Iohexol Anaphylaxis     Code: RASH, Desc: PT STATES SHE IS ALLERGIC TO IV CONTRAST 05/28/06/RM, Onset Date: [id number]   . Augmentin [Amoxicillin-Pot Clavulanate] Other (See Comments)    "stomach hurt"  . Codeine Nausea And Vomiting    jittery  . Demerol [Meperidine] Other (See Comments)    dysphoria  . Morphine And Related Nausea And Vomiting  . Lantus [Insulin Glargine]     Patient reports itching     Review of Systems: All systems reviewed and negative except where noted in HPI.   Lab Results  Component Value Date   WBC 9.2 06/30/2019   HGB 14.6 06/30/2019   HCT 43.8 06/30/2019   MCV 89.6 06/30/2019   PLT 254 06/30/2019    Lab Results  Component  Value Date   CREATININE 0.68 06/30/2019   BUN  5 (L) 06/30/2019   NA 136 06/30/2019   K 4.2 06/30/2019   CL 102 06/30/2019   CO2 19 (L) 06/30/2019    Lab Results  Component Value Date   ALT 23 06/30/2019   AST 30 06/30/2019   ALKPHOS 95 06/30/2019   BILITOT 0.9 06/30/2019     Physical Exam: BP 140/80   Pulse 75   Temp 97.8 F (36.6 C)   Ht 5\' 7"  (1.702 m)   Wt 182 lb (82.6 kg)   LMP 07/23/2014   BMI 28.51 kg/m  Constitutional: Pleasant,well-developed, female in no acute distress. Abdominal: Soft, nondistended, mild reproducible tenderness LLQ underneath pannus, extending slightly into inguinal area. . There are no masses palpable.  Extremities: no edema Lymphadenopathy: No cervical adenopathy noted. Neurological: Alert and oriented to person place and time. Skin: Skin is warm and dry. No rashes noted. Psychiatric: Normal mood and affect. Behavior is normal.   ASSESSMENT AND PLAN: 49 year old female here for reassessment the following:  Hepatic sarcoidosis - her liver involvement responded quite well to prolonged course of steroids a few years ago.  As has her lungs.  Her LFTs have remained normal for the past 3 to 4 years since therapy.  She had no evidence of cirrhosis on prior biopsy although did have some fibrotic changes.  Her platelets have been normal as has her INR historically.  She is not had imaging in the past few years.  I think it is reasonable to provide interval imaging to assess for evidence of cirrhosis and ensure no concerning interval changes.  Initially we were going to do this with ultrasound however given her abdominal pain as outlined, will obtain CT scan and look at it that way.  We will also assess for splenomegaly etc.  She agreed with the plan, await imaging, continue to follow LFTs every 6 to 12 months.  LLQ pain - intermittent left lower quadrant pain that can radiate into her inguinal area as well.  She has some focal tenderness underneath the  pannus on the left side.  Is quite possible this could be musculoskeletal however she states it is worsening recently and bother her more frequently.  Will obtain a CT scan to image and ensure no concerning pathology.  Of note she has anaphylaxis to IV contrast and will avoid that.  She has tolerated oral contrast in the past.  Further recommendations pending the results  GERD - she ran out of Protonix and has been dealing with reflux since then, using Alka-Seltzer as needed but not working as well.  We will resume Protonix but placed her on 20 mg a day, I think she may do fine on lower dose.  Can use it as needed.  If she needs higher dosing she can contact me.  Chronic constipation - we will refill Linzess, working well.  History of colon polyps - due for surveillance colonoscopy in December.  Will await her follow-up with neurology regarding questionable TIA.  She seems stable since that event in February.  Cardiac status seems stable but we will reassess her in December.  Anderson Cellar, MD [facility]

## 2019-09-13 NOTE — Patient Instructions (Addendum)
If you are age 48 or older, your body mass index should be between 23-30. Your Body mass index is 28.51 kg/m. If this is out of the aforementioned range listed, please consider follow up with your Primary Care Provider.  If you are age 68 or younger, your body mass index should be between 19-25. Your Body mass index is 28.51 kg/m. If this is out of the aformentioned range listed, please consider follow up with your Primary Care Provider.   _____________________________________________________________  [name] have been scheduled for a CT scan of the abdomen and pelvis at [facility], 1st floor Radiology,  on Tuesday, 09-19-19 at 3:00PM. You should arrive 15 minutes prior to your appointment time for registration. Please follow the written instructions below on the day of your exam:  Please go to [facility] and pick up your oral contrast.  1) Do not eat anything after 11:00AM (4 hours prior to your test)  2)   Please drink 1 bottle of contrast at 1:00pm.        Please drink 1 bottle of contrast at 2:00pm  You may take any medications as prescribed with a small amount of water, if necessary. If you take any of the following medications: METFORMIN, GLUCOPHAGE, GLUCOVANCE, AVANDAMET, RIOMET, FORTAMET, [place] MET, JANUMET, GLUMETZA or METAGLIP, you MAY be asked to HOLD this medication 48 hours AFTER the exam.  The purpose of you drinking the oral contrast is to aid in the visualization of your intestinal tract. The contrast solution may cause some diarrhea. Plan on being at [facility] for 30 minutes or longer, depending on the type of exam you are having performed.  This test typically takes 30-45 minutes to complete.  If you have any questions regarding your exam or if you need to reschedule, you may call Radiology at [phone] between the hours of 8:00 am and 5:00 pm, Monday-Friday.  ______________________________________________________________________  We have sent the following  medications to your pharmacy for you to pick up at your convenience: Protonix 20 mg: Take once a day as needed Linzess 290 mcg: Take daily in the morning   Thank you for entrusting me with your care and for choosing [facility], Dr. [place] Cellar

## 2019-09-18 ENCOUNTER — Ambulatory Visit: Payer: Medicaid Other | Admitting: Pulmonary Disease

## 2019-09-18 ENCOUNTER — Encounter: Payer: Self-pay | Admitting: Pulmonary Disease

## 2019-09-18 ENCOUNTER — Other Ambulatory Visit: Payer: Self-pay

## 2019-09-18 DIAGNOSIS — J449 Chronic obstructive pulmonary disease, unspecified: Secondary | ICD-10-CM

## 2019-09-18 DIAGNOSIS — D869 Sarcoidosis, unspecified: Secondary | ICD-10-CM | POA: Diagnosis not present

## 2019-09-18 MED ORDER — BECLOMETHASONE DIPROPIONATE 80 MCG/ACT IN AERS: 2.0000 | INHALATION_SPRAY | Freq: Two times a day (BID) | RESPIRATORY_TRACT | 5 refills | Status: DC

## 2019-09-18 NOTE — Progress Notes (Signed)
       [name]    [id number]    04/23/1971  Primary Care Physician:Bland, Scott, DO  Referring Physician: Bland, Scott, DO [address] [place],  [place] [address]  Chief complaint:   Follow up for  Stage III sarcoidosis Hepatosplenomegaly- Hepatic sarcoidosis. Asthmatic bronchitis Chronic cough Ventricular tachycardia s/p AICD. Long QT syndrome Left pleural effusion 03/2015. Improved>> Thoracentesis >>exudative   HPI: [name] is 49-year-old with stage III sarcoidosis, asthmatic bronchitis, chronic cough. She is a former patient of Dr. Wright. She was diagnosed around 2003. There is record of a transbronchial biopsy in 2003 but no granulomas were seen. She had been on and off systemic prednisone. She was admitted in September 2016 with sarcoidosis flare. Bronchial with BAL showed 10K staph aureus which was thought to be a contaminant. She was given Solu-Medrol 60 q6 and then discharged on a slow prednisone taper starting at 60 mg. She is also being followed by cardiology for prolonged QT syndrome, family history of sudden death. Her ICD was changed in November 2016.   She underwent a thoracentesis in November 16 which showed exudative left-sided predominant effusion, and cultures were negative. Repeat CXR showed improvement in her effusion. She she had a liver biopsy early 2017 that showed hepatic sarcoidosis with elevated LFTs. She's been on a prolonged steroid taper with good response and her last LFTs have normalized. We reduced her prednisone to 30 mg in July and then to instructed her to reduce the dose to 20 mg in November of 2017. But for some reason she has actually reduced the dose to 10 mg/day. 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 She is currently on lamotrigine and clonazepam.   01/12/2017: Prednisone reduced to 2.5 mg daily at last visit. She is  tolerating this well. She reports chronic fatigue, dyspnea which is unchanged from before. She's had a second opinion for constant headaches at the [facility] neurolog[facility] and has been started on Topamax. She's had brief episode of blurry vision earlier this month of unclear etiology. This has not recurred since then.                         Interim History: Patient was lost to follow up during Covid-19 Pandemic, reports she stop going anywhere. Recently seen by Dr.Armbruster, GI, who has order CT abdomen. Not currently on prednisone.  Increased shortness of breath over last 3 months and discussed with her PCP. She is working on weight loss.    Outpatient Encounter Medications as of 09/18/2019  Medication Sig  . ACCU-CHEK SOFTCLIX LANCETS lancets Use as instructed  . acetaminophen (TYLENOL) 500 MG tablet Take 1,000 mg by mouth every 6 (six) hours as needed for mild pain or fever. Reported on 07/22/2015  . aspirin EC 81 MG tablet Take 1 tablet (81 mg total) by mouth daily.  . baclofen (LIORESAL) 10 MG tablet Take 1 tablet (10 mg total) by mouth 2 (two) times daily. (Patient taking differently: Take 10 mg by mouth 2 (two) times daily as needed (back pain). )  . Camphor-Menthol-Methyl Sal (SALONPAS) 3.05-23-08 % PTCH Apply 1 patch topically daily as needed (pain).  . cycloSPORINE (RESTASIS) 0.05 % ophthalmic emulsion Place 1 drop daily into both eyes.  . fluconazole (DIFLUCAN) 200 MG tablet Take 1 tablet (200 mg total) by mouth every 3 (three) days.  . fluticasone (FLONASE) 50   MCG/ACT nasal spray Place 2 sprays into both nostrils daily as needed for allergies.   . glucose blood (ACCU-CHEK GUIDE) test strip Use as instructed  . Insulin Degludec (TRESIBA FLEXTOUCH) 200 UNIT/ML SOPN Inject 60 Units into the skin daily at 6 (six) AM.  . Insulin Pen Needle 31G X 8 MM MISC BD UltraFine III Pen Needles. For use with insulin pen device. Inject insulin 6 x daily  . lamoTRIgine (LAMICTAL) 150 MG tablet Take 1 tablet (150  mg total) by mouth 2 (two) times daily.  . Lancet Devices (ACCU-CHEK SOFTCLIX) lancets Use as instructed  . levETIRAcetam (KEPPRA) 500 MG tablet Take 1 tablet (500 mg total) by mouth 2 (two) times daily.  . linaclotide (LINZESS) 290 MCG CAPS capsule Take 1 capsule (290 mcg total) by mouth daily before breakfast.  . metFORMIN (GLUCOPHAGE-XR) 500 MG 24 hr tablet Take 500 mg by mouth daily with breakfast.  . metroNIDAZOLE (METROGEL) 0.75 % vaginal gel Place 1 Applicatorful vaginally 2 (two) times daily as needed.  . naproxen (NAPROSYN) 375 MG tablet Take 1 tablet (375 mg total) by mouth 2 (two) times daily with a meal.  . NOVOLOG FLEXPEN 100 UNIT/ML FlexPen Inject 12u up to 3 times per day with meals as long as sugar is >150  . Olopatadine HCl 0.7 % SOLN Place 1 drop into both eyes daily as needed (eye allergies).   . pantoprazole (PROTONIX) 20 MG tablet Take 1 tablet (20 mg total) by mouth daily as needed.  . potassium chloride SA (K-DUR,KLOR-CON) 20 MEQ tablet Take 1 tablet (20 mEq total) by mouth daily. (Patient taking differently: Take 20 mEq by mouth every other day. )  . Prenatal Vit-Fe Fumarate-FA (PREPLUS) 27-1 MG TABS Take 1 tablet by mouth daily before breakfast.  . propranolol (INDERAL) 80 MG tablet TAKE 1 TABLET BY MOUTH TWICE A DAY (Patient taking differently: Take 80 mg by mouth 2 (two) times daily. )  . beclomethasone (QVAR) 80 MCG/ACT inhaler Inhale 2 puffs into the lungs 2 (two) times daily. (Patient not taking: Reported on 09/18/2019)   No facility-administered encounter medications on file as of 09/18/2019.   Physical Exam: Blood pressure 122/60, pulse 73, height 5' 6" (1.676 m), weight 81 kg (178 lb 9.6 oz), last menstrual period 07/23/2014, SpO2 95 %. Gen:      No acute distress HEENT:  EOMI, sclera anicteric Neck:     No masses; no thyromegaly Lungs:    Clear to auscultation bilaterally; normal respiratory effort CV:         Regular rate and rhythm; no murmurs Abd:      + bowel  sounds; soft, non-tender; no palpable masses, no distension Ext:    No edema; adequate peripheral perfusion Skin:      Warm and dry; no rash Neuro: alert and oriented x 3 Psych: normal mood and affect  Data Reviewed: PFTs (08/29/14) FVC 2.98 (85%) FEV1 2.42 (85%) F/F 81  IMAGING: CT scan (01/21/15) 1. There is a spectrum of findings in the thorax that is most likely related to the patient's history of sarcoidosis. Specifically, there is a perilymphatic distribution of multiple tiny subcentimeter pulmonary nodules, in addition to mediastinal lymphadenopathy. 2. Dilatation of the pulmonic trunk (3.6 cm in diameter), suggestive of pulmonary arterial hypertension. 3. Splenomegaly. 4. Probable hepatomegaly.  CXR (03/03/15) Moderate left pleural effusion. Adjacent left lower lobe atelectasis versus infection. Possible lingular airspace disease as well. New enlargement of the cardiopericardial silhouette, which could relate to cardiomegaly and/or pericardial   effusion.  CXR (06/13/15) Cardiomegaly, mild CHF, improved left effusion   CXR (07/28/16) Mild infrahilar opacity on chronic fibrotic changes suggestive of pneumonia  I have reviewed all images personally.  LABS: Echo 01/28/15 EF 60-65%, PAP 36.  ACE level 01/22/15 - 177.   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  Hepatic Function Latest Ref Rng & Units 06/30/2019 06/29/2019 04/05/2019  Total Protein 6.5 - 8.1 g/dL 7.6 7.3 8.0  Albumin 3.5 - 5.0 g/dL 3.6 3.6 4.2  AST 15 - 41 U/L 30 24 14  ALT 0 - 44 U/L 23 18 15  Alk Phosphatase 38 - 126 U/L 95 82 98  Total Bilirubin 0.3 - 1.2 mg/dL 0.9 0.9 0.8  Bilirubin, Direct 0.0 - 0.3 mg/dL - - 0.1    Assessment:  #1 Sarcoidosis, stage III. No record of granulomas on lung biopsy but liver biopsy does show granulomas. She was lost to  follow up secondary to Covid-19 pandemic. No currently on prednisone. Recent labs in February show a normal calcium and nl LFT's.  We will continue to monitor symptoms and order high resolution CT chest to look for any progression of sarcoidosis. Also will order new PFT's, last PFT's are in 2016.  Referral to opthalmology to monitor for ocular sarcoidosis.    #2 Asthmatic bronchitis. Refill Qvar inhaler   Plan/Recommendations: - High resolution CT chest  - PFT's - Opthalmology referral  - Refill Qvar inhaler     Jeff Steen, MD PGY1   Attending note: I have seen and examined the patient. History, labs and imaging reviewed.  48-year-old with asthma, sarcoidosis, affecting the lungs, lymph nodes, liver. Lost to follow-up since 2018 Currently not on steroids.  Complains of increasing dyspnea which she attributes to weight gain. Has also followed up with Dr. Armbruster and is scheduled for a CT abdomen.  We will reevaluate the lungs with CT and PFTs. Continue annual ophthalmology referral, monitoring LFTs, calcium level.  Praveen Mannam MD [facility] 09/18/2019, 1:53 PM  CC: Bland, Scott, DO  Armbruster, Steven MD 

## 2019-09-18 NOTE — Patient Instructions (Addendum)
Thank you for trusting [place] with your care. We are glad you are doing well. We will order a CT Chest for surveillance of your sarcoidosis. In addition we will refer you for pulmonary function test. Lastly we have made a referral to opthalmology to monitor your vision.   Refill Qvar inhaler

## 2019-09-19 ENCOUNTER — Ambulatory Visit (HOSPITAL_COMMUNITY): Payer: Medicaid Other

## 2019-10-10 ENCOUNTER — Telehealth: Payer: Self-pay

## 2019-10-10 ENCOUNTER — Telehealth: Payer: Self-pay | Admitting: Neurology

## 2019-10-10 NOTE — Telephone Encounter (Signed)
Pt called back no answer, voice mail was full,

## 2019-10-10 NOTE — Telephone Encounter (Signed)
Patietn has Medtronic Visia AF model number [id number], per medtronic website this device is not MRI compatible.

## 2019-10-10 NOTE — Telephone Encounter (Signed)
The pt wants to know if it is okay for her to have an MRI. She states Dr. Lovena Le did not recommend it in the past. I told her the nurse will check and give her a call back.

## 2019-10-10 NOTE — Telephone Encounter (Signed)
Patient called with concerns about an incident yesterday where she did not know where she was and has no memory of it. She said she may have had a stroke earlier this year and was told to call if anything unusual happened.  Patient is requesting a call back from a nurse.

## 2019-10-12 NOTE — Telephone Encounter (Signed)
Pt called back no answer, voice mail was full

## 2019-10-17 NOTE — Telephone Encounter (Signed)
Pls send a letter about trying to reach her, and to call our office with best call back number and day, and follow-up as scheduled next month. Thanks

## 2019-10-17 NOTE — Telephone Encounter (Signed)
Letter printed and mailed.  

## 2019-10-30 ENCOUNTER — Encounter: Payer: Self-pay | Admitting: Family Medicine

## 2019-10-30 ENCOUNTER — Other Ambulatory Visit: Payer: Self-pay

## 2019-10-30 ENCOUNTER — Ambulatory Visit (INDEPENDENT_AMBULATORY_CARE_PROVIDER_SITE_OTHER): Payer: Medicaid Other | Admitting: Family Medicine

## 2019-10-30 VITALS — BP 118/64 | HR 69 | Ht 67.0 in | Wt 181.4 lb

## 2019-10-30 DIAGNOSIS — Z794 Long term (current) use of insulin: Secondary | ICD-10-CM | POA: Diagnosis not present

## 2019-10-30 DIAGNOSIS — E119 Type 2 diabetes mellitus without complications: Secondary | ICD-10-CM

## 2019-10-30 LAB — POCT GLYCOSYLATED HEMOGLOBIN (HGB A1C): HbA1c, POC (controlled diabetic range): 9.7 % — AB (ref 0.0–7.0)

## 2019-10-30 MED ORDER — GABAPENTIN 100 MG PO CAPS: 200.0000 mg | ORAL_CAPSULE | Freq: Three times a day (TID) | ORAL | 2 refills | Status: DC

## 2019-10-30 MED ORDER — ACCU-CHEK GUIDE VI STRP: ORAL_STRIP | 12 refills | Status: DC

## 2019-10-30 MED ORDER — METFORMIN HCL ER 500 MG PO TB24: 500.0000 mg | ORAL_TABLET | Freq: Every day | ORAL | 2 refills | Status: DC

## 2019-10-30 MED ORDER — ACCU-CHEK SOFTCLIX LANCETS MISC: 12 refills | Status: DC

## 2019-10-30 NOTE — Patient Instructions (Addendum)
150-200 2 units novolog 200-250 4 units novolog 250-300 6 units novolog >300     8 units novolog  If glucose <100, call office immediately  We have refilled your gabapentin and glucose checking supplies.  Please make sure you get the CT pictures your other doctors have ordered.

## 2019-10-31 NOTE — Progress Notes (Signed)
    SUBJECTIVE:   CHIEF COMPLAINT / HPI: diabetes f/u  Uncontrolled with a1c 9.7.  Has been taking tresiba 60 qhs and some novolog.  Lost here sliding scale and has been making it up.  If >200 has been taking 2 units novolog TID w/ meals and 1-1.5 units if under.  We discussed it was extremely important that whatever she was doing that I accurately know even if she wasn't taking any and she is clear this has been her insulin usage.  Also experiencing LE neuropathy, no paralysis/foot wounding  PERTINENT  PMH / PSH:   OBJECTIVE:   BP 118/64   Pulse 69   Ht 5\' 7"  (1.702 m)   Wt 181 lb 6.4 oz (82.3 kg)   LMP 07/23/2014   SpO2 97%   BMI 28.41 kg/m   Gen: alert and pleasant, no distress Cardiac: no murmur, RRR Pulm: CTAB, no wheeze/cough MSK: painful neuopathy to bilateral LE, no skin changes/weakness/paresthesia  ASSESSMENT/PLAN:   Diabetes mellitus, type 2 (HCC) Chronic 60 tresiba daily, metformin 500 xr (can't tolerate more)  Was only taking 2u novolog with meals if >200 so will start sliding scale and f/u in 1 wk Starting novolog sliding scale 150-200 2 units novolog 200-250 4 units novolog 250-300 6 units novolog >300     8 units novolog     Sherene Sires, DO [place]

## 2019-10-31 NOTE — Assessment & Plan Note (Signed)
Chronic 60 tresiba daily, metformin 500 xr (can't tolerate more)  Was only taking 2u novolog with meals if >200 so will start sliding scale and f/u in 1 wk Starting novolog sliding scale 150-200 2 units novolog 200-250 4 units novolog 250-300 6 units novolog >300     8 units novolog

## 2019-11-02 ENCOUNTER — Telehealth: Payer: Self-pay

## 2019-11-02 DIAGNOSIS — E119 Type 2 diabetes mellitus without complications: Secondary | ICD-10-CM

## 2019-11-02 MED ORDER — NOVOLOG FLEXPEN 100 UNIT/ML ~~LOC~~ SOPN: PEN_INJECTOR | SUBCUTANEOUS | 3 refills | Status: DC

## 2019-11-02 NOTE — Telephone Encounter (Signed)
Patient calls nurse line requesting novolog refill. Patient was seen in the office on 6/14. Per OV note patient is to be using sliding scale for novolog dosage, however, patient states that pharmacy never received rx.   Forwarding to PCP for clarification.   Talbot Grumbling, RN

## 2019-11-06 ENCOUNTER — Telehealth: Payer: Self-pay

## 2019-11-06 NOTE — Telephone Encounter (Signed)
The pt states her device was going a little haywire. I had her send a transmission and let her speak with device nurse Jenny Reichmann, rn.

## 2019-11-06 NOTE — Telephone Encounter (Signed)
Patient reports she was awakened by her ICD. Transmission was sent by patient that shows no episodes or arrhythmias. And presenting EGM shows VS in 70s. Patient reassured that she was not treated by device .

## 2019-11-07 ENCOUNTER — Ambulatory Visit (INDEPENDENT_AMBULATORY_CARE_PROVIDER_SITE_OTHER): Payer: Medicaid Other | Admitting: Family Medicine

## 2019-11-07 ENCOUNTER — Encounter: Payer: Self-pay | Admitting: Family Medicine

## 2019-11-07 ENCOUNTER — Other Ambulatory Visit: Payer: Self-pay

## 2019-11-07 DIAGNOSIS — E119 Type 2 diabetes mellitus without complications: Secondary | ICD-10-CM | POA: Diagnosis not present

## 2019-11-07 DIAGNOSIS — Z794 Long term (current) use of insulin: Secondary | ICD-10-CM

## 2019-11-07 LAB — POCT UA - MICROALBUMIN
Creatinine, POC: 50 mg/dL
Microalbumin Ur, POC: 30 mg/L

## 2019-11-07 MED ORDER — NOVOLOG FLEXPEN 100 UNIT/ML ~~LOC~~ SOPN: PEN_INJECTOR | SUBCUTANEOUS | 3 refills | Status: DC

## 2019-11-07 NOTE — Progress Notes (Signed)
° ° °  SUBJECTIVE:   CHIEF COMPLAINT / HPI: Diabetes follow-up  Patient had been scheduled to start a sliding scale NovoLog because she had been erroneously using a low fixed scale.  Unfortunately I had a computer error and did not order this to be sent to the pharmacy and instead ordered as a "no print ".  Apologize the patient we are refilling this today.  As she is here were going to go ahead and order a urine microalbumin as part of her general health care maintenance  PERTINENT  PMH / PSH:    OBJECTIVE:   BP 128/80    Pulse 81    Ht 5\' 7"  (1.702 m)    Wt 184 lb 12.8 oz (83.8 kg)    LMP 07/23/2014    SpO2 98%    BMI 28.94 kg/m   General: Alert pleasant no distress  ASSESSMENT/PLAN:   Diabetes mellitus, type 2 ([place]) I had a computer error and did not send the prescription to the pharmacy, I have now resolved this and patient has a urine microalbumin within good range.  Plan follow-up in a week to discuss further titration of her dose to get her goal glucoses in the mid 100s as opposed to her current mid 200s.     Sherene Sires, [place]

## 2019-11-07 NOTE — Patient Instructions (Addendum)
Today we ordered a urine test to check your kidney function because of your diabetes.  I also refilled your NovoLog pen, please continue to use the sliding scale that I gave you last time.  Call back in the next week or so to go over that with either Dr. Valentina Lucks or pharmacist or with one of the other physicians here.  They may want to redose your sliding scale to try and get you living in the mid 100s for your blood sugars instead of the mid 200s.  I know you told us he already taken the tetanus shot as well as the fact that you are managing your Pap smears with her gynecologist.  Let us know if you need anything in the future  Dr. Criss Rosales

## 2019-11-08 NOTE — Assessment & Plan Note (Signed)
I had a computer error and did not send the prescription to the pharmacy, I have now resolved this and patient has a urine microalbumin within good range.  Plan follow-up in a week to discuss further titration of her dose to get her goal glucoses in the mid 100s as opposed to her current mid 200s.

## 2019-11-13 ENCOUNTER — Ambulatory Visit (INDEPENDENT_AMBULATORY_CARE_PROVIDER_SITE_OTHER): Payer: Medicaid Other | Admitting: *Deleted

## 2019-11-13 DIAGNOSIS — I4581 Long QT syndrome: Secondary | ICD-10-CM

## 2019-11-14 LAB — CUP PACEART REMOTE DEVICE CHECK
Battery Remaining Longevity: 93 mo
Battery Voltage: 3.01 V
Brady Statistic RV Percent Paced: 0.01 %
Date Time Interrogation Session: 20210628033425
HighPow Impedance: 48 Ohm
HighPow Impedance: 73 Ohm
Implantable Lead Implant Date: 20080828
Implantable Lead Location: 753860
Implantable Lead Model: 6947
Implantable Pulse Generator Implant Date: 20161117
Lead Channel Impedance Value: 342 Ohm
Lead Channel Impedance Value: 399 Ohm
Lead Channel Pacing Threshold Amplitude: 1.375 V
Lead Channel Pacing Threshold Pulse Width: 0.4 ms
Lead Channel Sensing Intrinsic Amplitude: 4.25 mV
Lead Channel Sensing Intrinsic Amplitude: 4.25 mV
Lead Channel Setting Pacing Amplitude: 3.25 V
Lead Channel Setting Pacing Pulse Width: 0.4 ms
Lead Channel Setting Sensing Sensitivity: 0.3 mV

## 2019-11-15 NOTE — Progress Notes (Signed)
Remote ICD transmission.   

## 2019-11-22 ENCOUNTER — Telehealth (INDEPENDENT_AMBULATORY_CARE_PROVIDER_SITE_OTHER): Payer: Medicaid Other | Admitting: Neurology

## 2019-11-22 ENCOUNTER — Encounter: Payer: Self-pay | Admitting: Neurology

## 2019-11-22 ENCOUNTER — Other Ambulatory Visit: Payer: Self-pay

## 2019-11-22 VITALS — Ht 67.0 in | Wt 184.0 lb

## 2019-11-22 DIAGNOSIS — G40B09 Juvenile myoclonic epilepsy, not intractable, without status epilepticus: Secondary | ICD-10-CM | POA: Diagnosis not present

## 2019-11-22 DIAGNOSIS — G43009 Migraine without aura, not intractable, without status migrainosus: Secondary | ICD-10-CM

## 2019-11-22 MED ORDER — LEVETIRACETAM 500 MG PO TABS: 500.0000 mg | ORAL_TABLET | Freq: Two times a day (BID) | ORAL | 3 refills | Status: DC

## 2019-11-22 MED ORDER — LAMOTRIGINE 150 MG PO TABS: 150.0000 mg | ORAL_TABLET | Freq: Two times a day (BID) | ORAL | 3 refills | Status: DC

## 2019-11-22 NOTE — Progress Notes (Signed)
Virtual Visit via Telephone Note The purpose of this virtual visit is to provide medical care while limiting exposure to the novel coronavirus.    Consent was obtained for video visit:  Yes.   Answered questions that patient had about telehealth interaction:  Yes.   I discussed the limitations, risks, security and privacy concerns of performing an evaluation and management service by telemedicine. I also discussed with the patient that there may be a patient responsible charge related to this service. The patient expressed understanding and agreed to proceed.  Pt location: Home Physician Location: office Name of referring provider:  Sherene Sires, DO I connected with Gerald Honea Lartigue at patients initiation/request on 11/22/2019 at  4:00 PM EDT by video enabled telemedicine application and verified that I am speaking with the correct person using two identifiers. Pt MRN:  [id number] Pt DOB:  12-Feb-1971 Video Participants:  Maida Sale Dogan   History of Present Illness:  The patient had a virtual video visit on 11/22/2019. Unable to connect via video due to technical difficulties. 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 She is taking Lamotrigine 150mg  BID and Levetiracetam 500mg  BID. She called our office last 10/10/19 to report an incident where she did not know where she was and had no memory of it. She states she would read the bible in the morning and have a hard time comprehending what she is reading. She would read the same scriptures over and over. Sometimes when she wakes up, it takes a good time to figure out where she is. When she is talking, she forgets what she is saying minute to minute. Her daughter has reported times where she is unresponsive with a blank stare, lasting 5 minutes or less. She had 3 of these in June. She has noticed a hard time getting words out, stuttering sometimes, no focal weakness. Her balance is off, she had a fall the other  week. She denies missing medications. She does not get much sleep at night. She is on Gabapentin 200mg  TID for neuropathy.   History on Initial Assessment 11/05/2016: This is a 49 year old ambidextrous left-hand dominant woman with a history of sarcoidosis (pulmonary and hepatic), diabetes, ventricular tachycardia s/p AICD placement, long Q-T syndrome, juvenile myoclonic epilepsy, with seizures and headaches. She had been seeing neurologist Dr. Felecia Shelling and states that she grew up never treated for seizures but from time to time was told she had symptoms that sounded like seizure. She started seeing Neurology in her late 37s when she had episodes where she would briefly blackout and become confused, not knowing what was going on. She would have quick body jerks but no convulsive activity. She was taking clonazepam at that time but was not on medication when she started seeing Dr. Felecia Shelling in October 2017. An EEG done 03/20/2016 showed several 3-6 second bursts of generalized 3 Hz polyspike wave activity. There were also some temporal sharp waves seen. EEG reported as consistent with juvenile myoclonic epilepsy. She was started on Lamotrigine in November 2017, increased to 100mg  BID. Clonazepam 0.5mg  BID was added in November 2017, but per records it is prn for anxiety. Keppra 500mg  BID was added in March 2018 for seizure and headache prophylaxis. She reports last blackout episode was in March 2018. She continues to have myoclonic jerks a few times a week affecting her arms. They can occur anytime during the day. She denies any convulsions but has woken up with her tongue  bitten and her bed wet. Last episode of waking up with tongue bite was last week. She reports a history of bad headaches since elementary school, but now she feels her head is full with constant 4/10 pain waxing and waning in severity to 10/10 3-4 times a month. Pain is usually on the left parietal or vertex region, sometimes with pressure or throbbing  on the left side. She has nausea and vomiting, occasional photo and phonophobia. She takes Tylenol on occasion, a small bottle lasts her a month, but recently she has not been taking anything. Occipital nerve block did not help in the past. She usually gets 3-4 hours of sleep due to chronic neck and back pain. She reports her mind is always wandering. She has a history of fibromyalgia and her body is always sore. She has diabetic neuropathy, and reports that there is always some tingling or numbness somewhere in her arms and legs. She has occasional constipation. She reports a fall last Wednesday. She denies any significant head injuries. She has been taking gabapentin for neuropathy for the past 4-5 years. She has occasional anxiety. She feels like her left leg drags more. She had seen ophthalmologist Dr. Tama High and was told there was "increased pressure," records will be requested for review. She denies any olfactory/gustatory hallucinations, deja vu, rising epigastric sensation. Her mother had migraines, maternal grandfather had migraines.   Epilepsy Risk Factors:  There is a strong family history of seizures in her maternal grandfather, maternal uncles. She had a normal birth and early development.  There is no history of febrile convulsions, CNS infections such as meningitis/encephalitis, significant traumatic brain injury, neurosurgical procedures.   I personally reviewed head CT without contrast done 11/2014 which did not show any acute changes.    Current Outpatient Medications on File Prior to Visit  Medication Sig Dispense Refill  . Accu-Chek Softclix Lancets lancets Use as instructed 100 each 12  . acetaminophen (TYLENOL) 500 MG tablet Take 1,000 mg by mouth every 6 (six) hours as needed for mild pain or fever. Reported on 07/22/2015    . aspirin EC 81 MG tablet Take 1 tablet (81 mg total) by mouth daily. 30 tablet 11  . baclofen (LIORESAL) 10 MG tablet Take 1 tablet (10 mg total) by  mouth 2 (two) times daily. (Patient taking differently: Take 10 mg by mouth 2 (two) times daily as needed (back pain). ) 60 each 0  . beclomethasone (QVAR) 80 MCG/ACT inhaler Inhale 2 puffs into the lungs 2 (two) times daily. 1 Inhaler 5  . Camphor-Menthol-Methyl Sal (SALONPAS) 3.05-23-08 % PTCH Apply 1 patch topically daily as needed (pain).    . cycloSPORINE (RESTASIS) 0.05 % ophthalmic emulsion Place 1 drop daily into both eyes.    . fluticasone (FLONASE) 50 MCG/ACT nasal spray Place 2 sprays into both nostrils daily as needed for allergies.     Marland Kitchen gabapentin (NEURONTIN) 100 MG capsule Take 2 capsules (200 mg total) by mouth 3 (three) times daily. 180 capsule 2  . glucose blood (ACCU-CHEK GUIDE) test strip Use as instructed 100 each 12  . Insulin Degludec (TRESIBA FLEXTOUCH) 200 UNIT/ML SOPN Inject 60 Units into the skin daily at 6 (six) AM. 3 pen 3  . Insulin Pen Needle 31G X 8 MM MISC BD UltraFine III Pen Needles. For use with insulin pen device. Inject insulin 6 x daily 200 each 3  . lamoTRIgine (LAMICTAL) 150 MG tablet Take 1 tablet (150 mg total) by mouth 2 (two)  times daily. 180 tablet 3  . Lancet Devices (ACCU-CHEK SOFTCLIX) lancets Use as instructed 1 each 0  . levETIRAcetam (KEPPRA) 500 MG tablet Take 1 tablet (500 mg total) by mouth 2 (two) times daily. 180 tablet 3  . linaclotide (LINZESS) 290 MCG CAPS capsule Take 1 capsule (290 mcg total) by mouth daily before breakfast. 90 capsule 1  . metFORMIN (GLUCOPHAGE-XR) 500 MG 24 hr tablet Take 1 tablet (500 mg total) by mouth daily with breakfast. 30 tablet 2  . metroNIDAZOLE (METROGEL) 0.75 % vaginal gel Place 1 Applicatorful vaginally 2 (two) times daily as needed.    . naproxen (NAPROSYN) 375 MG tablet Take 1 tablet (375 mg total) by mouth 2 (two) times daily with a meal. 60 tablet 3  . NOVOLOG FLEXPEN 100 UNIT/ML FlexPen 0-5 units three times per day with meals (according to sliding scale given by physician) 15 mL 3  . Olopatadine HCl 0.7  % SOLN Place 1 drop into both eyes daily as needed (eye allergies).     . pantoprazole (PROTONIX) 20 MG tablet Take 1 tablet (20 mg total) by mouth daily as needed. 90 tablet 3  . potassium chloride SA (K-DUR,KLOR-CON) 20 MEQ tablet Take 1 tablet (20 mEq total) by mouth daily. (Patient taking differently: Take 20 mEq by mouth every other day. ) 30 tablet 2  . Prenatal Vit-Fe Fumarate-FA (PREPLUS) 27-1 MG TABS Take 1 tablet by mouth daily before breakfast. 30 tablet 11  . propranolol (INDERAL) 80 MG tablet TAKE 1 TABLET BY MOUTH TWICE A DAY (Patient taking differently: Take 80 mg by mouth 2 (two) times daily. ) 30 tablet 11  . fluconazole (DIFLUCAN) 200 MG tablet Take 1 tablet (200 mg total) by mouth every 3 (three) days. (Patient not taking: Reported on 11/22/2019) 3 tablet 2   No current facility-administered medications on file prior to visit.     Observations/Objective:   Vitals:   11/22/19 1542  Weight: 184 lb (83.5 kg)  Height: 5\' 7"  (1.702 m)   Exam limited due to nature of phone visit. Patient is awake, alert, able to answer questions without dysarthria or confusion.   Assessment and Plan:   This is a 49 yo ambidextrous left-hand dominant woman with a history of sarcoidosis (pulmonary and hepatic), diabetes, ventricular tachycardia s/p AICD placement, long Q-T syndrome, with juvenile myoclonic epilepsy and migraines. Prior EEG at Dr. Garth Bigness office reported generalized 3 Hz polyspike wave discharges consistent with a generalized epilepsy. She is on Lamotrigine 150mg  BID and Levetiracetam 500mg  BID. Since her last visit, she reports episodes of loss of time, memory loss, and staring/unresponsiveness. She also reports confusion in the mornings. A 1-hour EEG will be ordered, if unremarkable, we will do a 48-hour EEG to further classify symptoms. She reports poor sleep and will try melatonin first. Continue current AEDs, we may increase dose depending on EEG results. She will discuss switching  to an MRI-compatible ICD with her cardiologist. We discussed [place] driving laws to stop driving after an episode of loss of awareness until 6 months event-free. Follow-up in 4 months, she knows to call for any changes.    Follow Up Instructions:    -I discussed the assessment and treatment plan with the patient. The patient was provided an opportunity to ask questions and all were answered. The patient agreed with the plan and demonstrated an understanding of the instructions.   The patient was advised to call back or seek an in-person evaluation if the symptoms worsen or  if the condition fails to improve as anticipated.  Total Time spent in visit with the patient was:  11:33 minutes, of which 100% of the time was spent in counseling and/or coordinating care on the above.   Pt understands and agrees with the plan of care outlined.    Cameron Sprang, MD

## 2019-11-24 ENCOUNTER — Other Ambulatory Visit: Payer: Self-pay | Admitting: Internal Medicine

## 2019-11-29 ENCOUNTER — Ambulatory Visit: Payer: Medicaid Other | Admitting: Internal Medicine

## 2019-11-29 ENCOUNTER — Encounter: Payer: Self-pay | Admitting: Internal Medicine

## 2019-11-29 ENCOUNTER — Other Ambulatory Visit: Payer: Self-pay

## 2019-11-29 VITALS — BP 126/74 | HR 69 | Ht 67.0 in | Wt 188.4 lb

## 2019-11-29 DIAGNOSIS — I471 Supraventricular tachycardia, unspecified: Secondary | ICD-10-CM

## 2019-11-29 DIAGNOSIS — D860 Sarcoidosis of lung: Secondary | ICD-10-CM

## 2019-11-29 DIAGNOSIS — Z95810 Presence of automatic (implantable) cardiac defibrillator: Secondary | ICD-10-CM | POA: Diagnosis not present

## 2019-11-29 DIAGNOSIS — I4581 Long QT syndrome: Secondary | ICD-10-CM

## 2019-11-29 NOTE — Patient Instructions (Signed)
Medication Instructions:  Your physician recommends that you continue on your current medications as directed. Please refer to the Current Medication list given to you today.  *If you need a refill on your cardiac medications before your next appointment, please call your pharmacy*  Lab Work: None ordered.  If you have labs (blood work) drawn today and your tests are completely normal, you will receive your results only by: Marland Kitchen MyChart Message (if you have MyChart) OR . A paper copy in the mail If you have any lab test that is abnormal or we need to change your treatment, we will call you to review the results.  Testing/Procedures: None ordered.  Follow-Up: At [facility], you and your health needs are our priority.  As part of our continuing mission to provide you with exceptional heart care, we have created designated Provider Care Teams.  These Care Teams include your primary Cardiologist (physician) and Advanced Practice Providers (APPs -  Physician Assistants and Nurse Practitioners) who all work together to provide you with the care you need, when you need it.  We recommend signing up for the patient portal called "MyChart".  Sign up information is provided on this After Visit Summary.  MyChart is used to connect with patients for Virtual Visits (Telemedicine).  Patients are able to view lab/test results, encounter notes, upcoming appointments, etc.  Non-urgent messages can be sent to your provider as well.   To learn more about what you can do with MyChart, go to [email or web address].    Your next appointment:   Your physician wants you to follow-up in: 1 year with Dr. Lovena Le. You will receive a reminder letter in the mail two months in advance. If you don't receive a letter, please call our office to schedule the follow-up appointment.  Remote monitoring is used to monitor your ICD from home. This monitoring reduces the number of office visits required to check your device to one  time per year. It allows [place] to keep an eye on the functioning of your device to ensure it is working properly. You are scheduled for a device check from home on 02/12/2020. You may send your transmission at any time that day. If you have a wireless device, the transmission will be sent automatically. After your physician reviews your transmission, you will receive a postcard with your next transmission date.  Other Instructions:

## 2019-11-29 NOTE — Progress Notes (Signed)
HPI [name] returns today for followup. 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 She has sarcoidosis. She lost over 30 lbs a couple of years ago but has gained the weight back. She denies chest pain or sob. She has not been vaccinated.  Allergies  Allergen Reactions   Contrast Media [Iodinated Diagnostic Agents] Anaphylaxis and Shortness Of Breath    Needed to be defibrillated and patient was pre-medicated with radiology 13 hour premeds.    Iohexol Anaphylaxis     Code: RASH, Desc: PT STATES SHE IS ALLERGIC TO IV CONTRAST 05/28/06/RM, Onset Date: [id number]    Augmentin [Amoxicillin-Pot Clavulanate] Other (See Comments)    "stomach hurt"   Codeine Nausea And Vomiting    jittery   Demerol [Meperidine] Other (See Comments)    dysphoria   Morphine And Related Nausea And Vomiting   Lantus [Insulin Glargine]     Patient reports itching     Current Outpatient Medications  Medication Sig Dispense Refill   Accu-Chek Softclix Lancets lancets Use as instructed 100 each 12   acetaminophen (TYLENOL) 500 MG tablet Take 1,000 mg by mouth every 6 (six) hours as needed for mild pain or fever. Reported on 07/22/2015     aspirin EC 81 MG tablet Take 1 tablet (81 mg total) by mouth daily. 30 tablet 11   baclofen (LIORESAL) 10 MG tablet Take 1 tablet (10 mg total) by mouth 2 (two) times daily. 60 each 0   beclomethasone (QVAR) 80 MCG/ACT inhaler Inhale 2 puffs into the lungs 2 (two) times daily. 1 Inhaler 5   Camphor-Menthol-Methyl Sal (SALONPAS) 3.05-23-08 % PTCH Apply 1 patch topically daily as needed (pain).     cycloSPORINE (RESTASIS) 0.05 % ophthalmic emulsion Place 1 drop daily into both eyes.     fluconazole (DIFLUCAN) 200 MG tablet Take 1 tablet (200 mg total) by mouth every 3 (three) days. 3 tablet 2   fluticasone (FLONASE) 50 MCG/ACT nasal spray Place 2 sprays into both nostrils daily as needed for allergies.      gabapentin (NEURONTIN)  100 MG capsule Take 2 capsules (200 mg total) by mouth 3 (three) times daily. 180 capsule 2   glucose blood (ACCU-CHEK GUIDE) test strip Use as instructed 100 each 12   Insulin Degludec (TRESIBA FLEXTOUCH) 200 UNIT/ML SOPN Inject 60 Units into the skin daily at 6 (six) AM. 3 pen 3   Insulin Pen Needle 31G X 8 MM MISC BD UltraFine III Pen Needles. For use with insulin pen device. Inject insulin 6 x daily 200 each 3   lamoTRIgine (LAMICTAL) 150 MG tablet Take 1 tablet (150 mg total) by mouth 2 (two) times daily. 180 tablet 3   Lancet Devices (ACCU-CHEK SOFTCLIX) lancets Use as instructed 1 each 0   levETIRAcetam (KEPPRA) 500 MG tablet Take 1 tablet (500 mg total) by mouth 2 (two) times daily. 180 tablet 3   linaclotide (LINZESS) 290 MCG CAPS capsule Take 1 capsule (290 mcg total) by mouth daily before breakfast. 90 capsule 1   metFORMIN (GLUCOPHAGE-XR) 500 MG 24 hr tablet Take 1 tablet (500 mg total) by mouth daily with breakfast. 30 tablet 2   metroNIDAZOLE (METROGEL) 0.75 % vaginal gel Place 1 Applicatorful vaginally 2 (two) times daily as needed.     naproxen (NAPROSYN) 375 MG tablet Take 1 tablet (375 mg total) by mouth 2 (two) times daily with a meal. 60 tablet 3  NOVOLOG FLEXPEN 100 UNIT/ML FlexPen 0-5 units three times per day with meals (according to sliding scale given by physician) 15 mL 3   Olopatadine HCl 0.7 % SOLN Place 1 drop into both eyes daily as needed (eye allergies).      pantoprazole (PROTONIX) 20 MG tablet Take 1 tablet (20 mg total) by mouth daily as needed. 90 tablet 3   potassium chloride SA (K-DUR,KLOR-CON) 20 MEQ tablet Take 1 tablet (20 mEq total) by mouth daily. 30 tablet 2   Prenatal Vit-Fe Fumarate-FA (PREPLUS) 27-1 MG TABS Take 1 tablet by mouth daily before breakfast. 30 tablet 11   propranolol (INDERAL) 80 MG tablet Take 1 tablet (80 mg total) by mouth 2 (two) times daily. Please keep upcoming appt with Dr. Lovena Le in July before anymore refills. Final  Attempt 60 tablet 0   No current facility-administered medications for this visit.     Past Medical History:  Diagnosis Date   Abdominal pain, periumbilic    Allergic rhinitis    Allergy    Anemia    Anxiety    Arthritis    Asthma    Automatic implantable cardioverter-defibrillator in situ 2006, replaced 2008   MEDTRONIC   Bronchitis    Cancer ([place])    Nodules in lungs pt believes were cancerous pt unsure   COPD (chronic obstructive pulmonary disease) ([place])    DM (diabetes mellitus) ([place]) 1997/1998   type 1, initially gestational but soon after child birth developed IDDM   Esophageal stricture 2005/2009   esophageal strictures dilated 2005, 2009   Eye hemorrhage    bilateral   Fibroid, uterine    Fibromyalgia    GERD (gastroesophageal reflux disease) 2005   History of blood transfusion years ago   Hypertension    Internal hemorrhoids 2010   2011 band ligation of int rrhoids.   Long Q-T syndrome    Neuropathy in diabetes ([place])    Palpitation    Sarcoidosis    Seizure disorder ([place])    none recently   Stroke ([place])    QUESTIONABLE TIA     ROS:   All systems reviewed and negative except as noted in the HPI.   Past Surgical History:  Procedure Laterality Date   AUTOMATIC IMPLANTABLE CARDIAC DEFIBRILLATOR SITU  2006,    ICD-Medtronic   Remote - Yes    CERVIX REMOVAL     DILITATION & CURRETTAGE/HYSTROSCOPY WITH HYDROTHERMAL ABLATION N/A 06/07/2013   Procedure: DILATATION & CURETTAGE/HYSTEROSCOPY WITH attempted HYDROTHERMAL ABLATION;  Surgeon: Frederico Hamman, MD;  Location: [facility];  Service: Gynecology;  Laterality: N/A;   EP IMPLANTABLE DEVICE N/A 04/04/2015   Procedure:  ICD Generator Changeout;  Surgeon: Evans Lance, MD;  Location: [facility];  Service: Cardiovascular;  Laterality: N/A;   ESOPHAGOGASTRODUODENOSCOPY (EGD) WITH PROPOFOL N/A 10/24/2014   Procedure: ESOPHAGOGASTRODUODENOSCOPY (EGD) WITH PROPOFOL;  Surgeon:  Inda Castle, MD;  Location: [facility];  Service: Endoscopy;  Laterality: N/A;   EXPLORATORY LAPAROTOMY  11/06/14   evacuation of post TAH/BSO hematoma   HAND SURGERY Right    x 2   HEMORRHOID BANDING  03/2010   SALPINGECTOMY     SVT ablation     TOTAL ABDOMINAL HYSTERECTOMY W/ BILATERAL SALPINGOOPHORECTOMY  11/06/14   at [facility]. for menorrhagia, pelvic pain and enlarging uterine fibroids   TUBAL LIGATION     VIDEO BRONCHOSCOPY Bilateral 01/24/2015   Procedure: VIDEO BRONCHOSCOPY WITHOUT FLUORO;  Surgeon: Marshell Garfinkel, MD;  Location: [facility];  Service:  Cardiopulmonary;  Laterality: Bilateral;     Family History  Problem Relation Age of Onset   Heart attack Mother    Lung cancer Maternal Uncle        x2   Breast cancer Maternal Aunt        x2   Stomach cancer Paternal Uncle    Stomach cancer Paternal Grandfather    Stomach cancer Paternal Grandmother    Diabetes Father    Positive PPD/TB Exposure Father    Glaucoma Father    Esophageal cancer Paternal Aunt    Colon cancer Maternal Grandfather      Social History   Socioeconomic History   Marital status: Single    Spouse name: Not on file   Number of children: 3   Years of education: Not on file   Highest education level: Not on file  Occupational History   Occupation: disabled  Tobacco Use   Smoking status: Never Smoker   Smokeless tobacco: Never Used  Scientific laboratory technician Use: Never used  Substance and Sexual Activity   Alcohol use: Yes    Alcohol/week: 0.0 standard drinks    Comment: None since 2015-was occasional   Drug use: Never   Sexual activity: Yes    Birth control/protection: Condom  Other Topics Concern   Not on file  Social History Narrative   Right handed    Social Determinants of Health   Financial Resource Strain:    Difficulty of Paying Living Expenses:   Food Insecurity:    Worried About Charity fundraiser in the Last Year:    Arboriculturist in  the Last Year:   Transportation Needs:    Film/video editor (Medical):    Lack of Transportation (Non-Medical):   Physical Activity:    Days of Exercise per Week:    Minutes of Exercise per Session:   Stress:    Feeling of Stress :   Social Connections:    Frequency of Communication with Friends and Family:    Frequency of Social Gatherings with Friends and Family:    Attends Religious Services:    Active Member of Clubs or Organizations:    Attends Music therapist:    Marital Status:   Intimate Partner Violence:    Fear of Current or Ex-Partner:    Emotionally Abused:    Physically Abused:    Sexually Abused:      BP 126/74    Pulse 69    Ht 5\' 7"  (1.702 m)    Wt 188 lb 6.4 oz (85.5 kg)    LMP 07/23/2014    SpO2 97%    BMI 29.51 kg/m   Physical Exam:  Well appearing 49 yo woman, NAD HEENT: Unremarkable Neck:  6 cm JVD, no thyromegally Lymphatics:  No adenopathy Back:  No CVA tenderness Lungs:  Clear with no wheezes HEART:  Regular rate rhythm, no murmurs, no rubs, no clicks Abd:  soft, positive bowel sounds, no organomegally, no rebound, no guarding Ext:  2 plus pulses, no edema, no cyanosis, no clubbing Skin:  No rashes no nodules Neuro:  CN II through XII intact, motor grossly intact   DEVICE  Normal device function.  See PaceArt for details.   Assess/Plan: 1. 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 2. Long QT - she will continue her propranolol. Her med list suggest that is taking diflucan once every 3  days. This is probably a small issue but she will need to stop if she has more ventricular arrhythmias. 3. Sarcoidosis - she does not have any evidence of cardiac  involvement currently. 4. ICD - her medtronic VVIR ICD is working normally. No change.   Salome Spotted.

## 2019-12-04 ENCOUNTER — Ambulatory Visit (INDEPENDENT_AMBULATORY_CARE_PROVIDER_SITE_OTHER): Payer: Medicaid Other | Admitting: Neurology

## 2019-12-04 ENCOUNTER — Other Ambulatory Visit: Payer: Self-pay

## 2019-12-04 DIAGNOSIS — G40B09 Juvenile myoclonic epilepsy, not intractable, without status epilepticus: Secondary | ICD-10-CM

## 2019-12-04 DIAGNOSIS — G43009 Migraine without aura, not intractable, without status migrainosus: Secondary | ICD-10-CM

## 2019-12-11 ENCOUNTER — Other Ambulatory Visit (HOSPITAL_COMMUNITY): Payer: Medicaid Other

## 2019-12-11 ENCOUNTER — Other Ambulatory Visit: Payer: Medicaid Other

## 2019-12-12 NOTE — Procedures (Signed)
ELECTROENCEPHALOGRAM REPORT  Date of Study: 12/04/2019  Patient's Name: [name] MRN: [id number] Date of Birth: [birth year]  Referring Provider: Dr. Ellouise Newer  Clinical History: This is a 49 year old woman with a history of primary generalized epilepsy with prior EEG reporting 3 Hz polyspike and wave discharges, with recurrent episodes of loss of time, staring, memory loss. EEG for classification.  Medications: LAMICTAL 150 MG tablet KEPPRA 500 MG tablet NEURONTIN 100 MG capsule TYLENOL 500 MG tablet aspirin EC 81 MG tablet LIORESAL 10 MG tablet QVAR 80 MCG/ACT inhaler SALONPAS 3.05-23-08 % PTCH RESTASIS 0.05 % ophthalmic emulsion FLONASE 50 MCG/ACT nasal spray TRESIBA FLEXTOUCH 200 UNIT/ML SOPN LINZESS 290 MCG CAPS capsule GLUCOPHAGE-XR 500 MG 24 hr tablet METROGEL 0.75  NAPROSYN 375 MG tablet NOVOLOG FLEXPEN 100 UNIT/ML FlexPen Olopatadine HCl 0.7 % SOLN PROTONIX 20 MG tablet K-DUR,KLOR-CON 20 MEQ tablet DELTASONE 5 MG tablet PRENATAL 1 + 1 27-1 MG TABS tablet INDERAL 80 MG tablet DIFLUCAN 200 MG tablet   Technical Summary: A multichannel digital 1-hour EEG recording measured by the international 10-20 system with electrodes applied with paste and impedances below 5000 ohms performed in our laboratory with EKG monitoring in an awake and asleep patient.  Hyperventilation was not performed. Photic stimulation was performed.  The digital EEG was referentially recorded, reformatted, and digitally filtered in a variety of bipolar and referential montages for optimal display.    Description: The patient is awake and asleep during the recording.  During maximal wakefulness, there is a symmetric, medium voltage 10.5 Hz posterior dominant rhythm that attenuates with eye opening.  There is occasional focal 4-5 Hz theta slowing over the left temporal region. During drowsiness and sleep, there is an increase in theta slowing of the background with shifting asymmetry over the  bilateral temporal regions, at times sharply contoured over the left temporal region.  Vertex waves and symmetric sleep spindles were seen.  Photic stimulation did not elicit any abnormalities.  There is a single sharp wave with aftergoing slow wave seen on the left frontotemporal region. There were no electrographic seizures seen.    EKG lead was unremarkable.  Impression: This 1-hour awake and asleep EEG is abnormal due to the presence of: 1. Occasional focal slowing over the left temporal region 2. A single sharp wave over the left frontotemporal region  Clinical Correlation of the above findings indicates focal cerebral dysfunction over the left temporal region with tendency for seizures to arise from the left frontotemporal region. If further clinical questions remain, prolonged EEG may be helpful.  Clinical correlation is advised.   Ellouise Newer, M.D.

## 2019-12-13 ENCOUNTER — Telehealth: Payer: Self-pay

## 2019-12-13 ENCOUNTER — Telehealth: Payer: Self-pay | Admitting: Pulmonary Disease

## 2019-12-13 NOTE — Telephone Encounter (Signed)
Left a message for pt to call back

## 2019-12-13 NOTE — Telephone Encounter (Signed)
lmam for the patient to call back to resc

## 2019-12-13 NOTE — Telephone Encounter (Signed)
-----   Message from Cameron Sprang, MD sent at 12/12/2019  4:10 PM EDT ----- Pls let her know that I would like to go ahead with the 48-hour EEG so we can see what the brain waves show when she has loss of time,speech difficulties, or staring off. Thanks

## 2019-12-14 ENCOUNTER — Telehealth: Payer: Self-pay

## 2019-12-14 NOTE — Telephone Encounter (Signed)
Pt called to go over EEG results no answer voice mail left for pt to call back

## 2019-12-14 NOTE — Telephone Encounter (Signed)
-----   Message from Cameron Sprang, MD sent at 12/12/2019  4:10 PM EDT ----- Pls let her know that I would like to go ahead with the 48-hour EEG so we can see what the brain waves show when she has loss of time,speech difficulties, or staring off. Thanks

## 2019-12-15 ENCOUNTER — Telehealth: Payer: Self-pay

## 2019-12-15 DIAGNOSIS — G43009 Migraine without aura, not intractable, without status migrainosus: Secondary | ICD-10-CM

## 2019-12-15 DIAGNOSIS — G40B09 Juvenile myoclonic epilepsy, not intractable, without status epilepticus: Secondary | ICD-10-CM

## 2019-12-15 NOTE — Telephone Encounter (Signed)
-----   Message from Cameron Sprang, MD sent at 12/12/2019  4:10 PM EDT ----- Pls let her know that I would like to go ahead with the 48-hour EEG so we can see what the brain waves show when she has loss of time,speech difficulties, or staring off. Thanks

## 2019-12-15 NOTE — Telephone Encounter (Signed)
Pt called no answer per DPR left a voice mail to  let her know that Dr Delice Lesch would like to go ahead with the 48-hour EEG so we can see what the brain waves show when she has loss of time,speech difficulties, or staring off

## 2019-12-18 NOTE — Telephone Encounter (Signed)
I have left a message for the patient to return out call to reschedule the CT that has been scheduled on 12/20/19 @ 9:40am.  I have left my direct phone # [phone] and I also left [facility] # [phone] and they can reschedule the CT for the patient that works better for her

## 2019-12-18 NOTE — Telephone Encounter (Signed)
Can you please follow up to see if patient wants to reschedule CT

## 2019-12-20 ENCOUNTER — Other Ambulatory Visit: Payer: Medicaid Other

## 2019-12-22 NOTE — Telephone Encounter (Signed)
appt for 12/20/19 was cancelled pt will call back when she can reschedule Joellen Jersey

## 2019-12-29 ENCOUNTER — Telehealth: Payer: Self-pay | Admitting: *Deleted

## 2019-12-29 NOTE — Telephone Encounter (Signed)
LMOM to call back to schedule her 48 hour EEG. Let her know as of now we have Monday 01/15/20 open and to have her calendar available so we may schedule her appointment.

## 2020-01-10 NOTE — Telephone Encounter (Signed)
Called again and requested a call back to schedule this ambulatory EEG. Asked that she call so we can get it in with time for it to be read before her next appt with Dr. Delice Lesch which is 03/27/20

## 2020-01-19 ENCOUNTER — Other Ambulatory Visit: Payer: Self-pay | Admitting: Internal Medicine

## 2020-01-25 ENCOUNTER — Telehealth: Payer: Self-pay | Admitting: Student

## 2020-01-25 NOTE — Telephone Encounter (Signed)
   Patient called after hours number with concerns about her [employer]tronic defibrillator. I promptly called patient back and spoke with her around 5:25pm. She reports that around 4:30-4:45ish, she started to feel like her defibrillator was vibrating and was worried that it was not working properly. She denies any cardiac symptoms (no chest pain, shortness of breath, palpitation, near syncope) but does states this is making her very anxious. She already sent a remote check. I told her I would call device rep and then call her back.  I called and spoke with Ronalee Belts, the [employer] rep. He reviewed remote interrogation and reported no episodes/events. Also sufficient battery (7.5 years left). He also said that their device does not vibrate so whatever patient was feeling was not coming from the device.  I called the patient back around 7:26pm and reassured her that she has had no concerning arrhythmias and that her Medtronic device does not vibrate. Unsure what is causing vibration feeling. Confirmed again that patient does not having any cardiac symptoms.   Patient thanked me for calling.  Darreld Mclean, PA-C 01/25/2020 8:29 PM

## 2020-01-29 ENCOUNTER — Telehealth: Payer: Self-pay | Admitting: *Deleted

## 2020-01-29 NOTE — Telephone Encounter (Signed)
LMOM to please call back to schedule 48 hour ambulatory EEG and that we are booking into October and Dr. Delice Lesch would like for it to be completed before her appointent in November

## 2020-02-07 NOTE — Telephone Encounter (Signed)
LMOM to call back to get ambulatory EEG scheduled before November 11 appointment with Dr. Delice Lesch. Appt slots are booking up fast. I gave her my direct line so we can get her scheduled ASAP.

## 2020-02-12 ENCOUNTER — Ambulatory Visit (INDEPENDENT_AMBULATORY_CARE_PROVIDER_SITE_OTHER): Payer: Medicaid Other | Admitting: Emergency Medicine

## 2020-02-12 DIAGNOSIS — I4581 Long QT syndrome: Secondary | ICD-10-CM

## 2020-02-12 LAB — CUP PACEART REMOTE DEVICE CHECK
Battery Remaining Longevity: 89 mo
Battery Voltage: 3 V
Brady Statistic RV Percent Paced: 0.01 %
Date Time Interrogation Session: 20210927063624
HighPow Impedance: 44 Ohm
HighPow Impedance: 68 Ohm
Implantable Lead Implant Date: 20080828
Implantable Lead Location: 753860
Implantable Lead Model: 6947
Implantable Pulse Generator Implant Date: 20161117
Lead Channel Impedance Value: 323 Ohm
Lead Channel Impedance Value: 380 Ohm
Lead Channel Pacing Threshold Amplitude: 1.75 V
Lead Channel Pacing Threshold Pulse Width: 0.4 ms
Lead Channel Sensing Intrinsic Amplitude: 3.375 mV
Lead Channel Sensing Intrinsic Amplitude: 3.375 mV
Lead Channel Setting Pacing Amplitude: 3.5 V
Lead Channel Setting Pacing Pulse Width: 0.4 ms
Lead Channel Setting Sensing Sensitivity: 0.3 mV

## 2020-02-14 NOTE — Progress Notes (Signed)
Remote ICD transmission.   

## 2020-02-26 ENCOUNTER — Telehealth: Payer: Self-pay

## 2020-02-26 NOTE — Telephone Encounter (Signed)
Patient calls nurse line regarding discoloration in R great toe. Patient reports onset on Friday evening. Denies previous injury, redness, swelling or fever. Patient is Type II diabetic. States that the base of the toenail has black color and is wrapping around the edge of the toenail. Spoke with Dr. McDiarmid regarding patient. Advised that patient be evaluated in the office. Scheduled for next available tomorrow morning.   Strict ED/UC precautions given  Talbot Grumbling, RN

## 2020-02-27 ENCOUNTER — Ambulatory Visit: Payer: Medicaid Other

## 2020-02-28 ENCOUNTER — Other Ambulatory Visit: Payer: Self-pay

## 2020-02-28 ENCOUNTER — Ambulatory Visit (INDEPENDENT_AMBULATORY_CARE_PROVIDER_SITE_OTHER): Payer: Medicaid Other | Admitting: Family Medicine

## 2020-02-28 VITALS — BP 140/70 | HR 73 | Ht 67.0 in | Wt 188.4 lb

## 2020-02-28 DIAGNOSIS — E119 Type 2 diabetes mellitus without complications: Secondary | ICD-10-CM | POA: Diagnosis not present

## 2020-02-28 DIAGNOSIS — L819 Disorder of pigmentation, unspecified: Secondary | ICD-10-CM | POA: Diagnosis not present

## 2020-02-28 DIAGNOSIS — I10 Essential (primary) hypertension: Secondary | ICD-10-CM | POA: Diagnosis not present

## 2020-02-28 DIAGNOSIS — Z794 Long term (current) use of insulin: Secondary | ICD-10-CM | POA: Diagnosis not present

## 2020-02-28 LAB — POCT GLYCOSYLATED HEMOGLOBIN (HGB A1C): HbA1c, POC (controlled diabetic range): 9.9 % — AB (ref 0.0–7.0)

## 2020-02-28 NOTE — Progress Notes (Signed)
    SUBJECTIVE:   CHIEF COMPLAINT / HPI:   R TOE DISCOLORATION Noticed her R great toe getting darker.  No know injury but has long standing decreased sensation, she believes due to diabetes.  No pain or fever or spreading redness or discharge.  Has chronic R heel pain   DIABETES Knows her medications and what she is taking.  Did not bring medications nor meter   PERTINENT  PMH / PSH: sarcoidosis,   OBJECTIVE:   BP 140/70   Pulse 73   Ht 5\' 7"  (1.702 m)   Wt 188 lb 6.4 oz (85.5 kg)   LMP 07/23/2014   SpO2 97%   BMI 29.51 kg/m   Feet - mild indistinct hyperpigmentation of R great toe, no definite bruising. Compared to L side has FROM with full strength.  Can not feel monofilament on plantar surface of either foot.  Feet are warm with full DP pulses bilaterally   ASSESSMENT/PLAN:   Discoloration of skin of toe No signs of infection or ischemia.   Probable occult trauma due to neuropathy and loss of sensation.  Ask her to monitor - see after visit summary   Diabetes mellitus, type 2 ([place]) Poorly controlled.  Asked her to follow up soon with Dr Valentina Lucks and then make an appointment with PCP.   Increased Tresheba to 65 units asked to bring all medications and meters etc   Essential hypertension BP Readings from Last 3 Encounters:  02/28/20 140/70  11/29/19 126/74  11/07/19 128/80   Nearly at goal today.   Continue to monitor    Discussed Covid and flu shots and she will think more about them   Lind Covert, [place]

## 2020-02-28 NOTE — Assessment & Plan Note (Signed)
No signs of infection or ischemia.   Probable occult trauma due to neuropathy and loss of sensation.  Ask her to monitor - see after visit summary

## 2020-02-28 NOTE — Patient Instructions (Addendum)
Good to see you today!  Thanks for coming in.  I think you might have bumped or bruised your great toe.  I do not see any infection or lack of blood flow.   If it gets worse or you develop pain or swelling please come back  Increase your Annabell Howells to 65 units a day  Make an appointment to see Dr Valentina Lucks - bring all your medications and meters and insulin.  He will help you adjust your diabetes medications   Make an appointment in a month to see  Dr Jeannine Kitten to check on your diabetes and blood pressure and for blood tests  Keep thinking about the Covid and Flu vaccine

## 2020-02-28 NOTE — Assessment & Plan Note (Signed)
Poorly controlled.  Asked her to follow up soon with Dr Valentina Lucks and then make an appointment with PCP.   Increased Tresheba to 65 units asked to bring all medications and meters etc

## 2020-02-28 NOTE — Assessment & Plan Note (Signed)
BP Readings from Last 3 Encounters:  02/28/20 140/70  11/29/19 126/74  11/07/19 128/80   Nearly at goal today.   Continue to monitor

## 2020-03-04 NOTE — Addendum Note (Signed)
Addended by: Talbert Cage L on: 03/04/2020 10:46 AM   Modules accepted: Miquel Dunn

## 2020-03-05 ENCOUNTER — Other Ambulatory Visit: Payer: Self-pay | Admitting: Obstetrics

## 2020-03-05 DIAGNOSIS — B3731 Acute candidiasis of vulva and vagina: Secondary | ICD-10-CM

## 2020-03-05 DIAGNOSIS — B373 Candidiasis of vulva and vagina: Secondary | ICD-10-CM

## 2020-03-06 ENCOUNTER — Other Ambulatory Visit: Payer: Self-pay

## 2020-03-06 MED ORDER — FLUCONAZOLE 150 MG PO TABS: 150.0000 mg | ORAL_TABLET | Freq: Once | ORAL | 0 refills | Status: DC

## 2020-03-06 NOTE — Telephone Encounter (Signed)
Patient is requesting a diflucan she is having some itching and thinks it is yeast.

## 2020-03-07 ENCOUNTER — Other Ambulatory Visit: Payer: Self-pay

## 2020-03-07 ENCOUNTER — Ambulatory Visit: Payer: Medicaid Other | Admitting: Pharmacist

## 2020-03-07 DIAGNOSIS — B3731 Acute candidiasis of vulva and vagina: Secondary | ICD-10-CM

## 2020-03-07 DIAGNOSIS — B373 Candidiasis of vulva and vagina: Secondary | ICD-10-CM

## 2020-03-07 MED ORDER — FLUCONAZOLE 150 MG PO TABS: 150.0000 mg | ORAL_TABLET | Freq: Once | ORAL | 0 refills | Status: AC

## 2020-03-07 NOTE — Progress Notes (Signed)
Rx sent per protocol for pt sx's of yeast infection.  Pt lvm on 03/06/20 requesting diflucan  TC to make pt aware Rx sent  pt not ava lvm.

## 2020-03-14 ENCOUNTER — Ambulatory Visit (INDEPENDENT_AMBULATORY_CARE_PROVIDER_SITE_OTHER): Payer: Medicaid Other | Admitting: Pharmacist

## 2020-03-14 ENCOUNTER — Other Ambulatory Visit: Payer: Self-pay

## 2020-03-14 ENCOUNTER — Encounter: Payer: Self-pay | Admitting: Pharmacist

## 2020-03-14 DIAGNOSIS — M5481 Occipital neuralgia: Secondary | ICD-10-CM

## 2020-03-14 DIAGNOSIS — E119 Type 2 diabetes mellitus without complications: Secondary | ICD-10-CM | POA: Diagnosis not present

## 2020-03-14 DIAGNOSIS — Z794 Long term (current) use of insulin: Secondary | ICD-10-CM | POA: Diagnosis not present

## 2020-03-14 MED ORDER — ACCU-CHEK SOFTCLIX LANCETS MISC: 12 refills | Status: DC

## 2020-03-14 MED ORDER — INSULIN PEN NEEDLE 31G X 8 MM MISC: 3 refills | Status: DC

## 2020-03-14 MED ORDER — OZEMPIC (0.25 OR 0.5 MG/DOSE) 2 MG/1.5ML ~~LOC~~ SOPN: 0.5000 mg | PEN_INJECTOR | SUBCUTANEOUS | 0 refills | Status: DC

## 2020-03-14 MED ORDER — ACCU-CHEK GUIDE VI STRP: ORAL_STRIP | 12 refills | Status: DC

## 2020-03-14 MED ORDER — GABAPENTIN 300 MG PO CAPS: 300.0000 mg | ORAL_CAPSULE | Freq: Three times a day (TID) | ORAL | 2 refills | Status: DC

## 2020-03-14 MED ORDER — NOVOLOG FLEXPEN 100 UNIT/ML ~~LOC~~ SOPN: PEN_INJECTOR | SUBCUTANEOUS | 3 refills | Status: DC

## 2020-03-14 NOTE — Assessment & Plan Note (Signed)
Pain Management chronic, remains in pain, has not tried doses higher than current with Gabapentin.  - Increased gabapentin from 200 mg TID to 300 mg TID for peripheral neuropathy and pain management. Patient educated on purpose, proper use and potential adverse effects of sedation.  Following instruction patient verbalized understanding of treatment plan.

## 2020-03-14 NOTE — Assessment & Plan Note (Signed)
Diabetes longstanding currently uncontrolled with A1C 9.9%. Patient did not bring glucometer, however the recalled readings appears to be improving. Encouraged patient to bring glucometer and medications to next visit. To optimize her diabetes regimen, will start a GLP-1 agonist and reduce insulin requirement. Discussed the clinical benefits and potential side effects of Ozempic. Counseled on proper injection technique with teach-back method. Additionally, patient is a good candidate for CGM device as she is on multiple daily insulin injections and check sugars 3-4 times a day. Will plan to discuss benefits of CGM "LibreView 2.0" at next follow-up visit. Will also check a UACR to assess kidney function and proteinuria at next visit  -Started Ozempic 0.25 mg weekly -Decreased Tresiba from 65 units to 50 units daily -Changed Novolog SSI to:  10 units for BG > 160   8 units for BG 150-160  6 units for BG <150 -Continued metformin 500 mg daily  -Refilled glucometer lancets and test strips -Check UACR next visit -Discuss CGM benefits at next visit -Extensively discussed pathophysiology of diabetes, recommended lifestyle interventions, dietary effects on blood sugar control -Counseled on s/sx of and management of hypoglycemia -Next A1C anticipated January 2021.

## 2020-03-14 NOTE — Progress Notes (Signed)
S:     Chief Complaint  Patient presents with   Medication Management    Diabetes    Patient arrives in good spirits ambulating with a cane. Presents for diabetes evaluation, education, and management. Patient was referred and last seen by Primary Care Provider on 02/28/20 in which Tyler Aas was increased to 65 units daily.  Patient reports Diabetes was diagnosed in 1996 as gestational diabetes and continued to T2DM.   Today, patient reports medication adherence with diabetes medications. For Novolog SSI, reports taking 5 units when BG>200, 4 units when BG 150-160, and 3 units when BG 140 or less. Patient did not bring medications or glucometer today, but checks sugars 3 times daily and reports AM sugars 147-163, sugars before meals 150-160, and 180s after meals. Patient cannot recall any lows or symptomatic lows in the past month. Admits she drinks ginger-ale which has some sugar, but it's very helpful for her IBS. Additionally, patient requested refills for glucometer test strips and lancets.   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   Current diabetes medications include: Tresiba 65 units daily, Novolog 0-5 units TID w/meals, metformin XR 500 mg daily Current hypertension medications include: propanolol 80 mg BID (CVD/ migraines) Current hyperlipidemia medications include: none  Patient denies hypoglycemic events.  Patient reported dietary habits: limits sweet and sugar intake; does drink ginger-ale and saltine crackers for IBS  Patient-reported exercise habits: limited as she walks with a cane    O:  Physical Exam Vitals reviewed.  Constitutional:      Appearance: Normal appearance.  Neurological:     Mental Status: She is alert.   Psychiatric:        Mood and Affect: Mood normal.        Thought Content: Thought content normal.    Review of Systems  All other systems reviewed and are negative.  Lab Results  Component Value Date   HGBA1C 9.9 (A) 02/28/2020   There were no vitals filed for this visit.  Lipid Panel     Component Value Date/Time   CHOL 166 03/29/2015 1209   TRIG 236 (H) 12/24/2014 1533   HDL 14 (L) 12/24/2014 1533   CHOLHDL 8.7 (H) 12/24/2014 1533   VLDL 47 (H) 12/24/2014 1533   LDLCALC 61 12/24/2014 1533    Home fasting blood sugars: 147-163 2 hour post-meal/random blood sugars: 150-160 before meals and 180s after meals   Clinical Atherosclerotic Cardiovascular Disease (ASCVD): No  The ASCVD Risk score Mikey Bussing DC Jr., et al., 2013) failed to calculate for the following reasons:   Cannot find a previous HDL lab   Cannot find a previous total cholesterol lab    A/P: Diabetes longstanding currently uncontrolled with A1C 9.9%. Patient did not bring glucometer, however the recalled readings appears to be improving. Encouraged patient to bring glucometer and medications to next visit. To optimize her diabetes regimen, will start a GLP-1 agonist and reduce insulin requirement. Discussed the clinical benefits and potential side effects of Ozempic. Counseled on proper injection technique with teach-back method. Additionally, patient is a good candidate for CGM device as she is on multiple daily insulin injections and check sugars 3-4 times a day. Will plan to discuss benefits of CGM "LibreView 2.0" at next follow-up visit. Will also check a UACR to assess kidney function  and proteinuria at next visit  -Started Ozempic 0.25 mg weekly -Decreased Tresiba from 65 units to 50 units daily -Changed Novolog SSI to:  10 units for BG > 160   8 units for BG 150-160  6 units for BG <150 -Continued metformin 500 mg daily  -Refilled glucometer lancets and test strips -Check UACR next visit -Discuss CGM  benefits at next visit -Extensively discussed pathophysiology of diabetes, recommended lifestyle interventions, dietary effects on blood sugar control -Counseled on s/sx of and management of hypoglycemia -Next A1C anticipated January 2021.  Medication Samples have been provided to the patient.  Drug name: Ozempic     Strength: 0.25 mg        Qty: 1  LOT: [id number]  Exp.Date: 03/17/2022  Dosing instructions: Inject 0.25 mg subcutaneously weekly  Drug name: Tyler Aas   Strength: 0.25 mg        Qty: 3  LOT: [id number]   Exp.Date: 09/14/2021  Dosing instructions: Inject 50 units subcutaneously daily  The patient has been instructed regarding the correct time, dose, and frequency of taking this medication, including desired effects and most common side effects.   ASCVD risk - primary prevention in patient with diabetes. Last LDL 61 it out of date (2016). No history of prior statin use per patient.  -Check fasting lipid panel at next visit -Consider statin based on lipid results   Pain Management chronic, remains in pain, has not tried doses higher than current with Gabapentin.  - Increased gabapentin from 200 mg TID to 300 mg TID for peripheral neuropathy and pain management. Patient educated on purpose, proper use and potential adverse effects of sedation.  Following instruction patient verbalized understanding of treatment plan.    Written patient instructions provided.  Total time in face to face counseling 30 minutes.   Follow up Pharmacist Clinic Visit in 1 month. Patient seen with Mercy Riding, PharmD - PGY-1 Resident. and Lorel Monaco, PharmD, PGY2 Pharmacy Resident.

## 2020-03-14 NOTE — Progress Notes (Signed)
Reviewed: I agree with Dr. Koval's documentation and management. 

## 2020-03-14 NOTE — Patient Instructions (Addendum)
It was nice to see you today!  Your goal blood sugar is 80-130 before eating and less than 180 after eating.   Medication Changes: Begin Tresiba 50 units daily starting tomorrow  Begin taking Ozempic 0.25 mg weekly   Begin taking new Novolog sliding scale   Use 10 units for Sugars > 160   Use 8 units for sugars 150-160  Use 6 units for sugars <150  Continue metformin 500 mg daily  We have sent a new prescription of Gabapentin 300 mg three times daily to your pharmacy  Monitor blood sugars at home and keep a log (glucometer or piece of paper) to bring with you to your next visit.  Keep up the good work with diet and exercise. Aim for a diet full of vegetables, fruit and lean meats (chicken, [place], fish). Try to limit salt intake by eating fresh or frozen vegetables (instead of canned), rinse canned vegetables prior to cooking and do not add any additional salt to meals.   Bring medications and glucometer to next visit on December 2nd at 9:00AM Remember to come fasting for your cholesterol labs

## 2020-03-27 ENCOUNTER — Ambulatory Visit: Payer: Medicaid Other | Admitting: Neurology

## 2020-04-01 NOTE — Progress Notes (Deleted)
    SUBJECTIVE:   CHIEF COMPLAINT / HPI:   DM: ozempic, tresiba 50U, novolog, metformin  Copd: qvar  Seizure: linzess  HM: tetanus, pap, flu  PERTINENT  PMH / PSH: ***  OBJECTIVE:   LMP 07/23/2014   ***  ASSESSMENT/PLAN:   No problem-specific Assessment & Plan notes found for this encounter.     Benay Pike, MD [place]

## 2020-04-02 ENCOUNTER — Ambulatory Visit: Payer: Medicaid Other | Admitting: Family Medicine

## 2020-04-18 ENCOUNTER — Ambulatory Visit: Payer: Medicaid Other | Admitting: Pharmacist

## 2020-05-20 ENCOUNTER — Ambulatory Visit (INDEPENDENT_AMBULATORY_CARE_PROVIDER_SITE_OTHER): Payer: Medicaid Other

## 2020-05-20 DIAGNOSIS — I4581 Long QT syndrome: Secondary | ICD-10-CM

## 2020-05-20 LAB — CUP PACEART REMOTE DEVICE CHECK
Battery Remaining Longevity: 85 mo
Battery Voltage: 3 V
Brady Statistic RV Percent Paced: 0.01 %
Date Time Interrogation Session: 20220101190243
HighPow Impedance: 50 Ohm
HighPow Impedance: 75 Ohm
Implantable Lead Implant Date: 20080828
Implantable Lead Location: 753860
Implantable Lead Model: 6947
Implantable Pulse Generator Implant Date: 20161117
Lead Channel Impedance Value: 323 Ohm
Lead Channel Impedance Value: 399 Ohm
Lead Channel Pacing Threshold Amplitude: 1.125 V
Lead Channel Pacing Threshold Pulse Width: 0.4 ms
Lead Channel Sensing Intrinsic Amplitude: 4.5 mV
Lead Channel Sensing Intrinsic Amplitude: 4.5 mV
Lead Channel Setting Pacing Amplitude: 2.5 V
Lead Channel Setting Pacing Pulse Width: 0.4 ms
Lead Channel Setting Sensing Sensitivity: 0.3 mV

## 2020-05-30 ENCOUNTER — Other Ambulatory Visit: Payer: Self-pay | Admitting: Family Medicine

## 2020-05-30 DIAGNOSIS — Z794 Long term (current) use of insulin: Secondary | ICD-10-CM

## 2020-05-30 DIAGNOSIS — E119 Type 2 diabetes mellitus without complications: Secondary | ICD-10-CM

## 2020-06-03 NOTE — Progress Notes (Signed)
Remote ICD transmission.   

## 2020-07-13 ENCOUNTER — Other Ambulatory Visit: Payer: Self-pay

## 2020-07-13 ENCOUNTER — Emergency Department (HOSPITAL_COMMUNITY): Payer: Medicaid Other

## 2020-07-13 ENCOUNTER — Emergency Department (HOSPITAL_COMMUNITY)
Admission: EM | Admit: 2020-07-13 | Discharge: 2020-07-13 | Disposition: A | Discharge status: Home / Self Care | Payer: Medicaid Other | Attending: Emergency Medicine | Admitting: Emergency Medicine

## 2020-07-13 ENCOUNTER — Encounter (HOSPITAL_COMMUNITY): Payer: Self-pay | Admitting: Emergency Medicine

## 2020-07-13 DIAGNOSIS — Z794 Long term (current) use of insulin: Secondary | ICD-10-CM | POA: Diagnosis not present

## 2020-07-13 DIAGNOSIS — J45909 Unspecified asthma, uncomplicated: Secondary | ICD-10-CM | POA: Diagnosis not present

## 2020-07-13 DIAGNOSIS — Z7982 Long term (current) use of aspirin: Secondary | ICD-10-CM | POA: Diagnosis not present

## 2020-07-13 DIAGNOSIS — I10 Essential (primary) hypertension: Secondary | ICD-10-CM | POA: Insufficient documentation

## 2020-07-13 DIAGNOSIS — J449 Chronic obstructive pulmonary disease, unspecified: Secondary | ICD-10-CM | POA: Insufficient documentation

## 2020-07-13 DIAGNOSIS — Z7951 Long term (current) use of inhaled steroids: Secondary | ICD-10-CM | POA: Diagnosis not present

## 2020-07-13 DIAGNOSIS — Z85118 Personal history of other malignant neoplasm of bronchus and lung: Secondary | ICD-10-CM | POA: Diagnosis not present

## 2020-07-13 DIAGNOSIS — R072 Precordial pain: Secondary | ICD-10-CM

## 2020-07-13 DIAGNOSIS — R079 Chest pain, unspecified: Secondary | ICD-10-CM | POA: Diagnosis not present

## 2020-07-13 DIAGNOSIS — R0789 Other chest pain: Secondary | ICD-10-CM | POA: Insufficient documentation

## 2020-07-13 DIAGNOSIS — Z7984 Long term (current) use of oral hypoglycemic drugs: Secondary | ICD-10-CM | POA: Diagnosis not present

## 2020-07-13 DIAGNOSIS — E1140 Type 2 diabetes mellitus with diabetic neuropathy, unspecified: Secondary | ICD-10-CM | POA: Diagnosis not present

## 2020-07-13 LAB — CBC
HCT: 41.3 % (ref 36.0–46.0)
Hemoglobin: 14 g/dL (ref 12.0–15.0)
MCH: 29.3 pg (ref 26.0–34.0)
MCHC: 33.9 g/dL (ref 30.0–36.0)
MCV: 86.4 fL (ref 80.0–100.0)
Platelets: 251 10*3/uL (ref 150–400)
RBC: 4.78 MIL/uL (ref 3.87–5.11)
RDW: 11.9 % (ref 11.5–15.5)
WBC: 9.1 10*3/uL (ref 4.0–10.5)
nRBC: 0 % (ref 0.0–0.2)

## 2020-07-13 LAB — TROPONIN I (HIGH SENSITIVITY)
Troponin I (High Sensitivity): 6 ng/L (ref <18)
Troponin I (High Sensitivity): 6 ng/L (ref <18)

## 2020-07-13 LAB — BASIC METABOLIC PANEL
Anion gap: 10 (ref 5–15)
BUN: 7 mg/dL (ref 6–20)
CO2: 22 mmol/L (ref 22–32)
Calcium: 9.7 mg/dL (ref 8.9–10.3)
Chloride: 102 mmol/L (ref 98–111)
Creatinine, Ser: 0.57 mg/dL (ref 0.44–1.00)
GFR, Estimated: >60 mL/min (ref >60)
Glucose, Bld: 250 mg/dL — ABNORMAL HIGH (ref 70–99)
Potassium: 3.8 mmol/L (ref 3.5–5.1)
Sodium: 134 mmol/L — ABNORMAL LOW (ref 135–145)

## 2020-07-13 LAB — I-STAT BETA HCG BLOOD, ED (MC, WL, AP ONLY): I-stat hCG, quantitative: <5 m[IU]/mL (ref <5)

## 2020-07-13 MED ORDER — ACETAMINOPHEN 500 MG PO TABS
1000.0000 mg | ORAL_TABLET | Freq: Once | ORAL | Status: AC
  Administered 2020-07-13: 1000 mg via ORAL
  Filled 2020-07-13: qty 2

## 2020-07-13 NOTE — Discharge Instructions (Signed)
Please follow up with your primary care doctor.   Follow up with referred cardiology.  Return to the Emergency Department immediately if you experiencing worsening chest pain, difficulty breathing, nausea/vomiting, get very sweaty, headache or any other worsening or concerning symptoms.

## 2020-07-13 NOTE — ED Triage Notes (Signed)
Patient here with chest pain that goes across her chest from right to left and goes just under her defibrillator.  Patient states that it started at 1130 last night and has not stopped.  She describes as a dull pain, continuous.  No nausea or vomiting, but does have some shortness of breath with the pain.

## 2020-07-13 NOTE — ED Provider Notes (Addendum)
[facility] EMERGENCY DEPARTMENT Provider Note   CSN: [id number] Arrival date & time: 07/13/20  0053     History Chief Complaint  Patient presents with  . Chest Pain    [name] is a 50 y.o. female.  Patient presents to the emergency department with a chief complaint of sharp stabbing chest pain.  She states that the symptoms started about 1130 last night.  She describes that as a very brief, lasting only a second or 2 at a time, sharp twinge.  She denies any associated shortness of breath.  Denies any nausea, or vomiting.  She states that when the pain was at its worst, it may have taken her breath away, but she does not feel winded or short of breath.  She does not report diaphoresis.  She denies fevers or chills.  She states that the symptoms have been improving throughout the night.  She states that the pain comes and goes so quickly, but that the severity is decreasing.  She questions whether she has gas bubbles that are causing this.  The history is provided by the patient. No language interpreter was used.       Past Medical History:  Diagnosis Date  . Abdominal pain, periumbilic   . Allergic rhinitis   . Allergy   . Anemia   . Anxiety   . Arthritis   . Asthma   . Automatic implantable cardioverter-defibrillator in situ 2006, replaced 2008   MEDTRONIC  . Bronchitis   . Cancer ([place])    Nodules in lungs pt believes were cancerous pt unsure  . COPD (chronic obstructive pulmonary disease) ([place])   . DM (diabetes mellitus) ([place]) 1997/1998   type 1, initially gestational but soon after child birth developed IDDM  . Esophageal stricture 2005/2009   esophageal strictures dilated 2005, 2009  . Eye hemorrhage    bilateral  . Fibroid, uterine   . Fibromyalgia   . GERD (gastroesophageal reflux disease) 2005  . History of blood transfusion years ago  . Hypertension   . Internal hemorrhoids 2010   2011 band ligation of int rrhoids.  . Long Q-T  syndrome   . Neuropathy in diabetes ([place])   . Palpitation   . Sarcoidosis   . Seizure disorder ([place])    none recently  . Stroke ([place])    QUESTIONABLE TIA     Patient Active Problem List   Diagnosis Date Noted  . Discoloration of skin of toe 02/28/2020  . Plantar fasciitis, right 08/25/2019  . Stress at home 08/25/2019  . Ataxia 06/30/2019  . Nodule of skin of left upper extremity 01/18/2019  . Pain of left hip joint 07/31/2018  . Migraine without aura and without status migrainosus, not intractable 02/16/2017  . Blurry vision, bilateral 12/30/2016  . Chronic daily headache 11/09/2016  . Neck pain 07/20/2016  . ANA positive 07/16/2016  . CRP elevated 07/16/2016  . Lower extremity weakness 06/17/2016  . 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 Occipital neuralgia of left side 03/05/2016  . Other headache syndrome 03/05/2016  . Gait abnormality 02/12/2015  . 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 Chronic fatigue 01/01/2015  . Nausea without vomiting 12/11/2014  . Dyspnea 12/10/2014  . Weight loss   . Prolonged Q-T interval on ECG 11/06/2014  . Sarcoidosis 11/06/2014  . COPD (chronic obstructive pulmonary disease) ([place]) 11/06/2014  . Hemorrhagic shock ([place]) 11/06/2014  . Fibroid uterus 08/30/2014  . PSVT 02/24/2010  .  PRECORDIAL PAIN 08/22/2009  . Palpitations 10/05/2008  . Automatic implantable cardioverter-defibrillator in situ 10/05/2008  . Absence seizure disorder ([place]) 09/26/2008  . Constipation 02/08/2008  . Sarcoidosis, stage 3 ([place]) 03/15/2007  . Diabetes mellitus, type 2 ([place]) 03/15/2007  . Essential hypertension 03/15/2007  . SYNDROME, LONG QT 03/15/2007  . ALLERGIC RHINITIS 03/15/2007  . OBSTRUCTIVE CHRONIC BRONCHITIS 03/15/2007  . GERD (gastroesophageal reflux disease) 03/15/2007  . ESOPHAGITIS 02/05/2004  . ESOPHAGEAL STRICTURE 02/05/2004    Past Surgical History:  Procedure Laterality Date  . AUTOMATIC  IMPLANTABLE CARDIAC DEFIBRILLATOR SITU  2006,    ICD-Medtronic   Remote - Yes   . CERVIX REMOVAL    . DILITATION & CURRETTAGE/HYSTROSCOPY WITH HYDROTHERMAL ABLATION N/A 06/07/2013   Procedure: DILATATION & CURETTAGE/HYSTEROSCOPY WITH attempted HYDROTHERMAL ABLATION;  Surgeon: Frederico Hamman, MD;  Location: [facility];  Service: Gynecology;  Laterality: N/A;  . EP IMPLANTABLE DEVICE N/A 04/04/2015   Procedure:  ICD Generator Changeout;  Surgeon: Evans Lance, MD;  Location: [facility];  Service: Cardiovascular;  Laterality: N/A;  . ESOPHAGOGASTRODUODENOSCOPY (EGD) WITH PROPOFOL N/A 10/24/2014   Procedure: ESOPHAGOGASTRODUODENOSCOPY (EGD) WITH PROPOFOL;  Surgeon: Inda Castle, MD;  Location: [facility];  Service: Endoscopy;  Laterality: N/A;  . EXPLORATORY LAPAROTOMY  11/06/14   evacuation of post TAH/BSO hematoma  . HAND SURGERY Right    x 2  . HEMORRHOID BANDING  03/2010  . SALPINGECTOMY    . SVT ablation    . TOTAL ABDOMINAL HYSTERECTOMY W/ BILATERAL SALPINGOOPHORECTOMY  11/06/14   at [facility] for menorrhagia, pelvic pain and enlarging uterine fibroids  . TUBAL LIGATION    . VIDEO BRONCHOSCOPY Bilateral 01/24/2015   Procedure: VIDEO BRONCHOSCOPY WITHOUT FLUORO;  Surgeon: Marshell Garfinkel, MD;  Location: [facility];  Service: Cardiopulmonary;  Laterality: Bilateral;     OB History    Gravida  6   Para  4   Term  4   Preterm      AB  2   Living  3     SAB  2   IAB      Ectopic      Multiple      Live Births  4           Family History  Problem Relation Age of Onset  . Heart attack Mother   . Lung cancer Maternal Uncle        x2  . Breast cancer Maternal Aunt        x2  . Stomach cancer Paternal Uncle   . Stomach cancer Paternal Grandfather   . Stomach cancer Paternal Grandmother   . Diabetes Father   . Positive PPD/TB Exposure Father   . Glaucoma Father   . Esophageal cancer Paternal Aunt   . Colon cancer Maternal Grandfather     Social History    Tobacco Use  . Smoking status: Never Smoker  . Smokeless tobacco: Never Used  Vaping Use  . Vaping Use: Never used  Substance Use Topics  . Alcohol use: Yes    Alcohol/week: 0.0 standard drinks    Comment: None since 2015-was occasional  . Drug use: Never    Home Medications Prior to Admission medications   Medication Sig Start Date End Date Taking? Authorizing Provider  Accu-Chek Softclix Lancets lancets Use as instructed 03/14/20   Leavy Cella, RPH-CPP  acetaminophen (TYLENOL) 500 MG tablet Take 1,000 mg by mouth every 6 (six) hours as needed for mild pain or fever.  Reported on 07/22/2015    [provider]  aspirin EC 81 MG tablet Take 1 tablet (81 mg total) by mouth daily. 07/17/19   Cameron Sprang, MD  beclomethasone (QVAR) 80 MCG/ACT inhaler Inhale 2 puffs into the lungs 2 (two) times daily. 09/18/19   Madalyn Rob, MD  Camphor-Menthol-Methyl Sal (SALONPAS) 3.05-23-08 % [facility] Apply 1 patch topically daily as needed (pain).    [provider]  cycloSPORINE (RESTASIS) 0.05 % ophthalmic emulsion Place 1 drop daily into both eyes.    [provider]  fluticasone (FLONASE) 50 MCG/ACT nasal spray Place 2 sprays into both nostrils daily as needed for allergies.     [provider]  gabapentin (NEURONTIN) 300 MG capsule Take 1 capsule (300 mg total) by mouth 3 (three) times daily. 03/14/20 06/12/20  Leavy Cella, RPH-CPP  glucose blood (ACCU-CHEK GUIDE) test strip Check sugars four times daily 03/14/20   Leavy Cella, RPH-CPP  Insulin Pen Needle 31G X 8 MM MISC BD UltraFine III Pen Needles. For use with insulin pen device. Inject insulin 6 x daily 03/14/20   Leavy Cella, RPH-CPP  lamoTRIgine (LAMICTAL) 150 MG tablet Take 1 tablet (150 mg total) by mouth 2 (two) times daily. 11/22/19   Cameron Sprang, MD  Lancet Devices [facility]) lancets Use as instructed 10/20/16   Nicolette Bang, DO  levETIRAcetam (KEPPRA) 500 MG tablet Take 1  tablet (500 mg total) by mouth 2 (two) times daily. 11/22/19   Cameron Sprang, MD  linaclotide [facility]) 290 MCG CAPS capsule Take 1 capsule (290 mcg total) by mouth daily before breakfast. 09/13/19   Armbruster, Carlota Raspberry, MD  metFORMIN (GLUCOPHAGE-XR) 500 MG 24 hr tablet Take 1 tablet (500 mg total) by mouth daily with breakfast. 10/30/19 03/14/20  Sherene Sires, DO  metroNIDAZOLE (METROGEL) 0.75 % vaginal gel Place 1 Applicatorful vaginally 2 (two) times daily as needed. Patient not taking: Reported on 03/14/2020    [provider]  naproxen (NAPROSYN) 375 MG tablet Take 1 tablet (375 mg total) by mouth 2 (two) times daily with a meal. 09/01/19   Bland, Scott, DO  NOVOLOG FLEXPEN 100 UNIT/ML FlexPen 6-10 units three times per day with meals Use 10 units for Sugars > 160 Use 8 units for sugars 150-160 Use 6 units for sugars <150 03/14/20   Leavy Cella, RPH-CPP  Olopatadine HCl 0.7 % SOLN Place 1 drop into both eyes daily as needed (eye allergies).     [provider]  pantoprazole (PROTONIX) 20 MG tablet Take 1 tablet (20 mg total) by mouth daily as needed. 09/13/19   Armbruster, Carlota Raspberry, MD  potassium chloride SA (K-DUR,KLOR-CON) 20 MEQ tablet Take 1 tablet (20 mEq total) by mouth daily. 03/08/18   Sherene Sires, DO  Prenatal Vit-Fe Fumarate-FA (PREPLUS) 27-1 MG TABS Take 1 tablet by mouth daily before breakfast. 05/23/18   Shelly Bombard, MD  propranolol (INDERAL) 80 MG tablet Take 1 tablet (80 mg total) by mouth 2 (two) times daily. 01/23/20   Evans Lance, MD  Semaglutide,0.25 or 0.5MG /DOS, (OZEMPIC, 0.25 OR 0.5 MG/DOSE,) 2 MG/1.5ML SOPN Inject 0.5 mg into the skin once a week. 03/14/20   Leavy Cella, RPH-CPP  TRESIBA FLEXTOUCH 200 UNIT/ML FlexTouch Pen ADMINISTER 60 UNITS UNDER THE SKIN DAILY AT 6 AM 05/31/20   Benay Pike, MD    Allergies    Contrast media [iodinated diagnostic agents], Iohexol, Augmentin [amoxicillin-pot clavulanate], Codeine, Demerol [meperidine],  Morphine  and related, and Lantus [insulin glargine]  Review of Systems   Review of Systems  All other systems reviewed and are negative.   Physical Exam Updated Vital Signs BP 120/79   Pulse 62   Temp 98.4 F (36.9 C) (Oral)   Resp 16   LMP 07/23/2014   SpO2 97%   Physical Exam Vitals and nursing note reviewed.  Constitutional:      General: She is not in acute distress.    Appearance: She is well-developed and well-nourished.  HENT:     Head: Normocephalic and atraumatic.  Eyes:     Conjunctiva/sclera: Conjunctivae normal.  Cardiovascular:     Rate and Rhythm: Normal rate and regular rhythm.     Heart sounds: No murmur heard.   Pulmonary:     Effort: Pulmonary effort is normal. No respiratory distress.     Breath sounds: Normal breath sounds.  Chest:     Chest wall: Tenderness present.  Abdominal:     Palpations: Abdomen is soft.     Tenderness: There is no abdominal tenderness.  Musculoskeletal:        General: No edema. Normal range of motion.     Cervical back: Neck supple.  Skin:    General: Skin is warm and dry.  Neurological:     Mental Status: She is alert and oriented to person, place, and time.  Psychiatric:        Mood and Affect: Mood and affect and mood normal.        Behavior: Behavior normal.     ED Results / Procedures / Treatments   Labs (all labs ordered are listed, but only abnormal results are displayed) Labs Reviewed  BASIC METABOLIC PANEL - Abnormal; Notable for the following components:      Result Value   Sodium 134 (*)    Glucose, Bld 250 (*)    All other components within normal limits  CBC  I-STAT BETA HCG BLOOD, ED ([facility], [facility], [facility] ONLY)  TROPONIN I (HIGH SENSITIVITY)  TROPONIN I (HIGH SENSITIVITY)    EKG None  Radiology DG Chest 2 View  Result Date: 07/13/2020 CLINICAL DATA:  Chest pain EXAM: CHEST - 2 VIEW COMPARISON:  None. FINDINGS: The heart size and mediastinal contours are within normal limits. A left-sided  pacemaker seen with the lead tip at the right ventricle. For for is both lungs are clear. The visualized skeletal structures are unremarkable. IMPRESSION: No active cardiopulmonary disease. Electronically Signed   By: Prudencio Pair M.D.   On: 07/13/2020 01:34    Procedures Procedures   Medications Ordered in ED Medications  acetaminophen (TYLENOL) tablet 1,000 mg (1,000 mg Oral Given 07/13/20 0535)    ED Course  I have reviewed the triage vital signs and the nursing notes.  Pertinent labs & imaging results that were available during my care of the patient were reviewed by me and considered in my medical decision making (see chart for details).    MDM Rules/Calculators/A&P                          Patient presents with chest pain that she describes as a sharp twinges lasting for only a second or 2 at a time.  She has had numerous episodes of this tonight, but reports that it is improving significantly..   DDx includes ACS, PE, pneumothorax, aortic dissection, esophageal rupture, pericarditis, chest wall pain.  I doubt ACS based on the description of the  patient's symptoms, troponin is 6.  Repeat troponin is 6 as well.  I doubt PE.  She is not tachycardic nor hypoxic.  We interrogated her defibrillator, and there were no events per [employer] tech.  Chest x-ray is negative.  She does not have fever or chills.  Doubt infectious etiology.  There was some tenderness to palpation, chest wall inflammation is considered.  Patient is currently pain free.  Recommend close follow-up with PCP and cardiology.  Patient feels reassured, and is agreeable with plan for discharge.  All findings were discussed with patient.  Patient understands and agrees with the plan.    Final Clinical Impression(s) / ED Diagnoses Final diagnoses:  Precordial chest pain    Rx / DC Orders ED Discharge Orders    None       Montine Circle, PA-C 07/13/20 0631    Montine Circle, PA-C 07/13/20  0721    Merrily Pew, MD 07/13/20 0800

## 2020-07-26 ENCOUNTER — Encounter: Payer: Self-pay | Admitting: Gastroenterology

## 2020-08-08 ENCOUNTER — Other Ambulatory Visit: Payer: Self-pay

## 2020-08-08 ENCOUNTER — Encounter: Payer: Self-pay | Admitting: Obstetrics and Gynecology

## 2020-08-08 ENCOUNTER — Other Ambulatory Visit (HOSPITAL_COMMUNITY)
Admission: RE | Admit: 2020-08-08 | Discharge: 2020-08-08 | Disposition: A | Discharge status: Home / Self Care | Payer: Medicaid Other | Source: Ambulatory Visit | Attending: Obstetrics | Admitting: Obstetrics

## 2020-08-08 ENCOUNTER — Encounter: Payer: Self-pay | Admitting: Obstetrics

## 2020-08-08 ENCOUNTER — Ambulatory Visit (INDEPENDENT_AMBULATORY_CARE_PROVIDER_SITE_OTHER): Payer: Medicaid Other | Admitting: Obstetrics

## 2020-08-08 VITALS — BP 147/96 | HR 65 | Ht 67.0 in | Wt 185.0 lb

## 2020-08-08 DIAGNOSIS — Z01419 Encounter for gynecological examination (general) (routine) without abnormal findings: Secondary | ICD-10-CM

## 2020-08-08 DIAGNOSIS — Z90710 Acquired absence of both cervix and uterus: Secondary | ICD-10-CM | POA: Diagnosis not present

## 2020-08-08 DIAGNOSIS — Z1239 Encounter for other screening for malignant neoplasm of breast: Secondary | ICD-10-CM

## 2020-08-08 DIAGNOSIS — E569 Vitamin deficiency, unspecified: Secondary | ICD-10-CM | POA: Diagnosis not present

## 2020-08-08 DIAGNOSIS — B373 Candidiasis of vulva and vagina: Secondary | ICD-10-CM

## 2020-08-08 DIAGNOSIS — N898 Other specified noninflammatory disorders of vagina: Secondary | ICD-10-CM | POA: Insufficient documentation

## 2020-08-08 DIAGNOSIS — Z113 Encounter for screening for infections with a predominantly sexual mode of transmission: Secondary | ICD-10-CM | POA: Diagnosis not present

## 2020-08-08 DIAGNOSIS — B3731 Acute candidiasis of vulva and vagina: Secondary | ICD-10-CM

## 2020-08-08 MED ORDER — VITAFOL ULTRA 29-0.6-0.4-200 MG PO CAPS: 1.0000 | ORAL_CAPSULE | Freq: Every day | ORAL | 11 refills | Status: DC

## 2020-08-08 MED ORDER — FLUCONAZOLE 150 MG PO TABS
150.0000 mg | ORAL_TABLET | Freq: Once | ORAL | 0 refills | Status: AC
Start: 2020-08-08 — End: 2020-08-08

## 2020-08-08 NOTE — Progress Notes (Signed)
Pt would like std screening. Request refills on PNV, BV and yeast tx.

## 2020-08-08 NOTE — Progress Notes (Addendum)
Subjective:        [name] is a 50 y.o. female here for a routine exam.  Current complaints: Complains of vaginal discharge.  She is s/p TAH / BS in 2016 for symptomatic uterine fibroids.  Personal health questionnaire:  Is patient Ashkenazi Jewish, have a family history of breast and/or ovarian cancer: no Is there a family history of uterine cancer diagnosed at age < 67, gastrointestinal cancer, urinary tract cancer, family member who is a Field seismologist syndrome-associated carrier: no Is the patient overweight and hypertensive, family history of diabetes, personal history of gestational diabetes, preeclampsia or PCOS: no Is patient over 33, have PCOS,  family history of premature CHD under age 62, diabetes, smoke, have hypertension or peripheral artery disease:  no At any time, has a partner hit, kicked or otherwise hurt or frightened you?: no Over the past 2 weeks, have you felt down, depressed or hopeless?: no Over the past 2 weeks, have you felt little interest or pleasure in doing things?:no   Gynecologic History Patient's last menstrual period was 07/23/2014. Contraception: status post hysterectomy Last Pap: 2015. Results were: normal Last mammogram: ~ 2 years ago. Results were: normal  Obstetric History OB History  Gravida Para Term Preterm AB Living  6 4 4   2 3   SAB IAB Ectopic Multiple Live Births  2       4    # Outcome Date GA Lbr Len/2nd Weight Sex Delivery Anes PTL Lv  6 Term 1998        LIV  5 Term 1996        LIV  4 SAB 1995          3 Term 1993        LIV  2 SAB 1992          1 Term 1990        DEC    Past Medical History:  Diagnosis Date  . Abdominal pain, periumbilic   . Allergic rhinitis   . Allergy   . Anemia   . Anxiety   . Arthritis   . Asthma   . Automatic implantable cardioverter-defibrillator in situ 2006, replaced 2008   MEDTRONIC  . Bronchitis   . Cancer ([place])    Nodules in lungs pt believes were cancerous pt unsure  . COPD  (chronic obstructive pulmonary disease) ([place])   . DM (diabetes mellitus) ([place]) 1997/1998   type 1, initially gestational but soon after child birth developed IDDM  . Esophageal stricture 2005/2009   esophageal strictures dilated 2005, 2009  . Eye hemorrhage    bilateral  . Fibroid, uterine   . Fibromyalgia   . GERD (gastroesophageal reflux disease) 2005  . History of blood transfusion years ago  . Hypertension   . Internal hemorrhoids 2010   2011 band ligation of int rrhoids.  . Long Q-T syndrome   . Neuropathy in diabetes ([place])   . Palpitation   . Sarcoidosis   . Seizure disorder ([place])    none recently  . Stroke ([place])    QUESTIONABLE TIA     Past Surgical History:  Procedure Laterality Date  . AUTOMATIC IMPLANTABLE CARDIAC DEFIBRILLATOR SITU  2006,    ICD-Medtronic   Remote - Yes   . CERVIX REMOVAL    . DILITATION & CURRETTAGE/HYSTROSCOPY WITH HYDROTHERMAL ABLATION N/A 06/07/2013   Procedure: DILATATION & CURETTAGE/HYSTEROSCOPY WITH attempted HYDROTHERMAL ABLATION;  Surgeon: Frederico Hamman, MD;  Location: [facility];  Service: Gynecology;  Laterality: N/A;  . EP IMPLANTABLE DEVICE N/A 04/04/2015   Procedure:  ICD Generator Changeout;  Surgeon: Evans Lance, MD;  Location: [facility];  Service: Cardiovascular;  Laterality: N/A;  . ESOPHAGOGASTRODUODENOSCOPY (EGD) WITH PROPOFOL N/A 10/24/2014   Procedure: ESOPHAGOGASTRODUODENOSCOPY (EGD) WITH PROPOFOL;  Surgeon: Inda Castle, MD;  Location: [facility];  Service: Endoscopy;  Laterality: N/A;  . EXPLORATORY LAPAROTOMY  11/06/14   evacuation of post TAH/BSO hematoma  . HAND SURGERY Right    x 2  . HEMORRHOID BANDING  03/2010  . SALPINGECTOMY    . SVT ablation    . TOTAL ABDOMINAL HYSTERECTOMY W/ BILATERAL SALPINGOOPHORECTOMY  11/06/14   at [facility]. for menorrhagia, pelvic pain and enlarging uterine fibroids  . TUBAL LIGATION    . VIDEO BRONCHOSCOPY Bilateral 01/24/2015   Procedure: VIDEO BRONCHOSCOPY WITHOUT FLUORO;   Surgeon: Marshell Garfinkel, MD;  Location: [facility];  Service: Cardiopulmonary;  Laterality: Bilateral;     Current Outpatient Medications:  .  aspirin EC 81 MG tablet, Take 1 tablet (81 mg total) by mouth daily., Disp: 30 tablet, Rfl: 11 .  beclomethasone (QVAR) 80 MCG/ACT inhaler, Inhale 2 puffs into the lungs 2 (two) times daily., Disp: 1 Inhaler, Rfl: 5 .  fluconazole (DIFLUCAN) 150 MG tablet, Take 1 tablet (150 mg total) by mouth once for 1 dose., Disp: 1 tablet, Rfl: 0 .  fluticasone (FLONASE) 50 MCG/ACT nasal spray, Place 2 sprays into both nostrils daily as needed for allergies. , Disp: , Rfl:  .  Insulin Pen Needle 31G X 8 MM MISC, BD UltraFine III Pen Needles. For use with insulin pen device. Inject insulin 6 x daily, Disp: 200 each, Rfl: 3 .  lamoTRIgine (LAMICTAL) 150 MG tablet, Take 1 tablet (150 mg total) by mouth 2 (two) times daily., Disp: 180 tablet, Rfl: 3 .  Lancet Devices (ACCU-CHEK SOFTCLIX) lancets, Use as instructed, Disp: 1 each, Rfl: 0 .  levETIRAcetam (KEPPRA) 500 MG tablet, Take 1 tablet (500 mg total) by mouth 2 (two) times daily., Disp: 180 tablet, Rfl: 3 .  linaclotide (LINZESS) 290 MCG CAPS capsule, Take 1 capsule (290 mcg total) by mouth daily before breakfast., Disp: 90 capsule, Rfl: 1 .  metFORMIN (GLUCOPHAGE-XR) 500 MG 24 hr tablet, Take 1 tablet (500 mg total) by mouth daily with breakfast., Disp: 30 tablet, Rfl: 2 .  NOVOLOG FLEXPEN 100 UNIT/ML FlexPen, 6-10 units three times per day with meals Use 10 units for Sugars > 160 Use 8 units for sugars 150-160 Use 6 units for sugars <150, Disp: 15 mL, Rfl: 3 .  pantoprazole (PROTONIX) 20 MG tablet, Take 1 tablet (20 mg total) by mouth daily as needed., Disp: 90 tablet, Rfl: 3 .  potassium chloride SA (K-DUR,KLOR-CON) 20 MEQ tablet, Take 1 tablet (20 mEq total) by mouth daily., Disp: 30 tablet, Rfl: 2 .  Prenat-Fe Poly-Methfol-FA-DHA (VITAFOL ULTRA) 29-0.6-0.4-200 MG CAPS, Take 1 capsule by mouth daily before  breakfast., Disp: 30 capsule, Rfl: 11 .  propranolol (INDERAL) 80 MG tablet, Take 1 tablet (80 mg total) by mouth 2 (two) times daily., Disp: 180 tablet, Rfl: 3 .  TRESIBA FLEXTOUCH 200 UNIT/ML FlexTouch Pen, ADMINISTER 60 UNITS UNDER THE SKIN DAILY AT 6 AM, Disp: 9 mL, Rfl: 5 .  Accu-Chek Softclix Lancets lancets, Use as instructed, Disp: 100 each, Rfl: 12 .  acetaminophen (TYLENOL) 500 MG tablet, Take 1,000 mg by mouth every 6 (six) hours as needed for mild pain or fever. Reported on 07/22/2015, Disp: ,  Rfl:  .  Camphor-Menthol-Methyl Sal (SALONPAS) 3.05-23-08 % PTCH, Apply 1 patch topically daily as needed (pain)., Disp: , Rfl:  .  cycloSPORINE (RESTASIS) 0.05 % ophthalmic emulsion, Place 1 drop daily into both eyes. (Patient not taking: Reported on 08/08/2020), Disp: , Rfl:  .  gabapentin (NEURONTIN) 300 MG capsule, Take 1 capsule (300 mg total) by mouth 3 (three) times daily., Disp: 90 capsule, Rfl: 2 .  glucose blood (ACCU-CHEK GUIDE) test strip, Check sugars four times daily, Disp: 100 each, Rfl: 12 .  naproxen (NAPROSYN) 375 MG tablet, Take 1 tablet (375 mg total) by mouth 2 (two) times daily with a meal. (Patient not taking: Reported on 08/08/2020), Disp: 60 tablet, Rfl: 3 .  Olopatadine HCl 0.7 % SOLN, Place 1 drop into both eyes daily as needed (eye allergies). , Disp: , Rfl:  .  Semaglutide,0.25 or 0.5MG /DOS, (OZEMPIC, 0.25 OR 0.5 MG/DOSE,) 2 MG/1.5ML SOPN, Inject 0.5 mg into the skin once a week. (Patient not taking: Reported on 08/08/2020), Disp: 1.5 mL, Rfl: 0 Allergies  Allergen Reactions  . Contrast Media [Iodinated Diagnostic Agents] Anaphylaxis and Shortness Of Breath    Needed to be defibrillated and patient was pre-medicated with radiology 13 hour premeds.   . Iohexol Anaphylaxis     Code: RASH, Desc: PT STATES SHE IS ALLERGIC TO IV CONTRAST 05/28/06/RM, Onset Date: [id number]   . Augmentin [Amoxicillin-Pot Clavulanate] Other (See Comments)    "stomach hurt"  . Codeine Nausea And  Vomiting    jittery  . Demerol [Meperidine] Other (See Comments)    dysphoria  . Morphine And Related Nausea And Vomiting  . Lantus [Insulin Glargine]     Patient reports itching    Social History   Tobacco Use  . Smoking status: Never Smoker  . Smokeless tobacco: Never Used  Substance Use Topics  . Alcohol use: Not Currently    Alcohol/week: 0.0 standard drinks    Comment: None since 2015-was occasional    Family History  Problem Relation Age of Onset  . Heart attack Mother   . Lung cancer Maternal Uncle        x2  . Breast cancer Maternal Aunt        x2  . Stomach cancer Paternal Uncle   . Stomach cancer Paternal Grandfather   . Stomach cancer Paternal Grandmother   . Diabetes Father   . Positive PPD/TB Exposure Father   . Glaucoma Father   . Esophageal cancer Paternal Aunt   . Colon cancer Maternal Grandfather       Review of Systems  Constitutional: negative for fatigue and weight loss Respiratory: negative for cough and wheezing Cardiovascular: negative for chest pain, fatigue and palpitations Gastrointestinal: negative for abdominal pain and change in bowel habits Musculoskeletal:negative for myalgias Neurological: negative for gait problems and tremors Behavioral/Psych: negative for abusive relationship, depression Endocrine: negative for temperature intolerance    Genitourinary:negative for abnormal menstrual periods, genital lesions, hot flashes, sexual problems.  Positive for vaginal discharge Integument/breast: negative for breast lump, breast tenderness, nipple discharge and skin lesion(s)    Objective:       BP (!) 147/96   Pulse 65   Ht 5\' 7"  (1.702 m)   Wt 185 lb (83.9 kg)   LMP 07/23/2014   BMI 28.98 kg/m  General:   alert and no distress  Skin:   no rash or abnormalities  Lungs:   clear to auscultation bilaterally  Heart:   regular rate and rhythm, S1, S2 normal,  no murmur, click, rub or gallop  Breasts:   normal without suspicious  masses, skin or nipple changes or axillary nodes  Abdomen:  normal findings: no organomegaly, soft, non-tender and no hernia  Pelvis:  External genitalia: normal general appearance Urinary system: urethral meatus normal and bladder without fullness, nontender Vaginal: normal without tenderness, induration or masses Cervix: absent Adnexa: normal bimanual exam Uterus: absent   Lab Review Urine pregnancy test Labs reviewed yes Radiologic studies reviewed yes  I have spent a total of 20 minutes of face-to-face time, excluding clinical staff time, reviewing notes and preparing to see patient, ordering tests and/or medications, and counseling the patient.  Assessment:     1. Encounter for gynecological examination  2. H/O abdominal hysterectomy / bilateral salpingectomy for fibroids  3. Vaginal discharge Rx: - Cervicovaginal ancillary only( [place])  4. Candida vaginitis Rx: - fluconazole (DIFLUCAN) 150 MG tablet; Take 1 tablet (150 mg total) by mouth once for 1 dose.  Dispense: 1 tablet; Refill: 0  5. Screening for STD (sexually transmitted disease) Rx: - RPR+HBsAg+HCVAb+...  6. Screening breast examination Rx: - MM Digital Screening; Future  7. Vitamin deficiency Rx: - Prenat-Fe Poly-Methfol-FA-DHA (VITAFOL ULTRA) 29-0.6-0.4-200 MG CAPS; Take 1 capsule by mouth daily before breakfast.  Dispense: 30 capsule; Refill: 11    Plan:    Education reviewed: calcium supplements, depression evaluation, low fat, low cholesterol diet, safe sex/STD prevention, self breast exams and weight bearing exercise. Contraception: status post hysterectomy. Mammogram ordered. Follow up in: 2 weeks.   Meds ordered this encounter  Medications  . fluconazole (DIFLUCAN) 150 MG tablet    Sig: Take 1 tablet (150 mg total) by mouth once for 1 dose.    Dispense:  1 tablet    Refill:  0  . Prenat-Fe Poly-Methfol-FA-DHA (VITAFOL ULTRA) 29-0.6-0.4-200 MG CAPS    Sig: Take 1 capsule by mouth  daily before breakfast.    Dispense:  30 capsule    Refill:  11   Orders Placed This Encounter  Procedures  . MM Digital Screening    Standing Status:   Future    Standing Expiration Date:   08/08/2021    Order Specific Question:   Reason for Exam (SYMPTOM  OR DIAGNOSIS REQUIRED)    Answer:   Screening    Order Specific Question:   Is the patient pregnant?    Answer:   No    Order Specific Question:   Preferred imaging location?    Answer:   [facility]  . RPR+HBsAg+HCVAb+...   Need to obtain previous records  Shelly Bombard, MD 08/08/2020 10:04 AM

## 2020-08-09 LAB — RPR+HBSAG+HCVAB+...
HIV Screen 4th Generation wRfx: NONREACTIVE
Hep C Virus Ab: <0.1 s/co ratio (ref 0.0–0.9)
Hepatitis B Surface Ag: NEGATIVE
RPR Ser Ql: NONREACTIVE

## 2020-08-12 LAB — CERVICOVAGINAL ANCILLARY ONLY
Bacterial Vaginitis (gardnerella): POSITIVE — AB
Candida Glabrata: NEGATIVE
Candida Vaginitis: NEGATIVE
Comment: NEGATIVE
Comment: NEGATIVE
Comment: NEGATIVE
Comment: NEGATIVE
Trichomonas: NEGATIVE

## 2020-08-13 ENCOUNTER — Other Ambulatory Visit: Payer: Self-pay | Admitting: Obstetrics

## 2020-08-13 DIAGNOSIS — B9689 Other specified bacterial agents as the cause of diseases classified elsewhere: Secondary | ICD-10-CM

## 2020-08-13 DIAGNOSIS — N760 Acute vaginitis: Secondary | ICD-10-CM

## 2020-08-13 MED ORDER — CLINDAMYCIN HCL 300 MG PO CAPS: 300.0000 mg | ORAL_CAPSULE | Freq: Three times a day (TID) | ORAL | 0 refills | Status: DC

## 2020-08-13 MED ORDER — CLINDAMYCIN PHOSPHATE 1 % EX GEL
Freq: Two times a day (BID) | CUTANEOUS | 11 refills | Status: DC
Start: 2020-08-13 — End: 2020-08-13

## 2020-08-19 ENCOUNTER — Ambulatory Visit (INDEPENDENT_AMBULATORY_CARE_PROVIDER_SITE_OTHER): Payer: Medicaid Other

## 2020-08-19 DIAGNOSIS — I4581 Long QT syndrome: Secondary | ICD-10-CM

## 2020-08-20 LAB — CUP PACEART REMOTE DEVICE CHECK
Battery Remaining Longevity: 81 mo
Battery Voltage: 3 V
Brady Statistic RV Percent Paced: 0.01 %
Date Time Interrogation Session: 20220404022822
HighPow Impedance: 55 Ohm
HighPow Impedance: 84 Ohm
Implantable Lead Implant Date: 20080828
Implantable Lead Location: 753860
Implantable Lead Model: 6947
Implantable Pulse Generator Implant Date: 20161117
Lead Channel Impedance Value: 342 Ohm
Lead Channel Impedance Value: 399 Ohm
Lead Channel Pacing Threshold Amplitude: 1.5 V
Lead Channel Pacing Threshold Pulse Width: 0.4 ms
Lead Channel Sensing Intrinsic Amplitude: 3.75 mV
Lead Channel Sensing Intrinsic Amplitude: 3.75 mV
Lead Channel Setting Pacing Amplitude: 3.25 V
Lead Channel Setting Pacing Pulse Width: 0.4 ms
Lead Channel Setting Sensing Sensitivity: 0.3 mV

## 2020-08-22 ENCOUNTER — Other Ambulatory Visit: Payer: Self-pay

## 2020-08-22 MED ORDER — FLUCONAZOLE 150 MG PO TABS: 150.0000 mg | ORAL_TABLET | Freq: Once | ORAL | 0 refills | Status: AC

## 2020-08-22 NOTE — Telephone Encounter (Signed)
Refill on diflucan for yeast infection.

## 2020-08-29 NOTE — Progress Notes (Signed)
Remote ICD transmission.   

## 2020-10-17 ENCOUNTER — Other Ambulatory Visit: Payer: Self-pay | Admitting: *Deleted

## 2020-10-17 ENCOUNTER — Telehealth: Payer: Self-pay | Admitting: *Deleted

## 2020-10-17 NOTE — Telephone Encounter (Signed)
Pt called office for refill on Diflucan.  Please review and send if approved. States contraindication when entered for ordering.    If approved, please send to [place].

## 2020-10-17 NOTE — Progress Notes (Signed)
error 

## 2020-10-25 ENCOUNTER — Other Ambulatory Visit: Payer: Self-pay

## 2020-10-25 DIAGNOSIS — B3731 Acute candidiasis of vulva and vagina: Secondary | ICD-10-CM

## 2020-10-25 MED ORDER — FLUCONAZOLE 150 MG PO TABS: 150.0000 mg | ORAL_TABLET | Freq: Once | ORAL | 0 refills | Status: DC

## 2020-10-25 NOTE — Progress Notes (Signed)
Pt confirmed she can take Rx Diflucan Rx sent per protocol. Consulted w/Dr.Harper ok to send

## 2020-11-19 ENCOUNTER — Ambulatory Visit (INDEPENDENT_AMBULATORY_CARE_PROVIDER_SITE_OTHER): Payer: Medicaid Other

## 2020-11-19 DIAGNOSIS — I4581 Long QT syndrome: Secondary | ICD-10-CM

## 2020-11-20 LAB — CUP PACEART REMOTE DEVICE CHECK
Battery Remaining Longevity: 70 mo
Battery Voltage: 2.96 V
Brady Statistic RV Percent Paced: 0.01 %
Date Time Interrogation Session: 20220701090208
HighPow Impedance: 49 Ohm
HighPow Impedance: 74 Ohm
Implantable Lead Implant Date: 20080828
Implantable Lead Location: 753860
Implantable Lead Model: 6947
Implantable Pulse Generator Implant Date: 20161117
Lead Channel Impedance Value: 323 Ohm
Lead Channel Impedance Value: 380 Ohm
Lead Channel Pacing Threshold Amplitude: 1.5 V
Lead Channel Pacing Threshold Pulse Width: 0.4 ms
Lead Channel Sensing Intrinsic Amplitude: 3.625 mV
Lead Channel Sensing Intrinsic Amplitude: 3.625 mV
Lead Channel Setting Pacing Amplitude: 3 V
Lead Channel Setting Pacing Pulse Width: 0.4 ms
Lead Channel Setting Sensing Sensitivity: 0.3 mV

## 2020-12-10 NOTE — Progress Notes (Signed)
Remote ICD transmission.   

## 2020-12-11 ENCOUNTER — Other Ambulatory Visit: Payer: Self-pay

## 2020-12-11 DIAGNOSIS — B3731 Acute candidiasis of vulva and vagina: Secondary | ICD-10-CM

## 2020-12-11 DIAGNOSIS — B373 Candidiasis of vulva and vagina: Secondary | ICD-10-CM

## 2020-12-11 MED ORDER — FLUCONAZOLE 150 MG PO TABS: 150.0000 mg | ORAL_TABLET | Freq: Once | ORAL | 0 refills | Status: AC

## 2020-12-12 ENCOUNTER — Telehealth: Payer: Self-pay | Admitting: Gastroenterology

## 2020-12-12 MED ORDER — LINACLOTIDE 290 MCG PO CAPS: 290.0000 ug | ORAL_CAPSULE | Freq: Every day | ORAL | 0 refills | Status: DC

## 2020-12-12 NOTE — Telephone Encounter (Signed)
Inbound call from patient requesting refill for linzess.

## 2020-12-12 NOTE — Telephone Encounter (Signed)
Patient has an appointment on 8-25 with Tye Savoy. Refill sent for 30 days

## 2021-01-09 ENCOUNTER — Other Ambulatory Visit (INDEPENDENT_AMBULATORY_CARE_PROVIDER_SITE_OTHER): Payer: Medicaid Other

## 2021-01-09 ENCOUNTER — Ambulatory Visit: Payer: Medicaid Other | Admitting: Nurse Practitioner

## 2021-01-09 ENCOUNTER — Encounter: Payer: Self-pay | Admitting: Nurse Practitioner

## 2021-01-09 VITALS — BP 132/70 | HR 64 | Ht 66.0 in | Wt 178.1 lb

## 2021-01-09 DIAGNOSIS — D8689 Sarcoidosis of other sites: Secondary | ICD-10-CM | POA: Diagnosis not present

## 2021-01-09 DIAGNOSIS — K5909 Other constipation: Secondary | ICD-10-CM | POA: Diagnosis not present

## 2021-01-09 DIAGNOSIS — R1032 Left lower quadrant pain: Secondary | ICD-10-CM

## 2021-01-09 DIAGNOSIS — Z86010 Personal history of colonic polyps: Secondary | ICD-10-CM

## 2021-01-09 LAB — HEPATIC FUNCTION PANEL
ALT: 22 U/L (ref 0–35)
AST: 21 U/L (ref 0–37)
Albumin: 4 g/dL (ref 3.5–5.2)
Alkaline Phosphatase: 105 U/L (ref 39–117)
Bilirubin, Direct: 0.1 mg/dL (ref 0.0–0.3)
Total Bilirubin: 0.6 mg/dL (ref 0.2–1.2)
Total Protein: 7.8 g/dL (ref 6.0–8.3)

## 2021-01-09 LAB — PROTIME-INR
INR: 1 ratio (ref 0.8–1.0)
Prothrombin Time: 11.5 s (ref 9.6–13.1)

## 2021-01-09 MED ORDER — SUTAB 1479-225-188 MG PO TABS: 1.0000 | ORAL_TABLET | Freq: Once | ORAL | 0 refills | Status: AC

## 2021-01-09 NOTE — Progress Notes (Signed)
ASSESSMENT AND PLAN     # 50 yo female with chronic constipation, doing well since resuming Linzess 290 mcg daily ( had run out of refill).  --Continue Linzess  # History of colon polyps. Due for surveilance colonoscopy.  --The risks and benefits of colonoscopy with possible polypectomy / biopsies were discussed and the patient agrees to proceed.    # Chronic LLQ pain. This was addressed at last visit in April 2021. CTAP recommended but not done. Adhesive disease? Musculoskeletal pain ( equivocal Carnett's sign) --Given the persistent LLQ pain will proceed with CTAP (without contrast given contrast allergy).  # Hepatic / Pulmonary sarcoidosis ( Dr. Concepcion Living) . No evidence of cirrhosis on prior biopsy although did have some fibrotic changes.  LFTs , platelets and and INR historically normal.  --No recent liver tests in Epic. Obtain LFTs, INR.  --Further assessment of liver at time of CTAP ( though may be limited without IV contrast).  --Will notify patient of results  # Long QT syndrome    HISTORY OF PRESENT ILLNESS    Chief Complaint :constipation   [name] is a 50 y.o. female known to Dr. Havery Moros  with a past medical history significant for hepatic / pulmonary sarcoidosis, long QT syndrome s/p ICD, SVT s/p ablation, CVA, seizures , DM IBS, fibromyalgia . See PMH below for any additional medical problems.     Patient comes in for evaluation of constipation which has actually resolved.  She had run out of Linzess but since we refilled it she has been doing well.  Patient does mention the presence of left lower quadrant pain.  A few years back this was more in the left inguinal area but now localized to LLQ. She says the pain has been there since fibroid surgery/partial hysterectomy in 2016.  She inquires about a hernia in the area.  The pain is not related to eating, not improved with bowel movements.  Sometimes she feels a "pulling sensation" in the area.  No urinary  symptoms.  Patient has a history of seizures which she describes as staring.  Patient does not think that she has had any recent seizure activity.  She is maintained on Lamictal and Keppra  Past Medical History:  Diagnosis Date   Abdominal pain, periumbilic    Allergic rhinitis    Allergy    Anemia    Anxiety    Arthritis    Asthma    Automatic implantable cardioverter-defibrillator in situ 2006, replaced 2008   MEDTRONIC   Bronchitis    Cancer ([place])    Nodules in lungs pt believes were cancerous pt unsure   COPD (chronic obstructive pulmonary disease) ([place])    DM (diabetes mellitus) ([place]) 1997/1998   type 1, initially gestational but soon after child birth developed IDDM   Esophageal stricture 2005/2009   esophageal strictures dilated 2005, 2009   Eye hemorrhage    bilateral   Fibroid, uterine    Fibromyalgia    GERD (gastroesophageal reflux disease) 2005   History of blood transfusion years ago   Hypertension    Internal hemorrhoids 2010   2011 band ligation of int rrhoids.   Long Q-T syndrome    Neuropathy in diabetes [facility])    Palpitation    Sarcoidosis    Seizure disorder ([place])    none recently   Stroke ([place])    QUESTIONABLE TIA     Current Medications, Allergies, Past Surgical History, Family History and Social History were  reviewed in [facility] record.   Current Outpatient Medications  Medication Sig Dispense Refill   Accu-Chek Softclix Lancets lancets Use as instructed 100 each 12   acetaminophen (TYLENOL) 500 MG tablet Take 1,000 mg by mouth every 6 (six) hours as needed for mild pain or fever. Reported on 07/22/2015     aspirin EC 81 MG tablet Take 1 tablet (81 mg total) by mouth daily. 30 tablet 11   baclofen (LIORESAL) 10 MG tablet Take 10 mg by mouth as needed for muscle spasms.     beclomethasone (QVAR) 80 MCG/ACT inhaler Inhale 2 puffs into the lungs 2 (two) times daily. 1 Inhaler 5   Camphor-Menthol-Methyl Sal (SALONPAS)  3.05-23-08 % PTCH Apply 1 patch topically daily as needed (pain).     cycloSPORINE (RESTASIS) 0.05 % ophthalmic emulsion Place 1 drop into both eyes daily.     fluticasone (FLONASE) 50 MCG/ACT nasal spray Place 2 sprays into both nostrils daily as needed for allergies.      gabapentin (NEURONTIN) 300 MG capsule Take 1 capsule (300 mg total) by mouth 3 (three) times daily. 90 capsule 2   glucose blood (ACCU-CHEK GUIDE) test strip Check sugars four times daily 100 each 12   Insulin Pen Needle 31G X 8 MM MISC BD UltraFine III Pen Needles. For use with insulin pen device. Inject insulin 6 x daily 200 each 3   lamoTRIgine (LAMICTAL) 150 MG tablet Take 1 tablet (150 mg total) by mouth 2 (two) times daily. 180 tablet 3   Lancet Devices (ACCU-CHEK SOFTCLIX) lancets Use as instructed 1 each 0   levETIRAcetam (KEPPRA) 500 MG tablet Take 1 tablet (500 mg total) by mouth 2 (two) times daily. 180 tablet 3   linaclotide (LINZESS) 290 MCG CAPS capsule Take 1 capsule (290 mcg total) by mouth daily before breakfast. Please keep your August appointment for further refills. Thank you 30 capsule 0   metFORMIN (GLUCOPHAGE-XR) 500 MG 24 hr tablet Take 1 tablet (500 mg total) by mouth daily with breakfast. 30 tablet 2   NOVOLOG FLEXPEN 100 UNIT/ML FlexPen 6-10 units three times per day with meals Use 10 units for Sugars > 160 Use 8 units for sugars 150-160 Use 6 units for sugars <150 15 mL 3   Olopatadine HCl 0.7 % SOLN Place 1 drop into both eyes daily as needed (eye allergies).      pantoprazole (PROTONIX) 20 MG tablet Take 1 tablet (20 mg total) by mouth daily as needed. 90 tablet 3   potassium chloride SA (K-DUR,KLOR-CON) 20 MEQ tablet Take 1 tablet (20 mEq total) by mouth daily. 30 tablet 2   Prenat-Fe Poly-Methfol-FA-DHA (VITAFOL ULTRA) 29-0.6-0.4-200 MG CAPS Take 1 capsule by mouth daily before breakfast. 30 capsule 11   propranolol (INDERAL) 80 MG tablet Take 1 tablet (80 mg total) by mouth 2 (two) times daily. 180  tablet 3   TRESIBA FLEXTOUCH 200 UNIT/ML FlexTouch Pen ADMINISTER 60 UNITS UNDER THE SKIN DAILY AT 6 AM 9 mL 5   naproxen (NAPROSYN) 375 MG tablet Take 1 tablet (375 mg total) by mouth 2 (two) times daily with a meal. (Patient not taking: No sig reported) 60 tablet 3   No current facility-administered medications for this visit.    Review of Systems: No chest pain. No shortness of breath. No urinary complaints.   PHYSICAL EXAM :    Wt Readings from Last 3 Encounters:  01/09/21 178 lb 2 oz (80.8 kg)  08/08/20 185 lb (83.9 kg)  03/14/20 187 lb 3.2 oz (84.9 kg)    BP 132/70 (BP Location: Left Arm, Patient Position: Sitting, Cuff Size: Normal)   Pulse 64   Ht '5\' 6"'$  (1.676 m) Comment: height measured without shoes  Wt 178 lb 2 oz (80.8 kg)   LMP 07/23/2014   BMI 28.75 kg/m  Constitutional:  Pleasant female in no acute distress. Psychiatric: Normal mood and affect. Behavior is normal. EENT: Pupils normal.  Conjunctivae are normal. No scleral icterus. Neck supple.  Cardiovascular: Normal rate, regular rhythm. No edema Pulmonary/chest: Effort normal and breath sounds normal. No wheezing, rales or rhonchi. Abdominal: Soft, nondistended, nontender. Bowel sounds active throughout. There are no masses palpable.  It was difficult for her to do a sit up to engage abdominal muscles but attempted sit up did partially reproduce the LLQ discomfort  Neurological: Alert and oriented to person place and time. Skin: Skin is warm and dry. No rashes noted.  Tye Savoy, NP  01/09/2021, 11:28 AM

## 2021-01-09 NOTE — Patient Instructions (Addendum)
If you are age 49 or older, your body mass index should be between 23-30. Your Body mass index is 28.75 kg/m. If this is out of the aforementioned range listed, please consider follow up with your Primary Care Provider.  If you are age 23 or younger, your body mass index should be between 19-25. Your Body mass index is 28.75 kg/m. If this is out of the aformentioned range listed, please consider follow up with your Primary Care Provider.   __________________________________________________________  The [place] [facility] providers would like to encourage you to use [facility] to communicate with providers for non-urgent requests or questions.  Due to long hold times on the telephone, sending your provider a message by [facility] may be a faster and more efficient way to get a response.  Please allow 48 business hours for a response.  Please remember that this is for non-urgent requests.   You have been scheduled for a colonoscopy. Please follow written instructions given to you at your visit today.  Please pick up your prep supplies at the pharmacy within the next 1-3 days. If you use inhalers (even only as needed), please bring them with you on the day of your procedure.  You will be contacted by [facility] in the next 2 days to arrange a CT of Abdomen and Pelvis without IV contrast.  The number on your caller ID will be [phone], please answer when they call.  If you have not heard from them in 2 days please call [phone] to schedule.     Please go to the lab in the basement of our building to have lab work done as you leave today. Hit "B" for basement when you get on the elevator.  When the doors open the lab is on your left.  We will call you with the results. Thank you.  Thank you for entrusting me with your care and for choosing [facility], Tye Savoy, NP-C

## 2021-01-09 NOTE — Progress Notes (Signed)
Agree with assessment and plan as outlined.  

## 2021-01-16 ENCOUNTER — Ambulatory Visit (HOSPITAL_COMMUNITY)
Admission: RE | Admit: 2021-01-16 | Discharge: 2021-01-16 | Disposition: A | Discharge status: Home / Self Care | Payer: Medicaid Other | Source: Ambulatory Visit | Attending: Nurse Practitioner | Admitting: Nurse Practitioner

## 2021-01-16 ENCOUNTER — Other Ambulatory Visit: Payer: Self-pay

## 2021-01-16 DIAGNOSIS — K5900 Constipation, unspecified: Secondary | ICD-10-CM | POA: Diagnosis not present

## 2021-01-16 DIAGNOSIS — K5909 Other constipation: Secondary | ICD-10-CM | POA: Diagnosis not present

## 2021-01-16 DIAGNOSIS — R1032 Left lower quadrant pain: Secondary | ICD-10-CM | POA: Diagnosis not present

## 2021-01-16 DIAGNOSIS — D8689 Sarcoidosis of other sites: Secondary | ICD-10-CM | POA: Insufficient documentation

## 2021-01-16 DIAGNOSIS — I10 Essential (primary) hypertension: Secondary | ICD-10-CM | POA: Diagnosis not present

## 2021-01-16 DIAGNOSIS — Z86010 Personal history of colonic polyps: Secondary | ICD-10-CM | POA: Insufficient documentation

## 2021-01-16 DIAGNOSIS — D869 Sarcoidosis, unspecified: Secondary | ICD-10-CM | POA: Diagnosis not present

## 2021-01-16 DIAGNOSIS — E119 Type 2 diabetes mellitus without complications: Secondary | ICD-10-CM | POA: Diagnosis not present

## 2021-01-27 ENCOUNTER — Telehealth: Payer: Self-pay | Admitting: Internal Medicine

## 2021-01-27 ENCOUNTER — Telehealth: Payer: Self-pay

## 2021-01-27 NOTE — Telephone Encounter (Signed)
-----   Message from Yetta Flock, MD sent at 01/27/2021  9:19 AM EDT ----- Regarding: RE: LEC pt John I understand. We will work on this.  Isobelle Tuckett can you please reach out to this patient's cardiology office regarding clearance for endoscopic procedure. She is scheduled with [place] this Friday. They may not have enough time to provide clearance for her and in that case she may need to be rescheduled. In the off chance they can do it this week we can proceed, but understand if they need more time. Can you please also let the patient know? Thanks  Dr. Loni Muse ----- Message ----- From: Osvaldo Angst, CRNA Sent: 01/26/2021   6:16 PM EDT To: Yetta Flock, MD, Roetta Sessions, CMA Subject: LEC pt                                         Dr. Havery Moros,  This pt has an extensive cardiac hx and a number of co morbidities.  It would be prudent to obtain a cardiac clearance prior to her LEC procedure.  Thanks,  Osvaldo Angst

## 2021-01-27 NOTE — Telephone Encounter (Signed)
Will route over to Gracy Bruins, EP Scheduler for an appointment.

## 2021-01-27 NOTE — Telephone Encounter (Signed)
Spoke with Selena at [facility], she has initiated a medical clearance request and has sent this to the provider for their recommendations. Will await response.   Lm on vm for patient to return call.

## 2021-01-27 NOTE — Telephone Encounter (Signed)
   [facility] Pre-operative Risk Assessment    Patient Name: [name]  DOB: 27-Feb-1971 MRN: [id number]  HEARTCARE STAFF:  - IMPORTANT!!!!!! Under Visit Info/Reason for Call, type in Other and utilize the format Clearance MM/DD/YY or Clearance TBD. Do not use dashes or single digits. - Please review there is not already an duplicate clearance open for this procedure. - If request is for dental extraction, please clarify the # of teeth to be extracted. - If the patient is currently at the dentist's office, call Pre-Op Callback Staff (MA/nurse) to input urgent request.  - If the patient is not currently in the dentist office, please route to the Pre-Op pool.  Request for surgical clearance:  What type of surgery is being performed? colonoscopy  When is this surgery scheduled? 01/31/2021  What type of clearance is required (medical clearance vs. Pharmacy clearance to hold med vs. Both)? medical  Are there any medications that need to be held prior to surgery and how long? no  Practice name and name of physician performing surgery? [facility], Dr. Havery Moros  What is the office phone number? [phone]   7.   What is the office fax number? [phone]  8.   Anesthesia type (None, local, MAC, general) ? MAC   Selena Zobro 01/27/2021, 1:09 PM  _________________________________________________________________   (provider comments below)

## 2021-01-27 NOTE — Telephone Encounter (Signed)
   Name: KARLIYAH KRAUSSE  DOB: [birth year]  MRN: [id number]  Primary Cardiologist: Cristopher Peru, MD  Chart reviewed as part of pre-operative protocol coverage. Because of Sakena Wehrs Plasse's past medical history and time since last visit, she will require a follow-up visit in order to better assess preoperative cardiovascular risk.  Pre-op covering staff: - Please schedule appointment and call patient to inform them. If patient already had an upcoming appointment within acceptable timeframe, please add "pre-op clearance" to the appointment notes so provider is aware. - Please contact requesting surgeon's office via preferred method (i.e, phone, fax) to inform them of need for appointment prior to surgery.  If applicable, this message will also be routed to pharmacy pool and/or primary cardiologist for input on holding anticoagulant/antiplatelet agent as requested below so that this information is available to the clearing provider at time of patient's appointment.   Tami Lin Mahonri Seiden, PA  01/27/2021, 3:41 PM

## 2021-01-29 NOTE — Telephone Encounter (Signed)
Lm on vm for patient to return call 

## 2021-01-30 NOTE — Telephone Encounter (Signed)
Lm on vm for patient to return call.   [place] appt for 9/16 has been cancelled.

## 2021-01-31 ENCOUNTER — Encounter: Payer: Self-pay | Admitting: Internal Medicine

## 2021-01-31 ENCOUNTER — Encounter: Payer: Medicaid Other | Admitting: Gastroenterology

## 2021-01-31 NOTE — Telephone Encounter (Signed)
Pt is scheduled for an appointment, Tuesday, 02/04/21 and clearance will be addressed at that time. Will route back to the requesting surgeon's office to make them aware.

## 2021-01-31 NOTE — Telephone Encounter (Signed)
Per Gracy Bruins, have contacted the pt 01/27/21 & 01/29/2021 and not return call.

## 2021-02-03 NOTE — Progress Notes (Signed)
Electrophysiology Office Note Date: 02/04/2021  ID:  Anastassia, Noack 07/08/1970, MRN [id number]  PCP: Zola Button, MD Primary Cardiologist: Cristopher Peru, MD Electrophysiologist: Cristopher Peru, MD   CC: Routine ICD follow-up and cardiac clearance for colonoscopy  Loretta LEFEVERS is a 50 y.o. female seen today for Cristopher Peru, MD for cardiac clearance.  Since last being seen in our clinic the patient reports doing very well.  she denies chest pain, palpitations, dyspnea, PND, orthopnea, nausea, vomiting, dizziness, syncope, edema, weight gain, or early satiety. She has not had ICD shocks.   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   Past Medical History:  Diagnosis Date   Abdominal pain, periumbilic    Allergic rhinitis    Allergy    Anemia    Anxiety    Arthritis    Asthma    Automatic implantable cardioverter-defibrillator in situ 2006, replaced 2008   MEDTRONIC   Bronchitis    Cancer ([place])    Nodules in lungs pt believes were cancerous pt unsure   COPD (chronic obstructive pulmonary disease) ([place])    DM (diabetes mellitus) ([place]) 1997/1998   type 1, initially gestational but soon after child birth developed IDDM   Esophageal stricture 2005/2009   esophageal strictures dilated 2005, 2009   Eye hemorrhage    bilateral   Fibroid, uterine    Fibromyalgia    GERD (gastroesophageal reflux disease) 2005   History of blood transfusion years ago   Hypertension    Internal hemorrhoids 2010   2011 band ligation of int rrhoids.   Long Q-T syndrome    Neuropathy in diabetes ([place])    Palpitation    Sarcoidosis    Seizure disorder ([place])    none recently   Stroke ([place])    QUESTIONABLE TIA    Past Surgical History:  Procedure Laterality Date   AUTOMATIC IMPLANTABLE CARDIAC DEFIBRILLATOR SITU  2006,    ICD-Medtronic   Remote - Yes    CERVIX REMOVAL     DILITATION & CURRETTAGE/HYSTROSCOPY WITH HYDROTHERMAL  ABLATION N/A 06/07/2013   Procedure: DILATATION & CURETTAGE/HYSTEROSCOPY WITH attempted HYDROTHERMAL ABLATION;  Surgeon: Frederico Hamman, MD;  Location: [facility];  Service: Gynecology;  Laterality: N/A;   EP IMPLANTABLE DEVICE N/A 04/04/2015   Procedure:  ICD Generator Changeout;  Surgeon: Evans Lance, MD;  Location: [facility];  Service: Cardiovascular;  Laterality: N/A;   ESOPHAGOGASTRODUODENOSCOPY (EGD) WITH PROPOFOL N/A 10/24/2014   Procedure: ESOPHAGOGASTRODUODENOSCOPY (EGD) WITH PROPOFOL;  Surgeon: Inda Castle, MD;  Location: [facility];  Service: Endoscopy;  Laterality: N/A;   EXPLORATORY LAPAROTOMY  11/06/14   evacuation of post TAH/BSO hematoma   HAND SURGERY Right    x 2   HEMORRHOID BANDING  03/2010   SALPINGECTOMY     SVT ablation     TOTAL ABDOMINAL HYSTERECTOMY W/ BILATERAL SALPINGOOPHORECTOMY  11/06/14   at [facility]. for menorrhagia, pelvic pain and enlarging uterine fibroids   TUBAL LIGATION     VIDEO BRONCHOSCOPY Bilateral 01/24/2015   Procedure: VIDEO BRONCHOSCOPY WITHOUT FLUORO;  Surgeon: Marshell Garfinkel, MD;  Location: [facility];  Service: Cardiopulmonary;  Laterality: Bilateral;    Current Outpatient Medications  Medication Sig Dispense Refill   Accu-Chek Softclix Lancets lancets Use as instructed 100 each 12   acetaminophen (TYLENOL) 500 MG tablet Take 1,000 mg by mouth every 6 (six) hours as needed for mild pain or fever. Reported on 07/22/2015  aspirin EC 81 MG tablet Take 1 tablet (81 mg total) by mouth daily. 30 tablet 11   baclofen (LIORESAL) 10 MG tablet Take 10 mg by mouth as needed for muscle spasms.     beclomethasone (QVAR) 80 MCG/ACT inhaler Inhale 2 puffs into the lungs 2 (two) times daily. 1 Inhaler 5   Camphor-Menthol-Methyl Sal (SALONPAS) 3.05-23-08 % PTCH Apply 1 patch topically daily as needed (pain).     cycloSPORINE (RESTASIS) 0.05 % ophthalmic emulsion Place 1 drop into both eyes daily.     fluticasone (FLONASE) 50 MCG/ACT nasal spray  Place 2 sprays into both nostrils daily as needed for allergies.      gabapentin (NEURONTIN) 300 MG capsule Take 1 capsule (300 mg total) by mouth 3 (three) times daily. 90 capsule 2   glucose blood (ACCU-CHEK GUIDE) test strip Check sugars four times daily 100 each 12   Insulin Pen Needle 31G X 8 MM MISC BD UltraFine III Pen Needles. For use with insulin pen device. Inject insulin 6 x daily 200 each 3   lamoTRIgine (LAMICTAL) 150 MG tablet Take 1 tablet (150 mg total) by mouth 2 (two) times daily. 180 tablet 3   Lancet Devices (ACCU-CHEK SOFTCLIX) lancets Use as instructed 1 each 0   levETIRAcetam (KEPPRA) 500 MG tablet Take 1 tablet (500 mg total) by mouth 2 (two) times daily. 180 tablet 3   linaclotide (LINZESS) 290 MCG CAPS capsule Take 1 capsule (290 mcg total) by mouth daily before breakfast. Please keep your August appointment for further refills. Thank you 30 capsule 0   naproxen (NAPROSYN) 375 MG tablet Take 1 tablet (375 mg total) by mouth 2 (two) times daily with a meal. (Patient taking differently: Take 375 mg by mouth as needed.) 60 tablet 3   NOVOLOG FLEXPEN 100 UNIT/ML FlexPen 6-10 units three times per day with meals Use 10 units for Sugars > 160 Use 8 units for sugars 150-160 Use 6 units for sugars <150 15 mL 3   Olopatadine HCl 0.7 % SOLN Place 1 drop into both eyes daily as needed (eye allergies).      pantoprazole (PROTONIX) 20 MG tablet Take 1 tablet (20 mg total) by mouth daily as needed. 90 tablet 3   potassium chloride SA (K-DUR,KLOR-CON) 20 MEQ tablet Take 1 tablet (20 mEq total) by mouth daily. 30 tablet 2   Prenat-Fe Poly-Methfol-FA-DHA (VITAFOL ULTRA) 29-0.6-0.4-200 MG CAPS Take 1 capsule by mouth daily before breakfast. 30 capsule 11   propranolol (INDERAL) 80 MG tablet Take 1 tablet (80 mg total) by mouth 2 (two) times daily. 180 tablet 3   SUTAB [id number] MG TABS Take by mouth every morning.     TRESIBA FLEXTOUCH 200 UNIT/ML FlexTouch Pen ADMINISTER 60 UNITS UNDER  THE SKIN DAILY AT 6 AM 9 mL 5   metFORMIN (GLUCOPHAGE-XR) 500 MG 24 hr tablet Take 1 tablet (500 mg total) by mouth daily with breakfast. 30 tablet 2   No current facility-administered medications for this visit.    Allergies:   Contrast media [iodinated diagnostic agents], Iohexol, Augmentin [amoxicillin-pot clavulanate], Codeine, Demerol [meperidine], Morphine and related, and Lantus [insulin glargine]   Social History: Social History   Socioeconomic History   Marital status: Single    Spouse name: Not on file   Number of children: 3   Years of education: Not on file   Highest education level: Not on file  Occupational History   Occupation: disabled  Tobacco Use   Smoking status: Never  Smokeless tobacco: Never  Vaping Use   Vaping Use: Never used  Substance and Sexual Activity   Alcohol use: Not Currently    Alcohol/week: 0.0 standard drinks    Comment: None since 2015-was occasional   Drug use: Never   Sexual activity: Yes    Birth control/protection: Condom  Other Topics Concern   Not on file  Social History Narrative   Right handed    Social Determinants of Health   Financial Resource Strain: Not on file  Food Insecurity: Not on file  Transportation Needs: Not on file  Physical Activity: Not on file  Stress: Not on file  Social Connections: Not on file  Intimate Partner Violence: Not on file    Family History: Family History  Problem Relation Age of Onset   Heart attack Mother    Lung cancer Maternal Uncle        x2   Breast cancer Maternal Aunt        x2   Stomach cancer Paternal Uncle    Stomach cancer Paternal Grandfather    Stomach cancer Paternal Grandmother    Diabetes Father    Positive PPD/TB Exposure Father    Glaucoma Father    Esophageal cancer Paternal Aunt    Colon cancer Maternal Grandfather     Review of Systems: All other systems reviewed and are otherwise negative except as noted above.   Physical Exam: Vitals:   02/04/21  1223  BP: 118/74  Pulse: 64  SpO2: 96%  Weight: 182 lb 9.6 oz (82.8 kg)  Height: 5\' 6"  (1.676 m)     GEN- The patient is well appearing, alert and oriented x 3 today.   HEENT: normocephalic, atraumatic; sclera clear, conjunctiva pink; hearing intact; oropharynx clear; neck supple, no JVP Lymph- no cervical lymphadenopathy Lungs- Clear to ausculation bilaterally, normal work of breathing.  No wheezes, rales, rhonchi Heart- Regular rate and rhythm, no murmurs, rubs or gallops, PMI not laterally displaced GI- soft, non-tender, non-distended, bowel sounds present, no hepatosplenomegaly Extremities- no clubbing or cyanosis. No edema; DP/PT/radial pulses 2+ bilaterally MS- no significant deformity or atrophy Skin- warm and dry, no rash or lesion; ICD pocket well healed Psych- euthymic mood, full affect Neuro- strength and sensation are intact  ICD interrogation- reviewed in detail today,  See PACEART report  EKG:  EKG is ordered today. Personal review of EKG ordered today shows NSR at 64 bpm with stable QTc at 455 ms  Recent Labs: 07/13/2020: BUN 7; Creatinine, Ser 0.57; Hemoglobin 14.0; Platelets 251; Potassium 3.8; Sodium 134 01/09/2021: ALT 22   Wt Readings from Last 3 Encounters:  02/04/21 182 lb 9.6 oz (82.8 kg)  01/09/21 178 lb 2 oz (80.8 kg)  08/08/20 185 lb (83.9 kg)     Other studies Reviewed: Additional studies/ records that were reviewed today include: Previous EP office notes.   Assessment and Plan:  1.  VF arrest / Long QT s/p Medtronic single chamber ICD  euvolemic today Stable on an appropriate medical regimen Normal ICD function See Pace Art report No changes today  2. Prolonged QT Continue propranolol EKG today shows NSR with stable intervals Suggested she use [facility] to screen any new medication, in addition to keeping her providers aware.   3. Sarcoidosis No current evidence of cardiac involvement  4. Cardiac Clearance for Colonoscopy Echo  01/2019 LVEF 60-65%. No symptoms at this time to indicate updating Denies s/s ischemia No undue SOB She may proceed without further cardiac work up. Avoid  QT prolonging agents peri-procedurally.   Current medicines are reviewed at length with the patient today.    Labs/ tests ordered today include:  Orders Placed This Encounter  Procedures   Basic metabolic panel   CBC   EKG 12-Lead    Disposition:   Follow up with Dr. Lovena Le in 12 months. Sooner with issues.   Jacalyn Lefevre, PA-C  02/04/2021 12:39 PM  [facility] [place] [address] [phone] (office) [phone] (fax)

## 2021-02-03 NOTE — Telephone Encounter (Signed)
Pt is scheduled for cardiology appt on 02/04/21.

## 2021-02-04 ENCOUNTER — Ambulatory Visit: Payer: Medicaid Other | Admitting: Student

## 2021-02-04 ENCOUNTER — Other Ambulatory Visit: Payer: Self-pay

## 2021-02-04 ENCOUNTER — Encounter: Payer: Self-pay | Admitting: Student

## 2021-02-04 VITALS — BP 118/74 | HR 64 | Ht 66.0 in | Wt 182.6 lb

## 2021-02-04 DIAGNOSIS — Z95810 Presence of automatic (implantable) cardiac defibrillator: Secondary | ICD-10-CM | POA: Diagnosis not present

## 2021-02-04 DIAGNOSIS — Z01810 Encounter for preprocedural cardiovascular examination: Secondary | ICD-10-CM | POA: Diagnosis not present

## 2021-02-04 DIAGNOSIS — D860 Sarcoidosis of lung: Secondary | ICD-10-CM | POA: Diagnosis not present

## 2021-02-04 DIAGNOSIS — I4581 Long QT syndrome: Secondary | ICD-10-CM | POA: Diagnosis not present

## 2021-02-04 DIAGNOSIS — Z01818 Encounter for other preprocedural examination: Secondary | ICD-10-CM

## 2021-02-04 LAB — CUP PACEART INCLINIC DEVICE CHECK
Battery Remaining Longevity: 69 mo
Battery Voltage: 2.99 V
Brady Statistic RV Percent Paced: 0.01 %
Date Time Interrogation Session: 20220920123934
HighPow Impedance: 46 Ohm
HighPow Impedance: 46 Ohm
HighPow Impedance: 72 Ohm
HighPow Impedance: 72 Ohm
Implantable Lead Implant Date: 20080828
Implantable Lead Location: 753860
Implantable Lead Model: 6947
Implantable Pulse Generator Implant Date: 20161117
Lead Channel Impedance Value: 323 Ohm
Lead Channel Impedance Value: 323 Ohm
Lead Channel Impedance Value: 380 Ohm
Lead Channel Impedance Value: 380 Ohm
Lead Channel Pacing Threshold Amplitude: 1.25 V
Lead Channel Pacing Threshold Amplitude: 1.25 V
Lead Channel Pacing Threshold Pulse Width: 0.4 ms
Lead Channel Pacing Threshold Pulse Width: 0.4 ms
Lead Channel Sensing Intrinsic Amplitude: 2.375 mV
Lead Channel Sensing Intrinsic Amplitude: 2.375 mV
Lead Channel Sensing Intrinsic Amplitude: 2.5 mV
Lead Channel Sensing Intrinsic Amplitude: 2.5 mV
Lead Channel Setting Pacing Amplitude: 3 V
Lead Channel Setting Pacing Pulse Width: 0.4 ms
Lead Channel Setting Sensing Sensitivity: 0.3 mV

## 2021-02-04 LAB — CBC
Hematocrit: 37.6 % (ref 34.0–46.6)
Hemoglobin: 13.2 g/dL (ref 11.1–15.9)
MCH: 30.4 pg (ref 26.6–33.0)
MCHC: 35.1 g/dL (ref 31.5–35.7)
MCV: 87 fL (ref 79–97)
Platelets: 239 10*3/uL (ref 150–450)
RBC: 4.34 x10E6/uL (ref 3.77–5.28)
RDW: 11.4 % — ABNORMAL LOW (ref 11.7–15.4)
WBC: 5.5 10*3/uL (ref 3.4–10.8)

## 2021-02-04 LAB — BASIC METABOLIC PANEL
BUN/Creatinine Ratio: 16 (ref 9–23)
BUN: 10 mg/dL (ref 6–24)
CO2: 27 mmol/L (ref 20–29)
Calcium: 9.4 mg/dL (ref 8.7–10.2)
Chloride: 101 mmol/L (ref 96–106)
Creatinine, Ser: 0.63 mg/dL (ref 0.57–1.00)
Glucose: 284 mg/dL — ABNORMAL HIGH (ref 65–99)
Potassium: 4 mmol/L (ref 3.5–5.2)
Sodium: 139 mmol/L (ref 134–144)
eGFR: 109 mL/min/{1.73_m2} (ref >59)

## 2021-02-04 NOTE — Telephone Encounter (Signed)
Yes okay to direct book at [facility] with me for colonoscopy. Cardiology provided clearance. Thanks

## 2021-02-04 NOTE — Patient Instructions (Signed)
Medication Instructions:  Your physician recommends that you continue on your current medications as directed. Please refer to the Current Medication list given to you today.  *If you need a refill on your cardiac medications before your next appointment, please call your pharmacy*   Lab Work: TODAY: BMET, CBC  If you have labs (blood work) drawn today and your tests are completely normal, you will receive your results only by: [place] (if you have MyChart) OR A paper copy in the mail If you have any lab test that is abnormal or we need to change your treatment, we will call you to review the results.   Follow-Up: At [facility], you and your health needs are our priority.  As part of our continuing mission to provide you with exceptional heart care, we have created designated Provider Care Teams.  These Care Teams include your primary Cardiologist (physician) and Advanced Practice Providers (APPs -  Physician Assistants and Nurse Practitioners) who all work together to provide you with the care you need, when you need it.   Your next appointment:   1 year(s)  The format for your next appointment:   In Person  Provider:   You may see Cristopher Peru, MD or one of the following Advanced Practice Providers on your designated Care Team:   Tommye Standard, [place] "[facility] [place], [place]

## 2021-02-04 NOTE — Telephone Encounter (Signed)
Dr. Havery Moros, patient had cardiology appt today. Please advise if OK to reschedule colonoscopy. Thanks

## 2021-02-05 NOTE — Telephone Encounter (Signed)
Left detailed message on patient's vm letting her know that we have received cardiac clearance and OK from Dr. Havery Moros to get her rescheduled for her colonoscopy. Advised patient to call back to reschedule.

## 2021-02-06 NOTE — Telephone Encounter (Signed)
Patient returned call and rescheduled her colonoscopy for 02/18/21 at 8:30 am with Dr. Havery Moros in the [facility].

## 2021-02-18 ENCOUNTER — Other Ambulatory Visit: Payer: Self-pay

## 2021-02-18 ENCOUNTER — Ambulatory Visit (INDEPENDENT_AMBULATORY_CARE_PROVIDER_SITE_OTHER): Payer: Medicaid Other

## 2021-02-18 ENCOUNTER — Ambulatory Visit (AMBULATORY_SURGERY_CENTER): Payer: Medicaid Other | Admitting: Gastroenterology

## 2021-02-18 ENCOUNTER — Encounter: Payer: Self-pay | Admitting: Gastroenterology

## 2021-02-18 VITALS — BP 116/77 | HR 86 | Temp 98.4°F | Resp 13 | Ht 66.0 in | Wt 178.0 lb

## 2021-02-18 DIAGNOSIS — Z86010 Personal history of colonic polyps: Secondary | ICD-10-CM

## 2021-02-18 DIAGNOSIS — I4581 Long QT syndrome: Secondary | ICD-10-CM | POA: Diagnosis not present

## 2021-02-18 DIAGNOSIS — D122 Benign neoplasm of ascending colon: Secondary | ICD-10-CM | POA: Diagnosis not present

## 2021-02-18 DIAGNOSIS — Z1211 Encounter for screening for malignant neoplasm of colon: Secondary | ICD-10-CM | POA: Diagnosis not present

## 2021-02-18 DIAGNOSIS — D120 Benign neoplasm of cecum: Secondary | ICD-10-CM | POA: Diagnosis not present

## 2021-02-18 MED ORDER — SODIUM CHLORIDE 0.9 % IV SOLN: 500.0000 mL | Freq: Once | INTRAVENOUS | Status: DC

## 2021-02-18 NOTE — Op Note (Signed)
[place] Patient Name: [name] Procedure Date: 02/18/2021 8:16 AM MRN: [id number] Endoscopist: Remo Lipps P. Havery Moros , MD Age: 50 Referring MD:  Date of Birth: 08-28-1970 Gender: Female Account #: [id number] Procedure:                Colonoscopy Indications:              High risk colon cancer surveillance: Personal                            history of colonic polyps - 3 adenomas including                            one advanced adenoma removed 04/2017 Medicines:                Monitored Anesthesia Care Procedure:                Pre-Anesthesia Assessment:                           - Prior to the procedure, a History and Physical                            was performed, and patient medications and                            allergies were reviewed. The patient's tolerance of                            previous anesthesia was also reviewed. The risks                            and benefits of the procedure and the sedation                            options and risks were discussed with the patient.                            All questions were answered, and informed consent                            was obtained. Prior Anticoagulants: The patient has                            taken no previous anticoagulant or antiplatelet                            agents. ASA Grade Assessment: III - A patient with                            severe systemic disease. After reviewing the risks                            and benefits, the patient was deemed in  satisfactory condition to undergo the procedure.                           After obtaining informed consent, the colonoscope                            was passed under direct vision. Throughout the                            procedure, the patient's blood pressure, pulse, and                            oxygen saturations were monitored continuously. The                            Colonoscope was  introduced through the anus and                            advanced to the the cecum, identified by                            appendiceal orifice and ileocecal valve. The                            colonoscopy was performed without difficulty. The                            patient tolerated the procedure well. The quality                            of the bowel preparation was good. The ileocecal                            valve, appendiceal orifice, and rectum were                            photographed. Scope In: 8:30:55 AM Scope Out: 8:46:31 AM Scope Withdrawal Time: 0 hours 12 minutes 41 seconds  Total Procedure Duration: 0 hours 15 minutes 36 seconds  Findings:                 The perianal and digital rectal examinations were                            normal.                           A diminutive polyp was found in the cecum. The                            polyp was sessile. The polyp was removed with a                            cold snare. Resection and retrieval were complete.  A 4 mm polyp was found in the ascending colon. The                            polyp was sessile. The polyp was removed with a                            cold snare. Resection and retrieval were complete.                           Two sessile polyps were found in the ascending                            colon. The polyps were diminutive in size. These                            polyps were removed with a cold snare. Resection                            and retrieval were complete.                           Internal hemorrhoids were found during retroflexion.                           The exam was otherwise without abnormality. Complications:            No immediate complications. Estimated blood loss:                            Minimal. Estimated Blood Loss:     Estimated blood loss was minimal. Impression:               - One diminutive polyp in the cecum, removed with a                             cold snare. Resected and retrieved.                           - One 4 mm polyp in the ascending colon, removed                            with a cold snare. Resected and retrieved.                           - Two diminutive polyps in the ascending colon,                            removed with a cold snare. Resected and retrieved.                           - Internal hemorrhoids.                           - The examination was otherwise normal. Recommendation:           -  Patient has a contact number available for                            emergencies. The signs and symptoms of potential                            delayed complications were discussed with the                            patient. Return to normal activities tomorrow.                            Written discharge instructions were provided to the                            patient.                           - Resume previous diet.                           - Continue present medications.                           - Await pathology results. Remo Lipps P. Gillie Crisci, MD 02/18/2021 8:50:12 AM This report has been signed electronically.

## 2021-02-18 NOTE — Progress Notes (Signed)
CW - VS  Pt was rescheduled for colonoscopy, she misplaced her sutab instuructions.  She called her pharmacy and she went by the instructions in the sutab box.  She took all 12 tabs 5:00 pm last night, and this am 1:45.  Reports clear yellowish, green liquid.  Pt did receive cardiac clearance for colonoscopy.

## 2021-02-18 NOTE — Progress Notes (Signed)
[facility] History and Physical   Primary Care Physician:  Zola Button, MD   Reason for Procedure:   History of colon polyps  Plan:    colonoscopy     HPI: [name] is a 50 y.o. female  here for colonoscopy surveillance - advanced adenoma removed in 04/2017. Patient has chronic constipation in [place]. No first degree family history of colon cancer known. Otherwise feels well without any cardiopulmonary symptoms. Cleared by cardiology for history of prolonged QT. She denies other complaints today and feels well.   Past Medical History:  Diagnosis Date   Abdominal pain, periumbilic    Allergic rhinitis    Allergy    Anemia    Anxiety    Arthritis    Asthma    Automatic implantable cardioverter-defibrillator in situ 2006, replaced 2008   MEDTRONIC   Bronchitis    Cancer ([place])    Nodules in lungs pt believes were cancerous pt unsure   COPD (chronic obstructive pulmonary disease) ([place])    DM (diabetes mellitus) ([place]) 1997/1998   type 1, initially gestational but soon after child birth developed IDDM   Esophageal stricture 2005/2009   esophageal strictures dilated 2005, 2009   Eye hemorrhage    bilateral   Fibroid, uterine    Fibromyalgia    GERD (gastroesophageal reflux disease) 2005   History of blood transfusion years ago   Hypertension    Internal hemorrhoids 2010   2011 band ligation of int rrhoids.   Long Q-T syndrome    Lupus ([place])    questionable   Neuropathy in diabetes [facility])    Palpitation    Sarcoidosis    Seizure disorder ([place])    mid June 2022 last seizure pt reported   Stroke [facility])    QUESTIONABLE TIA .  Jun 29, 2019    Past Surgical History:  Procedure Laterality Date   AUTOMATIC IMPLANTABLE CARDIAC DEFIBRILLATOR SITU  2006,    ICD-Medtronic   Remote - Yes    CERVIX REMOVAL     DILITATION & CURRETTAGE/HYSTROSCOPY WITH HYDROTHERMAL ABLATION N/A 06/07/2013   Procedure: DILATATION & CURETTAGE/HYSTEROSCOPY WITH attempted  HYDROTHERMAL ABLATION;  Surgeon: Frederico Hamman, MD;  Location: [facility];  Service: Gynecology;  Laterality: N/A;   EP IMPLANTABLE DEVICE N/A 04/04/2015   Procedure:  ICD Generator Changeout;  Surgeon: Evans Lance, MD;  Location: [facility];  Service: Cardiovascular;  Laterality: N/A;   ESOPHAGOGASTRODUODENOSCOPY (EGD) WITH PROPOFOL N/A 10/24/2014   Procedure: ESOPHAGOGASTRODUODENOSCOPY (EGD) WITH PROPOFOL;  Surgeon: Inda Castle, MD;  Location: [facility];  Service: Endoscopy;  Laterality: N/A;   EXPLORATORY LAPAROTOMY  11/06/14   evacuation of post TAH/BSO hematoma   HAND SURGERY Right    x 2   HEMORRHOID BANDING  03/2010   SALPINGECTOMY     SVT ablation     TOTAL ABDOMINAL HYSTERECTOMY W/ BILATERAL SALPINGOOPHORECTOMY  11/06/14   at [facility]. for menorrhagia, pelvic pain and enlarging uterine fibroids   TUBAL LIGATION     VIDEO BRONCHOSCOPY Bilateral 01/24/2015   Procedure: VIDEO BRONCHOSCOPY WITHOUT FLUORO;  Surgeon: Marshell Garfinkel, MD;  Location: [facility];  Service: Cardiopulmonary;  Laterality: Bilateral;    Prior to Admission medications   Medication Sig Start Date End Date Taking? Authorizing Provider  Accu-Chek Softclix Lancets lancets Use as instructed 03/14/20  Yes Leavy Cella, RPH-CPP  acetaminophen (TYLENOL) 500 MG tablet Take 1,000 mg by mouth every 6 (six) hours as needed for mild pain or fever. Reported  on 07/22/2015   Yes [provider]  glucose blood (ACCU-CHEK GUIDE) test strip Check sugars four times daily 03/14/20  Yes Leavy Cella, RPH-CPP  Insulin Pen Needle 31G X 8 MM MISC BD UltraFine III Pen Needles. For use with insulin pen device. Inject insulin 6 x daily 03/14/20  Yes Leavy Cella, RPH-CPP  Lancet Devices (ACCU-CHEK [place]) lancets Use as instructed 10/20/16  Yes Nicolette Bang, MD  NOVOLOG FLEXPEN 100 UNIT/ML FlexPen 6-10 units three times per day with meals Use 10 units for Sugars > 160 Use 8 units for sugars 150-160 Use 6  units for sugars <150 03/14/20  Yes Leavy Cella, RPH-CPP  TRESIBA FLEXTOUCH 200 UNIT/ML FlexTouch Pen ADMINISTER 60 UNITS UNDER THE SKIN DAILY AT 6 AM 05/31/20  Yes Benay Pike, MD  aspirin EC 81 MG tablet Take 1 tablet (81 mg total) by mouth daily. 07/17/19   Cameron Sprang, MD  beclomethasone (QVAR) 80 MCG/ACT inhaler Inhale 2 puffs into the lungs 2 (two) times daily. 09/18/19   Madalyn Rob, MD  Camphor-Menthol-Methyl Sal (SALONPAS) 3.05-23-08 % [facility] Apply 1 patch topically daily as needed (pain).    [provider]  cycloSPORINE (RESTASIS) 0.05 % ophthalmic emulsion Place 1 drop into both eyes daily.    [provider]  fluticasone (FLONASE) 50 MCG/ACT nasal spray Place 2 sprays into both nostrils daily as needed for allergies.     [provider]  gabapentin (NEURONTIN) 300 MG capsule Take 1 capsule (300 mg total) by mouth 3 (three) times daily. 03/14/20 02/04/21  Leavy Cella, RPH-CPP  lamoTRIgine (LAMICTAL) 150 MG tablet Take 1 tablet (150 mg total) by mouth 2 (two) times daily. 11/22/19   Cameron Sprang, MD  levETIRAcetam (KEPPRA) 500 MG tablet Take 1 tablet (500 mg total) by mouth 2 (two) times daily. 11/22/19   Cameron Sprang, MD  linaclotide Rolan Lipa) 290 MCG CAPS capsule Take 1 capsule (290 mcg total) by mouth daily before breakfast. Please keep your August appointment for further refills. Thank you 12/12/20   Yetta Flock, MD  metFORMIN (GLUCOPHAGE-XR) 500 MG 24 hr tablet Take 1 tablet (500 mg total) by mouth daily with breakfast. 10/30/19 01/09/21  Sherene Sires, DO  naproxen (NAPROSYN) 375 MG tablet Take 1 tablet (375 mg total) by mouth 2 (two) times daily with a meal. Patient taking differently: Take 375 mg by mouth as needed. 09/01/19   Sherene Sires, DO  Olopatadine HCl 0.7 % SOLN Place 1 drop into both eyes daily as needed (eye allergies).     [provider]  pantoprazole (PROTONIX) 20 MG tablet Take 1 tablet (20 mg total) by mouth daily as  needed. 09/13/19   Eleasha Cataldo, Carlota Raspberry, MD  potassium chloride SA (K-DUR,KLOR-CON) 20 MEQ tablet Take 1 tablet (20 mEq total) by mouth daily. 03/08/18   Sherene Sires, DO  Prenat-Fe Poly-Methfol-FA-DHA (VITAFOL ULTRA) 29-0.6-0.4-200 MG CAPS Take 1 capsule by mouth daily before breakfast. 08/08/20   Shelly Bombard, MD  propranolol (INDERAL) 80 MG tablet Take 1 tablet (80 mg total) by mouth 2 (two) times daily. 01/23/20   Evans Lance, MD    Current Outpatient Medications  Medication Sig Dispense Refill   Accu-Chek Softclix Lancets lancets Use as instructed 100 each 12   acetaminophen (TYLENOL) 500 MG tablet Take 1,000 mg by mouth every 6 (six) hours as needed for mild pain or fever. Reported on 07/22/2015     glucose blood (ACCU-CHEK GUIDE) test  strip Check sugars four times daily 100 each 12   Insulin Pen Needle 31G X 8 MM MISC BD UltraFine III Pen Needles. For use with insulin pen device. Inject insulin 6 x daily 200 each 3   Lancet Devices (ACCU-CHEK SOFTCLIX) lancets Use as instructed 1 each 0   NOVOLOG FLEXPEN 100 UNIT/ML FlexPen 6-10 units three times per day with meals Use 10 units for Sugars > 160 Use 8 units for sugars 150-160 Use 6 units for sugars <150 15 mL 3   TRESIBA FLEXTOUCH 200 UNIT/ML FlexTouch Pen ADMINISTER 60 UNITS UNDER THE SKIN DAILY AT 6 AM 9 mL 5   aspirin EC 81 MG tablet Take 1 tablet (81 mg total) by mouth daily. 30 tablet 11   beclomethasone (QVAR) 80 MCG/ACT inhaler Inhale 2 puffs into the lungs 2 (two) times daily. 1 Inhaler 5   Camphor-Menthol-Methyl Sal (SALONPAS) 3.05-23-08 % [facility] Apply 1 patch topically daily as needed (pain).     cycloSPORINE (RESTASIS) 0.05 % ophthalmic emulsion Place 1 drop into both eyes daily.     fluticasone (FLONASE) 50 MCG/ACT nasal spray Place 2 sprays into both nostrils daily as needed for allergies.      gabapentin (NEURONTIN) 300 MG capsule Take 1 capsule (300 mg total) by mouth 3 (three) times daily. 90 capsule 2   lamoTRIgine  (LAMICTAL) 150 MG tablet Take 1 tablet (150 mg total) by mouth 2 (two) times daily. 180 tablet 3   levETIRAcetam (KEPPRA) 500 MG tablet Take 1 tablet (500 mg total) by mouth 2 (two) times daily. 180 tablet 3   linaclotide (LINZESS) 290 MCG CAPS capsule Take 1 capsule (290 mcg total) by mouth daily before breakfast. Please keep your August appointment for further refills. Thank you 30 capsule 0   metFORMIN (GLUCOPHAGE-XR) 500 MG 24 hr tablet Take 1 tablet (500 mg total) by mouth daily with breakfast. 30 tablet 2   naproxen (NAPROSYN) 375 MG tablet Take 1 tablet (375 mg total) by mouth 2 (two) times daily with a meal. (Patient taking differently: Take 375 mg by mouth as needed.) 60 tablet 3   Olopatadine HCl 0.7 % SOLN Place 1 drop into both eyes daily as needed (eye allergies).      pantoprazole (PROTONIX) 20 MG tablet Take 1 tablet (20 mg total) by mouth daily as needed. 90 tablet 3   potassium chloride SA (K-DUR,KLOR-CON) 20 MEQ tablet Take 1 tablet (20 mEq total) by mouth daily. 30 tablet 2   Prenat-Fe Poly-Methfol-FA-DHA (VITAFOL ULTRA) 29-0.6-0.4-200 MG CAPS Take 1 capsule by mouth daily before breakfast. 30 capsule 11   propranolol (INDERAL) 80 MG tablet Take 1 tablet (80 mg total) by mouth 2 (two) times daily. 180 tablet 3   Current Facility-Administered Medications  Medication Dose Route Frequency Provider Last Rate Last Admin   0.9 %  sodium chloride infusion  500 mL Intravenous Once Spiro Ausborn, Carlota Raspberry, MD        Allergies as of 02/18/2021 - Review Complete 02/18/2021  Allergen Reaction Noted   Contrast media [iodinated diagnostic agents] Anaphylaxis and Shortness Of Breath 05/30/2013   Iohexol Anaphylaxis 05/28/2006   Augmentin [amoxicillin-pot clavulanate] Other (See Comments) 10/23/2014   Codeine Nausea And Vomiting    Demerol [meperidine] Other (See Comments) 07/22/2015   Morphine and related Nausea And Vomiting 05/30/2013   Lantus [insulin glargine]  06/30/2019    Family  History  Problem Relation Age of Onset   Heart attack Mother    Lung cancer Maternal Uncle  x2   Breast cancer Maternal Aunt        x2   Stomach cancer Paternal Uncle    Stomach cancer Paternal Grandfather    Stomach cancer Paternal Grandmother    Diabetes Father    Positive PPD/TB Exposure Father    Glaucoma Father    Esophageal cancer Paternal Aunt    Colon cancer Maternal Grandfather     Social History   Socioeconomic History   Marital status: Single    Spouse name: Not on file   Number of children: 3   Years of education: Not on file   Highest education level: Not on file  Occupational History   Occupation: disabled  Tobacco Use   Smoking status: Never   Smokeless tobacco: Never  Vaping Use   Vaping Use: Never used  Substance and Sexual Activity   Alcohol use: Not Currently    Alcohol/week: 0.0 standard drinks    Comment: None since 2015-was occasional   Drug use: Never   Sexual activity: Yes    Birth control/protection: Condom  Other Topics Concern   Not on file  Social History Narrative   Right handed    Social Determinants of Health   Financial Resource Strain: Not on file  Food Insecurity: Not on file  Transportation Needs: Not on file  Physical Activity: Not on file  Stress: Not on file  Social Connections: Not on file  Intimate Partner Violence: Not on file    Review of Systems: All other review of systems negative except as mentioned in the HPI.  Physical Exam: Vital signs BP (!) 153/101   Pulse 76   Temp 98.4 F (36.9 C)   Resp 10   Ht 5\' 6"  (1.676 m)   Wt 178 lb (80.7 kg)   LMP 07/23/2014   SpO2 99%   BMI 28.73 kg/m   General:   Alert,  Well-developed, pleasant and cooperative in NAD Lungs:  Clear throughout to auscultation.   Heart:  Regular rate and rhythm Abdomen:  Soft, nontender and nondistended.   Neuro/Psych:  Alert and cooperative. Normal mood and affect. A and O x 3  Jolly Mango, MD [facility]  Gastroenterology

## 2021-02-18 NOTE — Progress Notes (Signed)
Called to room to assist during endoscopic procedure.  Patient ID and intended procedure confirmed with present staff. Received instructions for my participation in the procedure from the performing physician.  

## 2021-02-18 NOTE — Patient Instructions (Signed)
Thank you for letting us take care of your healthcare needs today. Please see handouts given to you on Polyps and Hemorrhoids.     YOU HAD AN ENDOSCOPIC PROCEDURE TODAY AT THE [facility]:   Refer to the procedure report that was given to you for any specific questions about what was found during the examination.  If the procedure report does not answer your questions, please call your gastroenterologist to clarify.  If you requested that your care partner not be given the details of your procedure findings, then the procedure report has been included in a sealed envelope for you to review at your convenience later.  YOU SHOULD EXPECT: Some feelings of bloating in the abdomen. Passage of more gas than usual.  Walking can help get rid of the air that was put into your GI tract during the procedure and reduce the bloating. If you had a lower endoscopy (such as a colonoscopy or flexible sigmoidoscopy) you may notice spotting of blood in your stool or on the toilet paper. If you underwent a bowel prep for your procedure, you may not have a normal bowel movement for a few days.  Please Note:  You might notice some irritation and congestion in your nose or some drainage.  This is from the oxygen used during your procedure.  There is no need for concern and it should clear up in a day or so.  SYMPTOMS TO REPORT IMMEDIATELY:  Following lower endoscopy (colonoscopy or flexible sigmoidoscopy):  Excessive amounts of blood in the stool  Significant tenderness or worsening of abdominal pains  Swelling of the abdomen that is new, acute  Fever of 100F or higher  For urgent or emergent issues, a gastroenterologist can be reached at any hour by calling [phone]. Do not use MyChart messaging for urgent concerns.    DIET:  We do recommend a small meal at first, but then you may proceed to your regular diet.  Drink plenty of fluids but you should avoid alcoholic beverages for 24  hours.  ACTIVITY:  You should plan to take it easy for the rest of today and you should NOT DRIVE or use heavy machinery until tomorrow (because of the sedation medicines used during the test).    FOLLOW UP: Our staff will call the number listed on your records 48-72 hours following your procedure to check on you and address any questions or concerns that you may have regarding the information given to you following your procedure. If we do not reach you, we will leave a message.  We will attempt to reach you two times.  During this call, we will ask if you have developed any symptoms of COVID 19. If you develop any symptoms (ie: fever, flu-like symptoms, shortness of breath, cough etc.) before then, please call [phone].  If you test positive for Covid 19 in the 2 weeks post procedure, please call and report this information to us.    If any biopsies were taken you will be contacted by phone or by letter within the next 1-3 weeks.  Please call us at [phone] if you have not heard about the biopsies in 3 weeks.    SIGNATURES/CONFIDENTIALITY: You and/or your care partner have signed paperwork which will be entered into your electronic medical record.  These signatures attest to the fact that that the information above on your After Visit Summary has been reviewed and is understood.  Full responsibility of the confidentiality of this discharge information   lies with you and/or your care-partner.  

## 2021-02-18 NOTE — Progress Notes (Signed)
Pt Drowsy. VSS. To PACU, report to RN. No anesthetic complications noted.  

## 2021-02-19 LAB — CUP PACEART REMOTE DEVICE CHECK
Battery Remaining Longevity: 73 mo
Battery Voltage: 2.99 V
Brady Statistic RV Percent Paced: 0.01 %
Date Time Interrogation Session: 20221005013825
HighPow Impedance: 50 Ohm
HighPow Impedance: 76 Ohm
Implantable Lead Implant Date: 20080828
Implantable Lead Location: 753860
Implantable Lead Model: 6947
Implantable Pulse Generator Implant Date: 20161117
Lead Channel Impedance Value: 323 Ohm
Lead Channel Impedance Value: 399 Ohm
Lead Channel Pacing Threshold Amplitude: 1.25 V
Lead Channel Pacing Threshold Pulse Width: 0.4 ms
Lead Channel Sensing Intrinsic Amplitude: 3 mV
Lead Channel Sensing Intrinsic Amplitude: 3 mV
Lead Channel Setting Pacing Amplitude: 2.5 V
Lead Channel Setting Pacing Pulse Width: 0.4 ms
Lead Channel Setting Sensing Sensitivity: 0.3 mV

## 2021-02-20 ENCOUNTER — Telehealth: Payer: Self-pay

## 2021-02-20 NOTE — Telephone Encounter (Signed)
  Follow up Call-  Call back number 02/18/2021  Post procedure Call Back phone  # [phone] cell  Permission to leave phone message Yes  Some recent data might be hidden     Patient questions:  Do you have a fever, pain , or abdominal swelling? No. Pain Score  0 *  Have you tolerated food without any problems? Yes.    Have you been able to return to your normal activities? Yes.    Do you have any questions about your discharge instructions: Diet   No. Medications  No. Follow up visit  No.  Do you have questions or concerns about your Care? No.  Actions: * If pain score is 4 or above: No action needed, pain <4.

## 2021-02-26 ENCOUNTER — Other Ambulatory Visit (HOSPITAL_COMMUNITY)
Admission: RE | Admit: 2021-02-26 | Discharge: 2021-02-26 | Disposition: A | Discharge status: Home / Self Care | Payer: Medicaid Other | Source: Ambulatory Visit | Attending: Obstetrics & Gynecology | Admitting: Obstetrics & Gynecology

## 2021-02-26 ENCOUNTER — Ambulatory Visit: Payer: Medicaid Other | Admitting: *Deleted

## 2021-02-26 ENCOUNTER — Other Ambulatory Visit: Payer: Self-pay

## 2021-02-26 VITALS — BP 143/87 | HR 63

## 2021-02-26 DIAGNOSIS — N898 Other specified noninflammatory disorders of vagina: Secondary | ICD-10-CM | POA: Insufficient documentation

## 2021-02-26 NOTE — Progress Notes (Signed)
Remote ICD transmission.   

## 2021-02-26 NOTE — Progress Notes (Signed)
SUBJECTIVE:  50 y.o. female complains of clear and scant vaginal discharge for na na. Denies abnormal vaginal bleeding or significant pelvic pain or fever. No UTI symptoms. Denies history of known exposure to STD.  Patient's last menstrual period was 07/23/2014.  OBJECTIVE:  She appears well, afebrile. Urine dipstick: not done.  ASSESSMENT:  Vaginal Discharge  Vaginal Odor   PLAN:  GC, chlamydia, trichomonas, BVAG, CVAG probe sent to lab. Treatment: To be determined once lab results are received ROV prn if symptoms persist or worsen.

## 2021-02-28 ENCOUNTER — Telehealth: Payer: Self-pay | Admitting: *Deleted

## 2021-02-28 ENCOUNTER — Other Ambulatory Visit: Payer: Self-pay | Admitting: Family Medicine

## 2021-02-28 LAB — CERVICOVAGINAL ANCILLARY ONLY
Bacterial Vaginitis (gardnerella): POSITIVE — AB
Candida Glabrata: NEGATIVE
Candida Vaginitis: NEGATIVE
Chlamydia: NEGATIVE
Comment: NEGATIVE
Comment: NEGATIVE
Comment: NEGATIVE
Comment: NEGATIVE
Comment: NEGATIVE
Comment: NORMAL
Neisseria Gonorrhea: NEGATIVE
Trichomonas: NEGATIVE

## 2021-02-28 MED ORDER — METRONIDAZOLE 500 MG PO TABS
500.0000 mg | ORAL_TABLET | Freq: Two times a day (BID) | ORAL | 0 refills | Status: DC
Start: 2021-02-28 — End: 2021-03-30

## 2021-02-28 NOTE — Telephone Encounter (Signed)
Pt called to office wanting to discuss recent results.   Attempt to contact pt x 2, no answer.

## 2021-03-05 ENCOUNTER — Telehealth: Payer: Self-pay | Admitting: Gastroenterology

## 2021-03-05 NOTE — Telephone Encounter (Signed)
Lm on vm for patient to return call 

## 2021-03-05 NOTE — Telephone Encounter (Signed)
Inbound call from patient, had procedure 10/4. States she has had excessive gas that has really gotten bad within the last couple days along with feeling nausea.

## 2021-03-06 NOTE — Telephone Encounter (Signed)
Lm on vm for patient to return call 

## 2021-03-07 ENCOUNTER — Other Ambulatory Visit: Payer: Self-pay

## 2021-03-07 MED ORDER — METRONIDAZOLE 500 MG PO TABS: 500.0000 mg | ORAL_TABLET | Freq: Two times a day (BID) | ORAL | 0 refills | Status: DC

## 2021-03-07 NOTE — Telephone Encounter (Signed)
LVM requesting returned call to inquire further about her concerns.

## 2021-03-07 NOTE — Telephone Encounter (Signed)
Patient is requesting another refill, she never pick the first one up so they put in back in stock.

## 2021-03-10 NOTE — Telephone Encounter (Signed)
No return call received, will await further contact from patient.

## 2021-03-30 ENCOUNTER — Other Ambulatory Visit: Payer: Self-pay | Admitting: Family Medicine

## 2021-03-30 MED ORDER — METRONIDAZOLE 500 MG PO TABS: 500.0000 mg | ORAL_TABLET | Freq: Two times a day (BID) | ORAL | 0 refills | Status: DC

## 2021-04-02 ENCOUNTER — Other Ambulatory Visit (HOSPITAL_COMMUNITY)
Admission: RE | Admit: 2021-04-02 | Discharge: 2021-04-02 | Disposition: A | Discharge status: Home / Self Care | Payer: Medicaid Other | Source: Ambulatory Visit | Attending: Obstetrics and Gynecology | Admitting: Obstetrics and Gynecology

## 2021-04-02 ENCOUNTER — Other Ambulatory Visit: Payer: Self-pay

## 2021-04-02 ENCOUNTER — Ambulatory Visit (INDEPENDENT_AMBULATORY_CARE_PROVIDER_SITE_OTHER): Payer: Medicaid Other | Admitting: *Deleted

## 2021-04-02 DIAGNOSIS — N898 Other specified noninflammatory disorders of vagina: Secondary | ICD-10-CM

## 2021-04-02 MED ORDER — FLUCONAZOLE 150 MG PO TABS: 150.0000 mg | ORAL_TABLET | Freq: Once | ORAL | 0 refills | Status: AC

## 2021-04-02 NOTE — Progress Notes (Signed)
SUBJECTIVE:  50 y.o. female complains of vaginal itching after recent antibiotic use. Denies abnormal vaginal bleeding or significant pelvic pain or fever. No UTI symptoms. Denies history of known exposure to STD.  Patient's last menstrual period was 07/23/2014.  OBJECTIVE:  She appears well, afebrile. Urine dipstick: not done.  ASSESSMENT:  Vaginal Discharge  Vaginal Odor   PLAN:  Diflucan to be sent today.  BVAG, CVAG probe sent to lab.  ROV prn if symptoms persist or worsen.

## 2021-04-02 NOTE — Progress Notes (Signed)
Agree with nurses's documentation of this patient's clinic encounter.  Jalicia Roszak L, MD  

## 2021-04-03 LAB — CERVICOVAGINAL ANCILLARY ONLY
Bacterial Vaginitis (gardnerella): NEGATIVE
Candida Glabrata: NEGATIVE
Candida Vaginitis: POSITIVE — AB
Comment: NEGATIVE
Comment: NEGATIVE
Comment: NEGATIVE

## 2021-04-04 ENCOUNTER — Other Ambulatory Visit: Payer: Self-pay

## 2021-04-04 DIAGNOSIS — N898 Other specified noninflammatory disorders of vagina: Secondary | ICD-10-CM

## 2021-04-04 MED ORDER — FLUCONAZOLE 150 MG PO TABS: 150.0000 mg | ORAL_TABLET | Freq: Once | ORAL | 0 refills | Status: AC

## 2021-05-02 ENCOUNTER — Other Ambulatory Visit: Payer: Self-pay

## 2021-05-02 DIAGNOSIS — B3731 Acute candidiasis of vulva and vagina: Secondary | ICD-10-CM

## 2021-05-02 MED ORDER — FLUCONAZOLE 150 MG PO TABS: 150.0000 mg | ORAL_TABLET | Freq: Once | ORAL | 0 refills | Status: AC

## 2021-05-02 NOTE — Progress Notes (Signed)
Pt c/o vaginal discharge with itching. Request Rx be sent to pharmacy. Rx sent.

## 2021-05-14 ENCOUNTER — Telehealth: Payer: Self-pay | Admitting: Pulmonary Disease

## 2021-05-14 NOTE — Telephone Encounter (Signed)
I called the pt and there was no answer- LMTCB  Needs to be offered appt, not seen in over a year  Forwarding back to triage marked urgent as pt is c/o acute symptom.

## 2021-05-15 NOTE — Telephone Encounter (Signed)
Called and spoke with patient, she states that her dry cough has returned that is worse around the time that she eats.  She ran out of her pantoprazole about a month ago, her pcp was supposed to refill it, but has not refilled it.  Advised that it has been over a year since she has been seen so she will have to have an OV.  Scheduled her to see Katie on 05/21/2020 at 9:30 am, advised to arrive by 9:15 am for check in.  She verbalized understanding.  Advised she can see if her pcp will refill her pantoprazole until she can be seen in the office or try something OTC that has worked in the past until she can be seen next week.  Nothing further needed.

## 2021-05-20 ENCOUNTER — Ambulatory Visit (INDEPENDENT_AMBULATORY_CARE_PROVIDER_SITE_OTHER): Payer: Medicaid Other

## 2021-05-20 DIAGNOSIS — I4581 Long QT syndrome: Secondary | ICD-10-CM | POA: Diagnosis not present

## 2021-05-20 LAB — CUP PACEART REMOTE DEVICE CHECK
Battery Remaining Longevity: 68 mo
Battery Voltage: 3 V
Brady Statistic RV Percent Paced: 0.01 %
Date Time Interrogation Session: 20230103044222
HighPow Impedance: 50 Ohm
HighPow Impedance: 73 Ohm
Implantable Lead Implant Date: 20080828
Implantable Lead Location: 753860
Implantable Lead Model: 6947
Implantable Pulse Generator Implant Date: 20161117
Lead Channel Impedance Value: 323 Ohm
Lead Channel Impedance Value: 380 Ohm
Lead Channel Pacing Threshold Amplitude: 1.5 V
Lead Channel Pacing Threshold Pulse Width: 0.4 ms
Lead Channel Sensing Intrinsic Amplitude: 1.75 mV
Lead Channel Sensing Intrinsic Amplitude: 1.75 mV
Lead Channel Setting Pacing Amplitude: 3 V
Lead Channel Setting Pacing Pulse Width: 0.4 ms
Lead Channel Setting Sensing Sensitivity: 0.3 mV

## 2021-05-21 ENCOUNTER — Ambulatory Visit: Payer: Medicaid Other | Admitting: Nurse Practitioner

## 2021-05-30 NOTE — Progress Notes (Signed)
Remote ICD transmission.   

## 2021-06-06 ENCOUNTER — Ambulatory Visit: Payer: Medicaid Other | Admitting: Nurse Practitioner

## 2021-06-06 ENCOUNTER — Ambulatory Visit (INDEPENDENT_AMBULATORY_CARE_PROVIDER_SITE_OTHER): Payer: Medicaid Other

## 2021-06-06 ENCOUNTER — Other Ambulatory Visit: Payer: Self-pay

## 2021-06-06 ENCOUNTER — Encounter: Payer: Self-pay | Admitting: Nurse Practitioner

## 2021-06-06 VITALS — BP 118/78 | HR 78 | Temp 98.5°F | Ht 66.0 in | Wt 178.0 lb

## 2021-06-06 DIAGNOSIS — J449 Chronic obstructive pulmonary disease, unspecified: Secondary | ICD-10-CM

## 2021-06-06 DIAGNOSIS — D860 Sarcoidosis of lung: Secondary | ICD-10-CM

## 2021-06-06 DIAGNOSIS — D869 Sarcoidosis, unspecified: Secondary | ICD-10-CM | POA: Diagnosis not present

## 2021-06-06 DIAGNOSIS — K219 Gastro-esophageal reflux disease without esophagitis: Secondary | ICD-10-CM | POA: Diagnosis not present

## 2021-06-06 DIAGNOSIS — R051 Acute cough: Secondary | ICD-10-CM

## 2021-06-06 DIAGNOSIS — R058 Other specified cough: Secondary | ICD-10-CM

## 2021-06-06 DIAGNOSIS — R0600 Dyspnea, unspecified: Secondary | ICD-10-CM

## 2021-06-06 DIAGNOSIS — J3089 Other allergic rhinitis: Secondary | ICD-10-CM | POA: Diagnosis not present

## 2021-06-06 DIAGNOSIS — J4489 Other specified chronic obstructive pulmonary disease: Secondary | ICD-10-CM

## 2021-06-06 MED ORDER — PREDNISONE 10 MG PO TABS
ORAL_TABLET | ORAL | 0 refills | Status: DC
Start: 2021-06-06 — End: 2021-10-27

## 2021-06-06 MED ORDER — HYDROCODONE BIT-HOMATROP MBR 5-1.5 MG/5ML PO SOLN: 5.0000 mL | Freq: Four times a day (QID) | ORAL | 0 refills | Status: DC | PRN

## 2021-06-06 MED ORDER — PANTOPRAZOLE SODIUM 40 MG PO TBEC: 40.0000 mg | DELAYED_RELEASE_TABLET | Freq: Every day | ORAL | 2 refills | Status: AC

## 2021-06-06 MED ORDER — AZELASTINE HCL 0.1 % NA SOLN: 2.0000 | Freq: Two times a day (BID) | NASAL | 12 refills | Status: AC

## 2021-06-06 MED ORDER — BECLOMETHASONE DIPROP HFA 80 MCG/ACT IN AERB: 2.0000 | INHALATION_SPRAY | Freq: Two times a day (BID) | RESPIRATORY_TRACT | 6 refills | Status: DC

## 2021-06-06 NOTE — Assessment & Plan Note (Addendum)
Lungs clear on exam without wheeze or rhonchi. Prednisone taper. Refilled Qvar inhaler.

## 2021-06-06 NOTE — Assessment & Plan Note (Signed)
Multifactorial r/t postnasal drip from chronic rhinitis and GERD. Will increase protonix to 40 mg daily. Continue flonase and benadryl as pt states second gen antihistamines do not help. Will start astelin nasal spray 2 sprays each nostril Twice daily. Saline nasal sprays.

## 2021-06-06 NOTE — Assessment & Plan Note (Addendum)
Breathing stable up until 2 weeks ago when her cough started with increased SOB upon exertion. Will need HRCT and PFTs to evaluate sarcoid further - never completed after last visit. Possible flare today vs upper airway cough vs asthmatic bronchitis flare. Lungs clear on exam. No other systemic sarcoid symptoms evident. Prednisone taper. Hydromet cough syrup - unable to use any phenergan given QT prolongation. CXR today. HRCT and PFTs ordered.   Patient Instructions  -Continue Qvar inhaler 2 puffs Twice daily. Brush tongue and rinse mouth afterwards -Continue flonase nasal spray 2 sprays each nostril daily -Continue benadryl At bedtime for allergy symptoms. Use with caution with cough syrup as it could cause excessive drowsiness -Protonix 40 mg daily for reflux  -Prednisone taper. 4 tabs for 2 days, then 3 tabs for 2 days, 2 tabs for 2 days, then 1 tab for 2 days, then stop. Take in AM with food -Hydromet cough syrup 5 mL every 6 hours as needed for cough. Can cause drowsiness. Do not operate heavy machinery while taking -Mucinex 600 mg Twice daily for cough/congestion -Saline nasal spray 2-3 times a day   Chest x ray today. We will notify you of any abnormal results.  High resolution CT chest ordered today. They will contact you for scheduling  Pulmonary function testing scheduled today.  Follow up with opthalmology yearly  Follow up after PFTs and HRCT with Dr. Vaughan Browner or Alanson Aly. If symptoms do not improve or worsen, please contact office for sooner follow up or seek emergency care.

## 2021-06-06 NOTE — Assessment & Plan Note (Signed)
Increase PPI therapy to 40 mg daily from 20 mg PRN.

## 2021-06-06 NOTE — Progress Notes (Signed)
@Patient  ID: [name], female    DOB: [birth year], 51 y.o.   MRN: [id number]  Chief Complaint  Patient presents with   Follow-up    She reports having a cough x 2 weeks, eating and drinking and talking make it worse. Its a non productive cough.     Referring provider: Zola Button, MD  HPI: 51 year old, never smoker followed for COPD/asthma with chronic bronchitis, sarcoidosis, and allergic rhinitis. She is a patient of Dr. Matilde Bash and was last seen in office on 09/18/2019. Past medical history significant for HTN, hepatosplenomegaly, prolonged QT syndrome with ICD, migraine, GERD, DM II, juvenile epilepsy, fibroid uterus, chronic fatigue.   TEST/EVENTS: 01/16/2021 CT Abd w/o contrast: Tiny nodules RLL question r/t sarcoid stable. No intra-abd or intrapelvic abnormalities.   09/18/2019: OV with Dr. Vaughan Browner. Stage III sarcoidosis. Previous hospitalization for exudative left-sided predominant effusion with neg cultures. Increased SOB over past 3 months.Working on weight loss. No hx of granulomas on lung bx but liv bx does show granulomas. Ordered PFTs and HRCT - never completed. Referred to optho. Refilled Qvar inhaler.   06/06/2021: Today - acute visit Patient presents today for persistent cough that has been ongoing for the past two weeks. She has associated shortness of breath with coughing spells and a slight increase with exertion. She describes her cough as primarily dry; although, she will occasionally get up a very small amount of clear sputum. She has noticed an occasional wheeze at times but also ran out of her Qvar and attributes it to this. She has chronic allergy symptoms with rhinorrhea and post nasal drip which she uses flonase and benadryl for. She denies orthopnea, PND, chest pain, hemoptysis, or lower extremity swelling. She denies any other systemic symptoms of sarcoid. She was told by GI to take protonix for GERD, which may help her cough, but she has yet to start this.  Overall, she feels okay but is tired from coughing so frequently.   Allergies  Allergen Reactions   Contrast Media [Iodinated Contrast Media] Anaphylaxis and Shortness Of Breath    Needed to be defibrillated and patient was pre-medicated with radiology 13 hour premeds.    Iohexol Anaphylaxis     Code: RASH, Desc: PT STATES SHE IS ALLERGIC TO IV CONTRAST 05/28/06/RM, Onset Date: [id number]    Augmentin [Amoxicillin-Pot Clavulanate] Other (See Comments)    "stomach hurt"   Codeine Nausea And Vomiting    jittery   Demerol [Meperidine] Other (See Comments)    dysphoria   Morphine And Related Nausea And Vomiting   Lantus [Insulin Glargine] Other (See Comments)    Patient reports itching    Immunization History  Administered Date(s) Administered   Hep A / Hep B 07/22/2015, 08/19/2015, 01/23/2016   Pneumococcal Polysaccharide-23 01/17/2016    Past Medical History:  Diagnosis Date   Abdominal pain, periumbilic    Allergic rhinitis    Allergy    Anemia    Anxiety    Arthritis    Asthma    Automatic implantable cardioverter-defibrillator in situ 2006, replaced 2008   MEDTRONIC   Bronchitis    Cancer ([place])    Nodules in lungs pt believes were cancerous pt unsure   COPD (chronic obstructive pulmonary disease) ([place])    DM (diabetes mellitus) ([place]) 1997/1998   type 1, initially gestational but soon after child birth developed IDDM   Esophageal stricture 2005/2009   esophageal strictures dilated 2005, 2009   Eye hemorrhage    bilateral  Fibroid, uterine    Fibromyalgia    GERD (gastroesophageal reflux disease) 2005   History of blood transfusion years ago   Hypertension    Internal hemorrhoids 2010   2011 band ligation of int rrhoids.   Long Q-T syndrome    Lupus ([place])    questionable   Neuropathy in diabetes [facility])    Palpitation    Sarcoidosis    Seizure disorder ([place])    mid June 2022 last seizure pt reported   Stroke [facility])    QUESTIONABLE TIA .  Jun 29, 2019     Tobacco History: Social History   Tobacco Use  Smoking Status Never  Smokeless Tobacco Never   Counseling given: Not Answered   Outpatient Medications Prior to Visit  Medication Sig Dispense Refill   Accu-Chek Softclix Lancets lancets Use as instructed 100 each 12   acetaminophen (TYLENOL) 500 MG tablet Take 1,000 mg by mouth every 6 (six) hours as needed for mild pain or fever. Reported on 07/22/2015     aspirin EC 81 MG tablet Take 1 tablet (81 mg total) by mouth daily. 30 tablet 11   Camphor-Menthol-Methyl Sal (SALONPAS) 3.05-23-08 % PTCH Apply 1 patch topically daily as needed (pain).     cycloSPORINE (RESTASIS) 0.05 % ophthalmic emulsion Place 1 drop into both eyes daily.     fluticasone (FLONASE) 50 MCG/ACT nasal spray Place 2 sprays into both nostrils daily as needed for allergies.      glucose blood (ACCU-CHEK GUIDE) test strip Check sugars four times daily 100 each 12   Insulin Pen Needle 31G X 8 MM MISC BD UltraFine III Pen Needles. For use with insulin pen device. Inject insulin 6 x daily 200 each 3   lamoTRIgine (LAMICTAL) 150 MG tablet Take 1 tablet (150 mg total) by mouth 2 (two) times daily. 180 tablet 3   Lancet Devices (ACCU-CHEK SOFTCLIX) lancets Use as instructed 1 each 0   levETIRAcetam (KEPPRA) 500 MG tablet Take 1 tablet (500 mg total) by mouth 2 (two) times daily. 180 tablet 3   linaclotide (LINZESS) 290 MCG CAPS capsule Take 1 capsule (290 mcg total) by mouth daily before breakfast. Please keep your August appointment for further refills. Thank you 30 capsule 0   naproxen (NAPROSYN) 375 MG tablet Take 1 tablet (375 mg total) by mouth 2 (two) times daily with a meal. (Patient taking differently: Take 375 mg by mouth as needed.) 60 tablet 3   NOVOLOG FLEXPEN 100 UNIT/ML FlexPen 6-10 units three times per day with meals Use 10 units for Sugars > 160 Use 8 units for sugars 150-160 Use 6 units for sugars <150 15 mL 3   Olopatadine HCl 0.7 % SOLN Place 1 drop into both  eyes daily as needed (eye allergies).      potassium chloride SA (K-DUR,KLOR-CON) 20 MEQ tablet Take 1 tablet (20 mEq total) by mouth daily. 30 tablet 2   Prenat-Fe Poly-Methfol-FA-DHA (VITAFOL ULTRA) 29-0.6-0.4-200 MG CAPS Take 1 capsule by mouth daily before breakfast. 30 capsule 11   propranolol (INDERAL) 80 MG tablet Take 1 tablet (80 mg total) by mouth 2 (two) times daily. 180 tablet 3   TRESIBA FLEXTOUCH 200 UNIT/ML FlexTouch Pen ADMINISTER 60 UNITS UNDER THE SKIN DAILY AT 6 AM 9 mL 5   beclomethasone (QVAR) 80 MCG/ACT inhaler Inhale 2 puffs into the lungs 2 (two) times daily. 1 Inhaler 5   pantoprazole (PROTONIX) 20 MG tablet Take 1 tablet (20 mg total) by mouth daily as  needed. 90 tablet 3   gabapentin (NEURONTIN) 300 MG capsule Take 1 capsule (300 mg total) by mouth 3 (three) times daily. 90 capsule 2   metFORMIN (GLUCOPHAGE-XR) 500 MG 24 hr tablet Take 1 tablet (500 mg total) by mouth daily with breakfast. 30 tablet 2   metroNIDAZOLE (FLAGYL) 500 MG tablet Take 1 tablet (500 mg total) by mouth 2 (two) times daily. (Patient not taking: Reported on 06/06/2021) 14 tablet 0   No facility-administered medications prior to visit.     Review of Systems:   Constitutional: No weight loss or gain, night sweats, fevers, chills. +fatigue from paroxysmal cough at night HEENT: No headaches, difficulty swallowing, tooth/dental problems, or sore throat. No sneezing, itching, ear ache. +chronic rhinitis; benadryl works better than other second gen antihistamines  CV:  No chest pain, orthopnea, PND, swelling in lower extremities, anasarca, dizziness, palpitations, syncope Resp: +shortness of breath with exertion and coughing spells; paroxysmal dry, hacking cough; occasional wheeze. No excess mucus or change in color of mucus. No hemoptysis. No chest wall deformity GI:  No heartburn, indigestion, abdominal pain, nausea, vomiting, diarrhea, change in bowel habits, loss of appetite, bloody stools.  GU: No  dysuria, change in color of urine, urgency or frequency.  No flank pain, no hematuria  Skin: No rash, lesions, ulcerations MSK:  No joint pain or swelling.  No decreased range of motion.  No back pain. Neuro: No dizziness or lightheadedness.  Psych: No depression or anxiety. Mood stable.     Physical Exam:  BP 118/78 (BP Location: Left Arm, Patient Position: Sitting, Cuff Size: Normal)    Pulse 78    Temp 98.5 F (36.9 C) (Oral)    Ht 5\' 6"  (1.676 m)    Wt 178 lb (80.7 kg)    LMP 07/23/2014    SpO2 98%    BMI 28.73 kg/m   GEN: Pleasant, interactive, well-nourished; in no acute distress. HEENT:  Normocephalic and atraumatic. EACs patent bilaterally. TM pearly gray with present light reflex bilaterally. PERRLA. Sclera white. Nasal turbinates pink, moist and patent bilaterally. Clear rhinorrhea present. Oropharynx erythematous and moist, without exudate or edema. No lesions, ulcerations NECK:  Supple w/ fair ROM. No JVD present. Normal carotid impulses w/o bruits. Thyroid symmetrical with no goiter or nodules palpated. No lymphadenopathy.   CV: RRR, no m/r/g, no peripheral edema. Pulses intact, +2 bilaterally. No cyanosis, pallor or clubbing. PULMONARY:  Unlabored, regular breathing. Clear bilaterally A&P w/o wheezes/rales/rhonchi. No accessory muscle use. No dullness to percussion. Frequent dry, hacking cough on exam GI: BS present and normoactive. Soft, non-tender to palpation. No organomegaly or masses detected. No CVA tenderness. MSK: No erythema, warmth or tenderness. Cap refil <2 sec all extrem. No deformities or joint swelling noted.  Neuro: A/Ox3. No focal deficits noted.   Skin: Warm, no lesions or rashe Psych: Normal affect and behavior. Judgement and thought content appropriate.     Lab Results:  CBC    Component Value Date/Time   WBC 5.5 02/04/2021 1241   WBC 9.1 07/13/2020 0112   RBC 4.34 02/04/2021 1241   RBC 4.78 07/13/2020 0112   HGB 13.2 02/04/2021 1241   HCT 37.6  02/04/2021 1241   PLT 239 02/04/2021 1241   MCV 87 02/04/2021 1241   MCH 30.4 02/04/2021 1241   MCH 29.3 07/13/2020 0112   MCHC 35.1 02/04/2021 1241   MCHC 33.9 07/13/2020 0112   RDW 11.4 (L) 02/04/2021 1241   LYMPHSABS 3.2 06/29/2019 2052   MONOABS 0.6  06/29/2019 2052   EOSABS 0.1 06/29/2019 2052   BASOSABS 0.1 06/29/2019 2052    BMET    Component Value Date/Time   NA 139 02/04/2021 1241   K 4.0 02/04/2021 1241   CL 101 02/04/2021 1241   CO2 27 02/04/2021 1241   GLUCOSE 284 (H) 02/04/2021 1241   GLUCOSE 250 (H) 07/13/2020 0112   BUN 10 02/04/2021 1241   CREATININE 0.63 02/04/2021 1241   CREATININE 0.41 (L) 03/28/2015 0848   CALCIUM 9.4 02/04/2021 1241   GFRNONAA >60 07/13/2020 0112   GFRAA >60 06/30/2019 0404    BNP No results found for: BNP   Imaging:  CUP PACEART REMOTE DEVICE CHECK  Result Date: 05/20/2021 Scheduled remote reviewed. Normal device function.  Next remote 91 days. LR     PFT Results Latest Ref Rng & Units 08/29/2014  FVC-Pre L 2.98  FVC-Predicted Pre % 85  FVC-Post L 3.11  FVC-Predicted Post % 89  Pre FEV1/FVC % % 81  Post FEV1/FCV % % 81  FEV1-Pre L 2.42  FEV1-Predicted Pre % 85  FEV1-Post L 2.54  TLC L 3.63  TLC % Predicted % 64  RV % Predicted % 50    No results found for: NITRICOXIDE      Assessment & Plan:   Sarcoidosis, stage 3 (HCC) Breathing stable up until 2 weeks ago when her cough started with increased SOB upon exertion. Will need HRCT and PFTs to evaluate sarcoid further - never completed after last visit. Possible flare today vs upper airway cough vs asthmatic bronchitis flare. Lungs clear on exam. No other systemic sarcoid symptoms evident. Prednisone taper. Hydromet cough syrup - unable to use any phenergan given QT prolongation. CXR today. HRCT and PFTs ordered.   Patient Instructions  -Continue Qvar inhaler 2 puffs Twice daily. Brush tongue and rinse mouth afterwards -Continue flonase nasal spray 2 sprays each  nostril daily -Continue benadryl At bedtime for allergy symptoms. Use with caution with cough syrup as it could cause excessive drowsiness -Protonix 40 mg daily for reflux  -Prednisone taper. 4 tabs for 2 days, then 3 tabs for 2 days, 2 tabs for 2 days, then 1 tab for 2 days, then stop. Take in AM with food -Hydromet cough syrup 5 mL every 6 hours as needed for cough. Can cause drowsiness. Do not operate heavy machinery while taking -Mucinex 600 mg Twice daily for cough/congestion -Saline nasal spray 2-3 times a day   Chest x ray today. We will notify you of any abnormal results.  High resolution CT chest ordered today. They will contact you for scheduling  Pulmonary function testing scheduled today.  Follow up with opthalmology yearly  Follow up after PFTs and HRCT with Dr. Vaughan Browner or Alanson Aly. If symptoms do not improve or worsen, please contact office for sooner follow up or seek emergency care.    OBSTRUCTIVE CHRONIC BRONCHITIS Lungs clear on exam without wheeze or rhonchi. Prednisone taper. Refilled Qvar inhaler.   Upper airway cough syndrome Multifactorial r/t postnasal drip from chronic rhinitis and GERD. Will increase protonix to 40 mg daily. Continue flonase and benadryl as pt states second gen antihistamines do not help. Will start astelin nasal spray 2 sprays each nostril Twice daily. Saline nasal sprays.   GERD (gastroesophageal reflux disease) Increase PPI therapy to 40 mg daily from 20 mg PRN.   Dyspnea Sarcoid flare vs asthmatic bronchitis. PFTs and HRCT for further eval. Cough appears to be exacerbating SOB. Low probability for PE on [place].  O2 stable. CXR today.   Clayton Bibles, NP 06/06/2021  Pt aware and understands NP's role.

## 2021-06-06 NOTE — Progress Notes (Signed)
Please send to normal results pool. Thanks!

## 2021-06-06 NOTE — Patient Instructions (Addendum)
-  Continue Qvar inhaler 2 puffs Twice daily. Brush tongue and rinse mouth afterwards -Continue flonase nasal spray 2 sprays each nostril daily -Continue benadryl At bedtime for allergy symptoms. Use with caution with cough syrup as it could cause excessive drowsiness -Protonix 40 mg daily for reflux  -Prednisone taper. 4 tabs for 2 days, then 3 tabs for 2 days, 2 tabs for 2 days, then 1 tab for 2 days, then stop. Take in AM with food -Hydromet cough syrup 5 mL every 6 hours as needed for cough. Can cause drowsiness. Do not operate heavy machinery while taking -Mucinex 600 mg Twice daily for cough/congestion -Saline nasal spray 2-3 times a day   Chest x ray today. We will notify you of any abnormal results.  High resolution CT chest ordered today. They will contact you for scheduling  Pulmonary function testing scheduled today.  Follow up with opthalmology yearly  Follow up after PFTs and HRCT with Dr. Vaughan Browner or Alanson Aly. If symptoms do not improve or worsen, please contact office for sooner follow up or seek emergency care.

## 2021-06-06 NOTE — Assessment & Plan Note (Signed)
Sarcoid flare vs asthmatic bronchitis. PFTs and HRCT for further eval. Cough appears to be exacerbating SOB. Low probability for PE on [place]. O2 stable. CXR today.

## 2021-06-16 ENCOUNTER — Ambulatory Visit (HOSPITAL_BASED_OUTPATIENT_CLINIC_OR_DEPARTMENT_OTHER): Admission: RE | Admit: 2021-06-16 | Payer: Medicaid Other | Source: Ambulatory Visit

## 2021-06-17 ENCOUNTER — Telehealth: Payer: Self-pay | Admitting: Nurse Practitioner

## 2021-06-17 ENCOUNTER — Other Ambulatory Visit: Payer: Self-pay

## 2021-06-17 DIAGNOSIS — E119 Type 2 diabetes mellitus without complications: Secondary | ICD-10-CM

## 2021-06-17 DIAGNOSIS — Z794 Long term (current) use of insulin: Secondary | ICD-10-CM

## 2021-06-17 DIAGNOSIS — J449 Chronic obstructive pulmonary disease, unspecified: Secondary | ICD-10-CM

## 2021-06-17 MED ORDER — TRESIBA FLEXTOUCH 200 UNIT/ML ~~LOC~~ SOPN: PEN_INJECTOR | SUBCUTANEOUS | 0 refills | Status: DC

## 2021-06-17 NOTE — Telephone Encounter (Signed)
ATC patient to talk about her inhaler, LMTCB  Patient states Qvar is too expensive at pharmacy. Would like something else called into pharmacy. Pharmacy is [facility]

## 2021-06-18 ENCOUNTER — Other Ambulatory Visit (HOSPITAL_COMMUNITY): Payer: Self-pay

## 2021-06-18 MED ORDER — FLUTICASONE PROPIONATE HFA 110 MCG/ACT IN AERO: 2.0000 | INHALATION_SPRAY | Freq: Two times a day (BID) | RESPIRATORY_TRACT | 6 refills | Status: DC

## 2021-06-18 NOTE — Telephone Encounter (Signed)
Rx for Flovent 2 puffs Twice daily sent to [facility] on file. Brush tongue and rinse mouth after use. Thanks!

## 2021-06-18 NOTE — Telephone Encounter (Signed)
Called and spoke with patient to let her know that Flovent HFA is covered and the copay would only be $4. Patient expressed understanding and verified preferred pharmacy. RX has been sent. Nothing further needed at this time.

## 2021-06-18 NOTE — Telephone Encounter (Signed)
Let's check with the pharmacy team to see which ICS inhaler would be covered with her insurance. I have added them to this encounter. Thanks!

## 2021-06-18 NOTE — Telephone Encounter (Addendum)
Katie please advise if this is an ok option for the patient   [name], CPhT  You 20 minutes ago (10:45 AM)   Flovent HFA would be covered.  The copay is $4.00.

## 2021-06-23 ENCOUNTER — Telehealth: Payer: Self-pay | Admitting: *Deleted

## 2021-06-23 NOTE — Telephone Encounter (Signed)
Pt called to office stating she was having some Itching, ask for call back  Attempt to return call.  No answer, VM full.

## 2021-06-25 ENCOUNTER — Other Ambulatory Visit: Payer: Self-pay

## 2021-06-25 ENCOUNTER — Other Ambulatory Visit: Payer: Self-pay | Admitting: *Deleted

## 2021-06-25 DIAGNOSIS — N898 Other specified noninflammatory disorders of vagina: Secondary | ICD-10-CM

## 2021-06-25 DIAGNOSIS — E119 Type 2 diabetes mellitus without complications: Secondary | ICD-10-CM

## 2021-06-25 MED ORDER — NOVOLOG FLEXPEN 100 UNIT/ML ~~LOC~~ SOPN: PEN_INJECTOR | SUBCUTANEOUS | 0 refills | Status: DC

## 2021-06-25 MED ORDER — FLUCONAZOLE 150 MG PO TABS: 150.0000 mg | ORAL_TABLET | Freq: Once | ORAL | 0 refills | Status: AC

## 2021-06-25 NOTE — Progress Notes (Signed)
TC from patient reporting thick white vaginal discharge. Reports infection is recurrent. RX Diflucan sent per protocol. AEX was w/in last year. Advised patient to make an appt with provider to discuss recurrent infection. Patient is diabetic. Advised that keeping CBG's WNL may also decrease recurrent yeast infections. Advised to follow up with PCP for report of CBGs in the 200s.

## 2021-07-14 ENCOUNTER — Ambulatory Visit: Payer: Medicaid Other | Admitting: Pulmonary Disease

## 2021-07-14 ENCOUNTER — Other Ambulatory Visit: Payer: Self-pay

## 2021-07-29 ENCOUNTER — Other Ambulatory Visit (HOSPITAL_COMMUNITY)
Admission: RE | Admit: 2021-07-29 | Discharge: 2021-07-29 | Disposition: A | Discharge status: Home / Self Care | Payer: Medicaid Other | Source: Ambulatory Visit | Attending: Obstetrics & Gynecology | Admitting: Obstetrics & Gynecology

## 2021-07-29 ENCOUNTER — Other Ambulatory Visit: Payer: Self-pay

## 2021-07-29 ENCOUNTER — Ambulatory Visit (INDEPENDENT_AMBULATORY_CARE_PROVIDER_SITE_OTHER): Payer: Medicaid Other | Admitting: Emergency Medicine

## 2021-07-29 VITALS — BP 144/81 | HR 70 | Ht 66.0 in | Wt 175.7 lb

## 2021-07-29 DIAGNOSIS — N898 Other specified noninflammatory disorders of vagina: Secondary | ICD-10-CM | POA: Insufficient documentation

## 2021-07-29 MED ORDER — FLUCONAZOLE 150 MG PO TABS: 150.0000 mg | ORAL_TABLET | Freq: Once | ORAL | 0 refills | Status: AC

## 2021-07-29 NOTE — Progress Notes (Signed)
SUBJECTIVE:  ?51 y.o. female complains of clear vaginal discharge and itching for 2-3 days. ?Denies abnormal vaginal bleeding or significant pelvic pain or ?fever. No UTI symptoms. Denies history of known exposure to STD but wants to be tested to be sure.  ? ?Patient's last menstrual period was 07/23/2014. ? ?OBJECTIVE:  ?She appears well, afebrile. ?Urine dipstick: not done. ? ?ASSESSMENT:  ?Vaginal Discharge - "normal" ?Vaginal Odor- NONE ? ? ?PLAN:  ?GC, chlamydia, trichomonas, BVAG, CVAG probe sent to lab. ?Treatment: To be determined once lab results are received ?ROV prn if symptoms persist or worsen.  ? ?Diflucan sent to pharmacy per protocol.  ?

## 2021-07-30 LAB — CERVICOVAGINAL ANCILLARY ONLY
Bacterial Vaginitis (gardnerella): POSITIVE — AB
Candida Glabrata: NEGATIVE
Candida Vaginitis: NEGATIVE
Chlamydia: NEGATIVE
Comment: NEGATIVE
Comment: NEGATIVE
Comment: NEGATIVE
Comment: NEGATIVE
Comment: NEGATIVE
Comment: NORMAL
Neisseria Gonorrhea: NEGATIVE
Trichomonas: NEGATIVE

## 2021-08-01 ENCOUNTER — Other Ambulatory Visit: Payer: Self-pay | Admitting: Family Medicine

## 2021-08-01 DIAGNOSIS — E119 Type 2 diabetes mellitus without complications: Secondary | ICD-10-CM

## 2021-08-04 ENCOUNTER — Other Ambulatory Visit: Payer: Self-pay | Admitting: Obstetrics & Gynecology

## 2021-08-04 DIAGNOSIS — N898 Other specified noninflammatory disorders of vagina: Secondary | ICD-10-CM

## 2021-08-05 ENCOUNTER — Telehealth: Payer: Self-pay

## 2021-08-05 MED ORDER — METRONIDAZOLE 500 MG PO TABS: 500.0000 mg | ORAL_TABLET | Freq: Two times a day (BID) | ORAL | 0 refills | Status: DC

## 2021-08-05 MED ORDER — FLUCONAZOLE 150 MG PO TABS: 150.0000 mg | ORAL_TABLET | Freq: Once | ORAL | 0 refills | Status: DC

## 2021-08-05 NOTE — Telephone Encounter (Signed)
Returned call, pt having vaginal irritation, lab results show +BV, sent rx per protocol. ?

## 2021-08-05 NOTE — Telephone Encounter (Signed)
Returned call, no answer, left vm 

## 2021-08-19 ENCOUNTER — Ambulatory Visit (INDEPENDENT_AMBULATORY_CARE_PROVIDER_SITE_OTHER): Payer: Medicaid Other

## 2021-08-19 DIAGNOSIS — I4581 Long QT syndrome: Secondary | ICD-10-CM

## 2021-08-19 LAB — CUP PACEART REMOTE DEVICE CHECK
Battery Remaining Longevity: 62 mo
Battery Voltage: 2.99 V
Brady Statistic RV Percent Paced: 0.01 %
Date Time Interrogation Session: 20230404072305
HighPow Impedance: 49 Ohm
HighPow Impedance: 72 Ohm
Implantable Lead Implant Date: 20080828
Implantable Lead Location: 753860
Implantable Lead Model: 6947
Implantable Pulse Generator Implant Date: 20161117
Lead Channel Impedance Value: 323 Ohm
Lead Channel Impedance Value: 399 Ohm
Lead Channel Pacing Threshold Amplitude: 1.375 V
Lead Channel Pacing Threshold Pulse Width: 0.4 ms
Lead Channel Sensing Intrinsic Amplitude: 3.25 mV
Lead Channel Sensing Intrinsic Amplitude: 3.25 mV
Lead Channel Setting Pacing Amplitude: 2.75 V
Lead Channel Setting Pacing Pulse Width: 0.4 ms
Lead Channel Setting Sensing Sensitivity: 0.3 mV

## 2021-08-20 ENCOUNTER — Telehealth: Payer: Self-pay | Admitting: Emergency Medicine

## 2021-08-20 NOTE — Telephone Encounter (Signed)
Attempted to [facility] to patient requesting Rx. LM to return call to clinic.  ?

## 2021-08-23 ENCOUNTER — Other Ambulatory Visit: Payer: Self-pay | Admitting: Obstetrics

## 2021-09-04 NOTE — Progress Notes (Signed)
Remote ICD transmission.   

## 2021-09-09 ENCOUNTER — Ambulatory Visit: Payer: Medicaid Other | Admitting: Obstetrics

## 2021-09-10 ENCOUNTER — Telehealth: Payer: Self-pay

## 2021-09-10 NOTE — Telephone Encounter (Signed)
Returned call, no answer, left vm 

## 2021-09-30 ENCOUNTER — Other Ambulatory Visit: Payer: Self-pay | Admitting: Family Medicine

## 2021-09-30 DIAGNOSIS — E119 Type 2 diabetes mellitus without complications: Secondary | ICD-10-CM

## 2021-10-01 ENCOUNTER — Other Ambulatory Visit: Payer: Self-pay

## 2021-10-01 DIAGNOSIS — E119 Type 2 diabetes mellitus without complications: Secondary | ICD-10-CM

## 2021-10-01 MED ORDER — ACCU-CHEK GUIDE VI STRP: ORAL_STRIP | 12 refills | Status: AC

## 2021-10-08 ENCOUNTER — Other Ambulatory Visit: Payer: Self-pay

## 2021-10-08 MED ORDER — ACCU-CHEK SOFTCLIX LANCETS MISC: 12 refills | Status: DC

## 2021-10-10 NOTE — Patient Instructions (Incomplete)
It was nice seeing you today!  Blood work today.  See me in 3 months or whenever is a good for you.  Stay well, Culver Feighner, MD [facility] [phone]  --  Make sure to check out at the front desk before you leave today.  Please arrive at least 15 minutes prior to your scheduled appointments.  If you had blood work today, I will send you a MyChart message or a letter if results are normal. Otherwise, I will give you a call.  If you had a referral placed, they will call you to set up an appointment. Please give us a call if you don't hear back in the next 2 weeks.  If you need additional refills before your next appointment, please call your pharmacy first.  

## 2021-10-10 NOTE — Progress Notes (Deleted)
    SUBJECTIVE:   CHIEF COMPLAINT / HPI:  No chief complaint on file.   T2DM Medications include: Tyler Aas *** units, Novolog ***, metformin *** daily  PERTINENT  PMH / PSH: T2DM, HTN, seizure disorder, COPD, sarcoidosis, Long QT syndrome s/p ICD implantation 2016  Patient Care Team: Zola Button, MD as PCP - General (Family Medicine) Evans Lance, MD as PCP - Cardiology (Cardiology) Evans Lance, MD as PCP - Electrophysiology (Cardiology) Cameron Sprang, MD as Consulting Physician (Neurology)   OBJECTIVE:   LMP 07/23/2014   Physical Exam      02/28/2020    9:52 AM  Depression screen PHQ 2/9  Decreased Interest 1  Down, Depressed, Hopeless 0  PHQ - 2 Score 1  Altered sleeping 2  Tired, decreased energy 3  Change in appetite 3  Feeling bad or failure about yourself  1  Trouble concentrating 2  Moving slowly or fidgety/restless 2  Suicidal thoughts 0  PHQ-9 Score 14     {Show previous vital signs (optional):23777}  {Labs  Heme  Chem  Endocrine  Serology  Results Review (optional):23779}  ASSESSMENT/PLAN:   No problem-specific Assessment & Plan notes found for this encounter.   HCM - pap smear - Tdap - Covid vaccine - diabetic eye exam  No follow-ups on file.   Zola Button, MD [place]

## 2021-10-14 ENCOUNTER — Encounter: Payer: Medicaid Other | Admitting: Family Medicine

## 2021-10-15 NOTE — Patient Instructions (Addendum)
It was nice seeing you today!  Try doing exercises at home most days of the week.  Increase metformin to 750 mg twice a day.  Increase Tresiba to 70 units.  Try to get a blood pressure cuff and check your blood pressure most days of the week.  [facility] Address: [address], [place], [place] [address] Phone: [phone]   Stay well, Zola Button, MD [place] [phone]  --  Make sure to check out at the front desk before you leave today.  Please arrive at least 15 minutes prior to your scheduled appointments.  If you had blood work today, I will send you a MyChart message or a letter if results are normal. Otherwise, I will give you a call.  If you had a referral placed, they will call you to set up an appointment. Please give [place] a call if you don't hear back in the next 2 weeks.  If you need additional refills before your next appointment, please call your pharmacy first.
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
## 2021-10-21 ENCOUNTER — Encounter: Payer: Self-pay | Admitting: Obstetrics

## 2021-10-21 ENCOUNTER — Ambulatory Visit (INDEPENDENT_AMBULATORY_CARE_PROVIDER_SITE_OTHER): Payer: Medicaid Other | Admitting: Obstetrics

## 2021-10-21 ENCOUNTER — Other Ambulatory Visit (HOSPITAL_COMMUNITY)
Admission: RE | Admit: 2021-10-21 | Discharge: 2021-10-21 | Disposition: A | Discharge status: Home / Self Care | Payer: Medicaid Other | Source: Ambulatory Visit | Attending: Obstetrics | Admitting: Obstetrics

## 2021-10-21 ENCOUNTER — Encounter: Payer: Self-pay | Admitting: *Deleted

## 2021-10-21 VITALS — BP 168/83 | HR 78 | Ht 66.5 in | Wt 178.7 lb

## 2021-10-21 DIAGNOSIS — Z90710 Acquired absence of both cervix and uterus: Secondary | ICD-10-CM

## 2021-10-21 DIAGNOSIS — R030 Elevated blood-pressure reading, without diagnosis of hypertension: Secondary | ICD-10-CM | POA: Diagnosis not present

## 2021-10-21 DIAGNOSIS — Z9079 Acquired absence of other genital organ(s): Secondary | ICD-10-CM

## 2021-10-21 DIAGNOSIS — Z1239 Encounter for other screening for malignant neoplasm of breast: Secondary | ICD-10-CM

## 2021-10-21 DIAGNOSIS — Z113 Encounter for screening for infections with a predominantly sexual mode of transmission: Secondary | ICD-10-CM

## 2021-10-21 DIAGNOSIS — N898 Other specified noninflammatory disorders of vagina: Secondary | ICD-10-CM

## 2021-10-21 DIAGNOSIS — Z01419 Encounter for gynecological examination (general) (routine) without abnormal findings: Secondary | ICD-10-CM

## 2021-10-21 DIAGNOSIS — Z90722 Acquired absence of ovaries, bilateral: Secondary | ICD-10-CM | POA: Diagnosis not present

## 2021-10-21 NOTE — Progress Notes (Signed)
Patient presents for AEX. Patient complains of having vaginal itching.
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
## 2021-10-22 LAB — CERVICOVAGINAL ANCILLARY ONLY
Bacterial Vaginitis (gardnerella): POSITIVE — AB
Candida Glabrata: NEGATIVE
Candida Vaginitis: POSITIVE — AB
Chlamydia: NEGATIVE
Comment: NEGATIVE
Comment: NEGATIVE
Comment: NEGATIVE
Comment: NEGATIVE
Comment: NEGATIVE
Comment: NORMAL
Neisseria Gonorrhea: NEGATIVE
Trichomonas: NEGATIVE

## 2021-10-22 LAB — RPR: RPR Ser Ql: NONREACTIVE

## 2021-10-22 LAB — HEPATITIS C ANTIBODY: Hep C Virus Ab: NONREACTIVE

## 2021-10-22 LAB — HEPATITIS B SURFACE ANTIGEN: Hepatitis B Surface Ag: NEGATIVE

## 2021-10-22 LAB — HIV ANTIBODY (ROUTINE TESTING W REFLEX): HIV Screen 4th Generation wRfx: NONREACTIVE

## 2021-10-23 ENCOUNTER — Other Ambulatory Visit: Payer: Self-pay | Admitting: Obstetrics

## 2021-10-23 DIAGNOSIS — B9689 Other specified bacterial agents as the cause of diseases classified elsewhere: Secondary | ICD-10-CM

## 2021-10-23 DIAGNOSIS — B3731 Acute candidiasis of vulva and vagina: Secondary | ICD-10-CM

## 2021-10-23 MED ORDER — FLUCONAZOLE 150 MG PO TABS: ORAL_TABLET | ORAL | 0 refills | Status: DC

## 2021-10-23 MED ORDER — METRONIDAZOLE 500 MG PO TABS: 500.0000 mg | ORAL_TABLET | Freq: Two times a day (BID) | ORAL | 0 refills | Status: DC

## 2021-10-26 ENCOUNTER — Emergency Department (HOSPITAL_COMMUNITY)
Admission: EM | Admit: 2021-10-26 | Discharge: 2021-10-26 | Disposition: A | Discharge status: Home / Self Care | Payer: Medicaid Other | Attending: Emergency Medicine | Admitting: Emergency Medicine

## 2021-10-26 ENCOUNTER — Other Ambulatory Visit: Payer: Self-pay

## 2021-10-26 ENCOUNTER — Encounter (HOSPITAL_COMMUNITY): Payer: Self-pay

## 2021-10-26 ENCOUNTER — Emergency Department (HOSPITAL_COMMUNITY): Payer: Medicaid Other

## 2021-10-26 DIAGNOSIS — S60042A Contusion of left ring finger without damage to nail, initial encounter: Secondary | ICD-10-CM | POA: Insufficient documentation

## 2021-10-26 DIAGNOSIS — Z7982 Long term (current) use of aspirin: Secondary | ICD-10-CM | POA: Insufficient documentation

## 2021-10-26 DIAGNOSIS — S60052A Contusion of left little finger without damage to nail, initial encounter: Secondary | ICD-10-CM | POA: Diagnosis not present

## 2021-10-26 DIAGNOSIS — R0789 Other chest pain: Secondary | ICD-10-CM | POA: Insufficient documentation

## 2021-10-26 DIAGNOSIS — R079 Chest pain, unspecified: Secondary | ICD-10-CM | POA: Diagnosis not present

## 2021-10-26 DIAGNOSIS — Z23 Encounter for immunization: Secondary | ICD-10-CM | POA: Diagnosis not present

## 2021-10-26 DIAGNOSIS — S90111A Contusion of right great toe without damage to nail, initial encounter: Secondary | ICD-10-CM | POA: Diagnosis not present

## 2021-10-26 DIAGNOSIS — I10 Essential (primary) hypertension: Secondary | ICD-10-CM | POA: Diagnosis not present

## 2021-10-26 DIAGNOSIS — M7989 Other specified soft tissue disorders: Secondary | ICD-10-CM | POA: Diagnosis not present

## 2021-10-26 DIAGNOSIS — S90931A Unspecified superficial injury of right great toe, initial encounter: Secondary | ICD-10-CM | POA: Diagnosis present

## 2021-10-26 DIAGNOSIS — Z79899 Other long term (current) drug therapy: Secondary | ICD-10-CM | POA: Insufficient documentation

## 2021-10-26 DIAGNOSIS — S90411A Abrasion, right great toe, initial encounter: Secondary | ICD-10-CM | POA: Insufficient documentation

## 2021-10-26 DIAGNOSIS — Z794 Long term (current) use of insulin: Secondary | ICD-10-CM | POA: Diagnosis not present

## 2021-10-26 DIAGNOSIS — T07XXXA Unspecified multiple injuries, initial encounter: Secondary | ICD-10-CM

## 2021-10-26 DIAGNOSIS — Z0471 Encounter for examination and observation following alleged adult physical abuse: Secondary | ICD-10-CM | POA: Diagnosis not present

## 2021-10-26 MED ORDER — TETANUS-DIPHTH-ACELL PERTUSSIS 5-2.5-18.5 LF-MCG/0.5 IM SUSY
0.5000 mL | PREFILLED_SYRINGE | Freq: Once | INTRAMUSCULAR | Status: AC
  Administered 2021-10-26: 0.5 mL via INTRAMUSCULAR
  Filled 2021-10-26: qty 0.5
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
## 2021-10-26 NOTE — ED Notes (Signed)
Left great toe cleaned and bandaged
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
## 2021-10-27 ENCOUNTER — Telehealth: Payer: Self-pay

## 2021-10-27 ENCOUNTER — Encounter: Payer: Self-pay | Admitting: Family Medicine

## 2021-10-27 ENCOUNTER — Ambulatory Visit (INDEPENDENT_AMBULATORY_CARE_PROVIDER_SITE_OTHER): Payer: Medicaid Other | Admitting: Family Medicine

## 2021-10-27 VITALS — BP 117/97 | HR 79 | Wt 176.0 lb

## 2021-10-27 DIAGNOSIS — E1159 Type 2 diabetes mellitus with other circulatory complications: Secondary | ICD-10-CM

## 2021-10-27 DIAGNOSIS — I152 Hypertension secondary to endocrine disorders: Secondary | ICD-10-CM

## 2021-10-27 DIAGNOSIS — E119 Type 2 diabetes mellitus without complications: Secondary | ICD-10-CM | POA: Diagnosis not present

## 2021-10-27 DIAGNOSIS — Z794 Long term (current) use of insulin: Secondary | ICD-10-CM

## 2021-10-27 DIAGNOSIS — M5442 Lumbago with sciatica, left side: Secondary | ICD-10-CM | POA: Diagnosis not present

## 2021-10-27 DIAGNOSIS — G8929 Other chronic pain: Secondary | ICD-10-CM | POA: Diagnosis not present

## 2021-10-27 DIAGNOSIS — S90819A Abrasion, unspecified foot, initial encounter: Secondary | ICD-10-CM

## 2021-10-27 DIAGNOSIS — Z13220 Encounter for screening for lipoid disorders: Secondary | ICD-10-CM | POA: Diagnosis not present

## 2021-10-27 LAB — POCT GLYCOSYLATED HEMOGLOBIN (HGB A1C): HbA1c, POC (controlled diabetic range): 10.9 % — AB (ref 0.0–7.0)

## 2021-10-27 MED ORDER — GABAPENTIN 300 MG PO CAPS: 300.0000 mg | ORAL_CAPSULE | Freq: Three times a day (TID) | ORAL | 2 refills | Status: DC

## 2021-10-27 MED ORDER — METFORMIN HCL ER 750 MG PO TB24: 750.0000 mg | ORAL_TABLET | Freq: Two times a day (BID) | ORAL | 2 refills | Status: DC

## 2021-10-27 NOTE — Assessment & Plan Note (Addendum)
Mildly elevated DBP. Check BMP. F/u 1 month, consider ARB if remains elevated.

## 2021-10-27 NOTE — Assessment & Plan Note (Signed)
Last CT in 2018 unremarkable. Reports worsening of pain. - DG lumbar spine - sciatica exercises provided, consider PT referral if not helping

## 2021-10-27 NOTE — Assessment & Plan Note (Addendum)
Poorly controlled, worsening. - increase Tresiba to 70 units - continue Novolog sliding scale - increase metformin to 750 mg BID - gabapentin refilled - lipid panel; will discuss statin at next visit - urine microalbumin

## 2021-10-27 NOTE — Telephone Encounter (Signed)
Transition Care Management Unsuccessful Follow-up Telephone Call  Date of discharge and from where:  10/26/2021 [facility]  Attempts:  1st Attempt  Reason for unsuccessful TCM follow-up call:  Voice mail full   Patient has an appt with PCP today.

## 2021-10-28 LAB — BASIC METABOLIC PANEL
BUN/Creatinine Ratio: 9 (ref 9–23)
BUN: 6 mg/dL (ref 6–24)
CO2: 21 mmol/L (ref 20–29)
Calcium: 9.1 mg/dL (ref 8.7–10.2)
Chloride: 98 mmol/L (ref 96–106)
Creatinine, Ser: 0.65 mg/dL (ref 0.57–1.00)
Glucose: 280 mg/dL — ABNORMAL HIGH (ref 70–99)
Potassium: 3.7 mmol/L (ref 3.5–5.2)
Sodium: 136 mmol/L (ref 134–144)
eGFR: 107 mL/min/{1.73_m2} (ref >59)

## 2021-10-28 LAB — MICROALBUMIN / CREATININE URINE RATIO
Creatinine, Urine: 169.1 mg/dL
Microalb/Creat Ratio: 31 mg/g creat — ABNORMAL HIGH (ref 0–29)
Microalbumin, Urine: 52.3 ug/mL

## 2021-10-28 LAB — LIPID PANEL
Chol/HDL Ratio: 4.2 ratio (ref 0.0–4.4)
Cholesterol, Total: 177 mg/dL (ref 100–199)
HDL: 42 mg/dL (ref >39)
LDL Chol Calc (NIH): 112 mg/dL — ABNORMAL HIGH (ref 0–99)
Triglycerides: 126 mg/dL (ref 0–149)
VLDL Cholesterol Cal: 23 mg/dL (ref 5–40)

## 2021-10-30 ENCOUNTER — Telehealth: Payer: Self-pay

## 2021-10-30 ENCOUNTER — Ambulatory Visit
Admission: RE | Admit: 2021-10-30 | Discharge: 2021-10-30 | Disposition: A | Discharge status: Home / Self Care | Payer: Medicaid Other | Source: Ambulatory Visit | Attending: Family Medicine | Admitting: Family Medicine

## 2021-10-30 DIAGNOSIS — M545 Low back pain, unspecified: Secondary | ICD-10-CM | POA: Diagnosis not present

## 2021-10-30 DIAGNOSIS — G8929 Other chronic pain: Secondary | ICD-10-CM
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
## 2021-10-31 ENCOUNTER — Other Ambulatory Visit: Payer: Self-pay | Admitting: *Deleted

## 2021-10-31 DIAGNOSIS — B3731 Acute candidiasis of vulva and vagina: Secondary | ICD-10-CM

## 2021-10-31 MED ORDER — FLUCONAZOLE 150 MG PO TABS: 150.0000 mg | ORAL_TABLET | Freq: Once | ORAL | 0 refills | Status: AC

## 2021-10-31 NOTE — Progress Notes (Signed)
TC from pt reporting recent treatment for yeast and BV. Yeast returned after BV treatment. RX Diflucan per protocol.

## 2021-11-04 ENCOUNTER — Telehealth: Payer: Self-pay | Admitting: Internal Medicine
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
## 2021-11-04 NOTE — Telephone Encounter (Signed)
Spoke with patient, informed her that due to the circumstances under which she is requesting this information I would have to send this to my manager in order to make sure the proper channels are followed. Informed patient that it could be a week until we hear something back. Patient vouced understanding

## 2021-11-05 NOTE — Telephone Encounter (Signed)
Left detailed message for Pt advising paperwork requested is available for pick up at front desk.

## 2021-11-07 ENCOUNTER — Telehealth: Payer: Self-pay | Admitting: Internal Medicine

## 2021-11-19 ENCOUNTER — Ambulatory Visit (INDEPENDENT_AMBULATORY_CARE_PROVIDER_SITE_OTHER): Payer: Medicaid Other

## 2021-11-19 DIAGNOSIS — I4581 Long QT syndrome: Secondary | ICD-10-CM

## 2021-11-21 ENCOUNTER — Telehealth: Payer: Self-pay | Admitting: Internal Medicine

## 2021-11-21 MED ORDER — PROPRANOLOL HCL 80 MG PO TABS: 80.0000 mg | ORAL_TABLET | Freq: Two times a day (BID) | ORAL | 0 refills | Status: DC

## 2021-11-21 NOTE — Telephone Encounter (Signed)
Pt's medication was sent to pt's pharmacy as requested. Confirmation received.  °

## 2021-11-21 NOTE — Telephone Encounter (Signed)
*  STAT* If patient is at the pharmacy, call can be transferred to refill team.   1. Which medications need to be refilled? (please list name of each medication and dose if known)    propranolol (INDERAL) 80 MG tablet    2. Which pharmacy/location (including street and city if local pharmacy) is medication to be sent to? [facility] [phone] - [place], [place] - [address]  3. Do they need a 30 day or 90 day supply? 90  Pt is completely out of the medication. She scheduled an appt with Dr. Lovena Le 02/05/22 at 12:00pm

## 2021-11-22 LAB — CUP PACEART REMOTE DEVICE CHECK
Battery Remaining Longevity: 62 mo
Battery Voltage: 3 V
Brady Statistic RV Percent Paced: 0.01 %
Date Time Interrogation Session: 20230705012406
HighPow Impedance: 55 Ohm
HighPow Impedance: 84 Ohm
Implantable Lead Implant Date: 20080828
Implantable Lead Location: 753860
Implantable Lead Model: 6947
Implantable Pulse Generator Implant Date: 20161117
Lead Channel Impedance Value: 380 Ohm
Lead Channel Impedance Value: 437 Ohm
Lead Channel Pacing Threshold Amplitude: 1.375 V
Lead Channel Pacing Threshold Pulse Width: 0.4 ms
Lead Channel Sensing Intrinsic Amplitude: 4.875 mV
Lead Channel Sensing Intrinsic Amplitude: 4.875 mV
Lead Channel Setting Pacing Amplitude: 3 V
Lead Channel Setting Pacing Pulse Width: 0.4 ms
Lead Channel Setting Sensing Sensitivity: 0.3 mV

## 2021-11-24 NOTE — Progress Notes (Deleted)
    SUBJECTIVE:   CHIEF COMPLAINT / HPI:  No chief complaint on file.   Chronic back pain Worsening back pain at last visit. XR showed minor degenerative changes. Exercises provided.  T2DM Medications include Tresiba 70 units daily, Novolog sliding scale, metformin 750 mg BID. Statin*** Home readings  PERTINENT  PMH / PSH: T2DM, chronic back pain, HTN, seizure disorder, COPD, sarcoidosis, Long QT syndrome s/p ICD implantation 2016  Patient Care Team: Zola Button, MD as PCP - General (Family Medicine) Evans Lance, MD as PCP - Cardiology (Cardiology) Evans Lance, MD as PCP - Electrophysiology (Cardiology) Cameron Sprang, MD as Consulting Physician (Neurology)   OBJECTIVE:   LMP 07/23/2014   Physical Exam      10/27/2021    2:10 PM  Depression screen PHQ 2/9  Decreased Interest 2  Down, Depressed, Hopeless 0  PHQ - 2 Score 2  Altered sleeping 2  Tired, decreased energy 2  Change in appetite 1  Feeling bad or failure about yourself  1  Trouble concentrating 2  Moving slowly or fidgety/restless 2  Suicidal thoughts 0  PHQ-9 Score 12  Difficult doing work/chores Not difficult at all     {Show previous vital signs (optional):23777}  {Labs  Heme  Chem  Endocrine  Serology  Results Review (optional):23779}  ASSESSMENT/PLAN:   No problem-specific Assessment & Plan notes found for this encounter.    No follow-ups on file.   Zola Button, MD [place]

## 2021-11-25 ENCOUNTER — Ambulatory Visit: Payer: Medicaid Other | Admitting: Family Medicine

## 2021-12-08 NOTE — Patient Instructions (Incomplete)
It was nice seeing you today!  Start taking atorvastatin (Lipitor) once a day to help lower cholesterol.  Increase Tresiba to 40 units twice a day. Take Novolog 8 units twice a day with meals.  Referral for physical therapy. You can try Tylenol and ibuprofen together for back pain.  Please give [place] a call if you are getting fasting sugars <100.  Make an appointment with Dr. Valentina Lucks in 1 month for diabetes.  Follow-up with me in 2 months.  Stay well, Loretta Button, MD [place] [phone]  --  Make sure to check out at the front desk before you leave today.  Please arrive at least 15 minutes prior to your scheduled appointments.  If you had blood work today, I will send you a MyChart message or a letter if results are normal. Otherwise, I will give you a call.  If you had a referral placed, they will call you to set up an appointment. Please give [place] a call if you don't hear back in the next 2 weeks.  If you need additional refills before your next appointment, please call your pharmacy first.  --  Therapy and Counseling Resources Most providers on this list will take Medicaid. Patients with commercial insurance or Medicare should contact their insurance company to get a list of in network providers.  [employer] [employer] (takes children) Location 1: [address], [place], [place] [address] Location 2: [place], [place] [address] [place] ([place] speaking therapist available)(habla espanol)(take medicare and medicaid)  [place], [place], [place] [address], [place] [email or web address] [phone]  [facility]:Psychiatry and Counseling [address] [place], [place] [address] [place], [place], [place] [address]      [phone]  [place] (spanish available) [place], [place]  [address] [place] (take [facility] and medicare) [address] [place], [place] [address]       [phone]     [place] (virtual only) [phone]  [facility] [address], [place], [place]  [facility]:  [place]. [place] [phone]  [facility]:  [place] [address] [place] [phone]  [facility] (under & uninsured) [address], [place] [phone]    [email or web address]    [place] [address]  [place]    [phone]  [facility]     Phone:  [phone]     [place]  [place] [address]      [place], [place] [address] [place]: [place], [place], [place]   [place] ([place] therapist) [email or web address]  [place] [address]   [place] [address]    [phone]    [facility]   [address],  [place], [place]   [place]  [place]  [facility]  [address] [place], [place]        [phone]  Open Access/Walk In Clinic under & uninsured  [facility]  [address] [place], [place]  Line [phone] Crisis [phone]  [facility] [place],  ([place])   [place], [place]: [phone]) 8:30 - 12; 1 - 2:30  [facility] of the [place],  [place], [place]    [phone]):8:30 - 12; 2 - 3PM  [facility] [employer],  [address],  [place]; [phone]):   Mon - Fri 8 AM - 5 PM  [facility]  MWF 12:30 to 3:00 or call  to schedule an appointment  [phone]  Specific Provider options Psychology Today  [email or web address] click on find a therapist  enter your zip code left side and select or tailor a therapist for your specific need.   [facility] Provider Directory [email or web address]  (Medicaid)   Follow all drop down to find a provider  [place] or [email or web address] [address], [place], [place] Recovery support and educational   24- Hour Availability:   [facility]  [address] [place], [place] Crisis [phone]  [facility] of the [employer] [phone]  [place]  [phone]   [place]  [phone] (after hours)  Therapeutic Alternative/Mobile Crisis   [phone]  [place] National Suicide Hotline  [phone] Diamantina Monks)  Call 911 or go to emergency room  [facility]  [phone]);  [place] and [employer]  [phone]); [place], [place], [place], [place], [place], [place], [place]

## 2021-12-08 NOTE — Progress Notes (Unsigned)
    SUBJECTIVE:   CHIEF COMPLAINT / HPI:  No chief complaint on file.   T2DM Medications include: Tresiba 70 units, Novolog sliding scale, metformin 750 mg BID Statin***  PERTINENT  PMH / PSH: T2DM, HTN, seizure disorder, COPD, sarcoidosis, Long QT syndrome s/p ICD implantation 2016  Patient Care Team: Zola Button, MD as PCP - General (Family Medicine) Evans Lance, MD as PCP - Cardiology (Cardiology) Evans Lance, MD as PCP - Electrophysiology (Cardiology) Cameron Sprang, MD as Consulting Physician (Neurology)   OBJECTIVE:   LMP 07/23/2014   Physical Exam      10/27/2021    2:10 PM  Depression screen PHQ 2/9  Decreased Interest 2  Down, Depressed, Hopeless 0  PHQ - 2 Score 2  Altered sleeping 2  Tired, decreased energy 2  Change in appetite 1  Feeling bad or failure about yourself  1  Trouble concentrating 2  Moving slowly or fidgety/restless 2  Suicidal thoughts 0  PHQ-9 Score 12  Difficult doing work/chores Not difficult at all     {Show previous vital signs (optional):23777}  {Labs  Heme  Chem  Endocrine  Serology  Results Review (optional):23779}  ASSESSMENT/PLAN:   No problem-specific Assessment & Plan notes found for this encounter.   HCM - DM eye exam - pap smear - shingles vaccine  No follow-ups on file.   Zola Button, MD [place]

## 2021-12-09 ENCOUNTER — Telehealth: Payer: Self-pay

## 2021-12-09 ENCOUNTER — Encounter: Payer: Self-pay | Admitting: Family Medicine

## 2021-12-09 ENCOUNTER — Ambulatory Visit (INDEPENDENT_AMBULATORY_CARE_PROVIDER_SITE_OTHER): Payer: Medicaid Other | Admitting: Family Medicine

## 2021-12-09 VITALS — BP 122/79 | HR 62 | Resp 18 | Wt 176.0 lb

## 2021-12-09 DIAGNOSIS — G8929 Other chronic pain: Secondary | ICD-10-CM

## 2021-12-09 DIAGNOSIS — M5442 Lumbago with sciatica, left side: Secondary | ICD-10-CM

## 2021-12-09 DIAGNOSIS — I152 Hypertension secondary to endocrine disorders: Secondary | ICD-10-CM

## 2021-12-09 DIAGNOSIS — E1169 Type 2 diabetes mellitus with other specified complication: Secondary | ICD-10-CM | POA: Diagnosis not present

## 2021-12-09 DIAGNOSIS — E119 Type 2 diabetes mellitus without complications: Secondary | ICD-10-CM | POA: Diagnosis not present

## 2021-12-09 DIAGNOSIS — E1159 Type 2 diabetes mellitus with other circulatory complications: Secondary | ICD-10-CM

## 2021-12-09 DIAGNOSIS — Z794 Long term (current) use of insulin: Secondary | ICD-10-CM

## 2021-12-09 DIAGNOSIS — R4589 Other symptoms and signs involving emotional state: Secondary | ICD-10-CM | POA: Diagnosis not present

## 2021-12-09 DIAGNOSIS — E785 Hyperlipidemia, unspecified: Secondary | ICD-10-CM | POA: Diagnosis not present

## 2021-12-09 MED ORDER — NOVOLOG FLEXPEN 100 UNIT/ML ~~LOC~~ SOPN: PEN_INJECTOR | SUBCUTANEOUS | 0 refills | Status: DC

## 2021-12-09 MED ORDER — ATORVASTATIN CALCIUM 10 MG PO TABS: 10.0000 mg | ORAL_TABLET | Freq: Every day | ORAL | 2 refills | Status: AC

## 2021-12-09 MED ORDER — TRESIBA FLEXTOUCH 200 UNIT/ML ~~LOC~~ SOPN: 40.0000 [IU] | PEN_INJECTOR | Freq: Two times a day (BID) | SUBCUTANEOUS | 0 refills | Status: DC

## 2021-12-09 NOTE — Assessment & Plan Note (Signed)
-   start atorvastatin 10 mg daily, uptitrate at future visits if well-tolerated

## 2021-12-09 NOTE — Assessment & Plan Note (Signed)
Stable but interfering with daily activities. - PT referral - advised can try Tylenol and ibuprofen together

## 2021-12-09 NOTE — Assessment & Plan Note (Signed)
Well-controlled today not on antihypertensives.  Consider ARB if persistently elevated in the future.

## 2021-12-09 NOTE — Addendum Note (Signed)
Addended by: Zola Button D on: 12/09/2021 12:25 PM   Modules accepted: Orders

## 2021-12-09 NOTE — Assessment & Plan Note (Signed)
Related to underlying chronic medical issues.  No SI. - therapy resources provided

## 2021-12-09 NOTE — Assessment & Plan Note (Signed)
Poorly controlled.  Fasting sugars remain elevated. - increase Tresiba to 40 units BID - switch Novolog to 8 units BIDAC - continue metformin 750 mg BID - consider GLP-1a if GI issues improve - follow-up in rx clinic in 1 month to assist with insulin titration - follow-up with PCP 2 months for A1c

## 2021-12-09 NOTE — Telephone Encounter (Signed)
Received phone call from [facility] regarding clarification on Tresiba.   Current directions indicate that patient is to inject 40 units twice daily and 60 units at 6 am.   Pharmacist will need updated prescription with clarified directions. Will forward to PCP.   Talbot Grumbling, RN

## 2021-12-09 NOTE — Telephone Encounter (Signed)
Sorry, that was leftover from the previous sig. It should be 40 units twice daily. I have resent the prescription.

## 2021-12-10 NOTE — Progress Notes (Signed)
Remote ICD transmission.   

## 2021-12-25 ENCOUNTER — Other Ambulatory Visit (HOSPITAL_COMMUNITY)
Admission: RE | Admit: 2021-12-25 | Discharge: 2021-12-25 | Disposition: A | Discharge status: Home / Self Care | Payer: Medicaid Other | Source: Ambulatory Visit | Attending: Medical | Admitting: Medical

## 2021-12-25 ENCOUNTER — Ambulatory Visit: Payer: Medicaid Other

## 2021-12-25 DIAGNOSIS — N898 Other specified noninflammatory disorders of vagina: Secondary | ICD-10-CM

## 2021-12-25 DIAGNOSIS — B3731 Acute candidiasis of vulva and vagina: Secondary | ICD-10-CM

## 2021-12-25 NOTE — Progress Notes (Signed)
..  SUBJECTIVE:  51 y.o. female complains of clear vaginal discharge for 1 week(s). Denies abnormal vaginal bleeding or significant pelvic pain or fever. No UTI symptoms. Denies history of known exposure to STD.  Patient's last menstrual period was 07/23/2014.  OBJECTIVE:  She appears well, afebrile. Urine dipstick: not done.  ASSESSMENT:  Vaginal Discharge  Vaginal Itching   PLAN:  GC, chlamydia, trichomonas, BVAG, CVAG probe sent to lab. Treatment: To be determined once lab results are received ROV prn if symptoms persist or worsen.

## 2021-12-26 LAB — CERVICOVAGINAL ANCILLARY ONLY
Bacterial Vaginitis (gardnerella): NEGATIVE
Candida Glabrata: NEGATIVE
Candida Vaginitis: POSITIVE — AB
Chlamydia: NEGATIVE
Comment: NEGATIVE
Comment: NEGATIVE
Comment: NEGATIVE
Comment: NEGATIVE
Comment: NEGATIVE
Comment: NORMAL
Neisseria Gonorrhea: NEGATIVE
Trichomonas: NEGATIVE

## 2021-12-29 MED ORDER — FLUCONAZOLE 150 MG PO TABS: 150.0000 mg | ORAL_TABLET | Freq: Once | ORAL | 0 refills | Status: AC

## 2021-12-29 NOTE — Addendum Note (Signed)
Addended by: Luvenia Redden on: 12/29/2021 12:21 PM   Modules accepted: Orders

## 2022-01-06 ENCOUNTER — Ambulatory Visit (INDEPENDENT_AMBULATORY_CARE_PROVIDER_SITE_OTHER): Payer: Medicaid Other | Admitting: Pharmacist

## 2022-01-06 ENCOUNTER — Encounter: Payer: Self-pay | Admitting: Pharmacist

## 2022-01-06 ENCOUNTER — Other Ambulatory Visit: Payer: Self-pay | Admitting: Emergency Medicine

## 2022-01-06 ENCOUNTER — Other Ambulatory Visit: Payer: Self-pay | Admitting: Obstetrics and Gynecology

## 2022-01-06 DIAGNOSIS — Z794 Long term (current) use of insulin: Secondary | ICD-10-CM | POA: Diagnosis not present

## 2022-01-06 DIAGNOSIS — E569 Vitamin deficiency, unspecified: Secondary | ICD-10-CM | POA: Diagnosis not present

## 2022-01-06 DIAGNOSIS — E119 Type 2 diabetes mellitus without complications: Secondary | ICD-10-CM

## 2022-01-06 MED ORDER — TRESIBA FLEXTOUCH 200 UNIT/ML ~~LOC~~ SOPN: 30.0000 [IU] | PEN_INJECTOR | Freq: Two times a day (BID) | SUBCUTANEOUS | 3 refills | Status: DC

## 2022-01-06 MED ORDER — GABAPENTIN 300 MG PO CAPS: 300.0000 mg | ORAL_CAPSULE | Freq: Three times a day (TID) | ORAL | 0 refills | Status: DC

## 2022-01-06 MED ORDER — METFORMIN HCL ER 500 MG PO TB24: 1000.0000 mg | ORAL_TABLET | Freq: Two times a day (BID) | ORAL | 3 refills | Status: AC

## 2022-01-06 MED ORDER — ACETAMINOPHEN ER 650 MG PO TBCR
650.0000 mg | EXTENDED_RELEASE_TABLET | Freq: Three times a day (TID) | ORAL | Status: DC | PRN
Start: 2022-01-06 — End: 2023-05-13

## 2022-01-06 MED ORDER — VITAFOL ULTRA 29-0.6-0.4-200 MG PO CAPS: 1.0000 | ORAL_CAPSULE | Freq: Every day | ORAL | 11 refills | Status: DC

## 2022-01-06 MED ORDER — FLUCONAZOLE 150 MG PO TABS: 150.0000 mg | ORAL_TABLET | Freq: Once | ORAL | 0 refills | Status: AC

## 2022-01-06 MED ORDER — NOVOLOG FLEXPEN 100 UNIT/ML ~~LOC~~ SOPN: 15.0000 [IU] | PEN_INJECTOR | Freq: Three times a day (TID) | SUBCUTANEOUS | 3 refills | Status: DC

## 2022-01-06 MED ORDER — FREESTYLE LIBRE 3 SENSOR MISC: 1.0000 | 3 refills | Status: DC

## 2022-01-06 NOTE — Patient Instructions (Addendum)
Tylenol 8 hour 650 mg twice daily Novolog take 15 units 3 times daily with meals Tyler Aas take 30 units twice daily Metformin take 750 mg XR once daily in the morning and 2x 500 mg tablets at night Restart gabapentin Restart prenatal multivitmanin daily Pick up continuous glucose monitor

## 2022-01-06 NOTE — Progress Notes (Unsigned)
Fluconazole reordered, approved by Constant, MD.

## 2022-01-06 NOTE — Assessment & Plan Note (Signed)
Diabetes longstanding, currently  and persistently uncontrolled. Patient is able to verbalize appropriate hypoglycemia management plan. Medication adherence appears good. Control is suboptimal due to non-optomized insulin regimen.  Despite GI complains in past, appears to be tolerating metformin '750mg'$  XR BID fairly well. -Decreased dose of basal insulin Tresiba (insulin degludec) from 40 twice daily to 30 units twice daily. -Increased dose of rapid insulin Novolog (insulin Aspart) from 6-8 units prior to meals to 15 units 3 times daily prior to meals.  -Increased dose of metformin 750 mg BID to 1000 mg BID, titrate to 1750 mg daily first; 750 mg in morning and 2x 500 mg at night. Then when she runs out of 750 mg tablets, switch to 1000 mg BID dose.  -Patient educated on purpose, proper use, and potential adverse effects of increased metformin dose.  - Ordered CGM - Libre 3 sensor.  Patient will return in 2 weeks for education and placement.

## 2022-01-06 NOTE — Progress Notes (Signed)
Reviewed: I agree with Dr. Koval's documentation and management. 

## 2022-01-06 NOTE — Progress Notes (Signed)
S:     Chief Complaint  Patient presents with   Medication Management    Diabetes   [name] is a 51 y.o. female who presents for diabetes evaluation, education, and management.  PMH is significant for COPD, seizure disorder, sarcoidosis and neuropathic pain.  Patient was referred and last seen by Primary Care Provider, Dr. Nancy Fetter, on 12/09/2021.   Today, patient arrives in good spirits and presents without any assistance.  Patient reports Diabetes was diagnosed in 2008.    Current diabetes medications include:Novolog sliding scale, up to 8 units with meals, reports taking 8 units 2/3 meals per day. Tresiba 40 units twice daily. Metformin XR 750 mg twice daily. Current hypertension medications include: propranolol 80 mg twice daily. Current hyperlipidemia medications include: Atorvastatin 10 mg daily  Patient reports adherence to taking all medications as prescribed. Requests refills on Gabapentin, Linzess, Prenate vitamin.   Do you feel that your medications are working for you? yes Have you been experiencing any side effects to the medications prescribed? Yes, GI issues with metformin, she feels it has improved over time. Do you have any problems obtaining medications due to transportation or finances? Yes, had to stop taking Salonpas because it became too expensive. Insurance coverage: Medicaid  Patient denies hypoglycemic events.  Reported home fasting blood sugars: mid 200s Reported 2 hour post-meal/random blood sugars: 200s.  Patient reports nocturia (nighttime urination). 2-3 times per night Patient reports neuropathy (nerve pain). Patient reports visual changes. Sees physician for this. Patient reports self foot exams.   Patient reported dietary habits: Eats 3 meals/day Breakfast: English muffin with Apple butter, boiled eggs Lunch: tuna pack Dinner: boiled chicken, baked salmon, boiled potatoes, fried veggies Snacks: banana, apple, occasional  dessert Drinks: glass of milk, ginger-ale, cranberry juice  Within the past 12 months, did you worry whether your food would run out before you got money to buy more? no Within the past 12 months, did the food you bought run out, and you didn't have money to get more? no  Patient-reported exercise habits: Wants to do physical therapy. In the past did piliates, walking, bike riding, shortness of breath onset is quick when exercising.   O:   Review of Systems  Eyes:  Positive for blurred vision.  Gastrointestinal:  Positive for abdominal pain, constipation (intermittently) and diarrhea (mild intermittent).  Genitourinary:  Positive for frequency.    Physical Exam Constitutional:      Appearance: Normal appearance. She is normal weight.  Pulmonary:     Effort: Pulmonary effort is normal.  Neurological:     Mental Status: She is alert.  Psychiatric:        Mood and Affect: Mood normal.        Behavior: Behavior normal.      Lab Results  Component Value Date   HGBA1C 10.9 (A) 10/27/2021   Vitals:   01/06/22 1031  BP: 132/76  Pulse: 67  SpO2: 98%    Lipid Panel     Component Value Date/Time   CHOL 177 10/27/2021 1516   TRIG 126 10/27/2021 1516   HDL 42 10/27/2021 1516   CHOLHDL 4.2 10/27/2021 1516   CHOLHDL 8.7 (H) 12/24/2014 1533   VLDL 47 (H) 12/24/2014 1533   LDLCALC 112 (H) 10/27/2021 1516    Clinical Atherosclerotic Cardiovascular Disease (ASCVD): Yes  The 10-year ASCVD risk score (Arnett DK, et al., 2019) is: 11.7%   Values used to calculate the score:     Age: 62  years     Sex: Female     Is Non-Hispanic African American: Yes     Diabetic: Yes     Tobacco smoker: No     Systolic Blood Pressure: 169 mmHg     Is BP treated: Yes     HDL Cholesterol: 42 mg/dL     Total Cholesterol: 177 mg/dL   Patient is participating in a Managed Medicaid Plan:  Yes    [name] has a diagnosis of diabetes, checks blood glucose readings multiple time per day,  treats with 4-5 insulin injections daily, and requires frequent adjustments to insulin regimen. This patient will be seen every six months, minimally, to assess adherence to their CGM regimen and diabetes treatment plan.    A/P: Diabetes longstanding, currently  and persistently uncontrolled. Patient is able to verbalize appropriate hypoglycemia management plan. Medication adherence appears good. Control is suboptimal due to non-optomized insulin regimen.  Despite GI complains in past, appears to be tolerating metformin '750mg'$  XR BID fairly well. -Decreased dose of basal insulin Tresiba (insulin degludec) from 40 twice daily to 30 units twice daily. -Increased dose of rapid insulin Novolog (insulin Aspart) from 6-8 units prior to meals to 15 units 3 times daily prior to meals.  -Increased dose of metformin 750 mg BID to 1000 mg BID, titrate to 1750 mg daily first; 750 mg in morning and 2x 500 mg at night. Then when she runs out of 750 mg tablets, switch to 1000 mg BID dose.  -Patient educated on purpose, proper use, and potential adverse effects of increased metformin dose.  - Ordered CGM - Libre 3 sensor.  Patient will return in 2 weeks for education and placement.  -Extensively discussed pathophysiology of diabetes, recommended lifestyle interventions, dietary effects on blood sugar control.  -Counseled on s/sx of and management of hypoglycemia.   ASCVD risk - secondary prevention in patient with diabetes. Last LDL is not at goal of <70 mg/dL. ASCVD risk factors include hx of stroke, diabetes. -Continued Atorvastatin 10 mg. Repeat direct LDL cholesterol in 2-3 months.  History of Neuropathy secondary to long-standing diabetes. Recently ran out of previously effective gabapentin '300mg'$  TID.  - Refilled gabapentin '300mg'$  TID.   Chronic pain currently fair control with use of acetaminophen '500mg'$  BID.  Unable to afford Salonpas patches used previously with success.  - Advised to purchase and initiate  acetaminophen '650mg'$  XR (8-hour) BID in place of '500mg'$  BID.  - Medication Samples have been provided to the patient for use with moderate to severe pain. Drug name: Salonpas (Lidocaine 4% patch)          Qty: 20  LOT: Ozzy.Hutch  Exp.Date: 09/05/2022  The patient has been instructed regarding the correct time, dose, and frequency of taking this medication, including desired effects and most common side effects.   Janeann Forehand 11:58 AM 01/06/2022  Prenatal multivitamin refilled per patient request for assistance with maintenance of iron levels.  Previously prescribed by GYN provider.  - Consider repeat iron panel, H/H in the future    Written patient instructions provided. Patient verbalized understanding of treatment plan.  Total time in face to face counseling 75 minutes.    Follow-up:  Pharmacist Nicholas Lose. PCP clinic visit 9/21/223 with Dr. Nancy Fetter.  Patient seen with Martina Sinner, PharmD Candidate.

## 2022-01-08 ENCOUNTER — Telehealth: Payer: Self-pay

## 2022-01-08 NOTE — Telephone Encounter (Signed)
A Prior Authorization was initiated for this patients FREESTYLE LIBRE 3 SENSOR through CoverMyMeds.   Key: Sharlot Gowda

## 2022-01-08 NOTE — Telephone Encounter (Signed)
Prior Auth for patients medication FREESTYLE LIBRE 3 SENSOR approved by [place] from 01/08/22 to 07/07/22.  Key: Sharlot Gowda

## 2022-01-20 ENCOUNTER — Ambulatory Visit: Payer: Medicaid Other | Admitting: Pharmacist

## 2022-01-20 ENCOUNTER — Encounter: Payer: Self-pay | Admitting: Pharmacist

## 2022-01-20 ENCOUNTER — Telehealth: Payer: Self-pay

## 2022-01-20 ENCOUNTER — Other Ambulatory Visit (HOSPITAL_COMMUNITY): Payer: Self-pay

## 2022-01-20 DIAGNOSIS — Z794 Long term (current) use of insulin: Secondary | ICD-10-CM

## 2022-01-20 DIAGNOSIS — E119 Type 2 diabetes mellitus without complications: Secondary | ICD-10-CM | POA: Diagnosis not present

## 2022-01-20 MED ORDER — NOVOLOG FLEXPEN 100 UNIT/ML ~~LOC~~ SOPN: 15.0000 [IU] | PEN_INJECTOR | Freq: Three times a day (TID) | SUBCUTANEOUS | 3 refills | Status: DC

## 2022-01-20 NOTE — Patient Instructions (Addendum)
It was nice to see you today! Changes we talked about today: Increase mealtime insulin aspart (Novolog) to 12 units with each meal When you finish the 750 mg metformin pills, start taking 2 tablets of the '500mg'$  twice daily (2,000 mg total daily) Continue taking Tresiba (insulin degludec) 30 units twice daily. Cut Tresiba to 25 units twice daily if you start to see low sugar levels (<100)

## 2022-01-20 NOTE — Progress Notes (Signed)
S:     Chief Complaint  Patient presents with   Medication Management    Diabetes Management   [name] is a 51 y.o. female who presents for diabetes evaluation, education, and management.  PMH is significant for DM, HTN.  Patient was referred and last seen by Primary Care Provider, Dr. Nancy Fetter, on 12/09/2021.  At last visit, we adjusted metformin dose to 1750 mg daily and ordered a CGM.   Today, patient arrives in good spirits and presents without any assistance.    Current diabetes medications include: Metformin 1750 mg daily, Tresiba (insulin degludec) 30 units twice daily, Novolog (insulin aspart) 6-8 units 3 times daily with meals Current hypertensive medications include: propranolol 80 mg twice daily Current hyperlipidemia medications include: Atorvastatin 10 mg daily  Patient reports adherence to taking all medications as prescribed.   Do you feel that your medications are working for you? yes Have you been experiencing any side effects to the medications prescribed? no Do you have any problems obtaining medications due to transportation or finances? no Insurance coverage: Medicaid prepaid  Patient denies hypoglycemic events.  Reported home fasting blood sugars: Ranging from 201-229. Reported 2 hour post-meal/random blood sugars: Mid 200s.  Patient reports nocturia (nighttime urination). Two times per night now, one less than last visit Patient reports neuropathy (nerve pain). Feels that the gabapentin is helping with that. Patient reports visual changes. Seeing eye doctor soon. Patient reports self foot exams.   Patient reported dietary habits: Eats 3 meals/day  Patient reports that the increased dose of metformin resulted in a transient increase in GI symptoms for the following 24 hours however, states she has been tolerating GI side effects well with '750mg'$  XR AM and '1000mg'$  XR  (2X '500mg'$ ) in the evening.   O:   Review of Systems  Gastrointestinal:   Negative for diarrhea.  All other systems reviewed and are negative.   Physical Exam Constitutional:      Appearance: Normal appearance. She is normal weight.  Pulmonary:     Effort: Pulmonary effort is normal.  Neurological:     Mental Status: She is alert.  Psychiatric:        Mood and Affect: Mood normal.        Behavior: Behavior normal.      Lab Results  Component Value Date   HGBA1C 10.9 (A) 10/27/2021   Vitals:   01/20/22 0943 01/20/22 0946  BP: (!) 159/76 (!) 144/70  Pulse: 84   SpO2: 97%     Lipid Panel     Component Value Date/Time   CHOL 177 10/27/2021 1516   TRIG 126 10/27/2021 1516   HDL 42 10/27/2021 1516   CHOLHDL 4.2 10/27/2021 1516   CHOLHDL 8.7 (H) 12/24/2014 1533   VLDL 47 (H) 12/24/2014 1533   LDLCALC 112 (H) 10/27/2021 1516     A/P: Diabetes longstanding, currently uncontrolled. Patient is able to verbalize appropriate hypoglycemia management plan. Medication adherence appears good. Control is suboptimal due to non-optimal regimen. Not able to start CGM today due to phone incompatibility. Will order a reader and look at sugar levels at the next follow-up visit.  -Continued basal insulin Tresiba (insulin degludec) 30 units twice daily. Informed patient to decrease to 25 units twice daily if she starts to see low sugar levels (<100) -Increased dose of rapid insulin Novolog (insulin aspart) to 12 units with breakfast, lunch and dinner.  -Increased dose of metformin from 1750 mg daily to 2,000 mg daily (  patient has this prescription in place).  -Patient educated on purpose, proper use, and potential adverse effects of increasing metformin dose.  -Extensively discussed pathophysiology of diabetes, recommended lifestyle interventions, dietary effects on blood sugar control.  -Counseled on s/sx of and management of hypoglycemia.   Written patient instructions provided. Patient verbalized understanding of treatment plan.  Total time in face to face  counseling 60 minutes.    Follow-up:  Pharmacist Dr. Valentina Lucks on September 28th. PCP clinic visit with Dr. Nancy Fetter on September 21st.  Patient seen with Martina Sinner, PharmD Candidate and  Jeneen Rinks, PharmD PGY-1 Resident. Marland Kitchen

## 2022-01-20 NOTE — Telephone Encounter (Signed)
Submitted order for [employer] 3 READER (comes as package with reader and sensor) via [employer].

## 2022-01-20 NOTE — Assessment & Plan Note (Signed)
Diabetes longstanding, currently uncontrolled. Patient is able to verbalize appropriate hypoglycemia management plan. Medication adherence appears good. Control is suboptimal due to non-optimal regimen. Not able to start CGM today due to phone incompatibility. Will order a reader and look at sugar levels at the next follow-up visit.  -Continued basal insulin Tresiba (insulin degludec) 30 units twice daily. Informed patient to decrease to 25 units twice daily if she starts to see low sugar levels (<100) -Increased dose of rapid insulin Novolog (insulin aspart) to 12 units with breakfast, lunch and dinner.  -Increased dose of metformin from 1750 mg daily to 2,000 mg daily (patient has this prescription in place).  -Patient educated on purpose, proper use, and potential adverse effects of increasing metformin dose.  -Extensively discussed pathophysiology of diabetes, recommended lifestyle interventions, dietary effects on blood sugar control.  -Counseled on s/sx of and management of hypoglycemia.

## 2022-01-26 ENCOUNTER — Other Ambulatory Visit (HOSPITAL_COMMUNITY): Payer: Self-pay

## 2022-01-26 NOTE — Telephone Encounter (Signed)
CGM order cancelled via parachute. Insurance only covers thru pharmacy.

## 2022-02-02 NOTE — Telephone Encounter (Signed)
Patient calls nurse line in regards to CGM supplies.   It appears [name] supplies need to be sent to her pharmacy in order for insurance to cover.   Will forward to PCP and [facility].

## 2022-02-03 ENCOUNTER — Other Ambulatory Visit (HOSPITAL_COMMUNITY): Payer: Self-pay

## 2022-02-03 MED ORDER — FREESTYLE LIBRE 2 READER DEVI: 1.0000 "application " | 0 refills | Status: DC

## 2022-02-03 NOTE — Addendum Note (Signed)
Addended by: Zola Button D on: 02/03/2022 12:39 PM   Modules accepted: Orders

## 2022-02-03 NOTE — Telephone Encounter (Signed)
Freestyle Libre 2 reader sent in, did not see an order for a Libre 3 reader.

## 2022-02-05 ENCOUNTER — Ambulatory Visit
Admission: RE | Admit: 2022-02-05 | Discharge: 2022-02-05 | Disposition: A | Discharge status: Home / Self Care | Payer: Medicaid Other | Source: Ambulatory Visit | Attending: Family Medicine | Admitting: Family Medicine

## 2022-02-05 ENCOUNTER — Telehealth: Payer: Self-pay

## 2022-02-05 ENCOUNTER — Ambulatory Visit (INDEPENDENT_AMBULATORY_CARE_PROVIDER_SITE_OTHER): Payer: Medicaid Other | Admitting: Family Medicine

## 2022-02-05 ENCOUNTER — Encounter: Payer: Self-pay | Admitting: Family Medicine

## 2022-02-05 ENCOUNTER — Ambulatory Visit: Payer: Medicaid Other | Attending: Internal Medicine | Admitting: Internal Medicine

## 2022-02-05 ENCOUNTER — Encounter: Payer: Self-pay | Admitting: Internal Medicine

## 2022-02-05 VITALS — BP 138/64 | Ht 66.0 in | Wt 178.8 lb

## 2022-02-05 VITALS — BP 135/82 | HR 63 | Ht 66.0 in | Wt 179.8 lb

## 2022-02-05 DIAGNOSIS — E119 Type 2 diabetes mellitus without complications: Secondary | ICD-10-CM

## 2022-02-05 DIAGNOSIS — Z95810 Presence of automatic (implantable) cardiac defibrillator: Secondary | ICD-10-CM | POA: Diagnosis not present

## 2022-02-05 DIAGNOSIS — Z794 Long term (current) use of insulin: Secondary | ICD-10-CM | POA: Diagnosis not present

## 2022-02-05 DIAGNOSIS — I471 Supraventricular tachycardia: Secondary | ICD-10-CM

## 2022-02-05 DIAGNOSIS — M5442 Lumbago with sciatica, left side: Secondary | ICD-10-CM | POA: Diagnosis not present

## 2022-02-05 DIAGNOSIS — M25561 Pain in right knee: Secondary | ICD-10-CM | POA: Diagnosis not present

## 2022-02-05 DIAGNOSIS — G8929 Other chronic pain: Secondary | ICD-10-CM

## 2022-02-05 DIAGNOSIS — M25562 Pain in left knee: Secondary | ICD-10-CM | POA: Diagnosis not present

## 2022-02-05 LAB — POCT GLYCOSYLATED HEMOGLOBIN (HGB A1C): HbA1c, POC (controlled diabetic range): 10.4 % — AB (ref 0.0–7.0)

## 2022-02-05 MED ORDER — PROPRANOLOL HCL 80 MG PO TABS: 80.0000 mg | ORAL_TABLET | Freq: Two times a day (BID) | ORAL | 3 refills | Status: DC

## 2022-02-05 NOTE — Progress Notes (Signed)
SUBJECTIVE:   CHIEF COMPLAINT / HPI:  Chief Complaint  Patient presents with   Follow-up    T2DM Medications include Tresiba 30 units twice daily, NovoLog 12 units 3 times daily with meals, metformin 1000 mg twice daily.   Recently seen in pharmacy clinic, working on getting her a CGM though she is having some insurance issues. Fasting 180s Postprandial low 200s   She reports that she has had difficulty sleeping recently due to back pain.  She has been taking melatonin without significant relief.  Last visit referred to PT for chronic back pain.  She never received a call for appointment.  She is also been having left knee pain that started about 2 months ago.  She has been trying various medications such as Tylenol, ibuprofen, Voltaren gel without significant relief.  She does feel with morning stiffness which does resolve within 30 minutes.  She notices more pain after sitting for prolonged periods.  She reports her handicap placard expired and needs a new one.  She has been on handicap for many years.  Not sure where she got her last handicap form from.  She does not have a form with her nor does she know where to get 1.  PERTINENT  PMH / PSH: T2DM, HTN, seizure disorder, COPD, sarcoidosis, Long QT syndrome s/p ICD implantation 2016  Patient Care Team: Zola Button, MD as PCP - General (Family Medicine) Evans Lance, MD as PCP - Cardiology (Cardiology) Evans Lance, MD as PCP - Electrophysiology (Cardiology) Cameron Sprang, MD as Consulting Physician (Neurology)   OBJECTIVE:   BP 135/82   Pulse 63   Ht '5\' 6"'$  (1.676 m)   Wt 179 lb 12.8 oz (81.6 kg)   LMP 07/23/2014   SpO2 98%   BMI 29.02 kg/m   Physical Exam Constitutional:      General: She is not in acute distress. Cardiovascular:     Rate and Rhythm: Normal rate and regular rhythm.  Pulmonary:     Effort: Pulmonary effort is normal. No respiratory distress.     Breath sounds: Normal breath sounds.   Musculoskeletal:     Cervical back: Neck supple.     Comments: No obvious swelling or deformity.  There is tenderness to palpation especially along the tibia/patellar tendon.  Some discomfort with extension and flexion.  Negative varus/valgus stressing.  Negative Lachman and posterior drawer.  McMurray negative.  Neurovascularly intact.  Neurological:     Mental Status: She is alert.         02/05/2022   10:00 AM  Depression screen PHQ 2/9  Down, Depressed, Hopeless 1  PHQ - 2 Score 1  Altered sleeping 2  Tired, decreased energy 3  Change in appetite 1  Feeling bad or failure about yourself  0  Trouble concentrating 2  Moving slowly or fidgety/restless 1  Suicidal thoughts 0  PHQ-9 Score 10     {Show previous vital signs (optional):23777}  Last hemoglobin A1c Lab Results  Component Value Date   HGBA1C 10.4 (A) 02/05/2022      ASSESSMENT/PLAN:   Diabetes mellitus, type 2 (HCC) A1c slightly improved 10.4 today, though remains uncontrolled.  Continue her current regimen for now, she does have follow-up with pharmacy clinic next week.  I think she may need to increase her basal insulin.  Chronic midline low back pain with left-sided sciatica PT contact information provided.  Pain is interfering with sleep, would not recommend any other sedative hypnotics at  this time.   Chronic left knee pain Intermittent, ongoing for about 2 months.  Suspect underlying OA.  Will obtain plain films and consider corticosteroid injection if patient is amenable. - DG knee bilateral standing  We will look into filling out handicap Placard paperwork.  Return in about 3 months (around 05/07/2022) for f/u DM.   Zola Button, MD [place]

## 2022-02-05 NOTE — Assessment & Plan Note (Signed)
PT contact information provided.  Pain is interfering with sleep, would not recommend any other sedative hypnotics at this time.

## 2022-02-05 NOTE — Patient Instructions (Addendum)
It was nice seeing you today!  [facility] Address: [address], [place], [place] [address] Phone: [phone]   Call PT for an appointment. [facility] Address: [place], [place],  [address] Phone: [phone]  Stay well, Zola Button, MD [place] [phone]  --  Make sure to check out at the front desk before you leave today.  Please arrive at least 15 minutes prior to your scheduled appointments.  If you had blood work today, I will send you a MyChart message or a letter if results are normal. Otherwise, I will give you a call.  If you had a referral placed, they will call you to set up an appointment. Please give [place] a call if you don't hear back in the next 2 weeks.  If you need additional refills before your next appointment, please call your pharmacy first.

## 2022-02-05 NOTE — Patient Instructions (Addendum)
Medication Instructions:  Your physician has recommended you make the following change in your medication: Medication REFILL:  Propranolol ( Inderal 80 Mg)    Take one tablet ( 80 Mg ) by mouth twice daily.    Lab Work: None ordered.  If you have labs (blood work) drawn today and your tests are completely normal, you will receive your results only by: [place] (if you have MyChart) OR A paper copy in the mail If you have any lab test that is abnormal or we need to change your treatment, we will call you to review the results.  Testing/Procedures: None ordered.  Follow-Up: At [facility], you and your health needs are our priority.  As part of our continuing mission to provide you with exceptional heart care, we have created designated Provider Care Teams.  These Care Teams include your primary Cardiologist (physician) and Advanced Practice Providers (APPs -  Physician Assistants and Nurse Practitioners) who all work together to provide you with the care you need, when you need it.  We recommend signing up for the patient portal called "MyChart".  Sign up information is provided on this After Visit Summary.  MyChart is used to connect with patients for Virtual Visits (Telemedicine).  Patients are able to view lab/test results, encounter notes, upcoming appointments, etc.  Non-urgent messages can be sent to your provider as well.   To learn more about what you can do with MyChart, go to [email or web address].    Handicapped application completed and given to patient.   Your next appointment:   Please schedule a 6 Month follow up appointment with Dr. Cristopher Peru.   The format for your next appointment:   In Person  Provider:   Cristopher Peru, MD{or one of the following Advanced Practice Providers on your designated Care Team:   Tommye Standard, [place] Legrand Como "Jonni Sanger" Chalmers Cater, [place]  Remote monitoring is used to monitor your ICD from home. This monitoring reduces the number of office  visits required to check your device to one time per year. It allows [place] to keep an eye on the functioning of your device to ensure it is working properly. You are scheduled for a device check from home on 02/17/22. You may send your transmission at any time that day. If you have a wireless device, the transmission will be sent automatically. After your physician reviews your transmission, you will receive a postcard with your next transmission date.  Important Information About Sugar

## 2022-02-05 NOTE — Telephone Encounter (Signed)
Attempted to reach patient to confirm which Freestyle Libre she is using 2 or 3? Salvatore Marvel, CMA
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
## 2022-02-05 NOTE — Assessment & Plan Note (Signed)
A1c slightly improved 10.4 today, though remains uncontrolled.  Continue her current regimen for now, she does have follow-up with pharmacy clinic next week.  I think she may need to increase her basal insulin.

## 2022-02-06 NOTE — Telephone Encounter (Signed)
Patient returns call to nurse line regarding issues with Cgm. She was requesting that we contact pharmacy. Called pharmacist. [place] 3 sensors are covered through insurance. Receiver is through an app on the patient's phone, therefore, prescription is not needed.   Attempted to call patient to provide update. She did not answer and VM box was full.   Talbot Grumbling, RN

## 2022-02-07 ENCOUNTER — Encounter: Payer: Self-pay | Admitting: Family Medicine

## 2022-02-12 ENCOUNTER — Ambulatory Visit: Payer: Medicaid Other | Admitting: Pharmacist

## 2022-02-12 MED ORDER — FREESTYLE LIBRE 2 SENSOR MISC
1.0000 | 11 refills | Status: DC
Start: 2022-02-12 — End: 2022-08-13

## 2022-02-12 NOTE — Addendum Note (Signed)
Addended by: Leavy Cella on: 02/12/2022 05:11 PM   Modules accepted: Orders

## 2022-02-12 NOTE — Telephone Encounter (Signed)
Attempted to contact patient RE [name] in place of [name]  Left brief message for patient that a new order for alternate sensor was being placed at her pharmacy.    I asked her to schedule an appointment for set up with me as soon as she gets her supply of sensor and reader.

## 2022-02-12 NOTE — Telephone Encounter (Signed)
Noted and agree. 

## 2022-02-12 NOTE — Telephone Encounter (Signed)
Patient calls nurse line reporting continued problems getting CGM supplies.   Patient reports she was able to pick up libre 3 sensors, however reports her phone is "too old" for receiver app. Patient reports they pharmacy stated we "needed to call them" to have [facility] 3 receiver approved.   I called pharmacy and spoke with pharmacist. Per pharmacist, the [facility] receiver can not be ordered. He suggested she use libre 2 as those supplies can be ordered.   Of note patient has already picked up libre 3 sensors.   Will forward to PCP and pharmacy team.

## 2022-02-13 ENCOUNTER — Telehealth: Payer: Self-pay | Admitting: Family Medicine

## 2022-02-13 NOTE — Telephone Encounter (Signed)
Called patient with x-ray results.  Overall unremarkable, joint spaces are actually preserved.  Discussed that we could consider corticosteroid injection however I would like for her diabetes to be better controlled.  I also relayed that previous handicap placard was completed by her pulmonologist.  I informed her that her CGM was switched to the [place] 2 and that it should have been sent to her pharmacy.  I relayed that Dr. Valentina Lucks wants to see her after she gets her new reader and sensor.  She has called physical therapy however there are some concerns about insurance coverage as she is transitioning insurance.  I strongly advised that she get into physical therapy if she is able to as I think it would greatly benefit her back pain and knee pain.

## 2022-02-17 ENCOUNTER — Ambulatory Visit (INDEPENDENT_AMBULATORY_CARE_PROVIDER_SITE_OTHER): Payer: Medicaid Other

## 2022-02-17 DIAGNOSIS — I4581 Long QT syndrome: Secondary | ICD-10-CM | POA: Diagnosis not present

## 2022-02-17 LAB — CUP PACEART REMOTE DEVICE CHECK
Battery Remaining Longevity: 60 mo
Battery Voltage: 2.99 V
Brady Statistic RV Percent Paced: 0.01 %
Date Time Interrogation Session: 20231003042508
HighPow Impedance: 50 Ohm
HighPow Impedance: 73 Ohm
Implantable Lead Implant Date: 20080828
Implantable Lead Location: 753860
Implantable Lead Model: 6947
Implantable Pulse Generator Implant Date: 20161117
Lead Channel Impedance Value: 323 Ohm
Lead Channel Impedance Value: 380 Ohm
Lead Channel Pacing Threshold Amplitude: 1.5 V
Lead Channel Pacing Threshold Pulse Width: 0.4 ms
Lead Channel Sensing Intrinsic Amplitude: 2.25 mV
Lead Channel Sensing Intrinsic Amplitude: 2.25 mV
Lead Channel Setting Pacing Amplitude: 3 V
Lead Channel Setting Pacing Pulse Width: 0.4 ms
Lead Channel Setting Sensing Sensitivity: 0.3 mV

## 2022-03-02 NOTE — Progress Notes (Signed)
Remote ICD transmission.   

## 2022-03-05 ENCOUNTER — Other Ambulatory Visit: Payer: Self-pay

## 2022-03-05 ENCOUNTER — Telehealth: Payer: Self-pay

## 2022-03-05 NOTE — Telephone Encounter (Signed)
Patient called stating that she is having vaginal itching. Denies having any discharge or odor. Requesting rx for diflucan.  Patient has a prolonged QT interval which is a contraindication for diflucan. Patient was last prescribed diflucan in 12/2021. Is it okay to refill?

## 2022-03-06 ENCOUNTER — Other Ambulatory Visit: Payer: Self-pay | Admitting: Obstetrics

## 2022-03-12 ENCOUNTER — Other Ambulatory Visit: Payer: Self-pay | Admitting: Obstetrics

## 2022-03-12 DIAGNOSIS — B379 Candidiasis, unspecified: Secondary | ICD-10-CM

## 2022-03-12 MED ORDER — AZO BORIC ACID 600 MG VA SUPP: 1.0000 | Freq: Every day | VAGINAL | 0 refills | Status: DC

## 2022-03-18 ENCOUNTER — Ambulatory Visit (INDEPENDENT_AMBULATORY_CARE_PROVIDER_SITE_OTHER): Payer: Medicaid Other | Admitting: General Practice

## 2022-03-18 ENCOUNTER — Other Ambulatory Visit (HOSPITAL_COMMUNITY)
Admission: RE | Admit: 2022-03-18 | Discharge: 2022-03-18 | Disposition: A | Discharge status: Home / Self Care | Payer: Medicaid Other | Source: Ambulatory Visit | Attending: Obstetrics and Gynecology | Admitting: Obstetrics and Gynecology

## 2022-03-18 VITALS — BP 164/93 | HR 74 | Ht 67.0 in | Wt 176.4 lb

## 2022-03-18 DIAGNOSIS — B3731 Acute candidiasis of vulva and vagina: Secondary | ICD-10-CM

## 2022-03-18 MED ORDER — FLUCONAZOLE 150 MG PO TABS: 150.0000 mg | ORAL_TABLET | Freq: Once | ORAL | 0 refills | Status: AC

## 2022-03-18 NOTE — Progress Notes (Signed)
SUBJECTIVE:  51 y.o. female complains vaginal itching for 1 week(s). Denies abnormal vaginal bleeding or significant pelvic pain or fever. No UTI symptoms. Denies history of known exposure to STD.  Patient's last menstrual period was 07/23/2014.  OBJECTIVE:  She appears well, afebrile. Urine dipstick: not done.  ASSESSMENT:  Vaginal Discharge  Vaginal Odor   PLAN:  GC, chlamydia, trichomonas, BVAG, CVAG probe sent to lab. Treatment: Diflucan sent to pharmacy per protocol for symptoms. Further treatment to be determined once lab results are received ROV prn if symptoms persist or worsen.

## 2022-03-19 LAB — CERVICOVAGINAL ANCILLARY ONLY
Bacterial Vaginitis (gardnerella): POSITIVE — AB
Candida Glabrata: NEGATIVE
Candida Vaginitis: POSITIVE — AB
Chlamydia: NEGATIVE
Comment: NEGATIVE
Comment: NEGATIVE
Comment: NEGATIVE
Comment: NEGATIVE
Comment: NEGATIVE
Comment: NORMAL
Neisseria Gonorrhea: NEGATIVE
Trichomonas: NEGATIVE

## 2022-03-20 ENCOUNTER — Ambulatory Visit: Payer: Medicaid Other | Admitting: Obstetrics

## 2022-03-20 ENCOUNTER — Other Ambulatory Visit: Payer: Self-pay

## 2022-03-20 DIAGNOSIS — B9689 Other specified bacterial agents as the cause of diseases classified elsewhere: Secondary | ICD-10-CM

## 2022-03-20 MED ORDER — METRONIDAZOLE 500 MG PO TABS: 500.0000 mg | ORAL_TABLET | Freq: Two times a day (BID) | ORAL | 0 refills | Status: DC

## 2022-04-01 ENCOUNTER — Telehealth: Payer: Self-pay | Admitting: Emergency Medicine

## 2022-04-01 NOTE — Telephone Encounter (Signed)
Attempted RC to patient who left vm on nurse line. LVM to RC.

## 2022-04-02 ENCOUNTER — Other Ambulatory Visit: Payer: Self-pay | Admitting: Emergency Medicine

## 2022-04-02 MED ORDER — FLUCONAZOLE 150 MG PO TABS: 150.0000 mg | ORAL_TABLET | Freq: Once | ORAL | 2 refills | Status: AC

## 2022-04-02 NOTE — Progress Notes (Signed)
Diflucan for yeast symptoms, per Dr. Jodi Mourning, MD.

## 2022-04-20 ENCOUNTER — Other Ambulatory Visit: Payer: Self-pay

## 2022-04-20 ENCOUNTER — Telehealth: Payer: Self-pay | Admitting: Gastroenterology

## 2022-04-20 MED ORDER — LINACLOTIDE 290 MCG PO CAPS: 290.0000 ug | ORAL_CAPSULE | Freq: Every day | ORAL | 0 refills | Status: DC

## 2022-04-20 NOTE — Telephone Encounter (Signed)
Inbound call about Linzess refill. Advised she would have to schedule OV to get RX. Wants to know if she can get a partial refill because she hasn't had a BM since Saturday. Please advise.

## 2022-04-20 NOTE — Progress Notes (Signed)
30 day supply sent to pharmacy.  Patient must keep her January appointment for further refills.

## 2022-05-19 ENCOUNTER — Ambulatory Visit (INDEPENDENT_AMBULATORY_CARE_PROVIDER_SITE_OTHER): Payer: Medicaid Other

## 2022-05-19 ENCOUNTER — Other Ambulatory Visit (HOSPITAL_COMMUNITY)
Admission: RE | Admit: 2022-05-19 | Discharge: 2022-05-19 | Disposition: A | Discharge status: Home / Self Care | Payer: Medicaid Other | Source: Ambulatory Visit | Attending: Obstetrics & Gynecology | Admitting: Obstetrics & Gynecology

## 2022-05-19 ENCOUNTER — Ambulatory Visit (INDEPENDENT_AMBULATORY_CARE_PROVIDER_SITE_OTHER): Payer: Self-pay

## 2022-05-19 DIAGNOSIS — N898 Other specified noninflammatory disorders of vagina: Secondary | ICD-10-CM

## 2022-05-19 DIAGNOSIS — Z95810 Presence of automatic (implantable) cardiac defibrillator: Secondary | ICD-10-CM

## 2022-05-19 LAB — CUP PACEART REMOTE DEVICE CHECK
Battery Remaining Longevity: 57 mo
Battery Voltage: 2.99 V
Brady Statistic RV Percent Paced: 0.01 %
Date Time Interrogation Session: 20240102001803
HighPow Impedance: 49 Ohm
HighPow Impedance: 77 Ohm
Implantable Lead Connection Status: 753985
Implantable Lead Implant Date: 20080828
Implantable Lead Location: 753860
Implantable Lead Model: 6947
Implantable Pulse Generator Implant Date: 20161117
Lead Channel Impedance Value: 323 Ohm
Lead Channel Impedance Value: 399 Ohm
Lead Channel Pacing Threshold Amplitude: 1.375 V
Lead Channel Pacing Threshold Pulse Width: 0.4 ms
Lead Channel Sensing Intrinsic Amplitude: 3.125 mV
Lead Channel Sensing Intrinsic Amplitude: 3.125 mV
Lead Channel Setting Pacing Amplitude: 3 V
Lead Channel Setting Pacing Pulse Width: 0.4 ms
Lead Channel Setting Sensing Sensitivity: 0.3 mV
Zone Setting Status: 755011

## 2022-05-19 NOTE — Progress Notes (Signed)
..  SUBJECTIVE:  52 y.o. female complains of vaginal itching for a few week(s). Pt reports that this has been an ongoing issue for her. Denies abnormal vaginal bleeding or significant pelvic pain or fever. No UTI symptoms. Denies history of known exposure to STD.  Patient's last menstrual period was 07/23/2014.  OBJECTIVE:  She appears well, afebrile. Urine dipstick: not done.  ASSESSMENT:  Vaginal itching   PLAN:  GC, chlamydia, trichomonas, BVAG, CVAG probe sent to lab. Treatment: To be determined once lab results are received ROV prn if symptoms persist or worsen. Advised pt to schedule appt with provider if symptoms are not soon resolved, pt agreed.

## 2022-05-20 LAB — CERVICOVAGINAL ANCILLARY ONLY
Bacterial Vaginitis (gardnerella): NEGATIVE
Candida Glabrata: NEGATIVE
Candida Vaginitis: NEGATIVE
Chlamydia: NEGATIVE
Comment: NEGATIVE
Comment: NEGATIVE
Comment: NEGATIVE
Comment: NEGATIVE
Comment: NEGATIVE
Comment: NORMAL
Neisseria Gonorrhea: NEGATIVE
Trichomonas: NEGATIVE

## 2022-05-25 ENCOUNTER — Ambulatory Visit: Payer: Self-pay | Admitting: Gastroenterology

## 2022-05-25 ENCOUNTER — Encounter: Payer: Self-pay | Admitting: Gastroenterology

## 2022-05-25 VITALS — BP 140/72 | HR 76 | Ht 66.0 in | Wt 178.4 lb

## 2022-05-25 DIAGNOSIS — R1310 Dysphagia, unspecified: Secondary | ICD-10-CM

## 2022-05-25 DIAGNOSIS — K5900 Constipation, unspecified: Secondary | ICD-10-CM

## 2022-05-25 DIAGNOSIS — R112 Nausea with vomiting, unspecified: Secondary | ICD-10-CM

## 2022-05-25 MED ORDER — LINACLOTIDE 290 MCG PO CAPS: 290.0000 ug | ORAL_CAPSULE | Freq: Every day | ORAL | 11 refills | Status: DC

## 2022-05-25 NOTE — Progress Notes (Signed)
05/25/2022 [name] [id number] 1970-08-12   HISTORY OF PRESENT ILLNESS: This is a 52 year old female who is a patient of Dr. Doyne Keel with past medical history significant for hepatic and pulmonary sarcoidosis, prolonged QT syndrome status post ICD, SVT status post ablation, history of CVA, seizures, DM, IBS with constipation, and fibromyalgia.  She is here today with complaints of nausea and vomiting as well as dysphagia.  She says that she has not been able to keep much food down.  Has intermittent issues with nausea and vomiting.  Says that she gets bloated and very full.  Also been having issues with solid food getting stuck when swallowing as well.  Tells me that she has a lot of nausea regularly.  Says that the other day she took 2 bites of a plain bagel and was throwing up.  Says that if she does not throw up and she is extremely nauseous for hours.  In regards to her constipation she has been using the Linzess 290 mcg daily and feels that that has been helpful to her.  She would like to continue that and needs refills on the medication.  Has chronic LLQ abdominal pain.  CT scan of the abdomen and pelvis without contrast in September 2022 for the same complaint showed no cause of her pain.   Past Medical History:  Diagnosis Date   Abdominal pain, periumbilic    Allergic rhinitis    Allergy    Anemia    Anxiety    Arthritis    Asthma    Automatic implantable cardioverter-defibrillator in situ 2006, replaced 2008   MEDTRONIC   Bronchitis    Cancer ([place])    Nodules in lungs pt believes were cancerous pt unsure   COPD (chronic obstructive pulmonary disease) ([place])    DM (diabetes mellitus) ([place]) 1997/1998   type 1, initially gestational but soon after child birth developed IDDM   Esophageal stricture 2005/2009   esophageal strictures dilated 2005, 2009   Eye hemorrhage    bilateral   Fibroid, uterine    Fibromyalgia    GERD (gastroesophageal reflux disease)  2005   History of blood transfusion years ago   Hypertension    Internal hemorrhoids 2010   2011 band ligation of int rrhoids.   Long Q-T syndrome    Lupus ([place])    questionable   Neuropathy in diabetes [facility])    Palpitation    Sarcoidosis    Seizure disorder ([place])    mid June 2022 last seizure pt reported   Stroke [facility])    QUESTIONABLE TIA .  Jun 29, 2019   Past Surgical History:  Procedure Laterality Date   AUTOMATIC IMPLANTABLE CARDIAC DEFIBRILLATOR SITU  2006   ICD-Medtronic   Remote - Yes x 3   CERVIX REMOVAL     DILITATION & CURRETTAGE/HYSTROSCOPY WITH HYDROTHERMAL ABLATION N/A 06/07/2013   Procedure: DILATATION & CURETTAGE/HYSTEROSCOPY WITH attempted HYDROTHERMAL ABLATION;  Surgeon: Frederico Hamman, MD;  Location: [place] [facility];  Service: Gynecology;  Laterality: N/A;   EP IMPLANTABLE DEVICE N/A 04/04/2015   Procedure:  ICD Generator Changeout;  Surgeon: Evans Lance, MD;  Location: [place] [facility];  Service: Cardiovascular;  Laterality: N/A;   ESOPHAGOGASTRODUODENOSCOPY (EGD) WITH PROPOFOL N/A 10/24/2014   Procedure: ESOPHAGOGASTRODUODENOSCOPY (EGD) WITH PROPOFOL;  Surgeon: Inda Castle, MD;  [facility];  Service: Endoscopy;  Laterality: N/A;   EXPLORATORY LAPAROTOMY  11/06/2014   evacuation of post TAH/BSO hematoma   HAND SURGERY Right  x 2   HEMORRHOID BANDING  03/2010   SALPINGECTOMY     SVT ablation     TOTAL ABDOMINAL HYSTERECTOMY W/ BILATERAL SALPINGOOPHORECTOMY  11/06/2014   at [facility]. for menorrhagia, pelvic pain and enlarging uterine fibroids   TUBAL LIGATION     VIDEO BRONCHOSCOPY Bilateral 01/24/2015   Procedure: VIDEO BRONCHOSCOPY WITHOUT FLUORO;  Surgeon: Marshell Garfinkel, MD;  Location: [place];  Service: Cardiopulmonary;  Laterality: Bilateral;    reports that she has never smoked. She has never used smokeless tobacco. She reports current alcohol use. She reports that she does not use drugs. family history includes Breast cancer in her  maternal aunt; Colon cancer in her maternal grandfather; Diabetes in her father; Esophageal cancer in her paternal aunt; Glaucoma in her father; Heart disease in her mother; Lung cancer in her maternal uncle; Positive PPD/TB Exposure in her father; Stomach cancer in her paternal grandfather, paternal grandmother, and paternal uncle. Allergies  Allergen Reactions   Contrast Media [Iodinated Contrast Media] Anaphylaxis and Shortness Of Breath    Needed to be defibrillated and patient was pre-medicated with radiology 13 hour premeds.    Iohexol Anaphylaxis     Code: RASH, Desc: PT STATES SHE IS ALLERGIC TO IV CONTRAST 05/28/06/RM, Onset Date: [id number]    Augmentin [Amoxicillin-Pot Clavulanate] Other (See Comments)    "stomach hurt"   Codeine Nausea And Vomiting    jittery   Demerol [Meperidine] Other (See Comments)    dysphoria   Morphine And Related Nausea And Vomiting   Lantus [Insulin Glargine] Other (See Comments)    Patient reports itching      Outpatient Encounter Medications as of 05/25/2022  Medication Sig   Accu-Chek Softclix Lancets lancets Use as instructed   acetaminophen (TYLENOL 8 HOUR) 650 MG CR tablet Take 1 tablet (650 mg total) by mouth every 8 (eight) hours as needed for pain.   acetaminophen (TYLENOL) 500 MG tablet Take 1,000 mg by mouth every 6 (six) hours as needed for mild pain or fever. Reported on 07/22/2015   aspirin EC 81 MG tablet Take 1 tablet (81 mg total) by mouth daily.   atorvastatin (LIPITOR) 10 MG tablet Take 1 tablet (10 mg total) by mouth daily.   azelastine (ASTELIN) 0.1 % nasal spray Place 2 sprays into both nostrils 2 (two) times daily. Use in each nostril as directed   Camphor-Menthol-Methyl Sal (SALONPAS) 3.05-23-08 % PTCH Apply 1 patch topically daily as needed (pain).   Continuous Blood Gluc Receiver (FREESTYLE LIBRE 2 READER) DEVI 1 application  by Does not apply route as directed.   Continuous Blood Gluc Sensor (FREESTYLE LIBRE 2 SENSOR) MISC Inject 1  Device into the skin every 14 (fourteen) days.   cycloSPORINE (RESTASIS) 0.05 % ophthalmic emulsion Place 1 drop into both eyes daily.   fluticasone (FLONASE) 50 MCG/ACT nasal spray Place 2 sprays into both nostrils daily as needed for allergies.    fluticasone (FLOVENT HFA) 110 MCG/ACT inhaler Inhale 2 puffs into the lungs 2 (two) times daily.   gabapentin (NEURONTIN) 300 MG capsule Take 1 capsule (300 mg total) by mouth 3 (three) times daily.   glucose blood (ACCU-CHEK GUIDE) test strip Check sugars four times daily   insulin degludec (TRESIBA FLEXTOUCH) 200 UNIT/ML FlexTouch Pen Inject 30 Units into the skin in the morning and at bedtime.   ketotifen (ZADITOR) 0.025 % ophthalmic solution 1 drop 2 (two) times daily.   lamoTRIgine (LAMICTAL) 150 MG tablet Take 1 tablet (150 mg total) by mouth  2 (two) times daily.   Lancet Devices (ACCU-CHEK SOFTCLIX) lancets Use as instructed   levETIRAcetam (KEPPRA) 500 MG tablet Take 1 tablet (500 mg total) by mouth 2 (two) times daily.   linaclotide (LINZESS) 290 MCG CAPS capsule Take 1 capsule (290 mcg total) by mouth daily before breakfast. Please keep your January appointment for further refills. Thank you   metFORMIN (GLUCOPHAGE-XR) 500 MG 24 hr tablet Take 2 tablets (1,000 mg total) by mouth 2 (two) times daily.   NOVOLOG FLEXPEN 100 UNIT/ML FlexPen Inject 15 Units into the skin with breakfast, with lunch, and with evening meal.   pantoprazole (PROTONIX) 40 MG tablet Take 1 tablet (40 mg total) by mouth daily.   Prenat-Fe Poly-Methfol-FA-DHA (VITAFOL ULTRA) 29-0.6-0.4-200 MG CAPS Take 1 capsule by mouth daily before breakfast.   propranolol (INDERAL) 80 MG tablet Take 1 tablet (80 mg total) by mouth 2 (two) times daily.   [DISCONTINUED] Boric Acid Vaginal (AZO BORIC ACID) 600 MG SUPP Place 1 suppository vaginally at bedtime.   [DISCONTINUED] Insulin Pen Needle 31G X 8 MM MISC BD UltraFine III Pen Needles. For use with insulin pen device. Inject insulin 6 x  daily   [DISCONTINUED] metFORMIN (GLUCOPHAGE-XR) 750 MG 24 hr tablet Take 1 tablet (750 mg total) by mouth 2 (two) times daily.   [DISCONTINUED] metroNIDAZOLE (FLAGYL) 500 MG tablet Take 1 tablet (500 mg total) by mouth 2 (two) times daily.   No facility-administered encounter medications on file as of 05/25/2022.     REVIEW OF SYSTEMS  : All other systems reviewed and negative except where noted in the History of Present Illness.   PHYSICAL EXAM: BP (!) 140/72   Pulse 76   Ht '5\' 6"'$  (1.676 m)   Wt 178 lb 6.4 oz (80.9 kg)   LMP 07/23/2014   SpO2 97%   BMI 28.79 kg/m  General: Well developed female in no acute distress Head: Normocephalic and atraumatic Eyes:  Sclerae anicteric, conjunctiva pink. Ears: Normal auditory acuity Lungs: Clear throughout to auscultation; no W/R/R. Heart: Regular rate and rhythm; no M/R/G. Abdomen: Soft, non-distended.  BS present.  Some LLQ TTP. Musculoskeletal: Symmetrical with no gross deformities  Skin: No lesions on visible extremities Extremities: No edema  Neurological: Alert oriented x 4, grossly non-focal Psychological:  Alert and cooperative. Normal mood and affect  ASSESSMENT AND PLAN: *Chronic constipation: On Linzess 290 mcg daily.  Will continue.  Prescription sent to pharmacy. *Left lower quadrant abdominal pain: This is chronic.  CT scan without source identified.  Question due to scar tissue/adhesions. *Dysphagia and nausea/vomiting: Very likely has diabetic related gastroparesis.  Will plan for gastric emptying scan, but with complaints of dysphagia we will also plan for EGD with possible dilation with Dr. Havery Moros. The risks, benefits, and alternatives to EGD with dilation were discussed with the patient and she consents to proceed.    CC:  Zola Button, MD

## 2022-05-25 NOTE — Patient Instructions (Signed)
You have been scheduled for a gastric emptying scan at [facility] on Monday 06/01/22 at 7:30 am. Please arrive at least 30 minutes prior to your appointment for registration. Please make certain not to have anything to eat or drink after midnight the night before your test. Hold all stomach medications (ex: Zofran, phenergan, Reglan) 24 hours prior to your test. If you need to reschedule your appointment, please contact radiology scheduling at [phone]. _____________________________________________________________________ A gastric-emptying study measures how long it takes for food to move through your stomach. There are several ways to measure stomach emptying. In the most common test, you eat food that contains a small amount of radioactive material. A scanner that detects the movement of the radioactive material is placed over your abdomen to monitor the rate at which food leaves your stomach. This test normally takes about 4 hours to complete. _____________________________________________________________________  [name] have been scheduled for an endoscopy. Please follow written instructions given to you at your visit today. If you use inhalers (even only as needed), please bring them with you on the day of your procedure.  _______________________________________________________  If you are age 3 or older, your body mass index should be between 23-30. Your Body mass index is 28.79 kg/m. If this is out of the aforementioned range listed, please consider follow up with your Primary Care Provider.  If you are age 61 or younger, your body mass index should be between 19-25. Your Body mass index is 28.79 kg/m. If this is out of the aformentioned range listed, please consider follow up with your Primary Care Provider.   ________________________________________________________  The [facility] providers would like to encourage you to use [facility] to communicate with providers for non-urgent  requests or questions.  Due to long hold times on the telephone, sending your provider a message by [facility] may be a faster and more efficient way to get a response.  Please allow 48 business hours for a response.  Please remember that this is for non-urgent requests.  _______________________________________________________

## 2022-05-26 DIAGNOSIS — R112 Nausea with vomiting, unspecified: Secondary | ICD-10-CM | POA: Insufficient documentation

## 2022-05-26 DIAGNOSIS — R1310 Dysphagia, unspecified: Secondary | ICD-10-CM | POA: Insufficient documentation

## 2022-06-01 ENCOUNTER — Encounter (HOSPITAL_COMMUNITY): Admission: RE | Admit: 2022-06-01 | Payer: Self-pay | Source: Ambulatory Visit

## 2022-06-02 ENCOUNTER — Encounter: Payer: Self-pay | Admitting: Gastroenterology

## 2022-06-02 NOTE — Progress Notes (Signed)
Agree with assessment and plan as outlined.  

## 2022-06-15 ENCOUNTER — Encounter: Payer: Self-pay | Admitting: *Deleted

## 2022-06-18 NOTE — Progress Notes (Signed)
Remote ICD transmission.   

## 2022-06-22 ENCOUNTER — Encounter: Payer: Self-pay | Admitting: Gastroenterology

## 2022-07-01 ENCOUNTER — Other Ambulatory Visit: Payer: Self-pay

## 2022-07-01 DIAGNOSIS — E119 Type 2 diabetes mellitus without complications: Secondary | ICD-10-CM

## 2022-07-01 MED ORDER — NOVOLOG FLEXPEN 100 UNIT/ML ~~LOC~~ SOPN: 15.0000 [IU] | PEN_INJECTOR | Freq: Three times a day (TID) | SUBCUTANEOUS | 3 refills | Status: DC

## 2022-07-03 ENCOUNTER — Telehealth: Payer: Self-pay

## 2022-07-03 MED ORDER — INSULIN PEN NEEDLE 30G X 8 MM MISC: 1.0000 | 5 refills | Status: AC | PRN

## 2022-07-03 NOTE — Telephone Encounter (Signed)
Patient calls nurse line requesting refill on pen needles.   She will need them for [place] and [employer].   Please send prescription to [facility] on [address].   Talbot Grumbling, RN

## 2022-07-16 ENCOUNTER — Other Ambulatory Visit: Payer: Self-pay | Admitting: Pharmacist

## 2022-07-30 ENCOUNTER — Telehealth: Payer: Self-pay

## 2022-07-30 ENCOUNTER — Other Ambulatory Visit (HOSPITAL_COMMUNITY)
Admission: RE | Admit: 2022-07-30 | Discharge: 2022-07-30 | Disposition: A | Discharge status: Home / Self Care | Payer: Medicaid Other | Source: Ambulatory Visit | Attending: Obstetrics & Gynecology | Admitting: Obstetrics & Gynecology

## 2022-07-30 ENCOUNTER — Ambulatory Visit (INDEPENDENT_AMBULATORY_CARE_PROVIDER_SITE_OTHER): Payer: Medicaid Other | Admitting: Emergency Medicine

## 2022-07-30 VITALS — BP 168/89 | HR 69 | Ht 67.0 in | Wt 180.2 lb

## 2022-07-30 DIAGNOSIS — N898 Other specified noninflammatory disorders of vagina: Secondary | ICD-10-CM | POA: Diagnosis not present

## 2022-07-30 MED ORDER — FLUCONAZOLE 150 MG PO TABS: 150.0000 mg | ORAL_TABLET | Freq: Once | ORAL | 1 refills | Status: DC

## 2022-07-30 MED ORDER — FLUCONAZOLE 150 MG PO TABS: 150.0000 mg | ORAL_TABLET | Freq: Once | ORAL | 0 refills | Status: DC

## 2022-07-30 NOTE — Telephone Encounter (Signed)
Patient calls nurse line requesting to schedule an apt for blood pressure.   She reports she was seen by her OBGYN this morning and her BP was 168/89. Patient reports she is asymptomatic.  She reports her BP has been "up and down" and she would like to discuss medication options with PCP.   Patient scheduled for 3/18 with PCP to discuss.   Precautions discussed with patient.

## 2022-07-30 NOTE — Progress Notes (Signed)
SUBJECTIVE:  52 y.o. female complains of vaginal itching and vaginal discharge for 1 week. Denies abnormal vaginal bleeding or significant pelvic pain or fever. No UTI symptoms. Denies history of known exposure to STD.  Patient's last menstrual period was 07/23/2014.  OBJECTIVE:  She appears well, afebrile.  ASSESSMENT:  Vaginal Discharge  Vaginal Odor Elevated BP in office- Pt denies symptoms. Has PCP and cardiologist. Patient states she will f/u with provider today.   PLAN:  GC, chlamydia, trichomonas, BVAG, CVAG probe sent to lab. Treatment: To be determined once lab results are received ROV prn if symptoms persist or worsen.

## 2022-07-30 NOTE — Addendum Note (Signed)
Addended by: Barkley Boards on: 07/30/2022 04:08 PM   Modules accepted: Orders

## 2022-07-31 LAB — CERVICOVAGINAL ANCILLARY ONLY
Bacterial Vaginitis (gardnerella): NEGATIVE
Candida Glabrata: NEGATIVE
Candida Vaginitis: NEGATIVE
Chlamydia: NEGATIVE
Comment: NEGATIVE
Comment: NEGATIVE
Comment: NEGATIVE
Comment: NEGATIVE
Comment: NEGATIVE
Comment: NORMAL
Neisseria Gonorrhea: NEGATIVE
Trichomonas: NEGATIVE

## 2022-08-03 ENCOUNTER — Ambulatory Visit: Payer: Medicaid Other | Admitting: Family Medicine

## 2022-08-03 ENCOUNTER — Encounter: Payer: Self-pay | Admitting: Family Medicine

## 2022-08-03 VITALS — BP 158/95 | HR 69 | Ht 67.0 in | Wt 183.4 lb

## 2022-08-03 DIAGNOSIS — I152 Hypertension secondary to endocrine disorders: Secondary | ICD-10-CM | POA: Diagnosis not present

## 2022-08-03 DIAGNOSIS — E1165 Type 2 diabetes mellitus with hyperglycemia: Secondary | ICD-10-CM | POA: Diagnosis not present

## 2022-08-03 DIAGNOSIS — Z794 Long term (current) use of insulin: Secondary | ICD-10-CM

## 2022-08-03 DIAGNOSIS — E1159 Type 2 diabetes mellitus with other circulatory complications: Secondary | ICD-10-CM | POA: Diagnosis not present

## 2022-08-03 DIAGNOSIS — E010 Iodine-deficiency related diffuse (endemic) goiter: Secondary | ICD-10-CM | POA: Diagnosis not present

## 2022-08-03 MED ORDER — VALSARTAN 80 MG PO TABS: 80.0000 mg | ORAL_TABLET | Freq: Every day | ORAL | 3 refills | Status: AC

## 2022-08-03 MED ORDER — CARETOUCH BP ARM MONITOR DEVI: 0 refills | Status: AC

## 2022-08-03 NOTE — Assessment & Plan Note (Addendum)
Previously uncontrolled.  Fasting sugars appear to be reasonably controlled, will obtain A1c today.

## 2022-08-03 NOTE — Progress Notes (Signed)
SUBJECTIVE:   CHIEF COMPLAINT / HPI:  Chief Complaint  Patient presents with   Hypertension    Patient reports elevated blood pressures at her OB/GYN office recently.  Reading was 168/89. Does not have a BP cuff at home.  Reports that her sugars have been better lately. Fasting CBGs 110s-180s. Fast acting insulin on sliding scale, BIDAC Tresiba 30 units twice daily Denies symptomatic hypoglycemia  Patient also reports an area of swelling on the right side of her neck and some discomfort that has occurred over the past 4 months.  PERTINENT  PMH / PSH: T2DM, HTN, seizure disorder, COPD, sarcoidosis, Long QT syndrome s/p ICD implantation 2016  Patient Care Team: Zola Button, MD as PCP - General (Family Medicine) Evans Lance, MD as PCP - Cardiology (Cardiology) Evans Lance, MD as PCP - Electrophysiology (Cardiology) Cameron Sprang, MD as Consulting Physician (Neurology)   OBJECTIVE:   BP (!) 158/95   Pulse 69   Ht 5\' 7"  (1.702 m)   Wt 183 lb 6.4 oz (83.2 kg)   LMP 07/23/2014   SpO2 100%   BMI 28.72 kg/m   Physical Exam Constitutional:      General: She is not in acute distress. Neck:     Comments: Questionable right thyromegaly on exam Cardiovascular:     Rate and Rhythm: Normal rate and regular rhythm.  Pulmonary:     Effort: Pulmonary effort is normal. No respiratory distress.     Breath sounds: Normal breath sounds.  Musculoskeletal:     Cervical back: Neck supple.  Neurological:     Mental Status: She is alert.         08/03/2022   11:06 AM  Depression screen PHQ 2/9  Decreased Interest 0  Down, Depressed, Hopeless 0  PHQ - 2 Score 0  Altered sleeping 1  Tired, decreased energy 1  Change in appetite 1  Feeling bad or failure about yourself  0  Trouble concentrating 0  Moving slowly or fidgety/restless 0  Suicidal thoughts 0  PHQ-9 Score 3  Difficult doing work/chores Not difficult at all     Wt Readings from Last 3 Encounters:   08/03/22 183 lb 6.4 oz (83.2 kg)  07/30/22 180 lb 3.2 oz (81.7 kg)  05/25/22 178 lb 6.4 oz (80.9 kg)        ASSESSMENT/PLAN:   Problem List Items Addressed This Visit       Cardiovascular and Mediastinum   Hypertension associated with diabetes ([place])    Now uncontrolled, will start ARB given history of diabetes. - needs BMP in 1-2 weeks, will message cardiology office about obtaining this in their office - advised to monitor BP at home, will send in BP cuff      Relevant Medications   valsartan (DIOVAN) 80 MG tablet   Blood Pressure Monitoring (CARETOUCH BP ARM MONITOR) DEVI     Endocrine   Diabetes mellitus, type 2 ([place]) - Primary    Previously uncontrolled.  Fasting sugars appear to be reasonably controlled, will obtain A1c today.      Relevant Medications   valsartan (DIOVAN) 80 MG tablet   Other Relevant Orders   Hemoglobin A1c   Thyromegaly    New development of right-sided neck swelling/thyromegaly.  Will evaluate further with labs and ultrasound.      Relevant Orders   TSH Rfx on Abnormal to Free T4   US THYROID      Return in about 4 weeks (around 08/31/2022)  for f/u HTN.   Zola Button, MD [place]

## 2022-08-03 NOTE — Patient Instructions (Addendum)
It was nice seeing you today!  Get your ultrasound as scheduled.  Start valsartan once a day for your blood pressure. Try to get a blood pressure if you can and check at least a few times a week.  Stay well, Zola Button, MD [place] [phone]  --  Make sure to check out at the front desk before you leave today.  Please arrive at least 15 minutes prior to your scheduled appointments.  If you had blood work today, I will send you a MyChart message or a letter if results are normal. Otherwise, I will give you a call.  If you had a referral placed, they will call you to set up an appointment. Please give [place] a call if you don't hear back in the next 2 weeks.  If you need additional refills before your next appointment, please call your pharmacy first.

## 2022-08-03 NOTE — Assessment & Plan Note (Signed)
New development of right-sided neck swelling/thyromegaly.  Will evaluate further with labs and ultrasound.

## 2022-08-03 NOTE — Assessment & Plan Note (Addendum)
Now uncontrolled, will start ARB given history of diabetes. - needs BMP in 1-2 weeks, will message cardiology office about obtaining this in their office - advised to monitor BP at home, will send in BP cuff

## 2022-08-04 LAB — HEMOGLOBIN A1C
Est. average glucose Bld gHb Est-mCnc: 226 mg/dL
Hgb A1c MFr Bld: 9.5 % — ABNORMAL HIGH (ref 4.8–5.6)

## 2022-08-04 LAB — TSH RFX ON ABNORMAL TO FREE T4: TSH: 0.978 u[IU]/mL (ref 0.450–4.500)

## 2022-08-05 ENCOUNTER — Telehealth: Payer: Self-pay

## 2022-08-05 ENCOUNTER — Telehealth: Payer: Self-pay | Admitting: Internal Medicine

## 2022-08-05 NOTE — Telephone Encounter (Signed)
Patient stated her PCP placed her on new medication Valsartan (1 tablet 80 mg) and wanted to make sure that this didn't interfere with any of the medication that she is currently taking.  Best contact number is [phone]

## 2022-08-05 NOTE — Telephone Encounter (Signed)
-----   Message from Leeroy Bock, [place] sent at 08/05/2022 12:45 PM EDT ----- Regarding: RE: Pt had question about new med vs what she is currently taking. No interactions between valsartan and her other meds, this is just fine for her to take. ----- Message ----- From: Varney Daily, RN Sent: 08/05/2022   9:02 AM EDT To: Cv Div Pharmd Subject: Pt had question about new med vs what she is#  Good morning Pharmacy team,  This patient reached out to [place] in [facility] triage.  She was put on Valsartan (1 tablet 80 mg) and wanted to make sure that this didn't interfere with any of the medication that she is currently taking.  I appreciate you looking at this for [place], and I can call her back with an answer.  Thank you in advance for your help.  Merrilee Seashore

## 2022-08-05 NOTE — Telephone Encounter (Signed)
Message received in [facility] Triage.    Pt wanted to know if Valsartan interfere's with any meds on her current med list?  Message sent to Pharm-D

## 2022-08-06 ENCOUNTER — Ambulatory Visit (HOSPITAL_COMMUNITY)
Admission: RE | Admit: 2022-08-06 | Discharge: 2022-08-06 | Disposition: A | Discharge status: Home / Self Care | Payer: Medicaid Other | Source: Ambulatory Visit | Attending: Family Medicine | Admitting: Family Medicine

## 2022-08-06 DIAGNOSIS — E010 Iodine-deficiency related diffuse (endemic) goiter: Secondary | ICD-10-CM | POA: Insufficient documentation

## 2022-08-06 DIAGNOSIS — E049 Nontoxic goiter, unspecified: Secondary | ICD-10-CM | POA: Diagnosis not present

## 2022-08-11 ENCOUNTER — Telehealth: Payer: Self-pay | Admitting: Internal Medicine

## 2022-08-11 NOTE — Telephone Encounter (Signed)
Pt c/o medication issue:  1. Name of Medication: valsartan (DIOVAN) 80 MG tablet   2. How are you currently taking this medication (dosage and times per day)?    3. Are you having a reaction (difficulty breathing--STAT)? no  4. What is your medication issue? Calling to see if it okay for her to be taking this medication. Please advise

## 2022-08-12 NOTE — Telephone Encounter (Signed)
Pt called back per message received from [facility] Triage.    Pt was sent a MyChart message regarding Jinny Blossom, Pharmacist, stating it was ok to take Valsartan with current medications.    Pt called and educated on the medication, to watch her BP, and what Ms. Jinny Blossom had to say.  Pt understood education.  Pt stated she sometimes feels random dizziness and nausea, but it is not long lasting. Pt advised to watch her symptoms, and if when she starts Valsartan, these symptoms worsen or become more frequent, that she should contact her PCP or provider, and stop taking this new medicine.  Pt understood directions, and appreciated the feed back.  Pt told to reach out to [facility] anytime with questions.

## 2022-08-13 ENCOUNTER — Encounter: Payer: Self-pay | Admitting: Internal Medicine

## 2022-08-13 ENCOUNTER — Ambulatory Visit: Payer: Medicaid Other | Attending: Internal Medicine | Admitting: Internal Medicine

## 2022-08-13 VITALS — BP 148/88 | HR 70 | Ht 67.0 in | Wt 180.6 lb

## 2022-08-13 DIAGNOSIS — D869 Sarcoidosis, unspecified: Secondary | ICD-10-CM

## 2022-08-13 DIAGNOSIS — I4581 Long QT syndrome: Secondary | ICD-10-CM

## 2022-08-13 DIAGNOSIS — Z95810 Presence of automatic (implantable) cardiac defibrillator: Secondary | ICD-10-CM

## 2022-08-13 LAB — CUP PACEART INCLINIC DEVICE CHECK
Battery Remaining Longevity: 54 mo
Battery Voltage: 2.98 V
Brady Statistic RV Percent Paced: 0.01 %
Date Time Interrogation Session: 20240328200359
HighPow Impedance: 52 Ohm
HighPow Impedance: 81 Ohm
Implantable Lead Connection Status: 753985
Implantable Lead Implant Date: 20080828
Implantable Lead Location: 753860
Implantable Lead Model: 6947
Implantable Pulse Generator Implant Date: 20161117
Lead Channel Impedance Value: 323 Ohm
Lead Channel Impedance Value: 399 Ohm
Lead Channel Pacing Threshold Amplitude: 1.375 V
Lead Channel Pacing Threshold Pulse Width: 0.4 ms
Lead Channel Sensing Intrinsic Amplitude: 2.25 mV
Lead Channel Sensing Intrinsic Amplitude: 3.875 mV
Lead Channel Setting Pacing Amplitude: 3 V
Lead Channel Setting Pacing Pulse Width: 0.4 ms
Lead Channel Setting Sensing Sensitivity: 0.3 mV
Zone Setting Status: 755011

## 2022-08-13 NOTE — Progress Notes (Signed)
HPI [name] returns today for followup. 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 She has sarcoidosis. She lost over 30 lbs a couple of years ago but has gained the weight back. She denies chest pain or sob. No cough or other symptoms of sarcoid. The patient has some difficulty walking to her remotely injured knee.  Allergies  Allergen Reactions   Contrast Media [Iodinated Contrast Media] Anaphylaxis and Shortness Of Breath    Needed to be defibrillated and patient was pre-medicated with radiology 13 hour premeds.    Iohexol Anaphylaxis     Code: RASH, Desc: PT STATES SHE IS ALLERGIC TO IV CONTRAST 05/28/06/RM, Onset Date: [id number]    Augmentin [Amoxicillin-Pot Clavulanate] Other (See Comments)    "stomach hurt"   Codeine Nausea And Vomiting    jittery   Demerol [Meperidine] Other (See Comments)    dysphoria   Morphine And Related Nausea And Vomiting   Lantus [Insulin Glargine] Other (See Comments)    Patient reports itching     Current Outpatient Medications  Medication Sig Dispense Refill   Accu-Chek Softclix Lancets lancets Use as instructed 100 each 12   acetaminophen (TYLENOL 8 HOUR) 650 MG CR tablet Take 1 tablet (650 mg total) by mouth every 8 (eight) hours as needed for pain.     acetaminophen (TYLENOL) 500 MG tablet Take 1,000 mg by mouth every 6 (six) hours as needed for mild pain or fever. Reported on 07/22/2015     aspirin EC 81 MG tablet Take 1 tablet (81 mg total) by mouth daily. 30 tablet 11   atorvastatin (LIPITOR) 10 MG tablet Take 1 tablet (10 mg total) by mouth daily. 30 tablet 2   azelastine (ASTELIN) 0.1 % nasal spray Place 2 sprays into both nostrils 2 (two) times daily. Use in each nostril as directed 30 mL 12   Blood Pressure Monitoring (CARETOUCH BP ARM MONITOR) DEVI Use as needed to check blood pressure. 1 each 0   Camphor-Menthol-Methyl Sal (SALONPAS) 3.05-23-08 % PTCH Apply 1 patch topically daily as needed (pain).      cycloSPORINE (RESTASIS) 0.05 % ophthalmic emulsion Place 1 drop into both eyes daily.     fluticasone (FLONASE) 50 MCG/ACT nasal spray Place 2 sprays into both nostrils daily as needed for allergies.      fluticasone (FLOVENT HFA) 110 MCG/ACT inhaler Inhale 2 puffs into the lungs 2 (two) times daily. 1 each 6   glucose blood (ACCU-CHEK GUIDE) test strip Check sugars four times daily 100 each 12   insulin degludec (TRESIBA FLEXTOUCH) 200 UNIT/ML FlexTouch Pen Inject 30 Units into the skin in the morning and at bedtime. 15 mL 3   Insulin Pen Needle (B-D ULTRAFINE III SHORT PEN) 31G X 8 MM MISC USE 6 TIMES DAILY 200 each 3   Insulin Pen Needle (NOVOFINE) 30G X 8 MM MISC Inject 10 each into the skin as needed. 100 each 5   ketotifen (ZADITOR) 0.025 % ophthalmic solution 1 drop 2 (two) times daily.     lamoTRIgine (LAMICTAL) 150 MG tablet Take 1 tablet (150 mg total) by mouth 2 (two) times daily. 180 tablet 3   Lancet Devices (ACCU-CHEK SOFTCLIX) lancets Use as instructed 1 each 0   linaclotide (LINZESS) 290 MCG CAPS capsule Take 1 capsule (290 mcg total) by mouth daily before breakfast. 30 capsule 11   metFORMIN (GLUCOPHAGE-XR) 500 MG 24 hr tablet Take 2 tablets (1,000  mg total) by mouth 2 (two) times daily. 360 tablet 3   NOVOLOG FLEXPEN 100 UNIT/ML FlexPen Inject 15 Units into the skin with breakfast, with lunch, and with evening meal. 15 mL 3   pantoprazole (PROTONIX) 40 MG tablet Take 1 tablet (40 mg total) by mouth daily. 30 tablet 2   Prenat-Fe Poly-Methfol-FA-DHA (VITAFOL ULTRA) 29-0.6-0.4-200 MG CAPS Take 1 capsule by mouth daily before breakfast. 30 capsule 11   propranolol (INDERAL) 80 MG tablet Take 1 tablet (80 mg total) by mouth 2 (two) times daily. 180 tablet 3   valsartan (DIOVAN) 80 MG tablet Take 1 tablet (80 mg total) by mouth at bedtime. 90 tablet 3   gabapentin (NEURONTIN) 300 MG capsule Take 1 capsule (300 mg total) by mouth 3 (three) times daily. 270 capsule 0   No current  facility-administered medications for this visit.     Past Medical History:  Diagnosis Date   Abdominal pain, periumbilic    Allergic rhinitis    Allergy    Anemia    Anxiety    Arthritis    Asthma    Automatic implantable cardioverter-defibrillator in situ 2006, replaced 2008   MEDTRONIC   Bronchitis    Cancer ([place])    Nodules in lungs pt believes were cancerous pt unsure   COPD (chronic obstructive pulmonary disease) ([place])    DM (diabetes mellitus) ([place]) 1997/1998   type 1, initially gestational but soon after child birth developed IDDM   Esophageal stricture 2005/2009   esophageal strictures dilated 2005, 2009   Eye hemorrhage    bilateral   Fibroid, uterine    Fibromyalgia    GERD (gastroesophageal reflux disease) 2005   History of blood transfusion years ago   Hypertension    Internal hemorrhoids 2010   2011 band ligation of int rrhoids.   Long Q-T syndrome    Lupus ([place])    questionable   Neuropathy in diabetes [facility])    Palpitation    Sarcoidosis    Seizure disorder ([place])    mid June 2022 last seizure pt reported   Stroke [facility])    QUESTIONABLE TIA .  Jun 29, 2019    ROS:   All systems reviewed and negative except as noted in the HPI.   Past Surgical History:  Procedure Laterality Date   AUTOMATIC IMPLANTABLE CARDIAC DEFIBRILLATOR SITU  2006   ICD-Medtronic   Remote - Yes x 3   CERVIX REMOVAL     DILITATION & CURRETTAGE/HYSTROSCOPY WITH HYDROTHERMAL ABLATION N/A 06/07/2013   Procedure: DILATATION & CURETTAGE/HYSTEROSCOPY WITH attempted HYDROTHERMAL ABLATION;  Surgeon: Frederico Hamman, MD;  Location: [facility];  Service: Gynecology;  Laterality: N/A;   EP IMPLANTABLE DEVICE N/A 04/04/2015   Procedure:  ICD Generator Changeout;  Surgeon: Evans Lance, MD;  Location: [facility];  Service: Cardiovascular;  Laterality: N/A;   ESOPHAGOGASTRODUODENOSCOPY (EGD) WITH PROPOFOL N/A 10/24/2014   Procedure: ESOPHAGOGASTRODUODENOSCOPY (EGD) WITH PROPOFOL;   Surgeon: Inda Castle, MD;  Location: [facility];  Service: Endoscopy;  Laterality: N/A;   EXPLORATORY LAPAROTOMY  11/06/2014   evacuation of post TAH/BSO hematoma   HAND SURGERY Right    x 2   HEMORRHOID BANDING  03/2010   SALPINGECTOMY     SVT ablation     TOTAL ABDOMINAL HYSTERECTOMY W/ BILATERAL SALPINGOOPHORECTOMY  11/06/2014   at [facility]. for menorrhagia, pelvic pain and enlarging uterine fibroids   TUBAL LIGATION     VIDEO BRONCHOSCOPY Bilateral 01/24/2015   Procedure: VIDEO BRONCHOSCOPY  WITHOUT FLUORO;  Surgeon: Marshell Garfinkel, MD;  Location: [facility];  Service: Cardiopulmonary;  Laterality: Bilateral;     Family History  Problem Relation Age of Onset   Heart disease Mother    Aneurysm Mother    Diabetes Father    Positive PPD/TB Exposure Father    Glaucoma Father    Colon cancer Maternal Grandfather    Stomach cancer Paternal Grandmother    Stomach cancer Paternal Grandfather    Breast cancer Maternal Aunt        x2   Lung cancer Maternal Uncle        x2   Esophageal cancer Paternal Aunt    Stomach cancer Paternal Uncle      Social History   Socioeconomic History   Marital status: Single    Spouse name: Not on file   Number of children: 3   Years of education: Not on file   Highest education level: Not on file  Occupational History   Occupation: disabled  Tobacco Use   Smoking status: Never   Smokeless tobacco: Never  Vaping Use   Vaping Use: Never used  Substance and Sexual Activity   Alcohol use: Yes    Comment: rarely   Drug use: Never   Sexual activity: Yes    Partners: Male    Birth control/protection: Condom  Other Topics Concern   Not on file  Social History Narrative   Right handed    Social Determinants of Health   Financial Resource Strain: Not on file  Food Insecurity: Not on file  Transportation Needs: Not on file  Physical Activity: Not on file  Stress: Not on file  Social Connections: Not on file  Intimate Partner  Violence: Not on file     BP (!) 148/88   Pulse 70   Ht 5\' 7"  (1.702 m)   Wt 180 lb 9.6 oz (81.9 kg)   LMP 07/23/2014   SpO2 97%   BMI 28.29 kg/m   Physical Exam:  Well appearing NAD HEENT: Unremarkable Neck:  No JVD, no thyromegally Lymphatics:  No adenopathy Back:  No CVA tenderness Lungs:  Clear with no wheezes HEART:  Regular rate rhythm, no murmurs, no rubs, no clicks Abd:  soft, positive bowel sounds, no organomegally, no rebound, no guarding Ext:  2 plus pulses, no edema, no cyanosis, no clubbing Skin:  No rashes no nodules Neuro:  CN II through XII intact, motor grossly intact  EKG - NSR with prolonged QT  DEVICE  Normal device function.  See PaceArt for details.   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 I told her about driving restrictions. 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 ICD - her medtronic device is working normally. She has chronically low R waves with appropriate sensing during the VT episode. Long QT - she will continue the propranolol. Her QT is out a little today. Sarcoidosis - she will continue her current medical therapy. She is pending pulmonary followup.   Carleene Overlie Catelyn Friel,MD

## 2022-08-13 NOTE — Patient Instructions (Signed)
Medication Instructions:  Your physician recommends that you continue on your current medications as directed. Please refer to the Current Medication list given to you today.  *If you need a refill on your cardiac medications before your next appointment, please call your pharmacy*  Lab Work: None ordered.  If you have labs (blood work) drawn today and your tests are completely normal, you will receive your results only by: [place] (if you have MyChart) OR A paper copy in the mail If you have any lab test that is abnormal or we need to change your treatment, we will call you to review the results.  Testing/Procedures: None ordered.  Follow-Up: At [facility], you and your health needs are our priority.  As part of our continuing mission to provide you with exceptional heart care, we have created designated Provider Care Teams.  These Care Teams include your primary Cardiologist (physician) and Advanced Practice Providers (APPs -  Physician Assistants and Nurse Practitioners) who all work together to provide you with the care you need, when you need it.  We recommend signing up for the patient portal called "MyChart".  Sign up information is provided on this After Visit Summary.  MyChart is used to connect with patients for Virtual Visits (Telemedicine).  Patients are able to view lab/test results, encounter notes, upcoming appointments, etc.  Non-urgent messages can be sent to your provider as well.   To learn more about what you can do with MyChart, go to [email or web address].    Your next appointment:   1 year(s)  The format for your next appointment:   In Person  Provider:   Cristopher Peru, MD{or one of the following Advanced Practice Providers on your designated Care Team:   Tommye Standard, [place] Legrand Como "Jonni Sanger" Chalmers Cater, [place]  Remote monitoring is used to monitor your ICD from home. This monitoring reduces the number of office visits required to check your device to one  time per year. It allows [place] to keep an eye on the functioning of your device to ensure it is working properly. You are scheduled for a device check from home on 08/18/2022. You may send your transmission at any time that day. If you have a wireless device, the transmission will be sent automatically. After your physician reviews your transmission, you will receive a postcard with your next transmission date.

## 2022-08-16 ENCOUNTER — Other Ambulatory Visit: Payer: Self-pay | Admitting: Family Medicine

## 2022-08-16 DIAGNOSIS — I152 Hypertension secondary to endocrine disorders: Secondary | ICD-10-CM

## 2022-08-17 ENCOUNTER — Ambulatory Visit (INDEPENDENT_AMBULATORY_CARE_PROVIDER_SITE_OTHER): Payer: Medicaid Other | Admitting: Pharmacist

## 2022-08-17 ENCOUNTER — Encounter: Payer: Self-pay | Admitting: Pharmacist

## 2022-08-17 VITALS — BP 139/82 | HR 65 | Wt 184.2 lb

## 2022-08-17 DIAGNOSIS — I152 Hypertension secondary to endocrine disorders: Secondary | ICD-10-CM | POA: Diagnosis not present

## 2022-08-17 DIAGNOSIS — Z794 Long term (current) use of insulin: Secondary | ICD-10-CM | POA: Diagnosis not present

## 2022-08-17 DIAGNOSIS — E1159 Type 2 diabetes mellitus with other circulatory complications: Secondary | ICD-10-CM | POA: Diagnosis not present

## 2022-08-17 DIAGNOSIS — E1165 Type 2 diabetes mellitus with hyperglycemia: Secondary | ICD-10-CM | POA: Diagnosis not present

## 2022-08-17 DIAGNOSIS — E119 Type 2 diabetes mellitus without complications: Secondary | ICD-10-CM | POA: Diagnosis not present

## 2022-08-17 MED ORDER — FREESTYLE LIBRE 3 READER DEVI: 1.0000 | Freq: Once | 0 refills | Status: AC

## 2022-08-17 MED ORDER — TRESIBA FLEXTOUCH 200 UNIT/ML ~~LOC~~ SOPN: 40.0000 [IU] | PEN_INJECTOR | Freq: Every day | SUBCUTANEOUS | 3 refills | Status: DC

## 2022-08-17 MED ORDER — NOVOLOG FLEXPEN 100 UNIT/ML ~~LOC~~ SOPN: 15.0000 [IU] | PEN_INJECTOR | Freq: Two times a day (BID) | SUBCUTANEOUS | 3 refills | Status: DC

## 2022-08-17 MED ORDER — FREESTYLE LIBRE 3 SENSOR MISC: 11 refills | Status: DC

## 2022-08-17 NOTE — Patient Instructions (Addendum)
It was great seeing you today!  Here are the changes we made to your medications:  CHANGE Tresiba (insulin degludec) to 40 units once daily in the morning.  CHANGE Novolog (insulin aspart) to 15 units 2 times daily with Lunch and [employer]

## 2022-08-17 NOTE — Assessment & Plan Note (Signed)
Diabetes diagnosed in 2008. Patient is able to verbalize appropriate hypoglycemia management plan. Medication adherence appears good. Control is suboptimal due to recent elevated A1c reading of 9.5% on 08/03/22. -Adjusted dose of basal insulin Tresiba (insulin degludec) from 30 units BID to 40 units daily in the morning. -Adjusted dose of rapid insulin Novolog (insulin aspart) from 5 units BID to 15 units BID with Lunch and Dinner meals.  -Continued metformin-XR 2000 mg daily.  -Ordered BMET  -Extensively discussed pathophysiology of diabetes, recommended lifestyle interventions, dietary effects on blood sugar control. Discussed portion sizes and distribution of carbs, proteins, sugars, etc. associated with foods, drinks, and meals. -Counseled on s/sx of and management of hypoglycemia.  -Next A1c anticipated 11/03/22.

## 2022-08-17 NOTE — Progress Notes (Signed)
S:     Chief Complaint  Patient presents with   Medication Management    HTN, T2DM   52 y.o. female who presents for diabetes evaluation, education, and management.  PMH is significant for HTN, T2DM.  Patient was referred and last seen by Primary Care Provider, Dr. Nancy Fetter, on 08/03/22.  At last visit, Valsartan 80 mg daily was initiated to help with HTN control.   Today, patient arrives in good spirits and presents without any assistance.   Patient reports she has had diabetes for years.  Current diabetes medications include: Novolog Flexpen (insulin aspart) 5 units with lunch and dinner meals daily, Tresiba (insulin degludec) 30 units BID, metformin XR 2000 mg daily Current hypertension medications include: Valsartan 80 mg daily Current hyperlipidemia medications include: Atorvastatin 10 mg daily  Patient reports adherence to taking all medications as prescribed.   Do you feel that your medications are working for you? yes Have you been experiencing any side effects to the medications prescribed? no Do you have any problems obtaining medications due to transportation or finances? no Insurance coverage: [place] Medicaid  Patient denies hypoglycemic events.  Reported home BP readings: Patient does not measure BP at home.  Reported home fasting blood sugars: Mid 100s, checks mostly in the mornings. Highest value is 232.  Reported 2 hour post-meal/random blood sugars: After dinner BG readings are 170s .  Patient denies nocturia (nighttime urination).  Patient reports neuropathy (nerve pain). Patient denies visual changes. Patient denies self foot exams.   Patient reported dietary habits: Eats 2 meals/day Breakfast: Bagel, boiled egg Lunch: Tuna, campbell's soup, Progresso soup Dinner: Boiled chicken, vegetables, collard greens, potatoes Drinks: Juice  Within the past 12 months, did you worry whether your food would run out before you got money to buy more? no Within the past  12 months, did the food you bought run out, and you didn't have money to get more? no  O:   Review of Systems  All other systems reviewed and are negative.   Physical Exam Constitutional:      Appearance: Normal appearance.  Pulmonary:     Breath sounds: Normal breath sounds.  Neurological:     Mental Status: She is alert.  Psychiatric:        Mood and Affect: Mood normal.        Behavior: Behavior normal.        Thought Content: Thought content normal.        Judgment: Judgment normal.      Lab Results  Component Value Date   HGBA1C 9.5 (H) 08/03/2022   Vitals:   08/17/22 0953 08/17/22 0954  BP: 136/84 139/82  Pulse: 65   SpO2: 98%     Lipid Panel     Component Value Date/Time   CHOL 177 10/27/2021 1516   TRIG 126 10/27/2021 1516   HDL 42 10/27/2021 1516   CHOLHDL 4.2 10/27/2021 1516   CHOLHDL 8.7 (H) 12/24/2014 1533   VLDL 47 (H) 12/24/2014 1533   LDLCALC 112 (H) 10/27/2021 1516    Clinical Atherosclerotic Cardiovascular Disease (ASCVD): No  The ASCVD Risk score (Arnett DK, et al., 2019) failed to calculate for the following reasons:   The patient has a prior MI or stroke diagnosis   Patient is participating in a Managed Medicaid Plan:  Yes   A/P: Diabetes diagnosed in 2008. Patient is able to verbalize appropriate hypoglycemia management plan. Medication adherence appears good. Control is suboptimal due to recent elevated  A1c reading of 9.5% on 08/03/22. -Adjusted dose of basal insulin Tresiba (insulin degludec) from 30 units BID to 40 units daily in the morning. -Adjusted dose of rapid insulin Novolog (insulin aspart) from 5 units BID to 15 units BID with Lunch and Dinner meals.  -Continued metformin-XR 2000 mg daily.  -Ordered BMET  -Extensively discussed pathophysiology of diabetes, recommended lifestyle interventions, dietary effects on blood sugar control. Discussed portion sizes and distribution of carbs, proteins, sugars, etc. associated with foods,  drinks, and meals. -Counseled on s/sx of and management of hypoglycemia.  -Next A1c anticipated 11/03/22.   Hypertension diagnosed in 2008. Blood pressure goal of <130/80 mmHg. Medication adherence is good. Blood pressure control is suboptimal due to in-office BP readings above goal. -Continued Valsartan 80 mg daily. -Reevaluate at next visit.   Written patient instructions provided. Patient verbalized understanding of treatment plan.  Total time in face to face counseling 30 minutes.    Follow-up:  Pharmacist 08/31/22. PCP clinic visit in 09/11/22.  Patient seen with Gena Fray, PharmD PGY-1 Pharmacy Resident and Estelle June, PharmD Candidate.

## 2022-08-18 ENCOUNTER — Ambulatory Visit (INDEPENDENT_AMBULATORY_CARE_PROVIDER_SITE_OTHER): Payer: Medicaid Other

## 2022-08-18 DIAGNOSIS — I4581 Long QT syndrome: Secondary | ICD-10-CM | POA: Diagnosis not present

## 2022-08-18 LAB — CUP PACEART REMOTE DEVICE CHECK
Battery Remaining Longevity: 54 mo
Battery Voltage: 2.98 V
Brady Statistic RV Percent Paced: 0 %
Date Time Interrogation Session: 20240402044223
HighPow Impedance: 46 Ohm
HighPow Impedance: 69 Ohm
Implantable Lead Connection Status: 753985
Implantable Lead Implant Date: 20080828
Implantable Lead Location: 753860
Implantable Lead Model: 6947
Implantable Pulse Generator Implant Date: 20161117
Lead Channel Impedance Value: 304 Ohm
Lead Channel Impedance Value: 342 Ohm
Lead Channel Pacing Threshold Amplitude: 1.375 V
Lead Channel Pacing Threshold Pulse Width: 0.4 ms
Lead Channel Sensing Intrinsic Amplitude: 2.25 mV
Lead Channel Sensing Intrinsic Amplitude: 2.25 mV
Lead Channel Setting Pacing Amplitude: 3 V
Lead Channel Setting Pacing Pulse Width: 0.4 ms
Lead Channel Setting Sensing Sensitivity: 0.3 mV
Zone Setting Status: 755011

## 2022-08-18 LAB — BASIC METABOLIC PANEL
BUN/Creatinine Ratio: 11 (ref 9–23)
BUN: 7 mg/dL (ref 6–24)
CO2: 24 mmol/L (ref 20–29)
Calcium: 9.3 mg/dL (ref 8.7–10.2)
Chloride: 98 mmol/L (ref 96–106)
Creatinine, Ser: 0.61 mg/dL (ref 0.57–1.00)
Glucose: 303 mg/dL — ABNORMAL HIGH (ref 70–99)
Potassium: 3.9 mmol/L (ref 3.5–5.2)
Sodium: 136 mmol/L (ref 134–144)
eGFR: 109 mL/min/{1.73_m2} (ref >59)

## 2022-08-18 NOTE — Progress Notes (Signed)
Reviewed and agree with Dr Koval's plan.   

## 2022-08-31 ENCOUNTER — Ambulatory Visit: Payer: Medicaid Other | Admitting: Pharmacist

## 2022-09-07 ENCOUNTER — Ambulatory Visit: Payer: Medicaid Other | Admitting: Pharmacist

## 2022-09-08 ENCOUNTER — Other Ambulatory Visit: Payer: Self-pay

## 2022-09-08 DIAGNOSIS — Z794 Long term (current) use of insulin: Secondary | ICD-10-CM

## 2022-09-08 MED ORDER — GABAPENTIN 300 MG PO CAPS: 300.0000 mg | ORAL_CAPSULE | Freq: Three times a day (TID) | ORAL | 0 refills | Status: DC

## 2022-09-10 NOTE — Progress Notes (Deleted)
    SUBJECTIVE:   CHIEF COMPLAINT / HPI:  No chief complaint on file.   At last visit started valsartan 80 mg.  PERTINENT  PMH / PSH: T2DM, HTN, seizure disorder, COPD, sarcoidosis, Long QT syndrome s/p ICD implantation 2016  Patient Care Team: Littie Deeds, MD as PCP - General (Family Medicine) Marinus Maw, MD as PCP - Cardiology (Cardiology) Marinus Maw, MD as PCP - Electrophysiology (Cardiology) Van Clines, MD as Consulting Physician (Neurology)   OBJECTIVE:   LMP 07/23/2014   Physical Exam      08/03/2022   11:06 AM  Depression screen PHQ 2/9  Decreased Interest 0  Down, Depressed, Hopeless 0  PHQ - 2 Score 0  Altered sleeping 1  Tired, decreased energy 1  Change in appetite 1  Feeling bad or failure about yourself  0  Trouble concentrating 0  Moving slowly or fidgety/restless 0  Suicidal thoughts 0  PHQ-9 Score 3  Difficult doing work/chores Not difficult at all     {Show previous vital signs (optional):23777}  {Labs  Heme  Chem  Endocrine  Serology  Results Review (optional):23779}  ASSESSMENT/PLAN:   No problem-specific Assessment & Plan notes found for this encounter.    No follow-ups on file.   Littie Deeds, MD [facility]

## 2022-09-11 ENCOUNTER — Ambulatory Visit: Payer: Medicaid Other | Admitting: Family Medicine

## 2022-09-21 ENCOUNTER — Telehealth: Payer: Self-pay | Admitting: Pharmacist

## 2022-09-21 NOTE — Telephone Encounter (Signed)
Patient contacted for follow-up of CGM and Diabetes  Left HIPAA compliant message requesting reschedule.    Total time with patient call and documentation of interaction: 8 minutes.

## 2022-09-28 ENCOUNTER — Ambulatory Visit (INDEPENDENT_AMBULATORY_CARE_PROVIDER_SITE_OTHER): Payer: Medicaid Other | Admitting: Family Medicine

## 2022-09-28 VITALS — BP 135/90 | HR 64 | Temp 98.2°F | Ht 67.0 in | Wt 177.0 lb

## 2022-09-28 DIAGNOSIS — E1159 Type 2 diabetes mellitus with other circulatory complications: Secondary | ICD-10-CM

## 2022-09-28 DIAGNOSIS — I152 Hypertension secondary to endocrine disorders: Secondary | ICD-10-CM | POA: Diagnosis not present

## 2022-09-28 DIAGNOSIS — L819 Disorder of pigmentation, unspecified: Secondary | ICD-10-CM | POA: Insufficient documentation

## 2022-09-28 NOTE — Assessment & Plan Note (Signed)
-  BP at goal -continue current antihypertensive regimen -med rec reviewed and updated appropriately

## 2022-09-28 NOTE — Assessment & Plan Note (Addendum)
-  unclear as to exact etiology, does not appear infectious. Possibly from pressure or shoes that she is wearing. Does not appear to be contributing to her neuropathy as that seems to be stable -will continue to monitor closely, close follow up scheduled at the end of the week. Strict return precautions discussed. May consider scheduling in derm clinic if worsens

## 2022-09-28 NOTE — Patient Instructions (Addendum)
It was great seeing you today!  Today we discussed the area on your toe, I am not sure what this is but we would like to monitor it.   If you notice any bleeding, purulent drainage, fever, chills or new numbness or tingling then come in sooner.   Please follow up at your next scheduled appointment on 5/17 at 1:30 pm, if anything arises between now and then, please don't hesitate to contact our office.   Thank you for allowing [place] to be a part of your medical care!  Thank you, Dr. Robyne Peers

## 2022-09-28 NOTE — Progress Notes (Signed)
    SUBJECTIVE:   CHIEF COMPLAINT / HPI:   Patient presents with something on her left big toe along the plantar portion that she first noticed Thursday. Denies any associated trauma or injury. At the time there was a little swelling in both feet but only has a spot on the left foot. She wants to get it checked since she has a history of DM. In the past, her toenail came off of her toe and there was a spot but it was on the dorsal aspect. Denies new numbness or tingling. Walking causes some pain, otherwise it does not bother her. Has neuropathy at baseline, takes gabapentin about 4 times a week, last took it 3 days ago.   OBJECTIVE:   BP (!) 135/90   Pulse 64   Temp 98.2 F (36.8 C) (Oral)   Ht 5\' 7"  (1.702 m)   Wt 177 lb (80.3 kg)   LMP 07/23/2014   SpO2 100%   BMI 27.72 kg/m   General: Patient well-appearing, in no acute distress. Resp: normal work of breathing noted MSK: essentially unchanged foot exam, numbness noted along plantar portion below toes on the left, pedal pulses strong and equal bilaterally Derm: circumscribed hyperpigmented area along plantar portion of the left big toe Neuro: ambulates with assistance of walker    ASSESSMENT/PLAN:   Hyperpigmentation -unclear as to exact etiology, does not appear infectious. Possibly from pressure or shoes that she is wearing. Does not appear to be contributing to her neuropathy as that seems to be stable -will continue to monitor closely, close follow up scheduled at the end of the week. Strict return precautions discussed. May consider scheduling in derm clinic if worsens   Hypertension associated with diabetes (HCC) -BP at goal -continue current antihypertensive regimen -med rec reviewed and updated appropriately      Reece Leader, DO [facility]

## 2022-09-29 NOTE — Progress Notes (Signed)
Remote ICD transmission.   

## 2022-10-02 ENCOUNTER — Encounter: Payer: Self-pay | Admitting: Family Medicine

## 2022-10-02 ENCOUNTER — Ambulatory Visit (INDEPENDENT_AMBULATORY_CARE_PROVIDER_SITE_OTHER): Payer: Medicaid Other | Admitting: Family Medicine

## 2022-10-02 VITALS — BP 134/76 | HR 61 | Ht 67.0 in | Wt 177.4 lb

## 2022-10-02 DIAGNOSIS — L819 Disorder of pigmentation, unspecified: Secondary | ICD-10-CM | POA: Diagnosis not present

## 2022-10-02 NOTE — Assessment & Plan Note (Addendum)
-  still unclear of etiology, possibly secondary due to pressure of uncomfortable shoes -seems to be improving -continue to monitor -return precautions discussed  -follow up in 4-6 weeks to ensure continued improvement and possible resolution

## 2022-10-02 NOTE — Progress Notes (Signed)
    SUBJECTIVE:   CHIEF COMPLAINT / HPI:   Patient with history of DM presents for follow up, noted to have some hyperpigmentation along the plantar portion of her left big toe that she noticed about 2-3 weeks ago. Denies any changes since the last visit, she has not noticed the area pigmentation spreading to other areas. Walking elicits some pain but otherwise she does not seem to be affected by it. She has been wearing comfortable shoes since our last visit.   OBJECTIVE:   BP 134/76   Pulse 61   Ht 5\' 7"  (1.702 m)   Wt 177 lb 6 oz (80.5 kg)   LMP 07/23/2014   SpO2 96%   BMI 27.78 kg/m   General: Patient well-appearing, in no acute distress. Resp: normal work of breathing noted Derm: circumscribed hyperpigmented flat area without drainage or bleeding, seems to have more skin clearing compared to a week ago Ext: distal pulses strong and equal bilaterally, no LE edema noted bilaterally    ASSESSMENT/PLAN:   Hyperpigmentation -still unclear of etiology, possibly secondary due to pressure of uncomfortable shoes -seems to be improving -continue to monitor -return precautions discussed  -follow up in 4-6 weeks to ensure continued improvement and possible resolution     -PHQ-9 score of 6 with negative question 9 reviewed.   Reece Leader, DO [facility]

## 2022-10-02 NOTE — Patient Instructions (Addendum)
It was great seeing you today!  Today we discussed the spot on your foot, I am still unsure of what is causing this but it appears to be benign and improving since last visit. Please continue to monitor this, if you notice it growing, having drainage or bleeding or appearing irregular in shape then please return.  Please make sure to follow up with your PCP to make sure your diabetes stays well-controlled and have regular foot exams.   Please follow up at your next scheduled appointment in 4-6 weeks, if anything arises between now and then, please don't hesitate to contact our office.   Thank you for allowing [place] to be a part of your medical care!  Thank you, Dr. Robyne Peers

## 2022-10-23 ENCOUNTER — Telehealth: Payer: Self-pay

## 2022-10-23 NOTE — Telephone Encounter (Signed)
Attempted to reach [name] for DM/CGM management and to reschedule an appointment with the [facility] pharmacy team. Left voicemail for patient to reach back out.  Irish Elders, PharmD PGY-1 [facility] Pharmacy Resident

## 2022-10-28 IMAGING — CR DG CHEST 2V
2 series · 2 of 2 positions shown · non-contrast
Comparison: None.

CLINICAL DATA: Chest pain

EXAM:
CHEST - 2 VIEW

[chest pa]
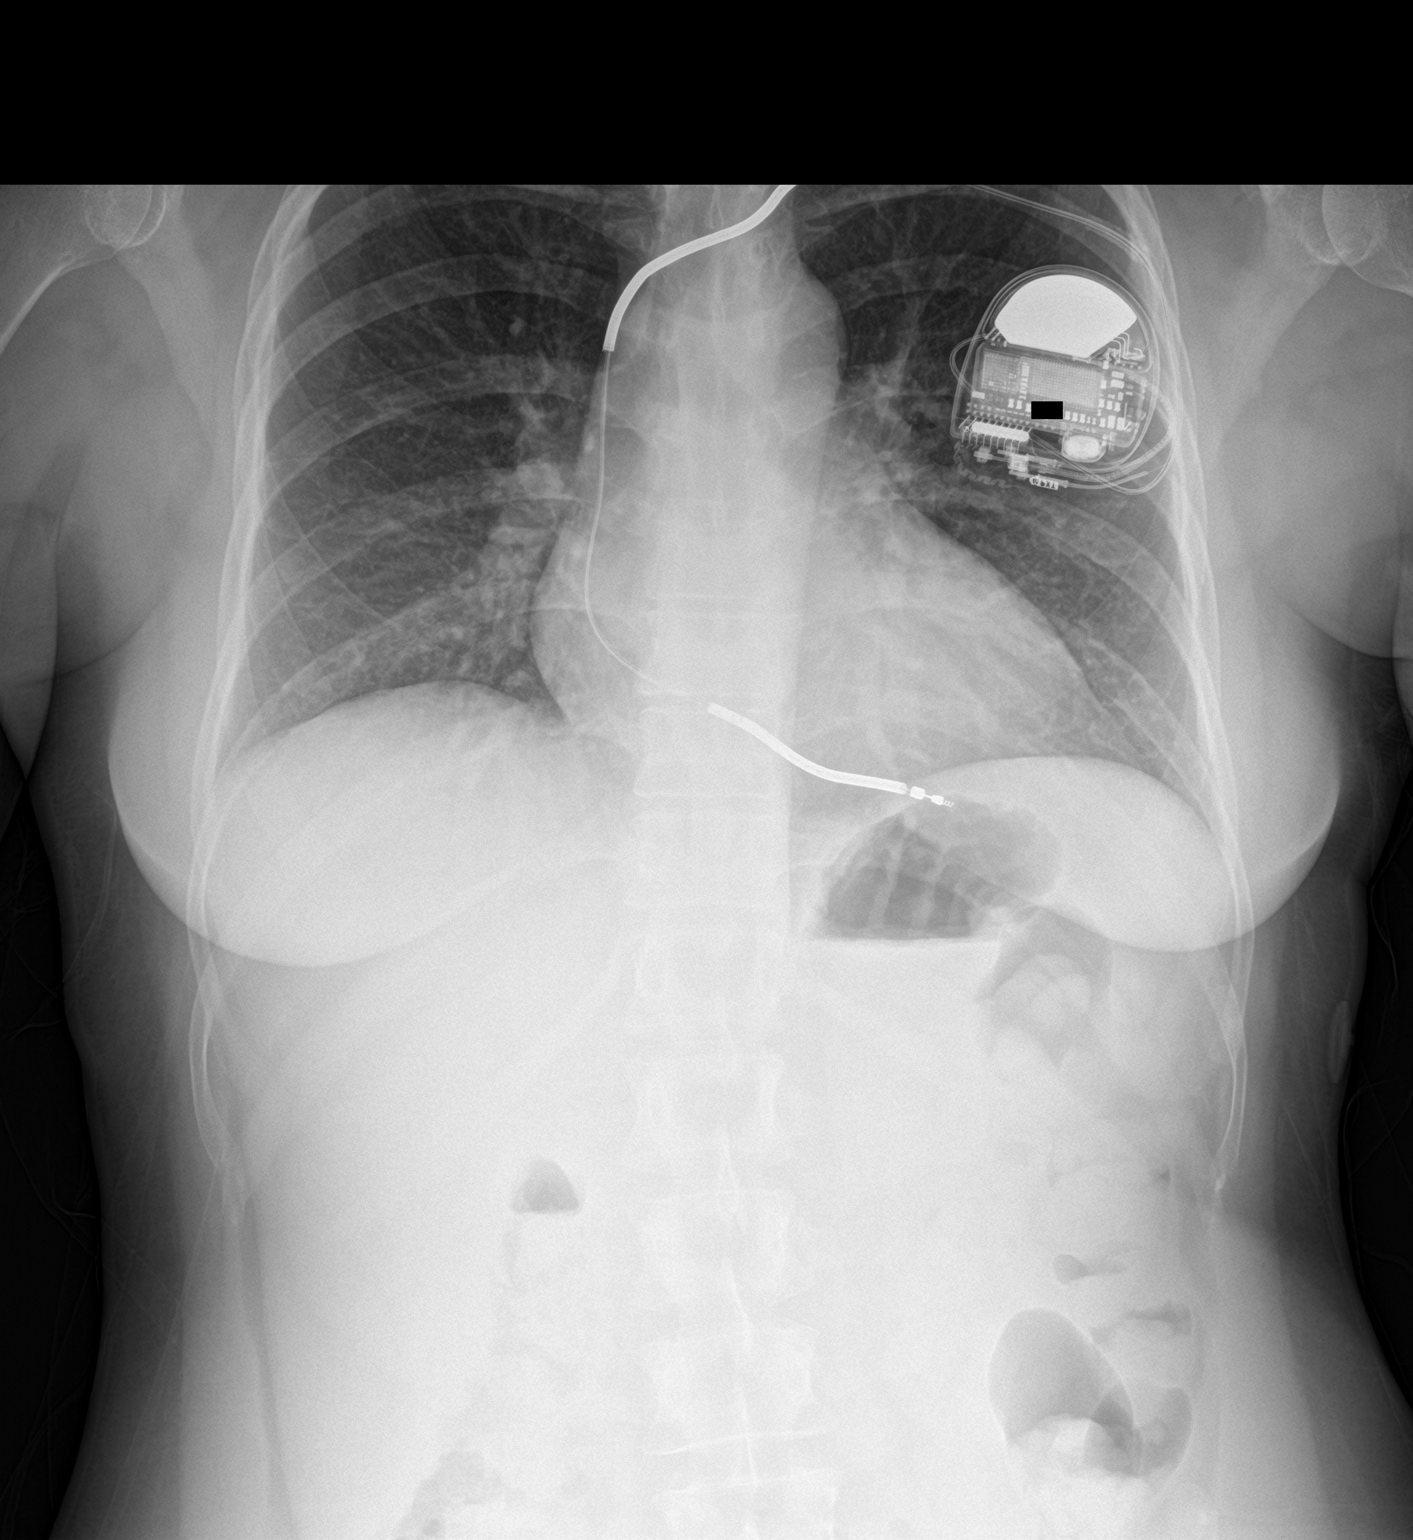

[chest lat]
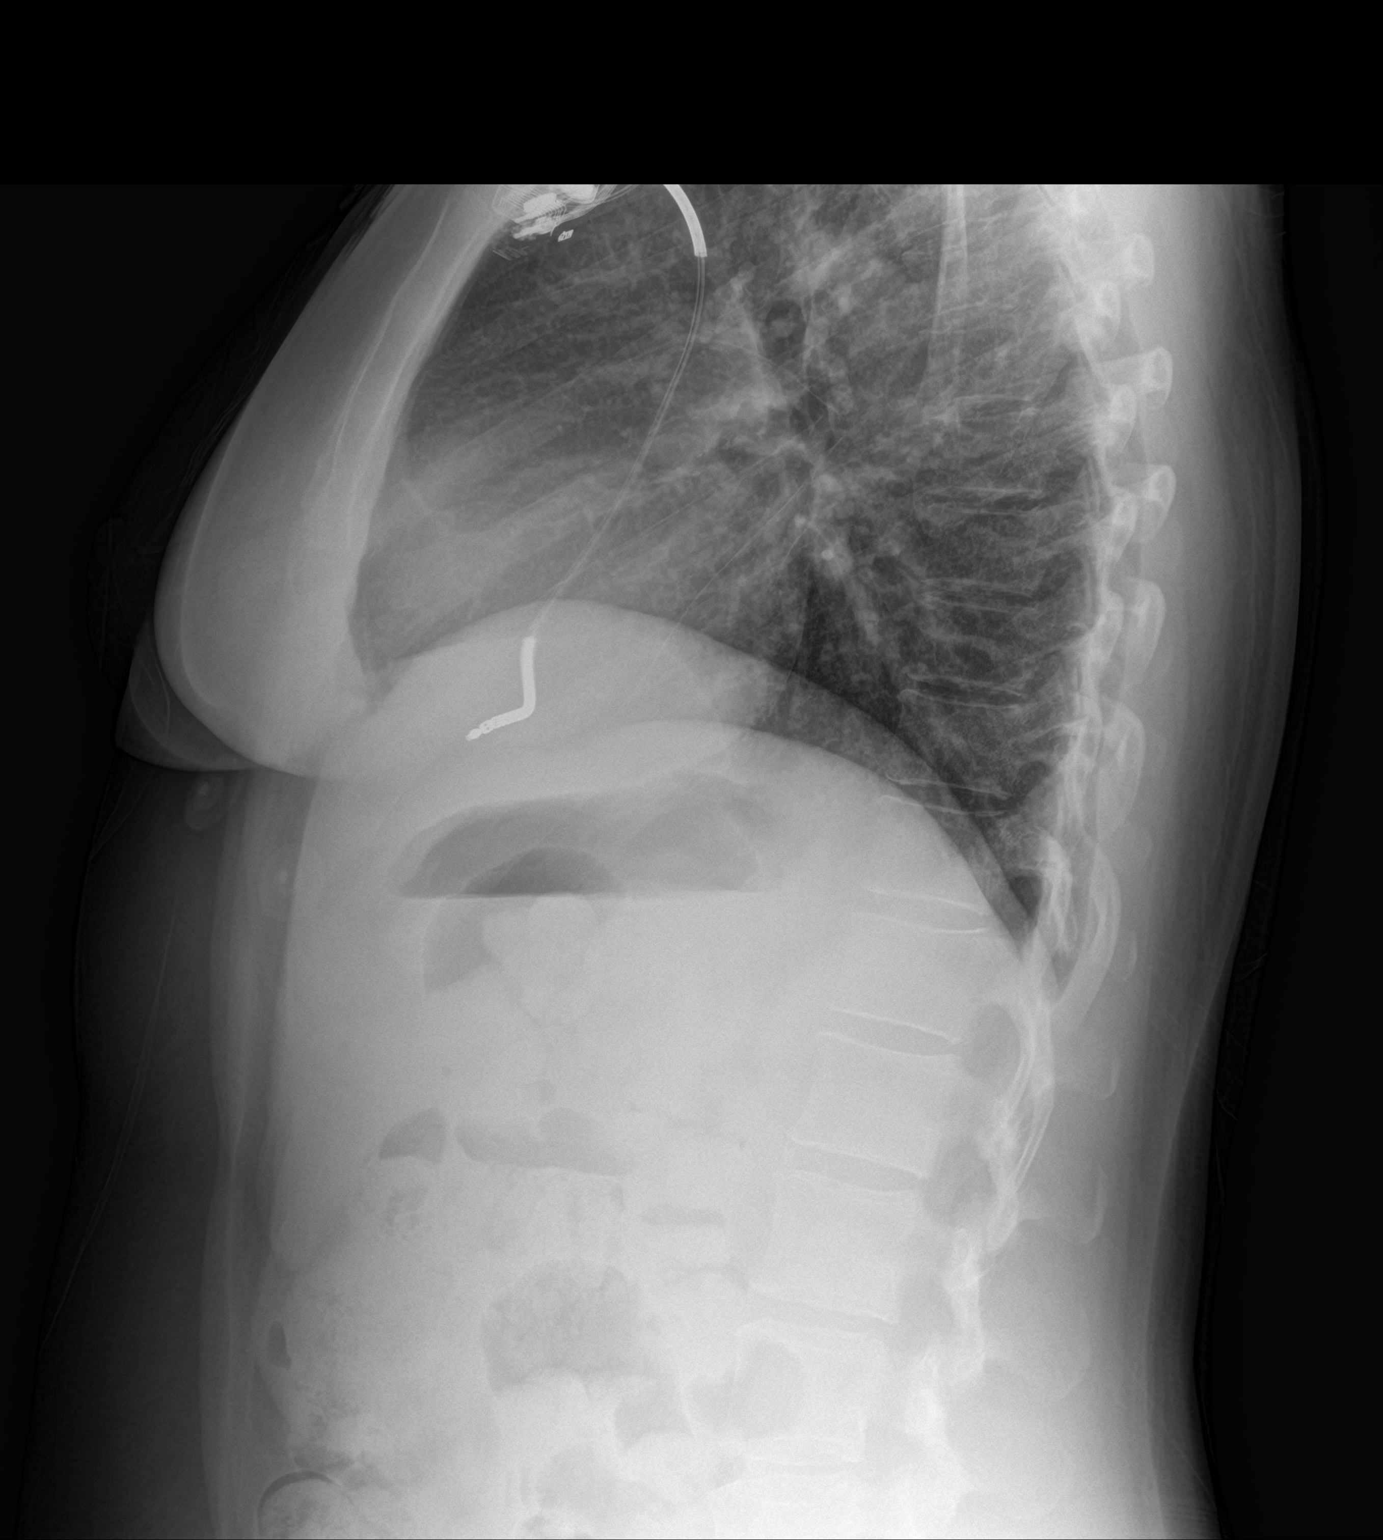

[2 of 2 positions shown; findings below may reference images not displayed]

FINDINGS: The heart size and mediastinal contours are within normal limits. A
left-sided pacemaker seen with the lead tip at the right ventricle.
For for is both lungs are clear. The visualized skeletal structures
are unremarkable.
IMPRESSION: No active cardiopulmonary disease.

## 2022-10-30 ENCOUNTER — Ambulatory Visit (INDEPENDENT_AMBULATORY_CARE_PROVIDER_SITE_OTHER): Payer: Medicaid Other | Admitting: Family Medicine

## 2022-10-30 ENCOUNTER — Encounter: Payer: Self-pay | Admitting: Family Medicine

## 2022-10-30 VITALS — BP 112/68 | HR 70 | Ht 67.0 in | Wt 179.0 lb

## 2022-10-30 DIAGNOSIS — E1169 Type 2 diabetes mellitus with other specified complication: Secondary | ICD-10-CM

## 2022-10-30 DIAGNOSIS — E785 Hyperlipidemia, unspecified: Secondary | ICD-10-CM

## 2022-10-30 DIAGNOSIS — E1159 Type 2 diabetes mellitus with other circulatory complications: Secondary | ICD-10-CM

## 2022-10-30 DIAGNOSIS — E1165 Type 2 diabetes mellitus with hyperglycemia: Secondary | ICD-10-CM

## 2022-10-30 DIAGNOSIS — L820 Inflamed seborrheic keratosis: Secondary | ICD-10-CM | POA: Insufficient documentation

## 2022-10-30 DIAGNOSIS — Z794 Long term (current) use of insulin: Secondary | ICD-10-CM | POA: Diagnosis not present

## 2022-10-30 DIAGNOSIS — R221 Localized swelling, mass and lump, neck: Secondary | ICD-10-CM | POA: Diagnosis not present

## 2022-10-30 DIAGNOSIS — I152 Hypertension secondary to endocrine disorders: Secondary | ICD-10-CM

## 2022-10-30 LAB — POCT GLYCOSYLATED HEMOGLOBIN (HGB A1C): HbA1c, POC (controlled diabetic range): 11 % — AB (ref 0.0–7.0)

## 2022-10-30 NOTE — Assessment & Plan Note (Addendum)
Uncontrolled, worsening.  Likely related to dietary intake.  Also she did not increase her NovoLog as previously directed, sounds like there was some confusion about her insulin dosing. - advised to increase Novolog 15 units BIDAC as previously instructed - continue Tresiba 40 units BID - continue metformin 2000 mg daily - advised to monitor carbohydrate intake such as sweet potatoes - advised to follow-up in pharmacy clinic in 1-2 weeks

## 2022-10-30 NOTE — Assessment & Plan Note (Signed)
On statin, due for lipid panel

## 2022-10-30 NOTE — Assessment & Plan Note (Signed)
Chronic hyperpigmented plaque on anterior neck now associated with intermittent pruritus and possibly enlarging.  Most consistent with seborrheic keratosis.  Discussed that we could consider cryotherapy if she desires removal.

## 2022-10-30 NOTE — Assessment & Plan Note (Signed)
Well-controlled on current medications, continue valsartan 80 mg

## 2022-10-30 NOTE — Patient Instructions (Addendum)
It was nice seeing you today!  Go to your CT scan as scheduled.  Try to cut back on sweet potatoes.  Continue the [place] and metformin. Increase your Novolog to 15 units twice a day with meals.  Stay well, Loretta Deeds, MD [facility] [phone]  --  Make sure to check out at the front desk before you leave today.  Please arrive at least 15 minutes prior to your scheduled appointments.  If you had blood work today, I will send you a MyChart message or a letter if results are normal. Otherwise, I will give you a call.  If you had a referral placed, they will call you to set up an appointment. Please give [place] a call if you don't hear back in the next 2 weeks.  If you need additional refills before your next appointment, please call your pharmacy first.

## 2022-10-30 NOTE — Assessment & Plan Note (Signed)
Left-sided fullness of the neck present for at least 3 months.  Thyroid ultrasound done previously without any abnormal findings.  She is otherwise asymptomatic.  There is possible fullness of the left SCM.  Will obtain CT neck without contrast (given severe anaphylactic reaction to contrast previously) for further characterization and to rule out malignancy.

## 2022-10-30 NOTE — Progress Notes (Signed)
SUBJECTIVE:   CHIEF COMPLAINT / HPI:  Chief Complaint  Patient presents with   Follow-up    Toe concerns    Back Pain    Swelling moved from back to left shoulder and next x 1 week     At last visit with clinical pharmacist 2 months ago, diabetic regimen was adjusted.  Tresiba adjusted to 40 units daily, NovoLog increased to 15 units twice daily AC.  Continued on metformin 2000 mg daily. Reports she has been drinking more diet ginger ale lately, has been trying to keep soda no more than 1 can per day but has been drinking more lately. Eating 3-4 sweet potatoes daily. Reports she has been taking [place] 40 units twice a day, Novolog 4-5 units twice daily with meals. Denies symptomatic hypoglycemia. Reports fasting sugars 148-190s. Unsure of postprandial sugars.  Still feeling some swelling around her neck on the left side. Denies dysphagia, difficulty speaking, SOB, fever, chills, unintentional weight loss.  Patient reports mole on the front of her neck which has been present for a long time.  She reports that lately, she has had more itching around the mole.  Thinks that maybe it is increasing in size slightly.  Foot is doing better.  She still has some callused areas the lateral aspect of the toe, but otherwise she reports her toe is back to normal.  PERTINENT  PMH / PSH: T2DM, HTN, seizure disorder, COPD, sarcoidosis, Long QT syndrome s/p ICD implantation 2016  Patient Care Team: Littie Deeds, MD as PCP - General (Family Medicine) Marinus Maw, MD as PCP - Cardiology (Cardiology) Marinus Maw, MD as PCP - Electrophysiology (Cardiology) Van Clines, MD as Consulting Physician (Neurology)   OBJECTIVE:   BP 112/68   Pulse 70   Ht 5\' 7"  (1.702 m)   Wt 179 lb (81.2 kg)   LMP 07/23/2014   SpO2 98%   BMI 28.04 kg/m   Physical Exam Constitutional:      General: She is not in acute distress. HENT:     Head: Normocephalic and atraumatic.     Mouth/Throat:      Mouth: Mucous membranes are moist.     Pharynx: Oropharynx is clear. No oropharyngeal exudate or posterior oropharyngeal erythema.  Neck:     Comments: Questionable fullness of the left SCM Cardiovascular:     Rate and Rhythm: Normal rate and regular rhythm.     Heart sounds: Normal heart sounds.  Pulmonary:     Effort: Pulmonary effort is normal. No respiratory distress.     Breath sounds: Normal breath sounds.  Musculoskeletal:     Cervical back: Neck supple.  Lymphadenopathy:     Cervical: No cervical adenopathy.  Skin:    Comments: Waxy hyperpigmented stuck on lesion of the anterior neck  Neurological:     Mental Status: She is alert.        10/30/2022    1:26 PM  Depression screen PHQ 2/9  Decreased Interest 0  Down, Depressed, Hopeless 0  PHQ - 2 Score 0  Altered sleeping 1  Tired, decreased energy 1  Change in appetite 1  Feeling bad or failure about yourself  0  Trouble concentrating 1  Moving slowly or fidgety/restless 1  Suicidal thoughts 0  PHQ-9 Score 5     Wt Readings from Last 3 Encounters:  10/30/22 179 lb (81.2 kg)  10/02/22 177 lb 6 oz (80.5 kg)  09/28/22 177 lb (80.3 kg)  Last hemoglobin A1c Lab Results  Component Value Date   HGBA1C 11.0 (A) 10/30/2022      ASSESSMENT/PLAN:   Problem List Items Addressed This Visit       Cardiovascular and Mediastinum   Hypertension associated with diabetes (HCC)    Well-controlled on current medications, continue valsartan 80 mg        Endocrine   Diabetes mellitus, type 2 (HCC) - Primary    Uncontrolled, worsening.  Likely related to dietary intake.  Also she did not increase her NovoLog as previously directed, sounds like there was some confusion about her insulin dosing. - advised to increase Novolog 15 units BIDAC as previously instructed - continue Tresiba 40 units BID - continue metformin 2000 mg daily - advised to monitor carbohydrate intake such as sweet potatoes - advised to  follow-up in pharmacy clinic in 1-2 weeks      Relevant Orders   HgB A1c (Completed)   Microalbumin/Creatinine Ratio, Urine   Hyperlipidemia associated with type 2 diabetes mellitus (HCC)    On statin, due for lipid panel      Relevant Orders   Lipid Panel     Musculoskeletal and Integument   Inflamed seborrheic keratosis    Chronic hyperpigmented plaque on anterior neck now associated with intermittent pruritus and possibly enlarging.  Most consistent with seborrheic keratosis.  Discussed that we could consider cryotherapy if she desires removal.        Other   Localized swelling, mass or lump of neck    Left-sided fullness of the neck present for at least 3 months.  Thyroid ultrasound done previously without any abnormal findings.  She is otherwise asymptomatic.  There is possible fullness of the left SCM.  Will obtain CT neck without contrast (given severe anaphylactic reaction to contrast previously) for further characterization and to rule out malignancy.      Relevant Orders   CT SOFT TISSUE NECK WO CONTRAST     HCM - will be making appt with Dr. Nile Riggs - reports UTD on pap smear and mammogram, done through her OB/GYN  Return in about 3 months (around 01/30/2023) for f/u diabetes.   Littie Deeds, MD [facility]

## 2022-10-31 LAB — LIPID PANEL
Chol/HDL Ratio: 4.7 ratio — ABNORMAL HIGH (ref 0.0–4.4)
Cholesterol, Total: 196 mg/dL (ref 100–199)
HDL: 42 mg/dL (ref >39)
LDL Chol Calc (NIH): 117 mg/dL — ABNORMAL HIGH (ref 0–99)
Triglycerides: 214 mg/dL — ABNORMAL HIGH (ref 0–149)
VLDL Cholesterol Cal: 37 mg/dL (ref 5–40)

## 2022-10-31 LAB — MICROALBUMIN / CREATININE URINE RATIO
Creatinine, Urine: 54 mg/dL
Microalb/Creat Ratio: 37 mg/g creat — ABNORMAL HIGH (ref 0–29)
Microalbumin, Urine: 20.2 ug/mL

## 2022-11-11 ENCOUNTER — Ambulatory Visit: Payer: Medicaid Other | Admitting: Pharmacist

## 2022-11-16 ENCOUNTER — Telehealth: Payer: Self-pay

## 2022-11-16 NOTE — Telephone Encounter (Signed)
Patient is scheduled for July 6th at 245 . Left on voice mail if she can not make it to this appt then she can reschedule at [phone].

## 2022-11-16 NOTE — Telephone Encounter (Signed)
Ok to schedule at [facility] Authorization number [id number] Valid 11/16/2022-01/14/2023 Sunday Spillers, CMA

## 2022-11-17 ENCOUNTER — Ambulatory Visit: Payer: Medicaid Other

## 2022-11-17 DIAGNOSIS — I4581 Long QT syndrome: Secondary | ICD-10-CM | POA: Diagnosis not present

## 2022-11-17 LAB — CUP PACEART REMOTE DEVICE CHECK
Battery Remaining Longevity: 49 mo
Battery Voltage: 2.98 V
Brady Statistic RV Percent Paced: 0 %
Date Time Interrogation Session: 20240702033523
HighPow Impedance: 46 Ohm
HighPow Impedance: 71 Ohm
Implantable Lead Connection Status: 753985
Implantable Lead Implant Date: 20080828
Implantable Lead Location: 753860
Implantable Lead Model: 6947
Implantable Pulse Generator Implant Date: 20161117
Lead Channel Impedance Value: 323 Ohm
Lead Channel Impedance Value: 380 Ohm
Lead Channel Pacing Threshold Amplitude: 1.375 V
Lead Channel Pacing Threshold Pulse Width: 0.4 ms
Lead Channel Sensing Intrinsic Amplitude: 4 mV
Lead Channel Sensing Intrinsic Amplitude: 4 mV
Lead Channel Setting Pacing Amplitude: 3 V
Lead Channel Setting Pacing Pulse Width: 0.4 ms
Lead Channel Setting Sensing Sensitivity: 0.3 mV
Zone Setting Status: 755011

## 2022-11-18 ENCOUNTER — Telehealth: Payer: Self-pay

## 2022-11-18 NOTE — Telephone Encounter (Signed)
Chart review completed for patient. Patient is due for screening mammogram. Mychart message sent to patient to inquire about scheduling mammogram.  Paloma Grange, Population Health Specialist.  

## 2022-11-21 ENCOUNTER — Ambulatory Visit (HOSPITAL_BASED_OUTPATIENT_CLINIC_OR_DEPARTMENT_OTHER): Payer: Medicaid Other

## 2022-12-07 ENCOUNTER — Other Ambulatory Visit (HOSPITAL_COMMUNITY)
Admission: RE | Admit: 2022-12-07 | Discharge: 2022-12-07 | Disposition: A | Discharge status: Home / Self Care | Payer: Medicaid Other | Source: Ambulatory Visit | Attending: Advanced Practice Midwife | Admitting: Advanced Practice Midwife

## 2022-12-07 ENCOUNTER — Encounter: Payer: Self-pay | Admitting: Advanced Practice Midwife

## 2022-12-07 ENCOUNTER — Ambulatory Visit: Payer: Medicaid Other | Admitting: Advanced Practice Midwife

## 2022-12-07 VITALS — BP 134/82 | HR 66 | Ht 66.5 in | Wt 179.0 lb

## 2022-12-07 DIAGNOSIS — N898 Other specified noninflammatory disorders of vagina: Secondary | ICD-10-CM | POA: Diagnosis not present

## 2022-12-07 MED ORDER — TERCONAZOLE 0.4 % VA CREA: 1.0000 | TOPICAL_CREAM | Freq: Every day | VAGINAL | 0 refills | Status: DC

## 2022-12-07 NOTE — Progress Notes (Signed)
Pt presents for vaginal itching for 2 weeks. Pt tried OTC meds. Pt  reports recurring yeast infection.

## 2022-12-07 NOTE — Addendum Note (Signed)
Addended by: Jearld Adjutant on: 12/07/2022 02:43 PM   Modules accepted: Orders

## 2022-12-07 NOTE — Progress Notes (Signed)
Pt preferred to self swab today and not see provider.  Given symptoms and hx yeast infections with DM, will Terazol 7 to pharmacy.  Pt has hx prolonged QT so Diflucan not prescribed due to risk.

## 2022-12-08 LAB — CERVICOVAGINAL ANCILLARY ONLY
Bacterial Vaginitis (gardnerella): NEGATIVE
Candida Glabrata: NEGATIVE
Candida Vaginitis: POSITIVE — AB
Chlamydia: NEGATIVE
Comment: NEGATIVE
Comment: NEGATIVE
Comment: NEGATIVE
Comment: NEGATIVE
Comment: NEGATIVE
Comment: NORMAL
Neisseria Gonorrhea: NEGATIVE
Trichomonas: NEGATIVE

## 2022-12-11 NOTE — Progress Notes (Signed)
Remote ICD transmission.   

## 2023-02-16 ENCOUNTER — Ambulatory Visit (INDEPENDENT_AMBULATORY_CARE_PROVIDER_SITE_OTHER): Payer: Medicaid Other

## 2023-02-16 DIAGNOSIS — I4581 Long QT syndrome: Secondary | ICD-10-CM

## 2023-02-17 LAB — CUP PACEART REMOTE DEVICE CHECK
Battery Remaining Longevity: 47 mo
Battery Voltage: 2.98 V
Brady Statistic RV Percent Paced: 0 %
Date Time Interrogation Session: 20241001224224
HighPow Impedance: 54 Ohm
HighPow Impedance: 82 Ohm
Implantable Lead Connection Status: 753985
Implantable Lead Implant Date: 20080828
Implantable Lead Location: 753860
Implantable Lead Model: 6947
Implantable Pulse Generator Implant Date: 20161117
Lead Channel Impedance Value: 342 Ohm
Lead Channel Impedance Value: 399 Ohm
Lead Channel Pacing Threshold Amplitude: 1.375 V
Lead Channel Pacing Threshold Pulse Width: 0.4 ms
Lead Channel Sensing Intrinsic Amplitude: 3.875 mV
Lead Channel Sensing Intrinsic Amplitude: 3.875 mV
Lead Channel Setting Pacing Amplitude: 3 V
Lead Channel Setting Pacing Pulse Width: 0.4 ms
Lead Channel Setting Sensing Sensitivity: 0.3 mV
Zone Setting Status: 755011

## 2023-03-03 NOTE — Progress Notes (Signed)
Remote ICD transmission.   

## 2023-03-15 ENCOUNTER — Emergency Department (HOSPITAL_COMMUNITY)
Admission: EM | Admit: 2023-03-15 | Discharge: 2023-03-15 | Disposition: A | Discharge status: Home / Self Care | Payer: Medicaid Other | Attending: Emergency Medicine | Admitting: Emergency Medicine

## 2023-03-15 ENCOUNTER — Encounter (HOSPITAL_COMMUNITY): Payer: Self-pay

## 2023-03-15 ENCOUNTER — Other Ambulatory Visit: Payer: Self-pay

## 2023-03-15 DIAGNOSIS — Z7951 Long term (current) use of inhaled steroids: Secondary | ICD-10-CM | POA: Diagnosis not present

## 2023-03-15 DIAGNOSIS — S161XXA Strain of muscle, fascia and tendon at neck level, initial encounter: Secondary | ICD-10-CM | POA: Diagnosis not present

## 2023-03-15 DIAGNOSIS — Z79899 Other long term (current) drug therapy: Secondary | ICD-10-CM | POA: Diagnosis not present

## 2023-03-15 DIAGNOSIS — S29012A Strain of muscle and tendon of back wall of thorax, initial encounter: Secondary | ICD-10-CM | POA: Diagnosis not present

## 2023-03-15 DIAGNOSIS — I10 Essential (primary) hypertension: Secondary | ICD-10-CM | POA: Diagnosis not present

## 2023-03-15 DIAGNOSIS — Z7982 Long term (current) use of aspirin: Secondary | ICD-10-CM | POA: Insufficient documentation

## 2023-03-15 DIAGNOSIS — Z794 Long term (current) use of insulin: Secondary | ICD-10-CM | POA: Diagnosis not present

## 2023-03-15 DIAGNOSIS — E1169 Type 2 diabetes mellitus with other specified complication: Secondary | ICD-10-CM | POA: Diagnosis not present

## 2023-03-15 DIAGNOSIS — M546 Pain in thoracic spine: Secondary | ICD-10-CM | POA: Diagnosis present

## 2023-03-15 DIAGNOSIS — E1140 Type 2 diabetes mellitus with diabetic neuropathy, unspecified: Secondary | ICD-10-CM | POA: Insufficient documentation

## 2023-03-15 DIAGNOSIS — S29019A Strain of muscle and tendon of unspecified wall of thorax, initial encounter: Secondary | ICD-10-CM | POA: Diagnosis not present

## 2023-03-15 DIAGNOSIS — J449 Chronic obstructive pulmonary disease, unspecified: Secondary | ICD-10-CM | POA: Diagnosis not present

## 2023-03-15 DIAGNOSIS — E785 Hyperlipidemia, unspecified: Secondary | ICD-10-CM | POA: Diagnosis not present

## 2023-03-15 DIAGNOSIS — J45909 Unspecified asthma, uncomplicated: Secondary | ICD-10-CM | POA: Diagnosis not present

## 2023-03-15 DIAGNOSIS — X58XXXA Exposure to other specified factors, initial encounter: Secondary | ICD-10-CM | POA: Diagnosis not present

## 2023-03-15 DIAGNOSIS — T148XXA Other injury of unspecified body region, initial encounter: Secondary | ICD-10-CM

## 2023-03-15 LAB — I-STAT CHEM 8, ED
BUN: 12 mg/dL (ref 6–20)
Calcium, Ion: 1.19 mmol/L (ref 1.15–1.40)
Chloride: 103 mmol/L (ref 98–111)
Creatinine, Ser: 0.5 mg/dL (ref 0.44–1.00)
Glucose, Bld: 235 mg/dL — ABNORMAL HIGH (ref 70–99)
HCT: 39 % (ref 36.0–46.0)
Hemoglobin: 13.3 g/dL (ref 12.0–15.0)
Potassium: 3.6 mmol/L (ref 3.5–5.1)
Sodium: 138 mmol/L (ref 135–145)
TCO2: 23 mmol/L (ref 22–32)

## 2023-03-15 NOTE — Discharge Instructions (Addendum)
You may use over-the-counter Ibuprofen (Motrin. Advil) or Naproxen (Aleve) not both. Additionally you can take Acetaminophen (Tylenol), topical muscle creams such as SalonPas, [employer], Bengay, etc. Please stretch, apply ice or heat (whichever helps), and have massage therapy for additional assistance.  For pain control you may take 1000 mg of acetaminophen (Tylenol) every 8 hours with or without either 600 mg of Ibuprofen (Motrin, Advil, etc.) every 6-8 hours or 220 mg of Naproxen (Aleve) every 12 hours  as needed.  Please limit 24 hr dosing of the following medicines: - Acetaminophen (Tylenol) to 4000 mg - Ibuprofen (Motrin, Advil, etc.) to 2400 mg - Naproxen (Aleve) to 880 mg    Please note that other over-the-counter medicine may contain acetaminophen or ibuprofen as a component of their ingredients.

## 2023-03-15 NOTE — ED Triage Notes (Signed)
Pt back pain started Saturday. Pt has had back pain before but not this severe. Pt denies injury to back. Pain is in left back and in right should. Pain is muscular.Pt has used lidocaine patches which have not helped. Pt is ambulatory

## 2023-03-15 NOTE — ED Provider Notes (Signed)
[place] [facility]MENT AT [facility] Provider Note  CSN: [id number] Arrival date & time: 03/15/23 7829  Chief Complaint(s) Back Pain  HPI [name] is a 52 y.o. female     Back Pain Location:  Thoracic spine Quality:  Aching, burning and stiffness Pain severity:  Moderate Onset quality:  Gradual Duration:  2 days Timing:  Constant Progression:  Worsening Chronicity:  Recurrent Relieved by:  Being still Worsened by:  Touching, twisting, bending and movement Associated symptoms: no chest pain     Past Medical History Past Medical History:  Diagnosis Date   Abdominal pain, periumbilic    Allergic rhinitis    Allergy    Anemia    Anxiety    Arthritis    Asthma    Automatic implantable cardioverter-defibrillator in situ 2006, replaced 2008   [employer]   Bronchitis    Cancer (HCC)    Nodules in lungs pt believes were cancerous pt unsure   Constipation 02/08/2008   Qualifier: Diagnosis of   By: Arlyce Dice MD, Barbette Hair      COPD (chronic obstructive pulmonary disease) (HCC)    DM (diabetes mellitus) (HCC) 1997/1998   type 1, initially gestational but soon after child birth developed IDDM   Esophageal stricture 2005/2009   esophageal strictures dilated 2005, 2009   Eye hemorrhage    bilateral   Fibroid, uterine    Fibromyalgia    GERD (gastroesophageal reflux disease) 2005   History of blood transfusion years ago   Hypertension    Internal hemorrhoids 2010   2011 band ligation of int rrhoids.   Long Q-T syndrome    Lupus    questionable   Neuropathy in diabetes [facility])    Palpitation    Sarcoidosis    Seizure disorder (HCC)    mid June 2022 last seizure pt reported   Stroke [facility])    QUESTIONABLE TIA .  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   COPD (chronic obstructive pulmonary disease) (HCC) 11/06/2014   Fibroid uterus 08/30/2014   PSVT 02/24/2010   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 Authorizing Provider  Accu-Chek Softclix Lancets lancets Use as instructed 10/08/21   Littie Deeds, MD  acetaminophen (TYLENOL 8 HOUR) 650 MG CR tablet Take 1 tablet (650 mg total) by mouth every 8 (eight) hours as needed for pain. Patient not taking: Reported on 08/17/2022 01/06/22   Moses Manners, MD  acetaminophen (TYLENOL) 500 MG  tablet Take 1,000 mg by mouth every 6 (six) hours as needed for mild pain or fever. Reported on 07/22/2015    [provider]  aspirin EC 81 MG tablet Take 1 tablet (81 mg total) by mouth daily. 07/17/19   Van Clines, MD  atorvastatin (LIPITOR) 10 MG tablet Take 1 tablet (10 mg total) by mouth daily. 12/09/21   Littie Deeds, MD  azelastine (ASTELIN) 0.1 % nasal spray Place 2 sprays into both nostrils 2 (two) times daily. Use in each nostril as directed 06/06/21   Cobb, Ruby Cola, NP  Blood Pressure  Monitoring (CARETOUCH BP ARM MONITOR) DEVI Use as needed to check blood pressure. 08/03/22   Littie Deeds, MD  Camphor-Menthol-Methyl Sal (SALONPAS) 3.05-23-08 % [facility] Apply 1 patch topically daily as needed (pain).    [provider]  Continuous Blood Gluc Sensor (FREESTYLE LIBRE 3 SENSOR) MISC Place 1 sensor on the skin every 14 days. Use to check glucose continuously 08/17/22   McDiarmid, Leighton Roach, MD  cycloSPORINE (RESTASIS) 0.05 % ophthalmic emulsion Place 1 drop into both eyes daily. Patient not taking: Reported on 08/17/2022    [provider]  fluticasone (FLONASE) 50 MCG/ACT nasal spray Place 2 sprays into both nostrils daily as needed for allergies.     [provider]  fluticasone (FLOVENT HFA) 110 MCG/ACT inhaler Inhale 2 puffs into the lungs 2 (two) times daily. 06/18/21   Cobb, Ruby Cola, NP  gabapentin (NEURONTIN) 300 MG capsule Take 1 capsule (300 mg total) by mouth 3 (three) times daily. 09/08/22 12/07/22  Littie Deeds, MD  glucose blood (ACCU-CHEK GUIDE) test strip Check sugars four times daily 10/01/21   Littie Deeds, MD  insulin degludec (TRESIBA FLEXTOUCH) 200 UNIT/ML FlexTouch Pen Inject 40 Units into the skin daily. 08/17/22   McDiarmid, Leighton Roach, MD  Insulin Pen Needle (B-D ULTRAFINE III SHORT PEN) 31G X 8 MM MISC USE 6 TIMES DAILY 07/16/22   Littie Deeds, MD  Insulin Pen Needle (NOVOFINE) 30G X 8 MM MISC Inject 10 each into the skin as needed. 07/03/22   Littie Deeds, MD  ketotifen (ZADITOR) 0.025 % ophthalmic solution 1 drop 2 (two) times daily.    [provider]  lamoTRIgine (LAMICTAL) 150 MG tablet Take 1 tablet (150 mg total) by mouth 2 (two) times daily. 11/22/19   Van Clines, MD  Lancet Devices [facility]) lancets Use as instructed 10/20/16   Arvilla Market, MD  linaclotide Karlene Einstein) 290 MCG CAPS capsule Take 1 capsule (290 mcg total) by mouth daily before breakfast. 05/25/22   Zehr, Princella Pellegrini, PA-C  metFORMIN (GLUCOPHAGE-XR) 500 MG  24 hr tablet Take 2 tablets (1,000 mg total) by mouth 2 (two) times daily. 01/06/22   Moses Manners, MD  NOVOLOG FLEXPEN 100 UNIT/ML FlexPen Inject 15 Units into the skin 2 (two) times daily before lunch and supper. 08/17/22   McDiarmid, Leighton Roach, MD  pantoprazole (PROTONIX) 40 MG tablet Take 1 tablet (40 mg total) by mouth daily. 06/06/21   Cobb, Ruby Cola, NP  Prenat-Fe Poly-Methfol-FA-DHA (VITAFOL ULTRA) 29-0.6-0.4-200 MG CAPS Take 1 capsule by mouth daily before breakfast. 01/06/22   Moses Manners, MD  propranolol (INDERAL) 80 MG tablet Take 1 tablet (80 mg total) by mouth 2 (two) times daily. 02/05/22   Marinus Maw, MD  terconazole (TERAZOL 7) 0.4 % vaginal cream Place 1 applicator vaginally at bedtime. 12/07/22   Leftwich-Kirby, Wilmer Floor, CNM  valsartan (DIOVAN) 80 MG tablet Take  1 tablet (80 mg total) by mouth at bedtime. 08/03/22   Littie Deeds, MD                                                                                                                                    Allergies Contrast media [iodinated contrast media], Iohexol, Augmentin [amoxicillin-pot clavulanate], Codeine, Demerol [meperidine], Morphine and codeine, and Lantus [insulin glargine]  Review of Systems Review of Systems  Cardiovascular:  Negative for chest pain.  Musculoskeletal:  Positive for back pain.   As noted in HPI  Physical Exam Vital Signs  I have reviewed the triage vital signs BP 136/78 (BP Location: Right Arm)   Pulse 66   Temp 98 F (36.7 C) (Oral)   Resp 18   Ht 5\' 6"  (1.676 m)   Wt 80.7 kg   LMP 07/23/2014   SpO2 99%   BMI 28.73 kg/m   Physical Exam Vitals reviewed.  Constitutional:      General: She is not in acute distress.    Appearance: She is well-developed. She is not diaphoretic.  HENT:     Head: Normocephalic and atraumatic.     Nose: Nose normal.  Eyes:     General: No scleral icterus.       Right eye: No discharge.        Left eye: No discharge.      Conjunctiva/sclera: Conjunctivae normal.     Pupils: Pupils are equal, round, and reactive to light.  Cardiovascular:     Rate and Rhythm: Normal rate and regular rhythm.     Heart sounds: No murmur heard.    No friction rub. No gallop.  Pulmonary:     Effort: Pulmonary effort is normal. No respiratory distress.     Breath sounds: Normal breath sounds. No stridor. No rales.  Abdominal:     General: There is no distension.     Palpations: Abdomen is soft.     Tenderness: There is no abdominal tenderness.  Musculoskeletal:     Cervical back: Normal range of motion and neck supple. Spasms and tenderness present.     Thoracic back: Spasms and tenderness present.       Back:  Skin:    General: Skin is warm and dry.     Findings: No erythema or rash.  Neurological:     Mental Status: She is alert and oriented to person, place, and time.     ED Results and Treatments Labs (all labs ordered are listed, but only abnormal results are displayed) Labs Reviewed  I-STAT CHEM 8, ED - Abnormal; Notable for the following components:      Result Value   Glucose, Bld 235 (*)    All other components within normal limits  EKG  EKG Interpretation Date/Time:    Ventricular Rate:    PR Interval:    QRS Duration:    QT Interval:    QTC Calculation:   R Axis:      Text Interpretation:         Radiology No results found.  Medications Ordered in ED Medications - No data to display Procedures Procedures  (including critical care time) Medical Decision Making / ED Course   Medical Decision Making   Consistent with muscle strain/spasm I-STAT Chem-8 negative for hypokalemia.  Patient already using Salonpas patches.  Recommended additional supportive management with over-the-counter medicine.    Final Clinical Impression(s) / ED Diagnoses Final diagnoses:   Muscle strain   The patient appears reasonably screened and/or stabilized for discharge and I doubt any other medical condition or other [facility] requiring further screening, evaluation, or treatment in the ED at this time. I have discussed the findings, Dx and Tx plan with the patient/family who expressed understanding and agree(s) with the plan. Discharge instructions discussed at length. The patient/family was given strict return precautions who verbalized understanding of the instructions. No further questions at time of discharge.  Disposition: Discharge  Condition: Good  ED Discharge Orders     None       Follow Up: Primary care provider  Call  to schedule an appointment for close follow up    This chart was dictated using voice recognition software.  Despite best efforts to proofread,  errors can occur which can change the documentation meaning.    Nira Conn, MD 03/15/23 [phone]

## 2023-03-26 ENCOUNTER — Other Ambulatory Visit (HOSPITAL_COMMUNITY)
Admission: RE | Admit: 2023-03-26 | Discharge: 2023-03-26 | Disposition: A | Discharge status: Home / Self Care | Payer: Medicaid Other | Source: Ambulatory Visit | Attending: Obstetrics and Gynecology | Admitting: Obstetrics and Gynecology

## 2023-03-26 ENCOUNTER — Ambulatory Visit: Payer: Medicaid Other

## 2023-03-26 DIAGNOSIS — N898 Other specified noninflammatory disorders of vagina: Secondary | ICD-10-CM | POA: Diagnosis not present

## 2023-03-26 DIAGNOSIS — E569 Vitamin deficiency, unspecified: Secondary | ICD-10-CM

## 2023-03-26 MED ORDER — VITAFOL ULTRA 29-0.6-0.4-200 MG PO CAPS
1.0000 | ORAL_CAPSULE | Freq: Every day | ORAL | 11 refills | Status: DC
Start: 2023-03-26 — End: 2024-02-17

## 2023-03-26 NOTE — Progress Notes (Signed)
SUBJECTIVE:  52 y.o. female complains of clear vaginal discharge, but vaginal irritation and itching for 1 week(s).  Denies abnormal vaginal bleeding or significant pelvic pain or fever. No UTI symptoms. Denies history of known exposure to STD.  Patient's last menstrual period was 07/23/2014.  OBJECTIVE:  She appears well, afebrile. Urine dipstick: not done.  ASSESSMENT:  Vaginal Discharge  Vaginal Odor   PLAN:  GC, chlamydia, trichomonas, BVAG, CVAG probe sent to lab. Treatment: To be determined once lab results are received ROV prn if symptoms persist or worsen.

## 2023-03-29 LAB — CERVICOVAGINAL ANCILLARY ONLY
Bacterial Vaginitis (gardnerella): NEGATIVE
Candida Glabrata: NEGATIVE
Candida Vaginitis: NEGATIVE
Chlamydia: NEGATIVE
Comment: NEGATIVE
Comment: NEGATIVE
Comment: NEGATIVE
Comment: NEGATIVE
Comment: NEGATIVE
Comment: NORMAL
Neisseria Gonorrhea: NEGATIVE
Trichomonas: NEGATIVE

## 2023-04-06 NOTE — Progress Notes (Unsigned)
04/07/2023 [name] [id number] 10-16-1970  Referring provider: Vonna Drafts, MD Primary GI doctor: Dr. Adela Lank  ASSESSMENT AND PLAN:   Abdominal Distension and Discomfort Patient reports a week-long episode of abdominal distension, discomfort, and excessive gas. Bowel movements occur every 2-3 days despite Linzess 290mg  daily. No evidence of obstruction on physical exam. -Order CT abdomen without contrast to rule out hernia or other structural abnormalities. -Consider gastroparesis given symptoms and poorly controlled diabetes; provide patient with gastroparesis diet information, schedule GES -Trial of Motegrity if approved by insurance for constipation as well as gastroparesis -Flagyl for 10 days to address excessive gas production.  GERD with dysphagia to solids foods, nausea and vomiting Patient reports nausea and vomiting, particularly with certain foods. History of esophageal dilation in the past with some esophageal dysphagia. -Plan for endoscopy once pulmonary status is optimized. - continue protonix daily -Consider gastroparesis given symptoms and poorly controlled diabetes; provide patient with gastroparesis diet information, schedule GES - will not offer reglan with history of QT and VT -Trial of Motegrity if approved by insurance for constipation as well as gastroparesis  Worsening Shortness of Breath Patient reports worsening shortness of breath with exertion and cough x 3-6 months. History of pulmonary and hepatic sarcoidosis. Some SOB with getting onto the table. -Refer to pulmonologist for evaluation before any sedation for endoscopy. -Refer to cardiologist for evaluation.  Poorly Controlled Diabetes Last A1c was 11. Patient reports blood glucose levels frequently in the high 200s. -Encourage tighter glucose control to improve potential gastroparesis symptoms.  Patient Care Team: Vonna Drafts, MD as PCP - General (Family Medicine) Marinus Maw,  MD as PCP - Cardiology (Cardiology) Marinus Maw, MD as PCP - Electrophysiology (Cardiology) Van Clines, MD as Consulting Physician (Neurology)  HISTORY OF PRESENT ILLNESS: 52 y.o. female with a past medical history of hepatic and pulmonary sarcoidosis, prolonged QT syndrome status post ICD, SVT status post ablation, history of CVA, seizures, DM, IBS with constipation, and fibromyalgia and others listed below presents for evaluation of bulge in AB.   02/18/2021 colonoscopy for personal history of colon polyps, good bowel prep showed 1 diminutive polyp cecum, 4 mm polyp ascending colon, 2 diminutive polyps ascending colon, internal hemorrhoids.  Pathology showed 2 of the 4 polyps precancerous recall 02/2026 01/2021 CT abdomen pelvis without contrast for left lower quadrant abdominal pain unremarkable 05/25/2022 office visit with [name] is here PA for nausea, vomiting and dysphagia.  Doing well on Linzess 290 mcg for constipation.  Patient was supposed to be set up for gastric emptying study and EGD however I do not see if this was ever done.  Discussed the use of AI scribe software for clinical note transcription with the patient, who gave verbal consent to proceed.  The patient, a known diabetic with a history of sarcoidosis and a partial hysterectomy, presented with complaints of abdominal distension, resembling a pregnant state, which had been occurring for approximately one to two weeks. The patient described the sensation as feeling extremely full and bloated, with increased gas production, despite passing gas regularly. Bowel movements were reported to be less frequent, occurring approximately three times a week, despite the use of Linzess.  The patient also reported associated nausea and vomiting, with a decreased appetite. Even simple foods such as salads, raw onions, and dressings were reported to induce vomiting or a persistent feeling of nausea. The patient also reported difficulty  swallowing certain foods, such as cucumbers and lettuce, which felt as though  they were getting stuck in the esophagus. The patient also reported taking Aleve or ibuprofen two to three times a week for pain management.  The patient's surgical history includes a partial hysterectomy, during which the patient experienced complications including hemorrhagic shock and had to have another exploratory lap after the procedure.   The patient's diabetes control was reported to be suboptimal, with blood glucose levels occasionally reaching the 300s. The patient's most recent A1C was reported to be 11. The patient also reported a recent weight gain, increasing from 168 lbs to approximately 170 lbs.  The patient also reported worsening shortness of breath, which was exacerbated by exertion such as getting up from a chair or climbing stairs. The patient denied experiencing palpitations but reported occasional coughing. The patient's last pulmonary consultation was approximately a year ago.   Lab Results  Component Value Date   HGBA1C 11.0 (A) 10/30/2022    She  reports that she has never smoked. She has never used smokeless tobacco. She reports current alcohol use. She reports that she does not use drugs.  RELEVANT LABS AND IMAGING:  Results   LABS Hb: 6 g/dL (4540) J8J: 19% (14/7829)      CBC    Component Value Date/Time   WBC 5.5 02/04/2021 1241   WBC 9.1 07/13/2020 0112   RBC 4.34 02/04/2021 1241   RBC 4.78 07/13/2020 0112   HGB 13.3 03/15/2023 0314   HGB 13.2 02/04/2021 1241   HCT 39.0 03/15/2023 0314   HCT 37.6 02/04/2021 1241   PLT 239 02/04/2021 1241   MCV 87 02/04/2021 1241   MCH 30.4 02/04/2021 1241   MCH 29.3 07/13/2020 0112   MCHC 35.1 02/04/2021 1241   MCHC 33.9 07/13/2020 0112   RDW 11.4 (L) 02/04/2021 1241   LYMPHSABS 3.2 06/29/2019 2052   MONOABS 0.6 06/29/2019 2052   EOSABS 0.1 06/29/2019 2052   BASOSABS 0.1 06/29/2019 2052   Recent Labs    03/15/23 0314  HGB  13.3    CMP     Component Value Date/Time   NA 138 03/15/2023 0314   NA 136 08/17/2022 1230   K 3.6 03/15/2023 0314   CL 103 03/15/2023 0314   CO2 24 08/17/2022 1230   GLUCOSE 235 (H) 03/15/2023 0314   BUN 12 03/15/2023 0314   BUN 7 08/17/2022 1230   CREATININE 0.50 03/15/2023 0314   CREATININE 0.41 (L) 03/28/2015 0848   CALCIUM 9.3 08/17/2022 1230   PROT 7.8 01/09/2021 1209   ALBUMIN 4.0 01/09/2021 1209   AST 21 01/09/2021 1209   ALT 22 01/09/2021 1209   ALKPHOS 105 01/09/2021 1209   BILITOT 0.6 01/09/2021 1209   GFRNONAA >60 07/13/2020 0112   GFRAA >60 06/30/2019 0404      Latest Ref Rng & Units 01/09/2021   12:09 PM 06/30/2019    4:04 AM 06/29/2019    8:52 PM  Hepatic Function  Total Protein 6.0 - 8.3 g/dL 7.8  7.6  7.3   Albumin 3.5 - 5.2 g/dL 4.0  3.6  3.6   AST 0 - 37 U/L 21  30  24    ALT 0 - 35 U/L 22  23  18    Alk Phosphatase 39 - 117 U/L 105  95  82   Total Bilirubin 0.2 - 1.2 mg/dL 0.6  0.9  0.9   Bilirubin, Direct 0.0 - 0.3 mg/dL 0.1         Latest Ref Rng & Units 06/17/2015   11:45 AM  Hepatitis C  AFP <6.1 ng/mL 1.9     Current Medications:   Current Outpatient Medications (Endocrine & Metabolic):    insulin degludec (TRESIBA FLEXTOUCH) 200 UNIT/ML FlexTouch Pen, Inject 40 Units into the skin daily.   metFORMIN (GLUCOPHAGE-XR) 500 MG 24 hr tablet, Take 2 tablets (1,000 mg total) by mouth 2 (two) times daily.   NOVOLOG FLEXPEN 100 UNIT/ML FlexPen, Inject 15 Units into the skin 2 (two) times daily before lunch and supper.  Current Outpatient Medications (Cardiovascular):    atorvastatin (LIPITOR) 10 MG tablet, Take 1 tablet (10 mg total) by mouth daily.   propranolol (INDERAL) 80 MG tablet, Take 1 tablet (80 mg total) by mouth 2 (two) times daily.   valsartan (DIOVAN) 80 MG tablet, Take 1 tablet (80 mg total) by mouth at bedtime.  Current Outpatient Medications (Respiratory):    azelastine (ASTELIN) 0.1 % nasal spray, Place 2 sprays into both  nostrils 2 (two) times daily. Use in each nostril as directed   fluticasone (FLONASE) 50 MCG/ACT nasal spray, Place 2 sprays into both nostrils daily as needed for allergies.    fluticasone (FLOVENT HFA) 110 MCG/ACT inhaler, Inhale 2 puffs into the lungs 2 (two) times daily.  Current Outpatient Medications (Analgesics):    acetaminophen (TYLENOL 8 HOUR) 650 MG CR tablet, Take 1 tablet (650 mg total) by mouth every 8 (eight) hours as needed for pain.   acetaminophen (TYLENOL) 500 MG tablet, Take 1,000 mg by mouth every 6 (six) hours as needed for mild pain or fever. Reported on 07/22/2015   aspirin EC 81 MG tablet, Take 1 tablet (81 mg total) by mouth daily.   Current Outpatient Medications (Other):    Accu-Chek Softclix Lancets lancets, Use as instructed   Blood Pressure Monitoring (CARETOUCH BP ARM MONITOR) DEVI, Use as needed to check blood pressure.   Camphor-Menthol-Methyl Sal (SALONPAS) 3.05-23-08 % PTCH, Apply 1 patch topically daily as needed (pain).   cycloSPORINE (RESTASIS) 0.05 % ophthalmic emulsion, Place 1 drop into both eyes daily.   glucose blood (ACCU-CHEK GUIDE) test strip, Check sugars four times daily   Insulin Pen Needle (B-D ULTRAFINE III SHORT PEN) 31G X 8 MM MISC, USE 6 TIMES DAILY   Insulin Pen Needle (NOVOFINE) 30G X 8 MM MISC, Inject 10 each into the skin as needed.   ketotifen (ZADITOR) 0.025 % ophthalmic solution, 1 drop 2 (two) times daily.   lamoTRIgine (LAMICTAL) 150 MG tablet, Take 1 tablet (150 mg total) by mouth 2 (two) times daily.   Lancet Devices (ACCU-CHEK SOFTCLIX) lancets, Use as instructed   linaclotide (LINZESS) 290 MCG CAPS capsule, Take 1 capsule (290 mcg total) by mouth daily before breakfast.   metroNIDAZOLE (FLAGYL) 250 MG tablet, Take 1 tablet (250 mg total) by mouth 3 (three) times daily for 10 days.   pantoprazole (PROTONIX) 40 MG tablet, Take 1 tablet (40 mg total) by mouth daily.   Prenat-Fe Poly-Methfol-FA-DHA (VITAFOL ULTRA) 29-0.6-0.4-200 MG  CAPS, Take 1 capsule by mouth daily before breakfast.   Prucalopride Succinate (MOTEGRITY) 2 MG TABS, Take 1 tablet (2 mg total) by mouth daily.   Continuous Blood Gluc Sensor (FREESTYLE LIBRE 3 SENSOR) MISC, Place 1 sensor on the skin every 14 days. Use to check glucose continuously (Patient not taking: Reported on 04/07/2023)   gabapentin (NEURONTIN) 300 MG capsule, Take 1 capsule (300 mg total) by mouth 3 (three) times daily.  Medical History:  Past Medical History:  Diagnosis Date   Abdominal pain, periumbilic  Allergic rhinitis    Allergy    Anemia    Anxiety    Arthritis    Asthma    Automatic implantable cardioverter-defibrillator in situ 2006, replaced 2008   [employer]   Bronchitis    Cancer (HCC)    Nodules in lungs pt believes were cancerous pt unsure   Constipation 02/08/2008   Qualifier: Diagnosis of   By: Arlyce Dice MD, Barbette Hair      COPD (chronic obstructive pulmonary disease) (HCC)    DM (diabetes mellitus) (HCC) 1997/1998   type 1, initially gestational but soon after child birth developed IDDM   Esophageal stricture 2005/2009   esophageal strictures dilated 2005, 2009   Eye hemorrhage    bilateral   Fibroid, uterine    Fibromyalgia    GERD (gastroesophageal reflux disease) 2005   History of blood transfusion years ago   Hypertension    Internal hemorrhoids 2010   2011 band ligation of int rrhoids.   Long Q-T syndrome    Lupus    questionable   Neuropathy in diabetes [facility])    Palpitation    Sarcoidosis    Seizure disorder (HCC)    mid June 2022 last seizure pt reported   Stroke [facility])    QUESTIONABLE TIA .  Jun 29, 2019   Allergies:  Allergies  Allergen Reactions   Contrast Media [Iodinated Contrast Media] Anaphylaxis and Shortness Of Breath    Needed to be defibrillated and patient was pre-medicated with radiology 13 hour premeds.    Iohexol Anaphylaxis     Code: RASH, Desc: PT STATES SHE IS ALLERGIC TO IV CONTRAST 05/28/06/RM, Onset Date: [id number]     Augmentin [Amoxicillin-Pot Clavulanate] Other (See Comments)    "stomach hurt"   Codeine Nausea And Vomiting    jittery   Demerol [Meperidine] Other (See Comments)    dysphoria   Morphine And Codeine Nausea And Vomiting   Lantus [Insulin Glargine] Other (See Comments)    Patient reports itching     Surgical History:  She  has a past surgical history that includes Tubal ligation; Hand surgery (Right); SVT ablation; AUTOMATIC IMPLANTABLE CARDIAC DEFIBRILLATOR SITU (2006); Hemorrhoid banding (03/2010); Dilatation & currettage/hysteroscopy with hydrothermal ablation (N/A, 06/07/2013); Esophagogastroduodenoscopy (egd) with propofol (N/A, 10/24/2014); Total abdominal hysterectomy w/ bilateral salpingoophorectomy (11/06/2014); Exploratory laparotomy (11/06/2014); Salpingectomy; Cervix removal; Video bronchoscopy (Bilateral, 01/24/2015); and Cardiac catheterization (N/A, 04/04/2015). Family History:  Her family history includes Aneurysm in her mother; Breast cancer in her maternal aunt; Colon cancer in her maternal grandfather; Diabetes in her father; Esophageal cancer in her paternal aunt; Glaucoma in her father; Heart disease in her mother; Lung cancer in her maternal uncle; Positive PPD/TB Exposure in her father; Stomach cancer in her paternal grandfather, paternal grandmother, and paternal uncle.  REVIEW OF SYSTEMS  : All other systems reviewed and negative except where noted in the History of Present Illness.  PHYSICAL EXAM: BP 138/84 (BP Location: Left Arm, Patient Position: Sitting)   Pulse 78   Ht 5\' 6"  (1.676 m)   Wt 181 lb 8 oz (82.3 kg)   LMP 07/23/2014   SpO2 93%   BMI 29.29 kg/m  General Appearance: Well nourished, in no apparent distress. Head:   Normocephalic and atraumatic. Eyes:  sclerae anicteric,conjunctive pink  Respiratory: Respiratory effort normal, BS equal bilaterally without rales, rhonchi, wheezing. Cardio: RRR with no MRGs. Peripheral pulses intact.  Abdomen:  Soft,  Obese ,sluggish bowel sounds. mild tenderness in the entire abdomen. 2 finger diastasis Without guarding and  Without rebound. No masses. Rectal: Not evaluated Musculoskeletal: Full ROM, Normal gait. Without edema. Skin:  Dry and intact without significant lesions or rashes Neuro: Alert and  oriented x4;  No focal deficits. Psych:  Cooperative. Normal mood and affect.    Doree Albee, PA-C 2:29 PM

## 2023-04-07 ENCOUNTER — Telehealth: Payer: Self-pay | Admitting: Physician Assistant

## 2023-04-07 ENCOUNTER — Ambulatory Visit: Payer: Medicaid Other | Admitting: Physician Assistant

## 2023-04-07 ENCOUNTER — Encounter: Payer: Self-pay | Admitting: Physician Assistant

## 2023-04-07 ENCOUNTER — Other Ambulatory Visit (INDEPENDENT_AMBULATORY_CARE_PROVIDER_SITE_OTHER): Payer: Medicaid Other

## 2023-04-07 VITALS — BP 138/84 | HR 78 | Ht 66.0 in | Wt 181.5 lb

## 2023-04-07 DIAGNOSIS — J449 Chronic obstructive pulmonary disease, unspecified: Secondary | ICD-10-CM

## 2023-04-07 DIAGNOSIS — R112 Nausea with vomiting, unspecified: Secondary | ICD-10-CM

## 2023-04-07 DIAGNOSIS — Z8709 Personal history of other diseases of the respiratory system: Secondary | ICD-10-CM | POA: Diagnosis not present

## 2023-04-07 DIAGNOSIS — K581 Irritable bowel syndrome with constipation: Secondary | ICD-10-CM

## 2023-04-07 DIAGNOSIS — D860 Sarcoidosis of lung: Secondary | ICD-10-CM

## 2023-04-07 DIAGNOSIS — Z794 Long term (current) use of insulin: Secondary | ICD-10-CM

## 2023-04-07 DIAGNOSIS — D862 Sarcoidosis of lung with sarcoidosis of lymph nodes: Secondary | ICD-10-CM

## 2023-04-07 DIAGNOSIS — E1165 Type 2 diabetes mellitus with hyperglycemia: Secondary | ICD-10-CM | POA: Diagnosis not present

## 2023-04-07 DIAGNOSIS — R059 Cough, unspecified: Secondary | ICD-10-CM

## 2023-04-07 DIAGNOSIS — R140 Abdominal distension (gaseous): Secondary | ICD-10-CM | POA: Diagnosis not present

## 2023-04-07 DIAGNOSIS — R1310 Dysphagia, unspecified: Secondary | ICD-10-CM

## 2023-04-07 DIAGNOSIS — R0602 Shortness of breath: Secondary | ICD-10-CM

## 2023-04-07 LAB — CBC WITH DIFFERENTIAL/PLATELET
Basophils Absolute: 0.1 10*3/uL (ref 0.0–0.1)
Basophils Relative: 1.9 % (ref 0.0–3.0)
Eosinophils Absolute: 0.1 10*3/uL (ref 0.0–0.7)
Eosinophils Relative: 2.5 % (ref 0.0–5.0)
HCT: 42.6 % (ref 36.0–46.0)
Hemoglobin: 14.7 g/dL (ref 12.0–15.0)
Lymphocytes Relative: 40.6 % (ref 12.0–46.0)
Lymphs Abs: 2.3 10*3/uL (ref 0.7–4.0)
MCHC: 34.5 g/dL (ref 30.0–36.0)
MCV: 87 fL (ref 78.0–100.0)
Monocytes Absolute: 0.4 10*3/uL (ref 0.1–1.0)
Monocytes Relative: 6.3 % (ref 3.0–12.0)
Neutro Abs: 2.7 10*3/uL (ref 1.4–7.7)
Neutrophils Relative %: 48.7 % (ref 43.0–77.0)
Platelets: 238 10*3/uL (ref 150.0–400.0)
RBC: 4.89 Mil/uL (ref 3.87–5.11)
RDW: 12.5 % (ref 11.5–15.5)
WBC: 5.6 10*3/uL (ref 4.0–10.5)

## 2023-04-07 LAB — COMPREHENSIVE METABOLIC PANEL
ALT: 16 U/L (ref 0–35)
AST: 17 U/L (ref 0–37)
Albumin: 4.4 g/dL (ref 3.5–5.2)
Alkaline Phosphatase: 109 U/L (ref 39–117)
BUN: 8 mg/dL (ref 6–23)
CO2: 29 meq/L (ref 19–32)
Calcium: 9.3 mg/dL (ref 8.4–10.5)
Chloride: 99 meq/L (ref 96–112)
Creatinine, Ser: 0.59 mg/dL (ref 0.40–1.20)
GFR: 104.31 mL/min (ref >60.00)
Glucose, Bld: 300 mg/dL — ABNORMAL HIGH (ref 70–99)
Potassium: 3.8 meq/L (ref 3.5–5.1)
Sodium: 136 meq/L (ref 135–145)
Total Bilirubin: 0.7 mg/dL (ref 0.2–1.2)
Total Protein: 7.5 g/dL (ref 6.0–8.3)

## 2023-04-07 LAB — TSH: TSH: 1.33 u[IU]/mL (ref 0.35–5.50)

## 2023-04-07 LAB — SEDIMENTATION RATE: Sed Rate: 17 mm/h (ref 0–30)

## 2023-04-07 MED ORDER — METRONIDAZOLE 250 MG PO TABS: 250.0000 mg | ORAL_TABLET | Freq: Three times a day (TID) | ORAL | 0 refills | Status: AC

## 2023-04-07 MED ORDER — MOTEGRITY 2 MG PO TABS
2.0000 mg | ORAL_TABLET | Freq: Every day | ORAL | 2 refills | Status: DC
Start: 2023-04-07 — End: 2023-05-13

## 2023-04-07 NOTE — Progress Notes (Signed)
Agree with assessment and plan as outlined.  

## 2023-04-07 NOTE — Patient Instructions (Signed)
Your provider has requested that you go to the basement level for lab work before leaving today. Press "B" on the elevator. The lab is located at the first door on the left as you exit the elevator.  You have been scheduled for a CT scan of the abdomen and pelvis at [facility], 1st floor Radiology. You are scheduled on 04/13/23 at 1:30 pm. You should arrive 15 minutes prior to your appointment time for registration. You may take any medications as prescribed with a small amount of water, if necessary. If you take any of the following medications: METFORMIN, GLUCOPHAGE, GLUCOVANCE, AVANDAMET, RIOMET, FORTAMET, ACTOPLUS MET, JANUMET, GLUMETZA or METAGLIP, you MAY be asked to HOLD this medication 48 hours AFTER the exam.   The purpose of you drinking the oral contrast is to aid in the visualization of your intestinal tract. The contrast solution may cause some diarrhea. Depending on your individual set of symptoms, you may also receive an intravenous injection of x-ray contrast/dye. Plan on being at [facility] for 45 minutes or longer, depending on the type of exam you are having performed.   If you have any questions regarding your exam or if you need to reschedule, you may call [facility] at [phone] between the hours of 8:00 am and 5:00 pm, Monday-Friday.   You have been scheduled for a gastric emptying scan at [facility] on 04/19/23 at 8 am. Please arrive at least 7:45 pm prior to your appointment for registration. Please make certain not to have anything to eat or drink after midnight the night before your test. Hold all stomach medications (ex: Zofran, phenergan, Reglan) 24 hours prior to your test. If you need to reschedule your appointment, please contact radiology scheduling at [phone]. _____________________________________________________________________ A gastric-emptying study measures how long it takes for food to move through your stomach. There are  several ways to measure stomach emptying. In the most common test, you eat food that contains a small amount of radioactive material. A scanner that detects the movement of the radioactive material is placed over your abdomen to monitor the rate at which food leaves your stomach. This test normally takes about 4 hours to complete.    Dysphagia precautions:  1. Take reflux medications, protonix- 30+ minutes before food in the morning 2. Begin meals with warm beverage 3. Eat smaller more frequent meals 4. Eat slowly, taking small bites and sips 5. Alternate solids and liquids 6. Avoid foods/liquids that increase acid production 7. Sit upright during and for 30+ minutes after meals to facilitate esophageal clearing 8. All meats should be chopped finely.   If something gets hung in your esophagus and will not come up or go down, proceed to the emergency room.    Gastroparesis Please do small frequent meals like 4-6 meals a day.  Eat and drink liquids at separate times.  Avoid high fiber foods, cook your vegetables, avoid high fat food.  Suggest spreading protein throughout the day (greek yogurt, glucerna, soft meat, milk, eggs) Choose soft foods that you can mash with a fork When you are more symptomatic, change to pureed foods foods and liquids.  Consider reading "Living well with Gastroparesis" by Reuel Derby Gastroparesis is a condition in which food takes longer than normal to empty from the stomach. This condition is also known as delayed gastric emptying. It is usually a long-term (chronic) condition. There is no cure, but there are treatments and things that you can do at home to  help relieve symptoms. Treating the underlying condition that causes gastroparesis can also help relieve symptoms What are the causes? In many cases, the cause of this condition is not known. Possible causes include: A hormone (endocrine) disorder, such as hypothyroidism or diabetes. A nervous system  disease, such as Parkinson's disease or multiple sclerosis. Cancer, infection, or surgery that affects the stomach or vagus nerve. The vagus nerve runs from your chest, through your neck, and to the lower part of your brain. A connective tissue disorder, such as scleroderma. Certain medicines. What increases the risk? You are more likely to develop this condition if: You have certain disorders or diseases. These may include: An endocrine disorder. An eating disorder. Amyloidosis. Scleroderma. Parkinson's disease. Multiple sclerosis. Cancer or infection of the stomach or the vagus nerve. You have had surgery on your stomach or vagus nerve. You take certain medicines. You are female. What are the signs or symptoms? Symptoms of this condition include: Feeling full after eating very little or a loss of appetite. Nausea, vomiting, or heartburn. Bloating of your abdomen. Inconsistent blood sugar (glucose) levels on blood tests. Unexplained weight loss. Acid from the stomach coming up into the esophagus (gastroesophageal reflux). Sudden tightening (spasm) of the stomach, which can be painful. Symptoms may come and go. Some people may not notice any symptoms. How is this diagnosed? This condition is diagnosed with tests, such as: Tests that check how long it takes food to move through the stomach and intestines. These tests include: Upper gastrointestinal (GI) series. For this test, you drink a liquid that shows up well on X-rays, and then X-rays are taken of your intestines. Gastric emptying scintigraphy. For this test, you eat food that contains a small amount of radioactive material, and then scans are taken. Wireless capsule GI monitoring system. For this test, you swallow a pill (capsule) that records information about how foods and fluid move through your stomach. Gastric manometry. For this test, a tube is passed down your throat and into your stomach to measure electrical and  muscular activity. Endoscopy. For this test, a long, thin tube with a camera and light on the end is passed down your throat and into your stomach to check for problems in your stomach lining. Ultrasound. This test uses sound waves to create images of the inside of your body. This can help rule out gallbladder disease or pancreatitis as a cause of your symptoms. How is this treated? There is no cure for this condition, but treatment and home care may relieve symptoms. Treatment may include: Treating the underlying cause. Managing your symptoms by making changes to your diet and exercise habits. Taking medicines to control nausea and vomiting and to stimulate stomach muscles. Getting food through a feeding tube in the hospital. This may be done in severe cases. Having surgery to insert a device called a gastric electrical stimulator into your body. This device helps improve stomach emptying and control nausea and vomiting. Follow these instructions at home: Take over-the-counter and prescription medicines only as told by your health care provider. Follow instructions from your health care provider about eating or drinking restrictions. Your health care provider may recommend that you: Eat smaller meals more often. Eat low-fat foods. Eat low-fiber forms of high-fiber foods. For example, eat cooked vegetables instead of raw vegetables. Have only liquid foods instead of solid foods. Liquid foods are easier to digest. Drink enough fluid to keep your urine pale yellow. Exercise as often as told by your health care provider.  Keep all follow-up visits. This is important. Contact a health care provider if you: Notice that your symptoms do not improve with treatment. Have new symptoms. Get help right away if you: Have severe pain in your abdomen that does not improve with treatment. Have nausea that is severe or does not go away. Vomit every time you drink fluids. Summary Gastroparesis is a  long-term (chronic) condition in which food takes longer than normal to empty from the stomach. Symptoms include nausea, vomiting, heartburn, bloating of your abdomen, and loss of appetite. Eating smaller portions, low-fat foods, and low-fiber forms of high-fiber foods may help you manage your symptoms. Get help right away if you have severe pain in your abdomen. This information is not intended to replace advice given to you by your health care provider. Make sure you discuss any questions you have with your health care provider. Document Revised: 09/11/2019 Document Reviewed: 09/11/2019 Elsevier Patient Education  2021 [employer] Inc.  Diastasis Recti  Diastasis recti is a condition in which the muscles of the abdomen (rectus abdominis muscles) become thin and separate. The result is a wider space between the muscles of the right and left abdomen (abdominal muscles). This wider space between the muscles may cause a bulge in the middle of the abdomen. This bulge may be noticed when a person is straining or when he or she sits up after lying down. Diastasis recti can affect men and women. It is most common among pregnant women, babies, people with obesity, and people who have had abdominal surgery. Exercise or surgery may help correct this condition. What are the causes? Common causes of this condition include: Pregnancy. As the uterus grows in size, it puts pressure on the abdominal muscles, causing the muscles to separate. Obesity. Excess fat puts pressure on abdominal muscles. Weight lifting. Some exercises of the abdomen. Advanced age. Genetics. Having had surgery on the abdomen before. What increases the risk? This condition is more likely to develop in: Women. Newborns, especially newborns who are born early (prematurely). What are the signs or symptoms? Common symptoms of this condition include: A bulge in the middle of your abdomen. You will notice it most when you sit up or  strain. Pain in your low back, hips, or the area between your hip bones (pelvis). Constipation. Being unable to control when you urinate (urinary incontinence). Bloating. Poor posture. How is this diagnosed? This condition is diagnosed with a physical exam. During the exam, your health care provider will ask you to lie flat on your back and do a crunch or half sit-up. If you have diastasis recti, a bulge will appear lengthwise between your abdominal muscles in the center of your abdomen. Your health care provider will measure the gap between your muscles with one of the following: A medical device used to measure the space between two objects (caliper). A tape measure. CT scan. Ultrasound. Finger spaces. Your health care provider will measure the space using his or her fingers. How is this treated? If your muscle separation is not too large, you may not need treatment. However, if you are a woman who plans to become pregnant again, you should treat this condition before your next pregnancy. Treatment may include: Physical therapy exercises to strengthen and tighten your abdominal muscles. Lifestyle changes such as weight loss and exercise. Over-the-counter pain medicines as needed. Surgery to correct the separation. Follow these instructions at home: Activity Return to your normal activities as told by your health care provider. Ask your  health care provider what activities are safe for you. Do exercises as told by your health care provider. Make sure you are doing your exercises and movements correctly when lifting weights or doing exercises using your abdominal muscles or the muscles in the center of your body that give stability (core muscles). Proper form can help to prevent this condition from happening again. General instructions If you are overweight, ask your health care provider for help with weight loss. Losing even a small amount of weight can help to improve your diastasis  recti. Take over-the-counter or prescription medicines only as told by your health care provider. Do not strain. Straining can make the separation worse. Examples of straining include: Pushing hard to have a bowel movement, such as when you have constipation. Lifting heavy objects or lifting children. Standing up and sitting down. You may need to take these actions to prevent or treat constipation: Drink enough fluid to keep your urine pale yellow. Take over-the-counter or prescription medicines. Eat foods that are high in fiber, such as beans, whole grains, and fresh fruits and vegetables. Limit foods that are high in fat and processed sugars, such as fried or sweet foods. Keep all follow-up visits. This is important. Contact a health care provider if: You notice a new bulge in your abdomen. Get help right away if: You experience severe discomfort in your abdomen. You develop severe abdominal pain along with nausea, vomiting, or a fever. Summary Diastasis recti is a condition in which the muscles of the abdomen (rectus abdominismuscles) become thin and separate. You may notice a bulge in your abdomen because the space has widened between the muscles of the right and left abdomen. The most common symptom is a bulge in the middle of your abdomen. You will notice it most when you sit up or strain. This condition is diagnosed with a physical exam. If the muscle separation is not too big, you may not need treatment. Otherwise, you may need to do physical therapy or have surgery. This information is not intended to replace advice given to you by your health care provider. Make sure you discuss any questions you have with your health care provider. Document Revised: 01/05/2020 Document Reviewed: 01/05/2020 Elsevier Patient Education  2024 [employer].

## 2023-04-07 NOTE — Telephone Encounter (Signed)
Inbound call from patient she spoke with Cardiologist and was advised for the request for clearance for patient to have procedure would have to be faxed to [phone]. States she has not spoken with Pulmonary yet but the phone number is [phone]. Please advise, thank you.

## 2023-04-07 NOTE — Telephone Encounter (Signed)
Morrison Old this pt was seen today.

## 2023-04-12 ENCOUNTER — Telehealth: Payer: Self-pay

## 2023-04-12 NOTE — Telephone Encounter (Signed)
Hasn't been seen in our office in almost 2 years. Would need an OV for surgical risk assessment

## 2023-04-12 NOTE — Telephone Encounter (Signed)
[facility] Pre-operative Risk Assessment     Request for surgical clearance:     Colonoscopy/Endoscopy Procedure  What type of surgery is being performed?     Colonoscopy/Endoscopy  When is this surgery scheduled?     TBD  What type of clearance is required ?  Medical Clearance  Are there any medications that need to be held prior to surgery and how long? Medical Clearance -no meds  We would like your medical opinion on if patient is medically stable to have the above procedure done  Practice name and name of physician performing surgery?      [facility]  What is your office phone and fax number?      Phone- [phone]  Fax- [phone]  Anesthesia type (None, local, MAC, general) ?       MAC   Please route your response to [facility]

## 2023-04-12 NOTE — Telephone Encounter (Signed)
[facility] Pre-operative Risk Assessment     Request for surgical clearance:     Colonoscopy/Endoscopy Procedure  What type of surgery is being performed?     Colonoscopy/Endoscopy  When is this surgery scheduled?     TBD  What type of clearance is required ?  Medical Clearance  Are there any medications that need to be held prior to surgery and how long? Medical Clearance -no meds  We would like your medical opinion on if patient is medically stable to have the above procedure done  Practice name and name of physician performing surgery?      [facility]  What is your office phone and fax number?      Phone- [phone]  Fax- [phone]  Anesthesia type (None, local, MAC, general) ?       MAC   Please route your response to [facility]

## 2023-04-13 ENCOUNTER — Telehealth: Payer: Self-pay | Admitting: Physician Assistant

## 2023-04-13 ENCOUNTER — Ambulatory Visit (HOSPITAL_COMMUNITY): Payer: Medicaid Other

## 2023-04-13 NOTE — Telephone Encounter (Signed)
1st attempt to reach pt regarding surgical clearance and the need for an TELE appointment.  Left pt a detailed message to call back and get that scheduled.

## 2023-04-13 NOTE — Telephone Encounter (Signed)
Inbound call from patient, states she believes her Flagyl is causing a yeast infection patient would like medication for it sent in.

## 2023-04-13 NOTE — Telephone Encounter (Signed)
   Name: MAIRA DOBLE  DOB: 1970-07-11  MRN: [id number]  Primary Cardiologist: Lewayne Bunting, MD  Chart reviewed as part of pre-operative protocol coverage. Because of Alsie Wheatley Carfagno's past medical history and time since last visit, she will require a follow-up in-office visit in order to better assess preoperative cardiovascular risk.  Pre-op covering staff: - Please schedule appointment and call patient to inform them. If patient already had an upcoming appointment within acceptable timeframe, please add "pre-op clearance" to the appointment notes so provider is aware. - Please contact requesting surgeon's office via preferred method (i.e, phone, fax) to inform them of need for appointment prior to surgery.  No medications indicated as needing held.  Sharlene Dory, PA-C  04/13/2023, 8:00 AM

## 2023-04-14 MED ORDER — CLOTRIMAZOLE 2 % VA CREA: 1.0000 | TOPICAL_CREAM | Freq: Every day | VAGINAL | 0 refills | Status: DC

## 2023-04-14 NOTE — Telephone Encounter (Signed)
Due to patient's QT interval I do not send in Diflucan but I sent in vaginal suppository for clotrimazole for her to use.

## 2023-04-16 ENCOUNTER — Other Ambulatory Visit (HOSPITAL_COMMUNITY): Payer: Self-pay

## 2023-04-16 ENCOUNTER — Telehealth: Payer: Self-pay | Admitting: Pharmacy Technician

## 2023-04-16 NOTE — Telephone Encounter (Signed)
Pharmacy Patient Advocate Encounter   Received notification from CoverMyMeds that prior authorization for MOTEGRITY 2MG  is required/requested.   Insurance verification completed.   The patient is insured through [facility] .   Per test claim: PA required; PA submitted to above mentioned insurance via CoverMyMeds Key/confirmation #/EOC [id number] Status is pending

## 2023-04-16 NOTE — Telephone Encounter (Signed)
Pharmacy Patient Advocate Encounter  Received notification from [facility] that Prior Authorization for MOTEGRITY 2MG  has been APPROVED from 11.29.24 to 11.29.25. Ran test claim, Copay is $4. This test claim was processed through [facility]- copay amounts may vary at other pharmacies due to pharmacy/plan contracts, or as the patient moves through the different stages of their insurance plan.   PA #/Case ID/Reference #: [id number]

## 2023-04-19 ENCOUNTER — Other Ambulatory Visit (HOSPITAL_COMMUNITY): Payer: Medicaid Other

## 2023-04-19 NOTE — Telephone Encounter (Signed)
I will send a message to the EP schedulers to reach out the pt for an appt needed for pre op clearance.

## 2023-04-19 NOTE — Telephone Encounter (Signed)
Angeline Hammer EP scheduler called the pt's mother as pt phone did not have VM. EP scheduler called and s/w the pt's mother who said she will have the pt call the office back to schedule appt.

## 2023-04-21 NOTE — Telephone Encounter (Signed)
EP scheduler Angeline, yesterday tried to reach the pt to schedule an appt with EP APP for preop clearance, though left vm. The EP scheduler will try to reach the pt again today.   I will update the requesting office the pt needs an appt in office for pre op clearance.   EP scheduler stated just now VM full for the pt and they did leave a message on the pt's mother's phone for pt to call back and schedule appt.

## 2023-04-22 NOTE — Telephone Encounter (Signed)
I will update the requesting office that our office has tried to reach the pt x 3 to schedule an appt for pre op clearance. Pt needs to call back and ask to s/w Dr. Lubertha Basque scheduler to make appt for preop clearance.   I will remove from the preop call back pool.

## 2023-04-22 NOTE — Telephone Encounter (Signed)
Okay thanks for letting me know. She can't proceed with scheduling her procedures with [place] until she sees Dr. Ladona Ridgel. Thanks

## 2023-04-27 ENCOUNTER — Telehealth: Payer: Self-pay

## 2023-04-27 NOTE — Telephone Encounter (Signed)
Called patient regarding medical clearance no answer unable to leave voice message patient mail box is full. Patient will not be able to do procedure without clearance from cardiology.

## 2023-04-29 ENCOUNTER — Telehealth: Payer: Self-pay | Admitting: Internal Medicine

## 2023-04-29 ENCOUNTER — Telehealth: Payer: Self-pay | Admitting: *Deleted

## 2023-04-29 NOTE — Telephone Encounter (Signed)
Hammer, Angeline S1 hour ago (11:26 AM)   AH   Pt called and scheduled appt with Renee on 05/13/23 at 11:20 am for preop clearance       Note   Halston, Noreen [phone]  Noe Gens    I will update requesting provider pt has appt 05/13/23 with Francis Dowse, PAC.

## 2023-04-29 NOTE — Telephone Encounter (Signed)
  Pt called and scheduled appt with Renee on 05/13/23 at 11:20 am for preop clearance

## 2023-04-29 NOTE — Telephone Encounter (Signed)
Hammer, Loretta S1 hour ago (11:26 AM)   AH   Pt called and scheduled appt with Renee on 05/13/23 at 11:20 am for preop clearance       Note   [name], [name] [phone]  Hammer, [name]    See clearance notes

## 2023-05-10 NOTE — Progress Notes (Unsigned)
Cardiology Office Note:  .   Date:  05/10/2023  ID:  TAHNI KETH, DOB 20-Dec-1970, MRN [id number] PCP: Vonna Drafts, MD  [facility] Providers Cardiologist:  Lewayne Bunting, MD Electrophysiologist:  Lewayne Bunting, MD {  History of Present Illness: .   [name] is a 52 y.o. 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 Ladona Ridgel 08/13/22, had put some weight on, particularly more sedentary with a knee injury Discussed MMVT more c/w her sarcoid then her long QT, device functioning properly Chronically low R waves, appropriate sensing of the VT episode Propranolol continued  Pending colonoscopy/endoscopy, not yet scheduled  Today's visit is scheduled for pre-op clearance  ROS:   She reports a few symptoms today though reports them all as chronic for years  Dizziness, this is not near syncope or feeling faint, more a sense of movement/feeling off balance.  Waxes/wanes, no clear trigger, pattern.  These are most days, intermittent through the day, no falls DOE.  Again chronic but worse of late, she reports plans to see her pulmonologist (with known pulm sarcoid) no rest, positional or nocturnal SOB, though in the last months taking less to feel winded CP, again chronic and definitely associated with also chest wall tenderness, even feels a knot under her skin, I R sided and radiates central  No ICD shocks, no syncope  Reports EGD/colonoscopy planned for GI issues, bloating, pain, suspected/diagnosed with gastroparesis   Device information MDT single chamber ICD implanted 01/13/2007, gen change 04/04/2015  Arrhythmia/AAD hx July 2016 syncope w/head trauma > torsades   Studies Reviewed: Marland Kitchen    EKG done today and reviewed by myself:  SR 71bpm, QTc  DEVICE interrogation done today and reviewed by myself Battery and lead measurements  are good One NSVT back in June   01/28/2015: TTE - Left ventricle: The cavity size was normal. Systolic function was    normal. The estimated ejection fraction was in the range of 60%    to 65%. Wall motion was normal; there were no regional wall    motion abnormalities. Left ventricular diastolic function    parameters were normal.  - Mitral valve: There was mild regurgitation.  - Left atrium: The atrium was mildly dilated.  - Pulmonary arteries: Systolic pressure was mildly increased. PA    peak pressure: 36 mm Hg (S).    Risk Assessment/Calculations:    Physical Exam:   VS:  LMP 07/23/2014    Wt Readings from Last 3 Encounters:  04/07/23 181 lb 8 oz (82.3 kg)  03/15/23 178 lb (80.7 kg)  12/07/22 179 lb (81.2 kg)    GEN: Well nourished, well developed in no acute distress NECK: No JVD; No carotid bruits CARDIAC: RRR, no murmurs, rubs, gallops RESPIRATORY:  CTA b/l without rales, wheezing or rhonchi  ABDOMEN: Soft, non-tender, non-distended EXTREMITIES: No edema; No deformity   ICD site: is stable, no thinning, fluctuation, tethering  ASSESSMENT AND PLAN: .    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

## 2023-05-13 ENCOUNTER — Encounter: Payer: Medicaid Other | Admitting: Pulmonary Disease

## 2023-05-13 ENCOUNTER — Ambulatory Visit: Payer: Medicaid Other | Attending: Physician Assistant | Admitting: Physician Assistant

## 2023-05-13 ENCOUNTER — Encounter: Payer: Self-pay | Admitting: Physician Assistant

## 2023-05-13 VITALS — BP 128/88 | HR 71 | Ht 66.5 in | Wt 179.0 lb

## 2023-05-13 DIAGNOSIS — R0609 Other forms of dyspnea: Secondary | ICD-10-CM | POA: Diagnosis not present

## 2023-05-13 DIAGNOSIS — I472 Ventricular tachycardia, unspecified: Secondary | ICD-10-CM

## 2023-05-13 DIAGNOSIS — I4581 Long QT syndrome: Secondary | ICD-10-CM | POA: Diagnosis not present

## 2023-05-13 DIAGNOSIS — Z95810 Presence of automatic (implantable) cardiac defibrillator: Secondary | ICD-10-CM | POA: Diagnosis not present

## 2023-05-13 DIAGNOSIS — I4721 Torsades de pointes: Secondary | ICD-10-CM | POA: Diagnosis not present

## 2023-05-13 DIAGNOSIS — Z01818 Encounter for other preprocedural examination: Secondary | ICD-10-CM | POA: Diagnosis not present

## 2023-05-13 DIAGNOSIS — R0602 Shortness of breath: Secondary | ICD-10-CM | POA: Diagnosis not present

## 2023-05-13 LAB — CUP PACEART INCLINIC DEVICE CHECK
Battery Remaining Longevity: 44 mo
Battery Voltage: 2.97 V
Brady Statistic RV Percent Paced: 0 %
Date Time Interrogation Session: 20241226181311
HighPow Impedance: 55 Ohm
HighPow Impedance: 89 Ohm
Implantable Lead Connection Status: 753985
Implantable Lead Implant Date: 20080828
Implantable Lead Location: 753860
Implantable Lead Model: 6947
Implantable Pulse Generator Implant Date: 20161117
Lead Channel Impedance Value: 342 Ohm
Lead Channel Impedance Value: 399 Ohm
Lead Channel Pacing Threshold Amplitude: 1.375 V
Lead Channel Pacing Threshold Pulse Width: 0.4 ms
Lead Channel Sensing Intrinsic Amplitude: 4.25 mV
Lead Channel Sensing Intrinsic Amplitude: 4.75 mV
Lead Channel Setting Pacing Amplitude: 3 V
Lead Channel Setting Pacing Pulse Width: 0.4 ms
Lead Channel Setting Sensing Sensitivity: 0.3 mV
Zone Setting Status: 755011

## 2023-05-13 MED ORDER — PROPRANOLOL HCL 80 MG PO TABS: 80.0000 mg | ORAL_TABLET | Freq: Two times a day (BID) | ORAL | 3 refills | Status: AC

## 2023-05-13 NOTE — Patient Instructions (Signed)
Medication Instructions:   Your physician recommends that you continue on your current medications as directed. Please refer to the Current Medication list given to you today.   *If you need a refill on your cardiac medications before your next appointment, please call your pharmacy*   Lab Work: NONE ORDERED  TODAY    If you have labs (blood work) drawn today and your tests are completely normal, you will receive your results only by: MyChart Message (if you have MyChart) OR A paper copy in the mail If you have any lab test that is abnormal or we need to change your treatment, we will call you to review the results.   Testing/Procedures:   NEXT ASAP AVAILABLE Your physician has requested that you have an echocardiogram. Echocardiography is a painless test that uses sound waves to create images of your heart. It provides your doctor with information about the size and shape of your heart and how well your heart's chambers and valves are working. This procedure takes approximately one hour. There are no restrictions for this procedure. Please do NOT wear cologne, perfume, aftershave, or lotions (deodorant is allowed). Please arrive 15 minutes prior to your appointment time.  Please note: We ask at that you not bring children with you during ultrasound (echo/ vascular) testing. Due to room size and safety concerns, children are not allowed in the ultrasound rooms during exams. Our front office staff cannot provide observation of children in our lobby area while testing is being conducted. An adult accompanying a patient to their appointment will only be allowed in the ultrasound room at the discretion of the ultrasound technician under special circumstances. We apologize for any inconvenience.      Follow-Up: At [facility], you and your health needs are our priority.  As part of our continuing mission to provide you with exceptional heart care, we have created designated Provider  Care Teams.  These Care Teams include your primary Cardiologist (physician) and Advanced Practice Providers (APPs -  Physician Assistants and Nurse Practitioners) who all work together to provide you with the care you need, when you need it.  We recommend signing up for the patient portal called "MyChart".  Sign up information is provided on this After Visit Summary.  MyChart is used to connect with patients for Virtual Visits (Telemedicine).  Patients are able to view lab/test results, encounter notes, upcoming appointments, etc.  Non-urgent messages can be sent to your provider as well.   To learn more about what you can do with MyChart, go to [email or web address].    Your next appointment:   6 month(s)  Provider:   Lewayne Bunting, MD or Francis Dowse, PA-C    Other Instructions

## 2023-05-18 ENCOUNTER — Ambulatory Visit (INDEPENDENT_AMBULATORY_CARE_PROVIDER_SITE_OTHER): Payer: Medicaid Other

## 2023-05-18 DIAGNOSIS — I472 Ventricular tachycardia, unspecified: Secondary | ICD-10-CM

## 2023-05-18 DIAGNOSIS — Z95810 Presence of automatic (implantable) cardiac defibrillator: Secondary | ICD-10-CM

## 2023-05-18 LAB — CUP PACEART REMOTE DEVICE CHECK
Battery Remaining Longevity: 44 mo
Battery Voltage: 2.97 V
Brady Statistic RV Percent Paced: 0 %
Date Time Interrogation Session: 20241231044223
HighPow Impedance: 50 Ohm
HighPow Impedance: 79 Ohm
Implantable Lead Connection Status: 753985
Implantable Lead Implant Date: 20080828
Implantable Lead Location: 753860
Implantable Lead Model: 6947
Implantable Pulse Generator Implant Date: 20161117
Lead Channel Impedance Value: 342 Ohm
Lead Channel Impedance Value: 399 Ohm
Lead Channel Pacing Threshold Amplitude: 1.375 V
Lead Channel Pacing Threshold Pulse Width: 0.4 ms
Lead Channel Sensing Intrinsic Amplitude: 7.25 mV
Lead Channel Sensing Intrinsic Amplitude: 7.25 mV
Lead Channel Setting Pacing Amplitude: 3 V
Lead Channel Setting Pacing Pulse Width: 0.4 ms
Lead Channel Setting Sensing Sensitivity: 0.3 mV
Zone Setting Status: 755011

## 2023-05-26 ENCOUNTER — Other Ambulatory Visit: Payer: Self-pay | Admitting: Gastroenterology

## 2023-06-10 ENCOUNTER — Other Ambulatory Visit (HOSPITAL_COMMUNITY)
Admission: RE | Admit: 2023-06-10 | Discharge: 2023-06-10 | Disposition: A | Discharge status: Home / Self Care | Payer: Medicaid Other | Source: Ambulatory Visit

## 2023-06-10 ENCOUNTER — Ambulatory Visit: Payer: Medicaid Other

## 2023-06-10 VITALS — BP 131/78 | HR 75 | Ht 66.5 in | Wt 181.2 lb

## 2023-06-10 DIAGNOSIS — N898 Other specified noninflammatory disorders of vagina: Secondary | ICD-10-CM | POA: Insufficient documentation

## 2023-06-10 MED ORDER — TERCONAZOLE 0.4 % VA CREA: 1.0000 | TOPICAL_CREAM | Freq: Every day | VAGINAL | 0 refills | Status: DC

## 2023-06-10 MED ORDER — FLUCONAZOLE 150 MG PO TABS: ORAL_TABLET | ORAL | 1 refills | Status: DC

## 2023-06-10 NOTE — Progress Notes (Signed)
Patient was assessed and managed by nursing staff during this encounter. I have reviewed the chart and agree with the documentation and plan. I have also made any necessary editorial changes.   Patient with known diabetes and h/o yeast infections.  Rx for Diflucan and Terazol cream sent to pharmacy on file.    Cherre Robins, CNM 06/10/2023 4:56 PM

## 2023-06-10 NOTE — Progress Notes (Signed)
SUBJECTIVE:  53 y.o. female complains of vaginal irritation for 4 day(s). Denies abnormal vaginal bleeding or significant pelvic pain or fever. No UTI symptoms. Denies history of known exposure to STD.  Patient's last menstrual period was 07/23/2014.  OBJECTIVE:  She appears well, afebrile. Urine dipstick: not done.  ASSESSMENT:  Vaginal Discharge  Vaginal Odor   PLAN:  GC, chlamydia, trichomonas, BVAG, CVAG probe sent to lab. Treatment: To be determined once lab results are received ROV prn if symptoms persist or worsen.

## 2023-06-13 LAB — CERVICOVAGINAL ANCILLARY ONLY
Bacterial Vaginitis (gardnerella): POSITIVE — AB
Candida Glabrata: NEGATIVE
Candida Vaginitis: POSITIVE — AB
Chlamydia: NEGATIVE
Comment: NEGATIVE
Comment: NEGATIVE
Comment: NEGATIVE
Comment: NEGATIVE
Comment: NEGATIVE
Comment: NORMAL
Neisseria Gonorrhea: NEGATIVE
Trichomonas: NEGATIVE

## 2023-06-15 ENCOUNTER — Ambulatory Visit (HOSPITAL_COMMUNITY): Payer: Medicaid Other | Attending: Physician Assistant

## 2023-06-15 DIAGNOSIS — R0602 Shortness of breath: Secondary | ICD-10-CM

## 2023-06-15 LAB — ECHOCARDIOGRAM COMPLETE
Area-P 1/2: 3.41 cm2
S' Lateral: 2.2 cm

## 2023-06-29 NOTE — Progress Notes (Signed)
Remote ICD transmission.

## 2023-07-07 ENCOUNTER — Telehealth: Payer: Self-pay

## 2023-07-07 NOTE — Telephone Encounter (Signed)
 Called and LM for patient that we need to move her 8:10am appointment tomorrow due to 2 hour weather delay. Asked if she can come a 11:20am.

## 2023-07-08 ENCOUNTER — Ambulatory Visit: Payer: Medicaid Other | Admitting: Gastroenterology

## 2023-07-12 ENCOUNTER — Ambulatory Visit: Payer: Medicaid Other | Admitting: Gastroenterology

## 2023-07-12 NOTE — Progress Notes (Deleted)
 HPI :  seen in November 53 y.o. female with a past medical history of hepatic and pulmonary sarcoidosis, prolonged QT syndrome status post ICD, SVT status post ablation, history of CVA, seizures, DM, IBS with constipation, and fibromyalgia and others listed below presents for evaluation of bulge in AB.    She previously had a [place] with elastography done in 2017 showing a heterogenous liver without mass lesion, with elastography reported with F3-F4 score with high risk of fibrosis. She has had labs for chronic liver diseases which have been unremarkable other than elevated total IgG, with mild positive SMA, and negative ANA. 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 Portal pressures were normal. At that time she was treated with high dose steroids for her sarcoidosis and her LFTs have since normalized.  Over the past few years her LFTs have remained normal and she has been doing really well.  She denies any new cardiopulmonary symptoms.  She is due to see her pulmonologist next week for sarcoidosis.  She has normal platelets.  No clinical signs of cirrhosis at this time.  She has not had an imaging of her liver in a few years.    10/24/14 Upper endoscopy: Normal, no varices noted 10/25/14 oral contrast only CT abdomen pelvis:  Probable mild hepatosplenomegaly. Enlarged uterus containing large left leiomyoma.   12/03/14 Gastric emptying study normal Colonoscopy 2006 - normal Flex sig 2010 - hemorrhoids Flex sig 2011 - banded hemorrhoids Colonoscopy 04/19/2017 - 3 polyps removed, largest 10mm in size - adenomas, repeat in 3 years  02/18/2021 colonoscopy for personal history of colon polyps, good bowel prep showed 1 diminutive polyp cecum, 4 mm polyp ascending colon, 2 diminutive polyps ascending colon, internal hemorrhoids.  Pathology showed 2 of the 4 polyps precancerous recall 02/2026 01/2021 CT abdomen pelvis without contrast for  left lower quadrant abdominal pain unremarkable 05/25/2022 office visit with Shanda Bumps is here PA for nausea, vomiting and dysphagia.  Doing well on Linzess 290 mcg for constipation.  Patient was supposed to be set up for gastric emptying study and EGD however I do not see if this was ever done.   Discussed the use of AI scribe software for clinical note transcription with the patient, who gave verbal consent to proceed.   The patient, a known diabetic with a history of sarcoidosis and a partial hysterectomy, presented with complaints of abdominal distension, resembling a pregnant state, which had been occurring for approximately one to two weeks. The patient described the sensation as feeling extremely full and bloated, with increased gas production, despite passing gas regularly. Bowel movements were reported to be less frequent, occurring approximately three times a week, despite the use of Linzess.   The patient also reported associated nausea and vomiting, with a decreased appetite. Even simple foods such as salads, raw onions, and dressings were reported to induce vomiting or a persistent feeling of nausea. The patient also reported difficulty swallowing certain foods, such as cucumbers and lettuce, which felt as though they were getting stuck in the esophagus. The patient also reported taking Aleve or ibuprofen two to three times a week for pain management.   The patient's surgical history includes a partial hysterectomy, during which the patient experienced complications including hemorrhagic shock and had to have another exploratory lap after the procedure.    The patient's diabetes control was reported to be suboptimal, with blood glucose levels occasionally reaching the 300s.  The patient's most recent A1C was reported to be 11. The patient also reported a recent weight gain, increasing from 168 lbs to approximately 170 lbs.   The patient also reported worsening shortness of breath, which was  exacerbated by exertion such as getting up from a chair or climbing stairs. The patient denied experiencing palpitations but reported occasional coughing. The patient's last pulmonary consultation was approximately a year ago.    Abdominal Distension and Discomfort Patient reports a week-long episode of abdominal distension, discomfort, and excessive gas. Bowel movements occur every 2-3 days despite Linzess 290mg  daily. No evidence of obstruction on physical exam. -Order CT abdomen without contrast to rule out hernia or other structural abnormalities. -Consider gastroparesis given symptoms and poorly controlled diabetes; provide patient with gastroparesis diet information, schedule GES -Trial of Motegrity if approved by insurance for constipation as well as gastroparesis -Flagyl for 10 days to address excessive gas production.   GERD with dysphagia to solids foods, nausea and vomiting Patient reports nausea and vomiting, particularly with certain foods. History of esophageal dilation in the past with some esophageal dysphagia. -Plan for endoscopy once pulmonary status is optimized. - continue protonix daily -Consider gastroparesis given symptoms and poorly controlled diabetes; provide patient with gastroparesis diet information, schedule GES - will not offer reglan with history of QT and VT -Trial of Motegrity if approved by insurance for constipation as well as gastroparesis   Worsening Shortness of Breath Patient reports worsening shortness of breath with exertion and cough x 3-6 months. History of pulmonary and hepatic sarcoidosis. Some SOB with getting onto the table. -Refer to pulmonologist for evaluation before any sedation for endoscopy. -Refer to cardiologist for evaluation.   Poorly Controlled Diabetes Last A1c was 11. Patient reports blood glucose levels frequently in the high 200s. -Encourage tighter glucose control to improve potential gastroparesis symptoms.             Past Medical History:  Diagnosis Date   Abdominal pain, periumbilic    Allergic rhinitis    Allergy    Anemia    Anxiety    Arthritis    Asthma    Automatic implantable cardioverter-defibrillator in situ 2006, replaced 2008   [employer]   Bronchitis    Cancer (HCC)    Nodules in lungs pt believes were cancerous pt unsure   Constipation 02/08/2008   Qualifier: Diagnosis of   By: Arlyce Dice MD, Barbette Hair      COPD (chronic obstructive pulmonary disease) (HCC)    DM (diabetes mellitus) (HCC) 1997/1998   type 1, initially gestational but soon after child birth developed IDDM   Esophageal stricture 2005/2009   esophageal strictures dilated 2005, 2009   Eye hemorrhage    bilateral   Fibroid, uterine    Fibromyalgia    GERD (gastroesophageal reflux disease) 2005   History of blood transfusion years ago   Hypertension    Internal hemorrhoids 2010   2011 band ligation of int rrhoids.   Long Q-T syndrome    Lupus    questionable   Neuropathy in diabetes [facility])    Palpitation    Sarcoidosis    Seizure disorder (HCC)    mid June 2022 last seizure pt reported   Stroke [facility])    QUESTIONABLE TIA .  Jun 29, 2019     Past Surgical History:  Procedure Laterality Date   AUTOMATIC IMPLANTABLE CARDIAC DEFIBRILLATOR SITU  2006   ICD-Medtronic   Remote - Yes x 3   CERVIX REMOVAL  DILITATION & CURRETTAGE/HYSTROSCOPY WITH HYDROTHERMAL ABLATION N/A 06/07/2013   Procedure: DILATATION & CURETTAGE/HYSTEROSCOPY WITH attempted HYDROTHERMAL ABLATION;  Surgeon: Kathreen Cosier, MD;  Location: [facility] ORS;  Service: Gynecology;  Laterality: N/A;   EP IMPLANTABLE DEVICE N/A 04/04/2015   Procedure:  ICD Generator Changeout;  Surgeon: Marinus Maw, MD;  Location: [facility] INVASIVE CV LAB;  Service: Cardiovascular;  Laterality: N/A;   ESOPHAGOGASTRODUODENOSCOPY (EGD) WITH PROPOFOL N/A 10/24/2014   Procedure: ESOPHAGOGASTRODUODENOSCOPY (EGD) WITH PROPOFOL;  Surgeon: Louis Meckel, MD;   Location: [facility] ENDOSCOPY;  Service: Endoscopy;  Laterality: N/A;   EXPLORATORY LAPAROTOMY  11/06/2014   evacuation of post TAH/BSO hematoma   HAND SURGERY Right    x 2   HEMORRHOID BANDING  03/2010   SALPINGECTOMY     SVT ablation     TOTAL ABDOMINAL HYSTERECTOMY W/ BILATERAL SALPINGOOPHORECTOMY  11/06/2014   at [facility] for menorrhagia, pelvic pain and enlarging uterine fibroids   TUBAL LIGATION     VIDEO BRONCHOSCOPY Bilateral 01/24/2015   Procedure: VIDEO BRONCHOSCOPY WITHOUT FLUORO;  Surgeon: Chilton Greathouse, MD;  Location: [facility] ENDOSCOPY;  Service: Cardiopulmonary;  Laterality: Bilateral;   Family History  Problem Relation Age of Onset   Heart disease Mother    Aneurysm Mother    Diabetes Father    Positive PPD/TB Exposure Father    Glaucoma Father    Colon cancer Maternal Grandfather    Stomach cancer Paternal Grandmother    Stomach cancer Paternal Grandfather    Breast cancer Maternal Aunt        x2   Lung cancer Maternal Uncle        x2   Esophageal cancer Paternal Aunt    Stomach cancer Paternal Uncle    Social History   Tobacco Use   Smoking status: Never   Smokeless tobacco: Never  Vaping Use   Vaping status: Never Used  Substance Use Topics   Alcohol use: Yes    Comment: rarely   Drug use: Never   Current Outpatient Medications  Medication Sig Dispense Refill   acetaminophen (TYLENOL) 500 MG tablet Take 1,000 mg by mouth every 6 (six) hours as needed for mild pain or fever. Reported on 07/22/2015     aspirin EC 81 MG tablet Take 1 tablet (81 mg total) by mouth daily. 30 tablet 11   atorvastatin (LIPITOR) 10 MG tablet Take 1 tablet (10 mg total) by mouth daily. 30 tablet 2   azelastine (ASTELIN) 0.1 % nasal spray Place 2 sprays into both nostrils 2 (two) times daily. Use in each nostril as directed 30 mL 12   Blood Pressure Monitoring (CARETOUCH BP ARM MONITOR) DEVI Use as needed to check blood pressure. 1 each 0   Camphor-Menthol-Methyl Sal (SALONPAS) 3.05-23-08 %  PTCH Apply 1 patch topically daily as needed (pain).     fluconazole (DIFLUCAN) 150 MG tablet Take one pill now and repeat in 3 days if symptoms persists. 2 tablet 1   fluticasone (FLOVENT HFA) 110 MCG/ACT inhaler Inhale 2 puffs into the lungs 2 (two) times daily. 1 each 6   glucose blood (ACCU-CHEK GUIDE) test strip Check sugars four times daily 100 each 12   insulin degludec (TRESIBA FLEXTOUCH) 200 UNIT/ML FlexTouch Pen Inject 40 Units into the skin daily. 15 mL 3   Insulin Pen Needle (NOVOFINE) 30G X 8 MM MISC Inject 10 each into the skin as needed. 100 each 5   lamoTRIgine (LAMICTAL) 150 MG tablet Take 150 mg by mouth 2 (two) times daily.  Lancet Devices (ACCU-CHEK SOFTCLIX) lancets Use as instructed 1 each 0   linaclotide (LINZESS) 290 MCG CAPS capsule TAKE 1 CAPSULE(290 MCG) BY MOUTH DAILY BEFORE BREAKFAST 30 capsule 1   metFORMIN (GLUCOPHAGE-XR) 500 MG 24 hr tablet Take 2 tablets (1,000 mg total) by mouth 2 (two) times daily. 360 tablet 3   NOVOLOG FLEXPEN 100 UNIT/ML FlexPen Inject 15 Units into the skin 2 (two) times daily before lunch and supper. 15 mL 3   pantoprazole (PROTONIX) 40 MG tablet Take 1 tablet (40 mg total) by mouth daily. 30 tablet 2   Prenat-Fe Poly-Methfol-FA-DHA (VITAFOL ULTRA) 29-0.6-0.4-200 MG CAPS Take 1 capsule by mouth daily before breakfast. 30 capsule 11   propranolol (INDERAL) 80 MG tablet Take 1 tablet (80 mg total) by mouth 2 (two) times daily. 180 tablet 3   terconazole (TERAZOL 7) 0.4 % vaginal cream Place 1 applicator vaginally at bedtime. 45 g 0   valsartan (DIOVAN) 80 MG tablet Take 1 tablet (80 mg total) by mouth at bedtime. 90 tablet 3   No current facility-administered medications for this visit.   Allergies  Allergen Reactions   Contrast Media [Iodinated Contrast Media] Anaphylaxis and Shortness Of Breath    Needed to be defibrillated and patient was pre-medicated with radiology 13 hour premeds.    Iohexol Anaphylaxis     Code: RASH, Desc: PT  STATES SHE IS ALLERGIC TO IV CONTRAST 05/28/06/RM, Onset Date: [id number]    Augmentin [Amoxicillin-Pot Clavulanate] Other (See Comments)    "stomach hurt"   Codeine Nausea And Vomiting    jittery   Demerol [Meperidine] Other (See Comments)    dysphoria   Morphine And Codeine Nausea And Vomiting   Lantus [Insulin Glargine] Other (See Comments)    Patient reports itching     Review of Systems: All systems reviewed and negative except where noted in HPI.    ECHOCARDIOGRAM COMPLETE Result Date: 06/15/2023    ECHOCARDIOGRAM REPORT   Patient Name:   [name] Date of Exam: 06/15/2023 Medical Rec #:  [id number]        Height:       66.5 in Accession #:    [id number]       Weight:       181.2 lb Date of Birth:  1971-02-05        BSA:          1.928 m Patient Age:    52 years         BP:           131/78 mmHg Patient Gender: F                HR:           75 bpm. Exam Location:  [address] Procedure: 2D Echo, Cardiac Doppler, Color Doppler and Strain Analysis Indications:    R06.00 Dyspnea  History:        Patient has prior history of Echocardiogram examinations, most                 recent 01/28/2015. Pacemaker and Defibrillator, Pulmonary HTN,                 TIA, Stroke and COPD, Signs/Symptoms:Dyspnea; Risk                 Factors:Family History of Coronary Artery Disease, Diabetes,                 Hypertension and Dyslipidemia. Lupus, Palpitations, Long QT  syndrome, Sarcoidosis.  Sonographer:    Farrel Conners RDCS Referring Phys: RENEE LYNN URSUY IMPRESSIONS  1. Left ventricular ejection fraction, by estimation, is 60 to 65%. The left ventricle has normal function. The left ventricle has no regional wall motion abnormalities. There is moderate eccentric left ventricular hypertrophy of the septal segment. Left ventricular diastolic parameters were normal. The average left ventricular global longitudinal strain is -19.4 %. The global longitudinal strain is normal.  2. Right  ventricular systolic function is normal. The right ventricular size is normal. There is normal pulmonary artery systolic pressure.  3. Left atrial size was mildly dilated.  4. The mitral valve is normal in structure. Trivial mitral valve regurgitation. No evidence of mitral stenosis.  5. The aortic valve is tricuspid. Aortic valve regurgitation is not visualized. No aortic stenosis is present.  6. The inferior vena cava is normal in size with greater than 50% respiratory variability, suggesting right atrial pressure of 3 mmHg. Comparison(s): No significant change from prior study. FINDINGS  Left Ventricle: Left ventricular ejection fraction, by estimation, is 60 to 65%. The left ventricle has normal function. The left ventricle has no regional wall motion abnormalities. The average left ventricular global longitudinal strain is -19.4 %. The global longitudinal strain is normal. The left ventricular internal cavity size was normal in size. There is moderate eccentric left ventricular hypertrophy of the septal segment. Left ventricular diastolic parameters were normal. Right Ventricle: The right ventricular size is normal. No increase in right ventricular wall thickness. Right ventricular systolic function is normal. There is normal pulmonary artery systolic pressure. The tricuspid regurgitant velocity is 2.40 m/s, and  with an assumed right atrial pressure of 3 mmHg, the estimated right ventricular systolic pressure is 26.0 mmHg. Left Atrium: Left atrial size was mildly dilated. Right Atrium: Right atrial size was normal in size. Pericardium: There is no evidence of pericardial effusion. Mitral Valve: The mitral valve is normal in structure. Trivial mitral valve regurgitation. No evidence of mitral valve stenosis. Tricuspid Valve: The tricuspid valve is normal in structure. Tricuspid valve regurgitation is trivial. No evidence of tricuspid stenosis. Aortic Valve: The aortic valve is tricuspid. Aortic valve  regurgitation is not visualized. No aortic stenosis is present. Pulmonic Valve: The pulmonic valve was not well visualized. Pulmonic valve regurgitation is trivial. No evidence of pulmonic stenosis. Aorta: The aortic root, ascending aorta, aortic arch and descending aorta are all structurally normal, with no evidence of dilitation or obstruction. Venous: The inferior vena cava is normal in size with greater than 50% respiratory variability, suggesting right atrial pressure of 3 mmHg. IAS/Shunts: The atrial septum is grossly normal. Additional Comments: A device lead is visualized.  LEFT VENTRICLE PLAX 2D LVIDd:         3.90 cm   Diastology LVIDs:         2.20 cm   LV e' medial:    6.09 cm/s LV PW:         1.00 cm   LV E/e' medial:  12.8 LV IVS:        1.87 cm   LV e' lateral:   8.92 cm/s LVOT diam:     2.50 cm   LV E/e' lateral: 8.7 LV SV:         121 LV SV Index:   63        2D Longitudinal Strain LVOT Area:     4.91 cm  2D Strain GLS (A2C):   -19.6 %  2D Strain GLS (A3C):   -19.8 %                          2D Strain GLS (A4C):   -18.7 %                          2D Strain GLS Avg:     -19.4 % RIGHT VENTRICLE RV Basal diam:  3.00 cm RV S prime:     16.20 cm/s TAPSE (M-mode): 2.1 cm RVSP:           26.0 mmHg LEFT ATRIUM             Index        RIGHT ATRIUM           Index LA diam:        4.10 cm 2.13 cm/m   RA Pressure: 3.00 mmHg LA Vol (A2C):   47.3 ml 24.53 ml/m  RA Area:     11.50 cm LA Vol (A4C):   46.7 ml 24.22 ml/m  RA Volume:   25.80 ml  13.38 ml/m LA Biplane Vol: 50.1 ml 25.98 ml/m  AORTIC VALVE LVOT Vmax:   110.00 cm/s LVOT Vmean:  75.300 cm/s LVOT VTI:    0.248 m  AORTA Ao Root diam: 2.90 cm Ao Asc diam:  3.20 cm MITRAL VALVE               TRICUSPID VALVE MV Area (PHT): 3.41 cm    TR Peak grad:   23.0 mmHg MV Decel Time: 223 msec    TR Vmax:        240.00 cm/s MV E velocity: 77.90 cm/s  Estimated RAP:  3.00 mmHg MV A velocity: 67.43 cm/s  RVSP:           26.0 mmHg MV E/A  ratio:  1.16                            SHUNTS                            Systemic VTI:  0.25 m                            Systemic Diam: 2.50 cm Jodelle Red MD Electronically signed by Jodelle Red MD Signature Date/Time: 06/15/2023/7:41:43 PM    Final     Physical Exam: LMP 07/23/2014  Constitutional: Pleasant,well-developed, ***female in no acute distress. HEENT: Normocephalic and atraumatic. Conjunctivae are normal. No scleral icterus. Neck supple.  Cardiovascular: Normal rate, regular rhythm.  Pulmonary/chest: Effort normal and breath sounds normal. No wheezing, rales or rhonchi. Abdominal: Soft, nondistended, nontender. Bowel sounds active throughout. There are no masses palpable. No hepatomegaly. Extremities: no edema Lymphadenopathy: No cervical adenopathy noted. Neurological: Alert and oriented to person place and time. Skin: Skin is warm and dry. No rashes noted. Psychiatric: Normal mood and affect. Behavior is normal.   ASSESSMENT: 53 y.o. female here for assessment of the following  No diagnosis found.  PLAN:   Vonna Drafts, MD

## 2023-08-17 ENCOUNTER — Ambulatory Visit (INDEPENDENT_AMBULATORY_CARE_PROVIDER_SITE_OTHER): Payer: Medicaid Other

## 2023-08-17 DIAGNOSIS — I472 Ventricular tachycardia, unspecified: Secondary | ICD-10-CM | POA: Diagnosis not present

## 2023-08-18 LAB — CUP PACEART REMOTE DEVICE CHECK
Battery Remaining Longevity: 41 mo
Battery Voltage: 2.97 V
Brady Statistic RV Percent Paced: 0 %
Date Time Interrogation Session: 20250401043725
HighPow Impedance: 48 Ohm
HighPow Impedance: 73 Ohm
Implantable Lead Connection Status: 753985
Implantable Lead Implant Date: 20080828
Implantable Lead Location: 753860
Implantable Lead Model: 6947
Implantable Pulse Generator Implant Date: 20161117
Lead Channel Impedance Value: 342 Ohm
Lead Channel Impedance Value: 380 Ohm
Lead Channel Pacing Threshold Amplitude: 1.375 V
Lead Channel Pacing Threshold Pulse Width: 0.4 ms
Lead Channel Sensing Intrinsic Amplitude: 3.875 mV
Lead Channel Sensing Intrinsic Amplitude: 3.875 mV
Lead Channel Setting Pacing Amplitude: 3 V
Lead Channel Setting Pacing Pulse Width: 0.4 ms
Lead Channel Setting Sensing Sensitivity: 0.3 mV
Zone Setting Status: 755011

## 2023-08-22 ENCOUNTER — Encounter: Payer: Self-pay | Admitting: Internal Medicine

## 2023-09-30 ENCOUNTER — Encounter: Payer: Self-pay | Admitting: *Deleted

## 2023-09-30 NOTE — Addendum Note (Signed)
 Addended by: Lott Rouleau A on: 09/30/2023 09:36 AM   Modules accepted: Orders

## 2023-09-30 NOTE — Progress Notes (Signed)
 Remote ICD transmission.

## 2023-10-04 ENCOUNTER — Other Ambulatory Visit: Payer: Self-pay

## 2023-10-04 DIAGNOSIS — E119 Type 2 diabetes mellitus without complications: Secondary | ICD-10-CM

## 2023-10-04 DIAGNOSIS — E1165 Type 2 diabetes mellitus with hyperglycemia: Secondary | ICD-10-CM

## 2023-10-04 MED ORDER — TRESIBA FLEXTOUCH 200 UNIT/ML ~~LOC~~ SOPN: 40.0000 [IU] | PEN_INJECTOR | Freq: Every day | SUBCUTANEOUS | 3 refills | Status: DC

## 2023-10-07 ENCOUNTER — Other Ambulatory Visit (HOSPITAL_COMMUNITY): Payer: Self-pay

## 2023-10-07 ENCOUNTER — Telehealth: Payer: Self-pay

## 2023-10-07 NOTE — Telephone Encounter (Signed)
 Pharmacy Patient Advocate Encounter   Received notification from Patient Pharmacy that prior authorization for TRESIBA  U200 is required/requested.   Insurance verification completed.   The patient is insured through [facility] MEDICAID .   PA required; PA submitted to above mentioned insurance via CoverMyMeds Key/confirmation #/EOC [id number]. Status is pending

## 2023-10-18 ENCOUNTER — Other Ambulatory Visit (HOSPITAL_COMMUNITY): Payer: Self-pay

## 2023-10-18 NOTE — Telephone Encounter (Signed)
 Pharmacy Patient Advocate Encounter  Prior Authorization for TRESIBA  U200 has been approved. Dates unknown.

## 2023-10-25 ENCOUNTER — Ambulatory Visit

## 2023-10-25 ENCOUNTER — Other Ambulatory Visit (HOSPITAL_COMMUNITY)
Admission: RE | Admit: 2023-10-25 | Discharge: 2023-10-25 | Disposition: A | Discharge status: Home / Self Care | Source: Ambulatory Visit | Attending: Advanced Practice Midwife | Admitting: Advanced Practice Midwife

## 2023-10-25 VITALS — BP 151/84 | HR 80

## 2023-10-25 DIAGNOSIS — N898 Other specified noninflammatory disorders of vagina: Secondary | ICD-10-CM | POA: Diagnosis not present

## 2023-10-25 NOTE — Progress Notes (Addendum)
 SUBJECTIVE:  53 y.o. female complains of vaginal itching 1 week(s) ~10/19/23. Denies abnormal vaginal bleeding or significant pelvic pain or fever. No UTI symptoms. Denies history of known exposure to STD.  Patient's last menstrual period was 07/23/2014.  OBJECTIVE:  She appears well, afebrile. Urine dipstick: not done.  ASSESSMENT:  Vaginal itching   PLAN:  GC, chlamydia, trichomonas, BVAG, CVAG probe sent to lab. Treatment: To be determined once lab results are received ROV prn if symptoms persist or worsen.

## 2023-10-26 ENCOUNTER — Ambulatory Visit: Payer: Self-pay | Admitting: Obstetrics and Gynecology

## 2023-10-26 LAB — CERVICOVAGINAL ANCILLARY ONLY
Bacterial Vaginitis (gardnerella): NEGATIVE
Candida Glabrata: NEGATIVE
Candida Vaginitis: POSITIVE — AB
Chlamydia: NEGATIVE
Comment: NEGATIVE
Comment: NEGATIVE
Comment: NEGATIVE
Comment: NEGATIVE
Comment: NEGATIVE
Comment: NORMAL
Neisseria Gonorrhea: NEGATIVE
Trichomonas: NEGATIVE

## 2023-10-26 MED ORDER — FLUCONAZOLE 150 MG PO TABS: 150.0000 mg | ORAL_TABLET | Freq: Once | ORAL | 0 refills | Status: DC

## 2023-11-02 ENCOUNTER — Encounter: Payer: Self-pay | Admitting: Family Medicine

## 2023-11-02 ENCOUNTER — Other Ambulatory Visit (HOSPITAL_COMMUNITY): Payer: Self-pay

## 2023-11-02 ENCOUNTER — Ambulatory Visit: Payer: Self-pay

## 2023-11-02 ENCOUNTER — Ambulatory Visit (INDEPENDENT_AMBULATORY_CARE_PROVIDER_SITE_OTHER): Admitting: Family Medicine

## 2023-11-02 VITALS — BP 137/87 | HR 85 | Ht 66.5 in | Wt 171.4 lb

## 2023-11-02 DIAGNOSIS — R051 Acute cough: Secondary | ICD-10-CM

## 2023-11-02 DIAGNOSIS — R634 Abnormal weight loss: Secondary | ICD-10-CM | POA: Diagnosis present

## 2023-11-02 DIAGNOSIS — E119 Type 2 diabetes mellitus without complications: Secondary | ICD-10-CM | POA: Diagnosis not present

## 2023-11-02 DIAGNOSIS — Z794 Long term (current) use of insulin: Secondary | ICD-10-CM | POA: Diagnosis not present

## 2023-11-02 DIAGNOSIS — E1165 Type 2 diabetes mellitus with hyperglycemia: Secondary | ICD-10-CM

## 2023-11-02 MED ORDER — NOVOLOG FLEXPEN 100 UNIT/ML ~~LOC~~ SOPN
15.0000 [IU] | PEN_INJECTOR | Freq: Two times a day (BID) | SUBCUTANEOUS | 3 refills | Status: DC
  Filled 2023-11-02: qty 15, 50d supply, fill #0

## 2023-11-02 MED ORDER — TRESIBA FLEXTOUCH 200 UNIT/ML ~~LOC~~ SOPN
40.0000 [IU] | PEN_INJECTOR | Freq: Every day | SUBCUTANEOUS | 3 refills | Status: DC
  Filled 2023-11-02: qty 15, 75d supply, fill #0

## 2023-11-02 NOTE — Patient Instructions (Addendum)
 Good to see you today - Thank you for coming in  Things we discussed today:  1) Your weight loss could be due to running out of your insulin . I will also check a chest XR for you. Come back in 3 weeks and we will remeasure your weight when you are back on your insulin .     2) I sent your insulin  to the [facility]  [address]   3) You can get your [place] done at [facility] Radiology department   [address]

## 2023-11-02 NOTE — Telephone Encounter (Signed)
 FYI pt has appt with PCP this afternoon

## 2023-11-02 NOTE — Progress Notes (Signed)
    SUBJECTIVE:   CHIEF COMPLAINT / HPI:   FH is a 53yo F that pf weight loss  Weight Loss - Reports ~10lb weight loss over last month - Having worsening cough w/ foamy white sputum. - Denies any new worsening SOB, but has low stamina at baseline - Has been having looser stools for past week. No blood in stool. - Tried to reach out to her pulmonologist, but they were too booked so they recommended she f/u with us  if she could be seen sooner. - She was instructed in the past that weight loss could be a sign of her sarcoidosis flaring. - Reports diet and appetite are stable.  - She will see her GI doctor this Monday - Has been out of insulin  since 10/21/23. She has had trouble picking this up due to her pharmacy being out of stock.    PERTINENT  PMH / PSH: HTN, T2DM, sarcoidosis,   OBJECTIVE:   BP 137/87   Pulse 85   Ht 5' 6.5 (1.689 m)   Wt 171 lb 6.4 oz (77.7 kg)   LMP 07/23/2014   SpO2 99%   BMI 27.25 kg/m   General: Alert, pleasant well appearing woman. NAD. HEENT: NCAT. MMM. CV: RRR, no murmurs.  Resp: CTAB, no wheezing or crackles. Normal WOB on RA.  Abm: Soft, nontender, nondistended. BS present. Ext: Moves all ext spontaneously Skin: Warm, well perfused   ASSESSMENT/PLAN:   Assessment & Plan Weight loss Differential includes ketosis starvation from not taking insulin , sarcoidosis flare, malignancy. Will order CXR for further workup of sarcoidosis. Plan to f/u w/ GI as well.  - f/u 3 weeks. If 5 lbs weight loss, then instructed to come back sooner  - consider checking CBC, CMP, LDH, ESR - f/u w/ GI on Monday - due for mammogram - refill of insulin  sent to another pharmacy. Instructed to call back if she is unable to restart her insulin  by end of week    If >5lb weigh loss, come in sooner   Twyla Nearing, MD [facility]

## 2023-11-02 NOTE — Telephone Encounter (Addendum)
 FYI Only or Action Required?: FYI only for provider  Patient is followed in Pulmonology for Sarcoidosis, last seen on 06/06/2021 by Roetta Clarke, NP. Called Nurse Triage reporting Breathing Problem. Symptoms began several weeks ago. Interventions attempted: Nothing. Symptoms are: gradually worsening.  Triage Disposition: See Physician Within 24 Hours, Call PCP Now  Patient/caregiver understands and will follow disposition?: Unsure     Copied from CRM [id number]. Topic: Clinical - Red Word Triage >> Nov 02, 2023 12:53 PM Eveleen Hinds B wrote: [facility] that prompted transfer to Nurse Triage:  Shortness of Breath   ----------------------------------------------------------------------- From previous Reason for Contact - Scheduling: Patient/patient representative is calling to schedule an appointment. Refer to attachments for appointment information. Reason for Disposition  [1] MILD difficulty breathing (e.g., minimal/no SOB at rest, SOB with walking, pulse <100) AND [2] NEW-onset or WORSE than normal  Answer Assessment - Initial Assessment Questions E2C2 Pulmonary Triage - Initial Assessment Questions Chief Complaint (e.g., cough, sob, wheezing, fever, chills, sweat or additional symptoms) *Go to specific symptom protocol after initial questions. SOB - worsening since Thursday fatigue  How long have symptoms been present? Past few weeks  Have you tested for COVID or Flu? Note: If not, ask patient if a home test can be taken. If so, instruct patient to call back for positive results. No  MEDICINES:    Have you used your inhalers/maintenance medication? No If yes, What medications? fluticasone  (FLOVENT  HFA) - has not been using, Rx expired  If inhaler, ask How many puffs and how often? Note: Review instructions on medication in the chart. See above  OXYGEN : Do you wear supplemental oxygen ? No If yes, How many liters are you supposed to use? N/a  Do you monitor  your oxygen  levels? No If yes, What is your reading (oxygen  level) today? N/a  What is your usual oxygen  saturation reading?  (Note: Pulmonary O2 sats should be 90% or greater) N/a   1. RESPIRATORY STATUS: Describe your breathing? (e.g., wheezing, shortness of breath, unable to speak, severe coughing)      SOB, dry cough/sometimes foamy, fatigue 2. ONSET: When did this breathing problem begin?      Several weeks, worsening since Thursday 3. PATTERN Does the difficult breathing come and go, or has it been constant since it started?      constant 4. SEVERITY: How bad is your breathing? (e.g., mild, moderate, severe)    - MILD: No SOB at rest, mild SOB with walking, speaks normally in sentences, can lie down, no retractions, pulse < 100.    - MODERATE: SOB at rest, SOB with minimal exertion and prefers to sit, cannot lie down flat, speaks in phrases, mild retractions, audible wheezing, pulse 100-120.    - SEVERE: Very SOB at rest, speaks in single words, struggling to breathe, sitting hunched forward, retractions, pulse > 120      Mild-moderate - reports also at rest, worse with exertion Triager does not appreciate audible SOB/wheezing during call. Pt is speaking in full sentences.  5. RECURRENT SYMPTOM: Have you had difficulty breathing before? If Yes, ask: When was the last time? and What happened that time?      Yes, over a year ago 6. CARDIAC HISTORY: Do you have any history of heart disease? (e.g., heart attack, angina, bypass surgery, angioplasty)      Reports defibrillator Denies others 7. LUNG HISTORY: Do you have any history of lung disease?  (e.g., pulmonary embolus, asthma, emphysema)     Sarcoidosis, COPD 8.  CAUSE: What do you think is causing the breathing problem?      Possibly sarcoidosis 9. OTHER SYMPTOMS: Do you have any other symptoms? (e.g., dizziness, runny nose, cough, chest pain, fever)     denies 10. O2 SATURATION MONITOR:  Do you use an  oxygen  saturation monitor (pulse oximeter) at home? If Yes, ask: What is your reading (oxygen  level) today? What is your usual oxygen  saturation reading? (e.g., 95%)       N/a 11. PREGNANCY: Is there any chance you are pregnant? When was your last menstrual period?       N/a 12. TRAVEL: Have you traveled out of the country in the last month? (e.g., travel history, exposures)       N/a  Protocols used: Breathing Difficulty-A-AH

## 2023-11-03 ENCOUNTER — Other Ambulatory Visit (HOSPITAL_COMMUNITY): Payer: Self-pay

## 2023-11-07 NOTE — Progress Notes (Deleted)
 Ellouise Console, PA-C [address] [place], [place]  [address] Phone: [phone]   Primary Care Physician: Romelle Booty, MD  Primary Gastroenterologist:  Ellouise Console, PA-C / Elspeth Naval, MD   Chief Complaint: Weight loss, vomiting       HPI:   [name] is a 53 y.o. female is referred to evaluate weight loss and vomiting.  Has Chronic Constipation treated with Linzess  .  She has history of chronic nausea and vomiting since at least 2016.  Current symptoms: Marijuana?  PMH: hepatic and pulmonary sarcoidosis, prolonged QT syndrome status post ICD, PSVT status post ablation, COPD, history of CVA, seizures, DM, IBS with constipation, and fibromyalgia.  Hx Poorly controlled diabetes.   02/18/2021 colonoscopy (for personal history of colon polyps): Good Prep.  2 small adenomatous polyps removed.  5 year repeat.  (due 02/2026)  01/2021 CT abdomen pelvis without contrast (for LLQ pain): unremarkable.  Gallbladder and liver appeared normal.  06/2015 complete abdominal ultrasound with elastography: Gallbladder contracted.  No gallstones.  CBD 6 mm.  Upper normal.  Liver showed heterogeneous echogenicity.  [place] fibrosis score F4 and F3, high risk of fibrosis.  11/2014 last gastric emptying study (for nausea, vomiting, weight loss, upper abdominal pain): Normal.  45% retention at 60 minutes and 8% retention at 120 minutes.  10/2014 last EGD by Dr. Debrah (for nausea and vomiting): Normal.  Current Outpatient Medications  Medication Sig Dispense Refill   acetaminophen  (TYLENOL ) 500 MG tablet Take 1,000 mg by mouth every 6 (six) hours as needed for mild pain or fever. Reported on 07/22/2015     aspirin  EC 81 MG tablet Take 1 tablet (81 mg total) by mouth daily. 30 tablet 11   atorvastatin  (LIPITOR) 10 MG tablet Take 1 tablet (10 mg total) by mouth daily. 30 tablet 2   azelastine  (ASTELIN ) 0.1 % nasal spray Place 2 sprays into both nostrils 2 (two) times daily.  Use in each nostril as directed 30 mL 12   Blood Pressure Monitoring (CARETOUCH BP ARM MONITOR) DEVI Use as needed to check blood pressure. 1 each 0   Camphor-Menthol -Methyl Sal (SALONPAS) 3.05-23-08 % PTCH Apply 1 patch topically daily as needed (pain).     fluconazole  (DIFLUCAN ) 150 MG tablet Take one pill now and repeat in 3 days if symptoms persists. 2 tablet 1   fluticasone  (FLOVENT  HFA) 110 MCG/ACT inhaler Inhale 2 puffs into the lungs 2 (two) times daily. 1 each 6   glucose blood (ACCU-CHEK GUIDE) test strip Check sugars four times daily 100 each 12   insulin  degludec (TRESIBA  FLEXTOUCH) 200 UNIT/ML FlexTouch Pen Inject 40 Units into the skin daily. 15 mL 3   Insulin  Pen Needle (NOVOFINE) 30G X 8 MM MISC Inject 10 each into the skin as needed. 100 each 5   lamoTRIgine  (LAMICTAL ) 150 MG tablet Take 150 mg by mouth 2 (two) times daily.     Lancet Devices (ACCU-CHEK SOFTCLIX) lancets Use as instructed 1 each 0   linaclotide  (LINZESS ) 290 MCG CAPS capsule TAKE 1 CAPSULE(290 MCG) BY MOUTH DAILY BEFORE BREAKFAST 30 capsule 1   metFORMIN  (GLUCOPHAGE -XR) 500 MG 24 hr tablet Take 2 tablets (1,000 mg total) by mouth 2 (two) times daily. 360 tablet 3   NOVOLOG  FLEXPEN 100 UNIT/ML FlexPen Inject 15 Units into the skin 2 (two) times daily before lunch and supper. 15 mL 3   pantoprazole  (PROTONIX ) 40 MG tablet Take 1 tablet (40 mg total) by mouth daily. 30  tablet 2   Prenat-Fe Poly-Methfol-FA-DHA (VITAFOL  ULTRA) 29-0.6-0.4-200 MG CAPS Take 1 capsule by mouth daily before breakfast. 30 capsule 11   propranolol  (INDERAL ) 80 MG tablet Take 1 tablet (80 mg total) by mouth 2 (two) times daily. 180 tablet 3   terconazole  (TERAZOL 7 ) 0.4 % vaginal cream Place 1 applicator vaginally at bedtime. 45 g 0   valsartan  (DIOVAN ) 80 MG tablet Take 1 tablet (80 mg total) by mouth at bedtime. 90 tablet 3   No current facility-administered medications for this visit.    Allergies as of 11/08/2023 - Review Complete  11/02/2023  Allergen Reaction Noted   Contrast media [iodinated contrast media] Anaphylaxis and Shortness Of Breath 05/30/2013   Iohexol  Anaphylaxis 05/28/2006   Augmentin  [amoxicillin -pot clavulanate] Other (See Comments) 10/23/2014   Codeine Nausea And Vomiting    Demerol [meperidine] Other (See Comments) 07/22/2015   Morphine and codeine Nausea And Vomiting 05/30/2013   Lantus  [insulin  glargine] Other (See Comments) 06/30/2019    Past Medical History:  Diagnosis Date   Abdominal pain, periumbilic    Allergic rhinitis    Allergy    Anemia    Anxiety    Arthritis    Asthma    Automatic implantable cardioverter-defibrillator in situ 2006, replaced 2008   MEDTRONIC   Bronchitis    Cancer (HCC)    Nodules in lungs pt believes were cancerous pt unsure   Constipation 02/08/2008   Qualifier: Diagnosis of   By: Debrah MD, Lamar BIRCH      COPD (chronic obstructive pulmonary disease) (HCC)    DM (diabetes mellitus) (HCC) 1997/1998   type 1, initially gestational but soon after child birth developed IDDM   Esophageal stricture 2005/2009   esophageal strictures dilated 2005, 2009   Eye hemorrhage    bilateral   Fibroid, uterine    Fibromyalgia    GERD (gastroesophageal reflux disease) 2005   History of blood transfusion years ago   Hypertension    Internal hemorrhoids 2010   2011 band ligation of int rrhoids.   Long Q-T syndrome    Lupus    questionable   Neuropathy in diabetes [facility])    Palpitation    Sarcoidosis    Seizure disorder (HCC)    mid June 2022 last seizure pt reported   Stroke [facility])    QUESTIONABLE TIA .  Jun 29, 2019    Past Surgical History:  Procedure Laterality Date   AUTOMATIC IMPLANTABLE CARDIAC DEFIBRILLATOR SITU  2006   ICD-Medtronic   Remote - Yes x 3   CERVIX REMOVAL     DILITATION & CURRETTAGE/HYSTROSCOPY WITH HYDROTHERMAL ABLATION N/A 06/07/2013   Procedure: DILATATION & CURETTAGE/HYSTEROSCOPY WITH attempted HYDROTHERMAL ABLATION;  Surgeon:  Aida DELENA Na, MD;  Location: [facility] ORS;  Service: Gynecology;  Laterality: N/A;   EP IMPLANTABLE DEVICE N/A 04/04/2015   Procedure:  ICD Generator Changeout;  Surgeon: Danelle LELON Birmingham, MD;  Location: [facility] INVASIVE CV LAB;  Service: Cardiovascular;  Laterality: N/A;   ESOPHAGOGASTRODUODENOSCOPY (EGD) WITH PROPOFOL  N/A 10/24/2014   Procedure: ESOPHAGOGASTRODUODENOSCOPY (EGD) WITH PROPOFOL ;  Surgeon: Lamar BIRCH Debrah, MD;  Location: [facility] ENDOSCOPY;  Service: Endoscopy;  Laterality: N/A;   EXPLORATORY LAPAROTOMY  11/06/2014   evacuation of post TAH/BSO hematoma   HAND SURGERY Right    x 2   HEMORRHOID BANDING  03/2010   SALPINGECTOMY     SVT ablation     TOTAL ABDOMINAL HYSTERECTOMY W/ BILATERAL SALPINGOOPHORECTOMY  11/06/2014   at [facility]. for menorrhagia, pelvic pain and  enlarging uterine fibroids   TUBAL LIGATION     VIDEO BRONCHOSCOPY Bilateral 01/24/2015   Procedure: VIDEO BRONCHOSCOPY WITHOUT FLUORO;  Surgeon: Lonna Coder, MD;  Location: [facility] ENDOSCOPY;  Service: Cardiopulmonary;  Laterality: Bilateral;    Review of Systems:    All systems reviewed and negative except where noted in HPI.    Physical Exam:  LMP 07/23/2014  Patient's last menstrual period was 07/23/2014.  General: Well-nourished, well-developed in no acute distress.  Lungs: Clear to auscultation bilaterally. Non-labored. Heart: Regular rate and rhythm, no murmurs rubs or gallops.  Abdomen: Bowel sounds are normal; Abdomen is Soft; No hepatosplenomegaly, masses or hernias;  No Abdominal Tenderness; No guarding or rebound tenderness. Neuro: Alert and oriented x 3.  Grossly intact.  Psych: Alert and cooperative, normal mood and affect.   Imaging Studies: No results found.  Labs: CBC    Component Value Date/Time   WBC 5.6 04/07/2023 1204   RBC 4.89 04/07/2023 1204   HGB 14.7 04/07/2023 1204   HGB 13.2 02/04/2021 1241   HCT 42.6 04/07/2023 1204   HCT 37.6 02/04/2021 1241   PLT 238.0 04/07/2023 1204   PLT 239  02/04/2021 1241   MCV 87.0 04/07/2023 1204   MCV 87 02/04/2021 1241   MCH 30.4 02/04/2021 1241   MCH 29.3 07/13/2020 0112   MCHC 34.5 04/07/2023 1204   RDW 12.5 04/07/2023 1204   RDW 11.4 (L) 02/04/2021 1241   LYMPHSABS 2.3 04/07/2023 1204   MONOABS 0.4 04/07/2023 1204   EOSABS 0.1 04/07/2023 1204   BASOSABS 0.1 04/07/2023 1204    CMP     Component Value Date/Time   NA 136 04/07/2023 1204   NA 136 08/17/2022 1230   K 3.8 04/07/2023 1204   CL 99 04/07/2023 1204   CO2 29 04/07/2023 1204   GLUCOSE 300 (H) 04/07/2023 1204   BUN 8 04/07/2023 1204   BUN 7 08/17/2022 1230   CREATININE 0.59 04/07/2023 1204   CREATININE 0.41 (L) 03/28/2015 0848   CALCIUM  9.3 04/07/2023 1204   PROT 7.5 04/07/2023 1204   ALBUMIN 4.4 04/07/2023 1204   AST 17 04/07/2023 1204   ALT 16 04/07/2023 1204   ALKPHOS 109 04/07/2023 1204   BILITOT 0.7 04/07/2023 1204   GFRNONAA >60 07/13/2020 0112   GFRAA >60 06/30/2019 0404       Assessment and Plan:   SURIE SUCHOCKI is a 53 y.o. y/o female returns for follow-up of chronic nausea and vomiting ongoing for many years (since at least 2016).  She has history of poorly controlled diabetes.  Differential includes, but is not limited to:  gastroparesis, biliary dyskinesia, constipation, chronic cholecystitis, IBS, functional dyspepsia, GERD, peptic ulcer, H. pylori gastritis.  1.  Chronic nausea and vomiting - H. pylori stool test - HIDA scan - Gastric emptying study - Encouraged better control of blood sugars - Rx Zofran  - Avoid marijuana  2.  Chronic abdominal pain  3.  Chronic constipation - Continue Linzess  290  4.  GERD - Continue pantoprazole  40 Mg daily  5.  History of adenomatous colon polyps - 5-year repeat colonoscopy will be due 02/2026    Ellouise Console, PA-C  Follow up ***

## 2023-11-08 ENCOUNTER — Ambulatory Visit: Admitting: Physician Assistant

## 2023-11-16 ENCOUNTER — Ambulatory Visit (INDEPENDENT_AMBULATORY_CARE_PROVIDER_SITE_OTHER): Payer: Medicaid Other

## 2023-11-16 DIAGNOSIS — I472 Ventricular tachycardia, unspecified: Secondary | ICD-10-CM | POA: Diagnosis not present

## 2023-11-16 LAB — CUP PACEART REMOTE DEVICE CHECK
Battery Remaining Longevity: 38 mo
Battery Voltage: 2.97 V
Brady Statistic RV Percent Paced: 0 %
Date Time Interrogation Session: 20250701022725
HighPow Impedance: 54 Ohm
HighPow Impedance: 87 Ohm
Implantable Lead Connection Status: 753985
Implantable Lead Implant Date: 20080828
Implantable Lead Location: 753860
Implantable Lead Model: 6947
Implantable Pulse Generator Implant Date: 20161117
Lead Channel Impedance Value: 380 Ohm
Lead Channel Impedance Value: 399 Ohm
Lead Channel Pacing Threshold Amplitude: 1.375 V
Lead Channel Pacing Threshold Pulse Width: 0.4 ms
Lead Channel Sensing Intrinsic Amplitude: 2.625 mV
Lead Channel Sensing Intrinsic Amplitude: 2.625 mV
Lead Channel Setting Pacing Amplitude: 3 V
Lead Channel Setting Pacing Pulse Width: 0.4 ms
Lead Channel Setting Sensing Sensitivity: 0.3 mV
Zone Setting Status: 755011

## 2023-11-24 ENCOUNTER — Ambulatory Visit: Payer: Self-pay | Admitting: Internal Medicine

## 2023-12-01 ENCOUNTER — Other Ambulatory Visit: Payer: Self-pay

## 2023-12-01 ENCOUNTER — Emergency Department (HOSPITAL_COMMUNITY)

## 2023-12-01 ENCOUNTER — Emergency Department (HOSPITAL_COMMUNITY)
Admission: EM | Admit: 2023-12-01 | Discharge: 2023-12-01 | Disposition: A | Discharge status: Home / Self Care | Attending: Emergency Medicine | Admitting: Emergency Medicine

## 2023-12-01 DIAGNOSIS — Z79899 Other long term (current) drug therapy: Secondary | ICD-10-CM | POA: Insufficient documentation

## 2023-12-01 DIAGNOSIS — W19XXXA Unspecified fall, initial encounter: Secondary | ICD-10-CM

## 2023-12-01 DIAGNOSIS — I10 Essential (primary) hypertension: Secondary | ICD-10-CM | POA: Diagnosis not present

## 2023-12-01 DIAGNOSIS — J449 Chronic obstructive pulmonary disease, unspecified: Secondary | ICD-10-CM | POA: Insufficient documentation

## 2023-12-01 DIAGNOSIS — E109 Type 1 diabetes mellitus without complications: Secondary | ICD-10-CM | POA: Diagnosis not present

## 2023-12-01 DIAGNOSIS — K6289 Other specified diseases of anus and rectum: Secondary | ICD-10-CM | POA: Insufficient documentation

## 2023-12-01 DIAGNOSIS — Z7984 Long term (current) use of oral hypoglycemic drugs: Secondary | ICD-10-CM | POA: Insufficient documentation

## 2023-12-01 DIAGNOSIS — Z794 Long term (current) use of insulin: Secondary | ICD-10-CM | POA: Insufficient documentation

## 2023-12-01 DIAGNOSIS — Z85118 Personal history of other malignant neoplasm of bronchus and lung: Secondary | ICD-10-CM | POA: Diagnosis not present

## 2023-12-01 DIAGNOSIS — Z7982 Long term (current) use of aspirin: Secondary | ICD-10-CM | POA: Diagnosis not present

## 2023-12-01 DIAGNOSIS — J45909 Unspecified asthma, uncomplicated: Secondary | ICD-10-CM | POA: Diagnosis not present

## 2023-12-01 LAB — CBG MONITORING, ED: Glucose-Capillary: 301 mg/dL — ABNORMAL HIGH (ref 70–99)

## 2023-12-01 MED ORDER — ACETAMINOPHEN 500 MG PO TABS
1000.0000 mg | ORAL_TABLET | Freq: Once | ORAL | Status: AC
  Administered 2023-12-01: 1000 mg via ORAL
  Filled 2023-12-01: qty 2

## 2023-12-01 MED ORDER — METHOCARBAMOL 500 MG PO TABS: 500.0000 mg | ORAL_TABLET | Freq: Two times a day (BID) | ORAL | 0 refills | Status: DC

## 2023-12-01 NOTE — ED Provider Notes (Signed)
 [place] [facility] Provider Note   CSN: [id number] Arrival date & time: 12/01/23  9183     Patient presents with: Fall, Hip Pain, Back Pain, and Rectal Pain   [name] is a 53 y.o. female with history of diabetes, prolonged QT status post internal defibrillator, COPD, lupus presents following mechanical fall standing height last night.  Denies any preceding chest pain or shortness of breath.    Fall  Hip Pain  Back Pain  Past Medical History:  Diagnosis Date   Abdominal pain, periumbilic    Allergic rhinitis    Allergy    Anemia    Anxiety    Arthritis    Asthma    Automatic implantable cardioverter-defibrillator in situ 2006, replaced 2008   [employer]   Bronchitis    Cancer (HCC)    Nodules in lungs pt believes were cancerous pt unsure   Constipation 02/08/2008   Qualifier: Diagnosis of   By: Debrah MD, Lamar BIRCH      COPD (chronic obstructive pulmonary disease) (HCC)    DM (diabetes mellitus) (HCC) 1997/1998   type 1, initially gestational but soon after child birth developed IDDM   Esophageal stricture 2005/2009   esophageal strictures dilated 2005, 2009   Eye hemorrhage    bilateral   Fibroid, uterine    Fibromyalgia    GERD (gastroesophageal reflux disease) 2005   History of blood transfusion years ago   Hypertension    Internal hemorrhoids 2010   2011 band ligation of int rrhoids.   Long Q-T syndrome    Lupus    questionable   Neuropathy in diabetes [facility])    Palpitation    Sarcoidosis    Seizure disorder (HCC)    mid June 2022 last seizure pt reported   Stroke [facility])    QUESTIONABLE TIA .  Jun 29, 2019   Past Surgical History:  Procedure Laterality Date   AUTOMATIC IMPLANTABLE CARDIAC DEFIBRILLATOR SITU  2006   ICD-Medtronic   Remote - Yes x 3   CERVIX REMOVAL     DILITATION & CURRETTAGE/HYSTROSCOPY WITH HYDROTHERMAL ABLATION N/A 06/07/2013   Procedure: DILATATION & CURETTAGE/HYSTEROSCOPY WITH  attempted HYDROTHERMAL ABLATION;  Surgeon: Aida DELENA Na, MD;  Location: [facility] ORS;  Service: Gynecology;  Laterality: N/A;   EP IMPLANTABLE DEVICE N/A 04/04/2015   Procedure:  ICD Generator Changeout;  Surgeon: Danelle LELON Birmingham, MD;  Location: [facility] INVASIVE CV LAB;  Service: Cardiovascular;  Laterality: N/A;   ESOPHAGOGASTRODUODENOSCOPY (EGD) WITH PROPOFOL  N/A 10/24/2014   Procedure: ESOPHAGOGASTRODUODENOSCOPY (EGD) WITH PROPOFOL ;  Surgeon: Lamar BIRCH Debrah, MD;  Location: [facility] ENDOSCOPY;  Service: Endoscopy;  Laterality: N/A;   EXPLORATORY LAPAROTOMY  11/06/2014   evacuation of post TAH/BSO hematoma   HAND SURGERY Right    x 2   HEMORRHOID BANDING  03/2010   SALPINGECTOMY     SVT ablation     TOTAL ABDOMINAL HYSTERECTOMY W/ BILATERAL SALPINGOOPHORECTOMY  11/06/2014   at [facility] for menorrhagia, pelvic pain and enlarging uterine fibroids   TUBAL LIGATION     VIDEO BRONCHOSCOPY Bilateral 01/24/2015   Procedure: VIDEO BRONCHOSCOPY WITHOUT FLUORO;  Surgeon: Lonna Coder, MD;  Location: [facility] ENDOSCOPY;  Service: Cardiopulmonary;  Laterality: Bilateral;       Prior to Admission medications   Medication Sig Start Date End Date Taking? Authorizing Provider  methocarbamol  (ROBAXIN ) 500 MG tablet Take 1 tablet (500 mg total) by mouth 2 (two) times daily. 12/01/23  Yes Donnajean Lynwood DEL, PA-C  acetaminophen  (TYLENOL ) 500 MG tablet Take 1,000 mg by mouth every 6 (six) hours as needed for mild pain or fever. Reported on 07/22/2015    [provider]  aspirin  EC 81 MG tablet Take 1 tablet (81 mg total) by mouth daily. 07/17/19   Georjean Darice HERO, MD  atorvastatin  (LIPITOR) 10 MG tablet Take 1 tablet (10 mg total) by mouth daily. 12/09/21   Austin Ade, MD  azelastine  (ASTELIN ) 0.1 % nasal spray Place 2 sprays into both nostrils 2 (two) times daily. Use in each nostril as directed 06/06/21   Cobb, Comer GAILS, NP  Blood Pressure Monitoring (CARETOUCH BP ARM MONITOR) DEVI Use as needed to check blood  pressure. 08/03/22   Austin Ade, MD  Camphor-Menthol -Methyl Sal (SALONPAS) 3.05-23-08 % PTCH Apply 1 patch topically daily as needed (pain).    [provider]  fluconazole  (DIFLUCAN ) 150 MG tablet Take one pill now and repeat in 3 days if symptoms persists. 06/10/23   Synthia Raisin, CNM  fluticasone  (FLOVENT  HFA) 110 MCG/ACT inhaler Inhale 2 puffs into the lungs 2 (two) times daily. 06/18/21   Cobb, Comer GAILS, NP  glucose blood (ACCU-CHEK GUIDE) test strip Check sugars four times daily 10/01/21   Austin Ade, MD  insulin  degludec (TRESIBA  FLEXTOUCH) 200 UNIT/ML FlexTouch Pen Inject 40 Units into the skin daily. 11/02/23   Elicia Hamlet, MD  Insulin  Pen Needle (NOVOFINE) 30G X 8 MM MISC Inject 10 each into the skin as needed. 07/03/22   Austin Ade, MD  lamoTRIgine  (LAMICTAL ) 150 MG tablet Take 150 mg by mouth 2 (two) times daily.    [provider]  Lancet Devices [facility]) lancets Use as instructed 10/20/16   Prentiss Dorothyann Maxwell, MD  linaclotide  (LINZESS ) 290 MCG CAPS capsule TAKE 1 CAPSULE(290 MCG) BY MOUTH DAILY BEFORE BREAKFAST 05/26/23   Zehr, Jessica D, PA-C  metFORMIN  (GLUCOPHAGE -XR) 500 MG 24 hr tablet Take 2 tablets (1,000 mg total) by mouth 2 (two) times daily. 01/06/22   Scarlet Elsie LABOR, MD  NOVOLOG  FLEXPEN 100 UNIT/ML FlexPen Inject 15 Units into the skin 2 (two) times daily before lunch and supper. 11/02/23   Elicia Hamlet, MD  pantoprazole  (PROTONIX ) 40 MG tablet Take 1 tablet (40 mg total) by mouth daily. 06/06/21   Cobb, Comer GAILS, NP  Prenat-Fe Poly-Methfol-FA-DHA (VITAFOL  ULTRA) 29-0.6-0.4-200 MG CAPS Take 1 capsule by mouth daily before breakfast. 03/26/23   Nicholaus Burnard HERO, MD  propranolol  (INDERAL ) 80 MG tablet Take 1 tablet (80 mg total) by mouth 2 (two) times daily. 05/13/23   Ursuy, Renee Lynn, PA-C  terconazole  (TERAZOL 7 ) 0.4 % vaginal cream Place 1 applicator vaginally at bedtime. 06/10/23   Synthia Raisin, CNM  valsartan  (DIOVAN ) 80 MG tablet Take 1  tablet (80 mg total) by mouth at bedtime. 08/03/22   Austin Ade, MD    Allergies: Contrast media [iodinated contrast media], Iohexol , Augmentin  [amoxicillin -pot clavulanate], Codeine, Demerol [meperidine], Morphine and codeine, and Lantus  [insulin  glargine]    Review of Systems  Musculoskeletal:  Positive for back pain.    Updated Vital Signs BP (!) 152/87 (BP Location: Left Arm)   Pulse 74   Temp 98.4 F (36.9 C) (Oral)   Resp 16   LMP 07/23/2014   SpO2 100%   Physical Exam Vitals and nursing note reviewed.  Constitutional:      General: She is not in acute distress.    Appearance: She is well-developed.  HENT:     Head: Normocephalic and atraumatic.  Eyes:  Conjunctiva/sclera: Conjunctivae normal.  Cardiovascular:     Rate and Rhythm: Normal rate and regular rhythm.     Heart sounds: No murmur heard. Pulmonary:     Effort: Pulmonary effort is normal. No respiratory distress.     Breath sounds: Normal breath sounds.  Abdominal:     Palpations: Abdomen is soft.     Tenderness: There is no abdominal tenderness.  Musculoskeletal:        General: No swelling.     Cervical back: Neck supple.  Skin:    General: Skin is warm and dry.     Capillary Refill: Capillary refill takes less than 2 seconds.  Neurological:     Mental Status: She is alert.     Comments: Patient is alert and oriented. There is no abnormal phonation. Symmetric smile without facial droop. Moves all extremities spontaneously.  . No sensation deficit. There is no nystagmus. EOMI, PERRL.   Psychiatric:        Mood and Affect: Mood normal.     (all labs ordered are listed, but only abnormal results are displayed) Labs Reviewed  CBG MONITORING, ED - Abnormal; Notable for the following components:      Result Value   Glucose-Capillary 301 (*)    All other components within normal limits    EKG: None  Radiology: CT PELVIS WO CONTRAST Result Date: 12/01/2023 CLINICAL DATA:  Trauma EXAM: CT  PELVIS WITHOUT CONTRAST TECHNIQUE: Multidetector CT imaging of the pelvis was performed following the standard protocol without intravenous contrast. RADIATION DOSE REDUCTION: This exam was performed according to the departmental dose-optimization program which includes automated exposure control, adjustment of the mA and/or kV according to patient size and/or use of iterative reconstruction technique. COMPARISON:  Left hip pain 12/01/2023. CT abdomen and pelvis 01/16/2021. FINDINGS: Urinary Tract: Bladder is distended, but otherwise within normal limits. Bowel:  Unremarkable visualized pelvic bowel loops. Vascular/Lymphatic: No significant vascular abnormality identified. There are prominent bilateral inguinal lymph nodes. Reproductive:  Status post hysterectomy. Other: There is no ascites. There is no focal abdominal wall hernia. Musculoskeletal: No acute fracture or dislocation identified. Joint spaces are maintained. No focal osseous lesion. IMPRESSION: 1. No acute fracture or dislocation identified. 2. Prominent bilateral inguinal lymph nodes, nonspecific. Electronically Signed   By: Greig Pique M.D.   On: 12/01/2023 18:11   CT Lumbar Spine Wo Contrast Result Date: 12/01/2023 EXAM: CT OF THE LUMBAR SPINE WITHOUT CONTRAST 12/01/2023 05:58:54 PM TECHNIQUE: CT of the lumbar spine was performed without the administration of intravenous contrast. Multiplanar reformatted images are provided for review. Automated exposure control, iterative reconstruction, and/or weight based adjustment of the mA/kV was utilized to reduce the radiation dose to as low as reasonably achievable. COMPARISON: Lumbar spine radiographs 10/30/2021. CLINICAL HISTORY: Back trauma, no prior imaging (Age >= 16y). FINDINGS: BONES AND ALIGNMENT: Normal vertebral body heights. No acute fracture or suspicious bone lesion. Normal alignment. DEGENERATIVE CHANGES: No significant degenerative changes. SOFT TISSUES: No acute abnormality. IMPRESSION:  1. No evidence of acute traumatic injury. Electronically signed by: Norman Gatlin MD 12/01/2023 06:08 PM EDT RP Workstation: [id number]   DG Hip Unilat W or Wo Pelvis 2-3 Views Left Result Date: 12/01/2023 CLINICAL DATA:  Fall, pain EXAM: DG HIP (WITH OR WITHOUT PELVIS) 2-3V LEFT COMPARISON:  None Available. FINDINGS: There is no evidence of displaced hip fracture or dislocation. There is no evidence of arthropathy or other focal bone abnormality. IMPRESSION: No displaced fracture of the left hip or pelvis. Electronically Signed  By: Marolyn JONETTA Jaksch M.D.   On: 12/01/2023 10:18     Procedures   Medications Ordered in the ED  acetaminophen  (TYLENOL ) tablet 1,000 mg (1,000 mg Oral Given 12/01/23 1804)                                    Medical Decision Making Amount and/or Complexity of Data Reviewed Radiology: ordered.  Risk OTC drugs.   This patient presents to the ED with chief complaint(s) of fall .  The complaint involves an extensive differential diagnosis and also carries with it a high risk of complications and morbidity.   Pertinent past medical history as listed in HPI  The differential diagnosis includes  Fracture, dislocation, sprain Additional history obtained: Records reviewed Care Everywhere/External Records  Assessment and management:   Hemodynamically stable, nontoxic-appearing patient presenting with complaints of mechanical fall standing height onto her backside.  No LOC.  Not on thinners.  Describes pain to her lower lumbar and left hip radiates down her leg, no numbness or tingling.   difficulty ambulating.  X-rays are negative.  Obtain CT imaging.  CT imaging is negative.  Patient was able to get up and ambulate.  She is a type I diabetic.  She is requesting sugar check.  CBG of 300, she is sent for discharge and will take her insulin  when she gets home.  Independent ECG interpretation:  none  Independent labs interpretation:  The following labs were  independently interpreted:  CBG 300  Independent visualization and interpretation of imaging: I independently visualized the following imaging with scope of interpretation limited to determining acute life threatening conditions related to emergency care:  Hip x-ray negative CT lumbar pelvis/hip negative  Consultations obtained:   none  Disposition:   Patient will be discharged home. The patient has been appropriately medically screened and/or stabilized in the ED. I have low suspicion for any other emergent medical condition which would require further screening, evaluation or treatment in the ED or require inpatient management. At time of discharge the patient is hemodynamically stable and in no acute distress. I have discussed work-up results and diagnosis with patient and answered all questions. Patient is agreeable with discharge plan. We discussed strict return precautions for returning to the emergency department and they verbalized understanding.     Social Determinants of Health:   none  This note was dictated with voice recognition software.  Despite best efforts at proofreading, errors may have occurred which can change the documentation meaning.       Final diagnoses:  Fall, initial encounter    ED Discharge Orders          Ordered    methocarbamol  (ROBAXIN ) 500 MG tablet  2 times daily        12/01/23 1858               Donnajean Lynwood VEAR DEVONNA 12/01/23 1920    Jerrol Lynwood, MD 12/01/23 2156

## 2023-12-01 NOTE — ED Notes (Signed)
 Patient transported to CT

## 2023-12-01 NOTE — Discharge Instructions (Addendum)
 You were evaluated in the emergency room.  Your CT imaging did not show any significant abnormality.  Prescription for Robaxin , muscle relaxer was sent into your pharmacy.  This medication can cause drowsiness so please avoid driving while taking this medication.  I would additionally recommend alternate Tylenol  and ibuprofen .  Please follow-up with your primary care doctor should your symptoms persist.

## 2023-12-01 NOTE — ED Notes (Signed)
 Pt ambulated, within her normal limits.

## 2023-12-01 NOTE — ED Triage Notes (Signed)
 Pt. Stated, I fell last night around 10 pm and I'm hurting in my back, buttocks, and left hip. I just want to make sure Im ok

## 2023-12-01 NOTE — ED Notes (Signed)
 Pt refused VS

## 2023-12-03 ENCOUNTER — Ambulatory Visit: Admitting: Family Medicine

## 2023-12-03 VITALS — BP 151/85 | HR 74 | Ht 66.0 in | Wt 177.8 lb

## 2023-12-03 DIAGNOSIS — M25552 Pain in left hip: Secondary | ICD-10-CM | POA: Diagnosis not present

## 2023-12-03 DIAGNOSIS — M533 Sacrococcygeal disorders, not elsewhere classified: Secondary | ICD-10-CM

## 2023-12-03 DIAGNOSIS — E1159 Type 2 diabetes mellitus with other circulatory complications: Secondary | ICD-10-CM | POA: Diagnosis not present

## 2023-12-03 DIAGNOSIS — I152 Hypertension secondary to endocrine disorders: Secondary | ICD-10-CM | POA: Diagnosis not present

## 2023-12-03 DIAGNOSIS — E1165 Type 2 diabetes mellitus with hyperglycemia: Secondary | ICD-10-CM | POA: Diagnosis not present

## 2023-12-03 DIAGNOSIS — Z794 Long term (current) use of insulin: Secondary | ICD-10-CM

## 2023-12-03 LAB — POCT GLYCOSYLATED HEMOGLOBIN (HGB A1C): HbA1c, POC (controlled diabetic range): 11.6 % — AB (ref 0.0–7.0)

## 2023-12-03 MED ORDER — NAPROXEN 500 MG PO TABS: 500.0000 mg | ORAL_TABLET | Freq: Two times a day (BID) | ORAL | 0 refills | Status: DC

## 2023-12-03 MED ORDER — KETOROLAC TROMETHAMINE 30 MG/ML IJ SOLN
30.0000 mg | Freq: Once | INTRAMUSCULAR | Status: AC
  Administered 2023-12-03: 30 mg via INTRAMUSCULAR

## 2023-12-03 NOTE — Patient Instructions (Signed)
 Thank you for coming in today! Here is a summary of what we discussed:  - You can take naproxen  twice a day to help with your pain.  You can also take Tylenol  in between the doses of naproxen  (spaced out by 4 to 6 hours).  - If your symptoms get worse or change, that would be reason to let us  know or be evaluated.  Please call the clinic at [phone] if your symptoms worsen or you have any concerns.  Best, Dr Adele

## 2023-12-03 NOTE — Assessment & Plan Note (Signed)
 Elevated readings x 2 today.  Pain likely contributing.  Has close follow-up with PCP scheduled for Monday.

## 2023-12-03 NOTE — Progress Notes (Addendum)
    SUBJECTIVE:   CHIEF COMPLAINT / HPI:   Follow-up after mechanical fall 7/15 -- Fell on sacrum on 7/15, states she landed on her bottom and her head fell back and hit the ground but she has not had any pain in her head  -- Evaluated in the ED on 7/16, hip x-ray, CT scan, and L-spine CT negative.  Benign neuroexam and did not have head imaging done in the ED -- Prescribed Robaxin  twice daily in the ED, states this has not helped at all -- Ongoing pain, has only been taking high-strength Tylenol  at home -- Denies neuropathic pain -- Denies history of ulcers or rectal bleeding  PERTINENT  PMH / PSH: Reviewed  OBJECTIVE:   BP (!) 151/85   Pulse 74   Ht 5' 6 (1.676 m)   Wt 177 lb 12.8 oz (80.6 kg)   LMP 07/23/2014   SpO2 100%   BMI 28.70 kg/m    Chaperone Dayshia Ottley, CMA present  General: Awake and conversant, in some distress due to pain MSK: Ambulates with cane.  Significant TTP at left lateral hip Skin: No hematomas or edema visible on bottom or bilateral thighs Neuro: Antalgic gait, no focal deficits otherwise Psych: Appropriate mood and affect   ASSESSMENT/PLAN:   Assessment & Plan Sacral pain Ongoing pain after ground-level fall at home 3 days ago.  ED imaging negative for acute fracture or abnormality.  No hematoma or other abnormality appreciated on exam.  Patient does not have history of ulcer or GI bleed.  Provided Toradol  injection in clinic and 15-day course of naproxen .  Advised Tylenol  in between doses of naproxen  at home. - ketorolac  (TORADOL ) 30 MG/ML injection 30 mg - naproxen  (NAPROSYN ) 500 MG tablet; Take 1 tablet (500 mg total) by mouth 2 (two) times daily with a meal.  Dispense: 30 tablet; Refill: 0 - Reviewed return precautions Pain of left hip Same as above Type 2 diabetes mellitus with hyperglycemia, with long-term current use of insulin  (HCC) Was due for A1c check today.  A1c elevated at 11.6.   Also due for annual [facility], patient having  mobility issues so unable to collect urine sample today. --Scheduled for PCP follow-up on Monday.  Hypertension associated with diabetes (HCC) Elevated readings x 2 today.  Pain likely contributing.  Has close follow-up with PCP scheduled for Monday.     Rea Raring, MD [facility]

## 2023-12-03 NOTE — Assessment & Plan Note (Signed)
 Was due for A1c check today.  A1c elevated at 11.6.   Also due for annual [facility], patient having mobility issues so unable to collect urine sample today. --Scheduled for PCP follow-up on Monday.

## 2023-12-06 ENCOUNTER — Ambulatory Visit (INDEPENDENT_AMBULATORY_CARE_PROVIDER_SITE_OTHER): Payer: Self-pay | Admitting: Family Medicine

## 2023-12-06 ENCOUNTER — Encounter: Payer: Self-pay | Admitting: Family Medicine

## 2023-12-06 VITALS — BP 140/85 | HR 60 | Ht 66.0 in | Wt 178.5 lb

## 2023-12-06 DIAGNOSIS — Z1231 Encounter for screening mammogram for malignant neoplasm of breast: Secondary | ICD-10-CM

## 2023-12-06 DIAGNOSIS — E1165 Type 2 diabetes mellitus with hyperglycemia: Secondary | ICD-10-CM | POA: Diagnosis present

## 2023-12-06 DIAGNOSIS — Z794 Long term (current) use of insulin: Secondary | ICD-10-CM | POA: Diagnosis not present

## 2023-12-06 LAB — GLUCOSE, POCT (MANUAL RESULT ENTRY): POC Glucose: 261 mg/dL — AB (ref 70–99)

## 2023-12-06 MED ORDER — BLOOD GLUCOSE TEST VI STRP: 1.0000 | ORAL_STRIP | Freq: Three times a day (TID) | 0 refills | Status: AC

## 2023-12-06 MED ORDER — BLOOD GLUCOSE MONITORING SUPPL DEVI: 1.0000 | Freq: Three times a day (TID) | 0 refills | Status: AC

## 2023-12-06 MED ORDER — LANCETS MISC. MISC
1.0000 | Freq: Three times a day (TID) | 0 refills | Status: AC
Start: 2023-12-06 — End: 2024-01-05

## 2023-12-06 MED ORDER — LANCET DEVICE MISC: 1.0000 | Freq: Three times a day (TID) | 0 refills | Status: AC

## 2023-12-06 NOTE — Progress Notes (Signed)
    SUBJECTIVE:   CHIEF COMPLAINT / HPI:   T2DM -Current medication regimen: Tresiba  40u daily, Novolog  sliding scale BID with meals, metformin  1000mg  BID -Home CBGs:unknown - has not had monitor for 2 weeks. Prior to 2 weeks ago reports her fasting sugar was 285 in the morning. Takes insulin  at night. -Seeing GI doctor in August for ?gastroparesis -Denies polyuria, polydipsia, abdominal pain, chest pain, shortness of breath -Foot exam: requesting podiatry referral, reports some toe numbness (chronic) and wants assistance with nail cutting -Eye exam: requesting referral  Lab Results  Component Value Date   HGBA1C 11.6 (A) 12/03/2023   HGBA1C 11.0 (A) 10/30/2022   HGBA1C 9.5 (H) 08/03/2022    PERTINENT  PMH / PSH: DM, HTN  OBJECTIVE:   BP (!) 140/85   Pulse 60   Ht 5' 6 (1.676 m)   Wt 178 lb 8 oz (81 kg)   LMP 07/23/2014   SpO2 97%   BMI 28.81 kg/m   General: NAD, pleasant, able to participate in exam Cardiac: RRR, no murmurs auscultated Respiratory: CTAB, normal WOB Abdomen: soft, non-tender, non-distended, normoactive bowel sounds Skin: warm and dry, no rashes noted Neuro: alert, no obvious focal deficits, speech normal Psych: Normal affect and mood  B/l feet: decreased sensation to sharp touch and proprioception to b/l 1st and 2nd toes distally, otherwise normal sensation and capillary refill, no wounds noted   ASSESSMENT/PLAN:   Assessment & Plan Type 2 diabetes mellitus with hyperglycemia, with long-term current use of insulin  (HCC) Fasting CBG 260s today Increase daily insulin  to 50 u Continue sliding scale Sent new Rx for glucometer F/u with Dr. Koval on 7/28 scheduled 3 mo with me Encounter for screening mammogram for malignant neoplasm of breast Ordered mammo   Payton Coward, MD [facility]

## 2023-12-06 NOTE — Patient Instructions (Addendum)
 See Dr Koval on Monday at 8:30 AM  Go up to 50 units daily on your Tresiba  (daily insulin )  Continue your sliding scale  Let us  know if you have any sugar less than 120  I will let you know if any results are abnormal from today  Please get your mammogram done

## 2023-12-06 NOTE — Assessment & Plan Note (Addendum)
 Fasting CBG 260s today Increase daily insulin  to 50 u Continue sliding scale Sent new Rx for glucometer F/u with Dr. Koval on 7/28 scheduled 3 mo with me

## 2023-12-07 LAB — MICROALBUMIN / CREATININE URINE RATIO
Creatinine, Urine: 176.7 mg/dL
Microalb/Creat Ratio: 101 mg/g{creat} — ABNORMAL HIGH (ref 0–29)
Microalbumin, Urine: 177.8 ug/mL

## 2023-12-08 ENCOUNTER — Ambulatory Visit: Attending: Cardiovascular Disease | Admitting: Internal Medicine

## 2023-12-08 ENCOUNTER — Encounter: Payer: Self-pay | Admitting: Internal Medicine

## 2023-12-08 ENCOUNTER — Ambulatory Visit: Payer: Self-pay | Admitting: Family Medicine

## 2023-12-08 VITALS — BP 138/80 | HR 66 | Ht 66.5 in | Wt 178.8 lb

## 2023-12-08 DIAGNOSIS — I472 Ventricular tachycardia, unspecified: Secondary | ICD-10-CM | POA: Diagnosis present

## 2023-12-08 LAB — CUP PACEART INCLINIC DEVICE CHECK
Date Time Interrogation Session: 20250723112510
Implantable Lead Connection Status: 753985
Implantable Lead Implant Date: 20080828
Implantable Lead Location: 753860
Implantable Lead Model: 6947
Implantable Pulse Generator Implant Date: 20161117

## 2023-12-08 NOTE — Progress Notes (Signed)
 HPI [name] returns today for followup. 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 She has sarcoidosis. She lost over 30 lbs a couple of years ago but has gained the weight back. She denies chest pain or sob. No cough or other symptoms of sarcoid. The patient has some difficulty walking to her remotely injured knee. Her daughter and grandson also have long QT. Allergies  Allergen Reactions   Contrast Media [Iodinated Contrast Media] Anaphylaxis and Shortness Of Breath    Needed to be defibrillated and patient was pre-medicated with radiology 13 hour premeds.    Iohexol  Anaphylaxis     Code: RASH, Desc: PT STATES SHE IS ALLERGIC TO IV CONTRAST 05/28/06/RM, Onset Date: [id number]    Augmentin  [Amoxicillin -Pot Clavulanate] Other (See Comments)    stomach hurt   Codeine Nausea And Vomiting    jittery   Demerol [Meperidine] Other (See Comments)    dysphoria   Morphine And Codeine Nausea And Vomiting   Lantus  [Insulin  Glargine] Other (See Comments)    Patient reports itching     Current Outpatient Medications  Medication Sig Dispense Refill   acetaminophen  (TYLENOL ) 500 MG tablet Take 1,000 mg by mouth every 6 (six) hours as needed for mild pain or fever. Reported on 07/22/2015     aspirin  EC 81 MG tablet Take 1 tablet (81 mg total) by mouth daily. 30 tablet 11   atorvastatin  (LIPITOR) 10 MG tablet Take 1 tablet (10 mg total) by mouth daily. 30 tablet 2   azelastine  (ASTELIN ) 0.1 % nasal spray Place 2 sprays into both nostrils 2 (two) times daily. Use in each nostril as directed 30 mL 12   Blood Glucose Monitoring Suppl DEVI 1 each by Does not apply route in the morning, at noon, and at bedtime. May substitute to any manufacturer covered by patient's insurance. 1 each 0   Blood Pressure Monitoring (CARETOUCH BP ARM MONITOR) DEVI Use as needed to check blood pressure. 1 each 0   Camphor-Menthol -Methyl Sal (SALONPAS) 3.05-23-08 % PTCH Apply 1 patch  topically daily as needed (pain).     fluconazole  (DIFLUCAN ) 150 MG tablet Take one pill now and repeat in 3 days if symptoms persists. 2 tablet 1   fluticasone  (FLOVENT  HFA) 110 MCG/ACT inhaler Inhale 2 puffs into the lungs 2 (two) times daily. 1 each 6   glucose blood (ACCU-CHEK GUIDE) test strip Check sugars four times daily 100 each 12   Glucose Blood (BLOOD GLUCOSE TEST STRIPS) STRP 1 each by In Vitro route in the morning, at noon, and at bedtime. May substitute to any manufacturer covered by patient's insurance. 100 strip 0   insulin  degludec (TRESIBA  FLEXTOUCH) 200 UNIT/ML FlexTouch Pen Inject 40 Units into the skin daily. 15 mL 3   Insulin  Pen Needle (NOVOFINE) 30G X 8 MM MISC Inject 10 each into the skin as needed. 100 each 5   lamoTRIgine  (LAMICTAL ) 150 MG tablet Take 150 mg by mouth 2 (two) times daily.     Lancet Device MISC 1 each by Does not apply route in the morning, at noon, and at bedtime. May substitute to any manufacturer covered by patient's insurance. 1 each 0   Lancet Devices (ACCU-CHEK SOFTCLIX) lancets Use as instructed 1 each 0   Lancets Misc. MISC 1 each by Does not apply route in the morning, at noon, and at bedtime. May substitute to any manufacturer covered by patient's insurance. 100 each  0   linaclotide  (LINZESS ) 290 MCG CAPS capsule TAKE 1 CAPSULE(290 MCG) BY MOUTH DAILY BEFORE BREAKFAST 30 capsule 1   metFORMIN  (GLUCOPHAGE -XR) 500 MG 24 hr tablet Take 2 tablets (1,000 mg total) by mouth 2 (two) times daily. 360 tablet 3   methocarbamol  (ROBAXIN ) 500 MG tablet Take 1 tablet (500 mg total) by mouth 2 (two) times daily. 10 tablet 0   naproxen  (NAPROSYN ) 500 MG tablet Take 1 tablet (500 mg total) by mouth 2 (two) times daily with a meal. 30 tablet 0   NOVOLOG  FLEXPEN 100 UNIT/ML FlexPen Inject 15 Units into the skin 2 (two) times daily before lunch and supper. 15 mL 3   pantoprazole  (PROTONIX ) 40 MG tablet Take 1 tablet (40 mg total) by mouth daily. 30 tablet 2    Prenat-Fe Poly-Methfol-FA-DHA (VITAFOL  ULTRA) 29-0.6-0.4-200 MG CAPS Take 1 capsule by mouth daily before breakfast. 30 capsule 11   propranolol  (INDERAL ) 80 MG tablet Take 1 tablet (80 mg total) by mouth 2 (two) times daily. 180 tablet 3   terconazole  (TERAZOL 7 ) 0.4 % vaginal cream Place 1 applicator vaginally at bedtime. 45 g 0   valsartan  (DIOVAN ) 80 MG tablet Take 1 tablet (80 mg total) by mouth at bedtime. 90 tablet 3   No current facility-administered medications for this visit.     Past Medical History:  Diagnosis Date   Abdominal pain, periumbilic    Allergic rhinitis    Allergy    Anemia    Anxiety    Arthritis    Asthma    Automatic implantable cardioverter-defibrillator in situ 2006, replaced 2008   MEDTRONIC   Bronchitis    Cancer (HCC)    Nodules in lungs pt believes were cancerous pt unsure   Constipation 02/08/2008   Qualifier: Diagnosis of   By: Debrah MD, Lamar BIRCH      COPD (chronic obstructive pulmonary disease) (HCC)    DM (diabetes mellitus) (HCC) 1997/1998   type 1, initially gestational but soon after child birth developed IDDM   Esophageal stricture 2005/2009   esophageal strictures dilated 2005, 2009   Eye hemorrhage    bilateral   Fibroid, uterine    Fibromyalgia    GERD (gastroesophageal reflux disease) 2005   History of blood transfusion years ago   Hypertension    Internal hemorrhoids 2010   2011 band ligation of int rrhoids.   Long Q-T syndrome    Lupus    questionable   Neuropathy in diabetes [facility])    Palpitation    Sarcoidosis    Seizure disorder (HCC)    mid June 2022 last seizure pt reported   Stroke [facility])    QUESTIONABLE TIA .  Jun 29, 2019    ROS:   All systems reviewed and negative except as noted in the HPI.   Past Surgical History:  Procedure Laterality Date   AUTOMATIC IMPLANTABLE CARDIAC DEFIBRILLATOR SITU  2006   ICD-Medtronic   Remote - Yes x 3   CERVIX REMOVAL     DILITATION & CURRETTAGE/HYSTROSCOPY WITH  HYDROTHERMAL ABLATION N/A 06/07/2013   Procedure: DILATATION & CURETTAGE/HYSTEROSCOPY WITH attempted HYDROTHERMAL ABLATION;  Surgeon: Aida DELENA Na, MD;  Location: [facility];  Service: Gynecology;  Laterality: N/A;   EP IMPLANTABLE DEVICE N/A 04/04/2015   Procedure:  ICD Generator Changeout;  Surgeon: Danelle LELON Birmingham, MD;  Location: [facility];  Service: Cardiovascular;  Laterality: N/A;   ESOPHAGOGASTRODUODENOSCOPY (EGD) WITH PROPOFOL  N/A 10/24/2014   Procedure: ESOPHAGOGASTRODUODENOSCOPY (EGD) WITH PROPOFOL ;  Surgeon: Lamar JONETTA Aho, MD;  Location: [facility];  Service: Endoscopy;  Laterality: N/A;   EXPLORATORY LAPAROTOMY  11/06/2014   evacuation of post TAH/BSO hematoma   HAND SURGERY Right    x 2   HEMORRHOID BANDING  03/2010   SALPINGECTOMY     SVT ablation     TOTAL ABDOMINAL HYSTERECTOMY W/ BILATERAL SALPINGOOPHORECTOMY  11/06/2014   at [facility]. for menorrhagia, pelvic pain and enlarging uterine fibroids   TUBAL LIGATION     VIDEO BRONCHOSCOPY Bilateral 01/24/2015   Procedure: VIDEO BRONCHOSCOPY WITHOUT FLUORO;  Surgeon: Lonna Coder, MD;  Location: [facility];  Service: Cardiopulmonary;  Laterality: Bilateral;     Family History  Problem Relation Age of Onset   Heart disease Mother    Aneurysm Mother    Diabetes Father    Positive PPD/TB Exposure Father    Glaucoma Father    Colon cancer Maternal Grandfather    Stomach cancer Paternal Grandmother    Stomach cancer Paternal Grandfather    Breast cancer Maternal Aunt        x2   Lung cancer Maternal Uncle        x2   Esophageal cancer Paternal Aunt    Stomach cancer Paternal Uncle      Social History   Socioeconomic History   Marital status: Single    Spouse name: Not on file   Number of children: 3   Years of education: Not on file   Highest education level: Not on file  Occupational History   Occupation: disabled  Tobacco Use   Smoking status: Never   Smokeless tobacco: Never  Vaping Use    Vaping status: Never Used  Substance and Sexual Activity   Alcohol use: Yes    Comment: rarely   Drug use: Never   Sexual activity: Yes    Partners: Male    Birth control/protection: Condom  Other Topics Concern   Not on file  Social History Narrative   Right handed    Social Drivers of Health   Financial Resource Strain: Not on file  Food Insecurity: Not on file  Transportation Needs: Not on file  Physical Activity: Not on file  Stress: Not on file  Social Connections: Not on file  Intimate Partner Violence: Not on file     BP 138/80   Pulse 66   Ht 5' 6.5 (1.689 m)   Wt 178 lb 12.8 oz (81.1 kg)   LMP 07/23/2014   SpO2 96%   BMI 28.43 kg/m   Physical Exam:  Well appearing 53 yo woman,  NAD HEENT: Unremarkable Neck:  No JVD, no thyromegally Lymphatics:  No adenopathy Back:  No CVA tenderness Lungs:  Clear with no wheezes HEART:  Regular rate rhythm, no murmurs, no rubs, no clicks Abd:  soft, positive bowel sounds, no organomegally, no rebound, no guarding Ext:  2 plus pulses, no edema, no cyanosis, no clubbing Skin:  No rashes no nodules Neuro:  CN II through XII intact, motor grossly intact  EKG - nsr  DEVICE  Normal device function.  See PaceArt for details.   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 I told her about driving restrictions. 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 ICD - her medtronic device is working normally. She has chronically low R waves with appropriate sensing during the VT episode. Long QT - she will continue the propranolol . Her QT is out a  little today. Sarcoidosis - she will continue her current medical therapy. She is pending pulmonary followup.   Danelle Vicente Weidler,MD

## 2023-12-08 NOTE — Patient Instructions (Signed)

## 2023-12-13 ENCOUNTER — Ambulatory Visit: Admitting: Pharmacist

## 2023-12-17 ENCOUNTER — Encounter: Payer: Self-pay | Admitting: Family Medicine

## 2023-12-17 ENCOUNTER — Encounter: Payer: Self-pay | Admitting: Pharmacist

## 2023-12-17 ENCOUNTER — Ambulatory Visit (INDEPENDENT_AMBULATORY_CARE_PROVIDER_SITE_OTHER): Admitting: Pharmacist

## 2023-12-17 ENCOUNTER — Ambulatory Visit: Admitting: Family Medicine

## 2023-12-17 VITALS — BP 124/70 | HR 79 | Ht 66.5 in | Wt 174.4 lb

## 2023-12-17 DIAGNOSIS — E1165 Type 2 diabetes mellitus with hyperglycemia: Secondary | ICD-10-CM

## 2023-12-17 DIAGNOSIS — Z794 Long term (current) use of insulin: Secondary | ICD-10-CM

## 2023-12-17 DIAGNOSIS — E119 Type 2 diabetes mellitus without complications: Secondary | ICD-10-CM

## 2023-12-17 MED ORDER — TRESIBA FLEXTOUCH 200 UNIT/ML ~~LOC~~ SOPN
50.0000 [IU] | PEN_INJECTOR | Freq: Every day | SUBCUTANEOUS | 3 refills | Status: AC
Start: 2023-12-17 — End: ?

## 2023-12-17 MED ORDER — FREESTYLE LIBRE 3 READER DEVI
1.0000 | Freq: Once | 0 refills | Status: AC
Start: 2023-12-17 — End: 2023-12-17

## 2023-12-17 MED ORDER — FREESTYLE LIBRE 3 PLUS SENSOR MISC: 11 refills | Status: AC

## 2023-12-17 MED ORDER — NOVOLOG FLEXPEN 100 UNIT/ML ~~LOC~~ SOPN: 8.0000 [IU] | PEN_INJECTOR | Freq: Three times a day (TID) | SUBCUTANEOUS | Status: AC

## 2023-12-17 NOTE — Patient Instructions (Addendum)
 I will look out for any changes made by Dr. Koval today   Be sure to schedule your mammogram

## 2023-12-17 NOTE — Progress Notes (Signed)
    S:     Chief Complaint  Patient presents with   Medication Management    Diabetes - Insulin     53 y.o. female who presents for diabetes evaluation, education, and management. Patient arrives in good spirits and presents with assistance of a cane.   Patient was referred and last seen by Primary Care Provider, Dr. Romelle, earlier today.  At last visit, basal insulin  was adjusted from 40 to 50 units daily.   PMH is significant for Sarcoid, Epilepsy, Chronic Pain, Depression.  Patient reports Diabetes was diagnosed in 2008.   Current diabetes medications include: Novolog  Flexpen (insulin  aspart) 6 units with three meals meals daily, Tresiba  (insulin  degludec) 50 units daily, metformin  XR 2000 mg daily   Patient reports adherence to taking all medications as prescribed.   Do you feel that your medications are working for you? yes Have you been experiencing any side effects to the medications prescribed? no   Patient denies hypoglycemic events.  Reported home fasting blood sugars: > 200 and many readings in the 300s  Reported checking glucose values ~ 5x per day  Patient reports nocturia (nighttime urination) - X 3X per night.  Patient reports neuropathy (nerve pain) - foot.   Patient reported dietary habits: Eats 3 meals/day   O:   Review of Systems  Genitourinary:  Positive for frequency.  Musculoskeletal:  Positive for joint pain.  Neurological:  Positive for tingling and sensory change.  All other systems reviewed and are negative.   Physical Exam Vitals reviewed.  Constitutional:      Appearance: Normal appearance.  Pulmonary:     Effort: Pulmonary effort is normal.  Neurological:     Mental Status: She is alert.  Psychiatric:        Mood and Affect: Mood normal.        Behavior: Behavior normal.        Thought Content: Thought content normal.        Judgment: Judgment normal.    Lab Results  Component Value Date   HGBA1C 11.6 (A) 12/03/2023     Lipid Panel     Component Value Date/Time   CHOL 196 10/30/2022 1718   TRIG 214 (H) 10/30/2022 1718   HDL 42 10/30/2022 1718   CHOLHDL 4.7 (H) 10/30/2022 1718   CHOLHDL 8.7 (H) 12/24/2014 1533   VLDL 47 (H) 12/24/2014 1533   LDLCALC 117 (H) 10/30/2022 1718     Patient is participating in a Managed Medicaid Plan:  Yes   A/P: Diabetes diagnosed in 2008. Patient is able to verbalize appropriate hypoglycemia management plan. Medication adherence appears good. Control is suboptimal with recent A1C of 11.6 - Continued basal insulin  Tresiba  (insulin  degludec) at 50 units daily. - Adjusted dose of rapid insulin  Novolog  (insulin  aspart) from 6 units to 8 units prior to three meals daily.  Plan to increase by 1 unit every 3 days until glucose readings are 150 or less.   -Continued metformin -XR 2000 mg daily.  -Extensively discussed pathophysiology of diabetes. - CGM plan discussed, Libre3+ with READER sent for initiation at visit in 1 week.  -Counseled on s/sx of and management of hypoglycemia.   Written patient instructions provided. Patient verbalized understanding of treatment plan.  Total time in face to face counseling 27 minutes.    Follow-up:  Pharmacist 12/22/2023 PCP clinic visit in TBD Patient seen with Fonda Blase, PharmD Candidate - PY3 student and Calton Nash, PharmD Candidate - PY4 student.

## 2023-12-17 NOTE — Assessment & Plan Note (Deleted)
 BP elevated on recheck today, was elevated at last visit as well.  Increase valsartan  to 160 mg daily and recheck BMP in 1-2 weeks, scheduled lab appointment

## 2023-12-17 NOTE — Assessment & Plan Note (Signed)
 Diabetes diagnosed in 2008. Patient is able to verbalize appropriate hypoglycemia management plan. Medication adherence appears good. Control is suboptimal with recent A1C of 11.6 - Continued basal insulin  Tresiba  (insulin  degludec) at 50 units daily. - Adjusted dose of rapid insulin  Novolog  (insulin  aspart) from 6 units to 8 units prior to three meals daily.  Plan to increase by 1 unit every 3 days until glucose readings are 150 or less.   -Continued metformin -XR 2000 mg daily.  -Extensively discussed pathophysiology of diabetes. - CGM plan discussed, Libre3+ with READER sent for initiation at visit in 1 week.  -Counseled on s/sx of and management of hypoglycemia.

## 2023-12-17 NOTE — Progress Notes (Signed)
    SUBJECTIVE:   CHIEF COMPLAINT / HPI:   T2DM f/u At last visit, fasting CBGs were uncontrolled and A1c was 11.6 Long-acting insulin  was increased to 50 units daily Still on novolog  sliding scale Patient had appointment scheduled with Dr. Koval on 7/28 but unfortunately was unable to make this  Today reports CBGs have improved a bit - now ranging 180s-220 rather than 250s/260s Has appt with Dr Amalia today at 10am   PERTINENT  PMH / PSH: T2DM, HTN  OBJECTIVE:   BP 124/70   Pulse 79   Ht 5' 6.5 (1.689 m)   Wt 174 lb 6.4 oz (79.1 kg)   LMP 07/23/2014   SpO2 97%   BMI 27.73 kg/m   General: NAD, pleasant, able to participate in exam Respiratory: No respiratory distress Skin: warm and dry, no rashes noted Psych: Normal affect and mood  ASSESSMENT/PLAN:   Assessment & Plan Type 2 diabetes mellitus with hyperglycemia, with long-term current use of insulin  (HCC) Insulin  increased from 40 units daily to 50 units daily at last visit with some improvement in fasting CBGs.  Patient has follow-up scheduled with Dr. Koval right after our visit together, will defer management to him UACR moderately elevated at last visit, discussed with patient   Payton Coward, MD [facility]

## 2023-12-17 NOTE — Assessment & Plan Note (Addendum)
 Insulin  increased from 40 units daily to 50 units daily at last visit with some improvement in fasting CBGs.  Patient has follow-up scheduled with Dr. Koval right after our visit together, will defer management to him UACR moderately elevated at last visit, discussed with patient

## 2023-12-17 NOTE — Patient Instructions (Addendum)
 It was nice to see you today!  Medication Changes: Continue Tresiba  at 50 units once daily.   Increase Novolog  (insulin  aspart) from 6 to 8 units with meals.  In 3 days increase from 8 to 9 units with meals if your glucose readings are higher than 150.   Continue all other medication the same.   Monitor blood sugars at home and keep a log (glucometer or piece of paper) to bring with you to your next visit.  Keep up the good work with diet and exercise. Aim for a diet full of vegetables, fruit and lean meats (chicken, [place], fish). Try to limit salt intake by eating fresh or frozen vegetables (instead of canned), rinse canned vegetables prior to cooking and do not add any additional salt to meals.

## 2023-12-20 ENCOUNTER — Telehealth: Payer: Self-pay

## 2023-12-20 ENCOUNTER — Other Ambulatory Visit (HOSPITAL_COMMUNITY): Payer: Self-pay

## 2023-12-20 NOTE — Progress Notes (Signed)
 Reviewed and agree with Dr Macky Lower plan.

## 2023-12-20 NOTE — Telephone Encounter (Signed)
 Pharmacy Patient Advocate Encounter   Received notification from CoverMyMeds that prior authorization for FREESTYLE LIBRE 3 PLUS SENSORS is required/requested.   Insurance verification completed.   The patient is insured through [facility] MEDICAID .   Per test claim: Patient has possible commercial coverage. [employer] on file is expired. If no commercial coverage, patient will need to contact medicaid to have this removed. Went ahead and attempted PA.   PA required; PA submitted to above mentioned insurance via CoverMyMeds Key/confirmation #/EOC [facility]. Status is pending

## 2023-12-21 ENCOUNTER — Other Ambulatory Visit: Payer: Self-pay

## 2023-12-21 MED ORDER — FLUCONAZOLE 150 MG PO TABS: 150.0000 mg | ORAL_TABLET | Freq: Once | ORAL | 0 refills | Status: AC

## 2023-12-22 ENCOUNTER — Ambulatory Visit: Admitting: Pharmacist

## 2023-12-22 ENCOUNTER — Other Ambulatory Visit (HOSPITAL_COMMUNITY): Payer: Self-pay

## 2023-12-22 NOTE — Telephone Encounter (Signed)
 When attempting test claim, rejection: submit bill to primary payor.   Patient must have a form of primary coverage. If not, she will need to speak to medicaid to have them remove this.

## 2023-12-30 ENCOUNTER — Other Ambulatory Visit (HOSPITAL_COMMUNITY): Payer: Self-pay

## 2024-01-04 ENCOUNTER — Telehealth: Payer: Self-pay

## 2024-01-04 ENCOUNTER — Encounter: Payer: Self-pay | Admitting: Nurse Practitioner

## 2024-01-04 ENCOUNTER — Ambulatory Visit (INDEPENDENT_AMBULATORY_CARE_PROVIDER_SITE_OTHER): Admitting: Nurse Practitioner

## 2024-01-04 ENCOUNTER — Other Ambulatory Visit (INDEPENDENT_AMBULATORY_CARE_PROVIDER_SITE_OTHER)

## 2024-01-04 VITALS — BP 122/70 | HR 68 | Ht 66.0 in | Wt 177.1 lb

## 2024-01-04 DIAGNOSIS — R1310 Dysphagia, unspecified: Secondary | ICD-10-CM | POA: Diagnosis not present

## 2024-01-04 DIAGNOSIS — R112 Nausea with vomiting, unspecified: Secondary | ICD-10-CM | POA: Diagnosis not present

## 2024-01-04 DIAGNOSIS — R1013 Epigastric pain: Secondary | ICD-10-CM

## 2024-01-04 DIAGNOSIS — K219 Gastro-esophageal reflux disease without esophagitis: Secondary | ICD-10-CM | POA: Diagnosis not present

## 2024-01-04 LAB — CBC WITH DIFFERENTIAL/PLATELET
Basophils Absolute: 0.1 K/uL (ref 0.0–0.1)
Basophils Relative: 2 % (ref 0.0–3.0)
Eosinophils Absolute: 0.1 K/uL (ref 0.0–0.7)
Eosinophils Relative: 1.6 % (ref 0.0–5.0)
HCT: 41.3 % (ref 36.0–46.0)
Hemoglobin: 14.3 g/dL (ref 12.0–15.0)
Lymphocytes Relative: 40.8 % (ref 12.0–46.0)
Lymphs Abs: 2.6 K/uL (ref 0.7–4.0)
MCHC: 34.5 g/dL (ref 30.0–36.0)
MCV: 84.3 fl (ref 78.0–100.0)
Monocytes Absolute: 0.5 K/uL (ref 0.1–1.0)
Monocytes Relative: 8 % (ref 3.0–12.0)
Neutro Abs: 3 K/uL (ref 1.4–7.7)
Neutrophils Relative %: 47.6 % (ref 43.0–77.0)
Platelets: 223 K/uL (ref 150.0–400.0)
RBC: 4.9 Mil/uL (ref 3.87–5.11)
RDW: 12.9 % (ref 11.5–15.5)
WBC: 6.4 K/uL (ref 4.0–10.5)

## 2024-01-04 LAB — COMPREHENSIVE METABOLIC PANEL WITH GFR
ALT: 15 U/L (ref 0–35)
AST: 16 U/L (ref 0–37)
Albumin: 4 g/dL (ref 3.5–5.2)
Alkaline Phosphatase: 94 U/L (ref 39–117)
BUN: 6 mg/dL (ref 6–23)
CO2: 30 meq/L (ref 19–32)
Calcium: 9.1 mg/dL (ref 8.4–10.5)
Chloride: 101 meq/L (ref 96–112)
Creatinine, Ser: 0.63 mg/dL (ref 0.40–1.20)
GFR: 102.13 mL/min (ref >60.00)
Glucose, Bld: 172 mg/dL — ABNORMAL HIGH (ref 70–99)
Potassium: 3.7 meq/L (ref 3.5–5.1)
Sodium: 138 meq/L (ref 135–145)
Total Bilirubin: 0.7 mg/dL (ref 0.2–1.2)
Total Protein: 7.6 g/dL (ref 6.0–8.3)

## 2024-01-04 LAB — LIPASE: Lipase: 41 U/L (ref 11.0–59.0)

## 2024-01-04 MED ORDER — FAMOTIDINE 20 MG PO TABS: 20.0000 mg | ORAL_TABLET | Freq: Every day | ORAL | 1 refills | Status: AC

## 2024-01-04 NOTE — Telephone Encounter (Signed)
 [facility] Risk Assessment -Pulmonary     Request for surgical clearance:     Endoscopy Procedure  What type of surgery is being performed?    Colon/EGD   When is this surgery scheduled?     TBD  What type of clearance is required ? Pulmonary Clearance   Are there any medications that need to be held prior to surgery and how long? None. Please advise if patient is medically cleared from Pulmonology Standpoint?    Practice name and name of physician performing surgery?      [facility]  What is your office phone and fax number?      Phone- [phone]  Fax- [phone]  Anesthesia type (None, local, MAC, general) ?       MAC   Please route your response to Blondie Barks, CMA

## 2024-01-04 NOTE — Telephone Encounter (Signed)
 Request for surgical clearance:     Endoscopy Procedure  What type of surgery is being performed?     Colon/EGD       When is this surgery scheduled?     TBD   What type of clearance is required ?  Cardiac Clearance   Are there any medications that need to be held prior to surgery and how long? none  Practice name and name of physician performing surgery?      [facility]  What is your office phone and fax number?      Phone- [phone]  Fax- [phone]  Anesthesia type (None, local, MAC, general) ?       MAC  Please route your response to Blondie Barks, CMA

## 2024-01-04 NOTE — Patient Instructions (Addendum)
 Your provider has requested that you go to the basement level for lab work before leaving today. Press B on the elevator. The lab is located at the first door on the left as you exit the elevator.  You have been scheduled for an abdominal ultrasound at [facility] (1st floor of hospital) on 01/18/24 at 7:00 am . Please arrive 15 minutes prior to your appointment for registration. Make certain not to have anything to eat or drink 6 hours prior to your appointment. Should you need to reschedule your appointment, please contact radiology at [phone]. This test typically takes about 30 minutes to perform.  You have been scheduled for a gastric emptying scan at [facility] on 01/18/24 to follow after ultrasound. Please arrive at least 15 minutes prior to your appointment for registration. Please make certain not to have anything to eat or drink after midnight the night before your test. Hold all stomach medications (ex: Zofran , phenergan, Reglan) 24 hours prior to your test. If you need to reschedule your appointment, please contact radiology scheduling at [phone]. _____________________________________________________________________ A gastric-emptying study measures how long it takes for food to move through your stomach. There are several ways to measure stomach emptying. In the most common test, you eat food that contains a small amount of radioactive material. A scanner that detects the movement of the radioactive material is placed over your abdomen to monitor the rate at which food leaves your stomach. This test normally takes about 4 hours to complete. _____________________________________________________________________    We have sent the following medications to your pharmacy for you to pick up at your convenience: Famotidine  - 1 tablet by mouth at bedtime.   Continue Protonix  40 mg once daily.   _______________________________________________________  If your blood  pressure at your visit was 140/90 or greater, please contact your primary care physician to follow up on this.  _______________________________________________________  If you are age 58 or older, your body mass index should be between 23-30. Your Body mass index is 28.59 kg/m. If this is out of the aforementioned range listed, please consider follow up with your Primary Care Provider.  If you are age 40 or younger, your body mass index should be between 19-25. Your Body mass index is 28.59 kg/m. If this is out of the aformentioned range listed, please consider follow up with your Primary Care Provider.   ________________________________________________________  The [facility] providers would like to encourage you to use MYCHART to communicate with providers for non-urgent requests or questions.  Due to long hold times on the telephone, sending your provider a message by [facility] may be a faster and more efficient way to get a response.  Please allow 48 business hours for a response.  Please remember that this is for non-urgent requests.  _______________________________________________________  [facility] is using a team-based approach to care.  Your team is made up of your doctor and two to three APPS. Our APPS (Nurse Practitioners and Physician Assistants) work with your physician to ensure care continuity for you. They are fully qualified to address your health concerns and develop a treatment plan. They communicate directly with your gastroenterologist to care for you. Seeing the Advanced Practice Practitioners on your physician's team can help you by facilitating care more promptly, often allowing for earlier appointments, access to diagnostic testing, procedures, and other specialty referrals.   Due to recent changes in healthcare laws, you may see the results of your imaging and laboratory studies on MyChart before your  provider has had a chance to review them.  We understand  that in some cases there may be results that are confusing or concerning to you. Not all laboratory results come back in the same time frame and the provider may be waiting for multiple results in order to interpret others.  Please give us  48 hours in order for your provider to thoroughly review all the results before contacting the office for clarification of your results.   Thank you for choosing me and [facility].  Elida Shawl, CRNP

## 2024-01-04 NOTE — Progress Notes (Addendum)
 01/04/2024 [name] [id number] March 16, 1971   Chief Complaint: Nausea, vomiting   History of Present Illness: [name] is a 53 y.o. female with a past medical history of hepatic and pulmonary sarcoidosis, prolonged QT syndrome status post ICD, SVT status post ablation, history of CVA 2022, seizure disorder (last absence seizure blank stare was April or May 2025), DM, fibromyalgia, neuropathy, GERD, esophageal stricture, constipation and colon polyps. She is known by Dr. Leigh. She was last seen in office by Alan 04/07/2023 due to having nausea, vomiting, early satiety, dysphagia, abdominal bloat and distension and constipation. Patient also noted having worsening SOB with exertion. She was prescribed a course of Flagyl  for abdominal gas/boat. CTAP was ordered and a gastric empty study were ordered but were not done due to issues with her health insurance. An EGD was recommended after SOB/DOE evaluated. She was referred to cardiology and pulmonology, appointments were not scheduled.   She presents today for further GI follow up. She endorses having nausea most days for the past 3 to 4 months with occasional vomiting which occurs while eating. Last vomited Thurs 8/14 while eating broccoli cheese soup and sweet potato. She continues to have upper abdominal bloat and variable upper and lower abdominal pains. She is on Pantoprazole  40mg  every day, has active acid reflux once every few weeks and dysphagia once every few weeks. Foods such as meatloaf get stuck in the throat or upper esophagus, she drinks water of ginger ale and the stuck food passes down in one or two minutes. Remote history of a distal esophageal stricture dilated in 2009. She takes Linzess  290mcg daily which results in passing a softly formed or loose stool every other day, no bloody or black stools. She drinks one bottle of Ensure three days weekly. She takes ASA 81mg  every day, infrequently takes Advil /Ibuprofen .       Latest Ref Rng & Units 04/07/2023   12:04 PM 03/15/2023    3:14 AM 02/04/2021   12:41 PM  CBC  WBC 4.0 - 10.5 K/uL 5.6   5.5   Hemoglobin 12.0 - 15.0 g/dL 85.2  86.6  86.7   Hematocrit 36.0 - 46.0 % 42.6  39.0  37.6   Platelets 150.0 - 400.0 K/uL 238.0   239        Latest Ref Rng & Units 04/07/2023   12:04 PM 03/15/2023    3:14 AM 08/17/2022   12:30 PM  CMP  Glucose 70 - 99 mg/dL 699  764  696   BUN 6 - 23 mg/dL 8  12  7    Creatinine 0.40 - 1.20 mg/dL 9.40  9.49  9.38   Sodium 135 - 145 mEq/L 136  138  136   Potassium 3.5 - 5.1 mEq/L 3.8  3.6  3.9   Chloride 96 - 112 mEq/L 99  103  98   CO2 19 - 32 mEq/L 29   24   Calcium  8.4 - 10.5 mg/dL 9.3   9.3   Total Protein 6.0 - 8.3 g/dL 7.5     Total Bilirubin 0.2 - 1.2 mg/dL 0.7     Alkaline Phos 39 - 117 U/L 109     AST 0 - 37 U/L 17     ALT 0 - 35 U/L 16      GI PROCEDURES:  COLONOSCOPY 02/18/2021: - One diminutive polyp in the cecum, removed with a cold snare. Resected and retrieved.  - One 4 mm polyp in the ascending  colon, removed with a cold snare. Resected and retrieved.  - Two diminutive polyps in the ascending colon, removed with a cold snare. Resected and retrieved.  - Internal hemorrhoids.  - The examination was otherwise normal. - 5 year recall colonoscopy  Surgical [P], colon, ascending and cecum, polyp (4) - TUBULAR ADENOMA (2 OF 4 FRAGMENTS) - BENIGN COLONIC MUCOSA (2 OF 4 FRAGMENTS) - NO HIGH-GRADE DYSPLASIA OR MALIGNANCY IDENTIFIED  COLONOSCOPY 02/17/2017: - The examined portion of the ileum was normal.  - One 6 mm polyp in the ascending colon, removed with a cold snare. Resected and retrieved.  - One 10 mm polyp at the hepatic flexure, removed with a cold snare. Resected and retrieved.  - One 3 mm polyp in the sigmoid colon, removed with a cold snare. Resected and retrieved.  - Internal hemorrhoids.  - The examination was otherwise normal. - 3 year recall colonoscopy TUBULAR ADENOMA(S). - HIGH  GRADE DYSPLASIA IS NOT IDENTIFIED  EGD secondary to N/V 10/24/2014: - Normal EGD  EGD 02/09/2008: Stricture in the distal esophagus dilated 15 - 16.5 - 18mm  Current Outpatient Medications on File Prior to Visit  Medication Sig Dispense Refill   acetaminophen  (TYLENOL ) 500 MG tablet Take 1,000 mg by mouth every 6 (six) hours as needed for mild pain or fever. Reported on 07/22/2015     aspirin  EC 81 MG tablet Take 1 tablet (81 mg total) by mouth daily. 30 tablet 11   atorvastatin  (LIPITOR) 10 MG tablet Take 1 tablet (10 mg total) by mouth daily. 30 tablet 2   azelastine  (ASTELIN ) 0.1 % nasal spray Place 2 sprays into both nostrils 2 (two) times daily. Use in each nostril as directed 30 mL 12   Blood Glucose Monitoring Suppl DEVI 1 each by Does not apply route in the morning, at noon, and at bedtime. May substitute to any manufacturer covered by patient's insurance. 1 each 0   Blood Pressure Monitoring (CARETOUCH BP ARM MONITOR) DEVI Use as needed to check blood pressure. 1 each 0   Camphor-Menthol -Methyl Sal (SALONPAS) 3.05-23-08 % PTCH Apply 1 patch topically daily as needed (pain).     fluticasone  (FLOVENT  HFA) 110 MCG/ACT inhaler Inhale 2 puffs into the lungs 2 (two) times daily. 1 each 6   glucose blood (ACCU-CHEK GUIDE) test strip Check sugars four times daily 100 each 12   Glucose Blood (BLOOD GLUCOSE TEST STRIPS) STRP 1 each by In Vitro route in the morning, at noon, and at bedtime. May substitute to any manufacturer covered by patient's insurance. 100 strip 0   insulin  degludec (TRESIBA  FLEXTOUCH) 200 UNIT/ML FlexTouch Pen Inject 50 Units into the skin daily. 15 mL 3   Insulin  Pen Needle (NOVOFINE) 30G X 8 MM MISC Inject 10 each into the skin as needed. 100 each 5   lamoTRIgine  (LAMICTAL ) 150 MG tablet Take 150 mg by mouth 2 (two) times daily.     Lancet Device MISC 1 each by Does not apply route in the morning, at noon, and at bedtime. May substitute to any manufacturer covered by patient's  insurance. 1 each 0   Lancet Devices (ACCU-CHEK SOFTCLIX) lancets Use as instructed 1 each 0   Lancets Misc. MISC 1 each by Does not apply route in the morning, at noon, and at bedtime. May substitute to any manufacturer covered by patient's insurance. 100 each 0   linaclotide  (LINZESS ) 290 MCG CAPS capsule TAKE 1 CAPSULE(290 MCG) BY MOUTH DAILY BEFORE BREAKFAST 30 capsule 1   metFORMIN  (  GLUCOPHAGE -XR) 500 MG 24 hr tablet Take 2 tablets (1,000 mg total) by mouth 2 (two) times daily. 360 tablet 3   NOVOLOG  FLEXPEN 100 UNIT/ML FlexPen Inject 8-12 Units into the skin 3 (three) times daily before meals.     pantoprazole  (PROTONIX ) 40 MG tablet Take 1 tablet (40 mg total) by mouth daily. 30 tablet 2   Prenat-Fe Poly-Methfol-FA-DHA (VITAFOL  ULTRA) 29-0.6-0.4-200 MG CAPS Take 1 capsule by mouth daily before breakfast. 30 capsule 11   propranolol  (INDERAL ) 80 MG tablet Take 1 tablet (80 mg total) by mouth 2 (two) times daily. 180 tablet 3   valsartan  (DIOVAN ) 80 MG tablet Take 1 tablet (80 mg total) by mouth at bedtime. 90 tablet 3   Continuous Glucose Sensor (FREESTYLE LIBRE 3 PLUS SENSOR) MISC Change sensor every 15 days. (Patient not taking: Reported on 01/04/2024) 2 each 11   No current facility-administered medications on file prior to visit.   Allergies  Allergen Reactions   Contrast Media [Iodinated Contrast Media] Anaphylaxis and Shortness Of Breath    Needed to be defibrillated and patient was pre-medicated with radiology 13 hour premeds.    Iohexol  Anaphylaxis     Code: RASH, Desc: PT STATES SHE IS ALLERGIC TO IV CONTRAST 05/28/06/RM, Onset Date: [id number]    Augmentin  [Amoxicillin -Pot Clavulanate] Other (See Comments)    stomach hurt   Codeine Nausea And Vomiting    jittery   Demerol [Meperidine] Other (See Comments)    dysphoria   Morphine And Codeine Nausea And Vomiting   Lantus  [Insulin  Glargine] Other (See Comments)    Patient reports itching   Current Medications, Allergies,  Past Medical History, Past Surgical History, Family History and Social History were reviewed in [employer] record.   Review of Systems:   Constitutional: Negative for fever, sweats, chills or weight loss.  Respiratory: See HPI.   Cardiovascular: Negative for chest pain, palpitations and leg swelling.  Gastrointestinal: See HPI.  Musculoskeletal: Negative for back pain or muscle aches.  Neurological: Negative for dizziness, headaches or paresthesias.   Physical Exam: Ht 5' 6 (1.676 m)   Wt 177 lb 2 oz (80.3 kg)   LMP 07/23/2014   BMI 28.59 kg/m  General: 53 year old female in no acute distress. Head: Normocephalic and atraumatic. Eyes: No scleral icterus. Conjunctiva pink . Ears: Normal auditory acuity. Mouth: No ulcers or lesions.  Lungs: Clear throughout to auscultation. Heart: Regular rate and rhythm, no murmur. Abdomen: Soft, nondistended. Epigastric and RLQ tenderness without rebound or guarding. No masses or hepatomegaly. Normal bowel sounds x 4 quadrants.  Rectal: Deferred.  Musculoskeletal: Symmetrical with no gross deformities. Extremities: No edema. Neurological: Alert oriented x 4. No focal deficits.  Psychological: Alert and cooperative. Normal mood and affect  Assessment and Recommendations:  53 year old female with chronic N/V, epigastric pain with upper abdominal bloat and early satiety -Gastric empty study to rule out gastroparesis  -RUQ sono to evaluate the gallbladder  -EGD after cardiac and pulmonary clearances received  -Await gastric empty study prior to prescribing antiemetic, limited options for antiemetic therapy secondary to history of prolonged QTC  interval -CBC, CMP and lipase level   GERD, dysphagia. Remote history of a distal esophageal stricture s/p dilatation 01/2008. Normal EGD in 2016.  -EGD benefits and risks discussed including risk with sedation, risk of bleeding, perforation and infection. EGD to be scheduled after  cardiac and pulmonary clearances received  -Continue Pantoprazole  40mg  every day -Add Famotidine  20mg  at bedtime  Chronic  constipation, fairly well controlled on Linzess  290mcg once daily -Continue Linzess  290 mcg once daily   Chronic lower abdominal pain, mild RLQ tenderness on exam (patient endorses is chronic and intermittent) -CTAP deferred for now, will re discuss CTAP if her lower abdominal pain persists or worsens -Labs as ordered above   History of colon polyps. Her most recent colonoscopy 02/2021 identified 2 tubular adenomatous polyps removed from the colon and 2 polyps were benign colonic mucosa fragments.  -Next colon polyp surveillance colonoscopy is not due until 02/2026  Hepatic and pulmonary sarcoidosis. Chronic DOE. -Patient to schedule office visit with pulmonologist, pulmonary clearance requested prior to scheduling an EGD  Chronic DOE, HX of SVT and prolonged QT interval  -Cardiac clearance required prior to scheduling EGD  CVA 2022 on ASA  DM type II on insulin  and Metformin    Absence seizures on Lamictal , last absence seizure occurred April or May 2025  ADDENDUM:  Cardiac clearance per Rosaline Bane cariology NP provided 01/04/2024 as follows: Primary Cardiologist: Danelle Birmingham, MD   Chart reviewed as part of pre-operative protocol coverage. Given past medical history and time since last visit, based on ACC/AHA guidelines, MILLA WAHLBERG would be at acceptable risk for the planned procedure without further cardiovascular testing.    Patient should contact our office if she is having new symptoms that are concerning from a cardiac perspective to arrange a follow-up appointment.     I will route this recommendation to the requesting party via Epic fax function and remove from pre-op pool.   Please call with questions.   Rosaline EMERSON Bane, NP-C  ADDENDUM: Pulmonary clearance per Dr. Praveen Mannam received 02/17/2024 as follows:  Cleared for  esophageal dilation procedure under conscious sedation with propofol  without need for additional pre-procedure testing.   Low risk 1.6% risk of in-hospital post-op pulmonary complications (composite including respiratory failure, respiratory infection, pleural effusion, atelectasis, pneumothorax, bronchospasm, aspiration pneumonitis) ForMs. Lewis, risk of perioperative pulmonary complications is increased by:             Sarcoidosis  Respiratory complications generally occur in 1% of ASA Class I patients, 5% of ASA Class II and 10% of ASA Class III-IV patients These complications rarely result in mortality and iclude postoperative pneumonia, atelectasis, pulmonary embolism, ARDS and increased time requiring postoperative mechanical ventilation.   Overall, I recommend proceeding with the surgery if the risk for respiratory complications are outweighed by the potential benefits. This will need to be discussed between the patient and surgeon.   To reduce risks of respiratory complications, I recommend: --Pre- and post-operative incentive spirometry performed frequently while awake --Avoiding use of pancuronium during anesthesia.

## 2024-01-05 ENCOUNTER — Ambulatory Visit: Payer: Self-pay | Admitting: Nurse Practitioner

## 2024-01-05 ENCOUNTER — Other Ambulatory Visit (HOSPITAL_COMMUNITY): Payer: Self-pay

## 2024-01-05 NOTE — Telephone Encounter (Signed)
 Dr. Waddell,  You saw this patient on 12/08/2023. Per protocol we request that you comment on his cardiac risk to proceed with colonoscopy and EDG scheduled for TBD since it has been less than 2 months since evaluated in the office. Please send your comment to P CV Pre-Op Pool.  Thank you, Lamarr Satterfield DNP, ANP, AACC.

## 2024-01-05 NOTE — Telephone Encounter (Signed)
 Per recent test claim: previously filled 12/29/23 however still says submit to primary coverage.   Also rec'd a letter to the office from [employer] stating patient is approved 12/20/23-06/17/24.  Will scan to patients media.

## 2024-01-05 NOTE — Progress Notes (Signed)
 Agree with assessment and plan as outlined.  Once cleared from cardiology / pulmonary can hopefully proceed with EGD

## 2024-01-06 ENCOUNTER — Ambulatory Visit

## 2024-01-07 ENCOUNTER — Telehealth: Payer: Self-pay

## 2024-01-07 ENCOUNTER — Other Ambulatory Visit (HOSPITAL_COMMUNITY): Payer: Self-pay

## 2024-01-07 NOTE — Telephone Encounter (Signed)
 Prior authorization submitted for FREESTYLE LIBRE 3 READER to [place] [employer]ICA[employer] via Latent.   Key: [id number]

## 2024-01-10 ENCOUNTER — Other Ambulatory Visit (HOSPITAL_COMMUNITY): Payer: Self-pay

## 2024-01-10 NOTE — Telephone Encounter (Signed)
   Primary Cardiologist: Danelle Birmingham, MD  Chart reviewed as part of pre-operative protocol coverage. Given past medical history and time since last visit, based on ACC/AHA guidelines, KIERSTEN COSS would be at acceptable risk for the planned procedure without further cardiovascular testing.   Patient should contact our office if she is having new symptoms that are concerning from a cardiac perspective to arrange a follow-up appointment.    I will route this recommendation to the requesting party via Epic fax function and remove from pre-op pool.  Please call with questions.  Rosaline EMERSON Bane, NP-C  01/10/2024, 7:23 AM [address], [address] [place], [place] [address] Office [phone] Fax [phone]

## 2024-01-10 NOTE — Telephone Encounter (Signed)
 Resubmitted PA via [employer].   Confirmation #[id number]

## 2024-01-13 NOTE — Telephone Encounter (Signed)
 Pharmacy Patient Advocate Encounter  Received notification from  [employer]ICA[employer] that Prior Authorization for FREESTYLE LIBRE READER has been DENIED.  No reason given; No denial letter received via Fax or CMM. It has been requested and will be uploaded to the media tab once received.  Possibly due to one Reader per lifetime.

## 2024-01-18 ENCOUNTER — Ambulatory Visit (HOSPITAL_COMMUNITY)
Admission: RE | Admit: 2024-01-18 | Discharge: 2024-01-18 | Disposition: A | Discharge status: Home / Self Care | Source: Ambulatory Visit | Attending: Nurse Practitioner | Admitting: Nurse Practitioner

## 2024-01-18 DIAGNOSIS — R112 Nausea with vomiting, unspecified: Secondary | ICD-10-CM | POA: Insufficient documentation

## 2024-01-18 DIAGNOSIS — R1013 Epigastric pain: Secondary | ICD-10-CM

## 2024-01-18 DIAGNOSIS — R1310 Dysphagia, unspecified: Secondary | ICD-10-CM | POA: Diagnosis present

## 2024-01-18 DIAGNOSIS — K219 Gastro-esophageal reflux disease without esophagitis: Secondary | ICD-10-CM | POA: Diagnosis present

## 2024-01-18 MED ORDER — TECHNETIUM TC 99M SULFUR COLLOID
2.0000 | Freq: Once | INTRAVENOUS | Status: AC | PRN
  Administered 2024-01-18: 2 via INTRAVENOUS

## 2024-01-18 NOTE — Telephone Encounter (Signed)
 Hasn't been seen in our office since 2021. Would need an OV for surgical risk assessment

## 2024-01-19 ENCOUNTER — Telehealth: Payer: Self-pay | Admitting: Pulmonary Disease

## 2024-01-19 NOTE — Telephone Encounter (Signed)
Attempted to call to schedule appointment.

## 2024-01-19 NOTE — Telephone Encounter (Signed)
 I do not see a call placed to pt recently  I called her and there was no answer- LMTCB   Note that she does have appt pending on 02/17/24

## 2024-01-19 NOTE — Telephone Encounter (Signed)
 Copied from CRM #[id number]. Topic: General - Other >> Jan 19, 2024 11:57 AM Essie A wrote: Reason for CRM: Patient is returning a call from the office.  Please call her back at [phone].  Thanks.

## 2024-01-20 NOTE — Telephone Encounter (Signed)
 Patient informed that she has been cleared by Cardiology to have endoscopic procedures. Patient advised that she needs to contact [facility] to set up office appointment.

## 2024-01-20 NOTE — Telephone Encounter (Signed)
 duplicate

## 2024-01-21 NOTE — Telephone Encounter (Signed)
 Dr Theophilus wants patient to schedule OV for surgical risk assessment. Office visit scheduled.[employer]

## 2024-01-28 ENCOUNTER — Other Ambulatory Visit

## 2024-02-07 ENCOUNTER — Ambulatory Visit (INDEPENDENT_AMBULATORY_CARE_PROVIDER_SITE_OTHER)

## 2024-02-07 ENCOUNTER — Other Ambulatory Visit (HOSPITAL_COMMUNITY)
Admission: RE | Admit: 2024-02-07 | Discharge: 2024-02-07 | Disposition: A | Discharge status: Home / Self Care | Source: Ambulatory Visit | Attending: Obstetrics | Admitting: Obstetrics

## 2024-02-07 VITALS — BP 160/82 | HR 70

## 2024-02-07 DIAGNOSIS — R102 Pelvic and perineal pain: Secondary | ICD-10-CM | POA: Insufficient documentation

## 2024-02-07 DIAGNOSIS — Z113 Encounter for screening for infections with a predominantly sexual mode of transmission: Secondary | ICD-10-CM

## 2024-02-07 NOTE — Progress Notes (Signed)
 SUBJECTIVE:  53 y.o. female who desires a STI screen. Denies abnormal vaginal discharge, bleeding or significant pelvic pain. No UTI symptoms. Denies history of known exposure to STD.  Patient's last menstrual period was 07/23/2014.  OBJECTIVE:  She appears well. BP elevated x2. Pt takes BP meds at night. (170/88 [72]; 160/82 [70])   ASSESSMENT:  STI Screen   PLAN:  Pt offered STI blood screening-requested; HIV not ordered d/t not being covered by insurance. GC, chlamydia, and trichomonas probe sent to lab.  Treatment: To be determined once lab results are received.  Pt follow up as needed.

## 2024-02-08 LAB — RPR: RPR Ser Ql: NONREACTIVE

## 2024-02-08 LAB — CERVICOVAGINAL ANCILLARY ONLY
Bacterial Vaginitis (gardnerella): NEGATIVE
Candida Glabrata: NEGATIVE
Candida Vaginitis: POSITIVE — AB
Chlamydia: NEGATIVE
Comment: NEGATIVE
Comment: NEGATIVE
Comment: NEGATIVE
Comment: NEGATIVE
Comment: NEGATIVE
Comment: NORMAL
Neisseria Gonorrhea: NEGATIVE
Trichomonas: NEGATIVE

## 2024-02-08 LAB — HEPATITIS C ANTIBODY: Hep C Virus Ab: NONREACTIVE

## 2024-02-08 LAB — HEPATITIS B SURFACE ANTIGEN: Hepatitis B Surface Ag: NEGATIVE

## 2024-02-10 ENCOUNTER — Telehealth: Payer: Self-pay

## 2024-02-10 ENCOUNTER — Other Ambulatory Visit: Payer: Self-pay

## 2024-02-10 DIAGNOSIS — B3731 Acute candidiasis of vulva and vagina: Secondary | ICD-10-CM

## 2024-02-10 MED ORDER — FLUCONAZOLE 150 MG PO TABS: 150.0000 mg | ORAL_TABLET | Freq: Once | ORAL | 0 refills | Status: AC

## 2024-02-10 NOTE — Telephone Encounter (Signed)
Diflucan sent for yeast.

## 2024-02-14 ENCOUNTER — Ambulatory Visit: Payer: Self-pay | Admitting: Obstetrics and Gynecology

## 2024-02-14 MED ORDER — FLUCONAZOLE 150 MG PO TABS
150.0000 mg | ORAL_TABLET | Freq: Once | ORAL | 0 refills | Status: AC
Start: 2024-02-14 — End: 2024-02-14

## 2024-02-15 ENCOUNTER — Ambulatory Visit (INDEPENDENT_AMBULATORY_CARE_PROVIDER_SITE_OTHER): Payer: Medicaid Other

## 2024-02-15 DIAGNOSIS — I472 Ventricular tachycardia, unspecified: Secondary | ICD-10-CM

## 2024-02-16 ENCOUNTER — Ambulatory Visit: Payer: Self-pay | Admitting: Internal Medicine

## 2024-02-16 LAB — CUP PACEART REMOTE DEVICE CHECK
Battery Remaining Longevity: 40 mo
Battery Voltage: 2.96 V
Brady Statistic RV Percent Paced: 0 %
Date Time Interrogation Session: 20250930022826
HighPow Impedance: 53 Ohm
HighPow Impedance: 86 Ohm
Implantable Lead Connection Status: 753985
Implantable Lead Implant Date: 20080828
Implantable Lead Location: 753860
Implantable Lead Model: 6947
Implantable Pulse Generator Implant Date: 20161117
Lead Channel Impedance Value: 342 Ohm
Lead Channel Impedance Value: 399 Ohm
Lead Channel Pacing Threshold Amplitude: 1.375 V
Lead Channel Pacing Threshold Pulse Width: 0.4 ms
Lead Channel Sensing Intrinsic Amplitude: 2.5 mV
Lead Channel Sensing Intrinsic Amplitude: 2.5 mV
Lead Channel Setting Pacing Amplitude: 3 V
Lead Channel Setting Pacing Pulse Width: 0.4 ms
Lead Channel Setting Sensing Sensitivity: 0.3 mV
Zone Setting Status: 755011

## 2024-02-17 ENCOUNTER — Encounter: Payer: Self-pay | Admitting: Pulmonary Disease

## 2024-02-17 ENCOUNTER — Ambulatory Visit

## 2024-02-17 ENCOUNTER — Ambulatory Visit (INDEPENDENT_AMBULATORY_CARE_PROVIDER_SITE_OTHER): Admitting: Pulmonary Disease

## 2024-02-17 VITALS — BP 142/80 | HR 67 | Temp 98.6°F | Ht 66.0 in | Wt 177.0 lb

## 2024-02-17 DIAGNOSIS — D860 Sarcoidosis of lung: Secondary | ICD-10-CM | POA: Diagnosis not present

## 2024-02-17 DIAGNOSIS — J4489 Other specified chronic obstructive pulmonary disease: Secondary | ICD-10-CM

## 2024-02-17 DIAGNOSIS — E569 Vitamin deficiency, unspecified: Secondary | ICD-10-CM

## 2024-02-17 MED ORDER — VITAFOL ULTRA 29-0.6-0.4-200 MG PO CAPS: 1.0000 | ORAL_CAPSULE | Freq: Every day | ORAL | 11 refills | Status: AC

## 2024-02-17 MED ORDER — FLUTICASONE PROPIONATE HFA 110 MCG/ACT IN AERO: 2.0000 | INHALATION_SPRAY | Freq: Two times a day (BID) | RESPIRATORY_TRACT | 6 refills | Status: AC

## 2024-02-17 NOTE — Progress Notes (Signed)
 Remote ICD Transmission

## 2024-02-17 NOTE — Patient Instructions (Signed)
  VISIT SUMMARY: Today, we reviewed your sarcoidosis, esophageal stricture, and GERD. We also discussed the need for an ophthalmology follow-up.  YOUR PLAN: SARCOIDOSIS WITH PULMONARY AND HEPATIC INVOLVEMENT: You have sarcoidosis affecting your lungs and liver, diagnosed in 2003. Currently, you have no respiratory symptoms and your liver function tests were normal in August 2025. -Order a chest X-ray today. -Schedule a CT scan in the next few months. -Schedule pulmonary function tests in six months.  ESOPHAGEAL STRICTURE: You have an esophageal stricture that is likely causing swallowing difficulties. -You are cleared for an esophageal dilation procedure under conscious sedation with propofol  without the need for additional pre-procedure testing.  GASTROESOPHAGEAL REFLUX DISEASE (GERD): You have GERD, which is being managed with daily Protonix . -Continue taking Protonix  daily.  OPHTHALMOLOGY FOLLOW-UP: You have not seen an ophthalmologist in over a year due to insurance issues and need to resume regular follow-up. -We will obtain a referral for an ophthalmology follow-up.

## 2024-02-17 NOTE — Progress Notes (Signed)
 [name]    [id number]    Oct 29, 1970  Primary Care Physician:Mahmood, Payton, MD  Referring Physician: Romelle Payton, MD [address] 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 Asthmatic bronchitis Chronic cough Ventricular tachycardia s/p AICD. Long QT syndrome Left pleural effusion April 20, 2015. Improved>> Thoracentesis >>exudative   HPI: [name] is 53 year old with stage III sarcoidosis, asthmatic bronchitis, chronic cough. She is a former patient of Dr. Brien. She was diagnosed around 2003. There is record of a transbronchial biopsy in 2003 but no granulomas were seen. She had been on and off systemic prednisone . She was admitted in September 2016 with sarcoidosis flare. Bronchial with BAL showed 10K staph aureus which was thought to be a contaminant. She was given Solu-Medrol  60 q6 and then discharged on a slow prednisone  taper starting at 60 mg. She is also being followed by cardiology for prolonged QT syndrome, family history of sudden death. Her ICD was changed in April 20, 2015.    She underwent a thoracentesis in November 16 which showed exudative left-sided predominant effusion, and cultures were negative. Repeat CXR showed improvement in her effusion. She she had a liver biopsy early 2017 that showed hepatic sarcoidosis with elevated LFTs. She's been on a prolonged steroid taper with good response and her last LFTs have normalized. We reduced her prednisone  to 30 mg in July and then to instructed her to reduce the dose to 20 mg in 19-Apr-2016. But for some reason she has actually reduced the dose to 10 mg/day. 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 She is currently on lamotrigine  and clonazepam .   01/12/2017: Prednisone  reduced to 2.5 mg daily at last visit. She is tolerating  this well. She reports chronic fatigue, dyspnea which is unchanged from before. She's had a second opinion for constant headaches at the [facility] and has been started on Topamax .                          Interim History: Discussed the use of AI scribe software for clinical note transcription with the patient, who gave verbal consent to proceed.  History of Present Illness [name] is a 53 year old female with sarcoidosis who presents for follow-up of her condition.   Pulmonary symptoms and findings - Diagnosed with sarcoidosis in 2003 with pulmonary involvement - Breathing worsens with weight gain but is currently improved - No requirement for corticosteroids for respiratory symptoms since last visit  Hepatic involvement - Diagnosed with hepatic sarcoidosis in 2003 - Liver biopsy demonstrated granulomas - Liver function tests were normal as of August 2025     Outpatient Encounter Medications as of 02/17/2024  Medication Sig   acetaminophen  (TYLENOL ) 500 MG tablet Take 1,000 mg by mouth every 6 (six) hours as needed for mild pain or fever. Reported on 07/22/2015   aspirin  EC 81 MG tablet Take 1 tablet (81 mg total) by mouth daily.   atorvastatin  (LIPITOR) 10 MG tablet Take 1 tablet (10 mg total) by mouth daily.   azelastine  (ASTELIN ) 0.1 % nasal spray Place 2 sprays into both nostrils 2 (two) times daily. Use in each nostril as directed   Blood Pressure Monitoring (CARETOUCH BP ARM MONITOR) DEVI Use as needed to check blood pressure.   Camphor-Menthol -Methyl  Sal (SALONPAS) 3.05-23-08 % PTCH Apply 1 patch topically daily as needed (pain).   Continuous Glucose Sensor (FREESTYLE LIBRE 3 PLUS SENSOR) MISC Change sensor every 15 days.   famotidine  (PEPCID ) 20 MG tablet Take 1 tablet (20 mg total) by mouth at bedtime.   glucose blood (ACCU-CHEK GUIDE) test strip Check sugars four times daily   insulin  degludec (TRESIBA  FLEXTOUCH) 200 UNIT/ML FlexTouch Pen Inject 50 Units into the  skin daily.   Insulin  Pen Needle (NOVOFINE) 30G X 8 MM MISC Inject 10 each into the skin as needed.   lamoTRIgine  (LAMICTAL ) 150 MG tablet Take 150 mg by mouth 2 (two) times daily.   Lancet Device MISC 1 each by Does not apply route in the morning, at noon, and at bedtime. May substitute to any manufacturer covered by patient's insurance.   Lancet Devices (ACCU-CHEK SOFTCLIX) lancets Use as instructed   linaclotide  (LINZESS ) 290 MCG CAPS capsule TAKE 1 CAPSULE(290 MCG) BY MOUTH DAILY BEFORE BREAKFAST   metFORMIN  (GLUCOPHAGE -XR) 500 MG 24 hr tablet Take 2 tablets (1,000 mg total) by mouth 2 (two) times daily.   NOVOLOG  FLEXPEN 100 UNIT/ML FlexPen Inject 8-12 Units into the skin 3 (three) times daily before meals.   pantoprazole  (PROTONIX ) 40 MG tablet Take 1 tablet (40 mg total) by mouth daily.   propranolol  (INDERAL ) 80 MG tablet Take 1 tablet (80 mg total) by mouth 2 (two) times daily.   valsartan  (DIOVAN ) 80 MG tablet Take 1 tablet (80 mg total) by mouth at bedtime.   Blood Glucose Monitoring Suppl DEVI 1 each by Does not apply route in the morning, at noon, and at bedtime. May substitute to any manufacturer covered by patient's insurance. (Patient not taking: Reported on 02/17/2024)   fluticasone  (FLOVENT  HFA) 110 MCG/ACT inhaler Inhale 2 puffs into the lungs 2 (two) times daily.   Prenat-Fe Poly-Methfol-FA-DHA (VITAFOL  ULTRA) 29-0.6-0.4-200 MG CAPS Take 1 capsule by mouth daily before breakfast.   [DISCONTINUED] fluticasone  (FLOVENT  HFA) 110 MCG/ACT inhaler Inhale 2 puffs into the lungs 2 (two) times daily.   [DISCONTINUED] Prenat-Fe Poly-Methfol-FA-DHA (VITAFOL  ULTRA) 29-0.6-0.4-200 MG CAPS Take 1 capsule by mouth daily before breakfast.   No facility-administered encounter medications on file as of 02/17/2024.   Vitals:   02/17/24 0951 02/17/24 1023  BP: (!) 156/80 (!) 142/80  Pulse: 67   Temp: 98.6 F (37 C)   Height: 5' 6 (1.676 m)   Weight: 177 lb (80.3 kg)   SpO2: 97%   TempSrc:  Oral   BMI (Calculated): 28.58     Physical Exam GEN: No acute distress CV: Regular rate and rhythm no murmurs LUNGS: Clear to auscultation bilaterally normal respiratory effort SKIN JOINTS: Warm and dry no rash    Data Reviewed: PFTs (08/29/14) FVC 2.98 (85%) FEV1 2.42 (85%) F/F 81   IMAGING: CT scan (01/21/15) 1. There is a spectrum of findings in the thorax that is most likely related to the patient's history of sarcoidosis. Specifically, there is a perilymphatic distribution of multiple tiny subcentimeter pulmonary nodules, in addition to mediastinal lymphadenopathy. 2. Dilatation of the pulmonic trunk (3.6 cm in diameter), suggestive of pulmonary arterial hypertension. 3. Splenomegaly. 4. Probable hepatomegaly.   CXR (03/03/15) Moderate left pleural effusion. Adjacent left lower lobe atelectasis versus infection. Possible lingular airspace disease as well. New enlargement of the cardiopericardial silhouette, which could relate to cardiomegaly and/or pericardial effusion.  CXR (06/13/15) Cardiomegaly, mild CHF, improved left effusion   CXR (07/28/16) Mild infrahilar opacity on chronic fibrotic changes suggestive  of pneumonia  I have reviewed all images personally.   LABS: Echo 01/28/15 EF 60-65%, PAP 36.   ACE level 01/22/15 - 177.      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     Latest Ref Rng & Units 01/04/2024   12:11 PM 04/07/2023   12:04 PM 01/09/2021   12:09 PM  Hepatic Function  Total Protein 6.0 - 8.3 g/dL 7.6  7.5  7.8   Albumin 3.5 - 5.2 g/dL 4.0  4.4  4.0   AST 0 - 37 U/L 16  17  21    ALT 0 - 35 U/L 15  16  22    Alk Phosphatase 39 - 117 U/L 94  109  105   Total Bilirubin 0.2 - 1.2 mg/dL 0.7  0.7  0.6   Bilirubin, Direct 0.0 - 0.3 mg/dL   0.1    Assessment & Plan Sarcoidosis with pulmonary and hepatic  involvement Sarcoidosis diagnosed in 2003 with pulmonary and hepatic involvement.  No record of granulomas on lung biopsy but liver biopsy does show granulomas. No current respiratory symptoms or need for steroids. Liver function tests in August 2025 were normal. She has not been on steroids since 2018 and is not on oxygen . - Order chest X-ray today - Schedule CT scan in the next few months - Schedule pulmonary function tests in six months  Esophageal stricture Esophageal stricture likely contributing to swallowing difficulties. Cleared for esophageal dilation procedure under conscious sedation with propofol  without need for additional pre-procedure testing.  Gastroesophageal reflux disease (GERD) GERD managed with daily Protonix . - Continue Protonix  daily  Ophthalmology follow-up She has not seen an ophthalmologist in over a year due to insurance issues and needs to resume regular follow-up. - Obtain referral for ophthalmology follow-up  Peri-operative Assessment of Pulmonary Risk for Non-Thoracic Surgery:  Asked to provide pulmonary risk assessment for planned EGD and colonoscopy  By ARISCAT criteria patient has   Low risk 1.6% risk of in-hospital post-op pulmonary complications (composite including respiratory failure, respiratory infection, pleural effusion, atelectasis, pneumothorax, bronchospasm, aspiration pneumonitis) ForMs. Lewis, risk of perioperative pulmonary complications is increased by:  Sarcoidosis  Respiratory complications generally occur in 1% of ASA Class I patients, 5% of ASA Class II and 10% of ASA Class III-IV patients These complications rarely result in mortality and iclude postoperative pneumonia, atelectasis, pulmonary embolism, ARDS and increased time requiring postoperative mechanical ventilation.  Overall, I recommend proceeding with the surgery if the risk for respiratory complications are outweighed by the potential benefits. This will need to be  discussed between the patient and surgeon.  To reduce risks of respiratory complications, I recommend: --Pre- and post-operative incentive spirometry performed frequently while awake --Avoiding use of pancuronium during anesthesia.   I have discussed the risk factors and recommendations above with the patient.   Plan/Recommendations: - High resolution CT chest  - PFT's     Lonna Coder MD  Pulmonary & Critical care See Amion for pager  If no response to pager , please call [phone] until 7pm After 7:00 pm call Elink  [phone] 02/17/2024, 1:57 PM

## 2024-02-21 NOTE — Progress Notes (Signed)
 [name], patient was cleared by Norleen Schillings CRNA regarding EGD at [facility]. Pls contact patient and schedule her for an EGD with Dr. Leigh in [facility]. THX.

## 2024-02-22 NOTE — Telephone Encounter (Signed)
 Patient was seen 02/17/2024. NFN

## 2024-02-24 NOTE — Progress Notes (Signed)
 Remote ICD Transmission

## 2024-03-02 NOTE — Telephone Encounter (Signed)
 Overall, I recommend proceeding with the surgery if the risk for respiratory complications are outweighed by the potential benefits. This will need to be discussed between the patient and surgeon.   To reduce risks of respiratory complications, I recommend: --Pre- and post-operative incentive spirometry performed frequently while awake --Avoiding use of pancuronium during anesthesia.    I have discussed the risk factors and recommendations above with the patient.       Lonna Coder MD  [facility]

## 2024-03-15 ENCOUNTER — Telehealth: Payer: Self-pay | Admitting: Nurse Practitioner

## 2024-03-15 DIAGNOSIS — R9389 Abnormal findings on diagnostic imaging of other specified body structures: Secondary | ICD-10-CM

## 2024-03-15 DIAGNOSIS — R140 Abdominal distension (gaseous): Secondary | ICD-10-CM

## 2024-03-15 DIAGNOSIS — R112 Nausea with vomiting, unspecified: Secondary | ICD-10-CM

## 2024-03-15 DIAGNOSIS — R1013 Epigastric pain: Secondary | ICD-10-CM

## 2024-03-15 DIAGNOSIS — R1310 Dysphagia, unspecified: Secondary | ICD-10-CM

## 2024-03-15 DIAGNOSIS — K219 Gastro-esophageal reflux disease without esophagitis: Secondary | ICD-10-CM

## 2024-03-15 NOTE — Telephone Encounter (Signed)
 Inbound call from patient requesting a call back to discuss scheduling EGD and colonoscopy. She states she has been waiting to hear back from our office. She states she has been having issues with her phone. Please advise.

## 2024-03-15 NOTE — Telephone Encounter (Signed)
 I have spoken to patient to advise of CT abdomen/pelvis 03/20/24 to follow her previously scheduled CT chest. I have asked that she pick up contrast between 8 am-5 pm tomorrow. She verbalizes understanding.

## 2024-03-15 NOTE — Telephone Encounter (Signed)
 See 01/05/24 lab follow up notes for previous attempts of reaching patient regarding imaging. I have spoken to patient to advise of ultrasound results showing no reason for abdominal pain, but instead showing possible cirrhosis. Discussed recommendation for additional imaging. Patient states that her ICD is not compatible with MRI as she has previously been instructed. She is also allergic to Iohexol  IV contrast used in CT. Therefore, she needs to be scheduled for CT abdomen/pelvis without IV contrast. She is agreeable to this and states that she is scheduled for chest CT on 03/20/24 at [facility] location for Dr Synthia and would like to get our scan completed same day.  I have spoken to [facility] at [place] scheduling with [facility]. She has scheduled ct abdomen/pelvis to be completed 03/20/24 at 930 am. She will need to pick up oral contrast tomorrow (Thursday) and will be given prep instructions at pick up by [facility]. Additionally, patient will need to remove glucose sensor before imagine.

## 2024-03-15 NOTE — Telephone Encounter (Signed)
 Inbound call from patient requesting a call back to discuss scheduling MRI. Please advise.

## 2024-03-16 NOTE — Telephone Encounter (Signed)
 Returned call to patient. She has been scheduled for EGD in [facility] with Dr Leigh 04/18/24. Advise that she is not due for colonoscopy until 02/2026.Instructions have been sent to patient via my chart and by mail. Patient will call back and let us  know if this date will not work with her transportation.

## 2024-03-20 ENCOUNTER — Ambulatory Visit
Admission: RE | Admit: 2024-03-20 | Discharge: 2024-03-20 | Disposition: A | Source: Ambulatory Visit | Attending: Nurse Practitioner

## 2024-03-20 ENCOUNTER — Ambulatory Visit
Admission: RE | Admit: 2024-03-20 | Discharge: 2024-03-20 | Disposition: A | Discharge status: Home / Self Care | Source: Ambulatory Visit | Attending: Pulmonary Disease | Admitting: Pulmonary Disease

## 2024-03-20 DIAGNOSIS — R1013 Epigastric pain: Secondary | ICD-10-CM

## 2024-03-20 DIAGNOSIS — R9389 Abnormal findings on diagnostic imaging of other specified body structures: Secondary | ICD-10-CM

## 2024-03-20 DIAGNOSIS — D860 Sarcoidosis of lung: Secondary | ICD-10-CM

## 2024-03-29 ENCOUNTER — Ambulatory Visit: Payer: Self-pay | Admitting: Pulmonary Disease

## 2024-04-02 ENCOUNTER — Ambulatory Visit: Payer: Self-pay | Admitting: Nurse Practitioner

## 2024-04-04 ENCOUNTER — Telehealth: Payer: Self-pay

## 2024-04-04 ENCOUNTER — Other Ambulatory Visit: Payer: Self-pay

## 2024-04-04 MED ORDER — TERCONAZOLE 0.8 % VA CREA: 1.0000 | TOPICAL_CREAM | Freq: Every day | VAGINAL | 0 refills | Status: AC

## 2024-04-04 NOTE — Telephone Encounter (Signed)
 Referral faxed to [facility] for hepatic sarcoidosis

## 2024-04-06 NOTE — Telephone Encounter (Signed)
 Patient has appointment with [facility] on 07-12-24 at 2:45pm

## 2024-04-10 ENCOUNTER — Other Ambulatory Visit (HOSPITAL_COMMUNITY)
Admission: RE | Admit: 2024-04-10 | Discharge: 2024-04-10 | Disposition: A | Discharge status: Home / Self Care | Source: Ambulatory Visit | Attending: Obstetrics and Gynecology | Admitting: Obstetrics and Gynecology

## 2024-04-10 ENCOUNTER — Ambulatory Visit

## 2024-04-10 VITALS — BP 156/84 | HR 76 | Wt 178.5 lb

## 2024-04-10 DIAGNOSIS — N898 Other specified noninflammatory disorders of vagina: Secondary | ICD-10-CM | POA: Insufficient documentation

## 2024-04-10 MED ORDER — FLUCONAZOLE 150 MG PO TABS: 150.0000 mg | ORAL_TABLET | Freq: Once | ORAL | 0 refills | Status: AC

## 2024-04-10 NOTE — Progress Notes (Signed)
..  SUBJECTIVE:  53 y.o. female complains of green and vaginal irritation/itching that has been ongoing from the last visit. Pt states that she was prescribed terconazole  and it made symptoms worse so she only completed 2 days vs 3 as prescribed. She is requesting Diflucan  today as an alternative. Advised pt that since she used terconazole  yesterday results may be altered. Denies abnormal vaginal bleeding or significant pelvic pain or fever. No UTI symptoms. Denies history of known exposure to STD.  Patient's last menstrual period was 07/23/2014.  OBJECTIVE:  She appears well, afebrile. Urine dipstick: not done.  ASSESSMENT:  Vaginal Irritation  Vaginal itching   PLAN:  GC, chlamydia, trichomonas, BVAG, CVAG probe sent to lab. Treatment: To be determined once lab results are received ROV prn if symptoms persist or worsen.

## 2024-04-11 LAB — CERVICOVAGINAL ANCILLARY ONLY
Bacterial Vaginitis (gardnerella): NEGATIVE
Candida Glabrata: NEGATIVE
Candida Vaginitis: NEGATIVE
Chlamydia: NEGATIVE
Comment: NEGATIVE
Comment: NEGATIVE
Comment: NEGATIVE
Comment: NEGATIVE
Comment: NEGATIVE
Comment: NORMAL
Neisseria Gonorrhea: NEGATIVE
Trichomonas: NEGATIVE

## 2024-04-18 ENCOUNTER — Encounter: Admitting: Gastroenterology

## 2024-05-03 ENCOUNTER — Telehealth: Payer: Self-pay | Admitting: Internal Medicine

## 2024-05-03 NOTE — Telephone Encounter (Signed)
 Patient calling to see if its okay for her to have cool sculpting done. Please advise

## 2024-05-03 NOTE — Telephone Encounter (Signed)
 Attempt to call patient regarding question if she is able to have cool sculpting done since she has an ICD. Left voicemail stating [employer] technical support number and advised patient ask if procedure would be appropriate or not.   Device clinic number left in voicemail as well for any other questions or concerns.   Will continue to monitor and update accordingly.

## 2024-05-22 ENCOUNTER — Ambulatory Visit: Attending: Cardiology

## 2024-05-22 ENCOUNTER — Telehealth: Payer: Self-pay

## 2024-05-22 ENCOUNTER — Encounter

## 2024-05-22 NOTE — Telephone Encounter (Signed)
 Patient was a no show for a video visit with Pre-visit RN for Endoscopy instructions.  No show letter will be sent to patient via mychart.

## 2024-05-22 NOTE — Telephone Encounter (Signed)
 Patient did not reschedule PV before end of day, procedure cancelled.

## 2024-05-24 LAB — CUP PACEART REMOTE DEVICE CHECK
Battery Remaining Longevity: 36 mo
Battery Voltage: 2.95 V
Brady Statistic RV Percent Paced: 0 %
Date Time Interrogation Session: 20260105204407
HighPow Impedance: 51 Ohm
HighPow Impedance: 78 Ohm
Implantable Lead Connection Status: 753985
Implantable Lead Implant Date: 20080828
Implantable Lead Location: 753860
Implantable Lead Model: 6947
Implantable Pulse Generator Implant Date: 20161117
Lead Channel Impedance Value: 342 Ohm
Lead Channel Impedance Value: 437 Ohm
Lead Channel Pacing Threshold Amplitude: 1.375 V
Lead Channel Pacing Threshold Pulse Width: 0.4 ms
Lead Channel Sensing Intrinsic Amplitude: 4.125 mV
Lead Channel Sensing Intrinsic Amplitude: 4.125 mV
Lead Channel Setting Pacing Amplitude: 3 V
Lead Channel Setting Pacing Pulse Width: 0.4 ms
Lead Channel Setting Sensing Sensitivity: 0.3 mV
Zone Setting Status: 755011

## 2024-05-27 ENCOUNTER — Ambulatory Visit: Payer: Self-pay | Admitting: Cardiology

## 2024-05-31 ENCOUNTER — Encounter: Admitting: Gastroenterology

## 2024-06-04 ENCOUNTER — Telehealth: Payer: Self-pay

## 2024-06-04 NOTE — Telephone Encounter (Signed)
 [name] alerted the overnight paging system due to an issue with her defibrillator. She tells me that she is feeling her defibrillator vibrating and states this has been occurring over the past few days, and she is feeling a little more tired than usual. She sent a remote interrogation yesterday (1/17). I called the [employer] representative who sent me the interrogation report. The device is functioning appropriately with no arrhythmic episodes. The representative also told me that the device should not vibrate if there are any issues. Rather it will make an audible noise for issues with the device. I told [name] that her device was functioning appropriately with no events or episodes. I told her that I would expedite an outpatient evaluation for her. I also gave her ED return precautions. Her OptiVol 2.0 fluid index is increased, but this is nonspecific, and she did not indicate to me any symptoms that would require urgent in person evaluation.

## 2024-06-08 ENCOUNTER — Telehealth: Payer: Self-pay | Admitting: Pharmacist

## 2024-06-08 NOTE — Telephone Encounter (Signed)
 Attempted to contact patient for follow-up of diabetes management, with intent to schedule diabetes follow-up appointment.   Left HIPAA compliant voice mail requesting call back to  My direct phone: [phone] Or Main number to reschedule: [phone]  Total time with patient call and documentation of interaction: 3 minutes.

## 2024-08-15 ENCOUNTER — Ambulatory Visit

## 2024-08-21 ENCOUNTER — Ambulatory Visit

## 2024-11-14 ENCOUNTER — Ambulatory Visit

## 2024-11-20 ENCOUNTER — Ambulatory Visit

## 2025-02-13 ENCOUNTER — Ambulatory Visit

## 2025-02-19 ENCOUNTER — Ambulatory Visit

## 2025-05-15 ENCOUNTER — Ambulatory Visit

## 2025-05-21 ENCOUNTER — Ambulatory Visit

## 2025-08-14 ENCOUNTER — Ambulatory Visit
